# Patient Record
Sex: Male | Born: 1950 | Race: White | Hispanic: No | Smoking: Former smoker
Health system: Southern US, Community
[De-identification: ages and names within clinical notes are randomized; demographics above are authoritative.]

## PROBLEM LIST (undated history)

## (undated) DIAGNOSIS — R945 Abnormal results of liver function studies: Secondary | ICD-10-CM

## (undated) DIAGNOSIS — M109 Gout, unspecified: Secondary | ICD-10-CM

## (undated) DIAGNOSIS — E291 Testicular hypofunction: Secondary | ICD-10-CM

## (undated) DIAGNOSIS — G4730 Sleep apnea, unspecified: Secondary | ICD-10-CM

## (undated) DIAGNOSIS — I10 Essential (primary) hypertension: Secondary | ICD-10-CM

## (undated) DIAGNOSIS — M1990 Unspecified osteoarthritis, unspecified site: Secondary | ICD-10-CM

## (undated) DIAGNOSIS — E119 Type 2 diabetes mellitus without complications: Secondary | ICD-10-CM

## (undated) DIAGNOSIS — R7989 Other specified abnormal findings of blood chemistry: Secondary | ICD-10-CM

## (undated) DIAGNOSIS — E785 Hyperlipidemia, unspecified: Secondary | ICD-10-CM

## (undated) DIAGNOSIS — N529 Male erectile dysfunction, unspecified: Secondary | ICD-10-CM

## (undated) DIAGNOSIS — H9122 Sudden idiopathic hearing loss, left ear: Secondary | ICD-10-CM

## (undated) HISTORY — DX: Hyperlipidemia, unspecified: E78.5

## (undated) HISTORY — DX: Abnormal results of liver function studies: R94.5

## (undated) HISTORY — DX: Essential (primary) hypertension: I10

## (undated) HISTORY — DX: Sudden idiopathic hearing loss, left ear: H91.22

## (undated) HISTORY — DX: Other specified abnormal findings of blood chemistry: R79.89

## (undated) HISTORY — DX: Unspecified osteoarthritis, unspecified site: M19.90

## (undated) HISTORY — DX: Male erectile dysfunction, unspecified: N52.9

## (undated) HISTORY — DX: Testicular hypofunction: E29.1

## (undated) HISTORY — DX: Gout, unspecified: M10.9

---

## 2003-02-03 HISTORY — PX: TOTAL KNEE ARTHROPLASTY: SHX125

## 2005-04-21 ENCOUNTER — Inpatient Hospital Stay (HOSPITAL_COMMUNITY): Admission: RE | Admit: 2005-04-21 | Discharge: 2005-04-24 | Payer: Self-pay | Admitting: Specialist

## 2008-05-02 ENCOUNTER — Encounter: Admission: RE | Admit: 2008-05-02 | Discharge: 2008-05-02 | Payer: Self-pay | Admitting: Endocrinology

## 2010-06-20 NOTE — Op Note (Signed)
NAMEACEN, CRAUN NO.:  [id number]   MEDICAL RECORD NO.:  [id number]          PATIENT TYPE:  INP   LOCATION:  0006                         FACILITY:  [facility]   PHYSICIAN:  Erasmo Leventhal, M.D.DATE OF BIRTH:  [birth year]   DATE OF PROCEDURE:  04/21/2005  DATE OF DISCHARGE:                                 OPERATIVE REPORT   PREOPERATIVE DIAGNOSIS:  Right knee end-stage osteoarthritis.   POSTOPERATIVE DIAGNOSIS:  Right knee end-stage osteoarthritis.   PROCEDURE:  Right total knee arthroplasty.   SURGEON:  Erasmo Leventhal, M.D.   ASSISTANT:  Jaquelyn Bitter. Chabon, PA-C.   ANESTHESIA:  Spinal.   ESTIMATED BLOOD LOSS:  Less than 100 mL.   DRAINS:  Two medium hemovac.   COMPLICATIONS:  None.   TOURNIQUET TIME:  1 hour and 20 minutes at 300 mmHg.   COMPLICATIONS:  None.   DISPOSITION:  PACU stable.   OPERATIVE IMPLANTS:  [employer], all cemented. Size 5 femur,  size 5 tibia, 10 mm posterior stabilized rotating platform tibial insert, a  35 mm patella.   DESCRIPTION OF PROCEDURE:  The patient was counseled in the holding area,  correct side was identified, IV was started, antibiotics were given. Taken  to the OR where the spinal anesthetic was administered. A Foley catheter was  placed utilizing sterile technique by the OR circulating nurse. All  extremities well padded and bumped. The right extremity was elevated. He had  a 7 degree flexion contracture, he could only flex to 110 degrees. He was  elevated, prepped with DuraPrep, draped in a sterile fashion. Exsanguinated  with Esmarch, tourniquet was inflated to 300 mmHg. A straight midline  incision was made in the skin and subcutaneous tissue, medial and lateral  soft tissue flaps were developed at the appropriate level. Medial  parapatellar arthrotomy was performed, proximal medial tibial soft tissue  release was done. The patella was retracted out the way, knee flexed, end-  stage arthritic changes and a very tight knee. The cruciate ligaments were  resected, a starting hole made in the distal femur, canal was irrigated,  effluent was clear. The intramedullary rod was gently placed. We chose a 5  degree valgus cut and an 11 mm cut off the distal femur. The distal femur  was found to be a size 5, rotation marks were made, distal femur was cut to  fit a size 5. Medial and lateral menisci were removed, geniculate vessels  were coagulated, posterior neurovascular structures were thought of and  protected throughout the entire case. Tibial eminence was resected, proximal  tibia was found to be a size 5, __________ was made, step reamer utilized,  canal was irrigated, effluent was clear. The intramedullary rod was gently  placed and chose a 0 degree slope with a 10 mm cut based upon the lateral  side. Posteromedial, posterolateral femoral osteophytes removed under direct  visualized this time with a flexion extension block of 10 mm and we had  excellent gaps and stability. The knee was then flexed up, tibial base plate  was applied, got good coverage.  The reamer and punch was utilized. A femoral  box cut was prepared in standard fashion. At this time with a size 5 femur,  size 5 tibia, 10 mm insert with excellent range of motion and soft tissue  balance, varus and valgus stress, flexion extension gaps. The patella was  found to be a size 35, appropriate amount of bone is removed, locking holes  were made. At this time with a 35 patella, we had anatomic patellofemoral  tracking and he looked excellent. All trials were then removed. The knee was  copiously irrigated with pulsatile lavage while the cement was mixed.  Utilizing modern cement technique, all components were cemented into place.  A size 5 tibia, size 5 femur, 35 patella with a 10 insert. After the cement  had cured, the 10 insert was removed, excess cement was removed, knee was  again irrigated with  pulsatile lavage and the final 10 mm posterior  stabilized rotating platform tibial insert was implanted. We then reduced  the knee, we had excellent varus and valgus stability, patellofemoral  tracking was anatomic and excellent flexion extension. Two medium hemovac  drains were placed, sequential closure of the layers was done, arthrotomy  Vicryl, subcu Vicryl, skin closed with a subcuticular Monocryl sutures. The  drains were hooked to suction, Steri-Strips were applied, sterile dressing  applied, tourniquet deflated, normal circulation in the foot and ankle at  the end of the case. An ice pack was applied, knee immobilizer in full  extension. Sponge and needle count correct. No complications or problems.  The patient tolerated the procedure well. He was then gently awakened and he  was taken from the operating room to the PACU in stable condition.   To help with surgical technique, retraction and decision making, Mr. Leilani Able, PA-C assistance was needed throughout the entire case.           ______________________________  Erasmo Leventhal, M.D.     RAC/MEDQ  D:  04/21/2005  T:  04/22/2005  Job:  [id number]

## 2010-06-20 NOTE — H&P (Signed)
Jimmy Valentine, Jimmy Valentine NO.:  [id number]   MEDICAL RECORD NO.:  [id number]          PATIENT TYPE:  INP   LOCATION:  NA                           FACILITY:  [facility]   PHYSICIAN:  Ellwood Dense, M.D.   DATE OF BIRTH:  May 01, 1950   DATE OF ADMISSION:  04/21/2005  DATE OF DISCHARGE:                                HISTORY & PHYSICAL   CHIEF COMPLAINT:  Right knee end-stage osteoarthritis.   HISTORY OF PRESENT ILLNESS:  This is a 60 year old gentleman with a history  of end-stage osteoarthritis of his right knee who has failed conservative  management to keep his symptoms under control. Now due to failure to  conservative treatment the patient is scheduled for total knee arthroplasty  of his right knee. The surgery risks, benefits and after care were discussed  in detail with the patient. Questions invited and answered and surgery  scheduled. He has received medical clearance from his medical physician, Dr.  Corrin Parker.   PAST MEDICAL HISTORY:   ALLERGIES:  No drug allergies.   CURRENT MEDICATIONS:  1.  Crestor 10 mg one p.o. q.p.m.  2.  Norvasc 5 mg one p.o. daily.  3.  Diovan 320 mg one p.o. daily.  4.  Maxzide 25 mg 1/2 tablet daily.   PREVIOUS SURGERY:  Right knee arthroscopy.   SERIOUS MEDICAL ILLNESSES:  Hypertension and hypercholesterolemia.   FAMILY HISTORY:  Positive for hypertension and diabetes.   SOCIAL HISTORY:  The patient is married. He works as a Medical illustrator. He does not  drink. He drinks 2 beers a day.   REVIEW OF SYSTEMS:  Negative for high blood pressure. Pulmonary - no  shortness of breath, PND, orthopnea. Cardiovascular negative for chest pain  or palpitations. GI - negative for ulcers, hepatitis. GU - negative for  urinary tract difficulty. Musculoskeletal - positive as in the history of  present illness.   PHYSICAL EXAMINATION:  VITAL SIGNS:  Blood pressure 124/88, pulse 72 and  regular, respirations 16.  GENERAL APPEARANCE:  This  is a well-developed, well-nourished gentleman in  no acute distress.  HEENT:  Head normocephalic, atraumatic. Nose patent, nares patent. Pupils  are equal, round and reactive to light. Throat without injection.  NECK:  Supple without adenopathy. Carotid 2+ without bruits.  CHEST:  Clear to auscultation, no rales, rhonchi. Respiration 16.  HEART:  Regular rate and rhythm at 72 beats per minute without murmur.  ABDOMEN:  Soft with active bowel sounds. No mass or organomegaly.  NEUROLOGIC:  Patient alert and oriented to time, place and person. Cranial  nerves II-XII grossly intact.  EXTREMITIES:  Within normal limits with exception of the right knee which  shows a 5 degree flexion contracture, further flexion to 95 degrees with  pain. He has a varus deformity. Dorsalis pedis and posterior tibialis pulses  are 2+. Sensation and circulation are intact.   X-rays show end-stage osteoarthritis of the right knee.   IMPRESSION:  End stage osteoarthritis of the right knee.   PLAN:  Total knee arthroplasty, right knee.      Jaquelyn Bitter. Chabon, P.A.  ______________________________  Ellwood Dense, M.D.    SJC/MEDQ  D:  04/17/2005  T:  04/18/2005  Job:  [id number]   cc:   Alfonse Alpers. Dagoberto Ligas, M.D.  Fax: [phone]

## 2010-06-20 NOTE — Discharge Summary (Signed)
Jimmy Valentine, Jimmy Valentine NO.:  [id number]   MEDICAL RECORD NO.:  [id number]          PATIENT TYPE:  INP   LOCATION:  1517                         FACILITY:  [facility]   PHYSICIAN:  Erasmo Leventhal, M.D.DATE OF BIRTH:  07/28/1950   DATE OF ADMISSION:  04/21/2005  DATE OF DISCHARGE:  04/24/2005                                 DISCHARGE SUMMARY   ADMITTING DIAGNOSIS:  End-stage osteoarthritis, right knee.   DISEASE DIAGNOSIS:  End-stage osteoarthritis, right knee.   OPERATION:  Total knee arthroplasty of right knee.   BRIEF HISTORY:  This is a 60 year old gentleman, well known to [place] with a  history of end-stage osteoarthritis of his right knee that has failed  conservative measures and after discussion of treatment options, now  scheduled for total knee arthroplasty.  The surgery, its benefits, and after  care were discussed in detail and the patient's questions invited and  answered.   LABORATORY VALUES:  Admission CBC within normal limits.  Admission PT/PTT  within normal limits.  His hemoglobin reached a low of 12.9 and 37.3 on the  22nd.  On the date of discharge, hemoglobin was 13.1 and hematocrit 38.9.  White count was mildly elevated back down to 11.2 at discharge.  Initial  CMET showed the AST and ALT mildly elevated at 42 and 61.  Repeats on the  23rd showed it to be within normal limits.  PT/INR on date of discharge  showed PT 15.9 with INR 1.3.   HOSPITAL COURSE:  The patient tolerated the operative procedure well.  Postoperatively and throughout his postop course, he remained afebrile.  Vital signs were stable.  First postoperative day, lungs were clear, bowel  sounds sluggish, heart sounds normal.  Drain was removed without difficulty,  and neurovascular status was intact in the foot.  The 2nd postoperative day,  dressing was changed, wound was benign, calves were negative, lungs clear,  heart sounds normal, bowel sounds sluggish.  His PCA and IV  were  discontinued, and therapy and CPM were begun.  Third postoperative day, he  was doing extremely well, had good range of motion, minimal discomfort.  Calves were negative.  Wound was benign.  Lungs were clear.  Bowel sounds  active, and he had had a bowel movement.  His INR, however, was not  responding quickly.  His PT was 15.9 with INR of 1.3.  He was started on  Lovenox 41 q.24h. subcu on the 2nd postoperative day, and he will continue  on that on outpatient basis until his INR is greater than 2.0.  And on the  3rd postoperative day, in stable condition with vital signs stable, wound  benign, calves negative, and feeling good, he was discharged home for follow  up in the office.   CONDITION ON DISCHARGE:  Improved.   DISCHARGE MEDICATIONS:  1.  Percocet 1-2 q.4-6h. p.r.n. pain.  2.  Robaxin 500, 1 p.o. q.8h. p.r.n. spasm.  3.  Coumadin per pharmacy protocol.  4.  Lovenox 40 mg subcu 1 shot q.24h. until Coumadin INR is greater than      2.0.  FOLLOW UP:  With Dr. Thomasena Edis in 2 weeks.   DISCHARGE INSTRUCTIONS:  He is to do his exercises at home and call if any  problems or questions arise.      Jaquelyn Bitter. Chabon, P.A.    ______________________________  Erasmo Leventhal, M.D.    SJC/MEDQ  D:  04/24/2005  T:  04/25/2005  Job:  [id number]

## 2010-12-04 ENCOUNTER — Encounter: Payer: Self-pay | Admitting: Internal Medicine

## 2010-12-04 ENCOUNTER — Ambulatory Visit (INDEPENDENT_AMBULATORY_CARE_PROVIDER_SITE_OTHER): Payer: 59 | Admitting: Internal Medicine

## 2010-12-04 VITALS — BP 140/92 | HR 72 | Temp 98.3°F | Resp 18 | Ht 69.0 in | Wt 253.4 lb

## 2010-12-04 DIAGNOSIS — N529 Male erectile dysfunction, unspecified: Secondary | ICD-10-CM | POA: Insufficient documentation

## 2010-12-04 DIAGNOSIS — E291 Testicular hypofunction: Secondary | ICD-10-CM | POA: Insufficient documentation

## 2010-12-04 DIAGNOSIS — Z23 Encounter for immunization: Secondary | ICD-10-CM

## 2010-12-04 DIAGNOSIS — I10 Essential (primary) hypertension: Secondary | ICD-10-CM | POA: Insufficient documentation

## 2010-12-04 DIAGNOSIS — Z0000 Encounter for general adult medical examination without abnormal findings: Secondary | ICD-10-CM | POA: Insufficient documentation

## 2010-12-04 DIAGNOSIS — E785 Hyperlipidemia, unspecified: Secondary | ICD-10-CM | POA: Insufficient documentation

## 2010-12-04 LAB — BASIC METABOLIC PANEL
Creatinine, Ser: 0.7 mg/dL (ref 0.4–1.5)
Potassium: 4.5 mEq/L (ref 3.5–5.1)

## 2010-12-04 MED ORDER — SIMVASTATIN 40 MG PO TABS: 40.0000 mg | ORAL_TABLET | Freq: Every day | ORAL | Status: DC

## 2010-12-04 MED ORDER — AMLODIPINE BESYLATE 10 MG PO TABS: 10.0000 mg | ORAL_TABLET | Freq: Every day | ORAL | Status: DC

## 2010-12-04 MED ORDER — TRIAMTERENE-HCTZ 37.5-25 MG PO TABS: 0.5000 | ORAL_TABLET | Freq: Every day | ORAL | Status: DC

## 2010-12-04 MED ORDER — SILDENAFIL CITRATE 100 MG PO TABS: 100.0000 mg | ORAL_TABLET | Freq: Every day | ORAL | Status: DC | PRN

## 2010-12-04 MED ORDER — VALSARTAN 320 MG PO TABS: 320.0000 mg | ORAL_TABLET | Freq: Every day | ORAL | Status: DC

## 2010-12-04 NOTE — Assessment & Plan Note (Addendum)
In the past tried a gel , reports it didn't get his levels up and did not corrected his mild fatigue and slt decreased libido He also tried a patch but self d/c d/t rash and back pain Told pt that sometimes fatigue and low libido are not necessarily due to low testosterone thus may not be corrected w/ HRT Plan: get records

## 2010-12-04 NOTE — Assessment & Plan Note (Signed)
On viagra prn, it works "sometimes" RF

## 2010-12-04 NOTE — Patient Instructions (Signed)
Please get records from your previous MD: labs and office visit from the last 2 years. Also XRs, procedures, colonoscopies, EKGs

## 2010-12-04 NOTE — Assessment & Plan Note (Signed)
Will get records [facility]

## 2010-12-04 NOTE — Progress Notes (Signed)
  Subjective:    Patient ID: Jimmy Valentine, male    DOB: [birth year], 60 y.o.   MRN: [id number]  HPI New patient, his previous PCP Dr Jens Som retired Hypertension, good ambulatory control, and good medication compliance. Cholesterol--good medication compliance. History of low testosterone, see assessment and plan.  Past Medical History  Diagnosis Date  . Hypertension     dx in the 82s  . Hyperlipidemia     dx in the 90s  . Hypogonadism male   . Erectile dysfunction    Past Surgical History  Procedure Date  . Replacement total knee     R , 2007   History   Social History  . Marital Status: Single    Spouse Name: N/A    Number of Children: 0  . Years of Education: N/A   Occupational History  . sales rep    Social History Main Topics  . Smoking status: Former Smoker    Quit date: 12/04/1995  . Smokeless tobacco: Never Used  . Alcohol Use: 6.0 oz/week    12 drink(s) per week     social drinker  . Drug Use: No  . Sexually Active: Not Currently   Other Topics Concern  . Not on file   Social History Narrative   Married, 2 step kids    Family History  Problem Relation Age of Onset  . Hypertension      mother   . Diabetes      sister   . Colon cancer Neg Hx   . Prostate cancer Neg Hx   . Coronary artery disease      M CABG at age 50s     Review of Systems No chest pain or shortness of breath No nausea, vomiting, diarrhea.     Objective:   Physical Exam  Constitutional: He is oriented to person, place, and time. He appears well-developed. No distress.       Central obesity noted  Cardiovascular: Normal rate, regular rhythm and normal heart sounds.   No murmur heard. Pulmonary/Chest: Effort normal and breath sounds normal. No respiratory distress. He has no wheezes. He has no rales.  Musculoskeletal: He exhibits no edema.  Neurological: He is alert and oriented to person, place, and time.  Skin: He is not diaphoretic.  Psychiatric: He has a normal mood  and affect. His behavior is normal. Judgment and thought content normal.      Assessment & Plan:

## 2010-12-04 NOTE — Assessment & Plan Note (Signed)
Seems well controlled, labs, RFs Will get records

## 2010-12-04 NOTE — Assessment & Plan Note (Signed)
Flu shot today Had a shingles shot before per pt  Never had a pneumonia shot

## 2010-12-08 ENCOUNTER — Other Ambulatory Visit: Payer: Self-pay

## 2010-12-08 MED ORDER — AMLODIPINE BESYLATE 10 MG PO TABS: 10.0000 mg | ORAL_TABLET | Freq: Every day | ORAL | Status: DC

## 2010-12-09 ENCOUNTER — Telehealth: Payer: Self-pay

## 2010-12-09 NOTE — Telephone Encounter (Signed)
Left message concerning lab results also left a call back number to be contacted.

## 2010-12-09 NOTE — Telephone Encounter (Signed)
Message copied by Francisco Capuchin on Tue Dec 09, 2010  9:55 AM ------      Message from: Willow Ora E      Created: Sun Dec 07, 2010 10:48 AM       Non fasting labs, advise patient:       K + normal , good results

## 2010-12-10 ENCOUNTER — Telehealth: Payer: Self-pay

## 2010-12-10 NOTE — Telephone Encounter (Signed)
Message copied by Beverely Low on Wed Dec 10, 2010  8:56 AM ------      Message from: Jimmy Valentine      Created: Sun Dec 07, 2010 10:48 AM       Non fasting labs, advise patient:       K + normal , good results

## 2010-12-10 NOTE — Telephone Encounter (Signed)
Left message on personally identified voicemail to notify pt labs normal and copy mailed. Advised pt to call with questions or concerns

## 2011-01-14 ENCOUNTER — Telehealth: Payer: Self-pay | Admitting: Internal Medicine

## 2011-01-14 NOTE — Telephone Encounter (Signed)
A number of old records were reviewed today. History of abnormal LFTs on and off, levels varies from the 70s to normal. I don't see an abdominal ultrasound or hepatitis serology. History of   hypogonadism. MRI of the brain 2010 normal. On HRT.

## 2011-03-06 ENCOUNTER — Ambulatory Visit (INDEPENDENT_AMBULATORY_CARE_PROVIDER_SITE_OTHER): Payer: 59 | Admitting: Internal Medicine

## 2011-03-06 ENCOUNTER — Encounter: Payer: Self-pay | Admitting: Internal Medicine

## 2011-03-06 DIAGNOSIS — R5383 Other fatigue: Secondary | ICD-10-CM

## 2011-03-06 DIAGNOSIS — Z0000 Encounter for general adult medical examination without abnormal findings: Secondary | ICD-10-CM

## 2011-03-06 DIAGNOSIS — E291 Testicular hypofunction: Secondary | ICD-10-CM

## 2011-03-06 NOTE — Progress Notes (Signed)
  Subjective:    Patient ID: Jimmy Valentine, male    DOB: 06/04/1950, 61 y.o.   MRN: [id number]  HPI Complete physical exam  Past medical history Hypertension dx in the 41 Hyperlipidemia dx in the 90s increase LFTs on and off, mild Secondary hypogonadism,had a MRI, was prescribed testosterone by endocrinology erectal dysfunction  Past surgical history Right total knee replacement 2007  Social history Married, 2 stepchildren Occupation Tax adviser Quit tobacco 1997 Alcohol socially No drug use  Family history Hypertension-- mother Diabetes -- sister. Heart disease -- had a CABG and age 59 Colon cancer --no Prostate cancer-- no   Review of Systems No fever or chills No chest pain or shortness of breath No nausea, vomiting, diarrhea. No blood in the stools. occ  nocturia without gross hematuria. No anxiety depression. Wonders about sleep apnea, he snores and wakes up frequently  but denies falling asleep inappropriately. He does feel quite fatigued  Continue with the present needle on     Objective:   Physical Exam  Constitutional: He is oriented to person, place, and time. He appears well-developed. No distress.       Central obesity  Neck: No thyromegaly present.  Cardiovascular: Normal rate, regular rhythm, normal heart sounds and intact distal pulses.   No murmur heard. Pulmonary/Chest: Effort normal and breath sounds normal. No respiratory distress. He has no wheezes. He has no rales.  Abdominal: Soft. Bowel sounds are normal. He exhibits no distension. There is no tenderness. There is no rebound and no guarding.  Genitourinary: Rectum normal and prostate normal.  Musculoskeletal: He exhibits no edema.  Neurological: He is alert and oriented to person, place, and time.  Skin: He is not diaphoretic.  Psychiatric: He has a normal mood and affect. His behavior is normal. Judgment and thought content normal.      Assessment & Plan:

## 2011-03-06 NOTE — Assessment & Plan Note (Signed)
Chart reviewed, his endocrinologist diagnosed with with secondary hypogonadism based on [facility], LH and brain MRI. Was prescribed testosterone. In the past tried a gel , reports it didn't get his levels up and did not corrected his mild fatigue and slt decreased libido He also tried a patch but self d/c d/t rash and back pain Plan: labs

## 2011-03-06 NOTE — Assessment & Plan Note (Addendum)
Last TD several years ago Had a Flu shot   2011 shingles shot   2011 pneumonia shot  Cscope at age 61, normal, Dr Candelaria Stagers, next 10 years. iFOB today Wonders about OSA, he has quite a bit of fatigue. Will test Labs Diet-exercise discussed

## 2011-03-06 NOTE — Patient Instructions (Signed)
Please come fasting: FLP, CMP, TSH, CBC, TSH--- dx V70  total and free testosterone--- dx hypogonadism

## 2011-03-10 ENCOUNTER — Other Ambulatory Visit: Payer: Self-pay | Admitting: Internal Medicine

## 2011-03-10 DIAGNOSIS — Z0000 Encounter for general adult medical examination without abnormal findings: Secondary | ICD-10-CM

## 2011-03-10 DIAGNOSIS — E291 Testicular hypofunction: Secondary | ICD-10-CM

## 2011-03-11 ENCOUNTER — Encounter: Payer: Self-pay | Admitting: *Deleted

## 2011-03-11 ENCOUNTER — Other Ambulatory Visit (INDEPENDENT_AMBULATORY_CARE_PROVIDER_SITE_OTHER): Payer: 59

## 2011-03-11 DIAGNOSIS — E291 Testicular hypofunction: Secondary | ICD-10-CM

## 2011-03-11 DIAGNOSIS — Z0000 Encounter for general adult medical examination without abnormal findings: Secondary | ICD-10-CM

## 2011-03-11 LAB — LIPID PANEL
LDL Cholesterol: 90 mg/dL (ref 0–99)
Total CHOL/HDL Ratio: 4
Triglycerides: 132 mg/dL (ref 0.0–149.0)
VLDL: 26.4 mg/dL (ref 0.0–40.0)

## 2011-03-11 LAB — COMPREHENSIVE METABOLIC PANEL
ALT: 39 U/L (ref 0–53)
BUN: 23 mg/dL (ref 6–23)
CO2: 26 mEq/L (ref 19–32)
Calcium: 9.4 mg/dL (ref 8.4–10.5)
Creatinine, Ser: 1.1 mg/dL (ref 0.4–1.5)
GFR: 68.94 mL/min (ref >60.00)
Glucose, Bld: 125 mg/dL — ABNORMAL HIGH (ref 70–99)
Sodium: 140 mEq/L (ref 135–145)
Total Bilirubin: 1 mg/dL (ref 0.3–1.2)
Total Protein: 7.4 g/dL (ref 6.0–8.3)

## 2011-03-11 LAB — CBC WITH DIFFERENTIAL/PLATELET
Basophils Absolute: 0 10*3/uL (ref 0.0–0.1)
Eosinophils Relative: 3.9 % (ref 0.0–5.0)
Lymphs Abs: 2.6 10*3/uL (ref 0.7–4.0)
MCHC: 35.2 g/dL (ref 30.0–36.0)
MCV: 96.3 fl (ref 78.0–100.0)
Monocytes Absolute: 1 10*3/uL (ref 0.1–1.0)
Neutro Abs: 6.9 10*3/uL (ref 1.4–7.7)
WBC: 10.9 10*3/uL — ABNORMAL HIGH (ref 4.5–10.5)

## 2011-03-11 LAB — TSH: TSH: 2.58 u[IU]/mL (ref 0.35–5.50)

## 2011-03-12 ENCOUNTER — Other Ambulatory Visit: Payer: Self-pay | Admitting: Internal Medicine

## 2011-03-12 DIAGNOSIS — G4733 Obstructive sleep apnea (adult) (pediatric): Secondary | ICD-10-CM

## 2011-03-12 LAB — TESTOSTERONE, FREE, TOTAL, SHBG: Testosterone: 151.47 ng/dL — ABNORMAL LOW (ref 250–890)

## 2011-03-12 NOTE — Progress Notes (Signed)
Addended by: Edwena Felty T on: 03/12/2011 10:37 AM   Modules accepted: Orders

## 2011-03-18 ENCOUNTER — Telehealth: Payer: Self-pay | Admitting: *Deleted

## 2011-03-18 NOTE — Telephone Encounter (Signed)
Pt had labs on 03/11/11 and has not heard from our office regarding results.  No notes attached to labs with recommendations.  Please advise recommendations and if OK to mail. Thanks

## 2011-03-19 MED ORDER — TESTOSTERONE 40.5 MG/2.5GM (1.62%) TD GEL: 2.0000 | Freq: Every day | TRANSDERMAL | Status: DC

## 2011-03-19 NOTE — Telephone Encounter (Signed)
Spoke with pt, gave lab results & made appt for 4.15.13 @ 11am. Sent in prescription.

## 2011-03-19 NOTE — Telephone Encounter (Signed)
Pt just called back & states that he still has some of the testosterone patches left over. Can he use those or would you prefer the Androgel? Please advise.

## 2011-03-19 NOTE — Telephone Encounter (Signed)
LMOVM to return call.

## 2011-03-19 NOTE — Telephone Encounter (Signed)
Advise patient, Cholesterol is very good, blood sugar and white cells are slightly elevated.Testosterone is low. Plan:  Restart AndroGel use it early every AM, see prescription Come back for blood work in 2 months,blood tests needs to be done at around 11 AM: Total and free testosterone---- DX hypogonadism A1c ----DX hyperglycemia

## 2011-03-20 NOTE — Telephone Encounter (Signed)
rec to try androgel, give pt a discount card if available

## 2011-03-20 NOTE — Telephone Encounter (Signed)
Spoke with pt & sent lab results upon request.

## 2011-03-24 ENCOUNTER — Other Ambulatory Visit: Payer: Self-pay | Admitting: *Deleted

## 2011-03-24 MED ORDER — TESTOSTERONE 40.5 MG/2.5GM (1.62%) TD GEL: 2.0000 | Freq: Every day | TRANSDERMAL | Status: DC

## 2011-03-24 NOTE — Telephone Encounter (Signed)
Rx sent 

## 2011-03-26 ENCOUNTER — Other Ambulatory Visit: Payer: Self-pay | Admitting: Internal Medicine

## 2011-03-26 DIAGNOSIS — G4730 Sleep apnea, unspecified: Secondary | ICD-10-CM

## 2011-03-26 DIAGNOSIS — R5383 Other fatigue: Secondary | ICD-10-CM

## 2011-03-27 ENCOUNTER — Other Ambulatory Visit: Payer: Self-pay | Admitting: Internal Medicine

## 2011-03-31 ENCOUNTER — Telehealth: Payer: Self-pay | Admitting: *Deleted

## 2011-03-31 NOTE — Telephone Encounter (Signed)
Prior auth denied for Androgel preferred med Testim.Please advise

## 2011-04-02 NOTE — Telephone Encounter (Signed)
Left message to call office

## 2011-04-02 NOTE — Telephone Encounter (Signed)
I put a prescription for Testim, please complete the prescription and send it to whatever pharmacy he desires. Come back for blood work in 2 months,blood tests needs to be done at around 11 AM:  Total and free testosterone---- DX hypogonadism  A1c ----DX hyperglycemia

## 2011-04-03 ENCOUNTER — Telehealth: Payer: Self-pay | Admitting: Internal Medicine

## 2011-04-03 ENCOUNTER — Ambulatory Visit (INDEPENDENT_AMBULATORY_CARE_PROVIDER_SITE_OTHER): Payer: 59 | Admitting: Pulmonary Disease

## 2011-04-03 DIAGNOSIS — G4733 Obstructive sleep apnea (adult) (pediatric): Secondary | ICD-10-CM

## 2011-04-03 MED ORDER — TESTOSTERONE 50 MG/5GM (1%) TD GEL: 5.0000 g | Freq: Every day | TRANSDERMAL | Status: DC

## 2011-04-03 NOTE — Telephone Encounter (Signed)
Addended by: Candie Echevaria L on: 04/03/2011 08:51 AM   Modules accepted: Orders

## 2011-04-03 NOTE — Telephone Encounter (Signed)
Discuss with patient, Rx sent. 

## 2011-04-03 NOTE — Telephone Encounter (Signed)
Pt returned your call. Ph # [phone]

## 2011-04-03 NOTE — Telephone Encounter (Signed)
Verbally clarified Rx on phone with pharmacy.

## 2011-04-03 NOTE — Telephone Encounter (Signed)
[facility] faxed a clarification request for the prescription below:  testosterone (ANDROGEL) 50 MG/5GM GEL 5 Package 2 04/02/2011 04/03/2011 Sig - Route: Place 5 g onto the skin daily. - Transdermal Class: Print Authorizing Provider: Wanda Plump, MD Ordering User: Candie Echevaria, CMA  Needs to clarfiy package size is 30 tubes of 5-grams  Please send new RX to [phone]

## 2011-04-06 NOTE — Progress Notes (Signed)
Addended by: Edwena Felty T on: 04/06/2011 04:36 PM   Modules accepted: Orders

## 2011-04-22 ENCOUNTER — Encounter: Payer: Self-pay | Admitting: Pulmonary Disease

## 2011-04-22 ENCOUNTER — Ambulatory Visit (INDEPENDENT_AMBULATORY_CARE_PROVIDER_SITE_OTHER): Payer: 59 | Admitting: Pulmonary Disease

## 2011-04-22 VITALS — BP 152/70 | HR 81 | Temp 98.5°F | Ht 69.0 in | Wt 254.0 lb

## 2011-04-22 DIAGNOSIS — G4733 Obstructive sleep apnea (adult) (pediatric): Secondary | ICD-10-CM

## 2011-04-22 NOTE — Progress Notes (Signed)
  Subjective:    Patient ID: Jimmy Valentine, male    DOB: 1950-09-20, 61 y.o.   MRN: [id number]  HPI The patient is a 61 year old male who been asked to see for management of obstructive sleep apnea.  He recently underwent sleep testing where he was found to have an AHI of 11 events per hour based on a total monitoring time of 9 hours.  However, the patient tells me that he slept approximately 7 hours a night, giving him an actual AHI of 14 events per hour.  He has been noted to have loud snoring as well as an abnormal breathing pattern during sleep by his wife.  He does have frequent awakenings at night, but feels fairly rested in the mornings upon arising.  He denies any alert and is concentration issues during the day, as well as inappropriate daytime sleepiness.  He has no issues watching television or movies in the evening because of sleepiness.  He denies any sleepiness with driving, and drives long distances weekly.  The patient states that his weight is neutral over the last 2 years.  Sleep Questionnaire: What time do you typically go to bed?( Between what hours) 8:30 to 10:00 pm How long does it take you to fall asleep? 15 mins to 1 hour How many times during the night do you wake up? What time do you get out of bed to start your day? 0530 Do you drive or operate heavy machinery in your occupation? No How much has your weight changed (up or down) over the past two years? (In pounds) 0 oz (0 kg) Have you ever had a sleep study before? Yes If yes, location of study? Home sleep study If yes, date of study? 03/2011 Do you currently use CPAP? No Do you wear oxygen at any time? No    Review of Systems  Constitutional: Negative for fever and unexpected weight change.  HENT: Negative for ear pain, nosebleeds, congestion, sore throat, rhinorrhea, sneezing, trouble swallowing, dental problem, postnasal drip and sinus pressure.   Eyes: Negative for redness and itching.  Respiratory: Negative for cough,  chest tightness, shortness of breath and wheezing.   Cardiovascular: Negative for palpitations and leg swelling.  Gastrointestinal: Negative for nausea and vomiting.  Genitourinary: Negative for dysuria.  Musculoskeletal: Negative for joint swelling.  Skin: Negative for rash.  Neurological: Negative for headaches.  Hematological: Does not bruise/bleed easily.  Psychiatric/Behavioral: Negative for dysphoric mood. The patient is not nervous/anxious.        Objective:   Physical Exam Constitutional:  Obese male, no acute distress  HENT:  Nares patent without discharge, some turbinate hypertrophy  Oropharynx without exudate, palate and uvula are thick and elongated.   Eyes:  Perrla, eomi, no scleral icterus  Neck:  No JVD, no TMG  Cardiovascular:  Normal rate, regular rhythm, no rubs or gallops.  1/6 sem        Intact distal pulses  Pulmonary :  Normal breath sounds, no stridor or respiratory distress   No rales, rhonchi, or wheezing  Abdominal:  Soft, nondistended, bowel sounds present.  No tenderness noted.   Musculoskeletal:  1+ lower extremity edema noted.  Lymph Nodes:  No cervical lymphadenopathy noted  Skin:  No cyanosis noted  Neurologic:  Alert, appropriate, moves all 4 extremities without obvious deficit.         Assessment & Plan:

## 2011-04-22 NOTE — Patient Instructions (Signed)
Consider treatments for your sleep apnea:  Trial of weight loss alone vs. Trying dental appliance or cpap while trying to work on weight loss. Discuss with wife, and let me know what you decide.

## 2011-04-22 NOTE — Assessment & Plan Note (Signed)
The patient has mild obstructive sleep apnea based on his recent sleep testing.  Although he snores and has had witnessed apneas by his wife, he feels rested in the mornings upon arising and denies any sleepiness issues during the day.  He does have frequent awakenings at night but I suspect is related to his sleep disordered breathing.  I have reviewed the various reasons to treat sleep apnea, including impact to both his wife's and his quality of life, as well as cardiovascular health.  His mild degree of sleep apnea is really not a significant health risk for him, and therefore his decision to treat should be based on its impact to his and his wife's quality of life.  I have outlined a trial of weight loss alone, or to consider more aggressive treatment with dental appliance or CPAP.  He will discuss with his wife and let me know his decision.

## 2011-04-30 ENCOUNTER — Other Ambulatory Visit: Payer: Self-pay | Admitting: *Deleted

## 2011-04-30 DIAGNOSIS — Z0000 Encounter for general adult medical examination without abnormal findings: Secondary | ICD-10-CM

## 2011-05-18 ENCOUNTER — Other Ambulatory Visit: Payer: 59

## 2011-06-03 ENCOUNTER — Other Ambulatory Visit: Payer: Self-pay | Admitting: Internal Medicine

## 2011-06-04 NOTE — Telephone Encounter (Signed)
Refill done.  

## 2011-06-17 ENCOUNTER — Other Ambulatory Visit (INDEPENDENT_AMBULATORY_CARE_PROVIDER_SITE_OTHER): Payer: 59

## 2011-06-17 DIAGNOSIS — R739 Hyperglycemia, unspecified: Secondary | ICD-10-CM

## 2011-06-17 DIAGNOSIS — E291 Testicular hypofunction: Secondary | ICD-10-CM

## 2011-06-17 DIAGNOSIS — R7309 Other abnormal glucose: Secondary | ICD-10-CM

## 2011-06-17 LAB — HEMOGLOBIN A1C: Hgb A1c MFr Bld: 6.3 % (ref 4.6–6.5)

## 2011-06-17 NOTE — Progress Notes (Signed)
Labs only

## 2011-06-18 LAB — TESTOSTERONE, FREE, TOTAL, SHBG
Sex Hormone Binding: 28 nmol/L (ref 13–71)
Testosterone: 275.08 ng/dL — ABNORMAL LOW (ref 300–890)

## 2011-06-23 ENCOUNTER — Other Ambulatory Visit: Payer: Self-pay | Admitting: *Deleted

## 2011-06-23 MED ORDER — TESTOSTERONE 50 MG/5GM (1%) TD GEL: 10.0000 g | Freq: Every day | TRANSDERMAL | Status: DC

## 2011-06-30 ENCOUNTER — Other Ambulatory Visit: Payer: Self-pay | Admitting: *Deleted

## 2011-06-30 MED ORDER — TESTOSTERONE 50 MG/5GM (1%) TD GEL: 10.0000 g | Freq: Every day | TRANSDERMAL | Status: DC

## 2011-09-03 ENCOUNTER — Ambulatory Visit (INDEPENDENT_AMBULATORY_CARE_PROVIDER_SITE_OTHER): Payer: 59 | Admitting: Internal Medicine

## 2011-09-03 ENCOUNTER — Encounter: Payer: Self-pay | Admitting: Internal Medicine

## 2011-09-03 VITALS — BP 126/82 | HR 62 | Temp 97.9°F | Wt 243.0 lb

## 2011-09-03 DIAGNOSIS — E119 Type 2 diabetes mellitus without complications: Secondary | ICD-10-CM | POA: Insufficient documentation

## 2011-09-03 DIAGNOSIS — E291 Testicular hypofunction: Secondary | ICD-10-CM

## 2011-09-03 DIAGNOSIS — G4733 Obstructive sleep apnea (adult) (pediatric): Secondary | ICD-10-CM

## 2011-09-03 DIAGNOSIS — I10 Essential (primary) hypertension: Secondary | ICD-10-CM

## 2011-09-03 DIAGNOSIS — R7309 Other abnormal glucose: Secondary | ICD-10-CM

## 2011-09-03 DIAGNOSIS — M109 Gout, unspecified: Secondary | ICD-10-CM

## 2011-09-03 DIAGNOSIS — R739 Hyperglycemia, unspecified: Secondary | ICD-10-CM

## 2011-09-03 HISTORY — DX: Gout, unspecified: M10.9

## 2011-09-03 LAB — BASIC METABOLIC PANEL
BUN: 24 mg/dL — ABNORMAL HIGH (ref 6–23)
Chloride: 102 mEq/L (ref 96–112)
GFR: 73.27 mL/min (ref >60.00)
Potassium: 4.2 mEq/L (ref 3.5–5.1)
Sodium: 138 mEq/L (ref 135–145)

## 2011-09-03 MED ORDER — COLCHICINE 0.6 MG PO TABS: 0.6000 mg | ORAL_TABLET | Freq: Two times a day (BID) | ORAL | Status: DC | PRN

## 2011-09-03 NOTE — Patient Instructions (Addendum)
Check the  blood pressure 2 or 3 times a week, be sure it is between 110/60 and 140/85. If it is consistently higher or lower, let me know  

## 2011-09-03 NOTE — Assessment & Plan Note (Addendum)
Taking AndroGel as prescribed;  subjectively, has not noticed any difference. Wonders if he needs to keep taking this medication . My opinion is that since he's not feeling "better" he does not have to take his medications . Patient states he will finish what he has of androgel  and then quit. Plan:  Check a testosterone level just to see how he does with HRT for future reference. Check a bone density test at some point in the future.

## 2011-09-03 NOTE — Progress Notes (Signed)
  Subjective:    Patient ID: Jimmy Valentine, male    DOB: 1950-02-23, 61 y.o.   MRN: [id number]  HPI Routine followup Hypertension, good medication compliance, no apparent side effects. Hypogonadism, started AndroGel, good compliance, does not feel any different or better. Continue with mild fatigue and decreased libido from time to time. Nothing that disturbs him. Sleep apnea, he saw pulmonary, he elected not to get a CPAP, symptoms are stable. Has  history of gout and colchicine from time to time. Hyperglycemia, last A1c discussed. He already made some changes in his lifestyle   Past medical history Hypertension dx in the 80 Hyperlipidemia dx in the 90s increase LFTs on and off, mild Secondary hypogonadism,had a MRI, was prescribed testosterone by endocrinology erectal dysfunction Gout   Past surgical history Right total knee replacement 2007  Social history Married, 2 stepchildren Occupation Tax adviser Quit tobacco 1997 Alcohol socially No drug use  Family history Hypertension-- mother Diabetes -- sister. Heart disease -- M  had a CABG and age 5 Colon cancer --no Prostate cancer-- no   Review of Systems Denies chest pain or shortness of breath No nausea, vomiting, diarrhea or blood in the stools.     Objective:   Physical Exam  General -- alert, well-developed, and overweight appearing. No apparent distress.  Lungs -- normal respiratory effort, no intercostal retractions, no accessory muscle use, and normal breath sounds.   Heart-- normal rate, regular rhythm, no murmur, and no gallop.   Extremities-- no pretibial edema bilaterally  Neurologic-- alert & oriented X3 and strength normal in all extremities. Psych-- Cognition and judgment appear intact. Alert and cooperative with normal attention span and concentration.  not anxious appearing and not depressed appearing.         Assessment & Plan:

## 2011-09-03 NOTE — Assessment & Plan Note (Signed)
Last hemoglobin A1c discussed, concept of hyperglycemia and A1c discussed. He has already made some lifestyle changes. Plan: Check A1c

## 2011-09-03 NOTE — Assessment & Plan Note (Signed)
Mild sleep apnea, decided not to use CPAP. Snoring is  no better or worse at the present time

## 2011-09-03 NOTE — Assessment & Plan Note (Addendum)
Well-controlled. Last creatinine was 1.1 previous was 0.7.  Plan check a BMP , cont same medicines

## 2011-09-04 LAB — TESTOSTERONE, FREE, TOTAL, SHBG: Testosterone, Free: 27.8 pg/mL — ABNORMAL LOW (ref 47.0–244.0)

## 2011-09-07 ENCOUNTER — Encounter: Payer: Self-pay | Admitting: *Deleted

## 2011-11-19 ENCOUNTER — Encounter: Payer: Self-pay | Admitting: Internal Medicine

## 2011-11-19 ENCOUNTER — Ambulatory Visit (INDEPENDENT_AMBULATORY_CARE_PROVIDER_SITE_OTHER): Payer: 59 | Admitting: Internal Medicine

## 2011-11-19 VITALS — BP 144/82 | HR 68 | Temp 98.1°F | Wt 243.0 lb

## 2011-11-19 DIAGNOSIS — Z23 Encounter for immunization: Secondary | ICD-10-CM

## 2011-11-19 DIAGNOSIS — E291 Testicular hypofunction: Secondary | ICD-10-CM

## 2011-11-19 DIAGNOSIS — I10 Essential (primary) hypertension: Secondary | ICD-10-CM

## 2011-11-19 DIAGNOSIS — Z01818 Encounter for other preprocedural examination: Secondary | ICD-10-CM | POA: Insufficient documentation

## 2011-11-19 DIAGNOSIS — G4733 Obstructive sleep apnea (adult) (pediatric): Secondary | ICD-10-CM

## 2011-11-19 NOTE — Assessment & Plan Note (Signed)
Currently trying to lose weight, actually knee replacement will help him achieve that goal.

## 2011-11-19 NOTE — Patient Instructions (Addendum)
Cleared for surgery, we'll fax the form to your surgeon

## 2011-11-19 NOTE — Assessment & Plan Note (Signed)
Self discontinue testosterone

## 2011-11-19 NOTE — Assessment & Plan Note (Addendum)
61 year old gentleman with history of mild sleep apnea, well-controlled hypertension in need of total knee replacement. He's not had any symptoms that suggest CAD or CHF. EKG today normal sinus rhythm Plan: Clear for surgery, he will need usual preoperative labs

## 2011-11-19 NOTE — Progress Notes (Signed)
  Subjective:    Patient ID: Jimmy Valentine, male    DOB: Sep 13, 1950, 61 y.o.   MRN: [id number]  HPI Surgical clearance, to have a total knee replacement in about 2 weeks. Medications reviewed, he self discontinue testosterone, it was messy to use and he did not feel any difference. Hypertension, good medication compliance, BP today 144/82, typically at home is 155/78.  Past medical history Hypertension dx in the 73 Hyperlipidemia dx in the 90s increase LFTs on and off, mild Secondary hypogonadism,had a MRI, was prescribed testosterone by endocrinology erectal dysfunction Gout   Past surgical history Right total knee replacement 2007  Social history Married, 2 stepchildren Occupation Tax adviser Quit tobacco 1997 Alcohol socially No drug use  Family history Hypertension-- mother Diabetes -- sister. Heart disease -- M  had a CABG and age 38 Colon cancer --no Prostate cancer-- no   Review of Systems Denies chest pain or shortness of breath. No orthopnea Exertion is limited by his knee pain only, he can go up and down the stairs without chest pain or shortness of breath, he was able to walk 2 miles without problems before his knee started to give him trouble.    Objective:   Physical Exam  General -- alert, well-developed, and overweight appearing. No apparent distress.  Neck --no JVD Lungs -- normal respiratory effort, no intercostal retractions, no accessory muscle use, and normal breath sounds.   Heart-- normal rate, regular rhythm, no murmur, and no gallop.   Abdomen--soft, non-tender, no distention, no masses, no HSM, no guarding, and no rigidity.   Extremities-- no pretibial edema bilaterally  Neurologic-- alert & oriented X3 and strength normal in all extremities. Psych-- Cognition and judgment appear intact. Alert and cooperative with normal attention span and concentration.  not anxious appearing and not depressed appearing.      Assessment & Plan:

## 2011-11-19 NOTE — Assessment & Plan Note (Signed)
Slightly elevated today but well-controlled home. No change.   Last BMP normal

## 2011-11-23 ENCOUNTER — Other Ambulatory Visit: Payer: Self-pay | Admitting: Pain Medicine

## 2011-11-26 ENCOUNTER — Encounter (HOSPITAL_COMMUNITY): Payer: Self-pay | Admitting: Pharmacy Technician

## 2011-12-01 ENCOUNTER — Other Ambulatory Visit: Payer: Self-pay | Admitting: Internal Medicine

## 2011-12-01 ENCOUNTER — Encounter (HOSPITAL_COMMUNITY): Payer: Self-pay

## 2011-12-01 ENCOUNTER — Encounter (HOSPITAL_COMMUNITY)
Admission: RE | Admit: 2011-12-01 | Discharge: 2011-12-01 | Disposition: A | Discharge status: Home / Self Care | Payer: 59 | Source: Ambulatory Visit | Attending: Specialist | Admitting: Specialist

## 2011-12-01 ENCOUNTER — Ambulatory Visit (HOSPITAL_COMMUNITY)
Admission: RE | Admit: 2011-12-01 | Discharge: 2011-12-01 | Disposition: A | Discharge status: Home / Self Care | Payer: 59 | Source: Ambulatory Visit | Attending: Anesthesiology | Admitting: Anesthesiology

## 2011-12-01 DIAGNOSIS — Z01818 Encounter for other preprocedural examination: Secondary | ICD-10-CM | POA: Insufficient documentation

## 2011-12-01 DIAGNOSIS — Z01812 Encounter for preprocedural laboratory examination: Secondary | ICD-10-CM | POA: Insufficient documentation

## 2011-12-01 HISTORY — DX: Sleep apnea, unspecified: G47.30

## 2011-12-01 LAB — URINALYSIS, ROUTINE W REFLEX MICROSCOPIC
Bilirubin Urine: NEGATIVE
Leukocytes, UA: NEGATIVE
Nitrite: NEGATIVE
Specific Gravity, Urine: 1.018 (ref 1.005–1.030)
pH: 7 (ref 5.0–8.0)

## 2011-12-01 LAB — COMPREHENSIVE METABOLIC PANEL
AST: 25 U/L (ref 0–37)
Albumin: 4.1 g/dL (ref 3.5–5.2)
Chloride: 101 mEq/L (ref 96–112)
Creatinine, Ser: 0.96 mg/dL (ref 0.50–1.35)
Potassium: 4.4 mEq/L (ref 3.5–5.1)
Total Bilirubin: 0.4 mg/dL (ref 0.3–1.2)
Total Protein: 7.4 g/dL (ref 6.0–8.3)

## 2011-12-01 LAB — SURGICAL PCR SCREEN: MRSA, PCR: NEGATIVE

## 2011-12-01 LAB — CBC WITH DIFFERENTIAL/PLATELET
Basophils Absolute: 0 10*3/uL (ref 0.0–0.1)
Basophils Relative: 0 % (ref 0–1)
MCHC: 35.4 g/dL (ref 30.0–36.0)
Monocytes Absolute: 0.7 10*3/uL (ref 0.1–1.0)
Neutro Abs: 4.6 10*3/uL (ref 1.7–7.7)
Neutrophils Relative %: 54 % (ref 43–77)
RDW: 12.4 % (ref 11.5–15.5)

## 2011-12-01 LAB — PROTIME-INR: INR: 0.94 (ref 0.00–1.49)

## 2011-12-01 LAB — APTT: aPTT: 33 seconds (ref 24–37)

## 2011-12-01 NOTE — Pre-Procedure Instructions (Signed)
20 Boston Cookson  12/01/2011   Your procedure is scheduled on:  Friday  12-04-2011  Report to [facility] at  AM. 1245  Call this number if you have problems the morning of surgery: [phone]   Remember:   Do not eat food:After Midnight Thursday.   May have clear liquids until 0915 Friday morning then nothing by mouth until after surgery      Take these medicines the morning of surgery with A SIP OF WATER:  Norvasc  Do not wear jewelry, make-up or nail polish.  Do not wear lotions, powders, or perfumes. You may wear deodorant.  Do not shave 48 hours prior to surgery. Men may shave face and neck.  Do not bring valuables to the hospital.  Contacts, dentures or bridgework may not be worn into surgery.  Leave suitcase in the car. After surgery it may be brought to your room.  For patients admitted to the hospital, checkout time is 11:00 AM the day of discharge.   Patients discharged the day of surgery will not be allowed to drive home.  Name and phone number of your driver:   See [facility] Preparing for surgery sheet.   Please read over the following fact sheets that you were given: MRSA Information

## 2011-12-01 NOTE — Patient Instructions (Addendum)
Drink  Fluids/water as tolerated over the next 72 hours Tylenol or ibuprofen OTC as directed Continue Calcium and Vit D as directed by your MD 20 Zayquan Bogard  12/01/2011   Your procedure is scheduled on:  Friday  Report to [facility] at  AM.  Call this number if you have problems the morning of surgery: [phone]   Remember:   Do not drink liquids or  eat food:After Midnight.      Take these medicines the morning of surgery with A SIP OF WATER:    Do not wear jewelry, make-up or nail polish.  Do not wear lotions, powders, or perfumes. You may wear deodorant.  Do not shave 48 hours prior to surgery. Men may shave face and neck.  Do not bring valuables to the hospital.  Contacts, dentures or bridgework may not be worn into surgery.  Leave suitcase in the car. After surgery it may be brought to your room.  For patients admitted to the hospital, checkout time is 11:00 AM the day of discharge.   Patients discharged the day of surgery will not be allowed to drive home.  Name and phone number of your driver:   See [facility] Preparing for surgery sheet.   Please read over the following fact sheets that you were given: MRSA Information

## 2011-12-01 NOTE — Pre-Procedure Instructions (Signed)
20 Jimmy Valentine  12/01/2011   Your procedure is scheduled on:  Friday  12-04-2011  Report to [facility] at  AM.  1245  Call this number if you have problems the morning of surgery: [phone]   Remember:   No solid food after midnight Thursday,may have clear liquids until 0915 Friday morning then nothing by mouth until after surgery      Take these medicines the morning of surgery with A SIP OF WATER:  Norvasc   Do not wear jewelry, make-up or nail polish.  Do not wear lotions, powders, or perfumes. You may wear deodorant.  Do not shave 48 hours prior to surgery. Men may shave face and neck.  Do not bring valuables to the hospital.  Contacts, dentures or bridgework may not be worn into surgery.  Leave suitcase in the car. After surgery it may be brought to your room.  For patients admitted to the hospital, checkout time is 11:00 AM the day of discharge.   Patients discharged the day of surgery will not be allowed to drive home.  Name and phone number of your driver:  [name] wife [phone]  See [facility] Preparing for surgery sheet.   Please read over the following fact sheets that you were given: MRSA Information

## 2011-12-01 NOTE — H&P (Signed)
Jimmy Valentine, Jimmy Valentine NO.:  [id number]  MEDICAL RECORD NO.:  [id number]  LOCATION:                                FACILITY:  [facility]  PHYSICIAN:  Erasmo Leventhal, M.D.DATE OF BIRTH:  26-Apr-1950  DATE OF ADMISSION:  12/04/2011 DATE OF DISCHARGE:                             HISTORY & PHYSICAL   CHIEF COMPLAINT:  Uncontrolled pain, left knee.  HISTORY OF PRESENT ILLNESS:  The patient is a 61 year old gentleman well- known to Dr. Thomasena Edis for evaluation and treatment of bilateral knee pains.  The patient has had a left total knee arthroplasty done in 2005 with excellent results.  He has done well with that knee.  He has had progressive worsening of pain in his left knee.  He denies any traumatic event.  It is now affecting his activities of daily living.  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  He does also have significant night pain.  He has failed conservative treatment, which includes injections and physical therapy.  The injections include Visco supplement and cortisone injections.  The patient has discussed this with Dr. Thomasena Edis.  The patient and Dr. Thomasena Edis agreed to proceed with total knee arthroplasty.  He was given the pros and cons, possible complications, recovery course.  The patient elected to proceed.  He has also got medical clearance from his primary care physician.  ALLERGIES:  No known drug allergies.  CURRENT MEDICATIONS: 1. Amlodipine 10 mg a day. 2. Diovan 320 mg once a day. 3. Triamterene/hydrochlorothiazide 37.5/25 mg tablet, one-half tablet     a day. 4. Simvastatin 40 mg once a day. 5. Colchicine p.r.n.  CURRENT MEDICAL DIAGNOSES:  Hypertension and hypercholesterolemia and sleep apnea without CPAP use.  PRIMARY CARE PHYSICIAN:  Willow Ora, MD  REVIEW OF SYSTEMS:  NEUROLOGIC and  ENT:  Negative for strokes, seizures, convulsions, or drug problems.  Dentures.  PULMONARY:  He denies any shortness of breath, wheezing, productive cough.  He denies any history of asthma, frequent pneumonia, COPD, or tuberculosis.  He does have some borderline sleep apnea.  He does not use a CPAP. CARDIOVASCULAR:  He has been on the same blood pressure and cholesterol medicine for a while.  He has never had to have a stress test.  He denies any chest pain, irregular heart rhythms, skipping beats, shortness of breath with activities, or at night.  GI:  He denies any problems with hernia, indigestion, constipation, diarrhea, bloating, jaundice, colon issues, diverticulosis, and irritable bowel, gallbladder.  GU:  He denies any problems with his prostate.  No history of kidney stones.  No problems urinating, holding his urine or initiating urine.  ENDOCRINE:  He denies any thyroid or diabetic issues. HEMATOLOGIC:  He denies any blood transfusions in the past, blood clots, or any unusual infections.  He does have a history of gout in the past, last was in his wrist.  He does have colchicine at home.  PAST SURGICAL HISTORY:  Right total knee arthroplasty in 2005 without any complications with  anesthesia.  FAMILY MEDICAL HISTORY:  Father is deceased from liver disease.  Mother is alive at 82 years of age with open heart surgery.  SOCIAL HISTORY:  He is married.  He works in Airline pilot.  He stopped smoking 15 years previous.  He does have about 2-3 drinks a day.  He does have a family support who will take care from afterwards at home.  PHYSICAL EXAMINATION:  VITAL SIGNS:  Height is 5 feet and 9 inches, weight is 240 pounds, blood pressure is 138/80, pulse of 80 and regular, respirations 12 nonlabored. GENERAL:  This is a healthy-appearing well-developed gentleman, is slightly centrally obese, ambulates with a slight left-sided limp. HEAD:  Normocephalic. NECK:  He has got good motion of his  neck.  He has no palpable lymphadenopathy. LUNGS:  Clear throughout without any wheezing or rhonchi. HEART:  Regular rate and rhythm.  No murmurs, rubs, or gallops. ABDOMEN:  Round, full.  Bowel sounds present.  Soft, nontender. EXTREMITIES:  Upper extremities have good range of motion with good upper motor strength.  Lower extremities, right knee with full extension.  Flexion back to 120 with anterior well-healed midline surgical incision.  His calf is soft, nontender.  He has got good motion of the ankle.  Good motion of the hip.  His left knee, he lacks about 10 degrees extension.  He is able to flex it back to about 110 degrees with discomfort and crepitus.  His calf is soft, nontender.  Good motion of the hip and ankle today.  Good pulses and good sensation lower extremities.  IMPRESSION: 1. 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     Failed conservative treatment. 2. Hypertension. 3. Hypercholesterolemia. 4. Sleep apnea without CPAP use. 5. History of gout.  PLAN:  The patient will undergo all routine labs and tests prior to having a left total knee arthroplasty by Dr. Thomasena Edis at [facility] on December 04, 2011.     Jamelle Rushing, P.A.   ______________________________ Erasmo Leventhal, M.D.    RWK/MEDQ  D:  11/30/2011  T:  12/01/2011  Job:  [id number]

## 2011-12-02 ENCOUNTER — Other Ambulatory Visit: Payer: Self-pay | Admitting: Internal Medicine

## 2011-12-02 NOTE — Telephone Encounter (Signed)
Refill done.  

## 2011-12-02 NOTE — Progress Notes (Signed)
CXR and EKG 11-19-2011 Dr. Drue Novel to sign off on the EKG to make it confirmed and office will fax it over

## 2011-12-03 MED ORDER — CEFAZOLIN SODIUM-DEXTROSE 2-3 GM-% IV SOLR
2.0000 g | INTRAVENOUS | Status: AC
  Administered 2011-12-04: 2 g via INTRAVENOUS

## 2011-12-03 MED ORDER — SODIUM CHLORIDE 0.9 % IV SOLN: INTRAVENOUS | Status: DC

## 2011-12-04 ENCOUNTER — Encounter (HOSPITAL_COMMUNITY)
Admission: RE | Disposition: A | Discharge status: Home Health | Payer: Self-pay | Source: Ambulatory Visit | Attending: Specialist

## 2011-12-04 ENCOUNTER — Encounter (HOSPITAL_COMMUNITY): Payer: Self-pay | Admitting: Anesthesiology

## 2011-12-04 ENCOUNTER — Encounter (HOSPITAL_COMMUNITY): Payer: Self-pay | Admitting: *Deleted

## 2011-12-04 ENCOUNTER — Inpatient Hospital Stay (HOSPITAL_COMMUNITY): Payer: 59 | Admitting: Anesthesiology

## 2011-12-04 ENCOUNTER — Inpatient Hospital Stay (HOSPITAL_COMMUNITY)
Admission: RE | Admit: 2011-12-04 | Discharge: 2011-12-07 | DRG: 470 | Disposition: A | Discharge status: Home Health | Payer: 59 | Source: Ambulatory Visit | Attending: Specialist | Admitting: Specialist

## 2011-12-04 DIAGNOSIS — E785 Hyperlipidemia, unspecified: Secondary | ICD-10-CM

## 2011-12-04 DIAGNOSIS — Z87891 Personal history of nicotine dependence: Secondary | ICD-10-CM

## 2011-12-04 DIAGNOSIS — Z79899 Other long term (current) drug therapy: Secondary | ICD-10-CM

## 2011-12-04 DIAGNOSIS — M109 Gout, unspecified: Secondary | ICD-10-CM | POA: Diagnosis present

## 2011-12-04 DIAGNOSIS — E291 Testicular hypofunction: Secondary | ICD-10-CM

## 2011-12-04 DIAGNOSIS — G4733 Obstructive sleep apnea (adult) (pediatric): Secondary | ICD-10-CM | POA: Diagnosis present

## 2011-12-04 DIAGNOSIS — Z96659 Presence of unspecified artificial knee joint: Secondary | ICD-10-CM

## 2011-12-04 DIAGNOSIS — M1710 Unilateral primary osteoarthritis, unspecified knee: Principal | ICD-10-CM | POA: Diagnosis present

## 2011-12-04 DIAGNOSIS — I10 Essential (primary) hypertension: Secondary | ICD-10-CM

## 2011-12-04 DIAGNOSIS — R739 Hyperglycemia, unspecified: Secondary | ICD-10-CM

## 2011-12-04 DIAGNOSIS — N529 Male erectile dysfunction, unspecified: Secondary | ICD-10-CM

## 2011-12-04 DIAGNOSIS — Z01818 Encounter for other preprocedural examination: Secondary | ICD-10-CM

## 2011-12-04 HISTORY — PX: TOTAL KNEE ARTHROPLASTY: SHX125

## 2011-12-04 LAB — TYPE AND SCREEN
ABO/RH(D): A POS
Antibody Screen: NEGATIVE

## 2011-12-04 SURGERY — ARTHROPLASTY, KNEE, TOTAL
Anesthesia: Spinal | Site: Knee | Laterality: Left | Wound class: Clean

## 2011-12-04 MED ORDER — OXYCODONE HCL 5 MG PO TABS
5.0000 mg | ORAL_TABLET | ORAL | Status: DC | PRN
  Administered 2011-12-04: 5 mg via ORAL
  Administered 2011-12-05 – 2011-12-07 (×11): 10 mg via ORAL
  Filled 2011-12-04 (×2): qty 2
  Filled 2011-12-04: qty 1
  Filled 2011-12-04 (×9): qty 2

## 2011-12-04 MED ORDER — ACETAMINOPHEN 325 MG PO TABS: 650.0000 mg | ORAL_TABLET | Freq: Four times a day (QID) | ORAL | Status: DC | PRN

## 2011-12-04 MED ORDER — FENTANYL CITRATE 0.05 MG/ML IJ SOLN
INTRAMUSCULAR | Status: DC | PRN
  Administered 2011-12-04 (×3): 50 ug via INTRAVENOUS

## 2011-12-04 MED ORDER — LACTATED RINGERS IV SOLN: INTRAVENOUS | Status: DC

## 2011-12-04 MED ORDER — PROPOFOL 10 MG/ML IV EMUL
INTRAVENOUS | Status: DC | PRN
  Administered 2011-12-04: 100 ug/kg/min via INTRAVENOUS
  Administered 2011-12-04 (×2): 75 ug/kg/min via INTRAVENOUS

## 2011-12-04 MED ORDER — POVIDONE-IODINE 7.5 % EX SOLN: Freq: Once | CUTANEOUS | Status: DC

## 2011-12-04 MED ORDER — FERROUS SULFATE 325 (65 FE) MG PO TABS
325.0000 mg | ORAL_TABLET | Freq: Three times a day (TID) | ORAL | Status: DC
  Administered 2011-12-05 – 2011-12-07 (×7): 325 mg via ORAL
  Filled 2011-12-04 (×11): qty 1

## 2011-12-04 MED ORDER — HYDROMORPHONE HCL PF 1 MG/ML IJ SOLN: 0.2500 mg | INTRAMUSCULAR | Status: DC | PRN

## 2011-12-04 MED ORDER — DOCUSATE SODIUM 100 MG PO CAPS
100.0000 mg | ORAL_CAPSULE | Freq: Two times a day (BID) | ORAL | Status: DC
  Administered 2011-12-04 – 2011-12-07 (×6): 100 mg via ORAL

## 2011-12-04 MED ORDER — METHOCARBAMOL 500 MG PO TABS
500.0000 mg | ORAL_TABLET | Freq: Four times a day (QID) | ORAL | Status: DC | PRN
  Administered 2011-12-04 – 2011-12-07 (×7): 500 mg via ORAL
  Filled 2011-12-04 (×7): qty 1

## 2011-12-04 MED ORDER — MEPERIDINE HCL 50 MG/ML IJ SOLN: 6.2500 mg | INTRAMUSCULAR | Status: DC | PRN

## 2011-12-04 MED ORDER — MENTHOL 3 MG MT LOZG
1.0000 | LOZENGE | OROMUCOSAL | Status: DC | PRN
  Filled 2011-12-04: qty 9

## 2011-12-04 MED ORDER — ACETAMINOPHEN 650 MG RE SUPP: 650.0000 mg | Freq: Four times a day (QID) | RECTAL | Status: DC | PRN

## 2011-12-04 MED ORDER — IRBESARTAN 300 MG PO TABS
300.0000 mg | ORAL_TABLET | Freq: Every day | ORAL | Status: DC
  Administered 2011-12-04 – 2011-12-07 (×4): 300 mg via ORAL
  Filled 2011-12-04 (×4): qty 1

## 2011-12-04 MED ORDER — STERILE WATER FOR IRRIGATION IR SOLN
Status: DC | PRN
  Administered 2011-12-04: 1500 mL

## 2011-12-04 MED ORDER — PROMETHAZINE HCL 25 MG/ML IJ SOLN: 6.2500 mg | INTRAMUSCULAR | Status: DC | PRN

## 2011-12-04 MED ORDER — ONDANSETRON HCL 4 MG/2ML IJ SOLN: 4.0000 mg | Freq: Four times a day (QID) | INTRAMUSCULAR | Status: DC | PRN

## 2011-12-04 MED ORDER — MIDAZOLAM HCL 5 MG/5ML IJ SOLN
INTRAMUSCULAR | Status: DC | PRN
  Administered 2011-12-04 (×4): 1 mg via INTRAVENOUS

## 2011-12-04 MED ORDER — ZOLPIDEM TARTRATE 5 MG PO TABS: 5.0000 mg | ORAL_TABLET | Freq: Every evening | ORAL | Status: DC | PRN

## 2011-12-04 MED ORDER — PHENOL 1.4 % MT LIQD
1.0000 | OROMUCOSAL | Status: DC | PRN
  Filled 2011-12-04: qty 177

## 2011-12-04 MED ORDER — HYDROMORPHONE HCL PF 1 MG/ML IJ SOLN
0.5000 mg | INTRAMUSCULAR | Status: DC | PRN
  Administered 2011-12-04 – 2011-12-05 (×3): 1 mg via INTRAVENOUS
  Filled 2011-12-04 (×3): qty 1

## 2011-12-04 MED ORDER — LACTATED RINGERS IV SOLN
INTRAVENOUS | Status: DC
  Administered 2011-12-04 (×3): via INTRAVENOUS

## 2011-12-04 MED ORDER — FLEET ENEMA 7-19 GM/118ML RE ENEM: 1.0000 | ENEMA | Freq: Once | RECTAL | Status: AC | PRN

## 2011-12-04 MED ORDER — POTASSIUM CHLORIDE IN NACL 20-0.9 MEQ/L-% IV SOLN
INTRAVENOUS | Status: DC
  Administered 2011-12-04: 21:00:00 via INTRAVENOUS
  Administered 2011-12-05: 1000 mL via INTRAVENOUS
  Filled 2011-12-04 (×4): qty 1000

## 2011-12-04 MED ORDER — TRIAMTERENE-HCTZ 37.5-25 MG PO TABS
1.0000 | ORAL_TABLET | Freq: Every day | ORAL | Status: DC
  Administered 2011-12-04 – 2011-12-07 (×4): 1 via ORAL
  Filled 2011-12-04 (×4): qty 1

## 2011-12-04 MED ORDER — BUPIVACAINE-EPINEPHRINE PF 0.25-1:200000 % IJ SOLN
INTRAMUSCULAR | Status: DC | PRN
  Administered 2011-12-04: 60 mL

## 2011-12-04 MED ORDER — ONDANSETRON HCL 4 MG/2ML IJ SOLN
INTRAMUSCULAR | Status: DC | PRN
  Administered 2011-12-04: 4 mg via INTRAVENOUS

## 2011-12-04 MED ORDER — BUPIVACAINE IN DEXTROSE 0.75-8.25 % IT SOLN
INTRATHECAL | Status: DC | PRN
  Administered 2011-12-04: 2 mL via INTRATHECAL

## 2011-12-04 MED ORDER — BUPIVACAINE-EPINEPHRINE 0.25% -1:200000 IJ SOLN
INTRAMUSCULAR | Status: AC
  Filled 2011-12-04: qty 1

## 2011-12-04 MED ORDER — SODIUM CHLORIDE 0.9 % IR SOLN
Status: DC | PRN
  Administered 2011-12-04: 3000 mL

## 2011-12-04 MED ORDER — EPHEDRINE SULFATE 50 MG/ML IJ SOLN
INTRAMUSCULAR | Status: DC | PRN
  Administered 2011-12-04 (×2): 5 mg via INTRAVENOUS

## 2011-12-04 MED ORDER — ACETAMINOPHEN 10 MG/ML IV SOLN
INTRAVENOUS | Status: DC | PRN
  Administered 2011-12-04: 1000 mg via INTRAVENOUS

## 2011-12-04 MED ORDER — DIPHENHYDRAMINE HCL 12.5 MG/5ML PO ELIX: 12.5000 mg | ORAL_SOLUTION | ORAL | Status: DC | PRN

## 2011-12-04 MED ORDER — ACETAMINOPHEN 10 MG/ML IV SOLN
1000.0000 mg | Freq: Four times a day (QID) | INTRAVENOUS | Status: AC
  Administered 2011-12-05 (×4): 1000 mg via INTRAVENOUS
  Filled 2011-12-04 (×5): qty 100

## 2011-12-04 MED ORDER — SIMVASTATIN 40 MG PO TABS
40.0000 mg | ORAL_TABLET | Freq: Every day | ORAL | Status: DC
  Administered 2011-12-04: 40 mg via ORAL
  Filled 2011-12-04 (×2): qty 1

## 2011-12-04 MED ORDER — METHOCARBAMOL 100 MG/ML IJ SOLN
500.0000 mg | Freq: Four times a day (QID) | INTRAVENOUS | Status: DC | PRN
  Filled 2011-12-04: qty 5

## 2011-12-04 MED ORDER — BISACODYL 10 MG RE SUPP: 10.0000 mg | Freq: Every day | RECTAL | Status: DC | PRN

## 2011-12-04 MED ORDER — KETOROLAC TROMETHAMINE 30 MG/ML IJ SOLN
INTRAMUSCULAR | Status: AC
  Filled 2011-12-04: qty 1

## 2011-12-04 MED ORDER — ENOXAPARIN SODIUM 30 MG/0.3ML ~~LOC~~ SOLN
30.0000 mg | Freq: Two times a day (BID) | SUBCUTANEOUS | Status: DC
  Administered 2011-12-05 – 2011-12-07 (×5): 30 mg via SUBCUTANEOUS
  Filled 2011-12-04 (×7): qty 0.3

## 2011-12-04 MED ORDER — KETOROLAC TROMETHAMINE 30 MG/ML IM SOLN
INTRAMUSCULAR | Status: DC | PRN
  Administered 2011-12-04: 30 mg

## 2011-12-04 MED ORDER — BUPIVACAINE-EPINEPHRINE PF 0.25-1:200000 % IJ SOLN
INTRAMUSCULAR | Status: AC
  Filled 2011-12-04: qty 60

## 2011-12-04 MED ORDER — ALUM & MAG HYDROXIDE-SIMETH 200-200-20 MG/5ML PO SUSP: 30.0000 mL | ORAL | Status: DC | PRN

## 2011-12-04 MED ORDER — SODIUM CHLORIDE 0.9 % IR SOLN
Status: DC | PRN
  Administered 2011-12-04: 1000 mL

## 2011-12-04 MED ORDER — AMLODIPINE BESYLATE 10 MG PO TABS
10.0000 mg | ORAL_TABLET | Freq: Every day | ORAL | Status: DC
  Administered 2011-12-05 – 2011-12-07 (×3): 10 mg via ORAL
  Filled 2011-12-04 (×3): qty 1

## 2011-12-04 MED ORDER — CEFAZOLIN SODIUM-DEXTROSE 2-3 GM-% IV SOLR
2.0000 g | Freq: Four times a day (QID) | INTRAVENOUS | Status: AC
  Administered 2011-12-04 – 2011-12-05 (×2): 2 g via INTRAVENOUS
  Filled 2011-12-04 (×2): qty 50

## 2011-12-04 MED ORDER — POLYETHYLENE GLYCOL 3350 17 G PO PACK: 17.0000 g | PACK | Freq: Every day | ORAL | Status: DC | PRN

## 2011-12-04 MED ORDER — METOCLOPRAMIDE HCL 10 MG PO TABS: 5.0000 mg | ORAL_TABLET | Freq: Three times a day (TID) | ORAL | Status: DC | PRN

## 2011-12-04 MED ORDER — METOCLOPRAMIDE HCL 5 MG/ML IJ SOLN: 5.0000 mg | Freq: Three times a day (TID) | INTRAMUSCULAR | Status: DC | PRN

## 2011-12-04 MED ORDER — ONDANSETRON HCL 4 MG PO TABS: 4.0000 mg | ORAL_TABLET | Freq: Four times a day (QID) | ORAL | Status: DC | PRN

## 2011-12-04 SURGICAL SUPPLY — 63 items
BAG SPEC THK2 15X12 ZIP CLS (MISCELLANEOUS) ×2
BAG ZIPLOCK 12X15 (MISCELLANEOUS) ×4 IMPLANT
BANDAGE ELASTIC 4 VELCRO ST LF (GAUZE/BANDAGES/DRESSINGS) ×2 IMPLANT
BANDAGE ELASTIC 6 VELCRO ST LF (GAUZE/BANDAGES/DRESSINGS) ×2 IMPLANT
BANDAGE ESMARK 6X9 LF (GAUZE/BANDAGES/DRESSINGS) ×1 IMPLANT
BANDAGE GAUZE ELAST BULKY 4 IN (GAUZE/BANDAGES/DRESSINGS) ×4 IMPLANT
BLADE SAG 18X100X1.27 (BLADE) ×2 IMPLANT
BLADE SAW SGTL 13.0X1.19X90.0M (BLADE) ×2 IMPLANT
BNDG CMPR 9X6 STRL LF SNTH (GAUZE/BANDAGES/DRESSINGS) ×1
BNDG ESMARK 6X9 LF (GAUZE/BANDAGES/DRESSINGS) ×2
CEMENT HV SMART SET (Cement) ×4 IMPLANT
CLOTH BEACON ORANGE TIMEOUT ST (SAFETY) ×2 IMPLANT
CUFF TOURN SGL QUICK 34 (TOURNIQUET CUFF) ×2
CUFF TRNQT CYL 34X4X40X1 (TOURNIQUET CUFF) ×1 IMPLANT
DRAPE EXTREMITY T 121X128X90 (DRAPE) ×2 IMPLANT
DRAPE LG THREE QUARTER DISP (DRAPES) ×2 IMPLANT
DRAPE POUCH INSTRU U-SHP 10X18 (DRAPES) ×2 IMPLANT
DRAPE U-SHAPE 47X51 STRL (DRAPES) ×2 IMPLANT
DRSG PAD ABDOMINAL 8X10 ST (GAUZE/BANDAGES/DRESSINGS) ×4 IMPLANT
DURAPREP 26ML APPLICATOR (WOUND CARE) ×2 IMPLANT
ELECT REM PT RETURN 9FT ADLT (ELECTROSURGICAL) ×2
ELECTRODE REM PT RTRN 9FT ADLT (ELECTROSURGICAL) ×1 IMPLANT
EVACUATOR 1/8 PVC DRAIN (DRAIN) ×2 IMPLANT
FACESHIELD LNG OPTICON STERILE (SAFETY) ×10 IMPLANT
GAUZE XEROFORM 2X2 STRL (GAUZE/BANDAGES/DRESSINGS) ×2 IMPLANT
GLOVE ECLIPSE 8.0 STRL XLNG CF (GLOVE) ×2 IMPLANT
GLOVE SURG ORTHO 8.0 STRL STRW (GLOVE) ×4 IMPLANT
GLOVE SURG ORTHO 9.0 STRL STRW (GLOVE) ×2 IMPLANT
GOWN PREVENTION PLUS XLARGE (GOWN DISPOSABLE) ×6 IMPLANT
GOWN STRL NON-REIN LRG LVL3 (GOWN DISPOSABLE) ×2 IMPLANT
GOWN STRL REIN XL XLG (GOWN DISPOSABLE) ×5 IMPLANT
HANDPIECE INTERPULSE COAX TIP (DISPOSABLE) ×2
IMMOBILIZER KNEE 20 (SOFTGOODS) ×2
IMMOBILIZER KNEE 20 THIGH 36 (SOFTGOODS) IMPLANT
KIT BASIN OR (CUSTOM PROCEDURE TRAY) ×2 IMPLANT
NDL SAFETY ECLIPSE 18X1.5 (NEEDLE) ×1 IMPLANT
NEEDLE HYPO 18GX1.5 SHARP (NEEDLE) ×2
NS IRRIG 1000ML POUR BTL (IV SOLUTION) ×2 IMPLANT
PACK TOTAL JOINT (CUSTOM PROCEDURE TRAY) ×2 IMPLANT
POSITIONER SURGICAL ARM (MISCELLANEOUS) ×2 IMPLANT
SET HNDPC FAN SPRY TIP SCT (DISPOSABLE) ×1 IMPLANT
SET PAD KNEE POSITIONER (MISCELLANEOUS) ×2 IMPLANT
SPONGE GAUZE 4X4 12PLY (GAUZE/BANDAGES/DRESSINGS) ×2 IMPLANT
SPONGE LAP 18X18 X RAY DECT (DISPOSABLE) IMPLANT
SPONGE SURGIFOAM ABS GEL 100 (HEMOSTASIS) ×2 IMPLANT
STOCKINETTE 6  STRL (DRAPES) ×1
STOCKINETTE 6 STRL (DRAPES) ×1 IMPLANT
STRIP CLOSURE SKIN 1/2X4 (GAUZE/BANDAGES/DRESSINGS) ×4 IMPLANT
SUCTION FRAZIER 12FR DISP (SUCTIONS) ×2 IMPLANT
SUT BONE WAX W31G (SUTURE) ×2 IMPLANT
SUT MNCRL AB 3-0 PS2 18 (SUTURE) ×2 IMPLANT
SUT VIC AB 1 CT1 27 (SUTURE) ×14
SUT VIC AB 1 CT1 27XBRD ANTBC (SUTURE) ×7 IMPLANT
SUT VIC AB 2-0 CT1 27 (SUTURE) ×4
SUT VIC AB 2-0 CT1 TAPERPNT 27 (SUTURE) ×2 IMPLANT
SUT VLOC 180 0 24IN GS25 (SUTURE) ×2 IMPLANT
SYR 50ML LL SCALE MARK (SYRINGE) ×2 IMPLANT
TAPE STRIPS DRAPE STRL (GAUZE/BANDAGES/DRESSINGS) ×2 IMPLANT
TOWEL OR 17X26 10 PK STRL BLUE (TOWEL DISPOSABLE) ×6 IMPLANT
TOWER CARTRIDGE SMART MIX (DISPOSABLE) ×2 IMPLANT
TRAY FOLEY CATH 14FRSI W/METER (CATHETERS) ×2 IMPLANT
WATER STERILE IRR 1500ML POUR (IV SOLUTION) ×3 IMPLANT
WRAP KNEE MAXI GEL POST OP (GAUZE/BANDAGES/DRESSINGS) ×2 IMPLANT

## 2011-12-04 NOTE — Anesthesia Preprocedure Evaluation (Signed)
Anesthesia Evaluation  Patient identified by MRN, date of birth, ID band Patient awake    Reviewed: Allergy & Precautions, H&P , NPO status , Patient's Chart, lab work & pertinent test results  Airway Mallampati: II TM Distance: >3 FB Neck ROM: Full    Dental No notable dental hx.    Pulmonary neg pulmonary ROS, sleep apnea ,  breath sounds clear to auscultation  Pulmonary exam normal       Cardiovascular hypertension, Pt. on medications negative cardio ROS  Rhythm:Regular Rate:Normal     Neuro/Psych negative neurological ROS  negative psych ROS   GI/Hepatic negative GI ROS, Neg liver ROS,   Endo/Other  negative endocrine ROS  Renal/GU negative Renal ROS  negative genitourinary   Musculoskeletal negative musculoskeletal ROS (+)   Abdominal   Peds negative pediatric ROS (+)  Hematology negative hematology ROS (+)   Anesthesia Other Findings   Reproductive/Obstetrics negative OB ROS                           Anesthesia Physical Anesthesia Plan  ASA: II  Anesthesia Plan: Spinal   Post-op Pain Management:    Induction:   Airway Management Planned: Simple Face Mask  Additional Equipment:   Intra-op Plan:   Post-operative Plan:   Informed Consent: I have reviewed the patients History and Physical, chart, labs and discussed the procedure including the risks, benefits and alternatives for the proposed anesthesia with the patient or authorized representative who has indicated his/her understanding and acceptance.   Dental advisory given  Plan Discussed with: CRNA  Anesthesia Plan Comments:         Anesthesia Quick Evaluation

## 2011-12-04 NOTE — H&P (Signed)
Jimmy Valentine, Jimmy Valentine NO.:  [id number]  MEDICAL RECORD NO.:  [id number]  LOCATION:                                FACILITY:  [facility]  PHYSICIAN:  Erasmo Leventhal, M.D.DATE OF BIRTH:  26-Apr-1950  DATE OF ADMISSION:  12/04/2011 DATE OF DISCHARGE:                             HISTORY & PHYSICAL   CHIEF COMPLAINT:  Uncontrolled pain, left knee.  HISTORY OF PRESENT ILLNESS:  The patient is a 61 year old gentleman well- known to Dr. Thomasena Edis for evaluation and treatment of bilateral knee pains.  The patient has had a left total knee arthroplasty done in 2005 with excellent results.  He has done well with that knee.  He has had progressive worsening of pain in his left knee.  He denies any traumatic event.  It is now affecting his activities of daily living.  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  He does also have significant night pain.  He has failed conservative treatment, which includes injections and physical therapy.  The injections include Visco supplement and cortisone injections.  The patient has discussed this with Dr. Thomasena Edis.  The patient and Dr. Thomasena Edis agreed to proceed with total knee arthroplasty.  He was given the pros and cons, possible complications, recovery course.  The patient elected to proceed.  He has also got medical clearance from his primary care physician.  ALLERGIES:  No known drug allergies.  CURRENT MEDICATIONS: 1. Amlodipine 10 mg a day. 2. Diovan 320 mg once a day. 3. Triamterene/hydrochlorothiazide 37.5/25 mg tablet, one-half tablet     a day. 4. Simvastatin 40 mg once a day. 5. Colchicine p.r.n.  CURRENT MEDICAL DIAGNOSES:  Hypertension and hypercholesterolemia and sleep apnea without CPAP use.  PRIMARY CARE PHYSICIAN:  Willow Ora, MD  REVIEW OF SYSTEMS:  NEUROLOGIC and  ENT:  Negative for strokes, seizures, convulsions, or drug problems.  Dentures.  PULMONARY:  He denies any shortness of breath, wheezing, productive cough.  He denies any history of asthma, frequent pneumonia, COPD, or tuberculosis.  He does have some borderline sleep apnea.  He does not use a CPAP. CARDIOVASCULAR:  He has been on the same blood pressure and cholesterol medicine for a while.  He has never had to have a stress test.  He denies any chest pain, irregular heart rhythms, skipping beats, shortness of breath with activities, or at night.  GI:  He denies any problems with hernia, indigestion, constipation, diarrhea, bloating, jaundice, colon issues, diverticulosis, and irritable bowel, gallbladder.  GU:  He denies any problems with his prostate.  No history of kidney stones.  No problems urinating, holding his urine or initiating urine.  ENDOCRINE:  He denies any thyroid or diabetic issues. HEMATOLOGIC:  He denies any blood transfusions in the past, blood clots, or any unusual infections.  He does have a history of gout in the past, last was in his wrist.  He does have colchicine at home.  PAST SURGICAL HISTORY:  Right total knee arthroplasty in 2005 without any complications with  anesthesia.  FAMILY MEDICAL HISTORY:  Father is deceased from liver disease.  Mother is alive at 41 years of age with open heart surgery.  SOCIAL HISTORY:  He is married.  He works in [employer].  He stopped smoking 15 years previous.  He does have about 2-3 drinks a day.  He does have a family support who will take care from afterwards at home.  PHYSICAL EXAMINATION:  VITAL SIGNS:  Height is 5 feet and 9 inches, weight is 240 pounds, blood pressure is 138/80, pulse of 80 and regular, respirations 12 nonlabored. GENERAL:  This is a healthy-appearing well-developed gentleman, is slightly centrally obese, ambulates with a slight left-sided limp. HEAD:  Normocephalic. NECK:  He has got good motion of his  neck.  He has no palpable lymphadenopathy. LUNGS:  Clear throughout without any wheezing or rhonchi. HEART:  Regular rate and rhythm.  No murmurs, rubs, or gallops. ABDOMEN:  Round, full.  Bowel sounds present.  Soft, nontender. EXTREMITIES:  Upper extremities have good range of motion with good upper motor strength.  Lower extremities, right knee with full extension.  Flexion back to 120 with anterior well-healed midline surgical incision.  His calf is soft, nontender.  He has got good motion of the ankle.  Good motion of the hip.  His left knee, he lacks about 10 degrees extension.  He is able to flex it back to about 110 degrees with discomfort and crepitus.  His calf is soft, nontender.  Good motion of the hip and ankle today.  Good pulses and good sensation lower extremities.  IMPRESSION: 1. 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     Failed conservative treatment. 2. Hypertension. 3. Hypercholesterolemia. 4. Sleep apnea without CPAP use. 5. History of gout.  PLAN:  The patient will undergo all routine labs and tests prior to having a left total knee arthroplasty by Dr. Thomasena Edis at [facility] on December 04, 2011.     Jamelle Rushing, P.A.   ______________________________ Erasmo Leventhal, M.D.    RWK/MEDQ  D:  11/30/2011  T:  12/01/2011  Job:  [id number] I have seen and examined this patient.  Agree with the note above.  Sammye Staff ANDREW 12/04/2011 1:04 PM

## 2011-12-04 NOTE — Anesthesia Postprocedure Evaluation (Signed)
  Anesthesia Post-op Note  Patient: Jimmy Valentine  Procedure(s) Performed: Procedure(s) (LRB): TOTAL KNEE ARTHROPLASTY (Left)  Patient Location: PACU  Anesthesia Type: Spinal  Level of Consciousness: awake and alert   Airway and Oxygen Therapy: Patient Spontanous Breathing  Post-op Pain: mild  Post-op Assessment: Post-op Vital signs reviewed, Patient's Cardiovascular Status Stable, Respiratory Function Stable, Patent Airway and No signs of Nausea or vomiting  Post-op Vital Signs: stable  Complications: No apparent anesthesia complications

## 2011-12-04 NOTE — Anesthesia Procedure Notes (Addendum)
Spinal  Start time: 12/04/2011 5:38 PM  Spinal  Start time: 12/04/2011 5:06 PM Staffing CRNA/Resident: Phillips Grout E Performed by: resident/CRNA  Preanesthetic Checklist Completed: patient identified, site marked, surgical consent, pre-op evaluation, timeout performed, IV checked, risks and benefits discussed and monitors and equipment checked Spinal Block Patient position: sitting Prep: Betadine Patient monitoring: cardiac monitor, continuous pulse ox and blood pressure Approach: right paramedian Location: L3-4 Injection technique: single-shot Needle Needle type: Spinocan  Needle gauge: 22 G Needle length: 9 cm Additional Notes tolerated well,  neg. Paresthesia good clear csf flow

## 2011-12-04 NOTE — H&P (Signed)
NAMEELIESER, GARVIS NO.:  [id number]  MEDICAL RECORD NO.:  [id number]  LOCATION:                                FACILITY:  [facility]  PHYSICIAN:  Erasmo Leventhal, M.D.DATE OF BIRTH:  26-Apr-1950  DATE OF ADMISSION:  12/04/2011 DATE OF DISCHARGE:                             HISTORY & PHYSICAL   CHIEF COMPLAINT:  Uncontrolled pain, left knee.  HISTORY OF PRESENT ILLNESS:  The patient is a 61 year old gentleman well- known to Dr. Thomasena Edis for evaluation and treatment of bilateral knee pains.  The patient has had a left total knee arthroplasty done in 2005 with excellent results.  He has done well with that knee.  He has had progressive worsening of pain in his left knee.  He denies any traumatic event.  It is now affecting his activities of daily living.  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  He does also have significant night pain.  He has failed conservative treatment, which includes injections and physical therapy.  The injections include Visco supplement and cortisone injections.  The patient has discussed this with Dr. Thomasena Edis.  The patient and Dr. Thomasena Edis agreed to proceed with total knee arthroplasty.  He was given the pros and cons, possible complications, recovery course.  The patient elected to proceed.  He has also got medical clearance from his primary care physician.  ALLERGIES:  No known drug allergies.  CURRENT MEDICATIONS: 1. Amlodipine 10 mg a day. 2. Diovan 320 mg once a day. 3. Triamterene/hydrochlorothiazide 37.5/25 mg tablet, one-half tablet     a day. 4. Simvastatin 40 mg once a day. 5. Colchicine p.r.n.  CURRENT MEDICAL DIAGNOSES:  Hypertension and hypercholesterolemia and sleep apnea without CPAP use.  PRIMARY CARE PHYSICIAN:  Willow Ora, MD  REVIEW OF SYSTEMS:  NEUROLOGIC and  ENT:  Negative for strokes, seizures, convulsions, or drug problems.  Dentures.  PULMONARY:  He denies any shortness of breath, wheezing, productive cough.  He denies any history of asthma, frequent pneumonia, COPD, or tuberculosis.  He does have some borderline sleep apnea.  He does not use a CPAP. CARDIOVASCULAR:  He has been on the same blood pressure and cholesterol medicine for a while.  He has never had to have a stress test.  He denies any chest pain, irregular heart rhythms, skipping beats, shortness of breath with activities, or at night.  GI:  He denies any problems with hernia, indigestion, constipation, diarrhea, bloating, jaundice, colon issues, diverticulosis, and irritable bowel, gallbladder.  GU:  He denies any problems with his prostate.  No history of kidney stones.  No problems urinating, holding his urine or initiating urine.  ENDOCRINE:  He denies any thyroid or diabetic issues. HEMATOLOGIC:  He denies any blood transfusions in the past, blood clots, or any unusual infections.  He does have a history of gout in the past, last was in his wrist.  He does have colchicine at home.  PAST SURGICAL HISTORY:  Right total knee arthroplasty in 2005 without any complications with  anesthesia.  FAMILY MEDICAL HISTORY:  Father is deceased from liver disease.  Mother is alive at 24 years of age with open heart surgery.  SOCIAL HISTORY:  He is married.  He works in Air[employer].  He stopped smoking 15 years previous.  He does have about 2-3 drinks a day.  He does have a family support who will take care from afterwards at home.  PHYSICAL EXAMINATION:  VITAL SIGNS:  Height is 5 feet and 9 inches, weight is 240 pounds, blood pressure is 138/80, pulse of 80 and regular, respirations 12 nonlabored. GENERAL:  This is a healthy-appearing well-developed gentleman, is slightly centrally obese, ambulates with a slight left-sided limp. HEAD:  Normocephalic. NECK:  He has got good motion of his  neck.  He has no palpable lymphadenopathy. LUNGS:  Clear throughout without any wheezing or rhonchi. HEART:  Regular rate and rhythm.  No murmurs, rubs, or gallops. ABDOMEN:  Round, full.  Bowel sounds present.  Soft, nontender. EXTREMITIES:  Upper extremities have good range of motion with good upper motor strength.  Lower extremities, right knee with full extension.  Flexion back to 120 with anterior well-healed midline surgical incision.  His calf is soft, nontender.  He has got good motion of the ankle.  Good motion of the hip.  His left knee, he lacks about 10 degrees extension.  He is able to flex it back to about 110 degrees with discomfort and crepitus.  His calf is soft, nontender.  Good motion of the hip and ankle today.  Good pulses and good sensation lower extremities.  IMPRESSION: 1. 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     Failed conservative treatment. 2. Hypertension. 3. Hypercholesterolemia. 4. Sleep apnea without CPAP use. 5. History of gout.  PLAN:  The patient will undergo all routine labs and tests prior to having a left total knee arthroplasty by Dr. Thomasena Edis at [facility] on December 04, 2011.     Jamelle Rushing, P.A.   ______________________________ Erasmo Leventhal, M.D.    RWK/MEDQ  D:  11/30/2011  T:  12/01/2011  Job:  [id number] I have seen and examined this patient.  Agree with the note above.  Brisa Auth ANDREW 12/04/2011 3:57 PM  I have seen and examined this patient.  Agree with the note above.  Zaelyn Barbary ANDREW 12/04/2011 3:57 PM

## 2011-12-04 NOTE — Transfer of Care (Signed)
Immediate Anesthesia Transfer of Care Note  Patient: Jimmy Valentine  Procedure(s) Performed: Procedure(s) (LRB) with comments: TOTAL KNEE ARTHROPLASTY (Left)  Patient Location: PACU  Anesthesia Type:Spinal  Level of Consciousness: awake, alert , oriented and patient cooperative  Airway & Oxygen Therapy: Patient Spontanous Breathing and Patient connected to face mask oxygen  Post-op Assessment: Report given to PACU RN, Post -op Vital signs reviewed and stable and SAB level T12.  Post vital signs: Reviewed and stable  Complications: No apparent anesthesia complications

## 2011-12-04 NOTE — Op Note (Signed)
DATE OF SURGERY:  12/04/2011  TIME: 7:06 PM  PATIENT NAME:  Jimmy Valentine    AGE: 61 y.o.   PRE-OPERATIVE DIAGNOSIS:  LEFT KNEE Osetoarthritis  POST-OPERATIVE DIAGNOSIS:  Left Knee Osteoarthritis  PROCEDURE:  Procedure(s): TOTAL KNEE ARTHROPLASTY  SURGEON:  Kem Parcher ANDREW  ASSISTANT:  Oneida Alar, PA-C, present and scrubbed throughout the case, critical for assistance with exposure, retraction, instrumentation, and closure.  OPERATIVE IMPLANTS: Depuy PFC Sigma Rotating Platform.  Femur size 4, Tibia size 4, Patella size 41 3-peg oval button, with a 10 mm polyethylene insert.   PREOPERATIVE INDICATIONS:   [name] is a 61 y.o. year old male with end stage bone on bone arthritis of the knee who failed conservative treatment and elected for Total Knee Arthroplasty.   The risks, benefits, and alternatives were discussed at length including but not limited to the risks of infection, bleeding, nerve injury, stiffness, blood clots, the need for revision surgery, cardiopulmonary complications, among others, and they were willing to proceed.  OPERATIVE DESCRIPTION:  The patient was brought to the operative room and placed in a supine position.  Spinal anesthesia was administered.  IV antibiotics were given.  The lower extremity was prepped and draped in the usual sterile fashion.  Time out was performed.  The leg was elevated and exsanguinated and the tourniquet was inflated.  Anterior quadriceps tendon splitting approach was performed.  The patella was retracted and osteophytes were removed.  The anterior horn of the medial and lateral meniscus was removed and cruciate ligaments resected.   The distal femur was opened with the drill and the intramedullary distal femoral cutting jig was utilized, set at 5 degrees resecting 10 mm off the distal femur.  Care was taken to protect the collateral ligaments.  The distal femoral sizing jig was applied, taking care to avoid notching.   Then the 4-in-1 cutting jig was applied and the anterior and posterior femur was cut, along with the chamfer cuts.    Then the extramedullary tibial cutting jig was utilized making the appropriate cut using the anterior tibial crest as a reference building in appropriate posterior slope.  Care was taken during the cut to protect the medial and collateral ligaments.  The proximal tibia was removed along with the posterior horns of the menisci.   The posterior medial femoral osteophytes and posterior lateral femoral osteophytes were removed.    The flexion gap was then measured and was symmetric with the extension gap, measured at 10.  I completed the distal femoral preparation using the appropriate jig to prepare the box.  The patella was then measured, and cut with the saw.    The proximal tibia sized and prepared accordingly with the reamer and the punch, and then all components were trialed with the trial insert.  The knee was found to have excellent balance and full motion.    The above named components were then cemented into place and all excess cement was removed.  The trial polyethylene component was in place during cementation, and then was exchanged for the real polyethylene component.    The knee was easily taken through a range of motion and the patella tracked well and the knee irrigated copiously and the parapatellar and subcutaneous tissue closed with vicryl, and monocryl with steri strips for the skin.  The arthrotomy was closed at 90 of flexion. The wounds were dressed with sterile gauze and the tourniquet released and the patient was awakened and returned to the PACU in stable and  satisfactory condition.  There were no complications.  Total tourniquet time was 90 minutes.injected periosteum with 60cc of 0.25 marcaine and epi and 30mg  toradol . V LOck used to close arthrotomy.

## 2011-12-05 LAB — CBC
Hemoglobin: 14.1 g/dL (ref 13.0–17.0)
MCH: 32.3 pg (ref 26.0–34.0)
Platelets: 215 10*3/uL (ref 150–400)
RBC: 4.37 MIL/uL (ref 4.22–5.81)

## 2011-12-05 LAB — BASIC METABOLIC PANEL
CO2: 25 mEq/L (ref 19–32)
Calcium: 9.2 mg/dL (ref 8.4–10.5)
GFR calc non Af Amer: 74 mL/min — ABNORMAL LOW (ref >90)
Glucose, Bld: 145 mg/dL — ABNORMAL HIGH (ref 70–99)
Potassium: 3.9 mEq/L (ref 3.5–5.1)
Sodium: 137 mEq/L (ref 135–145)

## 2011-12-05 MED ORDER — ATORVASTATIN CALCIUM 40 MG PO TABS
40.0000 mg | ORAL_TABLET | Freq: Every day | ORAL | Status: DC
  Filled 2011-12-05: qty 1

## 2011-12-05 MED ORDER — ATORVASTATIN CALCIUM 20 MG PO TABS
20.0000 mg | ORAL_TABLET | Freq: Every day | ORAL | Status: DC
  Administered 2011-12-05 – 2011-12-06 (×2): 20 mg via ORAL
  Filled 2011-12-05 (×3): qty 1

## 2011-12-05 NOTE — Evaluation (Signed)
Physical Therapy Evaluation Patient Details Name: Jimmy Valentine MRN: [id number] DOB: 02-24-1950 Today's Date: 12/05/2011 Time: 2130-8657 PT Time Calculation (min): 26 min  PT Assessment / Plan / Recommendation Clinical Impression  61 y.o. male admitted for L TKA, pt is POD #1. He ambulated 46' with RW with supervision, excellent progress expected. Plan is to DC home with HHPT. Pt would benefit from acute PT to maximize safety and independence with mobility.     PT Assessment  Patient needs continued PT services    Follow Up Recommendations  Home health PT    Does the patient have the potential to tolerate intense rehabilitation      Barriers to Discharge None      Equipment Recommendations  Rolling walker with 5" wheels;Tub/shower seat (pt stated he may be able to borrow equipment from a friend)    Recommendations for Other Services OT consult   Frequency 7X/week    Precautions / Restrictions Precautions Precautions: Knee Required Braces or Orthoses: Knee Immobilizer - Left Knee Immobilizer - Left: On except when in CPM Restrictions Weight Bearing Restrictions: No Other Position/Activity Restrictions: WBAT LLE   Pertinent Vitals/Pain *5/10 L knee Premedicated, ice applied after PT**      Mobility  Bed Mobility Bed Mobility: Supine to Sit Supine to Sit: 4: Min assist;HOB elevated Details for Bed Mobility Assistance: min A to support/advance LLE to EOB Transfers Transfers: Sit to Stand;Stand to Sit Sit to Stand: 5: Supervision;From bed Stand to Sit: 5: Supervision;With armrests;To chair/3-in-1 Details for Transfer Assistance: VCs hand placement Ambulation/Gait Ambulation/Gait Assistance: 4: Min guard Ambulation Distance (Feet): 22 Feet Assistive device: Rolling walker Gait Pattern: Step-to pattern General Gait Details: good sequencing with RW, no LOB, min/guard for safety 2* mild dizziness    Shoulder Instructions     Exercises Total Joint Exercises Ankle  Circles/Pumps: AROM;Both;10 reps;Supine Quad Sets: AROM;Both;10 reps;Supine Heel Slides: AAROM;Left;10 reps;Supine   PT Diagnosis: Difficulty walking;Acute pain  PT Problem List: Decreased strength;Decreased range of motion;Decreased activity tolerance;Decreased mobility;Pain PT Treatment Interventions: Gait training;DME instruction;Stair training;Therapeutic activities;Functional mobility training;Therapeutic exercise;Patient/family education   PT Goals Acute Rehab PT Goals PT Goal Formulation: With patient Time For Goal Achievement: 12/08/11 Potential to Achieve Goals: Good Pt will go Sit to Stand: with modified independence PT Goal: Sit to Stand - Progress: Goal set today Pt will Ambulate: >150 feet;with modified independence;with rolling walker PT Goal: Ambulate - Progress: Goal set today Pt will Go Up / Down Stairs: 3-5 stairs;with supervision;with rail(s);with crutches PT Goal: Up/Down Stairs - Progress: Goal set today Pt will Perform Home Exercise Program: Independently PT Goal: Perform Home Exercise Program - Progress: Goal set today  Visit Information  Last PT Received On: 12/05/11 Assistance Needed: +1    Subjective Data  Subjective: I couldn't have made it 3 more days on that knee, it was awful.  Patient Stated Goal: back to normal, fishing   Prior Functioning  Home Living Lives With: Spouse Available Help at Discharge: Family Type of Home: House Home Access: Stairs to enter Secretary/administrator of Steps: 3 Entrance Stairs-Rails: Right;Left Home Layout: One level Home Adaptive Equipment: Bedside commode/3-in-1 Additional Comments: pt stated he can borrow RW from friend Prior Function Level of Independence: Independent Able to Take Stairs?: Yes Driving: Yes Vocation: Full time employment Comments: traveling Adult nurse Communication: No difficulties    Cognition  Overall Cognitive Status: Appears within functional limits for tasks  assessed/performed Arousal/Alertness: Awake/alert Orientation Level: Appears intact for tasks assessed Behavior During Session: [facility] for  tasks performed    Extremity/Trunk Assessment Right Upper Extremity Assessment RUE ROM/Strength/Tone: [facility] for tasks assessed Left Upper Extremity Assessment LUE ROM/Strength/Tone: WFL for tasks assessed Right Lower Extremity Assessment RLE ROM/Strength/Tone: Within functional levels RLE Sensation: WFL - Light Touch RLE Coordination: WFL - gross/fine motor Left Lower Extremity Assessment LLE ROM/Strength/Tone: Deficits;Due to pain LLE ROM/Strength/Tone Deficits: L knee flexion approx 40*, limited by pain; ankle strength/ROM WNL; hip SLR -3/5 LLE Sensation: WFL - Light Touch LLE Coordination: WFL - gross/fine motor Trunk Assessment Trunk Assessment: Normal   Balance    End of Session PT - End of Session Equipment Utilized During Treatment: Gait belt;Left knee immobilizer Activity Tolerance: Patient tolerated treatment well Patient left: in chair;with call bell/phone within reach Nurse Communication: Mobility status  GP     Ralene Bathe Kistler 12/05/2011, 9:30 AM  [phone]

## 2011-12-05 NOTE — Progress Notes (Signed)
Physical Therapy Treatment Patient Details Name: Jimmy Valentine MRN: [id number] DOB: 1950/03/06 Today's Date: 12/05/2011 Time: 4098-1191 PT Time Calculation (min): 24 min  PT Assessment / Plan / Recommendation Comments on Treatment Session  Pt making excellent progress with mobility. Ambulated 58' with RW and supervision. Likely will be ready to DC home tomorrow from PT standpoint. Will plan to do stair training tomorrow.     Follow Up Recommendations  Home health PT     Does the patient have the potential to tolerate intense rehabilitation     Barriers to Discharge None      Equipment Recommendations  Rolling walker with 5" wheels;Tub/shower seat (pt stated he may be able to borrow equipment from a friend)    Recommendations for Other Services OT consult  Frequency 7X/week   Plan Discharge plan remains appropriate;Frequency remains appropriate    Precautions / Restrictions Precautions Precautions: Knee Required Braces or Orthoses: Knee Immobilizer - Left Knee Immobilizer - Left: On except when in CPM Restrictions Weight Bearing Restrictions: No Other Position/Activity Restrictions: WBAT LLE   Pertinent Vitals/Pain *5/10 L knee with walking Premedicated, ice applied after PT**    Mobility  Bed Mobility Bed Mobility: Supine to Sit Supine to Sit: 4: Min assist;HOB elevated Details for Bed Mobility Assistance: min A to support/advance LLE to EOB Transfers Transfers: Sit to Stand;Stand to Sit Sit to Stand: 5: Supervision;From bed Stand to Sit: 5: Supervision;With armrests;To chair/3-in-1 Details for Transfer Assistance: VCs hand placement Ambulation/Gait Ambulation/Gait Assistance: 5: Supervision Ambulation Distance (Feet): 70 Feet Assistive device: Rolling walker Gait Pattern: Step-to pattern General Gait Details: good sequencing with RW, no LOB    Exercises Total Joint Exercises Ankle Circles/Pumps: AROM;Both;10 reps;Supine Quad Sets: AROM;Both;10  reps;Supine Towel Squeeze: AROM;Both;10 reps Heel Slides: AAROM;Left;Supine;15 reps Hip ABduction/ADduction: AAROM;Left;15 reps Straight Leg Raises: AAROM;Left;15 reps   PT Diagnosis: Difficulty walking;Acute pain  PT Problem List: Decreased strength;Decreased range of motion;Decreased activity tolerance;Decreased mobility;Pain PT Treatment Interventions: Gait training;DME instruction;Stair training;Therapeutic activities;Functional mobility training;Therapeutic exercise;Patient/family education   PT Goals Acute Rehab PT Goals PT Goal Formulation: With patient Time For Goal Achievement: 12/08/11 Potential to Achieve Goals: Good Pt will go Sit to Stand: with modified independence PT Goal: Sit to Stand - Progress: Progressing toward goal Pt will Ambulate: >150 feet;with modified independence;with rolling walker PT Goal: Ambulate - Progress: Progressing toward goal Pt will Go Up / Down Stairs: 3-5 stairs;with supervision;with rail(s);with crutches PT Goal: Up/Down Stairs - Progress: Goal set today Pt will Perform Home Exercise Program: Independently PT Goal: Perform Home Exercise Program - Progress: Progressing toward goal  Visit Information  Last PT Received On: 12/05/11 Assistance Needed: +1    Subjective Data  Subjective: My knee feels better when I'm out of bed.  Patient Stated Goal: return to fishing and working   Cognition  Overall Cognitive Status: Appears within functional limits for tasks assessed/performed Arousal/Alertness: Awake/alert Orientation Level: Appears intact for tasks assessed Behavior During Session: [facility] for tasks performed    Balance     End of Session PT - End of Session Equipment Utilized During Treatment: Gait belt;Left knee immobilizer Activity Tolerance: Patient tolerated treatment well Patient left: in chair;with call bell/phone within reach;with family/visitor present Nurse Communication: Mobility status   GP     Ralene Bathe  Kistler 12/05/2011, 12:23 PM [phone]

## 2011-12-05 NOTE — Progress Notes (Signed)
   Subjective: 1 Day Post-Op Procedure(s) (LRB): TOTAL KNEE ARTHROPLASTY (Left) Patient reports pain as mild.   Patient seen in rounds with Dr. Darrelyn Hillock. Patient is well, and has had no acute complaints or problems. He reports that pain is well-controlled. He denies chest pain and shortness of breath. No issues overnight. We will start therapy today.    Objective: Vital signs in last 24 hours: Temp:  [97.3 F (36.3 C)-98.4 F (36.9 C)] 97.8 F (36.6 C) (11/02 0500) Pulse Rate:  [64-78] 68  (11/02 0500) Resp:  [14-18] 14  (11/02 0500) BP: (117-159)/(67-91) 135/78 mmHg (11/02 0500) SpO2:  [94 %-99 %] 99 % (11/02 0500) Weight:  [110.7 kg (244 lb 0.8 oz)] 110.7 kg (244 lb 0.8 oz) (11/01 2036)  Intake/Output from previous day:  Intake/Output Summary (Last 24 hours) at 12/05/11 0859 Last data filed at 12/05/11 0832  Gross per 24 hour  Intake 3637.5 ml  Output    835 ml  Net 2802.5 ml    Intake/Output this shift: Total I/O In: 240 [P.O.:240] Out: -   Labs:  Basename 12/05/11 0532  HGB 14.1    Basename 12/05/11 0532  WBC 12.1*  RBC 4.37  HCT 40.3  PLT 215    Basename 12/05/11 0532  NA 137  K 3.9  CL 103  CO2 25  BUN 25*  CREATININE 1.05  GLUCOSE 145*  CALCIUM 9.2    EXAM General - Patient is Alert and Oriented Extremity - Neurologically intact Intact pulses distally Dorsiflexion/Plantar flexion intact No cellulitis present Dressing - dressing C/D/I Motor Function - intact, moving foot and toes well on exam.  Hemovac pulled without difficulty.  Past Medical History  Diagnosis Date  . Hypertension     dx in the 42s  . Hyperlipidemia     dx in the 90s  . Hypogonadism male   . Erectile dysfunction   . Sleep apnea     does't use cpap    Assessment/Plan: 1 Day Post-Op Procedure(s) (LRB): TOTAL KNEE ARTHROPLASTY (Left) Active Problems:  * No active hospital problems. *   Estimated Body mass index is 36.15 kg/(m^2) as calculated from the  following:   Height as of this encounter: 5' 8.898"[Documented in chart[(1.75 m).   Weight as of this encounter: 244 lb 0.8 oz(110.7 kg). Advance diet Up with therapy  DVT Prophylaxis - Lovenox Weight-Bearing as tolerated to left leg D/C O2 and Pulse OX and try on Room Air  He is progressing well. Will start therapy today.   Nilan Iddings LAUREN 12/05/2011, 8:59 AM

## 2011-12-05 NOTE — Evaluation (Signed)
Occupational Therapy Evaluation Patient Details Name: Jimmy Valentine MRN: [id number] DOB: Aug 09, 1950 Today's Date: 12/05/2011 Time: 4098-1191 OT Time Calculation (min): 17 min  OT Assessment / Plan / Recommendation Clinical Impression  This 61 y.o. male admitted for Lt. TKA. Pt is doing well first day post op.  Anticipate he will be able to return home at supervision level.  Recommend acute OT for the below listed deficits    OT Assessment  Patient needs continued OT Services    Follow Up Recommendations  No OT follow up;Supervision - Intermittent    Barriers to Discharge None    Equipment Recommendations  Rolling walker with 5" wheels (Pt. and wife instructed in use of 3-in-1 for shower)    Recommendations for Other Services    Frequency  Min 2X/week    Precautions / Restrictions Precautions Precautions: Knee Required Braces or Orthoses: Knee Immobilizer - Left Knee Immobilizer - Left: On except when in CPM Restrictions Weight Bearing Restrictions: No Other Position/Activity Restrictions: WBAT LLE       ADL  Eating/Feeding: Independent Where Assessed - Eating/Feeding: Chair Grooming: Wash/dry hands;Wash/dry face;Teeth care;Brushing hair;Min guard Where Assessed - Grooming: Supported standing Upper Body Bathing: Set up Where Assessed - Upper Body Bathing: Supported sitting Lower Body Bathing: Minimal assistance Where Assessed - Lower Body Bathing: Supported sit to stand Upper Body Dressing: Set up Where Assessed - Upper Body Dressing: Unsupported sitting Lower Body Dressing: Minimal assistance Where Assessed - Lower Body Dressing: Supported sit to Pharmacist, hospital: Minimal assistance Statistician Method: Sit to Barista: Raised toilet seat with arms (or 3-in-1 over toilet) Toileting - Clothing Manipulation and Hygiene: Minimal assistance Where Assessed - Engineer, mining and Hygiene: Standing Equipment Used: Rolling  walker Transfers/Ambulation Related to ADLs: min A ADL Comments: Pt. and wife instructed in safe technique for shower transfer and use of 3-in-1 commode as a shower seat.  pt. able to reach Lt. foot, but unable to remove sock due to pain/discomfort; however, pt. likely can perform if he were seated in a lower chair/surface.  Wife somewhat apprehensive of pt's ability to perform ADLs at discharge    OT Diagnosis: Generalized weakness;Acute pain  OT Problem List: Decreased strength;Decreased activity tolerance;Decreased knowledge of use of DME or AE;Obesity;Pain OT Treatment Interventions: Self-care/ADL training;Therapeutic activities;Patient/family education;DME and/or AE instruction   OT Goals Acute Rehab OT Goals OT Goal Formulation: With patient Time For Goal Achievement: 12/12/11 Potential to Achieve Goals: Good ADL Goals Pt Will Perform Grooming: with modified independence;Standing at sink ADL Goal: Grooming - Progress: Goal set today Pt Will Perform Lower Body Bathing: with modified independence;Sit to stand from chair;Sit to stand from bed ADL Goal: Lower Body Bathing - Progress: Goal set today Pt Will Perform Lower Body Dressing: with modified independence;Sit to stand from chair;Sit to stand from bed ADL Goal: Lower Body Dressing - Progress: Goal set today Pt Will Transfer to Toilet: with modified independence;Ambulation;3-in-1 ADL Goal: Toilet Transfer - Progress: Goal set today Pt Will Perform Toileting - Clothing Manipulation: with modified independence;Standing ADL Goal: Toileting - Clothing Manipulation - Progress: Goal set today Pt Will Perform Tub/Shower Transfer: with supervision;Ambulation;with DME (3-in-1) ADL Goal: Tub/Shower Transfer - Progress: Goal set today  Visit Information  Last OT Received On: 12/05/11 Assistance Needed: +1    Subjective Data  Subjective: "If I were sitting in that chair (lower surface), I think I can do it" Patient Stated Goal: To go  home   Prior Functioning  Home Living Lives With: Spouse Available Help at Discharge: Family Type of Home: House Home Access: Stairs to enter Secretary/administrator of Steps: 3 Entrance Stairs-Rails: Right;Left Home Layout: One level Bathroom Shower/Tub: Health visitor: Standard Bathroom Accessibility: Yes How Accessible: Accessible via walker Home Adaptive Equipment: Bedside commode/3-in-1 Additional Comments: pt stated he can borrow RW from friend Prior Function Level of Independence: Independent Able to Take Stairs?: Yes Driving: Yes Vocation: Full time employment Comments: traveling Adult nurse Communication: No difficulties         Vision/Perception     Cognition  Overall Cognitive Status: Appears within functional limits for tasks assessed/performed Arousal/Alertness: Awake/alert Orientation Level: Appears intact for tasks assessed Behavior During Session: [facility] for tasks performed    Extremity/Trunk Assessment Right Upper Extremity Assessment RUE ROM/Strength/Tone: WFL for tasks assessed RUE Coordination: WFL - gross/fine motor Left Upper Extremity Assessment LUE ROM/Strength/Tone: WFL for tasks assessed LUE Coordination: WFL - gross/fine motor Right Lower Extremity Assessment RLE ROM/Strength/Tone: Within functional levels RLE Sensation: WFL - Light Touch RLE Coordination: WFL - gross/fine motor Left Lower Extremity Assessment LLE ROM/Strength/Tone: Deficits;Due to pain LLE ROM/Strength/Tone Deficits: L knee flexion approx 40*, limited by pain; ankle strength/ROM WNL; hip SLR -3/5 LLE Sensation: WFL - Light Touch LLE Coordination: WFL - gross/fine motor Trunk Assessment Trunk Assessment: Normal     Mobility Bed Mobility Bed Mobility: Supine to Sit Supine to Sit: 4: Min assist;HOB elevated Details for Bed Mobility Assistance: min A to support/advance LLE to EOB Transfers Transfers: Sit to Stand;Stand to Sit Sit to  Stand: 5: Supervision;With upper extremity assist;From chair/3-in-1 Stand to Sit: 5: Supervision;With upper extremity assist;To chair/3-in-1 Details for Transfer Assistance: VCs hand placement     Shoulder Instructions     Exercise Total Joint Exercises Ankle Circles/Pumps: AROM;Both;10 reps;Supine Quad Sets: AROM;Both;10 reps;Supine Towel Squeeze: AROM;Both;10 reps Heel Slides: AAROM;Left;Supine;15 reps Hip ABduction/ADduction: AAROM;Left;15 reps Straight Leg Raises: AAROM;Left;15 reps   Balance     End of Session OT - End of Session Activity Tolerance: Patient tolerated treatment well Patient left: in chair;with call bell/phone within reach;with nursing in room;with family/visitor present Nurse Communication: Mobility status  GO     Keslie Gritz, Ursula Alert M 12/05/2011, 1:08 PM

## 2011-12-05 NOTE — Progress Notes (Signed)
Clinical Social Work Department BRIEF PSYCHOSOCIAL ASSESSMENT 12/05/2011  Patient:  Jimmy Valentine, Jimmy Valentine     Account Number:  [id number]     Admit date:  12/04/2011  Clinical Social Worker:  Leron Croak, CLINICAL SOCIAL WORKER  Date/Time:  12/05/2011 03:34 PM  Referred by:  Physician  Date Referred:  12/04/2011 Referred for  SNF Placement   Other Referral:   Interview type:  Patient Other interview type:   Pt's wife was in the room for d/c planning.    PSYCHOSOCIAL DATA Living Status:  WIFE Admitted from facility:   Level of care:   Primary support name:  Sanjuan Sawa Primary support relationship to patient:  SPOUSE Degree of support available:   Pt has good support from his wife and family.    CURRENT CONCERNS Current Concerns  Post-Acute Placement   Other Concerns:    SOCIAL WORK ASSESSMENT / PLAN CSW met with Pt and wife at the bedside to discuss PT's recommendation for SNF placement at time of D/C. Pt and wife were not aware of the recommendation. Pt and wife do not want SNF placement at this time and would like to have HHPT do the rehab. CSW noted the Pt's preference and informed Pt's nursing staff of decision.   Assessment/plan status:  Information/Referral to [facility] Other assessment/ plan:   Information/referral to community resources:   CSW provided a listing of SMF facilities in the event that Pt's status changes.    PATIENT'S/FAMILY'S RESPONSE TO PLAN OF CARE: Pt and wife were appreciative for assistance.       Leron Croak, LCSWA [employer] [phone]

## 2011-12-06 LAB — CBC
MCH: 32.4 pg (ref 26.0–34.0)
MCHC: 35.5 g/dL (ref 30.0–36.0)
RBC: 4.08 MIL/uL — ABNORMAL LOW (ref 4.22–5.81)
RDW: 12.3 % (ref 11.5–15.5)
WBC: 12.2 10*3/uL — ABNORMAL HIGH (ref 4.0–10.5)

## 2011-12-06 NOTE — Progress Notes (Signed)
Physical Therapy Treatment Patient Details Name: Jimmy Valentine MRN: [id number] DOB: [birth year] Today's Date: 12/06/2011 Time: 9528-4132 PT Time Calculation (min): 25 min  PT Assessment / Plan / Recommendation Comments on Treatment Session  Pt with some pain, edema and decreased active motion on LLE.  He was able to participate well with PT to increase active motion in leg.  He is doing well with mobility depsite pain and should be able to d/c to home tomrrow with HHPT as planned    Follow Up Recommendations  Home health PT     Does the patient have the potential to tolerate intense rehabilitation     Barriers to Discharge        Equipment Recommendations  None recommended by PT    Recommendations for Other Services    Frequency 7X/week   Plan Discharge plan remains appropriate;Frequency remains appropriate    Precautions / Restrictions Precautions Precautions: Knee Required Braces or Orthoses: Knee Immobilizer - Left Knee Immobilizer - Left: On except when in CPM Restrictions Weight Bearing Restrictions: Yes LLE Weight Bearing: Weight bearing as tolerated   Pertinent Vitals/Pain 1/10 at start of treatment, but pain increased with activity and weight bearing. Pt had premedication and ice packs applies post treament    Mobility  Bed Mobility Bed Mobility: Supine to Sit;Sit to Supine Supine to Sit: 4: Min assist Sit to Supine: 4: Min assist Details for Bed Mobility Assistance: min assist form LLE Transfers Transfers: Sit to Stand;Stand to Sit Sit to Stand: With upper extremity assist;From chair/3-in-1;5: Supervision Stand to Sit: With upper extremity assist;To chair/3-in-1;5: Supervision Details for Transfer Assistance: verbal cues for hand  placement.  pt ususally chooses to pull up on RW Ambulation/Gait Ambulation/Gait Assistance: 5: Supervision Ambulation Distance (Feet): 100 Feet Assistive device: Rolling walker Ambulation/Gait Assistance Details: pt encouaged to  keep chest up hight and push on arms to take weight off of sore leg Gait Pattern: Step-to pattern;Decreased step length - right;Decreased step length - left;Antalgic Gait velocity: decreased General Gait Details: pt appears to have pain with weight bearing on LLE.  He says that it does not ease up much as he continues to walk Stairs: Yes Stairs Assistance: 4: Min assist Stairs Assistance Details (indicate cue type and reason): pt appeas to be limited by pain weight bearing on LLE Stair Management Technique: Two rails Number of Stairs: 3     Exercises Total Joint Exercises Ankle Circles/Pumps: AROM;Both;10 reps;Supine Quad Sets: AROM;Both;10 reps;Supine (pt with weak quad set on LLE) Gluteal Sets: AROM;Standing;5 reps;Both Heel Slides: AROM;Right;10 reps;Supine Hip ABduction/ADduction: AAROM;Left;10 reps;Supine Straight Leg Raises: AAROM;Left;10 reps;Supine Long Arc Quad: AAROM;Left;10 reps;Seated Knee Flexion: AAROM;Left;10 reps;Seated Goniometric ROM: about 60 degress Bridges: AROM;Right;10 reps;Supine Other Exercises Other Exercises: standing balance with weight bearing on both legs, stretching to LLE and attempt to activate quads for balance reactions   PT Diagnosis:    PT Problem List:   PT Treatment Interventions:     PT Goals Acute Rehab PT Goals PT Goal: Sit to Stand - Progress: Progressing toward goal PT Goal: Ambulate - Progress: Progressing toward goal PT Goal: Up/Down Stairs - Progress: Progressing toward goal PT Goal: Perform Home Exercise Program - Progress: Progressing toward goal  Visit Information  Last PT Received On: 12/06/11 Assistance Needed: +1    Subjective Data  Subjective: I've had my other knee  done too. Patient Stated Goal: to go home tomorrow   Cognition  Overall Cognitive Status: Appears within functional limits for tasks assessed/performed Arousal/Alertness: Awake/alert Orientation  Level: Appears intact for tasks assessed Behavior During  Session: [facility] for tasks performed    Balance     End of Session PT - End of Session Equipment Utilized During Treatment: Left knee immobilizer Activity Tolerance: Patient tolerated treatment well Patient left: in chair;with call bell/phone within reach;with nursing in room Nurse Communication: Mobility status;Patient requests pain meds CPM Left Knee CPM Left Knee: Off   GP     Donnetta Hail 12/06/2011, 11:24 AM

## 2011-12-06 NOTE — Progress Notes (Signed)
Occupational Therapy Treatment Patient Details Name: Jimmy Valentine MRN: [id number] DOB: 09-11-1950 Today's Date: 12/06/2011 Time: 4696-2952 OT Time Calculation (min): 21 min  OT Assessment / Plan / Recommendation Comments on Treatment Session Pt states pain was 5/10 during session which made stepping over shower ledge a little difficult and initially putting weight down on L LE.     Follow Up Recommendations  No OT follow up;Supervision/Assistance - 24 hour    Barriers to Discharge       Equipment Recommendations  Rolling walker with 5" wheels;None recommended by OT    Recommendations for Other Services    Frequency Min 2X/week   Plan Discharge plan remains appropriate    Precautions / Restrictions Precautions Precautions: Knee Required Braces or Orthoses: Knee Immobilizer - Left Knee Immobilizer - Left: On except when in CPM Restrictions Weight Bearing Restrictions: No        ADL  Toilet Transfer: Performed;Minimal assistance Toilet Transfer Equipment: Raised toilet seat with arms (or 3-in-1 over toilet) Tub/Shower Transfer: Performed;Minimal assistance Tub/Shower Transfer Method: Other (comment) (step back over ledge backwards) Equipment Used: Rolling walker ADL Comments: Educated pt on importance of KI until able to SLR and that he would need to wear KI to step in and out of shower if not able to by the time he showers. Pt states he may sponge bathe a few more days at home like last surgery. Pt needed cues througout functional transfer practice to reach back for armrests of chair/3in1 and push up from armrests. He tends to pull up on RW. Pt states wife can help with socks and pants till he can reach to feet. Initially had difficulty putting weight down through LLE (pt states pain increase after PT session earlier) but as he moved more, he was able to put weight down on L LE.     OT Diagnosis:    OT Problem List:   OT Treatment Interventions:     OT Goals ADL Goals ADL  Goal: Toilet Transfer - Progress: Not progressing (pt needed some steady assist. ? due to pain) ADL Goal: Tub/Shower Transfer - Progress: Progressing toward goals  Visit Information  Last OT Received On: 12/06/11 Assistance Needed: +1    Subjective Data  Subjective: I am going to stay an extra day Patient Stated Goal: home when ready   Prior Functioning       Cognition  Overall Cognitive Status: Appears within functional limits for tasks assessed/performed Arousal/Alertness: Awake/alert Orientation Level: Appears intact for tasks assessed Behavior During Session: [facility] for tasks performed    Mobility  Shoulder Instructions Transfers Transfers: Sit to Stand;Stand to Sit Sit to Stand: 4: Min assist;With upper extremity assist;From chair/3-in-1 Stand to Sit: 4: Min assist;With upper extremity assist;To chair/3-in-1 Details for Transfer Assistance: verbal cues for hand placement       Exercises      Balance     End of Session OT - End of Session Activity Tolerance: Patient limited by pain Patient left: in chair;with call bell/phone within reach CPM Left Knee CPM Left Knee: Off  GO     Lennox Laity [phone] 12/06/2011, 11:12 AM

## 2011-12-06 NOTE — Progress Notes (Signed)
Subjective: Patient denies any significant problems with only a tolerated the CPM yesterday for 2 hours. Feels he needs to be a little bit more independent before being discharged home with his wife for him difficulties helping him in and out of a chair into bed. Patient denies any shortness breath chest discomfort   Objective: Vital signs in last 24 hours: Temp:  [98 F (36.7 C)-99.6 F (37.6 C)] 98.6 F (37 C) (11/03 0609) Pulse Rate:  [70-88] 84  (11/03 0609) Resp:  [14-70] 16  (11/03 0609) BP: (120-146)/(73-82) 128/73 mmHg (11/03 0609) SpO2:  [91 %-93 %] 92 % (11/03 0609)  Intake/Output from previous day: 11/02 0701 - 11/03 0700 In: 1921.3 [P.O.:1200; I.V.:721.3] Out: 1375 [Urine:1375] Intake/Output this shift: Total I/O In: -  Out: 300 [Urine:300]   Basename 12/06/11 0504 12/05/11 0532  HGB 13.2 14.1    Basename 12/06/11 0504 12/05/11 0532  WBC 12.2* 12.1*  RBC 4.08* 4.37  HCT 37.2* 40.3  PLT 198 215    Basename 12/05/11 0532  NA 137  K 3.9  CL 103  CO2 25  BUN 25*  CREATININE 1.05  GLUCOSE 145*  CALCIUM 9.2   No results found for this basename: LABPT:2,INR:2 in the last 72 hours  Patient's conscious alert and appropriate appears to be very comfortable in no distress his left knee wound is well approximated with Steri-Strips has a little bit of a serosanguineous drainage no pressure blisters he does have little bit of soft tissue swelling no signs of infection his thigh are soft and nontender his leg is neuromotor vascularly intact  Assessment/Plan: Postop day #2 status post left total knee arthroplasty doing well Hypertension stable Obstructive sleep apnea asymptomatic Gout asymptomatic   Plan patient will continue with physical therapy today we discussed the need for his leg extension he'll put a pillow underneath his heel. He is also going to try to adjust the CPM where he doesn't 2 hours in the morning 2 hours in the afternoon and 2 hours in the  evening. He should continue with physical therapy here in the hospital today will discharge him home in the morning as he feels he needs to be more dependent since his wife is in a difficult time caring for him.   Jamelle Rushing 12/06/2011, 7:33 AM

## 2011-12-06 NOTE — Progress Notes (Signed)
  Physical Therapy Treatment 12/06/11 1301  PT Visit Information  Last PT Received On 12/06/11  Assistance Needed +1  PT Time Calculation  PT Start Time 1243  PT Stop Time 1301  PT Time Calculation (min) 18 min  Subjective Data  Subjective I can tell its getting better  Patient Stated Goal to go home tomorrow  Precautions  Precautions Knee  Required Braces or Orthoses Knee Immobilizer - Left  Knee Immobilizer - Left On except when in CPM  Restrictions  Weight Bearing Restrictions Yes  LLE Weight Bearing WBAT  Cognition  Overall Cognitive Status Appears within functional limits for tasks assessed/performed  Arousal/Alertness Awake/alert  Orientation Level Appears intact for tasks assessed  Behavior During Session [facility] for tasks performed  Bed Mobility  Bed Mobility Supine to Sit;Sit to Supine  Supine to Sit 4: Min assist  Sit to Supine 4: Min assist  Details for Bed Mobility Assistance min assist form LLE  Transfers  Transfers Sit to Stand;Stand to Sit  Sit to Stand With upper extremity assist;From chair/3-in-1;5: Supervision  Stand to Sit With upper extremity assist;To chair/3-in-1;5: Supervision  Details for Transfer Assistance verbal cues for hand  placement.  pt ususally chooses to pull up on RW  Ambulation/Gait  Ambulation/Gait Assistance 5: Supervision  Ambulation Distance (Feet) 100 Feet  Assistive device Rolling walker  Ambulation/Gait Assistance Details improved step length initailly, Pt with decrease in technique with fatigue  Gait Pattern Step-to pattern;Decreased step length - right;Decreased step length - left;Antalgic  Gait velocity decreased  General Gait Details Improved this session  Stairs Yes  Stairs Assistance 4: Min assist  Stair Management Technique One rail Right;With crutches (on crutch)  Number of Stairs 5   Wheelchair Mobility  Wheelchair Mobility No  Total Joint Exercises  Ankle Circles/Pumps AROM;Both;10 reps;Supine  Quad Sets AROM;Both;10  reps;Supine (better quad set this session)  Heel Slides 10 reps;Supine;AAROM;Left  Hip ABduction/ADduction AAROM;Left;10 reps;Supine  Straight Leg Raises AAROM;Left;10 reps;Supine  Gluteal Sets AROM;Standing;5 reps;Both  Short Arc [employer];Left;15 reps;Supine  PT - End of Session  Equipment Utilized During Treatment Left knee immobilizer  Activity Tolerance Patient tolerated treatment well  Patient left in chair;with call bell/phone within reach;with nursing in room  Nurse Communication Mobility status;Patient requests pain meds  PT - Assessment/Plan  Comments on Treatment Session Improved this session with better exercise performance and ease of movement. Recommend pt ambulate with family/nursing this evening with KI in place.  Will practice steps and perform exercise session one more time in am prior to d/c to home  PT Plan Discharge plan remains appropriate;Frequency remains appropriate  PT Frequency 7X/week  Follow Up Recommendations Home health PT  Equipment Recommended None recommended by PT  Acute Rehab PT Goals  PT Goal: Sit to Stand - Progress Progressing toward goal  PT Goal: Ambulate - Progress Progressing toward goal  PT Goal: Up/Down Stairs - Progress Progressing toward goal  PT Goal: Perform Home Exercise Program - Progress Progressing toward goal  PT General Charges  $$ ACUTE PT VISIT 1 Procedure  PT Treatments  $Gait Training 8-22 mins

## 2011-12-07 LAB — CBC
MCV: 91 fL (ref 78.0–100.0)
Platelets: 202 10*3/uL (ref 150–400)
RDW: 12.3 % (ref 11.5–15.5)
WBC: 13.3 10*3/uL — ABNORMAL HIGH (ref 4.0–10.5)

## 2011-12-07 MED ORDER — METHOCARBAMOL 500 MG PO TABS: 500.0000 mg | ORAL_TABLET | Freq: Four times a day (QID) | ORAL | Status: DC | PRN

## 2011-12-07 MED ORDER — OXYCODONE HCL 5 MG PO TABS: 5.0000 mg | ORAL_TABLET | ORAL | Status: DC | PRN

## 2011-12-07 NOTE — Discharge Summary (Signed)
Physician Discharge Summary  Patient ID: Jimmy Valentine MRN: [id number] DOB/AGE: 05/30/1950 61 y.o.  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 Patient then spent 3 days postop course on the orthopedic floor following a total knee protocol without any complications. Patient had his Hemovac drain DC'd on postop day #1 his IV fluids were DC'd on postop day #2. Patient's wound dressing was taken down on postop day #2 his wound was well approximate Steri-Strips there is no signs of infection no pressure blisters no drainage his calf and thigh were soft nontender. Patient worked well daily with physical therapy and CPM. On postop day #3 his vital signs were stable his labs were within normal acceptable limits is not having any complaints or problems he was medically and orthopedically stable ready for discharge home to proceed with home health physical therapy per total knee protocol and a 2 week followup evaluation in the office. Patient will have his Lovenox DC'd and converted over to aspirin twice a day for his DVT prophylaxis  Consults: None  Significant Diagnostic Studies: Routine total knee labs  Treatments: Routine total knee arthroplasty treatments  Discharge Exam: Blood pressure 130/78, pulse 79, temperature 99.9 F (37.7 C),  temperature source Oral, resp. rate 16, height 5' 8.9" (1.75 m), weight 110.7 kg (244 lb 0.8 oz), SpO2 94.00%. Patient's conscious alert and appropriate appears to be in no distress. His left lower extremity is well proximal with Steri-Strips no signs of infection no pressure blisters no drainage his calf soft nontender his leg is neuromotor vascularly intact  Disposition:   Discharge Orders    Future Appointments: Provider: Department: Dept Phone: Center:   04/04/2012 8:00 AM Wanda Plump, MD [facility] at  [place] [phone] [facility]     Future Orders Please Complete By Expires   Diet general      Call MD / Call 911      Comments:   If you experience chest pain or shortness of breath, CALL 911 and be transported to the hospital emergency room.  If you develope a fever above 101 F, pus (white drainage) or increased drainage or redness at the wound, or calf pain, call your surgeon's office.   Increase activity slowly as tolerated      Discharge instructions      Comments:   Patient is to call [phone] for followup appointment in 2 weeks with Dr. Thomasena Edis. Patient is to take a 325 mg aspirin tablet twice a day for the next month. Patient is to keep his wound clean and dry until seen in the office change his dressing on a daily basis.   CPM      Comments:   Continuous passive motion machine (CPM):      Use the CPM from 0 to 60 for 6 hours per day.      You may increase by 10 per day.  You may break it up into 2 or 3 sessions per day.  Use CPM for 2 weeks or until you are told to stop.   TED hose      Comments:   Use stockings (TED hose) for 3 weeks on both leg(s).  You may remove them at night for sleeping.   Change dressing      Comments:   Change dressing on daily with sterile 4 x 4 inch gauze dressing and apply TED hose.  You may clean the incision with alcohol prior to redressing.   Do not put a pillow under the knee. Place it under the heel.            Medication List     As of 12/07/2011  6:54 AM    STOP taking these medications         glucosamine-chondroitin 500-400 MG tablet      ibuprofen 200 MG tablet   Commonly known as: ADVIL,MOTRIN      TAKE these medications         amLODipine 10 MG tablet   Commonly known as: NORVASC   Take 10 mg by mouth daily before breakfast.      methocarbamol 500 MG tablet   Commonly known as: ROBAXIN   Take 1 tablet (500 mg total) by mouth every 6 (six) hours as needed.      oxyCODONE 5 MG immediate release tablet   Commonly known as: Oxy IR/ROXICODONE   Take 1-2 tablets (5-10 mg total) by mouth every 3 (three) hours as needed for pain.      sildenafil 100 MG tablet   Commonly known as: VIAGRA   Take 1 tablet (100 mg total) by mouth daily as needed.      simvastatin 40 MG tablet   Commonly known as: ZOCOR   Take 40 mg by mouth at bedtime.      triamterene-hydrochlorothiazide 37.5-25 MG per tablet   Commonly known as: MAXZIDE-25   TAKE 1 AND 1/2 TABLET BY MOUTH DAILY      valsartan 320 MG tablet   Commonly known as: DIOVAN   Take 320 mg by mouth daily before breakfast.         Signed: Jamelle Rushing 12/07/2011, 6:54 AM

## 2011-12-07 NOTE — Progress Notes (Signed)
Subjective: Patient feels like he is doing buried well he has no complaints his pain is well controlled   Objective: Vital signs in last 24 hours: Temp:  [98.6 F (37 C)-100 F (37.8 C)] 99.9 F (37.7 C) (11/04 0634) Pulse Rate:  [79-101] 79  (11/04 0634) Resp:  [14-16] 16  (11/04 0634) BP: (130-142)/(74-79) 130/78 mmHg (11/04 0634) SpO2:  [90 %-96 %] 94 % (11/04 0634)  Intake/Output from previous day: 11/03 0701 - 11/04 0700 In: 1200 [P.O.:1200] Out: 940 [Urine:940] Intake/Output this shift: Total I/O In: 480 [P.O.:480] Out: 640 [Urine:640]   Basename 12/07/11 0420 12/06/11 0504 12/05/11 0532  HGB 12.9* 13.2 14.1    Basename 12/07/11 0420 12/06/11 0504  WBC 13.3* 12.2*  RBC 3.99* 4.08*  HCT 36.3* 37.2*  PLT 202 198    Basename 12/05/11 0532  NA 137  K 3.9  CL 103  CO2 25  BUN 25*  CREATININE 1.05  GLUCOSE 145*  CALCIUM 9.2   No results found for this basename: LABPT:2,INR:2 in the last 72 hours  Patient's conscious alert and appropriate sit in a hospital bed appears to be in no distress. His left knee wound is well approximated with Steri-Strips has a very slight ecchymosis about the knee no erythema no drainage no pressure blisters his calf and thighs are soft nontender his leg is neuromotor vascularly intact  Assessment/Plan: Postop day #3 status post left total knee arthroplasty doing very well Hypertension stable Hyperlipidemia stable Obstructive sleep apnea asymptomatic Gout asymptomatic  Plan out of bed with physical therapy this morning and discharged home with home health physical therapy CPM and 2 week followup evaluation with by Dr. Thomasena Edis in the office. Patient will be cardioverted over to aspirin twice a day for DVT prophylaxis   Ambreen Tufte W 12/07/2011, 6:43 AM

## 2011-12-07 NOTE — Progress Notes (Signed)
Pt for d/c home today with [facility] PT. IV d/c'd. Dressing CDI to L knee. Dressing supplies given to pt for home use. CPM will be delivered at home per pt report. Wife will assist pt with D/C. D/C instructions & Rx given with verbalized understanding.

## 2011-12-07 NOTE — Progress Notes (Signed)
Physical Therapy Treatment Patient Details Name: Jimmy Valentine MRN: [id number] DOB: 1950/02/29 Today's Date: 12/07/2011 Time: 4098-1191 PT Time Calculation (min): 23 min  PT Assessment / Plan / Recommendation Comments on Treatment Session  Pt ambulated, practiced stairs, and performed a few exercises in preparation for d/c home today.  Pt reports no further questions/concerns about d/c.    Follow Up Recommendations  Home health PT     Does the patient have the potential to tolerate intense rehabilitation     Barriers to Discharge        Equipment Recommendations  None recommended by PT    Recommendations for Other Services    Frequency     Plan Discharge plan remains appropriate;Frequency remains appropriate    Precautions / Restrictions Precautions Precautions: Knee Required Braces or Orthoses: Knee Immobilizer - Left Knee Immobilizer - Left: On except when in CPM Restrictions Weight Bearing Restrictions: No LLE Weight Bearing: Weight bearing as tolerated   Pertinent Vitals/Pain 3/10 L knee, ice applied    Mobility  Transfers Transfers: Sit to Stand;Stand to Sit Sit to Stand: With upper extremity assist;From chair/3-in-1;5: Supervision Stand to Sit: With upper extremity assist;To chair/3-in-1;5: Supervision Details for Transfer Assistance: no verbal cues, good technique Ambulation/Gait Ambulation/Gait Assistance: 5: Supervision Ambulation Distance (Feet): 200 Feet Assistive device: Rolling walker Ambulation/Gait Assistance Details: pt ambulated to/from stairs, a few short rest breaks due to fatigue Gait Pattern: Step-to pattern;Decreased step length - right;Decreased step length - left;Antalgic Gait velocity: decreased Stairs: Yes Stairs Assistance: 4: Min guard Stairs Assistance Details (indicate cue type and reason): verbal cues for sequence and safe technique Stair Management Technique: Forwards;Step to pattern;One rail Right;With crutches Number of Stairs: 4       Exercises Total Joint Exercises Quad Sets: AROM;Both;10 reps;Supine Short Arc Quad: AROM;Left;10 reps Heel Slides: AAROM;Left;10 reps Hip ABduction/ADduction: AROM;Left;10 reps   PT Diagnosis:    PT Problem List:   PT Treatment Interventions:     PT Goals Acute Rehab PT Goals PT Goal: Sit to Stand - Progress: Progressing toward goal PT Goal: Ambulate - Progress: Progressing toward goal PT Goal: Up/Down Stairs - Progress: Progressing toward goal PT Goal: Perform Home Exercise Program - Progress: Progressing toward goal  Visit Information  Last PT Received On: 12/07/11 Assistance Needed: +1    Subjective Data  Subjective: I think I got it all down.  (after session, no questions about d/c home today)   Cognition  Overall Cognitive Status: Appears within functional limits for tasks assessed/performed    Balance     End of Session PT - End of Session Equipment Utilized During Treatment: Left knee immobilizer;Gait belt Activity Tolerance: Patient tolerated treatment well Patient left: in chair;with call bell/phone within reach CPM Left Knee CPM Left Knee: Off   GP     Jimmy Valentine,KATHrine E 12/07/2011, 9:50 AM Pager: [phone]

## 2011-12-08 ENCOUNTER — Encounter (HOSPITAL_COMMUNITY): Payer: Self-pay | Admitting: Specialist

## 2011-12-09 ENCOUNTER — Telehealth: Payer: Self-pay

## 2011-12-09 NOTE — Telephone Encounter (Signed)
He has been taking that combination for a while and without apparent side effects. Plan is to discuss switch to Lipitor or some other agent when he comes back.

## 2011-12-09 NOTE — Telephone Encounter (Signed)
Allen from [facility](physical therapy )indicated when medications was populated through their pharmacy a major interaction popped up for:  Amlodipine and Simvastatin   This is just a FYI for Dr.Paz

## 2012-03-03 ENCOUNTER — Other Ambulatory Visit: Payer: Self-pay | Admitting: *Deleted

## 2012-03-03 MED ORDER — VALSARTAN 320 MG PO TABS: 320.0000 mg | ORAL_TABLET | Freq: Every day | ORAL | Status: DC

## 2012-03-03 NOTE — Telephone Encounter (Signed)
Refill done.  

## 2012-03-08 ENCOUNTER — Telehealth: Payer: Self-pay | Admitting: Internal Medicine

## 2012-03-08 MED ORDER — VALSARTAN-HYDROCHLOROTHIAZIDE 320-25 MG PO TABS: 1.0000 | ORAL_TABLET | Freq: Every day | ORAL | Status: DC

## 2012-03-08 NOTE — Telephone Encounter (Signed)
Discussed with pt & pharmacy.  Sent new rx to pharmacy.

## 2012-03-08 NOTE — Telephone Encounter (Signed)
Insurance will only cover Diovan generic and apparently only the  Diovan HCT  Tell  the patient  I am okay switching to generic. If is okay with him, the plan is: DiovanHCT320-25  one tablet daily (generic) Discontinue Maxzide Office visit in 3 weeks from today.

## 2012-04-04 ENCOUNTER — Ambulatory Visit (INDEPENDENT_AMBULATORY_CARE_PROVIDER_SITE_OTHER): Payer: BC Managed Care – PPO | Admitting: Internal Medicine

## 2012-04-04 VITALS — BP 132/80 | HR 70 | Wt 232.0 lb

## 2012-04-04 DIAGNOSIS — R7309 Other abnormal glucose: Secondary | ICD-10-CM

## 2012-04-04 DIAGNOSIS — N529 Male erectile dysfunction, unspecified: Secondary | ICD-10-CM

## 2012-04-04 DIAGNOSIS — I10 Essential (primary) hypertension: Secondary | ICD-10-CM

## 2012-04-04 DIAGNOSIS — E785 Hyperlipidemia, unspecified: Secondary | ICD-10-CM

## 2012-04-04 DIAGNOSIS — R739 Hyperglycemia, unspecified: Secondary | ICD-10-CM

## 2012-04-04 LAB — BASIC METABOLIC PANEL
Calcium: 9.6 mg/dL (ref 8.4–10.5)
Chloride: 103 mEq/L (ref 96–112)
Creatinine, Ser: 1.1 mg/dL (ref 0.4–1.5)
Sodium: 139 mEq/L (ref 135–145)

## 2012-04-04 LAB — LIPID PANEL
Cholesterol: 170 mg/dL (ref 0–200)
HDL: 43 mg/dL (ref >39.00)
LDL Cholesterol: 96 mg/dL (ref 0–99)
Triglycerides: 156 mg/dL — ABNORMAL HIGH (ref 0.0–149.0)

## 2012-04-04 LAB — CBC WITH DIFFERENTIAL/PLATELET
Basophils Relative: 0.3 % (ref 0.0–3.0)
Eosinophils Relative: 7.3 % — ABNORMAL HIGH (ref 0.0–5.0)
Lymphocytes Relative: 35.4 % (ref 12.0–46.0)
MCV: 89.2 fl (ref 78.0–100.0)
Neutrophils Relative %: 48 % (ref 43.0–77.0)
RBC: 5.28 Mil/uL (ref 4.22–5.81)
WBC: 7.2 10*3/uL (ref 4.5–10.5)

## 2012-04-04 MED ORDER — SILDENAFIL CITRATE 100 MG PO TABS: 100.0000 mg | ORAL_TABLET | Freq: Every day | ORAL | Status: DC | PRN

## 2012-04-04 MED ORDER — SIMVASTATIN 40 MG PO TABS: 40.0000 mg | ORAL_TABLET | Freq: Every day | ORAL | Status: DC

## 2012-04-04 NOTE — Assessment & Plan Note (Addendum)
Had an eye exam last week Doing better w/ exercise since the last TKR Feet care discussed

## 2012-04-04 NOTE — Assessment & Plan Note (Signed)
Needs a RF

## 2012-04-04 NOTE — Progress Notes (Signed)
  Subjective:    Patient ID: Jimmy Valentine, male    DOB: [birth year], 62 y.o.   MRN: [id number]  HPI Routine office visit DJD, status post knee replacement, doing well, pain is essentially gone. Hypertension, recently we changed his medication due to to the insurance issue, good compliance, no side effects, ambulatory BPs reportedly normal. High cholesterol, needs a prescription refill. Erectile dysfunction, needs a prescription.  Past Medical History  Diagnosis Date  . Hypertension 1980s  . Hyperlipidemia 1990s  . 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 Erectile dysfunction   . Sleep apnea     does't use cpap  . Elevated LFTs    Past Surgical History  Procedure Laterality Date  . Total knee arthroplasty Right 2005  . Total knee arthroplasty  12/04/2011    Procedure: TOTAL KNEE ARTHROPLASTY;  Surgeon: Eugenia Mcalpine, MD;  Location: [facility];  Service: Orthopedics;  Laterality: Left;     Review of Systems Denies chest pain or shortness or breath No nausea, vomiting, diarrhea. Has sleep apnea, not on a CPAP, denies any problems with lack of energy at this point.     Objective:   Physical Exam BP 132/80  Pulse 70  Wt 232 lb (105.235 kg)  BMI 34.36 kg/m2  SpO2 98%  General -- alert, well-developed  Lungs -- normal respiratory effort, no intercostal retractions, no accessory muscle use, and normal breath sounds.   Heart-- normal rate, regular rhythm, no murmur, and no gallop.   Extremities-- no pretibial edema bilaterally,walks w/o difficulty  Neurologic-- alert & oriented X3 and strength normal in all extremities. Psych-- Cognition and judgment appear intact. Alert and cooperative with normal attention span and concentration.  not anxious appearing and not depressed appearing.       Assessment & Plan:

## 2012-04-04 NOTE — Patient Instructions (Addendum)

## 2012-04-04 NOTE — Assessment & Plan Note (Signed)
Change meds from diovan-maxzide to diovanHCT Labs amb BP wnl

## 2012-04-04 NOTE — Assessment & Plan Note (Signed)
Due for labs , RF

## 2012-04-05 ENCOUNTER — Encounter: Payer: Self-pay | Admitting: Internal Medicine

## 2012-04-05 ENCOUNTER — Encounter: Payer: Self-pay | Admitting: *Deleted

## 2012-06-19 ENCOUNTER — Other Ambulatory Visit: Payer: Self-pay | Admitting: Internal Medicine

## 2012-06-20 NOTE — Telephone Encounter (Signed)
Refill done.  

## 2012-10-12 ENCOUNTER — Encounter: Payer: Self-pay | Admitting: Internal Medicine

## 2012-10-12 ENCOUNTER — Ambulatory Visit (INDEPENDENT_AMBULATORY_CARE_PROVIDER_SITE_OTHER): Payer: BC Managed Care – PPO | Admitting: Internal Medicine

## 2012-10-12 VITALS — BP 131/84 | HR 61 | Temp 97.8°F | Ht 69.3 in | Wt 237.0 lb

## 2012-10-12 DIAGNOSIS — M109 Gout, unspecified: Secondary | ICD-10-CM

## 2012-10-12 DIAGNOSIS — Z0000 Encounter for general adult medical examination without abnormal findings: Secondary | ICD-10-CM

## 2012-10-12 DIAGNOSIS — E291 Testicular hypofunction: Secondary | ICD-10-CM

## 2012-10-12 DIAGNOSIS — G4733 Obstructive sleep apnea (adult) (pediatric): Secondary | ICD-10-CM

## 2012-10-12 DIAGNOSIS — Z23 Encounter for immunization: Secondary | ICD-10-CM

## 2012-10-12 LAB — BASIC METABOLIC PANEL
BUN: 24 mg/dL — ABNORMAL HIGH (ref 6–23)
CO2: 27 mEq/L (ref 19–32)
Calcium: 9.6 mg/dL (ref 8.4–10.5)
Glucose, Bld: 139 mg/dL — ABNORMAL HIGH (ref 70–99)
Sodium: 137 mEq/L (ref 135–145)

## 2012-10-12 LAB — PSA: PSA: 1 ng/mL (ref 0.10–4.00)

## 2012-10-12 MED ORDER — COLCHICINE 0.6 MG PO TABS: 0.6000 mg | ORAL_TABLET | Freq: Two times a day (BID) | ORAL | Status: DC | PRN

## 2012-10-12 NOTE — Assessment & Plan Note (Signed)
Wonders if he should take HRT, previously  when he did he did not feel any better subjectively. We agreed he will think about it. Check a bone density test

## 2012-10-12 NOTE — Progress Notes (Signed)
  Subjective:    Patient ID: Jimmy Valentine, male    DOB: 02/28/1950, 62 y.o.   MRN: [id number]  HPI CPX Past Medical History  Diagnosis Date  . Hypertension 1980s  . Hyperlipidemia 1990s  . 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 Erectile dysfunction   . Sleep apnea     does't use cpap  . Elevated LFTs    Past Surgical History  Procedure Laterality Date  . Total knee arthroplasty Right 2005  . Total knee arthroplasty  12/04/2011    Procedure: TOTAL KNEE ARTHROPLASTY;  Surgeon: Eugenia Mcalpine, MD;  Location: [facility];  Service: Orthopedics;  Laterality: Left;   History   Social History  . Marital Status: Married    Spouse Name: married    Number of Children: 0  . Years of Education: N/A   Occupational History  . sales rep    Social History Main Topics  . Smoking status: Former Smoker -- 1.00 packs/day for 15 years    Types: Cigarettes    Quit date: 12/04/1995  . Smokeless tobacco: Never Used  . Alcohol Use: 6.0 oz/week    12 drink(s) per week     Comment: social drinker  . Drug Use: No  . Sexual Activity: Not Currently   Other Topics Concern  . Not on file   Social History Narrative   Married, 2 step kids          Family History  Problem Relation Age of Onset  . Hypertension Mother   . Diabetes Sister   . Colon cancer Neg Hx   . Prostate cancer Neg Hx   . Coronary artery disease Mother     M CABG at age 58s    Review of Systems Regular --- trying to eat a little healthier. Exercise---active, no routine exercise Denies chest pain, shortness or breath. Sometimes as is the bruising but denies blood in the stools or blood in the urine. No nausea, vomiting, diarrhea. No difficulty urinating. Libido  slightly decreased.     Objective:   Physical Exam BP 131/84  Pulse 61  Temp(Src) 97.8 F (36.6 C)  Ht 5' 9.3" (1.76 m)  Wt 237 lb (107.502 kg)  BMI 34.7 kg/m2  SpO2 97% General --  alert, well-developed, NAD.  Neck --no thyromegaly , normal carotid pulse Lungs -- normal respiratory effort, no intercostal retractions, no accessory muscle use, and normal breath sounds.  Heart-- normal rate, regular rhythm, no murmur.  Abdomen-- Not distended, good bowel sounds,soft, non-tender. No rebound or rigidity. Rectal-- ext hemorrhoid noted. Normal sphincter tone. No rectal masses or tenderness. Brown stool, Hemoccult negative  Prostate: Prostate gland firm and smooth, no enlargement, nodularity, tenderness, mass, asymmetry or induration. Extremities-- no pretibial edema bilaterally  Neurologic-- alert & oriented X3. Speech, gait normal. Strength normal in all extremities.  Psych-- Cognition and judgment appear intact. Alert and cooperative with normal attention span and concentration. not anxious appearing and not depressed appearing.       Assessment & Plan:

## 2012-10-12 NOTE — Assessment & Plan Note (Addendum)
TD today Flu shot  -- today 2011 shingles shot   2011 pneumonia shot  Cscope at age 62, normal, Dr Candelaria Stagers, next 10 years. hemocult neg today  Labs Diet-exercise discussed

## 2012-10-12 NOTE — Assessment & Plan Note (Signed)
Energy level is okay in the morning, tired in the afternoon but not sleepy. Encouraged to work on diet and exercise, lose some weight

## 2012-10-12 NOTE — Patient Instructions (Signed)
Get your blood work before you leave  Next visit in 6 months for a routine office visit Please make an appointment before you leave the office today (or call few weeks in advance)     

## 2012-10-18 ENCOUNTER — Encounter: Payer: Self-pay | Admitting: *Deleted

## 2012-10-18 ENCOUNTER — Telehealth: Payer: Self-pay | Admitting: *Deleted

## 2012-10-18 NOTE — Telephone Encounter (Signed)
Letter sent. DJR  

## 2012-10-18 NOTE — Telephone Encounter (Signed)
Message copied by Eustace Quail on Tue Oct 18, 2012  8:14 AM ------      Message from: Willow Ora E      Created: Mon Oct 17, 2012  5:13 PM       Send a letter:      Peyton Najjar,      Your diabetes is under excellent control.      The liver, kidney and prostate tests are normal.      Good results, continue taking the same meds ! ------

## 2012-11-10 ENCOUNTER — Ambulatory Visit (INDEPENDENT_AMBULATORY_CARE_PROVIDER_SITE_OTHER)
Admission: RE | Admit: 2012-11-10 | Discharge: 2012-11-10 | Disposition: A | Discharge status: Home / Self Care | Payer: BC Managed Care – PPO | Source: Ambulatory Visit | Attending: Internal Medicine | Admitting: Internal Medicine

## 2012-11-10 DIAGNOSIS — E291 Testicular hypofunction: Secondary | ICD-10-CM

## 2012-11-14 ENCOUNTER — Encounter: Payer: Self-pay | Admitting: *Deleted

## 2012-11-14 NOTE — Progress Notes (Signed)
Letter mailed to patient.

## 2012-11-28 ENCOUNTER — Other Ambulatory Visit: Payer: Self-pay | Admitting: Internal Medicine

## 2012-11-30 ENCOUNTER — Other Ambulatory Visit: Payer: Self-pay | Admitting: *Deleted

## 2012-11-30 MED ORDER — VALSARTAN-HYDROCHLOROTHIAZIDE 320-25 MG PO TABS: 1.0000 | ORAL_TABLET | Freq: Every day | ORAL | Status: DC

## 2012-11-30 MED ORDER — AMLODIPINE BESYLATE 10 MG PO TABS: ORAL_TABLET | ORAL | Status: DC

## 2012-11-30 NOTE — Telephone Encounter (Signed)
Valsartan and amlodipine refills sent to pharmacy

## 2012-12-26 ENCOUNTER — Other Ambulatory Visit: Payer: Self-pay | Admitting: Internal Medicine

## 2012-12-31 IMAGING — CR DG CHEST 2V
2 series · 2 of 2 positions shown · non-contrast
Comparison: None.

CLINICAL DATA: Left total knee arthroplasty.  Nonsmoker

CHEST - 2 VIEW

[w chest pa]
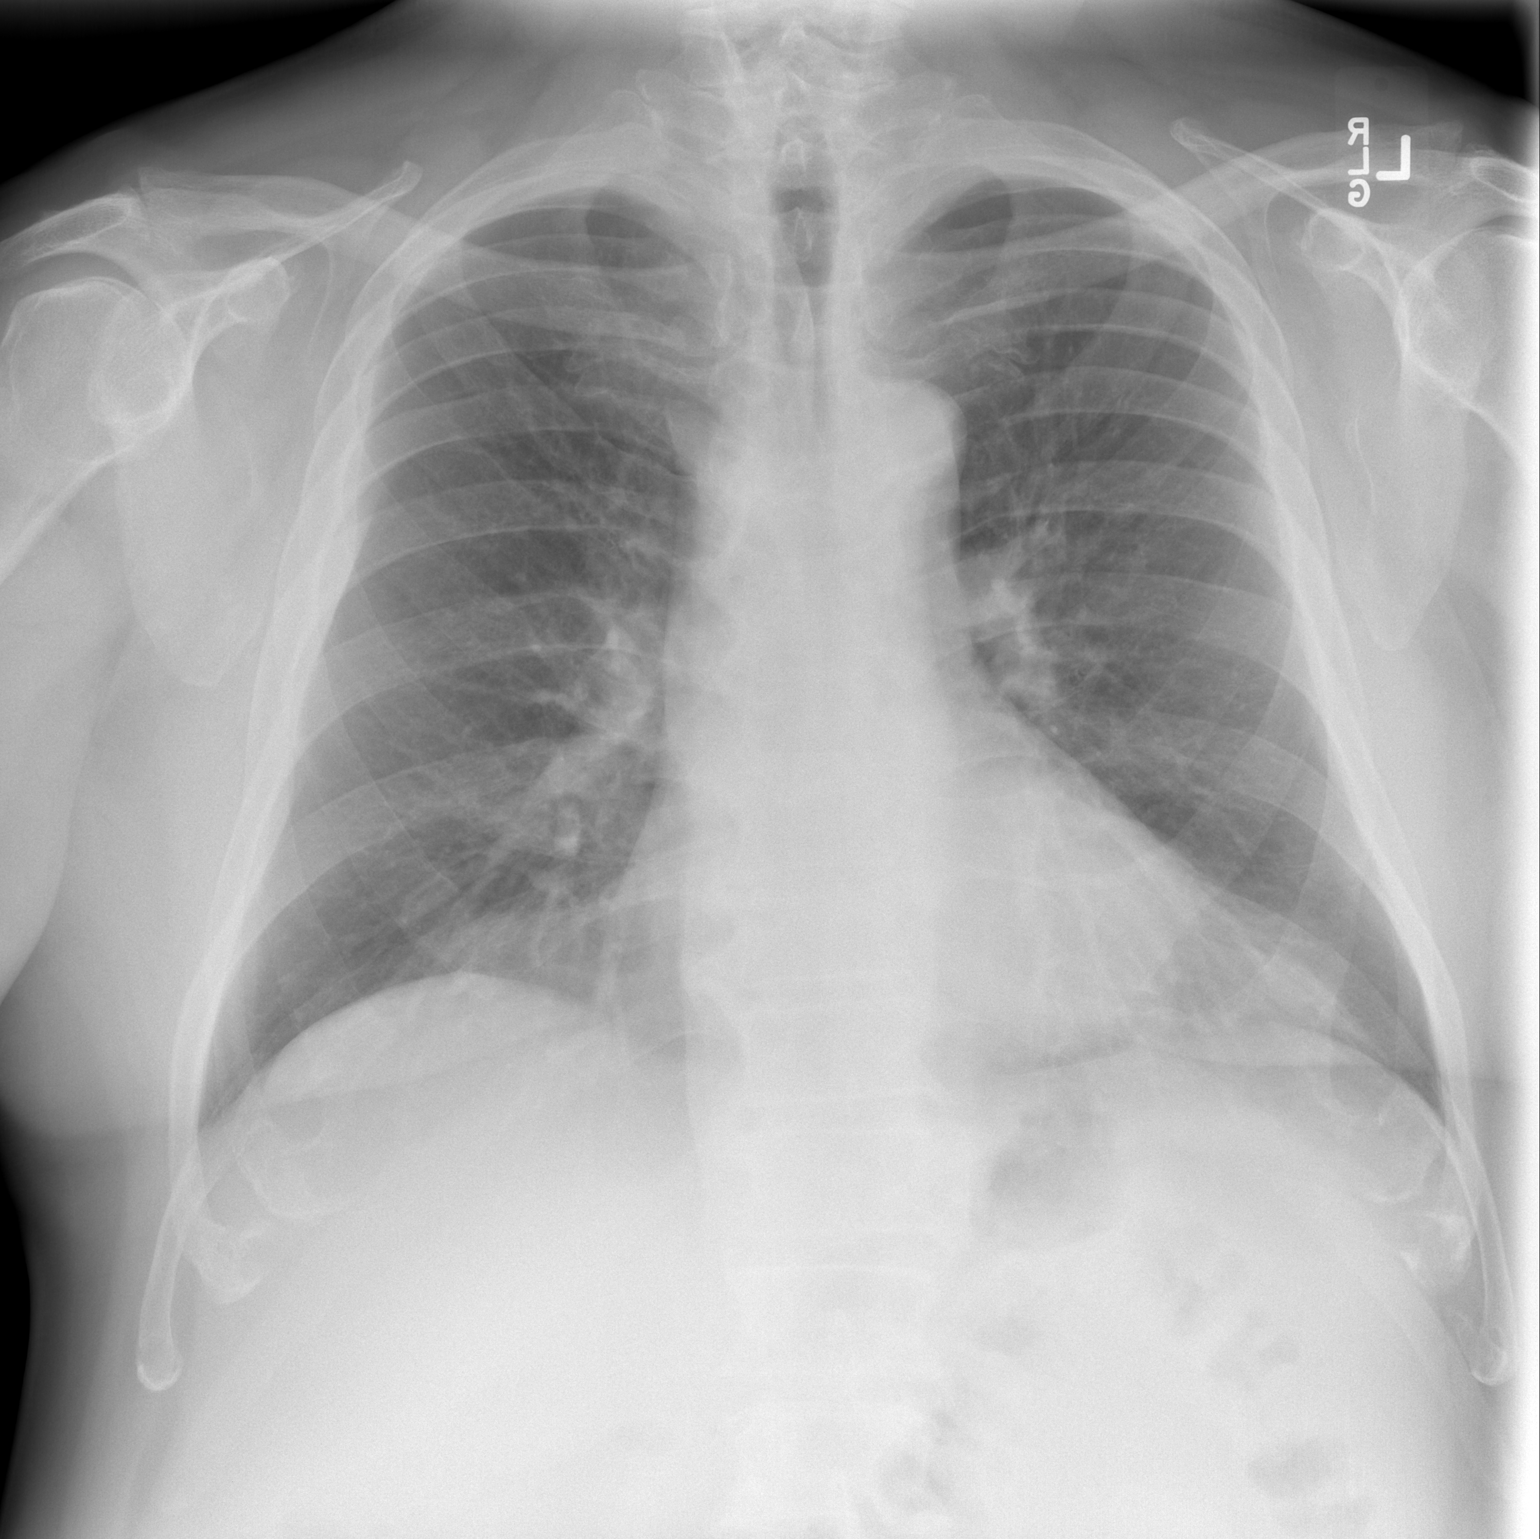

[w chest lat]
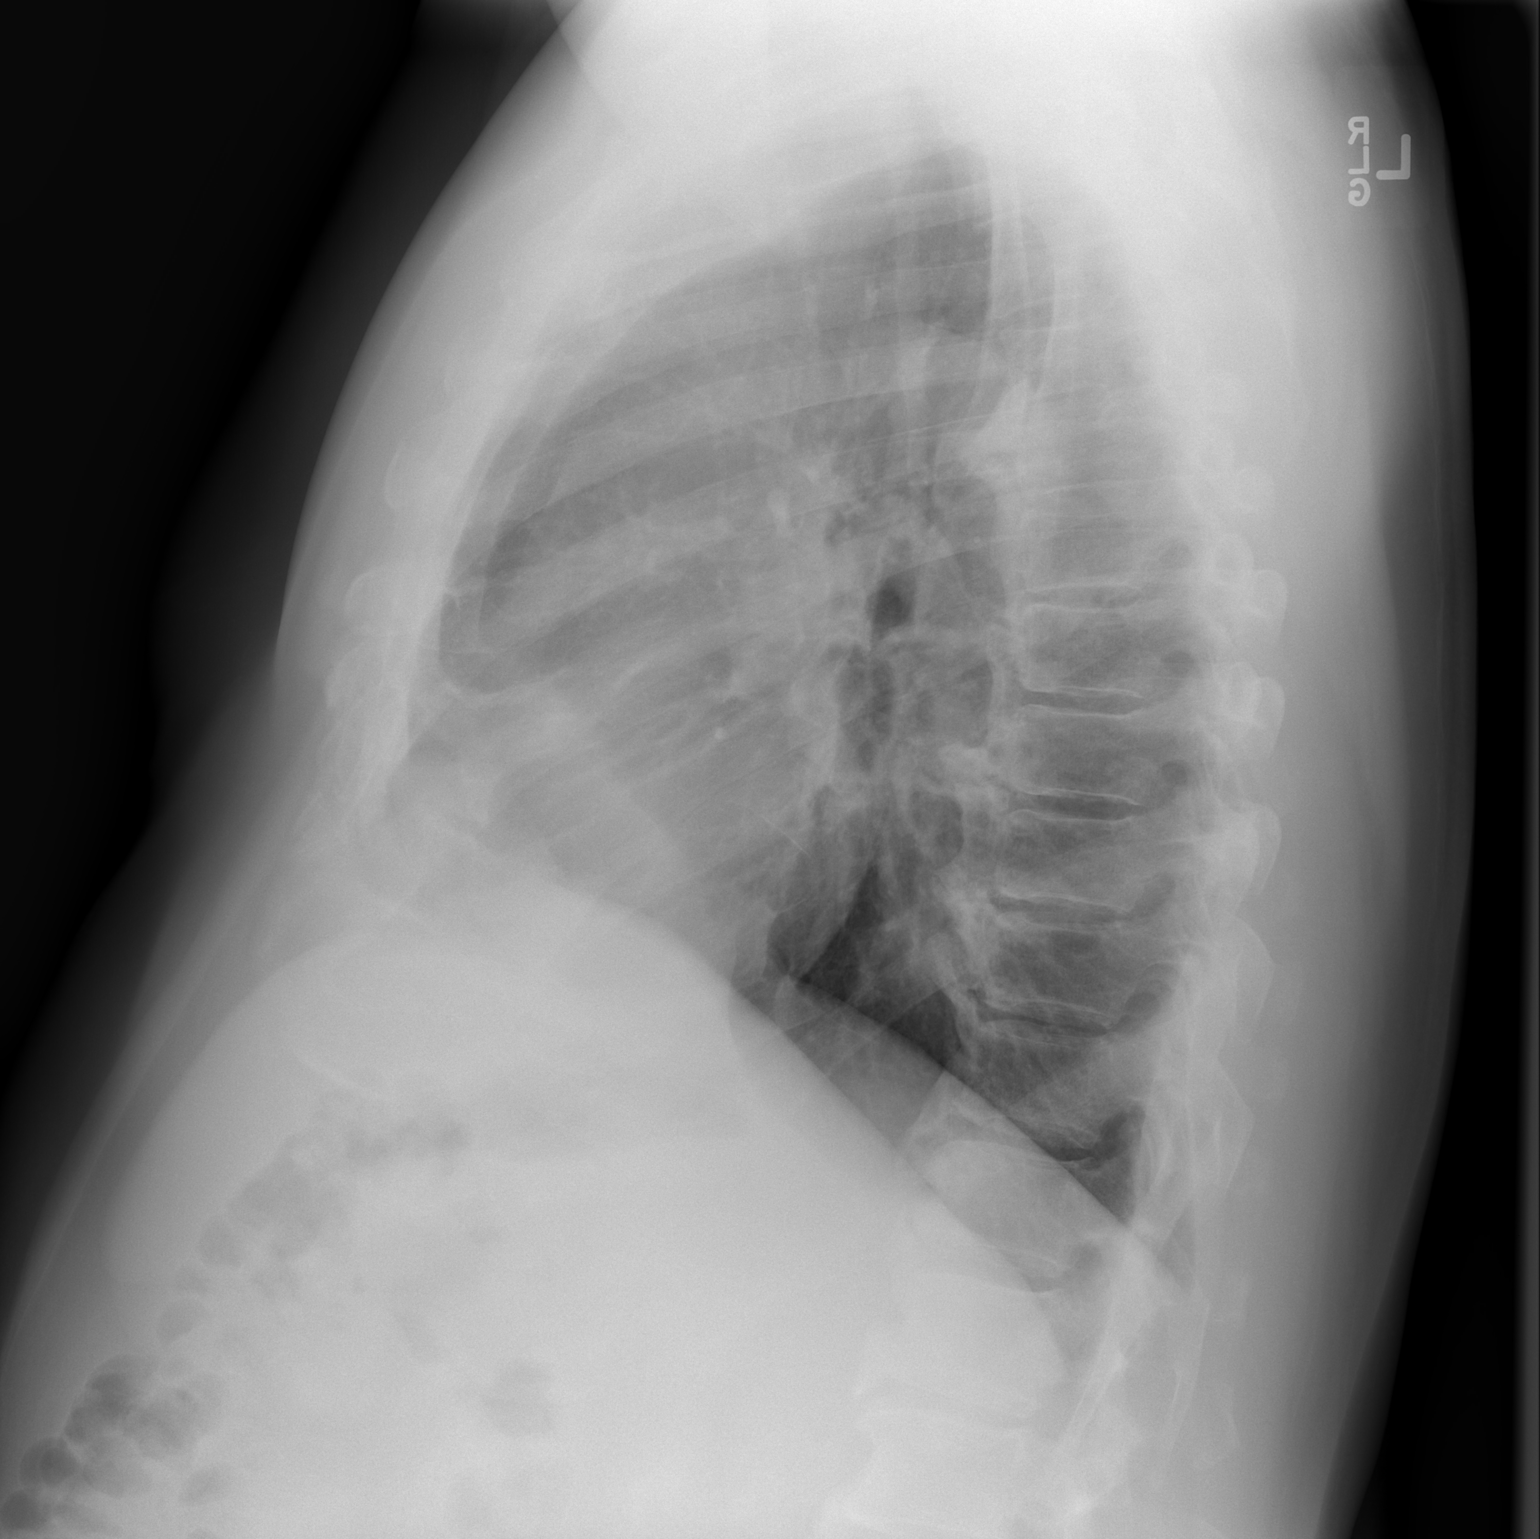

[2 of 2 positions shown; findings below may reference images not displayed]

FINDINGS: Heart size is upper normal.  Density in the anterior
cardiophrenic region bilaterally is suggestive of prominent
pericardiac fat pads.  The lung fields are clear with no signs of
focal infiltrate or congestive failure.  No pleural fluid or
significant peribronchial cuffing is seen.

Prominent degenerative osteophytosis along the thoracic spine and
upper lumbar spine is noted.
IMPRESSION: Findings compatible with prominent pericardiac fat pads.  No
worrisome focal or acute abnormality identified.

## 2013-04-11 ENCOUNTER — Ambulatory Visit (INDEPENDENT_AMBULATORY_CARE_PROVIDER_SITE_OTHER): Payer: 59 | Admitting: Internal Medicine

## 2013-04-11 ENCOUNTER — Encounter: Payer: Self-pay | Admitting: Internal Medicine

## 2013-04-11 VITALS — BP 120/76 | HR 60 | Temp 98.3°F | Wt 239.0 lb

## 2013-04-11 DIAGNOSIS — R739 Hyperglycemia, unspecified: Secondary | ICD-10-CM

## 2013-04-11 DIAGNOSIS — E291 Testicular hypofunction: Secondary | ICD-10-CM

## 2013-04-11 DIAGNOSIS — E785 Hyperlipidemia, unspecified: Secondary | ICD-10-CM

## 2013-04-11 DIAGNOSIS — Z23 Encounter for immunization: Secondary | ICD-10-CM

## 2013-04-11 DIAGNOSIS — R7309 Other abnormal glucose: Secondary | ICD-10-CM

## 2013-04-11 DIAGNOSIS — I10 Essential (primary) hypertension: Secondary | ICD-10-CM

## 2013-04-11 LAB — LIPID PANEL
Cholesterol: 165 mg/dL (ref 0–200)
HDL: 37.4 mg/dL — AB (ref >39.00)
LDL Cholesterol: 92 mg/dL (ref 0–99)
TRIGLYCERIDES: 177 mg/dL — AB (ref 0.0–149.0)
Total CHOL/HDL Ratio: 4
VLDL: 35.4 mg/dL (ref 0.0–40.0)

## 2013-04-11 LAB — BASIC METABOLIC PANEL
BUN: 22 mg/dL (ref 6–23)
CALCIUM: 9.4 mg/dL (ref 8.4–10.5)
CO2: 26 mEq/L (ref 19–32)
Chloride: 101 mEq/L (ref 96–112)
Creatinine, Ser: 1.2 mg/dL (ref 0.4–1.5)
GFR: 64.55 mL/min (ref >60.00)
Glucose, Bld: 134 mg/dL — ABNORMAL HIGH (ref 70–99)
Potassium: 4 mEq/L (ref 3.5–5.1)
SODIUM: 137 meq/L (ref 135–145)

## 2013-04-11 LAB — HEMOGLOBIN A1C: HEMOGLOBIN A1C: 6.5 % (ref 4.6–6.5)

## 2013-04-11 NOTE — Patient Instructions (Signed)
Get your blood work before you leave   Next visit is for a physical exam in 6 months , no fasting Please make an appointment     If you need more information about a healthy diet, elevated blood sugar  visit  the American [employer] Association, it  is a great resource online at:  http://www.richard-flynn.net/  All about diabetes, great resource!  InsuranceTransaction.co.za.html

## 2013-04-11 NOTE — Assessment & Plan Note (Addendum)
Good compliance with medications without apparent side effects, check FLP

## 2013-04-11 NOTE — Progress Notes (Signed)
Subjective:    Patient ID: Jimmy Valentine, male    DOB: [birth year], 63 y.o.   MRN: [id number]  DOS:  04/11/2013 Type of  visit: ROV Hypertension, good medication compliance, ambulatory BPs around 125/80. Preiabetes---no ambulatory CBGs, life style is improving, see review of systems High cholesterol--good compliance with medications without apparent side effects   ROS Diet-- improving  diet  Exercise-- increase walking  No  CP, SOB Denies  nausea, vomiting diarrhea Denies  blood in the stools No major issues w/ aches -pains, occ back pain     Past Medical History  Diagnosis Date  . Hypertension 1980s  . Hyperlipidemia 1990s  . 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 Erectile dysfunction   . Sleep apnea     does't use cpap  . Elevated LFTs     Past Surgical History  Procedure Laterality Date  . Total knee arthroplasty Right 2005  . Total knee arthroplasty  12/04/2011    Procedure: TOTAL KNEE ARTHROPLASTY;  Surgeon: Sydnee Cabal, MD;  Location: [facility];  Service: Orthopedics;  Laterality: Left;    History   Social History  . Marital Status: Married    Spouse Name: married    Number of Children: 0  . Years of Education: N/A   Occupational History  . sales rep    Social History Main Topics  . Smoking status: Former Smoker -- 1.00 packs/day for 15 years    Types: Cigarettes    Quit date: 12/04/1995  . Smokeless tobacco: Never Used  . Alcohol Use: 6.0 oz/week    12 drink(s) per week     Comment: social drinker  . Drug Use: No  . Sexual Activity: Not Currently   Other Topics Concern  . Not on file   Social History Narrative   Married, 2 step kids               Medication List       This list is accurate as of: 04/11/13  7:03 PM.  Always use your most recent med list.               amLODipine 10 MG tablet  Commonly known as:  NORVASC  TAKE 1 TABLET BY MOUTH DAILY     colchicine  0.6 MG tablet  Take 1 tablet (0.6 mg total) by mouth 2 (two) times daily as needed.     sildenafil 100 MG tablet  Commonly known as:  VIAGRA  Take 1 tablet (100 mg total) by mouth daily as needed.     simvastatin 40 MG tablet  Commonly known as:  ZOCOR  TAKE 1 TABLET BY MOUTH AT BEDTIME     valsartan-hydrochlorothiazide 320-25 MG per tablet  Commonly known as:  DIOVAN-HCT  TAKE 1 TABLET BY MOUTH ONCE DAILY           Objective:   Physical Exam BP 120/76  Pulse 60  Temp(Src) 98.3 F (36.8 C)  Wt 239 lb (108.41 kg)  SpO2 96%  General -- alert, well-developed, NAD.  Lungs -- normal respiratory effort, no intercostal retractions, no accessory muscle use, and normal breath sounds.  Heart-- normal rate, regular rhythm, no murmur.  Abdomen-- Not distended, good bowel sounds,soft, non-tender. Extremities-- no pretibial edema bilaterally  Neurologic--  alert & oriented X3. Speech normal, gait normal, strength normal in all extremities.  Psych-- Cognition and judgment appear intact. Cooperative with normal attention span and concentration. No  anxious or depressed appearing.     Assessment & Plan:

## 2013-04-11 NOTE — Assessment & Plan Note (Signed)
Well-controlled, on high doses of ARBs, check a BMP

## 2013-04-11 NOTE — Assessment & Plan Note (Addendum)
Pre diabetes, check a A1c, concept of hemoglobin A1c discussed

## 2013-04-11 NOTE — Assessment & Plan Note (Signed)
Bone density test normal 11-2012

## 2013-04-11 NOTE — Progress Notes (Signed)
Pre visit review using our clinic review tool, if applicable. No additional management support is needed unless otherwise documented below in the visit note. 

## 2013-04-12 ENCOUNTER — Telehealth: Payer: Self-pay | Admitting: Internal Medicine

## 2013-04-12 NOTE — Telephone Encounter (Signed)
Relevant patient education mailed to patient.  

## 2013-04-13 ENCOUNTER — Encounter: Payer: Self-pay | Admitting: *Deleted

## 2013-07-17 ENCOUNTER — Other Ambulatory Visit: Payer: Self-pay | Admitting: Internal Medicine

## 2013-07-17 DIAGNOSIS — E785 Hyperlipidemia, unspecified: Secondary | ICD-10-CM

## 2013-07-17 NOTE — Telephone Encounter (Signed)
Refill for zocor sent to [facility] on [address]

## 2013-07-17 NOTE — Telephone Encounter (Signed)
Refill for zocor sent to [facility]

## 2013-08-03 ENCOUNTER — Other Ambulatory Visit: Payer: Self-pay | Admitting: *Deleted

## 2013-08-03 DIAGNOSIS — E785 Hyperlipidemia, unspecified: Secondary | ICD-10-CM

## 2013-08-03 MED ORDER — AMLODIPINE BESYLATE 10 MG PO TABS: ORAL_TABLET | ORAL | Status: DC

## 2013-08-03 MED ORDER — VALSARTAN-HYDROCHLOROTHIAZIDE 320-25 MG PO TABS: ORAL_TABLET | ORAL | Status: DC

## 2013-08-03 MED ORDER — SIMVASTATIN 40 MG PO TABS: ORAL_TABLET | ORAL | Status: DC

## 2013-08-18 ENCOUNTER — Ambulatory Visit (INDEPENDENT_AMBULATORY_CARE_PROVIDER_SITE_OTHER): Payer: 59 | Admitting: Family Medicine

## 2013-08-18 ENCOUNTER — Encounter: Payer: Self-pay | Admitting: Family Medicine

## 2013-08-18 VITALS — BP 119/80 | HR 67 | Temp 98.5°F | Resp 14 | Ht 69.0 in | Wt 238.0 lb

## 2013-08-18 DIAGNOSIS — M109 Gout, unspecified: Secondary | ICD-10-CM

## 2013-08-18 DIAGNOSIS — J0120 Acute ethmoidal sinusitis, unspecified: Secondary | ICD-10-CM

## 2013-08-18 DIAGNOSIS — J029 Acute pharyngitis, unspecified: Secondary | ICD-10-CM

## 2013-08-18 MED ORDER — AMOXICILLIN-POT CLAVULANATE 875-125 MG PO TABS: 1.0000 | ORAL_TABLET | Freq: Two times a day (BID) | ORAL | Status: DC

## 2013-08-18 NOTE — Progress Notes (Signed)
Chief Complaint:  Chief Complaint  Patient presents with  . Sore Throat  . Wrist Pain     ? Gout pain  . Cough    HPI: Jimmy Valentine is a 63 y.o. male who is here for sore throat with cough mostly  at night and in the AM, productive yellow sputum for the last 3 weeks. He has tried chlorasetin and also mucinex without releif. No fevers or chills.  No facial pain, no ear pain. Thsi started after he came back from [place] for his anniversary, no one else was sick . No asthma, allergies. HAs been on valsartan without cough SEs in the past.   He also has ahd wrist pain feels similar to gout on his ankle which he took colcrys for and that seemed to relieve it.   Past Medical History  Diagnosis Date  . Hypertension 1980s  . Hyperlipidemia 1990s  . 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 Erectile dysfunction   . Sleep apnea     does't use cpap  . Elevated LFTs    Past Surgical History  Procedure Laterality Date  . Total knee arthroplasty Right 2005  . Total knee arthroplasty  12/04/2011    Procedure: TOTAL KNEE ARTHROPLASTY;  Surgeon: Sydnee Cabal, MD;  Location: [facility];  Service: Orthopedics;  Laterality: Left;  . Joint replacement      Both Knees   History   Social History  . Marital Status: Married    Spouse Name: married    Number of Children: 0  . Years of Education: N/A   Occupational History  . sales rep    Social History Main Topics  . Smoking status: Former Smoker -- 1.00 packs/day for 15 years    Types: Cigarettes    Quit date: 12/04/1995  . Smokeless tobacco: Never Used  . Alcohol Use: 6.0 oz/week    12 drink(s) per week     Comment: social drinker  . Drug Use: No  . Sexual Activity: Not Currently   Other Topics Concern  . None   Social History Narrative   Married, 2 step kids          Family History  Problem Relation Age of Onset  . Hypertension Mother   . Coronary artery  disease Mother     M CABG at age 37s  . Diabetes Sister   . Colon cancer Neg Hx   . Prostate cancer Neg Hx    No Known Allergies Prior to Admission medications   Medication Sig Start Date End Date Taking? Authorizing Provider  amLODipine (NORVASC) 10 MG tablet TAKE 1 TABLET BY MOUTH DAILY 08/03/13  Yes Colon Branch, MD  colchicine 0.6 MG tablet Take 1 tablet (0.6 mg total) by mouth 2 (two) times daily as needed. 10/12/12  Yes Colon Branch, MD  sildenafil (VIAGRA) 100 MG tablet Take 1 tablet (100 mg total) by mouth daily as needed. 04/04/12  Yes Colon Branch, MD  simvastatin (ZOCOR) 40 MG tablet TAKE 1 TABLET BY MOUTH EVERY NIGHT AT BEDTIME 08/03/13  Yes Colon Branch, MD  valsartan-hydrochlorothiazide (DIOVAN-HCT) 320-25 MG per tablet TAKE 1 TABLET BY MOUTH ONCE DAILY 08/03/13  Yes Colon Branch, MD     ROS: The patient denies fevers, chills, night sweats, unintentional weight loss, chest pain, palpitations, wheezing, dyspnea on exertion, nausea, vomiting, abdominal pain, dysuria, hematuria, melena, numbness, weakness, or tingling.  All other systems have been reviewed and were otherwise negative with the exception of those mentioned in the HPI and as above.    PHYSICAL EXAM: Filed Vitals:   08/18/13 1210  BP: 119/80  Pulse: 67  Temp: 98.5 F (36.9 C)  Resp: 14   Filed Vitals:   08/18/13 1210  Height: 5\' 9"  (1.753 m)  Weight: 238 lb (107.956 kg)   Body mass index is 35.13 kg/(m^2).  General: Alert, no acute distress HEENT:  Normocephalic, atraumatic, oropharynx patent. EOMI, PERRLA, erythemaotus throat, no exudates, TM normal, minimally to nontender sinuses Cardiovascular:  Regular rate and rhythm, no rubs murmurs or gallops.  No Carotid bruits, radial pulse intact. No pedal edema.  Respiratory: Clear to auscultation bilaterally.  No wheezes, rales, or rhonchi.  No cyanosis, no use of accessory musculature GI: No organomegaly, abdomen is soft and non-tender, positive bowel sounds.  No  masses. Skin: No rashes. Neurologic: Facial musculature symmetric. Psychiatric: Patient is appropriate throughout our interaction. Lymphatic: No cervical lymphadenopathy Musculoskeletal: Gait intact.   LABS: Results for orders placed in visit on 14/78/29  BASIC METABOLIC PANEL      Result Value Ref Range   Sodium 137  135 - 145 mEq/L   Potassium 4.0  3.5 - 5.1 mEq/L   Chloride 101  96 - 112 mEq/L   CO2 26  19 - 32 mEq/L   Glucose, Bld 134 (*) 70 - 99 mg/dL   BUN 22  6 - 23 mg/dL   Creatinine, Ser 1.2  0.4 - 1.5 mg/dL   Calcium 9.4  8.4 - 10.5 mg/dL   GFR 64.55  >60.00 mL/min  LIPID PANEL      Result Value Ref Range   Cholesterol 165  0 - 200 mg/dL   Triglycerides 177.0 (*) 0.0 - 149.0 mg/dL   HDL 37.40 (*) >39.00 mg/dL   VLDL 35.4  0.0 - 40.0 mg/dL   LDL Cholesterol 92  0 - 99 mg/dL   Total CHOL/HDL Ratio 4    HEMOGLOBIN A1C      Result Value Ref Range   Hemoglobin A1C 6.5  4.6 - 6.5 %     EKG/XRAY:   Primary read interpreted by Dr. Marin Comment at [facility].   ASSESSMENT/PLAN: Encounter Diagnoses  Name Primary?  . Acute ethmoidal sinusitis, recurrence not specified Yes  . Acute gout of right wrist, unspecified cause   . Acute pharyngitis, unspecified pharyngitis type    Rx Augmentin OTc cough meds SAlt water gargles C/w colcrys prn F.u prn  Gross sideeffects, risk and benefits, and alternatives of medications d/w patient. Patient is aware that all medications have potential sideeffects and we are unable to predict every sideeffect or drug-drug interaction that may occur.  Jimmy Valentine, Stanton, DO 08/21/2013 7:25 AM

## 2013-10-13 ENCOUNTER — Telehealth: Payer: Self-pay | Admitting: Internal Medicine

## 2013-10-13 ENCOUNTER — Ambulatory Visit (INDEPENDENT_AMBULATORY_CARE_PROVIDER_SITE_OTHER): Payer: 59 | Admitting: Internal Medicine

## 2013-10-13 ENCOUNTER — Encounter: Payer: Self-pay | Admitting: Internal Medicine

## 2013-10-13 VITALS — BP 124/68 | HR 63 | Temp 97.9°F | Ht 69.0 in | Wt 243.0 lb

## 2013-10-13 DIAGNOSIS — R739 Hyperglycemia, unspecified: Secondary | ICD-10-CM

## 2013-10-13 DIAGNOSIS — Z0000 Encounter for general adult medical examination without abnormal findings: Secondary | ICD-10-CM

## 2013-10-13 DIAGNOSIS — Z23 Encounter for immunization: Secondary | ICD-10-CM

## 2013-10-13 DIAGNOSIS — R7309 Other abnormal glucose: Secondary | ICD-10-CM

## 2013-10-13 LAB — PSA: PSA: 1.52 ng/mL (ref 0.10–4.00)

## 2013-10-13 LAB — LIPID PANEL
Cholesterol: 168 mg/dL (ref 0–200)
HDL: 32.2 mg/dL — ABNORMAL LOW (ref >39.00)
LDL Cholesterol: 105 mg/dL — ABNORMAL HIGH (ref 0–99)
NonHDL: 135.8
Total CHOL/HDL Ratio: 5
Triglycerides: 153 mg/dL — ABNORMAL HIGH (ref 0.0–149.0)
VLDL: 30.6 mg/dL (ref 0.0–40.0)

## 2013-10-13 LAB — COMPREHENSIVE METABOLIC PANEL
ALT: 56 U/L — ABNORMAL HIGH (ref 0–53)
AST: 40 U/L — ABNORMAL HIGH (ref 0–37)
Albumin: 4.1 g/dL (ref 3.5–5.2)
Alkaline Phosphatase: 91 U/L (ref 39–117)
BUN: 21 mg/dL (ref 6–23)
CALCIUM: 9.4 mg/dL (ref 8.4–10.5)
CHLORIDE: 104 meq/L (ref 96–112)
CO2: 27 meq/L (ref 19–32)
CREATININE: 1.1 mg/dL (ref 0.4–1.5)
GFR: 70.52 mL/min (ref >60.00)
Glucose, Bld: 145 mg/dL — ABNORMAL HIGH (ref 70–99)
Potassium: 4.4 mEq/L (ref 3.5–5.1)
Sodium: 139 mEq/L (ref 135–145)
Total Bilirubin: 0.8 mg/dL (ref 0.2–1.2)
Total Protein: 7.5 g/dL (ref 6.0–8.3)

## 2013-10-13 LAB — HEMOGLOBIN A1C: HEMOGLOBIN A1C: 6.4 % (ref 4.6–6.5)

## 2013-10-13 MED ORDER — SILDENAFIL CITRATE 100 MG PO TABS: 100.0000 mg | ORAL_TABLET | Freq: Every day | ORAL | Status: DC | PRN

## 2013-10-13 NOTE — Telephone Encounter (Signed)
Will mail results to Pt as soon as they come in.

## 2013-10-13 NOTE — Assessment & Plan Note (Signed)
asx

## 2013-10-13 NOTE — Progress Notes (Signed)
Subjective:    Patient ID: Jimmy Valentine, male    DOB: 1950-09-16, 63 y.o.   MRN: [id number]  DOS:  10/13/2013 Type of visit - description : CPX Interval history: In general feeling very well, easy bruising in the arms is his only concern  ROS No  CP, SOB  Denies  nausea, vomiting ; had diarrhea after traveling, now asx , no blood in the stools (-) cough, sputum production (-) wheezing, chest congestion No dysuria, gross hematuria, difficulty urinating  No anxiety, depression   Past Medical History  Diagnosis Date  . Hypertension 1980s  . Hyperlipidemia 1990s  . 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 Erectile dysfunction   . Sleep apnea     does't use cpap  . Elevated LFTs     Past Surgical History  Procedure Laterality Date  . Total knee arthroplasty Right 2005  . Total knee arthroplasty  12/04/2011    Procedure: TOTAL KNEE ARTHROPLASTY;  Surgeon: Sydnee Cabal, MD;  Location: [facility];  Service: Orthopedics;  Laterality: Left;    History   Social History  . Marital Status: Married    Spouse Name: married    Number of Children: 0  . Years of Education: N/A   Occupational History  . sales rep    Social History Main Topics  . Smoking status: Former Smoker -- 1.00 packs/day for 15 years    Types: Cigarettes    Quit date: 12/04/1995  . Smokeless tobacco: Never Used  . Alcohol Use: 6.0 oz/week    12 drink(s) per week     Comment: social drinker  . Drug Use: No  . Sexual Activity: Not Currently   Other Topics Concern  . Not on file   Social History Narrative   Married, 2 step kids            Family History  Problem Relation Age of Onset  . Hypertension Mother   . Coronary artery disease Mother     M CABG at age 69s  . Diabetes Sister   . Colon cancer Neg Hx   . Prostate cancer Neg Hx        Medication List       This list is accurate as of: 10/13/13 11:59 PM.  Always use your  most recent med list.               amLODipine 10 MG tablet  Commonly known as:  NORVASC  TAKE 1 TABLET BY MOUTH DAILY     colchicine 0.6 MG tablet  Take 1 tablet (0.6 mg total) by mouth 2 (two) times daily as needed.     sildenafil 100 MG tablet  Commonly known as:  VIAGRA  Take 1 tablet (100 mg total) by mouth daily as needed.     simvastatin 40 MG tablet  Commonly known as:  ZOCOR  TAKE 1 TABLET BY MOUTH EVERY NIGHT AT BEDTIME     valsartan-hydrochlorothiazide 320-25 MG per tablet  Commonly known as:  DIOVAN-HCT  TAKE 1 TABLET BY MOUTH ONCE DAILY           Objective:   Physical Exam BP 124/68  Pulse 63  Temp(Src) 97.9 F (36.6 C) (Oral)  Ht 5\' 9"  (1.753 m)  Wt 243 lb (110.224 kg)  BMI 35.87 kg/m2  SpO2 95% General -- alert, well-developed, NAD.  Neck --no thyromegaly , normal carotid pulse  HEENT-- Not pale. Lungs --  normal respiratory effort, no intercostal retractions, no accessory muscle use, and normal breath sounds.  Heart-- normal rate, regular rhythm, no murmur.  Abdomen-- Not distended, good bowel sounds,soft, non-tender. No bruit . Skin-- cap fragility noted in the arms  Rectal--  External skin tag noted. Normal sphincter tone. No rectal masses or tenderness. Stool brown  Prostate--Prostate gland firm and smooth, slt  Enlargement; no nodularity, tenderness, mass, asymmetry or induration. Extremities-- no pretibial edema bilaterally  Neurologic--  alert & oriented X3. Speech normal, gait appropriate for age, strength symmetric and appropriate for age.  Psych-- Cognition and judgment appear intact. Cooperative with normal attention span and concentration. No anxious or depressed appearing.      Assessment & Plan:

## 2013-10-13 NOTE — Progress Notes (Signed)
Pre visit review using our clinic review tool, if applicable. No additional management support is needed unless otherwise documented below in the visit note. 

## 2013-10-13 NOTE — Patient Instructions (Signed)
Get your blood work before you leave   Please come back to the office in 6 months  for a routine office visit , no fasting    Stop by the front desk and schedule the visit      See the eye doctor yearly

## 2013-10-13 NOTE — Telephone Encounter (Signed)
Patient is requesting that todays lab results be mailed to him.

## 2013-10-13 NOTE — Assessment & Plan Note (Addendum)
TD 2014 Flu shot  -- today 2011 shingles shot   2011 pneumonia shot  prevnar today Cscope at age 63, normal, Dr Cher Nakai, next 10 years 3 colon cancer screening modalities discussed, we agree he will take an iFOB kit but  call if interested on a colonoscopy/cologuard  in the future Labs Diet-exercise discussed

## 2013-10-16 NOTE — Telephone Encounter (Signed)
Pt informed and results mailed to Pt.

## 2013-10-16 NOTE — Telephone Encounter (Signed)
Please see phone note  

## 2013-10-16 NOTE — Telephone Encounter (Signed)
Advise patient, blood sugar is a stable A1c of 6.4 LFTs slightly elevated, we'll recheck on return to the office. LDL or bad cholesterol is very close to his goal of 100 or less. Plan: Continue with same medications, watch diet, stay active. Send the patient a hard copy of his labs

## 2013-11-21 ENCOUNTER — Other Ambulatory Visit (INDEPENDENT_AMBULATORY_CARE_PROVIDER_SITE_OTHER): Payer: 59

## 2013-11-21 DIAGNOSIS — Z0000 Encounter for general adult medical examination without abnormal findings: Secondary | ICD-10-CM

## 2013-11-21 LAB — FECAL OCCULT BLOOD, IMMUNOCHEMICAL: Fecal Occult Bld: NEGATIVE

## 2014-02-20 ENCOUNTER — Telehealth: Payer: Self-pay | Admitting: Internal Medicine

## 2014-02-20 ENCOUNTER — Other Ambulatory Visit: Payer: Self-pay

## 2014-02-20 DIAGNOSIS — I10 Essential (primary) hypertension: Secondary | ICD-10-CM

## 2014-02-20 MED ORDER — SIMVASTATIN 40 MG PO TABS: ORAL_TABLET | ORAL | Status: DC

## 2014-02-20 MED ORDER — VALSARTAN-HYDROCHLOROTHIAZIDE 320-25 MG PO TABS: ORAL_TABLET | ORAL | Status: DC

## 2014-02-20 MED ORDER — AMLODIPINE BESYLATE 10 MG PO TABS: ORAL_TABLET | ORAL | Status: DC

## 2014-02-20 NOTE — Telephone Encounter (Signed)
Refills sent to [facility] as requested.  

## 2014-02-20 NOTE — Telephone Encounter (Signed)
Has changed insurance and needs his amlodipine 10 mg simvastatin 40 mg diovan hct 320 25 to [facility] on [address]

## 2014-03-05 ENCOUNTER — Other Ambulatory Visit: Payer: Self-pay | Admitting: Internal Medicine

## 2014-04-13 ENCOUNTER — Encounter: Payer: Self-pay | Admitting: Internal Medicine

## 2014-04-13 ENCOUNTER — Ambulatory Visit (INDEPENDENT_AMBULATORY_CARE_PROVIDER_SITE_OTHER): Payer: BLUE CROSS/BLUE SHIELD | Admitting: Internal Medicine

## 2014-04-13 VITALS — BP 122/64 | HR 60 | Temp 98.1°F | Ht 69.0 in | Wt 243.0 lb

## 2014-04-13 DIAGNOSIS — R739 Hyperglycemia, unspecified: Secondary | ICD-10-CM

## 2014-04-13 DIAGNOSIS — Z0000 Encounter for general adult medical examination without abnormal findings: Secondary | ICD-10-CM

## 2014-04-13 DIAGNOSIS — R945 Abnormal results of liver function studies: Secondary | ICD-10-CM

## 2014-04-13 DIAGNOSIS — R7303 Prediabetes: Secondary | ICD-10-CM

## 2014-04-13 DIAGNOSIS — I10 Essential (primary) hypertension: Secondary | ICD-10-CM

## 2014-04-13 DIAGNOSIS — R7309 Other abnormal glucose: Secondary | ICD-10-CM

## 2014-04-13 DIAGNOSIS — R7989 Other specified abnormal findings of blood chemistry: Secondary | ICD-10-CM | POA: Insufficient documentation

## 2014-04-13 LAB — BASIC METABOLIC PANEL
BUN: 20 mg/dL (ref 6–23)
CO2: 29 mEq/L (ref 19–32)
Calcium: 9.9 mg/dL (ref 8.4–10.5)
Chloride: 101 mEq/L (ref 96–112)
Creatinine, Ser: 1.15 mg/dL (ref 0.40–1.50)
GFR: 67.59 mL/min (ref >60.00)
Glucose, Bld: 154 mg/dL — ABNORMAL HIGH (ref 70–99)
POTASSIUM: 4.1 meq/L (ref 3.5–5.1)
Sodium: 135 mEq/L (ref 135–145)

## 2014-04-13 LAB — ALT: ALT: 63 U/L — AB (ref 0–53)

## 2014-04-13 LAB — HEMOGLOBIN A1C: Hgb A1c MFr Bld: 6.7 % — ABNORMAL HIGH (ref 4.6–6.5)

## 2014-04-13 LAB — AST: AST: 36 U/L (ref 0–37)

## 2014-04-13 NOTE — Assessment & Plan Note (Signed)
Ready for a colonoscopy, will refer to our GI per patient request

## 2014-04-13 NOTE — Patient Instructions (Signed)
Get your blood work before you leave  Please watch the video The Skinny on obesity (you tube) Will refer you for a colonoscopy  Come back to the office in 6 months  for a physical exam  Please schedule an appointment at the front desk    Come back fasting

## 2014-04-13 NOTE — Progress Notes (Signed)
Pre visit review using our clinic review tool, if applicable. No additional management support is needed unless otherwise documented below in the visit note. 

## 2014-04-13 NOTE — Assessment & Plan Note (Signed)
Last LFTs a slightly elevated, reports that his previous PCP noted that years ago, some blood work was done, does not recall the results. Does not take Tylenol, drinks only socially, on statins Plan: Check LFTs

## 2014-04-13 NOTE — Assessment & Plan Note (Signed)
Check A1c, diet and exercise discussed. See instructions

## 2014-04-13 NOTE — Assessment & Plan Note (Signed)
Good compliance of medication, check a BMP

## 2014-04-13 NOTE — Progress Notes (Signed)
Subjective:    Patient ID: Jimmy Valentine, male    DOB: 08-12-1950, 64 y.o.   MRN: [id number]  DOS:  04/13/2014 Type of visit - description : rov Interval history: In general feeling well. Good compliance w/ medication Ambulatory BPs 135/85 Had an episode of gout 2 weeks ago, responded to colchicine, not related to any dietary indiscretion. Ready for colonoscopy Last LFTs are slightly elevated, issue d/w patient.   Review of Systems Denies chest pain or difficulty breathing No nausea, vomiting, diarrhea  Past Medical History  Diagnosis Date  . Hypertension 1980s  . Hyperlipidemia 1990s  . 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 Erectile dysfunction   . Sleep apnea     does't use cpap  . Elevated LFTs     Past Surgical History  Procedure Laterality Date  . Total knee arthroplasty Right 2005  . Total knee arthroplasty  12/04/2011    Procedure: TOTAL KNEE ARTHROPLASTY;  Surgeon: Sydnee Cabal, MD;  Location: [facility];  Service: Orthopedics;  Laterality: Left;    History   Social History  . Marital Status: Married    Spouse Name: married  . Number of Children: 0  . Years of Education: N/A   Occupational History  . sales rep    Social History Main Topics  . Smoking status: Former Smoker -- 1.00 packs/day for 15 years    Types: Cigarettes    Quit date: 12/04/1995  . Smokeless tobacco: Never Used  . Alcohol Use: 6.0 oz/week    12 drink(s) per week     Comment: social drinker  . Drug Use: No  . Sexual Activity: Not Currently   Other Topics Concern  . Not on file   Social History Narrative   Married, 2 step kids               Medication List       This list is accurate as of: 04/13/14 11:59 PM.  Always use your most recent med list.               amLODipine 10 MG tablet  Commonly known as:  NORVASC  TAKE 1 TABLET BY MOUTH DAILY     colchicine 0.6 MG tablet  TAKE 1 TABLET BY MOUTH  TWICE DAILY AS NEEDED     sildenafil 100 MG tablet  Commonly known as:  VIAGRA  Take 1 tablet (100 mg total) by mouth daily as needed.     simvastatin 40 MG tablet  Commonly known as:  ZOCOR  TAKE 1 TABLET BY MOUTH EVERY NIGHT AT BEDTIME     valsartan-hydrochlorothiazide 320-25 MG per tablet  Commonly known as:  DIOVAN-HCT  TAKE 1 TABLET BY MOUTH ONCE DAILY           Objective:   Physical Exam BP 122/64 mmHg  Pulse 60  Temp(Src) 98.1 F (36.7 C) (Oral)  Ht 5\' 9"  (1.753 m)  Wt 243 lb (110.224 kg)  BMI 35.87 kg/m2  SpO2 97% General:   Well developed, well nourished . NAD.  HEENT:  Normocephalic . Face symmetric, atraumatic Lungs:  CTA B Normal respiratory effort, no intercostal retractions, no accessory muscle use. Heart: RRR,  no murmur.  Muscle skeletal: no pretibial edema bilaterally  Skin: Not pale. Not jaundice Neurologic:  alert & oriented X3.  Speech normal, gait appropriate for age and unassisted Psych--  Cognition and judgment appear intact.  Cooperative with normal attention span  and concentration.  Behavior appropriate. No anxious or depressed appearing.        Assessment & Plan:

## 2014-04-17 ENCOUNTER — Encounter: Payer: Self-pay | Admitting: Internal Medicine

## 2014-06-08 ENCOUNTER — Encounter: Payer: Self-pay | Admitting: Internal Medicine

## 2014-06-08 ENCOUNTER — Ambulatory Visit (AMBULATORY_SURGERY_CENTER): Payer: Self-pay | Admitting: *Deleted

## 2014-06-08 VITALS — Ht 69.0 in | Wt 244.6 lb

## 2014-06-08 DIAGNOSIS — Z1211 Encounter for screening for malignant neoplasm of colon: Secondary | ICD-10-CM

## 2014-06-08 NOTE — Progress Notes (Signed)
No egg or soy allergy No diet pills No home 02 No issues with past sedation. With last knee replacement was told had sleep apnea because snoring

## 2014-06-22 ENCOUNTER — Ambulatory Visit (AMBULATORY_SURGERY_CENTER): Payer: BLUE CROSS/BLUE SHIELD | Admitting: Internal Medicine

## 2014-06-22 ENCOUNTER — Encounter: Payer: Self-pay | Admitting: Internal Medicine

## 2014-06-22 VITALS — BP 126/80 | HR 52 | Temp 97.0°F | Resp 19 | Ht 69.0 in | Wt 244.0 lb

## 2014-06-22 DIAGNOSIS — Z1211 Encounter for screening for malignant neoplasm of colon: Secondary | ICD-10-CM | POA: Diagnosis not present

## 2014-06-22 MED ORDER — SODIUM CHLORIDE 0.9 % IV SOLN: 500.0000 mL | INTRAVENOUS | Status: DC

## 2014-06-22 NOTE — Op Note (Addendum)
[place]  [address]. [place], [id number]   COLONOSCOPY PROCEDURE REPORT  PATIENT: Jimmy Valentine, Jimmy Valentine  MR#: [id number] BIRTHDATE: 1950-11-27 , 44  yrs. old GENDER: male ENDOSCOPIST: Jerene Bears, MD REFERRED RC:VELF Larose Kells, M.D. PROCEDURE DATE:  06/22/2014 PROCEDURE:   Colonoscopy, screening First Screening Colonoscopy - Avg.  risk and is 50 yrs.  old or older - No.  Prior Negative Screening - Now for repeat screening. 10 or more years since last screening  History of Adenoma - Now for follow-up colonoscopy & has been > or = to 3 yrs.  N/A  Polyps removed today? No Recommend repeat exam, <10 yrs? No ASA CLASS:   Class II INDICATIONS:Screening for colonic neoplasia and Colorectal Neoplasm Risk Assessment for this procedure is average risk. MEDICATIONS: Monitored anesthesia care and Propofol 200 mg IV  DESCRIPTION OF PROCEDURE:   After the risks benefits and alternatives of the procedure were thoroughly explained, informed consent was obtained.  The digital rectal exam revealed no abnormalities of the rectum.   The [id number]  endoscope was introduced through the anus and advanced to the cecum, which was identified by both the appendix and ileocecal valve. No adverse events experienced.   The quality of the prep was good, using MoviPrep  The instrument was then slowly withdrawn as the colon was fully examined. Estimated blood loss is zero unless otherwise noted in this procedure report.      COLON FINDINGS: A normal appearing cecum, ileocecal valve, and appendiceal orifice were identified.  the ascending, transverse, descending, sigmoid colon, and rectum appeared unremarkable. Retroflexed views revealed internal hemorrhoids. The time to cecum = 3.6 Withdrawal time = 11.7   The scope was withdrawn and the procedure completed.  COMPLICATIONS: There were no complications.  ENDOSCOPIC IMPRESSION: Normal colonoscopy  RECOMMENDATIONS: You should  continue to follow colorectal cancer screening guidelines for "routine risk" patients with a repeat colonoscopy in 10 years. There is no need for FOBT (stool) testing for at least 5 years.  eSigned:  Jerene Bears, MD 06/22/2014 8:50 AM   cc: Kathlene November, MD and The Patient

## 2014-06-22 NOTE — Patient Instructions (Signed)
YOU HAD AN ENDOSCOPIC PROCEDURE TODAY AT THE [facility]:   Refer to the procedure report that was given to you for any specific questions about what was found during the examination.  If the procedure report does not answer your questions, please call your gastroenterologist to clarify.  If you requested that your care partner not be given the details of your procedure findings, then the procedure report has been included in a sealed envelope for you to review at your convenience later.  YOU SHOULD EXPECT: Some feelings of bloating in the abdomen. Passage of more gas than usual.  Walking can help get rid of the air that was put into your GI tract during the procedure and reduce the bloating. If you had a lower endoscopy (such as a colonoscopy or flexible sigmoidoscopy) you may notice spotting of blood in your stool or on the toilet paper. If you underwent a bowel prep for your procedure, you may not have a normal bowel movement for a few days.  Please Note:  You might notice some irritation and congestion in your nose or some drainage.  This is from the oxygen used during your procedure.  There is no need for concern and it should clear up in a day or so.  SYMPTOMS TO REPORT IMMEDIATELY:   Following lower endoscopy (colonoscopy or flexible sigmoidoscopy):  Excessive amounts of blood in the stool  Significant tenderness or worsening of abdominal pains  Swelling of the abdomen that is new, acute  Fever of 100F or higher   For urgent or emergent issues, a gastroenterologist can be reached at any hour by calling [phone].   DIET: Your first meal following the procedure should be a small meal and then it is ok to progress to your normal diet. Heavy or fried foods are harder to digest and may make you feel nauseous or bloated.  Likewise, meals heavy in dairy and vegetables can increase bloating.  Drink plenty of fluids but you should avoid alcoholic beverages for 24  hours.  ACTIVITY:  You should plan to take it easy for the rest of today and you should NOT DRIVE or use heavy machinery until tomorrow (because of the sedation medicines used during the test).    FOLLOW UP: Our staff will call the number listed on your records the next business day following your procedure to check on you and address any questions or concerns that you may have regarding the information given to you following your procedure. If we do not reach you, we will leave a message.  However, if you are feeling well and you are not experiencing any problems, there is no need to return our call.  We will assume that you have returned to your regular daily activities without incident.  If any biopsies were taken you will be contacted by phone or by letter within the next 1-3 weeks.  Please call us at [phone] if you have not heard about the biopsies in 3 weeks.    SIGNATURES/CONFIDENTIALITY: You and/or your care partner have signed paperwork which will be entered into your electronic medical record.  These signatures attest to the fact that that the information above on your After Visit Summary has been reviewed and is understood.  Full responsibility of the confidentiality of this discharge information lies with you and/or your care-partner. 

## 2014-06-22 NOTE — Progress Notes (Signed)
Report to PACU, RN, vss, BBS= Clear.  

## 2014-06-25 ENCOUNTER — Telehealth: Payer: Self-pay

## 2014-06-25 NOTE — Telephone Encounter (Signed)
  Follow up Call-  Call back number 06/22/2014  Post procedure Call Back phone  # [phone]  Permission to leave phone message Yes     Patient questions:  Do you have a fever, pain , or abdominal swelling? No. Pain Score  0 *  Have you tolerated food without any problems? Yes.    Have you been able to return to your normal activities? Yes.    Do you have any questions about your discharge instructions: Diet   No. Medications  No. Follow up visit  No.  Do you have questions or concerns about your Care? No.  Actions: * If pain score is 4 or above: No action needed, pain <4.

## 2014-08-17 ENCOUNTER — Other Ambulatory Visit: Payer: Self-pay | Admitting: Internal Medicine

## 2014-08-20 ENCOUNTER — Other Ambulatory Visit: Payer: Self-pay | Admitting: Internal Medicine

## 2014-10-17 ENCOUNTER — Encounter: Payer: Self-pay | Admitting: Behavioral Health

## 2014-10-17 ENCOUNTER — Telehealth: Payer: Self-pay | Admitting: Behavioral Health

## 2014-10-17 NOTE — Addendum Note (Signed)
Addended by: Eduard Roux E on: 10/17/2014 10:06 AM   Modules accepted: Medications

## 2014-10-17 NOTE — Telephone Encounter (Signed)
Pre-Visit Call completed with patient and chart updated.   Pre-Visit Info documented in Specialty Comments under [employer].    

## 2014-10-17 NOTE — Telephone Encounter (Signed)
Unable to reach patient at time of Pre-Visit Call.  Left message for patient to return call when available.    

## 2014-10-18 ENCOUNTER — Ambulatory Visit (INDEPENDENT_AMBULATORY_CARE_PROVIDER_SITE_OTHER): Payer: BLUE CROSS/BLUE SHIELD | Admitting: Internal Medicine

## 2014-10-18 ENCOUNTER — Encounter: Payer: Self-pay | Admitting: Internal Medicine

## 2014-10-18 VITALS — BP 126/76 | HR 56 | Temp 97.7°F | Ht 69.0 in | Wt 241.2 lb

## 2014-10-18 DIAGNOSIS — R945 Abnormal results of liver function studies: Secondary | ICD-10-CM

## 2014-10-18 DIAGNOSIS — Z23 Encounter for immunization: Secondary | ICD-10-CM | POA: Diagnosis not present

## 2014-10-18 DIAGNOSIS — Z1159 Encounter for screening for other viral diseases: Secondary | ICD-10-CM

## 2014-10-18 DIAGNOSIS — R7989 Other specified abnormal findings of blood chemistry: Secondary | ICD-10-CM

## 2014-10-18 DIAGNOSIS — Z0000 Encounter for general adult medical examination without abnormal findings: Secondary | ICD-10-CM | POA: Diagnosis not present

## 2014-10-18 DIAGNOSIS — E119 Type 2 diabetes mellitus without complications: Secondary | ICD-10-CM | POA: Diagnosis not present

## 2014-10-18 DIAGNOSIS — Z09 Encounter for follow-up examination after completed treatment for conditions other than malignant neoplasm: Secondary | ICD-10-CM | POA: Insufficient documentation

## 2014-10-18 LAB — BASIC METABOLIC PANEL
BUN: 22 mg/dL (ref 6–23)
CALCIUM: 9.7 mg/dL (ref 8.4–10.5)
CHLORIDE: 101 meq/L (ref 96–112)
CO2: 25 mEq/L (ref 19–32)
CREATININE: 1.06 mg/dL (ref 0.40–1.50)
GFR: 74.14 mL/min (ref >60.00)
Glucose, Bld: 158 mg/dL — ABNORMAL HIGH (ref 70–99)
Potassium: 4 mEq/L (ref 3.5–5.1)
Sodium: 136 mEq/L (ref 135–145)

## 2014-10-18 LAB — CBC WITH DIFFERENTIAL/PLATELET
BASOS ABS: 0 10*3/uL (ref 0.0–0.1)
Basophils Relative: 0.3 % (ref 0.0–3.0)
EOS PCT: 4 % (ref 0.0–5.0)
Eosinophils Absolute: 0.4 10*3/uL (ref 0.0–0.7)
HCT: 48.2 % (ref 39.0–52.0)
Hemoglobin: 16.8 g/dL (ref 13.0–17.0)
LYMPHS ABS: 2.5 10*3/uL (ref 0.7–4.0)
Lymphocytes Relative: 27.2 % (ref 12.0–46.0)
MCHC: 34.8 g/dL (ref 30.0–36.0)
MCV: 93.8 fl (ref 78.0–100.0)
MONO ABS: 0.7 10*3/uL (ref 0.1–1.0)
Monocytes Relative: 7.5 % (ref 3.0–12.0)
NEUTROS PCT: 61 % (ref 43.0–77.0)
Neutro Abs: 5.6 10*3/uL (ref 1.4–7.7)
PLATELETS: 239 10*3/uL (ref 150.0–400.0)
RBC: 5.14 Mil/uL (ref 4.22–5.81)
RDW: 12.9 % (ref 11.5–15.5)
WBC: 9.2 10*3/uL (ref 4.0–10.5)

## 2014-10-18 LAB — LIPID PANEL
CHOL/HDL RATIO: 4
Cholesterol: 155 mg/dL (ref 0–200)
HDL: 35.3 mg/dL — AB (ref >39.00)
LDL Cholesterol: 92 mg/dL (ref 0–99)
NONHDL: 119.64
Triglycerides: 140 mg/dL (ref 0.0–149.0)
VLDL: 28 mg/dL (ref 0.0–40.0)

## 2014-10-18 LAB — HEMOGLOBIN A1C: HEMOGLOBIN A1C: 6.7 % — AB (ref 4.6–6.5)

## 2014-10-18 LAB — TSH: TSH: 2.77 u[IU]/mL (ref 0.35–4.50)

## 2014-10-18 LAB — ALT: ALT: 52 U/L (ref 0–53)

## 2014-10-18 LAB — AST: AST: 39 U/L — AB (ref 0–37)

## 2014-10-18 MED ORDER — SILDENAFIL CITRATE 20 MG PO TABS: 40.0000 mg | ORAL_TABLET | Freq: Every day | ORAL | Status: DC | PRN

## 2014-10-18 NOTE — Patient Instructions (Signed)
Get your blood work before you leave     Next visit  for a    routine checkup in 6 months  (15 minutes) Please schedule an appointment at the front desk No need to come back fasting

## 2014-10-18 NOTE — Progress Notes (Signed)
Subjective:    Patient ID: Jimmy Valentine, male    DOB: 10/05/1950, 64 y.o.   MRN: [id number]  DOS:  10/18/2014 Type of visit - description : Complete physical exam  Interval history: No concerns   Review of Systems  Constitutional: No fever. No chills. No unexplained wt changes. No unusual sweats  HEENT: No dental problems, no ear discharge, no facial swelling, no voice changes. No eye discharge, no eye  redness , no  intolerance to light   Respiratory: No wheezing , no  difficulty breathing. No cough , no mucus production  Cardiovascular: No CP, no leg swelling , no  Palpitations  GI: no nausea, no vomiting, no diarrhea , no  abdominal pain.  No blood in the stools. No dysphagia, no odynophagia    Endocrine: No polyphagia, no polyuria , no polydipsia  GU: No dysuria, gross hematuria, difficulty urinating. No urinary urgency, no frequency.  Musculoskeletal: No joint swellings or unusual aches or pains  Skin: No change in the color of the skin, palor , no  Rash  Allergic, immunologic: No environmental allergies , no  food allergies  Neurological: No dizziness no  syncope. No headaches. No diplopia, no slurred, no slurred speech, no motor deficits, no facial  Numbness  Hematological: No enlarged lymph nodes, no easy bruising , no unusual bleedings  Psychiatry: No suicidal ideas, no hallucinations, no beavior problems, no confusion.  No unusual/severe anxiety, no depression   Past Medical History  Diagnosis Date  . Hypertension 1980s  . Hyperlipidemia 1990s  . 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 Erectile dysfunction   . Sleep apnea     does't use cpap  . Elevated LFTs   . Arthritis   . Gout 09/03/2011    RF colchicine      Past Surgical History  Procedure Laterality Date  . Total knee arthroplasty Right 2005  . Total knee arthroplasty  12/04/2011    Procedure: TOTAL KNEE ARTHROPLASTY;  Surgeon:  Sydnee Cabal, MD;  Location: [facility];  Service: Orthopedics;  Laterality: Left;  . Colonoscopy      >51yr ago at [facility] -normal per pt.    Social History   Social History  . Marital Status: Married    Spouse Name: married  . Number of Children: 0  . Years of Education: N/A   Occupational History  . sales rep    Social History Main Topics  . Smoking status: Former Smoker -- 1.00 packs/day for 15 years    Types: Cigarettes    Quit date: 12/04/1995  . Smokeless tobacco: Never Used  . Alcohol Use: 6.0 oz/week    12 Standard drinks or equivalent per week     Comment: social drinker  . Drug Use: No  . Sexual Activity: Not Currently   Other Topics Concern  . Not on file   Social History Narrative   Married, 2 step kids            Family History  Problem Relation Age of Onset  . Hypertension Mother   . Coronary artery disease Mother     M CABG at age 68s  . Alzheimer's disease Mother   . Diabetes Sister   . Colon cancer Neg Hx   . Prostate cancer Neg Hx   . Stomach cancer Neg Hx   . Rectal cancer Neg Hx        Medication List  This list is accurate as of: 10/18/14  2:43 PM.  Always use your most recent med list.               amLODipine 10 MG tablet  Commonly known as:  NORVASC  Take 1 tablet (10 mg total) by mouth daily.     bisacodyl 5 MG EC tablet  Commonly known as:  DULCOLAX  Take 5 mg by mouth once. For colon 5-20     colchicine 0.6 MG tablet  TAKE 1 TABLET BY MOUTH TWICE DAILY AS NEEDED     sildenafil 20 MG tablet  Commonly known as:  REVATIO  Take 2-3 tablets (40-60 mg total) by mouth daily as needed.     simvastatin 40 MG tablet  Commonly known as:  ZOCOR  Take 1 tablet (40 mg total) by mouth at bedtime.     valsartan-hydrochlorothiazide 320-25 MG per tablet  Commonly known as:  DIOVAN-HCT  Take 1 tablet by mouth daily.           Objective:   Physical Exam BP 126/76 mmHg  Pulse 56  Temp(Src) 97.7 F (36.5 C) (Oral)  Ht  5\' 9"  (1.753 m)  Wt 241 lb 4 oz (109.43 kg)  BMI 35.61 kg/m2  SpO2 97% General:   Well developed, well nourished . NAD.  HEENT:  Normocephalic . Face symmetric, atraumatic Neck: No thyromegaly, normal carotid pulses  Lungs:  CTA B Normal respiratory effort, no intercostal retractions, no accessory muscle use. Heart: RRR,  no murmur.  no pretibial edema bilaterally  Abdomen:  Not distended, soft, non-tender. No rebound or rigidity. No mass,organomegaly Skin: Not pale. Not jaundice Neurologic:  alert & oriented X3.  Speech normal, gait appropriate for age and unassisted Psych--  Cognition and judgment appear intact.  Cooperative with normal attention span and concentration.  Behavior appropriate. No anxious or depressed appearing.    Assessment & Plan:   A/P Hypertension: Seems to be well-controlled, continue with present care  Hypogonadism: Dx few years ago by endocrinology, secondary hypogonadism. Was prescribed testosterone, did not notice any subjective difference while on meds. Currently on no HRT. DEXA 2014 normal  Erectile dysfunction: Prescribed generic Viagra Elevated LFTs: Reports a previous PCP noted increase few years ago, additional blood work was done but no results are available. Will check hepatitis serologies today Diabetes: Check labs Hyperlipidemia: On simvastatin, last LDL 105, labs

## 2014-10-18 NOTE — Assessment & Plan Note (Addendum)
TD 2014; 2011 shingles shot  ; pneumonia shot 23-- 04/21/13; prevnar 9/11/ 2015; flu shot today Cscope at age 64, normal, Dr Rexene Alberts 2016 normal, Dr Hilarie Fredrickson, 10 years DRE PSA WNL 2015 Labs Diet-exercise discussed

## 2014-10-18 NOTE — Assessment & Plan Note (Signed)
Hypertension: Seems to be well-controlled, continue with present care  Hypogonadism: Dx few years ago by endocrinology, secondary hypogonadism. Was prescribed testosterone, did not notice any subjective difference while on meds. Currently on no HRT. DEXA 2014 normal  Erectile dysfunction: Prescribed generic Viagra Elevated LFTs: Reports a previous PCP noted increase few years ago, additional blood work was done but no results are available. Will check hepatitis serologies today Diabetes: Check labs Hyperlipidemia: On simvastatin, last LDL 105, labs

## 2014-10-18 NOTE — Progress Notes (Signed)
Pre visit review using our clinic review tool, if applicable. No additional management support is needed unless otherwise documented below in the visit note. 

## 2014-10-19 LAB — HEPATITIS B SURFACE ANTIBODY,QUALITATIVE: HEP B S AB: NEGATIVE

## 2014-10-19 LAB — HEPATITIS B SURFACE ANTIGEN: Hepatitis B Surface Ag: NEGATIVE

## 2014-10-19 LAB — HEPATITIS C ANTIBODY: HCV Ab: NEGATIVE

## 2014-10-22 ENCOUNTER — Telehealth: Payer: Self-pay | Admitting: Internal Medicine

## 2014-10-22 NOTE — Telephone Encounter (Signed)
error:[id number] ° °

## 2015-02-03 DIAGNOSIS — H9122 Sudden idiopathic hearing loss, left ear: Secondary | ICD-10-CM

## 2015-02-03 HISTORY — DX: Sudden idiopathic hearing loss, left ear: H91.22

## 2015-02-14 ENCOUNTER — Other Ambulatory Visit: Payer: Self-pay | Admitting: Internal Medicine

## 2015-04-19 ENCOUNTER — Ambulatory Visit: Payer: BLUE CROSS/BLUE SHIELD | Admitting: Internal Medicine

## 2015-04-26 ENCOUNTER — Encounter: Payer: Self-pay | Admitting: Internal Medicine

## 2015-04-26 ENCOUNTER — Ambulatory Visit (INDEPENDENT_AMBULATORY_CARE_PROVIDER_SITE_OTHER): Payer: BLUE CROSS/BLUE SHIELD | Admitting: Internal Medicine

## 2015-04-26 VITALS — BP 122/62 | HR 58 | Temp 98.5°F | Ht 69.0 in | Wt 242.1 lb

## 2015-04-26 DIAGNOSIS — E119 Type 2 diabetes mellitus without complications: Secondary | ICD-10-CM

## 2015-04-26 DIAGNOSIS — N529 Male erectile dysfunction, unspecified: Secondary | ICD-10-CM | POA: Diagnosis not present

## 2015-04-26 DIAGNOSIS — I10 Essential (primary) hypertension: Secondary | ICD-10-CM | POA: Diagnosis not present

## 2015-04-26 LAB — BASIC METABOLIC PANEL
BUN: 26 mg/dL — AB (ref 6–23)
CALCIUM: 9.7 mg/dL (ref 8.4–10.5)
CO2: 25 mEq/L (ref 19–32)
Chloride: 100 mEq/L (ref 96–112)
Creatinine, Ser: 1.12 mg/dL (ref 0.40–1.50)
GFR: 69.46 mL/min (ref >60.00)
GLUCOSE: 153 mg/dL — AB (ref 70–99)
POTASSIUM: 4 meq/L (ref 3.5–5.1)
SODIUM: 135 meq/L (ref 135–145)

## 2015-04-26 LAB — HEMOGLOBIN A1C: Hgb A1c MFr Bld: 7.7 % — ABNORMAL HIGH (ref 4.6–6.5)

## 2015-04-26 MED ORDER — SILDENAFIL CITRATE 20 MG PO TABS: 60.0000 mg | ORAL_TABLET | Freq: Every day | ORAL | Status: DC | PRN

## 2015-04-26 NOTE — Assessment & Plan Note (Signed)
DM: Previous A1c is reviewed with the patient, Concept of A1c discussed. Recommend a active, eat healthy, regular eye exams; feet exam negative today ; check A1c HTN: Continue amlodipine, Diovan HCT. Check a BMP ED: Needs a refill on Viagra. Provided. Hypogonadism: Previous results reviewed, interested in rechecking, will do on RTC RTC 6 months, CPX

## 2015-04-26 NOTE — Progress Notes (Signed)
Subjective:    Patient ID: Jimmy Valentine, male    DOB: 1950/06/14, 65 y.o.   MRN: [id number]  DOS:  04/26/2015 Type of visit - description : Routine office visit Interval history:  Lost his mother to dementia, had a hard time but overall is doing well emotionally. Medications reviewed: Good compliance. Had a zostavax ~2011, will enter that in our system. Due for a eye exam, patient aware   Review of Systems  denies any lower extremity paresthesias or tingling.   Past Medical History  Diagnosis Date  . Hypertension 1980s  . Hyperlipidemia 1990s  . 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 Erectile dysfunction   . Sleep apnea     does't use cpap  . Elevated LFTs   . Arthritis   . Gout 09/03/2011    RF colchicine      Past Surgical History  Procedure Laterality Date  . Total knee arthroplasty Right 2005  . Total knee arthroplasty  12/04/2011    Procedure: TOTAL KNEE ARTHROPLASTY;  Surgeon: Sydnee Cabal, MD;  Location: [facility];  Service: Orthopedics;  Laterality: Left;  . Colonoscopy      >37yr ago at [facility] -normal per pt.    Social History   Social History  . Marital Status: Married    Spouse Name: married  . Number of Children: 0  . Years of Education: N/A   Occupational History  . sales rep    Social History Main Topics  . Smoking status: Former Smoker -- 1.00 packs/day for 15 years    Types: Cigarettes    Quit date: 12/04/1995  . Smokeless tobacco: Never Used  . Alcohol Use: 6.0 oz/week    12 Standard drinks or equivalent per week     Comment: social drinker  . Drug Use: No  . Sexual Activity: Not Currently   Other Topics Concern  . Not on file   Social History Narrative   Married, 2 step kids               Medication List       This list is accurate as of: 04/26/15  6:31 PM.  Always use your most recent med list.               amLODipine 10 MG tablet  Commonly known as:   NORVASC  Take 1 tablet (10 mg total) by mouth daily.     bisacodyl 5 MG EC tablet  Commonly known as:  DULCOLAX  Take 5 mg by mouth once. For colon 5-20     colchicine 0.6 MG tablet  TAKE 1 TABLET BY MOUTH TWICE DAILY AS NEEDED     sildenafil 20 MG tablet  Commonly known as:  REVATIO  Take 3-4 tablets (60-80 mg total) by mouth daily as needed.     simvastatin 40 MG tablet  Commonly known as:  ZOCOR  Take 1 tablet (40 mg total) by mouth at bedtime.     valsartan-hydrochlorothiazide 320-25 MG tablet  Commonly known as:  DIOVAN-HCT  Take 1 tablet by mouth daily.           Objective:   Physical Exam BP 122/62 mmHg  Pulse 58  Temp(Src) 98.5 F (36.9 C) (Oral)  Ht 5\' 9"  (1.753 m)  Wt 242 lb 2 oz (109.827 kg)  BMI 35.74 kg/m2  SpO2 98% General:   Well developed, well nourished . NAD.  HEENT:  Normocephalic .  Face symmetric, atraumatic Lungs:  CTA B Normal respiratory effort, no intercostal retractions, no accessory muscle use. Heart: RRR,  no murmur.  no pretibial edema bilaterally  Abdomen:  Not distended, soft, non-tender. No rebound or rigidity.  Skin: Not pale. Not jaundice DIABETIC FEET EXAM: No lower extremity edema Normal pedal pulses bilaterally Skin normal, nails normal, no calluses Pinprick examination of the feet normal. Neurologic:  alert & oriented X3.  Speech normal, gait appropriate for age and unassisted Psych--  Cognition and judgment appear intact.  Cooperative with normal attention span and concentration.  Behavior appropriate. No anxious or depressed appearing.    Assessment & Plan:  Assessment DM HTN Hyperlipidemia Hypogonadism Dx few years ago by endocrinology, secondary hypogonadism. Was prescribed testosterone, did not notice any subjective difference while on meds. Currently on no HRT. DEXA 2014 normal  Erectile dysfunction Sleep apnea, no CPAP DJD Gout Elevated LFTs x years, previous w/u (-) per pt ; hep B and C (-)  10-2014  Plan:  DM: Previous A1c is reviewed with the patient, Concept of A1c discussed. Recommend a active, eat healthy, regular eye exams; feet exam negative today ; check A1c HTN: Continue amlodipine, Diovan HCT. Check a BMP ED: Needs a refill on Viagra. Provided. Hypogonadism: Previous results reviewed, interested in rechecking, will do on RTC RTC 6 months, CPX

## 2015-04-26 NOTE — Progress Notes (Signed)
Pre visit review using our clinic review tool, if applicable. No additional management support is needed unless otherwise documented below in the visit note. 

## 2015-04-26 NOTE — Patient Instructions (Signed)
GO TO THE LAB :      Get the blood work     GO TO THE FRONT DESK Schedule your next appointment for a  physical When?   6 months Fasting?  Yes, 8 AM appointment

## 2015-04-29 MED ORDER — METFORMIN HCL 850 MG PO TABS: 850.0000 mg | ORAL_TABLET | Freq: Two times a day (BID) | ORAL | Status: DC

## 2015-04-29 NOTE — Addendum Note (Signed)
Addended byDamita Dunnings D on: 04/29/2015 09:16 AM   Modules accepted: Orders

## 2015-07-25 ENCOUNTER — Ambulatory Visit (INDEPENDENT_AMBULATORY_CARE_PROVIDER_SITE_OTHER): Payer: BLUE CROSS/BLUE SHIELD | Admitting: Podiatry

## 2015-07-25 ENCOUNTER — Encounter: Payer: Self-pay | Admitting: Podiatry

## 2015-07-25 ENCOUNTER — Ambulatory Visit (INDEPENDENT_AMBULATORY_CARE_PROVIDER_SITE_OTHER): Payer: BLUE CROSS/BLUE SHIELD

## 2015-07-25 VITALS — BP 119/68 | HR 72 | Resp 16

## 2015-07-25 DIAGNOSIS — M722 Plantar fascial fibromatosis: Secondary | ICD-10-CM | POA: Diagnosis not present

## 2015-07-25 MED ORDER — TRIAMCINOLONE ACETONIDE 10 MG/ML IJ SUSP
10.0000 mg | Freq: Once | INTRAMUSCULAR | Status: AC
Start: 2015-07-25 — End: 2015-07-25
  Administered 2015-07-25: 10 mg

## 2015-07-25 NOTE — Patient Instructions (Signed)

## 2015-07-25 NOTE — Progress Notes (Signed)
   Subjective:    Patient ID: Jimmy Valentine, male    DOB: [birth year], 65 y.o.   MRN: [id number]  HPI    Review of Systems  All other systems reviewed and are negative.      Objective:   Physical Exam        Assessment & Plan:

## 2015-07-26 NOTE — Progress Notes (Signed)
Subjective:     Patient ID: Jimmy Valentine, male   DOB: Apr 04, 1950, 65 y.o.   MRN: [id number]  HPI patient presents with pain in the left heel that has been present for several months   Review of Systems  All other systems reviewed and are negative.      Objective:   Physical Exam  Constitutional: He is oriented to person, place, and time.  Cardiovascular: Intact distal pulses.   Musculoskeletal: Normal range of motion.  Neurological: He is oriented to person, place, and time.  Skin: Skin is warm.  Nursing note and vitals reviewed.  neurovascular status intact muscle strength adequate range of motion within normal limits with patient noted to have discomfort and inflammation of the plantar aspect left heel at the insertional point of the tendon into the calcaneus. It is localized in nature and is quite sore when palpated with good digital perfusion and well oriented 3     Assessment:     Acute plantar fasciitis left with inflammation and fluid around the medial band    Plan:     H&P and x-rays reviewed and injected the plantar fascial left 3 mg Kenalog 5 mill grams Xylocaine and applied fascial strapping and instructed on physical therapy  X-ray report indicates spur with no indications of stress fracture arthritis

## 2015-08-05 ENCOUNTER — Ambulatory Visit: Payer: BLUE CROSS/BLUE SHIELD | Admitting: Internal Medicine

## 2015-08-08 ENCOUNTER — Ambulatory Visit: Payer: BLUE CROSS/BLUE SHIELD | Admitting: Podiatry

## 2015-08-13 ENCOUNTER — Encounter: Payer: Self-pay | Admitting: Internal Medicine

## 2015-08-13 ENCOUNTER — Ambulatory Visit (INDEPENDENT_AMBULATORY_CARE_PROVIDER_SITE_OTHER): Payer: BLUE CROSS/BLUE SHIELD | Admitting: Internal Medicine

## 2015-08-13 VITALS — BP 108/72 | HR 60 | Temp 97.8°F | Ht 69.0 in | Wt 235.2 lb

## 2015-08-13 DIAGNOSIS — I10 Essential (primary) hypertension: Secondary | ICD-10-CM

## 2015-08-13 DIAGNOSIS — E119 Type 2 diabetes mellitus without complications: Secondary | ICD-10-CM | POA: Diagnosis not present

## 2015-08-13 DIAGNOSIS — E291 Testicular hypofunction: Secondary | ICD-10-CM

## 2015-08-13 LAB — BASIC METABOLIC PANEL
BUN: 24 mg/dL — ABNORMAL HIGH (ref 6–23)
CALCIUM: 9.8 mg/dL (ref 8.4–10.5)
CO2: 27 mEq/L (ref 19–32)
Chloride: 101 mEq/L (ref 96–112)
Creatinine, Ser: 1.12 mg/dL (ref 0.40–1.50)
GFR: 69.4 mL/min (ref >60.00)
GLUCOSE: 144 mg/dL — AB (ref 70–99)
Potassium: 4.2 mEq/L (ref 3.5–5.1)
SODIUM: 135 meq/L (ref 135–145)

## 2015-08-13 LAB — HEMOGLOBIN A1C: HEMOGLOBIN A1C: 6.2 % (ref 4.6–6.5)

## 2015-08-13 LAB — AST: AST: 24 U/L (ref 0–37)

## 2015-08-13 LAB — ALT: ALT: 43 U/L (ref 0–53)

## 2015-08-13 NOTE — Assessment & Plan Note (Signed)
DM: Doing well with metformin, has improved his lifestyle, check a BMP, AST, ALT, A1c. HTN: Controlled Hypogonadism: Check hormones. Primary care: Add aspirin RTC CPX 11-2015

## 2015-08-13 NOTE — Progress Notes (Signed)
Pre visit review using our clinic review tool, if applicable. No additional management support is needed unless otherwise documented below in the visit note. 

## 2015-08-13 NOTE — Patient Instructions (Signed)
GO TO THE LAB : Get the blood work     GO TO THE FRONT DESK Schedule your next appointment for a  Physical exam by 11-2015  Add a Asirin 81 mg a day to your medications

## 2015-08-13 NOTE — Progress Notes (Signed)
Subjective:    Patient ID: Jimmy Valentine, male    DOB: October 12, 1950, 65 y.o.   MRN: [id number]  DOS:  08/13/2015 Type of visit - description : Routine checkup Interval history: Since the last office visit, metformin was added for increase A1c. Good compliance and tolerance. No ambulatory CBGs He improved his diet, exercising more, + weight loss  Wt Readings from Last 3 Encounters:  08/13/15 235 lb 4 oz (106.709 kg)  04/26/15 242 lb 2 oz (109.827 kg)  10/18/14 241 lb 4 oz (109.43 kg)    Review of Systems  had diarrhea when he started metformin but it self resolved few days later No chest pain, difficulty breathing or lower extremity edema   Past Medical History  Diagnosis Date  . Hypertension 1980s  . Hyperlipidemia 1990s  . 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 Erectile dysfunction   . Sleep apnea     does't use cpap  . Elevated LFTs   . Arthritis   . Gout 09/03/2011    RF colchicine      Past Surgical History  Procedure Laterality Date  . Total knee arthroplasty Right 2005  . Total knee arthroplasty  12/04/2011    Procedure: TOTAL KNEE ARTHROPLASTY;  Surgeon: Sydnee Cabal, MD;  Location: [facility];  Service: Orthopedics;  Laterality: Left;  . Colonoscopy      >29yr ago at [facility] -normal per pt.    Social History   Social History  . Marital Status: Married    Spouse Name: married  . Number of Children: 0  . Years of Education: N/A   Occupational History  . sales rep    Social History Main Topics  . Smoking status: Former Smoker -- 1.00 packs/day for 15 years    Types: Cigarettes    Quit date: 12/04/1995  . Smokeless tobacco: Never Used  . Alcohol Use: 6.0 oz/week    12 Standard drinks or equivalent per week     Comment: social drinker  . Drug Use: No  . Sexual Activity: Not Currently   Other Topics Concern  . Not on file   Social History Narrative   Married, 2 step kids                Medication List       This list is accurate as of: 08/13/15 12:47 PM.  Always use your most recent med list.               amLODipine 10 MG tablet  Commonly known as:  NORVASC  Take 1 tablet (10 mg total) by mouth daily.     aspirin 81 MG tablet  Take 81 mg by mouth daily.     bisacodyl 5 MG EC tablet  Commonly known as:  DULCOLAX  Take 5 mg by mouth once. For colon 5-20     colchicine 0.6 MG tablet  TAKE 1 TABLET BY MOUTH TWICE DAILY AS NEEDED     metFORMIN 850 MG tablet  Commonly known as:  GLUCOPHAGE  Take 1 tablet (850 mg total) by mouth 2 (two) times daily with a meal. For the first 10 days, take only 1 tablet daily.     sildenafil 20 MG tablet  Commonly known as:  REVATIO  Take 3-4 tablets (60-80 mg total) by mouth daily as needed.     simvastatin 40 MG tablet  Commonly known as:  ZOCOR  Take 1 tablet (  40 mg total) by mouth at bedtime.     valsartan-hydrochlorothiazide 320-25 MG tablet  Commonly known as:  DIOVAN-HCT  Take 1 tablet by mouth daily.           Objective:   Physical Exam BP 108/72 mmHg  Pulse 60  Temp(Src) 97.8 F (36.6 C) (Oral)  Ht 5\' 9"  (1.753 m)  Wt 235 lb 4 oz (106.709 kg)  BMI 34.72 kg/m2  SpO2 96% General:   Well developed, well nourished . NAD.  HEENT:  Normocephalic . Face symmetric, atraumatic Lungs:  CTA B Normal respiratory effort, no intercostal retractions, no accessory muscle use. Heart: RRR,  no murmur.  No pretibial edema bilaterally  Skin: Not pale. Not jaundice Neurologic:  alert & oriented X3.  Speech normal, gait appropriate for age and unassisted Psych--  Cognition and judgment appear intact.  Cooperative with normal attention span and concentration.  Behavior appropriate. No anxious or depressed appearing.      Assessment & Plan:   Assessment DM, Started metformin 04-2015 HTN Hyperlipidemia Hypogonadism Dx few years ago by endocrinology, secondary hypogonadism. Was rx testosterone, did not  notice any subjective difference while on meds. Currently on no HRT. DEXA 2014 normal  Erectile dysfunction Sleep apnea, no CPAP DJD Gout Elevated LFTs x years, previous w/u (-) per pt ; hep B and C (-) 10-2014 Sees dermatology  PLAN: DM: Doing well with metformin, has improved his lifestyle, check a BMP, AST, ALT, A1c. HTN: Controlled Hypogonadism: Check hormones. Primary care: Add aspirin RTC CPX 11-2015

## 2015-08-14 ENCOUNTER — Other Ambulatory Visit: Payer: Self-pay | Admitting: Internal Medicine

## 2015-08-18 LAB — TESTOS,TOTAL,FREE AND SHBG (FEMALE)
Sex Hormone Binding Glob.: 29 nmol/L (ref 22–77)
TESTOSTERONE,FREE: 27.5 pg/mL — AB (ref 35.0–155.0)
TESTOSTERONE,TOTAL,LC/MS/MS: 241 ng/dL — AB (ref 250–1100)

## 2015-08-28 ENCOUNTER — Other Ambulatory Visit: Payer: Self-pay | Admitting: Internal Medicine

## 2015-08-29 ENCOUNTER — Other Ambulatory Visit: Payer: Self-pay

## 2015-08-29 MED ORDER — METFORMIN HCL 850 MG PO TABS: 850.0000 mg | ORAL_TABLET | Freq: Two times a day (BID) | ORAL | 5 refills | Status: DC

## 2015-10-31 ENCOUNTER — Ambulatory Visit (INDEPENDENT_AMBULATORY_CARE_PROVIDER_SITE_OTHER): Payer: BLUE CROSS/BLUE SHIELD | Admitting: Internal Medicine

## 2015-10-31 ENCOUNTER — Encounter: Payer: Self-pay | Admitting: Internal Medicine

## 2015-10-31 VITALS — BP 122/74 | HR 70 | Temp 98.1°F | Resp 14 | Ht 69.0 in | Wt 234.1 lb

## 2015-10-31 DIAGNOSIS — Z0000 Encounter for general adult medical examination without abnormal findings: Secondary | ICD-10-CM

## 2015-10-31 DIAGNOSIS — Z125 Encounter for screening for malignant neoplasm of prostate: Secondary | ICD-10-CM | POA: Diagnosis not present

## 2015-10-31 DIAGNOSIS — Z23 Encounter for immunization: Secondary | ICD-10-CM

## 2015-10-31 LAB — CBC WITH DIFFERENTIAL/PLATELET
BASOS PCT: 0.4 % (ref 0.0–3.0)
Basophils Absolute: 0 10*3/uL (ref 0.0–0.1)
EOS ABS: 0.5 10*3/uL (ref 0.0–0.7)
EOS PCT: 5.9 % — AB (ref 0.0–5.0)
HCT: 45.1 % (ref 39.0–52.0)
HEMOGLOBIN: 15.9 g/dL (ref 13.0–17.0)
LYMPHS ABS: 2.8 10*3/uL (ref 0.7–4.0)
LYMPHS PCT: 32.9 % (ref 12.0–46.0)
MCHC: 35.3 g/dL (ref 30.0–36.0)
MCV: 91.8 fl (ref 78.0–100.0)
MONO ABS: 0.6 10*3/uL (ref 0.1–1.0)
Monocytes Relative: 6.7 % (ref 3.0–12.0)
NEUTROS ABS: 4.5 10*3/uL (ref 1.4–7.7)
NEUTROS PCT: 54.1 % (ref 43.0–77.0)
Platelets: 235 10*3/uL (ref 150.0–400.0)
RBC: 4.92 Mil/uL (ref 4.22–5.81)
RDW: 13 % (ref 11.5–15.5)
WBC: 8.4 10*3/uL (ref 4.0–10.5)

## 2015-10-31 LAB — LIPID PANEL
CHOL/HDL RATIO: 4
CHOLESTEROL: 142 mg/dL (ref 0–200)
HDL: 32.8 mg/dL — ABNORMAL LOW (ref >39.00)
LDL CALC: 69 mg/dL (ref 0–99)
NonHDL: 109.29
TRIGLYCERIDES: 199 mg/dL — AB (ref 0.0–149.0)
VLDL: 39.8 mg/dL (ref 0.0–40.0)

## 2015-10-31 LAB — PSA: PSA: 1.19 ng/mL (ref 0.10–4.00)

## 2015-10-31 NOTE — Assessment & Plan Note (Signed)
DM: Continue metformin, last FLP excellent HTN: Continue amlodipine, valsartan- HCT, last BMP satisfactory Gout: No episodes in the last 5 or 6 months Hyperlipidemia: On simvastatin, check labs. Hypogonadism: See previous visit, hormones were not check, patient asx. RTC 4-5 months

## 2015-10-31 NOTE — Patient Instructions (Signed)
Get your blood work before you leave   Next visit 4-5 months    Fall Prevention and [place] cause injuries and can affect all age groups. It is possible to use preventive measures to significantly decrease the likelihood of falls. There are many simple measures which can make your home safer and prevent falls. OUTDOORS  Repair cracks and edges of walkways and driveways.  Remove high doorway thresholds.  Trim shrubbery on the main path into your home.  Have good outside lighting.  Clear walkways of tools, rocks, debris, and clutter.  Check that handrails are not broken and are securely fastened. Both sides of steps should have handrails.  Have leaves, snow, and ice cleared regularly.  Use sand or salt on walkways during winter months.  In the garage, clean up grease or oil spills. BATHROOM  Install night lights.  Install grab bars by the toilet and in the tub and shower.  Use non-skid mats or decals in the tub or shower.  Place a plastic non-slip stool in the shower to sit on, if needed.  Keep floors dry and clean up all water on the floor immediately.  Remove soap buildup in the tub or shower on a regular basis.  Secure bath mats with non-slip, double-sided rug tape.  Remove throw rugs and tripping hazards from the floors. BEDROOMS  Install night lights.  Make sure a bedside light is easy to reach.  Do not use oversized bedding.  Keep a telephone by your bedside.  Have a firm chair with side arms to use for getting dressed.  Remove throw rugs and tripping hazards from the floor. KITCHEN  Keep handles on pots and pans turned toward the center of the stove. Use back burners when possible.  Clean up spills quickly and allow time for drying.  Avoid walking on wet floors.  Avoid hot utensils and knives.  Position shelves so they are not too high or low.  Place commonly used objects within easy reach.  If necessary, use a sturdy step stool  with a grab bar when reaching.  Keep electrical cables out of the way.  Do not use floor polish or wax that makes floors slippery. If you must use wax, use non-skid floor wax.  Remove throw rugs and tripping hazards from the floor. STAIRWAYS  Never leave objects on stairs.  Place handrails on both sides of stairways and use them. Fix any loose handrails. Make sure handrails on both sides of the stairways are as long as the stairs.  Check carpeting to make sure it is firmly attached along stairs. Make repairs to worn or loose carpet promptly.  Avoid placing throw rugs at the top or bottom of stairways, or properly secure the rug with carpet tape to prevent slippage. Get rid of throw rugs, if possible.  Have an electrician put in a light switch at the top and bottom of the stairs. OTHER FALL PREVENTION TIPS  Wear low-heel or rubber-soled shoes that are supportive and fit well. Wear closed toe shoes.  When using a stepladder, make sure it is fully opened and both spreaders are firmly locked. Do not climb a closed stepladder.  Add color or contrast paint or tape to grab bars and handrails in your home. Place contrasting color strips on first and last steps.  Learn and use mobility aids as needed. Install an electrical emergency response system.  Turn on lights to avoid dark areas. Replace light bulbs that burn out immediately. Get light  out immediately. Get light switches that glow.  Arrange furniture to create clear pathways. Keep furniture in the same place.  Firmly attach carpet with non-skid or double-sided tape.  Eliminate uneven floor surfaces.  Select a carpet pattern that does not visually hide the edge of steps.  Be aware of all pets. OTHER HOME SAFETY TIPS  Set the water temperature for 120 F (48.8 C).  Keep emergency numbers on or near the telephone.  Keep smoke detectors on every level of the home and near sleeping areas. Document Released: 01/09/2002 Document Revised: 07/21/2011  Document Reviewed: 04/10/2011 ExitCare Patient Information 2015 [employer]. This information is not intended to replace advice given to you by your health care provider. Make sure you discuss any questions you have with your health care provider.   Preventive Care for Adults Ages 65 and over  Blood pressure check.** / Every 1 to 2 years.  Lipid and cholesterol check.**/ Every 5 years beginning at age 20.  Lung cancer screening. / Every year if you are aged 55-80 years and have a 30-pack-year history of smoking and currently smoke or have quit within the past 15 years. Yearly screening is stopped once you have quit smoking for at least 15 years or develop a health problem that would prevent you from having lung cancer treatment.  Fecal occult blood test (FOBT) of stool. / Every year beginning at age 50 and continuing until age 75. You may not have to do this test if you get a colonoscopy every 10 years.  Flexible sigmoidoscopy** or colonoscopy.** / Every 5 years for a flexible sigmoidoscopy or every 10 years for a colonoscopy beginning at age 50 and continuing until age 75.  Hepatitis C blood test.** / For all people born from 1945 through 1965 and any individual with known risks for hepatitis C.  Abdominal aortic aneurysm (AAA) screening.** / A one-time screening for ages 65 to 75 years who are current or former smokers.  Skin self-exam. / Monthly.  Influenza vaccine. / Every year.  Tetanus, diphtheria, and acellular pertussis (Tdap/Td) vaccine.** / 1 dose of Td every 10 years.  Varicella vaccine.** / Consult your health care provider.  Zoster vaccine.** / 1 dose for adults aged 60 years or older.  Pneumococcal 13-valent conjugate (PCV13) vaccine.** / Consult your health care provider.  Pneumococcal polysaccharide (PPSV23) vaccine.** / 1 dose for all adults aged 65 years and older.  Meningococcal vaccine.** / Consult your health care provider.  Hepatitis A vaccine.** /  Consult your health care provider.  Hepatitis B vaccine.** / Consult your health care provider.  Haemophilus influenzae type b (Hib) vaccine.** / Consult your health care provider. **Family history and personal history of risk and conditions may change your health care provider's recommendations. Document Released: 03/17/2001 Document Revised: 01/24/2013 Document Reviewed: 06/16/2010 ExitCare Patient Information 2015 [employer]. This information is not intended to replace advice given to you by your health care provider. Make sure you discuss any questions you have with your health care provider.   

## 2015-10-31 NOTE — Progress Notes (Signed)
Subjective:    Patient ID: Jimmy Valentine, male    DOB: [birth year], 65 y.o.   MRN: [id number]  DOS:  10/31/2015 Type of visit - description : cpx Interval history: No concerns, good medication compliance without apparent side effects   Review of Systems  Constitutional: No fever. No chills. No unexplained wt changes. No unusual sweats  HEENT: No dental problems, no ear discharge, no facial swelling, no voice changes. No eye discharge, no eye  redness , no  intolerance to light   Respiratory: No wheezing , no  difficulty breathing. No cough , no mucus production  Cardiovascular: No CP, no leg swelling , no  Palpitations  GI: no nausea, no vomiting, no diarrhea , no  abdominal pain.  No blood in the stools. No dysphagia, no odynophagia    Endocrine: No polyphagia, no polyuria , no polydipsia  GU: No dysuria, gross hematuria, difficulty urinating. No urinary urgency, no frequency.  Musculoskeletal: No joint swellings or unusual aches or pains  Skin: No change in the color of the skin, palor , no  Rash  Allergic, immunologic: No environmental allergies , no  food allergies  Neurological: No dizziness no  syncope. No headaches. No diplopia, no slurred, no slurred speech, no motor deficits, no facial  Numbness  Hematological: No enlarged lymph nodes, no easy bruising , no unusual bleedings  Psychiatry: No suicidal ideas, no hallucinations, no beavior problems, no confusion.  No unusual/severe anxiety, no depression  Past Medical History:  Diagnosis Date  . Arthritis   . Elevated LFTs   . Erectile dysfunction   . Gout 09/03/2011   RF colchicine    . Hyperlipidemia 1990s  . Hypertension 1980s  . 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 Sleep apnea    does't use cpap    Past Surgical History:  Procedure Laterality Date  . TOTAL KNEE ARTHROPLASTY Right 2005  . TOTAL KNEE ARTHROPLASTY  12/04/2011   Procedure:  TOTAL KNEE ARTHROPLASTY;  Surgeon: Sydnee Cabal, MD;  Location: [facility];  Service: Orthopedics;  Laterality: Left;    Social History   Social History  . Marital status: Married    Spouse name: married  . Number of children: 0  . Years of education: N/A   Occupational History  . sales rep    Social History Main Topics  . Smoking status: Former Smoker    Packs/day: 1.00    Years: 15.00    Types: Cigarettes    Quit date: 12/04/1995  . Smokeless tobacco: Never Used  . Alcohol use 6.0 oz/week    12 Standard drinks or equivalent per week     Comment: social drinker  . Drug use: No  . Sexual activity: Not Currently   Other Topics Concern  . Not on file   Social History Narrative   Married, 2 step kids            Family History  Problem Relation Age of Onset  . Hypertension Mother   . Coronary artery disease Mother     M CABG at age 82s  . Alzheimer's disease Mother   . Diabetes Sister   . Colon cancer Neg Hx   . Prostate cancer Neg Hx   . Stomach cancer Neg Hx   . Rectal cancer Neg Hx        Medication List       Accurate as of 10/31/15  4:52 PM. Always use  your most recent med list.          amLODipine 10 MG tablet Commonly known as:  NORVASC Take 1 tablet (10 mg total) by mouth daily.   aspirin 81 MG tablet Take 81 mg by mouth daily.   bisacodyl 5 MG EC tablet Commonly known as:  DULCOLAX Take 5 mg by mouth once. For colon 5-20   colchicine 0.6 MG tablet TAKE 1 TABLET BY MOUTH TWICE DAILY AS NEEDED   metFORMIN 850 MG tablet Commonly known as:  GLUCOPHAGE Take 1 tablet (850 mg total) by mouth 2 (two) times daily with a meal.   sildenafil 20 MG tablet Commonly known as:  REVATIO Take 3-4 tablets (60-80 mg total) by mouth daily as needed.   simvastatin 40 MG tablet Commonly known as:  ZOCOR Take 1 tablet (40 mg total) by mouth at bedtime.   valsartan-hydrochlorothiazide 320-25 MG tablet Commonly known as:  DIOVAN-HCT Take 1 tablet by mouth  daily.          Objective:   Physical Exam BP 122/74 (BP Location: Left Arm, Patient Position: Sitting, Cuff Size: Normal)   Pulse 70   Temp 98.1 F (36.7 C) (Oral)   Resp 14   Ht 5\' 9"  (1.753 m)   Wt 234 lb 2 oz (106.2 kg)   SpO2 97%   BMI 34.57 kg/m   General:   Well developed, well nourished . NAD.  Neck: No  thyromegaly  HEENT:  Normocephalic . Face symmetric, atraumatic Lungs:  CTA B Normal respiratory effort, no intercostal retractions, no accessory muscle use. Heart: RRR,  no murmur.  No pretibial edema bilaterally  Abdomen:  Not distended, soft, non-tender. No rebound or rigidity.   Rectal:  External abnormalities: A few skin tags. Normal sphincter tone. No rectal masses or tenderness.  Stool brown  Prostate: Prostate gland firm and smooth, slightly enlargement but no nodularity, tenderness, mass, asymmetry or induration.  Skin: Exposed areas without rash. Not pale. Not jaundice Neurologic:  alert & oriented X3.  Speech normal, gait appropriate for age and unassisted Strength symmetric and appropriate for age.  Psych: Cognition and judgment appear intact.  Cooperative with normal attention span and concentration.  Behavior appropriate. No anxious or depressed appearing.    Assessment & Plan:   Assessment DM, Started metformin 04-2015 HTN Hyperlipidemia Hypogonadism Dx few years ago by endocrinology, secondary hypogonadism. Was rx testosterone, did not notice any subjective difference while on meds. Currently on no HRT. DEXA 2014 normal  Erectile dysfunction Sleep apnea, no CPAP DJD Gout Elevated LFTs x years, previous w/u (-) per pt ; hep B and C (-) 10-2014 Sees dermatology  PLAN: DM: Continue metformin, last FLP excellent HTN: Continue amlodipine, valsartan- HCT, last BMP satisfactory Gout: No episodes in the last 5 or 6 months Hyperlipidemia: On simvastatin, check labs. Hypogonadism: See previous visit, hormones were not check, patient  asx. RTC 4-5 months

## 2015-10-31 NOTE — Progress Notes (Signed)
Pre visit review using our clinic review tool, if applicable. No additional management support is needed unless otherwise documented below in the visit note. 

## 2015-10-31 NOTE — Assessment & Plan Note (Signed)
TD 2014; 2011 shingles shot  ; pneumonia shot 23-- 04/21/13; prevnar 9/11/ 2015;  flu shot today Cscope at age 65, normal, Dr Cher Nakai, Cscope 2016 normal, Dr Hilarie Fredrickson, 10 years DRE slt increase prostate size, check a PSA  Labs: FLP, CBC, PSA Diet-exercise-HC POA- fall prevention  discussed

## 2015-11-11 ENCOUNTER — Other Ambulatory Visit: Payer: Self-pay | Admitting: Internal Medicine

## 2015-12-16 ENCOUNTER — Other Ambulatory Visit: Payer: Self-pay | Admitting: Internal Medicine

## 2016-01-01 ENCOUNTER — Telehealth: Payer: Self-pay | Admitting: Internal Medicine

## 2016-01-01 NOTE — Telephone Encounter (Signed)
Spoke w/ Lattie Haw, Louise's nurse, Dr. Larose Kells recommendations given.

## 2016-01-01 NOTE — Telephone Encounter (Signed)
is okay however he needs to check his blood sugars twice a day and call if they are more than 200 consistently.

## 2016-01-01 NOTE — Telephone Encounter (Signed)
Caller name:Louise Sallee [place], [place] Relationship to patient: Can be reached:[phone] Pharmacy:  Reason for call:PA from [facility] calling, need ok for patient to take prednisone 60mg  15 day taper for sudden hearing loss. Please advise

## 2016-01-01 NOTE — Telephone Encounter (Signed)
FYI

## 2016-02-25 ENCOUNTER — Other Ambulatory Visit: Payer: Self-pay | Admitting: Internal Medicine

## 2016-04-06 ENCOUNTER — Encounter: Payer: Self-pay | Admitting: Internal Medicine

## 2016-04-06 ENCOUNTER — Ambulatory Visit (INDEPENDENT_AMBULATORY_CARE_PROVIDER_SITE_OTHER): Payer: BLUE CROSS/BLUE SHIELD | Admitting: Internal Medicine

## 2016-04-06 VITALS — BP 124/72 | HR 75 | Temp 97.6°F | Resp 14 | Ht 69.0 in | Wt 237.1 lb

## 2016-04-06 DIAGNOSIS — I10 Essential (primary) hypertension: Secondary | ICD-10-CM | POA: Diagnosis not present

## 2016-04-06 DIAGNOSIS — E118 Type 2 diabetes mellitus with unspecified complications: Secondary | ICD-10-CM | POA: Diagnosis not present

## 2016-04-06 LAB — HEMOGLOBIN A1C: Hgb A1c MFr Bld: 6.4 % (ref 4.6–6.5)

## 2016-04-06 LAB — BASIC METABOLIC PANEL
BUN: 26 mg/dL — AB (ref 6–23)
CALCIUM: 9.7 mg/dL (ref 8.4–10.5)
CO2: 23 meq/L (ref 19–32)
CREATININE: 1.18 mg/dL (ref 0.40–1.50)
Chloride: 102 mEq/L (ref 96–112)
GFR: 65.22 mL/min (ref >60.00)
GLUCOSE: 152 mg/dL — AB (ref 70–99)
Potassium: 4.2 mEq/L (ref 3.5–5.1)
SODIUM: 136 meq/L (ref 135–145)

## 2016-04-06 NOTE — Patient Instructions (Signed)
GO TO THE LAB : Get the blood work     GO TO THE FRONT DESK Schedule your next appointment for a  Physical in 6 months  

## 2016-04-06 NOTE — Progress Notes (Signed)
Subjective:    Patient ID: Jimmy Valentine, male    DOB: April 05, 1950, 66 y.o.   MRN: [id number]  DOS:  04/06/2016 Type of visit - description :  Check up Interval history: No major concerns Had sudden tinnitus and decreased hearing, now better Good medication compliance, ambulatory BPs checked every 2 weeks, normal. No ambulatory CBGs   Review of Systems No chest pain or difficulty breathing No lower extremity paresthesias  Past Medical History:  Diagnosis Date  . Arthritis   . Elevated LFTs   . Erectile dysfunction   . Gout 09/03/2011   RF colchicine    . Hyperlipidemia 1990s  . Hypertension 1980s  . 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 Sleep apnea    does't use cpap  . Sudden hearing loss, left 2017   idiopathic sensorineural hearing loss    Past Surgical History:  Procedure Laterality Date  . TOTAL KNEE ARTHROPLASTY Right 2005  . TOTAL KNEE ARTHROPLASTY  12/04/2011   Procedure: TOTAL KNEE ARTHROPLASTY;  Surgeon: Sydnee Cabal, MD;  Location: [facility];  Service: Orthopedics;  Laterality: Left;    Social History   Social History  . Marital status: Married    Spouse name: married  . Number of children: 0  . Years of education: N/A   Occupational History  . sales rep    Social History Main Topics  . Smoking status: Former Smoker    Packs/day: 1.00    Years: 15.00    Types: Cigarettes    Quit date: 12/04/1995  . Smokeless tobacco: Never Used  . Alcohol use 6.0 oz/week    12 Standard drinks or equivalent per week     Comment: social drinker  . Drug use: No  . Sexual activity: Not Currently   Other Topics Concern  . Not on file   Social History Narrative   Married, 2 step kids             Allergies as of 04/06/2016   No Known Allergies     Medication List       Accurate as of 04/06/16 11:59 PM. Always use your most recent med list.          amLODipine 10 MG tablet Commonly known  as:  NORVASC Take 1 tablet (10 mg total) by mouth daily.   aspirin 81 MG tablet Take 81 mg by mouth daily.   bisacodyl 5 MG EC tablet Commonly known as:  DULCOLAX Take 5 mg by mouth once. For colon 5-20   colchicine 0.6 MG tablet Commonly known as:  COLCRYS Take 1 tablet (0.6 mg total) by mouth 2 (two) times daily as needed.   metFORMIN 850 MG tablet Commonly known as:  GLUCOPHAGE Take 1 tablet (850 mg total) by mouth 2 (two) times daily with a meal.   sildenafil 20 MG tablet Commonly known as:  REVATIO Take 3-4 tablets (60-80 mg total) by mouth daily as needed.   simvastatin 40 MG tablet Commonly known as:  ZOCOR TAKE 1 TABLET(40 MG) BY MOUTH AT BEDTIME   valsartan-hydrochlorothiazide 320-25 MG tablet Commonly known as:  DIOVAN-HCT Take 1 tablet by mouth daily.          Objective:   Physical Exam BP 124/72 (BP Location: Left Arm, Patient Position: Sitting, Cuff Size: Normal)   Pulse 75   Temp 97.6 F (36.4 C) (Oral)   Resp 14   Ht 5\' 9"  (1.753 m)  Wt 237 lb 2 oz (107.6 kg)   SpO2 98%   BMI 35.02 kg/m  General:   Well developed, well nourished . NAD.  HEENT:  Normocephalic . Face symmetric, atraumatic Lungs:  CTA B Normal respiratory effort, no intercostal retractions, no accessory muscle use. Heart: RRR,  no murmur.  No pretibial edema bilaterally  DIABETIC FEET EXAM: No lower extremity edema Normal pedal pulses bilaterally Skin normal, nails normal, no calluses Pinprick examination of the feet normal. Skin: Not pale. Not jaundice Neurologic:  alert & oriented X3.  Speech normal, gait appropriate for age and unassisted Psych--  Cognition and judgment appear intact.  Cooperative with normal attention span and concentration.  Behavior appropriate. No anxious or depressed appearing.      Assessment & Plan:    Assessment DM, Started metformin 04-2015 HTN Hyperlipidemia Hypogonadism Dx few years ago by endocrinology, secondary hypogonadism.  Was rx testosterone, did not notice any subjective difference while on meds. Currently on no HRT. DEXA 2014 normal  Erectile dysfunction Sleep apnea, no CPAP DJD Gout Elevated LFTs x years, previous w/u (-) per pt ; hep B and C (-) 10-2014 Sees dermatology  PLAN: DM: Continue metformin, Foot exam normal, check A1c. Feet care discuss HTN: Continue amlodipine, valsartan- HCT, ambulatory BPs normal, check a BMP  Sudden sensory hearing loss: Resolved after steroids. RTC-6 months, CPX

## 2016-04-06 NOTE — Progress Notes (Signed)
Pre visit review using our clinic review tool, if applicable. No additional management support is needed unless otherwise documented below in the visit note. 

## 2016-04-07 NOTE — Assessment & Plan Note (Signed)
DM: Continue metformin, Foot exam normal, check A1c. Feet care discuss HTN: Continue amlodipine, valsartan- HCT, ambulatory BPs normal, check a BMP  Sudden sensory hearing loss: Resolved after steroids. RTC-6 months, CPX

## 2016-05-11 ENCOUNTER — Other Ambulatory Visit: Payer: Self-pay | Admitting: Internal Medicine

## 2016-08-27 ENCOUNTER — Other Ambulatory Visit: Payer: Self-pay | Admitting: Internal Medicine

## 2016-09-24 DIAGNOSIS — H903 Sensorineural hearing loss, bilateral: Secondary | ICD-10-CM | POA: Insufficient documentation

## 2016-11-04 ENCOUNTER — Encounter: Payer: BLUE CROSS/BLUE SHIELD | Admitting: Internal Medicine

## 2016-11-24 ENCOUNTER — Encounter: Payer: Self-pay | Admitting: Internal Medicine

## 2016-11-24 ENCOUNTER — Ambulatory Visit (INDEPENDENT_AMBULATORY_CARE_PROVIDER_SITE_OTHER): Payer: BLUE CROSS/BLUE SHIELD | Admitting: Internal Medicine

## 2016-11-24 VITALS — BP 126/72 | HR 56 | Temp 98.1°F | Resp 14 | Ht 69.0 in | Wt 232.0 lb

## 2016-11-24 DIAGNOSIS — E118 Type 2 diabetes mellitus with unspecified complications: Secondary | ICD-10-CM | POA: Diagnosis not present

## 2016-11-24 DIAGNOSIS — Z0000 Encounter for general adult medical examination without abnormal findings: Secondary | ICD-10-CM | POA: Diagnosis not present

## 2016-11-24 DIAGNOSIS — Z23 Encounter for immunization: Secondary | ICD-10-CM

## 2016-11-24 LAB — COMPREHENSIVE METABOLIC PANEL
ALT: 62 U/L — AB (ref 0–53)
AST: 34 U/L (ref 0–37)
Albumin: 4.4 g/dL (ref 3.5–5.2)
Alkaline Phosphatase: 66 U/L (ref 39–117)
BILIRUBIN TOTAL: 0.7 mg/dL (ref 0.2–1.2)
BUN: 21 mg/dL (ref 6–23)
CALCIUM: 9.7 mg/dL (ref 8.4–10.5)
CO2: 30 meq/L (ref 19–32)
CREATININE: 1.02 mg/dL (ref 0.40–1.50)
Chloride: 100 mEq/L (ref 96–112)
GFR: 77.02 mL/min (ref >60.00)
GLUCOSE: 124 mg/dL — AB (ref 70–99)
Potassium: 3.9 mEq/L (ref 3.5–5.1)
SODIUM: 138 meq/L (ref 135–145)
Total Protein: 7.3 g/dL (ref 6.0–8.3)

## 2016-11-24 LAB — LIPID PANEL
CHOL/HDL RATIO: 4
Cholesterol: 154 mg/dL (ref 0–200)
HDL: 38.6 mg/dL — ABNORMAL LOW (ref >39.00)
LDL CALC: 80 mg/dL (ref 0–99)
NONHDL: 115.33
TRIGLYCERIDES: 177 mg/dL — AB (ref 0.0–149.0)
VLDL: 35.4 mg/dL (ref 0.0–40.0)

## 2016-11-24 LAB — HEMOGLOBIN A1C: HEMOGLOBIN A1C: 6.4 % (ref 4.6–6.5)

## 2016-11-24 MED ORDER — SILDENAFIL CITRATE 20 MG PO TABS: 60.0000 mg | ORAL_TABLET | Freq: Every day | ORAL | 6 refills | Status: DC | PRN

## 2016-11-24 NOTE — Patient Instructions (Signed)
GO TO THE LAB : Get the blood work     GO TO THE FRONT DESK Schedule your next appointment for a  checkup in 6 months   Check the  blood pressure 2 or 3 times a month  Be sure your blood pressure is between 110/65 and  145/85. If it is consistently higher or lower, let me know  Call if valsartan HCT is not available

## 2016-11-24 NOTE — Assessment & Plan Note (Signed)
-  TD 2014; 2011 shingles shot  ; pneumonia shot 23-- 04/21/13; prevnar 9/11/ 2015; shingrix discussed. flu shot today -[facility]: Cscope at age 66, normal, Dr Cher Nakai, Cscope 2016 normal, Dr Hilarie Fredrickson, 10 years -prostate ca screening: DRE done 2017, slt increase prostate size, last PSA 2017 wnl -Labs: CMP, FLP, A1c -Diet-exercise:  Discussed

## 2016-11-24 NOTE — Progress Notes (Signed)
Subjective:    Patient ID: Jimmy Valentine, male    DOB: Jul 24, 1950, 66 y.o.   MRN: [id number]  DOS:  11/24/2016 Type of visit - description : cpx Interval history: Few months ago, lost his wife , had a hard time, dealing w/ his loss "ok"; decided not to retire and continue working, has a very good relationship with his step children.   Review of Systems No suicidal ideas  Other than above, a 14 point review of systems is negative     Past Medical History:  Diagnosis Date  . Arthritis   . Elevated LFTs   . Erectile dysfunction   . Gout 09/03/2011   RF colchicine    . Hyperlipidemia 1990s  . Hypertension 1980s  . 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 Sleep apnea    does't use cpap  . Sudden hearing loss, left 2017   idiopathic sensorineural hearing loss    Past Surgical History:  Procedure Laterality Date  . TOTAL KNEE ARTHROPLASTY Right 2005  . TOTAL KNEE ARTHROPLASTY  12/04/2011   LEFT-- TOTAL KNEE ARTHROPLASTY;  Surgeon: Sydnee Cabal, MD;  Location: [facility];  Service: Orthopedics;  Laterality: Left;    Social History   Social History  . Marital status: Widowed    Spouse name: married  . Number of children: 0  . Years of education: N/A   Occupational History  . sales rep    Social History Main Topics  . Smoking status: Former Smoker    Packs/day: 1.00    Years: 15.00    Types: Cigarettes    Quit date: 12/04/1995  . Smokeless tobacco: Never Used  . Alcohol use 6.0 oz/week    12 Standard drinks or equivalent per week     Comment: social drinker  . Drug use: No  . Sexual activity: Not Currently   Other Topics Concern  . Not on file   Social History Narrative   Lost first wife remotely.   Widow, lost 2nd wife 08-03-16   Lives alone, no biological children    2 step children from second wife , still very close to them           Family History  Problem Relation Age of Onset  .  Hypertension Mother   . Coronary artery disease Mother        M CABG at age 58s  . Alzheimer's disease Mother   . Diabetes Sister   . Colon cancer Neg Hx   . Prostate cancer Neg Hx   . Stomach cancer Neg Hx   . Rectal cancer Neg Hx      Allergies as of 11/24/2016   No Known Allergies     Medication List       Accurate as of 11/24/16  7:24 PM. Always use your most recent med list.          amLODipine 10 MG tablet Commonly known as:  NORVASC Take 1 tablet (10 mg total) by mouth daily.   aspirin 81 MG tablet Take 81 mg by mouth daily.   bisacodyl 5 MG EC tablet Commonly known as:  DULCOLAX Take 5 mg by mouth once. For colon 5-20   colchicine 0.6 MG tablet Commonly known as:  COLCRYS Take 1 tablet (0.6 mg total) by mouth 2 (two) times daily as needed.   metFORMIN 850 MG tablet Commonly known as:  GLUCOPHAGE Take 1 tablet (850 mg total) by  mouth 2 (two) times daily with a meal.   sildenafil 20 MG tablet Commonly known as:  REVATIO Take 3-4 tablets (60-80 mg total) by mouth daily as needed.   simvastatin 40 MG tablet Commonly known as:  ZOCOR Take 1 tablet (40 mg total) by mouth at bedtime.   valsartan-hydrochlorothiazide 320-25 MG tablet Commonly known as:  DIOVAN-HCT Take 1 tablet by mouth daily.          Objective:   Physical Exam BP 126/72 (BP Location: Left Arm, Patient Position: Sitting, Cuff Size: Normal)   Pulse (!) 56   Temp 98.1 F (36.7 C) (Oral)   Resp 14   Ht 5\' 9"  (1.753 m)   Wt 232 lb (105.2 kg)   SpO2 98%   BMI 34.26 kg/m   General:   Well developed, well nourished . NAD.  Neck: No  thyromegaly  HEENT:  Normocephalic . Face symmetric, atraumatic Lungs:  CTA B Normal respiratory effort, no intercostal retractions, no accessory muscle use. Heart: RRR,  no murmur.  No pretibial edema bilaterally  Abdomen:  Not distended, soft, non-tender. No rebound or rigidity.   Skin: Exposed areas without rash. Not pale. Not  jaundice Neurologic:  alert & oriented X3.  Speech normal, gait appropriate for age and unassisted Strength symmetric and appropriate for age.  Psych: Cognition and judgment appear intact.  Cooperative with normal attention span and concentration.  Behavior appropriate.  he got emotional about his wife who died recently    Assessment & Plan:   Assessment DM, Started metformin 04-2015 HTN Hyperlipidemia Hypogonadism Dx few years ago by endocrinology, secondary hypogonadism. Was rx testosterone, did not notice any subjective difference while on meds. Currently on no HRT. DEXA 2014 normal  Erectile dysfunction Sleep apnea, mild, saw pulm 2014, CPAP was not mandatory, mostly to increase his QOL (see pulm note) DJD Gout Elevated LFTs x years, previous w/u (-) per pt ; hep B and C (-) 10-2014 Sees dermatology  PLAN: DM: On metformin, no ambulatory CBGs, check A1c, would like to DC metformin if possible. We'll do if A1c is close to 6.0. HTN: Well-controlled, checks frequently, continue amlodipine, Diovan HCT. Check labs High cholesterol: On simvastatin, labs Erectile dysfunction: Has not been sexually active for months but would like to have a refill on Viagra.  Gout: No recent symptoms, has colchicine just in case Grieving: d/t wife's death, he is obviously affected by his loss, he however seems to be grieving appropriately, knows to call if help is needed RTC 6 months

## 2016-11-24 NOTE — Assessment & Plan Note (Signed)
DM: On metformin, no ambulatory CBGs, check A1c, would like to DC metformin if possible. We'll do if A1c is close to 6.0. HTN: Well-controlled, checks frequently, continue amlodipine, Diovan HCT. Check labs High cholesterol: On simvastatin, labs Erectile dysfunction: Has not been sexually active for months but would like to have a refill on Viagra.  Gout: No recent symptoms, has colchicine just in case Grieving: d/t wife's death, he is obviously affected by his loss, he however seems to be grieving appropriately, knows to call if help is needed RTC 6 months

## 2016-11-24 NOTE — Progress Notes (Signed)
Pre visit review using our clinic review tool, if applicable. No additional management support is needed unless otherwise documented below in the visit note. 

## 2016-11-30 ENCOUNTER — Telehealth: Payer: Self-pay | Admitting: Internal Medicine

## 2016-11-30 NOTE — Telephone Encounter (Signed)
Pt called request mail 11/24/16 labs to pt home address [address] [place] [phone].

## 2016-11-30 NOTE — Telephone Encounter (Signed)
Notes recorded by Damita Dunnings, [place] on 11/27/2016 at 4:07 PM EDT Letter printed and mailed to Pt. ------  Notes recorded by Colon Branch, MD on 11/27/2016 at 1:37 PM EDT Send a letter Fritz Pickerel, your cholesterol and blood sugar are under excellent control, the liver tests is slightly elevated, will continue checking as usual. Other tests are very good. Continue the same medications.

## 2017-01-13 ENCOUNTER — Other Ambulatory Visit: Payer: Self-pay

## 2017-01-13 MED ORDER — VALSARTAN-HYDROCHLOROTHIAZIDE 320-25 MG PO TABS
1.0000 | ORAL_TABLET | Freq: Every day | ORAL | 2 refills | Status: DC
Start: 2017-01-13 — End: 2017-01-28

## 2017-01-13 MED ORDER — SIMVASTATIN 40 MG PO TABS: 40.0000 mg | ORAL_TABLET | Freq: Every day | ORAL | 2 refills | Status: DC

## 2017-01-13 MED ORDER — AMLODIPINE BESYLATE 10 MG PO TABS: 10.0000 mg | ORAL_TABLET | Freq: Every day | ORAL | 2 refills | Status: DC

## 2017-01-27 ENCOUNTER — Other Ambulatory Visit: Payer: Self-pay | Admitting: Internal Medicine

## 2017-01-27 NOTE — Telephone Encounter (Signed)
Self.   Refill for Amlodipine   Pharmacy: [facility]. [facility]

## 2017-01-27 NOTE — Telephone Encounter (Signed)
LOV 11/24/16 with Dr. Larose Kells / Per Epic medication  Filled on 01/13/17 for 90 tablets with 2 refills. Pharmacy   [id number] - [place].  Patient states he just wanted to change pharmacy because it is cheaper.  Per patient, he has [facility] to have [facility] prescription [facility].

## 2017-01-28 ENCOUNTER — Other Ambulatory Visit: Payer: Self-pay

## 2017-01-28 MED ORDER — METFORMIN HCL 850 MG PO TABS: 850.0000 mg | ORAL_TABLET | Freq: Two times a day (BID) | ORAL | 1 refills | Status: DC

## 2017-01-28 MED ORDER — SIMVASTATIN 40 MG PO TABS: 40.0000 mg | ORAL_TABLET | Freq: Every day | ORAL | 2 refills | Status: DC

## 2017-01-28 MED ORDER — AMLODIPINE BESYLATE 10 MG PO TABS: 10.0000 mg | ORAL_TABLET | Freq: Every day | ORAL | 2 refills | Status: DC

## 2017-01-28 MED ORDER — VALSARTAN-HYDROCHLOROTHIAZIDE 320-25 MG PO TABS: 1.0000 | ORAL_TABLET | Freq: Every day | ORAL | 2 refills | Status: DC

## 2017-01-28 NOTE — Addendum Note (Signed)
Addended byDamita Dunnings D on: 01/28/2017 03:29 PM   Modules accepted: Orders

## 2017-01-28 NOTE — Telephone Encounter (Signed)
Patient called and said [facility] hasn't heard anything from the office about the refill prescriptions.  After reading the previous notes it looks like there was a mix-up in communication.  The CMA was under the impression the patient was having the prescriptions pulled from [facility], but the patient was under the impression we were going to send the prescriptions to [facility].  I explained this to the patient and he wants the following prescriptions sent to his new pharmacy, which is [facility]. Refills on 1) Amlodipine  2) Simvastatin  3) Valsartan - hydrochlorothiazide - all at a 90day supply.  Please send to this new pharmacy: [facility] [place], [place] [address] - [phone]

## 2017-01-28 NOTE — Telephone Encounter (Signed)
Rx's [facility].

## 2017-03-01 DIAGNOSIS — E119 Type 2 diabetes mellitus without complications: Secondary | ICD-10-CM | POA: Diagnosis not present

## 2017-03-01 LAB — HM DIABETES EYE EXAM

## 2017-03-25 DIAGNOSIS — H60331 Swimmer's ear, right ear: Secondary | ICD-10-CM | POA: Diagnosis not present

## 2017-03-25 DIAGNOSIS — H93291 Other abnormal auditory perceptions, right ear: Secondary | ICD-10-CM | POA: Diagnosis not present

## 2017-05-25 ENCOUNTER — Ambulatory Visit (INDEPENDENT_AMBULATORY_CARE_PROVIDER_SITE_OTHER): Payer: Medicare HMO | Admitting: Internal Medicine

## 2017-05-25 ENCOUNTER — Encounter: Payer: Self-pay | Admitting: Internal Medicine

## 2017-05-25 VITALS — BP 126/80 | HR 56 | Temp 97.8°F | Resp 16 | Ht 69.0 in | Wt 236.4 lb

## 2017-05-25 DIAGNOSIS — E119 Type 2 diabetes mellitus without complications: Secondary | ICD-10-CM

## 2017-05-25 DIAGNOSIS — M109 Gout, unspecified: Secondary | ICD-10-CM | POA: Diagnosis not present

## 2017-05-25 DIAGNOSIS — I10 Essential (primary) hypertension: Secondary | ICD-10-CM | POA: Diagnosis not present

## 2017-05-25 DIAGNOSIS — Z0100 Encounter for examination of eyes and vision without abnormal findings: Secondary | ICD-10-CM

## 2017-05-25 LAB — CBC WITH DIFFERENTIAL/PLATELET
BASOS ABS: 0 10*3/uL (ref 0.0–0.1)
Basophils Relative: 0.5 % (ref 0.0–3.0)
Eosinophils Absolute: 0.4 10*3/uL (ref 0.0–0.7)
Eosinophils Relative: 4.9 % (ref 0.0–5.0)
HEMATOCRIT: 45.6 % (ref 39.0–52.0)
HEMOGLOBIN: 16.1 g/dL (ref 13.0–17.0)
LYMPHS PCT: 29.5 % (ref 12.0–46.0)
Lymphs Abs: 2.3 10*3/uL (ref 0.7–4.0)
MCHC: 35.4 g/dL (ref 30.0–36.0)
MCV: 94 fl (ref 78.0–100.0)
MONOS PCT: 8.7 % (ref 3.0–12.0)
Monocytes Absolute: 0.7 10*3/uL (ref 0.1–1.0)
NEUTROS ABS: 4.3 10*3/uL (ref 1.4–7.7)
Neutrophils Relative %: 56.4 % (ref 43.0–77.0)
Platelets: 232 10*3/uL (ref 150.0–400.0)
RBC: 4.85 Mil/uL (ref 4.22–5.81)
RDW: 13.5 % (ref 11.5–15.5)
WBC: 7.7 10*3/uL (ref 4.0–10.5)

## 2017-05-25 LAB — BASIC METABOLIC PANEL
BUN: 22 mg/dL (ref 6–23)
CALCIUM: 9.9 mg/dL (ref 8.4–10.5)
CO2: 30 meq/L (ref 19–32)
Chloride: 99 mEq/L (ref 96–112)
Creatinine, Ser: 1.09 mg/dL (ref 0.40–1.50)
GFR: 71.23 mL/min (ref >60.00)
Glucose, Bld: 141 mg/dL — ABNORMAL HIGH (ref 70–99)
Potassium: 4.4 mEq/L (ref 3.5–5.1)
SODIUM: 136 meq/L (ref 135–145)

## 2017-05-25 LAB — HEMOGLOBIN A1C: Hgb A1c MFr Bld: 6.6 % — ABNORMAL HIGH (ref 4.6–6.5)

## 2017-05-25 MED ORDER — COLCHICINE 0.6 MG PO TABS: 0.6000 mg | ORAL_TABLET | Freq: Two times a day (BID) | ORAL | 6 refills | Status: DC | PRN

## 2017-05-25 NOTE — Patient Instructions (Signed)
GO TO THE LAB : Get the blood work     GO TO THE FRONT DESK Schedule your next appointment for a  Physical exam in 6 months   

## 2017-05-25 NOTE — Progress Notes (Signed)
Pre visit review using our clinic review tool, if applicable. No additional management support is needed unless otherwise documented below in the visit note. 

## 2017-05-25 NOTE — Assessment & Plan Note (Signed)
DM: Seems well controlled on metformin.  Check A1c and BMP.  Feet exam negative.  Reports eye exam -3 months ago HTN: Well-controlled on Diovan HCT and amlodipine.  Check a CBC. Gout: Colchicine is expensive, alternative?Marland Kitchen  Recommend to stay on colchicine, paper RX provided, recommend to check prices in different pharmacies. RTC 10-19 CPX

## 2017-05-25 NOTE — Progress Notes (Signed)
Subjective:    Patient ID: Jimmy Valentine, male    DOB: Dec 08, 1950, 67 y.o.   MRN: [id number]  DOS:  05/25/2017 Type of visit - description : Routine visit Interval history: Medications reviewed: Good compliance without apparent side effects History of gout, colchicine very expensive, alternatives? Labs reviewed.   Review of Systems No lower extremity paresthesias Vision is good, had an eye check 3 months ago. Denies chest pain, difficulty breathing or lower extremity edema. Doing well emotionally, lost his wife several months ago, has 2 sisters in [place] and more family around. Past Medical History:  Diagnosis Date  . Arthritis   . Elevated LFTs   . Erectile dysfunction   . Gout 09/03/2011   RF colchicine    . Hyperlipidemia 1990s  . Hypertension 1980s  . 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 Sleep apnea    does't use cpap  . Sudden hearing loss, left 2017   idiopathic sensorineural hearing loss    Past Surgical History:  Procedure Laterality Date  . TOTAL KNEE ARTHROPLASTY Right 2005  . TOTAL KNEE ARTHROPLASTY  12/04/2011   LEFT-- TOTAL KNEE ARTHROPLASTY;  Surgeon: Sydnee Cabal, MD;  Location: [facility];  Service: Orthopedics;  Laterality: Left;    Social History   Socioeconomic History  . Marital status: Widowed    Spouse name: married  . Number of children: 0  . Years of education: Not on file  . Highest education level: Not on file  Occupational History  . Occupation: Biochemist, clinical  Social Needs  . Financial resource strain: Not on file  . Food insecurity:    Worry: Not on file    Inability: Not on file  . Transportation needs:    Medical: Not on file    Non-medical: Not on file  Tobacco Use  . Smoking status: Former Smoker    Packs/day: 1.00    Years: 15.00    Pack years: 15.00    Types: Cigarettes    Last attempt to quit: 12/04/1995    Years since quitting: 21.4  . Smokeless  tobacco: Never Used  Substance and Sexual Activity  . Alcohol use: Yes    Alcohol/week: 6.0 oz    Types: 12 Standard drinks or equivalent per week    Comment: social drinker  . Drug use: No  . Sexual activity: Not Currently  Lifestyle  . Physical activity:    Days per week: Not on file    Minutes per session: Not on file  . Stress: Not on file  Relationships  . Social connections:    Talks on phone: Not on file    Gets together: Not on file    Attends religious service: Not on file    Active member of club or organization: Not on file    Attends meetings of clubs or organizations: Not on file    Relationship status: Not on file  . Intimate partner violence:    Fear of current or ex partner: Not on file    Emotionally abused: Not on file    Physically abused: Not on file    Forced sexual activity: Not on file  Other Topics Concern  . Not on file  Social History Narrative   Lost first wife remotely.   Widow, lost 2nd wife 08-03-16   Lives alone, no biological children    2 step children from second wife , still very close to them  Allergies as of 05/25/2017   No Known Allergies     Medication List        Accurate as of 05/25/17  3:34 PM. Always use your most recent med list.          amLODipine 10 MG tablet Commonly known as:  NORVASC Take 1 tablet (10 mg total) by mouth daily.   aspirin 81 MG tablet Take 81 mg by mouth daily.   bisacodyl 5 MG EC tablet Commonly known as:  DULCOLAX Take 5 mg by mouth once. For colon 5-20   colchicine 0.6 MG tablet Take 1 tablet (0.6 mg total) by mouth 2 (two) times daily as needed.   metFORMIN 850 MG tablet Commonly known as:  GLUCOPHAGE Take 1 tablet (850 mg total) by mouth 2 (two) times daily with a meal.   sildenafil 20 MG tablet Commonly known as:  REVATIO Take 3-4 tablets (60-80 mg total) by mouth daily as needed.   simvastatin 40 MG tablet Commonly known as:  ZOCOR Take 1 tablet (40 mg total) by mouth at  bedtime.   valsartan-hydrochlorothiazide 320-25 MG tablet Commonly known as:  DIOVAN-HCT Take 1 tablet by mouth daily.          Objective:   Physical Exam BP 126/80 (BP Location: Left Arm, Patient Position: Sitting, Cuff Size: Normal)   Pulse (!) 56   Temp 97.8 F (36.6 C) (Oral)   Resp 16   Ht 5\' 9"  (1.753 m)   Wt 236 lb 6 oz (107.2 kg)   SpO2 97%   BMI 34.91 kg/m  General:   Well developed, well nourished . NAD.  HEENT:  Normocephalic . Face symmetric, atraumatic Lungs:  CTA B Normal respiratory effort, no intercostal retractions, no accessory muscle use. Heart: RRR,  no murmur.  No pretibial edema bilaterally  Skin: Not pale. Not jaundice DIABETIC FEET EXAM: No lower extremity edema Normal pedal pulses bilaterally Skin normal, nails normal, no calluses Pinprick examination of the feet normal.  Neurologic:  alert & oriented X3.  Speech normal, gait appropriate for age and unassisted Psych--  Cognition and judgment appear intact.  Cooperative with normal attention span and concentration.  Behavior appropriate. No anxious or depressed appearing.      Assessment & Plan:     Assessment DM, Started metformin 04-2015 HTN Hyperlipidemia Hypogonadism Dx few years ago by endocrinology, secondary hypogonadism. Was rx testosterone, did not notice any subjective difference while on meds. Currently on no HRT. DEXA 2014 normal  Erectile dysfunction Sleep apnea, mild, saw pulm 2014, CPAP was not mandatory, mostly to increase his QOL (see pulm note) DJD Gout Elevated LFTs x years, previous w/u (-) per pt ; hep B and C (-) 10-2014 Sees dermatology  PLAN: DM: Seems well controlled on metformin.  Check A1c and BMP.  Feet exam negative.  Reports eye exam -3 months ago HTN: Well-controlled on Diovan HCT and amlodipine.  Check a CBC. Gout: Colchicine is expensive, alternative?Marland Kitchen  Recommend to stay on colchicine, paper RX provided, recommend to check prices in different  pharmacies. RTC 10-19 CPX

## 2017-06-07 DIAGNOSIS — H60332 Swimmer's ear, left ear: Secondary | ICD-10-CM | POA: Diagnosis not present

## 2017-06-08 ENCOUNTER — Telehealth: Payer: Self-pay

## 2017-06-08 NOTE — Telephone Encounter (Signed)
PA denied. Medicare Part D rules do not allow coverage of drugs being used for erectile and sexual dysfunction or decreased libido (sex drive).

## 2017-06-08 NOTE — Telephone Encounter (Signed)
PA initiated via Covermymeds; KEY: [id number]. Awaiting determination.

## 2017-06-10 DIAGNOSIS — H60332 Swimmer's ear, left ear: Secondary | ICD-10-CM | POA: Diagnosis not present

## 2017-07-24 ENCOUNTER — Other Ambulatory Visit: Payer: Self-pay | Admitting: Internal Medicine

## 2017-08-27 ENCOUNTER — Encounter: Payer: Self-pay | Admitting: Internal Medicine

## 2017-10-19 ENCOUNTER — Other Ambulatory Visit: Payer: Self-pay | Admitting: Internal Medicine

## 2017-10-20 ENCOUNTER — Other Ambulatory Visit: Payer: Self-pay | Admitting: Internal Medicine

## 2017-10-25 DIAGNOSIS — M9903 Segmental and somatic dysfunction of lumbar region: Secondary | ICD-10-CM | POA: Diagnosis not present

## 2017-10-25 DIAGNOSIS — M5432 Sciatica, left side: Secondary | ICD-10-CM | POA: Diagnosis not present

## 2017-10-25 DIAGNOSIS — M546 Pain in thoracic spine: Secondary | ICD-10-CM | POA: Diagnosis not present

## 2017-10-26 DIAGNOSIS — M5432 Sciatica, left side: Secondary | ICD-10-CM | POA: Diagnosis not present

## 2017-10-26 DIAGNOSIS — M546 Pain in thoracic spine: Secondary | ICD-10-CM | POA: Diagnosis not present

## 2017-10-26 DIAGNOSIS — M9903 Segmental and somatic dysfunction of lumbar region: Secondary | ICD-10-CM | POA: Diagnosis not present

## 2017-10-28 DIAGNOSIS — M9903 Segmental and somatic dysfunction of lumbar region: Secondary | ICD-10-CM | POA: Diagnosis not present

## 2017-10-28 DIAGNOSIS — M5432 Sciatica, left side: Secondary | ICD-10-CM | POA: Diagnosis not present

## 2017-10-28 DIAGNOSIS — M546 Pain in thoracic spine: Secondary | ICD-10-CM | POA: Diagnosis not present

## 2017-11-02 DIAGNOSIS — M546 Pain in thoracic spine: Secondary | ICD-10-CM | POA: Diagnosis not present

## 2017-11-02 DIAGNOSIS — M5432 Sciatica, left side: Secondary | ICD-10-CM | POA: Diagnosis not present

## 2017-11-02 DIAGNOSIS — M9903 Segmental and somatic dysfunction of lumbar region: Secondary | ICD-10-CM | POA: Diagnosis not present

## 2017-11-04 DIAGNOSIS — M5432 Sciatica, left side: Secondary | ICD-10-CM | POA: Diagnosis not present

## 2017-11-04 DIAGNOSIS — M546 Pain in thoracic spine: Secondary | ICD-10-CM | POA: Diagnosis not present

## 2017-11-04 DIAGNOSIS — M9903 Segmental and somatic dysfunction of lumbar region: Secondary | ICD-10-CM | POA: Diagnosis not present

## 2017-11-04 DIAGNOSIS — R69 Illness, unspecified: Secondary | ICD-10-CM | POA: Diagnosis not present

## 2017-11-08 DIAGNOSIS — M546 Pain in thoracic spine: Secondary | ICD-10-CM | POA: Diagnosis not present

## 2017-11-08 DIAGNOSIS — M5432 Sciatica, left side: Secondary | ICD-10-CM | POA: Diagnosis not present

## 2017-11-08 DIAGNOSIS — M9903 Segmental and somatic dysfunction of lumbar region: Secondary | ICD-10-CM | POA: Diagnosis not present

## 2017-11-10 DIAGNOSIS — M5432 Sciatica, left side: Secondary | ICD-10-CM | POA: Diagnosis not present

## 2017-11-10 DIAGNOSIS — M546 Pain in thoracic spine: Secondary | ICD-10-CM | POA: Diagnosis not present

## 2017-11-10 DIAGNOSIS — M9903 Segmental and somatic dysfunction of lumbar region: Secondary | ICD-10-CM | POA: Diagnosis not present

## 2017-11-24 DIAGNOSIS — M546 Pain in thoracic spine: Secondary | ICD-10-CM | POA: Diagnosis not present

## 2017-11-24 DIAGNOSIS — M5432 Sciatica, left side: Secondary | ICD-10-CM | POA: Diagnosis not present

## 2017-11-24 DIAGNOSIS — M9903 Segmental and somatic dysfunction of lumbar region: Secondary | ICD-10-CM | POA: Diagnosis not present

## 2017-11-26 ENCOUNTER — Encounter: Payer: Medicare HMO | Admitting: Internal Medicine

## 2017-11-29 ENCOUNTER — Telehealth: Payer: Self-pay

## 2017-11-29 DIAGNOSIS — M5432 Sciatica, left side: Secondary | ICD-10-CM | POA: Diagnosis not present

## 2017-11-29 DIAGNOSIS — M546 Pain in thoracic spine: Secondary | ICD-10-CM | POA: Diagnosis not present

## 2017-11-29 DIAGNOSIS — M9903 Segmental and somatic dysfunction of lumbar region: Secondary | ICD-10-CM | POA: Diagnosis not present

## 2017-11-29 NOTE — Telephone Encounter (Signed)
PA denied because Medicare Part D does not cover medications for use of ED, sexual dysfunction, or decrease libido.

## 2017-11-29 NOTE — Telephone Encounter (Signed)
PA denied.   PA Case: [id number], Status: Denied. Questions? Contact [phone].

## 2017-11-29 NOTE — Telephone Encounter (Signed)
PA initiated via Covermymeds; KEY: [id number]. Awaiting determination.

## 2017-12-01 ENCOUNTER — Other Ambulatory Visit: Payer: Self-pay | Admitting: Internal Medicine

## 2017-12-01 DIAGNOSIS — M5432 Sciatica, left side: Secondary | ICD-10-CM | POA: Diagnosis not present

## 2017-12-01 DIAGNOSIS — M9903 Segmental and somatic dysfunction of lumbar region: Secondary | ICD-10-CM | POA: Diagnosis not present

## 2017-12-01 DIAGNOSIS — M546 Pain in thoracic spine: Secondary | ICD-10-CM | POA: Diagnosis not present

## 2017-12-06 ENCOUNTER — Encounter: Payer: Self-pay | Admitting: Internal Medicine

## 2017-12-06 ENCOUNTER — Ambulatory Visit (INDEPENDENT_AMBULATORY_CARE_PROVIDER_SITE_OTHER): Payer: Medicare HMO | Admitting: Internal Medicine

## 2017-12-06 VITALS — BP 124/80 | HR 70 | Temp 98.2°F | Resp 16 | Ht 69.0 in | Wt 229.4 lb

## 2017-12-06 DIAGNOSIS — E119 Type 2 diabetes mellitus without complications: Secondary | ICD-10-CM

## 2017-12-06 DIAGNOSIS — I4510 Unspecified right bundle-branch block: Secondary | ICD-10-CM

## 2017-12-06 DIAGNOSIS — M546 Pain in thoracic spine: Secondary | ICD-10-CM | POA: Diagnosis not present

## 2017-12-06 DIAGNOSIS — R9431 Abnormal electrocardiogram [ECG] [EKG]: Secondary | ICD-10-CM

## 2017-12-06 DIAGNOSIS — Z0000 Encounter for general adult medical examination without abnormal findings: Secondary | ICD-10-CM

## 2017-12-06 DIAGNOSIS — M9903 Segmental and somatic dysfunction of lumbar region: Secondary | ICD-10-CM | POA: Diagnosis not present

## 2017-12-06 DIAGNOSIS — M5432 Sciatica, left side: Secondary | ICD-10-CM | POA: Diagnosis not present

## 2017-12-06 LAB — COMPREHENSIVE METABOLIC PANEL
ALBUMIN: 4.7 g/dL (ref 3.5–5.2)
ALK PHOS: 73 U/L (ref 39–117)
ALT: 66 U/L — ABNORMAL HIGH (ref 0–53)
AST: 43 U/L — ABNORMAL HIGH (ref 0–37)
BILIRUBIN TOTAL: 0.9 mg/dL (ref 0.2–1.2)
BUN: 16 mg/dL (ref 6–23)
CALCIUM: 9.8 mg/dL (ref 8.4–10.5)
CO2: 24 mEq/L (ref 19–32)
Chloride: 94 mEq/L — ABNORMAL LOW (ref 96–112)
Creatinine, Ser: 1.11 mg/dL (ref 0.40–1.50)
GFR: 69.64 mL/min (ref >60.00)
GLUCOSE: 116 mg/dL — AB (ref 70–99)
POTASSIUM: 4.1 meq/L (ref 3.5–5.1)
Sodium: 132 mEq/L — ABNORMAL LOW (ref 135–145)
TOTAL PROTEIN: 7.1 g/dL (ref 6.0–8.3)

## 2017-12-06 LAB — LIPID PANEL
CHOLESTEROL: 143 mg/dL (ref 0–200)
HDL: 44.1 mg/dL (ref >39.00)
LDL Cholesterol: 70 mg/dL (ref 0–99)
NONHDL: 98.72
Total CHOL/HDL Ratio: 3
Triglycerides: 144 mg/dL (ref 0.0–149.0)
VLDL: 28.8 mg/dL (ref 0.0–40.0)

## 2017-12-06 LAB — HEMOGLOBIN A1C: Hgb A1c MFr Bld: 6.4 % (ref 4.6–6.5)

## 2017-12-06 LAB — PSA: PSA: 2.3 ng/mL (ref 0.10–4.00)

## 2017-12-06 LAB — TSH: TSH: 2.18 u[IU]/mL (ref 0.35–4.50)

## 2017-12-06 NOTE — Progress Notes (Signed)
Subjective:    Patient ID: Jimmy Valentine, male    DOB: 04-Nov-1950, 67 y.o.   MRN: [id number]  DOS:  12/06/2017 Type of visit - description : cpx Interval history: Since the last office he said  is doing well.   Review of Systems Continue with occasional difficulty with erections Libido is normal Minimal snoring, denies any unusual lack of energy or feeling sleepy. Emotionally doing well.  Other than above, a 14 point review of systems is negative    Past Medical History:  Diagnosis Date  . Arthritis   . Elevated LFTs   . Erectile dysfunction   . Gout 09/03/2011   RF colchicine    . Hyperlipidemia 1990s  . Hypertension 1980s  . 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 Sleep apnea    does't use cpap  . Sudden hearing loss, left 2017   idiopathic sensorineural hearing loss    Past Surgical History:  Procedure Laterality Date  . TOTAL KNEE ARTHROPLASTY Right 2005  . TOTAL KNEE ARTHROPLASTY  12/04/2011   LEFT-- TOTAL KNEE ARTHROPLASTY;  Surgeon: Sydnee Cabal, MD;  Location: [facility];  Service: Orthopedics;  Laterality: Left;    Social History   Socioeconomic History  . Marital status: Widowed    Spouse name: married  . Number of children: 0  . Years of education: Not on file  . Highest education level: Not on file  Occupational History  . Occupation: Biochemist, clinical  Social Needs  . Financial resource strain: Not on file  . Food insecurity:    Worry: Not on file    Inability: Not on file  . Transportation needs:    Medical: Not on file    Non-medical: Not on file  Tobacco Use  . Smoking status: Former Smoker    Packs/day: 1.00    Years: 15.00    Pack years: 15.00    Types: Cigarettes    Last attempt to quit: 12/04/1995    Years since quitting: 22.0  . Smokeless tobacco: Never Used  Substance and Sexual Activity  . Alcohol use: Yes    Alcohol/week: 12.0 standard drinks    Types: 12 Standard  drinks or equivalent per week    Comment: social drinker  . Drug use: No  . Sexual activity: Not Currently  Lifestyle  . Physical activity:    Days per week: Not on file    Minutes per session: Not on file  . Stress: Not on file  Relationships  . Social connections:    Talks on phone: Not on file    Gets together: Not on file    Attends religious service: Not on file    Active member of club or organization: Not on file    Attends meetings of clubs or organizations: Not on file    Relationship status: Not on file  . Intimate partner violence:    Fear of current or ex partner: Not on file    Emotionally abused: Not on file    Physically abused: Not on file    Forced sexual activity: Not on file  Other Topics Concern  . Not on file  Social History Narrative   Lost first wife remotely.   Widow, lost 2nd wife 08-03-16   Lives alone, no biological children    2 step children from second wife , still very close to them        Family History  Problem Relation  Age of Onset  . Hypertension Mother   . Coronary artery disease Mother        M CABG at age 64s  . Alzheimer's disease Mother   . Diabetes Sister   . Colon cancer Neg Hx   . Prostate cancer Neg Hx   . Stomach cancer Neg Hx   . Rectal cancer Neg Hx      Allergies as of 12/06/2017   No Known Allergies     Medication List        Accurate as of 12/06/17  4:44 PM. Always use your most recent med list.          amLODipine 10 MG tablet Commonly known as:  NORVASC Take 1 tablet (10 mg total) by mouth daily.   aspirin 81 MG tablet Take 81 mg by mouth daily.   bisacodyl 5 MG EC tablet Commonly known as:  DULCOLAX Take 5 mg by mouth once. For colon 5-20   colchicine 0.6 MG tablet Take 1 tablet (0.6 mg total) by mouth 2 (two) times daily as needed.   metFORMIN 850 MG tablet Commonly known as:  GLUCOPHAGE Take 1 tablet (850 mg total) by mouth 2 (two) times daily with a meal.   sildenafil 20 MG tablet Commonly  known as:  REVATIO TAKE 3 TO 4 TABLETS BY MOUTH EVERY DAY AS NEEDED   simvastatin 40 MG tablet Commonly known as:  ZOCOR Take 1 tablet (40 mg total) by mouth at bedtime.   valsartan-hydrochlorothiazide 320-25 MG tablet Commonly known as:  DIOVAN-HCT Take 1 tablet by mouth daily.          Objective:   Physical Exam BP 124/80 (BP Location: Right Arm, Patient Position: Sitting, Cuff Size: Normal)   Pulse 70   Temp 98.2 F (36.8 C) (Oral)   Resp 16   Ht 5\' 9"  (1.753 m)   Wt 229 lb 6 oz (104 kg)   SpO2 97%   BMI 33.87 kg/m   General: Well developed, NAD, BMI noted Neck: No  thyromegaly  HEENT:  Normocephalic . Face symmetric, atraumatic Lungs:  CTA B Normal respiratory effort, no intercostal retractions, no accessory muscle use. Heart: RRR,  no murmur.  No pretibial edema bilaterally  Abdomen:  Not distended, soft, non-tender. No rebound or rigidity.   Skin: Exposed areas without rash. Not pale. Not jaundice Rectal: External abnormalities: skin tags. Normal sphincter tone. No rectal masses or tenderness.  Brown stools Prostate: Prostate gland firm and smooth, mild  enlargement, no nodularity, tenderness, mass, asymmetry or induration Neurologic:  alert & oriented X3.  Speech normal, gait appropriate for age and unassisted Strength symmetric and appropriate for age.  Psych: Cognition and judgment appear intact.  Cooperative with normal attention span and concentration.  Behavior appropriate. No anxious or depressed appearing.     Assessment & Plan:   Assessment DM, Started metformin 04-2015 HTN Hyperlipidemia Hypogonadism Dx few years ago by endocrinology, secondary hypogonadism. Was rx testosterone, did not notice any subjective difference while on meds. Currently on no HRT. DEXA 2014 normal  Erectile dysfunction Sleep apnea, mild, saw pulm 2014, CPAP was not mandatory, mostly to increase his QOL (see pulm note) DJD Gout Elevated LFTs x years, previous  w/u (-) per pt ; hep B and C (-) 10-2014 Sees dermatology  PLAN: DM: On metformin, check a A1c. HTN: Continue amlodipine, Diovan HCT, checking labs High cholesterol: On simvastatin, checking labs Hypogonadism: Remote history of, currently with no apparent problems other than occasional  ED. Mild sleep apnea: DX 2014, denies symptoms, see ROS Gout: Very seldom has any symptoms.  On colchicine as needed ED : RF meds prn Social: Lost his second wife more than a year ago, fortunately doing well emotionally, keeps himself busy, still works. RTC 6 months

## 2017-12-06 NOTE — Progress Notes (Signed)
Pre visit review using our clinic review tool, if applicable. No additional management support is needed unless otherwise documented below in the visit note. 

## 2017-12-06 NOTE — Assessment & Plan Note (Signed)
DM: On metformin, check a A1c. HTN: Continue amlodipine, Diovan HCT, checking labs High cholesterol: On simvastatin, checking labs Hypogonadism: Remote history of, currently with no apparent problems other than occasional ED. Mild sleep apnea: DX 2014, denies symptoms, see ROS Gout: Very seldom has any symptoms.  On colchicine as needed ED : RF meds prn Social: Lost his second wife more than a year ago, fortunately doing well emotionally, keeps himself busy, still works. RTC 6 months

## 2017-12-06 NOTE — Assessment & Plan Note (Addendum)
-  TD 2014;   shingles shot 2012  ; pneumonia shot 21-- 04/21/13; prevnar 9/11/ 2015; shingrix x 2 2019; had a  flu shot   -[facility]: Cscope at age 67, normal, Dr Cher Nakai, Cscope 2016 normal, Dr Hilarie Fredrickson, 10 years -prostate ca screening: DRE slt enlarged glans, check a  PSA  -Labs: CMP, FLP, A1c, TSH, PSA EKG today: RBBB, slt different than previous EKG; asymptomatic, check a echo   -Diet-exercise:  Discussed, doing well.

## 2017-12-06 NOTE — Patient Instructions (Addendum)
GO TO THE LAB : Get the blood work     GO TO THE FRONT DESK Schedule your next appointment for a  Check up in 6 months   

## 2017-12-08 DIAGNOSIS — M5432 Sciatica, left side: Secondary | ICD-10-CM | POA: Diagnosis not present

## 2017-12-08 DIAGNOSIS — M9903 Segmental and somatic dysfunction of lumbar region: Secondary | ICD-10-CM | POA: Diagnosis not present

## 2017-12-08 DIAGNOSIS — M546 Pain in thoracic spine: Secondary | ICD-10-CM | POA: Diagnosis not present

## 2017-12-09 ENCOUNTER — Ambulatory Visit (HOSPITAL_COMMUNITY): Payer: Medicare HMO | Attending: Cardiovascular Disease

## 2017-12-09 DIAGNOSIS — R9431 Abnormal electrocardiogram [ECG] [EKG]: Secondary | ICD-10-CM | POA: Diagnosis not present

## 2017-12-09 DIAGNOSIS — I4510 Unspecified right bundle-branch block: Secondary | ICD-10-CM | POA: Diagnosis not present

## 2017-12-13 DIAGNOSIS — H938X2 Other specified disorders of left ear: Secondary | ICD-10-CM | POA: Diagnosis not present

## 2017-12-13 DIAGNOSIS — H60391 Other infective otitis externa, right ear: Secondary | ICD-10-CM | POA: Diagnosis not present

## 2018-01-14 ENCOUNTER — Other Ambulatory Visit: Payer: Self-pay | Admitting: Internal Medicine

## 2018-02-08 ENCOUNTER — Telehealth: Payer: Self-pay

## 2018-02-08 MED ORDER — VALSARTAN 320 MG PO TABS: 320.0000 mg | ORAL_TABLET | Freq: Every day | ORAL | 1 refills | Status: DC

## 2018-02-08 MED ORDER — HYDROCHLOROTHIAZIDE 25 MG PO TABS: 25.0000 mg | ORAL_TABLET | Freq: Every day | ORAL | 1 refills | Status: DC

## 2018-02-08 NOTE — Telephone Encounter (Signed)
Copied from [place] [id number]. Topic: General - Inquiry >> Feb 08, 2018 10:31 AM Windy Kalata wrote: Reason for CRM: Patient called and stated that his valsartan-hydrochlorothiazide (DIOVAN-HCT) 320-25 MG tablet is out of stock at pharmacy and he has checked with other pharmacies as well and they are not sure when they will be getting it in, he has 5/6 tablets left then he will be out. Please advise as to what else can be prescribed in it's place   Best call back is [phone] cell

## 2018-02-08 NOTE — Telephone Encounter (Signed)
Most "combo" meds are currently back ordered. Will send separate rx's for valsartan and HCTZ to his pharmacy.

## 2018-03-07 DIAGNOSIS — H25013 Cortical age-related cataract, bilateral: Secondary | ICD-10-CM | POA: Diagnosis not present

## 2018-03-07 DIAGNOSIS — H2513 Age-related nuclear cataract, bilateral: Secondary | ICD-10-CM | POA: Diagnosis not present

## 2018-03-07 DIAGNOSIS — E119 Type 2 diabetes mellitus without complications: Secondary | ICD-10-CM | POA: Diagnosis not present

## 2018-03-07 LAB — HM DIABETES EYE EXAM

## 2018-04-08 ENCOUNTER — Telehealth: Payer: Self-pay | Admitting: *Deleted

## 2018-04-08 NOTE — Telephone Encounter (Signed)
Received Diabetic Eye Exam Report from [facility] forwarded to provider/SLS 03/06

## 2018-04-11 ENCOUNTER — Ambulatory Visit (INDEPENDENT_AMBULATORY_CARE_PROVIDER_SITE_OTHER): Payer: Medicare HMO | Admitting: Internal Medicine

## 2018-04-11 ENCOUNTER — Encounter: Payer: Self-pay | Admitting: Internal Medicine

## 2018-04-11 VITALS — BP 122/72 | HR 70 | Temp 97.9°F | Resp 16 | Ht 69.0 in | Wt 234.2 lb

## 2018-04-11 DIAGNOSIS — R21 Rash and other nonspecific skin eruption: Secondary | ICD-10-CM | POA: Diagnosis not present

## 2018-04-11 MED ORDER — DOXYCYCLINE HYCLATE 100 MG PO TABS: 100.0000 mg | ORAL_TABLET | Freq: Two times a day (BID) | ORAL | 0 refills | Status: DC

## 2018-04-11 MED ORDER — KETOCONAZOLE 2 % EX CREA: 1.0000 "application " | TOPICAL_CREAM | Freq: Every day | CUTANEOUS | 0 refills | Status: DC

## 2018-04-11 NOTE — Patient Instructions (Signed)
Please apply the cream twice a day  Once you are better, keep the area dry with powder or talk  Take antibiotic called doxycycline, I suspect you also have a staph infection  Call if not gradually better  Call if worse

## 2018-04-11 NOTE — Progress Notes (Signed)
Subjective:    Patient ID: Jimmy Valentine, male    DOB: [birth year], 68 y.o.   MRN: [id number]  DOS:  04/11/2018 Type of visit - description: Acute Developed a rash about 8 days ago, in previous occasions he uses [employer] talc and the problem resolved within a couple of days but this time it is persistent. Area is mildly itchy, no blisters  Review of Systems Denies fever and chills  Past Medical History:  Diagnosis Date  . Arthritis   . Elevated LFTs   . Erectile dysfunction   . Gout 09/03/2011   RF colchicine    . Hyperlipidemia 1990s  . Hypertension 1980s  . 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 Sleep apnea    does't use cpap  . Sudden hearing loss, left 2017   idiopathic sensorineural hearing loss    Past Surgical History:  Procedure Laterality Date  . TOTAL KNEE ARTHROPLASTY Right 2005  . TOTAL KNEE ARTHROPLASTY  12/04/2011   LEFT-- TOTAL KNEE ARTHROPLASTY;  Surgeon: Sydnee Cabal, MD;  Location: [facility];  Service: Orthopedics;  Laterality: Left;    Social History   Socioeconomic History  . Marital status: Widowed    Spouse name: married  . Number of children: 0  . Years of education: Not on file  . Highest education level: Not on file  Occupational History  . Occupation: Biochemist, clinical  Social Needs  . Financial resource strain: Not on file  . Food insecurity:    Worry: Not on file    Inability: Not on file  . Transportation needs:    Medical: Not on file    Non-medical: Not on file  Tobacco Use  . Smoking status: Former Smoker    Packs/day: 1.00    Years: 15.00    Pack years: 15.00    Types: Cigarettes    Last attempt to quit: 12/04/1995    Years since quitting: 22.3  . Smokeless tobacco: Never Used  Substance and Sexual Activity  . Alcohol use: Yes    Alcohol/week: 12.0 standard drinks    Types: 12 Standard drinks or equivalent per week    Comment: social drinker  . Drug use: No  .  Sexual activity: Not Currently  Lifestyle  . Physical activity:    Days per week: Not on file    Minutes per session: Not on file  . Stress: Not on file  Relationships  . Social connections:    Talks on phone: Not on file    Gets together: Not on file    Attends religious service: Not on file    Active member of club or organization: Not on file    Attends meetings of clubs or organizations: Not on file    Relationship status: Not on file  . Intimate partner violence:    Fear of current or ex partner: Not on file    Emotionally abused: Not on file    Physically abused: Not on file    Forced sexual activity: Not on file  Other Topics Concern  . Not on file  Social History Narrative   Lost first wife remotely.   Widow, lost 2nd wife 08-03-16   Lives alone, no biological children    2 step children from second wife , still very close to them         Allergies as of 04/11/2018   No Known Allergies     Medication List  Accurate as of April 11, 2018  1:58 PM. Always use your most recent med list.        amLODipine 10 MG tablet Commonly known as:  NORVASC Take 1 tablet (10 mg total) by mouth daily.   aspirin 81 MG tablet Take 81 mg by mouth daily.   bisacodyl 5 MG EC tablet Commonly known as:  DULCOLAX Take 5 mg by mouth once. For colon 5-20   colchicine 0.6 MG tablet Take 1 tablet (0.6 mg total) by mouth 2 (two) times daily as needed.   hydrochlorothiazide 25 MG tablet Commonly known as:  HYDRODIURIL Take 1 tablet (25 mg total) by mouth daily.   metFORMIN 850 MG tablet Commonly known as:  GLUCOPHAGE Take 1 tablet (850 mg total) by mouth 2 (two) times daily with a meal.   sildenafil 20 MG tablet Commonly known as:  REVATIO TAKE 3 TO 4 TABLETS BY MOUTH EVERY DAY AS NEEDED   simvastatin 40 MG tablet Commonly known as:  ZOCOR Take 1 tablet (40 mg total) by mouth at bedtime.   valsartan 320 MG tablet Commonly known as:  DIOVAN Take 1 tablet (320 mg total)  by mouth daily.           Objective:   Physical Exam Genitourinary:     BP 122/72 (BP Location: Left Arm, Patient Position: Sitting, Cuff Size: Normal)   Pulse 70   Temp 97.9 F (36.6 C) (Oral)   Resp 16   Ht 5\' 9"  (1.753 m)   Wt 234 lb 4 oz (106.3 kg)   SpO2 96%   BMI 34.59 kg/m  General:   Well developed, NAD, BMI noted. HEENT:  Normocephalic . Face symmetric, atraumatic Skin: The right groin is normal The left groin has 2 different rashes:  The  small one (see graphic, orange color) seems to be well demarcated with a slightly more red borders.  Rash is wet but not oozing. The second rash is larger (see graphic, blue color) is soft, borders are not sharp, is slightly warm. Neurologic:  alert & oriented X3.  Speech normal, gait appropriate for age and unassisted Psych--  Cognition and judgment appear intact.  Cooperative with normal attention span and concentration.  Behavior appropriate. No anxious or depressed appearing.      Assessment     Assessment DM, Started metformin 04-2015 HTN Hyperlipidemia Hypogonadism Dx few years ago by endocrinology, secondary hypogonadism. Was rx testosterone, did not notice any subjective difference while on meds. Currently on no HRT. DEXA 2014 normal  Erectile dysfunction Sleep apnea, mild, saw pulm 2014, CPAP was not mandatory, mostly to increase his QOL (see pulm note) DJD Gout Elevated LFTs x years, previous w/u (-) per pt ; hep B and C (-) 10-2014 Sees dermatology  PLAN: Rash: Suspect a fungal infection with associated cellulitis. The scrotum and penis are not involved at this point. Recommend topical ketoconazole and doxycycline. Explained patient the risk of a much severe infection particularly because he has history of diabetes, he seems to understand and will communicate with me immediately if he is not improving.

## 2018-04-11 NOTE — Progress Notes (Signed)
Pre visit review using our clinic review tool, if applicable. No additional management support is needed unless otherwise documented below in the visit note. 

## 2018-04-12 ENCOUNTER — Encounter: Payer: Self-pay | Admitting: Internal Medicine

## 2018-04-12 NOTE — Assessment & Plan Note (Signed)
Rash: Suspect a fungal infection with associated cellulitis. The scrotum and penis are not involved at this point. Recommend topical ketoconazole and doxycycline. Explained patient the risk of a much severe infection particularly because he has history of diabetes, he seems to understand and will communicate with me immediately if he is not improving.

## 2018-04-15 ENCOUNTER — Other Ambulatory Visit: Payer: Self-pay | Admitting: Internal Medicine

## 2018-05-07 ENCOUNTER — Other Ambulatory Visit: Payer: Self-pay | Admitting: Internal Medicine

## 2018-06-06 ENCOUNTER — Ambulatory Visit (INDEPENDENT_AMBULATORY_CARE_PROVIDER_SITE_OTHER): Payer: Medicare HMO | Admitting: Internal Medicine

## 2018-06-06 ENCOUNTER — Other Ambulatory Visit: Payer: Self-pay

## 2018-06-06 DIAGNOSIS — I10 Essential (primary) hypertension: Secondary | ICD-10-CM

## 2018-06-06 DIAGNOSIS — E1169 Type 2 diabetes mellitus with other specified complication: Secondary | ICD-10-CM | POA: Diagnosis not present

## 2018-06-06 DIAGNOSIS — E785 Hyperlipidemia, unspecified: Secondary | ICD-10-CM | POA: Diagnosis not present

## 2018-06-06 NOTE — Progress Notes (Signed)
Subjective:    Patient ID: Jimmy Valentine, male    DOB: November 07, 1950, 68 y.o.   MRN: [id number]  DOS:  06/06/2018 Type of visit - description: Virtual Visit via Video Note  I connected with@ on 06/07/18 at  8:20 AM EDT by a video enabled telemedicine application and verified that I am speaking with the correct person using two identifiers.   THIS ENCOUNTER IS A VIRTUAL VISIT DUE TO COVID-19 - PATIENT WAS NOT SEEN IN THE OFFICE. PATIENT HAS CONSENTED TO VIRTUAL VISIT / TELEMEDICINE VISIT   Location of patient: home  Location of provider: office  I discussed the limitations of evaluation and management by telemedicine and the availability of in person appointments. The patient expressed understanding and agreed to proceed.  History of Present Illness: Routine office visit Was seen recently with a rash: Resolved High cholesterol, on simvastatin, concern about the dose Gout: No recent events DM: Good med compliance, no recent ambulatory CBGs HTN: Ambulatory BPs 130/78     Review of Systems Denies fever chills or cough No chest pain no difficulty breathing Following good precautions regards COVID-19  Past Medical History:  Diagnosis Date  . Arthritis   . Elevated LFTs   . Erectile dysfunction   . Gout 09/03/2011   RF colchicine    . Hyperlipidemia 1990s  . Hypertension 1980s  . 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 Sleep apnea    does't use cpap  . Sudden hearing loss, left 2017   idiopathic sensorineural hearing loss    Past Surgical History:  Procedure Laterality Date  . TOTAL KNEE ARTHROPLASTY Right 2005  . TOTAL KNEE ARTHROPLASTY  12/04/2011   LEFT-- TOTAL KNEE ARTHROPLASTY;  Surgeon: Sydnee Cabal, MD;  Location: [facility];  Service: Orthopedics;  Laterality: Left;    Social History   Socioeconomic History  . Marital status: Widowed    Spouse name: married  . Number of children: 0  . Years of  education: Not on file  . Highest education level: Not on file  Occupational History  . Occupation: Biochemist, clinical  Social Needs  . Financial resource strain: Not on file  . Food insecurity:    Worry: Not on file    Inability: Not on file  . Transportation needs:    Medical: Not on file    Non-medical: Not on file  Tobacco Use  . Smoking status: Former Smoker    Packs/day: 1.00    Years: 15.00    Pack years: 15.00    Types: Cigarettes    Last attempt to quit: 12/04/1995    Years since quitting: 22.5  . Smokeless tobacco: Never Used  Substance and Sexual Activity  . Alcohol use: Yes    Alcohol/week: 12.0 standard drinks    Types: 12 Standard drinks or equivalent per week    Comment: social drinker  . Drug use: No  . Sexual activity: Not Currently  Lifestyle  . Physical activity:    Days per week: Not on file    Minutes per session: Not on file  . Stress: Not on file  Relationships  . Social connections:    Talks on phone: Not on file    Gets together: Not on file    Attends religious service: Not on file    Active member of club or organization: Not on file    Attends meetings of clubs or organizations: Not on file    Relationship  status: Not on file  . Intimate partner violence:    Fear of current or ex partner: Not on file    Emotionally abused: Not on file    Physically abused: Not on file    Forced sexual activity: Not on file  Other Topics Concern  . Not on file  Social History Narrative   Lost first wife remotely.   Widow, lost 2nd wife 08-03-16   Lives alone, no biological children    2 step children from second wife , still very close to them         Allergies as of 06/06/2018   No Known Allergies     Medication List       Accurate as of Jun 06, 2018 11:59 PM. Always use your most recent med list.        amLODipine 10 MG tablet Commonly known as:  NORVASC Take 1 tablet (10 mg total) by mouth daily.   aspirin 81 MG tablet Take 81 mg by mouth daily.    bisacodyl 5 MG EC tablet Commonly known as:  DULCOLAX Take 5 mg by mouth once. For colon 5-20   colchicine 0.6 MG tablet Take 1 tablet (0.6 mg total) by mouth 2 (two) times daily as needed.   hydrochlorothiazide 25 MG tablet Commonly known as:  HYDRODIURIL Take 1 tablet (25 mg total) by mouth daily.   metFORMIN 850 MG tablet Commonly known as:  GLUCOPHAGE Take 1 tablet (850 mg total) by mouth 2 (two) times daily with a meal.   sildenafil 20 MG tablet Commonly known as:  REVATIO TAKE 3 TO 4 TABLETS BY MOUTH EVERY DAY AS NEEDED   simvastatin 40 MG tablet Commonly known as:  ZOCOR Take 0.5 tablets (20 mg total) by mouth at bedtime.   valsartan 320 MG tablet Commonly known as:  DIOVAN Take 1 tablet (320 mg total) by mouth daily.           Objective:   Physical Exam There were no vitals taken for this visit. 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 Was rx testosterone, did not notice any subjective difference while on meds. Currently on no HRT. DEXA 2014 normal  Erectile dysfunction Sleep apnea, mild, saw pulm 2014, CPAP was not mandatory, mostly to increase his QOL (see pulm note) DJD Gout Elevated LFTs x years, previous w/u (-) per pt ; hep B and C (-) 10-2014 Sees dermatology  PLAN: DM: Currently on metformin.  Due for A1c HTN: Good ambulatory BPs, currently on amlodipine, HCTZ, Diovan.  Due for BMP Hyperlipidemia, on simvastatin 40 mg, got a letter from his insurance urging him to change to a lower dose.  He has been taking simvastatin for many years without apparent problems, he recall at some point having problems with Lipitor.  For now, we agreed to decrease simvastatin to half tablet daily and check labs in 6 weeks.  If he is not at  goal, will consider medications (Crestor?). Rash: See last visit, symptoms subsided quickly. Plan: Labs in 6 weeks: CMP, FLP, CBC, A1c CPX 12-2018

## 2018-06-07 NOTE — Assessment & Plan Note (Signed)
DM: Currently on metformin.  Due for A1c HTN: Good ambulatory BPs, currently on amlodipine, HCTZ, Diovan.  Due for BMP Hyperlipidemia, on simvastatin 40 mg, got a letter from his insurance urging him to change to a lower dose.  He has been taking simvastatin for many years without apparent problems, he recall at some point having problems with Lipitor.  For now, we agreed to decrease simvastatin to half tablet daily and check labs in 6 weeks.  If he is not at goal, will consider medications (Crestor?). Rash: See last visit, symptoms subsided quickly. Plan: Labs in 6 weeks: CMP, FLP, CBC, A1c CPX 12-2018

## 2018-07-13 ENCOUNTER — Other Ambulatory Visit: Payer: Self-pay | Admitting: Internal Medicine

## 2018-07-18 ENCOUNTER — Other Ambulatory Visit: Payer: Self-pay

## 2018-07-18 ENCOUNTER — Other Ambulatory Visit (INDEPENDENT_AMBULATORY_CARE_PROVIDER_SITE_OTHER): Payer: Medicare HMO

## 2018-07-18 DIAGNOSIS — I10 Essential (primary) hypertension: Secondary | ICD-10-CM | POA: Diagnosis not present

## 2018-07-18 DIAGNOSIS — E785 Hyperlipidemia, unspecified: Secondary | ICD-10-CM

## 2018-07-18 DIAGNOSIS — E1169 Type 2 diabetes mellitus with other specified complication: Secondary | ICD-10-CM

## 2018-07-18 LAB — CBC WITH DIFFERENTIAL/PLATELET
Basophils Absolute: 0 10*3/uL (ref 0.0–0.1)
Basophils Relative: 0.4 % (ref 0.0–3.0)
Eosinophils Absolute: 0.6 10*3/uL (ref 0.0–0.7)
Eosinophils Relative: 8 % — ABNORMAL HIGH (ref 0.0–5.0)
HCT: 46 % (ref 39.0–52.0)
Hemoglobin: 16.3 g/dL (ref 13.0–17.0)
Lymphocytes Relative: 34.1 % (ref 12.0–46.0)
Lymphs Abs: 2.7 10*3/uL (ref 0.7–4.0)
MCHC: 35.5 g/dL (ref 30.0–36.0)
MCV: 94.3 fl (ref 78.0–100.0)
Monocytes Absolute: 0.7 10*3/uL (ref 0.1–1.0)
Monocytes Relative: 8.4 % (ref 3.0–12.0)
Neutro Abs: 3.9 10*3/uL (ref 1.4–7.7)
Neutrophils Relative %: 49.1 % (ref 43.0–77.0)
Platelets: 271 10*3/uL (ref 150.0–400.0)
RBC: 4.87 Mil/uL (ref 4.22–5.81)
RDW: 13.2 % (ref 11.5–15.5)
WBC: 8 10*3/uL (ref 4.0–10.5)

## 2018-07-18 LAB — COMPREHENSIVE METABOLIC PANEL
ALT: 47 U/L (ref 0–53)
AST: 26 U/L (ref 0–37)
Albumin: 4.5 g/dL (ref 3.5–5.2)
Alkaline Phosphatase: 75 U/L (ref 39–117)
BUN: 17 mg/dL (ref 6–23)
CO2: 28 mEq/L (ref 19–32)
Calcium: 9.9 mg/dL (ref 8.4–10.5)
Chloride: 94 mEq/L — ABNORMAL LOW (ref 96–112)
Creatinine, Ser: 0.97 mg/dL (ref 0.40–1.50)
GFR: 76.42 mL/min (ref >60.00)
Glucose, Bld: 127 mg/dL — ABNORMAL HIGH (ref 70–99)
Potassium: 5.1 mEq/L (ref 3.5–5.1)
Sodium: 132 mEq/L — ABNORMAL LOW (ref 135–145)
Total Bilirubin: 0.8 mg/dL (ref 0.2–1.2)
Total Protein: 6.8 g/dL (ref 6.0–8.3)

## 2018-07-18 LAB — HEMOGLOBIN A1C: Hgb A1c MFr Bld: 6.7 % — ABNORMAL HIGH (ref 4.6–6.5)

## 2018-07-18 LAB — LIPID PANEL
Cholesterol: 158 mg/dL (ref 0–200)
HDL: 45.8 mg/dL (ref >39.00)
LDL Cholesterol: 82 mg/dL (ref 0–99)
NonHDL: 112.16
Total CHOL/HDL Ratio: 3
Triglycerides: 149 mg/dL (ref 0.0–149.0)
VLDL: 29.8 mg/dL (ref 0.0–40.0)

## 2018-07-30 ENCOUNTER — Other Ambulatory Visit: Payer: Self-pay | Admitting: Internal Medicine

## 2018-10-10 ENCOUNTER — Other Ambulatory Visit: Payer: Self-pay | Admitting: Internal Medicine

## 2018-11-03 ENCOUNTER — Encounter: Payer: Self-pay | Admitting: Internal Medicine

## 2018-11-03 ENCOUNTER — Other Ambulatory Visit: Payer: Self-pay

## 2018-11-03 ENCOUNTER — Ambulatory Visit (INDEPENDENT_AMBULATORY_CARE_PROVIDER_SITE_OTHER): Payer: Medicare HMO | Admitting: Internal Medicine

## 2018-11-03 VITALS — BP 149/75 | HR 81 | Temp 96.3°F | Resp 16 | Ht 69.0 in | Wt 232.1 lb

## 2018-11-03 DIAGNOSIS — S90812A Abrasion, left foot, initial encounter: Secondary | ICD-10-CM | POA: Diagnosis not present

## 2018-11-03 DIAGNOSIS — Z23 Encounter for immunization: Secondary | ICD-10-CM

## 2018-11-03 MED ORDER — SANTYL 250 UNIT/GM EX OINT: 1.0000 "application " | TOPICAL_OINTMENT | Freq: Every day | CUTANEOUS | 0 refills | Status: DC

## 2018-11-03 MED ORDER — DOXYCYCLINE HYCLATE 100 MG PO TABS: 100.0000 mg | ORAL_TABLET | Freq: Two times a day (BID) | ORAL | 0 refills | Status: DC

## 2018-11-03 NOTE — Patient Instructions (Signed)
Take antibiotic as prescribed  Elevate the leg  Wash with soap and water, put Santyl twice a day, keep clean and dry.  Leave it uncovered at night  Call immediately if you notice increased swelling, redness, fever, chills or if you see any discharge  Call if not gradually improving

## 2018-11-03 NOTE — Progress Notes (Signed)
Subjective:    Patient ID: Jimmy Valentine, male    DOB: 08-Oct-1950, 68 y.o.   MRN: [id number]  DOS:  11/03/2018 Type of visit - description: Acute 8 days ago, he [place]ed [place] the [place], took a step down and peeled the skin of the bottom of the left foot with a board. + Pain No history of puncture wound Some swelling.  Review of Systems No fever chills No discharge No redness Past Medical History:  Diagnosis Date  . Arthritis   . Elevated LFTs   . Erectile dysfunction   . Gout 09/03/2011   RF colchicine    . Hyperlipidemia 1990s  . Hypertension 1980s  . 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 Sleep apnea    does't use cpap  . Sudden hearing loss, left 2017   idiopathic sensorineural hearing loss    Past Surgical History:  Procedure Laterality Date  . TOTAL KNEE ARTHROPLASTY Right 2005  . TOTAL KNEE ARTHROPLASTY  12/04/2011   LEFT-- TOTAL KNEE ARTHROPLASTY;  Surgeon: Sydnee Cabal, MD;  Location: [facility];  Service: Orthopedics;  Laterality: Left;    Social History   Socioeconomic History  . Marital status: Widowed    Spouse name: married  . Number of children: 0  . Years of education: Not on file  . Highest education level: Not on file  Occupational History  . Occupation: Biochemist, clinical  Social Needs  . Financial resource strain: Not on file  . Food insecurity    Worry: Not on file    Inability: Not on file  . Transportation needs    Medical: Not on file    Non-medical: Not on file  Tobacco Use  . Smoking status: Former Smoker    Packs/day: 1.00    Years: 15.00    Pack years: 15.00    Types: Cigarettes    Quit date: 12/04/1995    Years since quitting: 22.9  . Smokeless tobacco: Never Used  Substance and Sexual Activity  . Alcohol use: Yes    Alcohol/week: 12.0 standard drinks    Types: 12 Standard drinks or equivalent per week    Comment: social drinker  . Drug use: No  . Sexual  activity: Not Currently  Lifestyle  . Physical activity    Days per week: Not on file    Minutes per session: Not on file  . Stress: Not on file  Relationships  . Social Herbalist on phone: Not on file    Gets together: Not on file    Attends religious service: Not on file    Active member of club or organization: Not on file    Attends meetings of clubs or organizations: Not on file    Relationship status: Not on file  . Intimate partner violence    Fear of current or ex partner: Not on file    Emotionally abused: Not on file    Physically abused: Not on file    Forced sexual activity: Not on file  Other Topics Concern  . Not on file  Social History Narrative   Lost first wife remotely.   Widow, lost 2nd wife 08-03-16   Lives alone, no biological children    2 step children from second wife , still very close to them         Allergies as of 11/03/2018   No Known Allergies     Medication  List       Accurate as of November 03, 2018 11:59 PM. If you have any questions, ask your nurse or doctor.        amLODipine 10 MG tablet Commonly known as: NORVASC Take 1 tablet (10 mg total) by mouth daily.   aspirin 81 MG tablet Take 81 mg by mouth daily.   bisacodyl 5 MG EC tablet Commonly known as: DULCOLAX Take 5 mg by mouth once. For colon 5-20   colchicine 0.6 MG tablet Take 1 tablet (0.6 mg total) by mouth 2 (two) times daily as needed.   doxycycline 100 MG tablet Commonly known as: VIBRA-TABS Take 1 tablet (100 mg total) by mouth 2 (two) times daily. Started by: Kathlene November, MD   hydrochlorothiazide 25 MG tablet Commonly known as: HYDRODIURIL Take 1 tablet (25 mg total) by mouth daily.   metFORMIN 850 MG tablet Commonly known as: GLUCOPHAGE Take 1 tablet (850 mg total) by mouth 2 (two) times daily with a meal.   Santyl ointment Generic drug: collagenase Apply 1 application topically daily. Started by: Kathlene November, MD   sildenafil 20 MG tablet  Commonly known as: REVATIO TAKE 3 TO 4 TABLETS BY MOUTH EVERY DAY AS NEEDED   simvastatin 40 MG tablet Commonly known as: ZOCOR Take 0.5 tablets (20 mg total) by mouth at bedtime.   valsartan 320 MG tablet Commonly known as: DIOVAN Take 1 tablet (320 mg total) by mouth daily.           Objective:   Physical Exam BP (!) 149/75 (BP Location: Left Arm, Patient Position: Sitting, Cuff Size: Normal)   Pulse 81   Temp (!) 96.3 F (35.7 C) (Temporal)   Resp 16   Ht 5\' 9"  (1.753 m)   Wt 232 lb 2 oz (105.3 kg)   BMI 34.28 kg/m  General:   Well developed, NAD, BMI noted. HEENT:  Normocephalic . Face symmetric, atraumatic Skin: Has a wound at the plantar area left foot, see picture.  I could not feel any foreign body, there is mild tenderness around the area but no warmness.  There is some clear discharge. The rest of the foot is essentially normal Neurologic:  alert & oriented X3.  Speech normal, gait appropriate for age and unassisted Psych--  Cognition and judgment appear intact.  Cooperative with normal attention span and concentration.  Behavior appropriate. No anxious or depressed appearing.        Assessment      Assessment DM, Started metformin 04-2015 HTN Hyperlipidemia Hypogonadism Dx few years ago by endocrinology, secondary hypogonadism. Was rx testosterone, did not notice any subjective difference while on meds. Currently on no HRT. DEXA 2014 normal  Erectile dysfunction Sleep apnea, mild, saw pulm 2014, CPAP was not mandatory, mostly to increase his QOL (see pulm note) DJD Gout Elevated LFTs x years, previous w/u (-) per pt ; hep B and C (-) 10-2014 Sees dermatology  PLAN: Open wound, left foot, patient has diabetes: No obvious abscess, foreign body or cellulitis. I will recommend leg elevation, Santyl, local care (see AVS), start doxycycline due to the high risk for infex Call promptly if he is not improving, see AVS.  Wound was redressed  Preventive care: Td today, flu shot today.

## 2018-11-03 NOTE — Progress Notes (Signed)
Pre visit review using our clinic review tool, if applicable. No additional management support is needed unless otherwise documented below in the visit note. 

## 2018-11-04 NOTE — Assessment & Plan Note (Addendum)
Open wound, left foot, patient has diabetes: No obvious abscess, foreign body or cellulitis. I will recommend leg elevation, Santyl, local care (see AVS), start doxycycline due to the high risk for infex Call promptly if he is not improving, see AVS. Wound was redressed Preventive care: Td today, flu shot today.

## 2018-12-07 ENCOUNTER — Other Ambulatory Visit: Payer: Self-pay

## 2018-12-08 ENCOUNTER — Ambulatory Visit (INDEPENDENT_AMBULATORY_CARE_PROVIDER_SITE_OTHER): Payer: Medicare HMO | Admitting: Internal Medicine

## 2018-12-08 ENCOUNTER — Encounter: Payer: Self-pay | Admitting: Internal Medicine

## 2018-12-08 ENCOUNTER — Other Ambulatory Visit: Payer: Self-pay

## 2018-12-08 VITALS — BP 147/69 | HR 67 | Temp 97.1°F | Resp 16 | Ht 69.0 in | Wt 228.5 lb

## 2018-12-08 DIAGNOSIS — E1169 Type 2 diabetes mellitus with other specified complication: Secondary | ICD-10-CM | POA: Diagnosis not present

## 2018-12-08 DIAGNOSIS — Z0000 Encounter for general adult medical examination without abnormal findings: Secondary | ICD-10-CM | POA: Diagnosis not present

## 2018-12-08 LAB — COMPREHENSIVE METABOLIC PANEL
ALT: 45 U/L (ref 0–53)
AST: 25 U/L (ref 0–37)
Albumin: 4.4 g/dL (ref 3.5–5.2)
Alkaline Phosphatase: 78 U/L (ref 39–117)
BUN: 17 mg/dL (ref 6–23)
CO2: 29 mEq/L (ref 19–32)
Calcium: 9.8 mg/dL (ref 8.4–10.5)
Chloride: 96 mEq/L (ref 96–112)
Creatinine, Ser: 1.08 mg/dL (ref 0.40–1.50)
GFR: 67.44 mL/min (ref >60.00)
Glucose, Bld: 148 mg/dL — ABNORMAL HIGH (ref 70–99)
Potassium: 5 mEq/L (ref 3.5–5.1)
Sodium: 133 mEq/L — ABNORMAL LOW (ref 135–145)
Total Bilirubin: 0.8 mg/dL (ref 0.2–1.2)
Total Protein: 6.9 g/dL (ref 6.0–8.3)

## 2018-12-08 LAB — LIPID PANEL
Cholesterol: 156 mg/dL (ref 0–200)
HDL: 43.8 mg/dL (ref >39.00)
LDL Cholesterol: 92 mg/dL (ref 0–99)
NonHDL: 112
Total CHOL/HDL Ratio: 4
Triglycerides: 100 mg/dL (ref 0.0–149.0)
VLDL: 20 mg/dL (ref 0.0–40.0)

## 2018-12-08 LAB — HEMOGLOBIN A1C: Hgb A1c MFr Bld: 6.3 % (ref 4.6–6.5)

## 2018-12-08 LAB — PSA: PSA: 0.45 ng/mL (ref 0.10–4.00)

## 2018-12-08 NOTE — Patient Instructions (Addendum)
Please schedule Medicare Wellness with Glenard Haring.   GO TO THE LAB : Get the blood work     GO TO THE FRONT DESK Schedule your next appointment   for a checkup in 6 months  Continue checking your blood pressures BP GOAL is between 110/65 and  135/85. If it is consistently higher or lower, let me know

## 2018-12-08 NOTE — Progress Notes (Signed)
Pre visit review using our clinic review tool, if applicable. No additional management support is needed unless otherwise documented below in the visit note. 

## 2018-12-08 NOTE — Progress Notes (Signed)
Subjective:    Patient ID: Jimmy Valentine, male    DOB: [birth year], 68 y.o.   MRN: [id number]  DOS:  12/08/2018 Type of visit - description: CPX No major concerns He remains active. Ambulatory blood pressures are very good.  No ambulatory CBGs. No recent gout episodes.   Review of Systems  See last visit, had wound, it healed very well.  Other than above, a 14 point review of systems is negative     Past Medical History:  Diagnosis Date  . Arthritis   . Elevated LFTs   . Erectile dysfunction   . Gout 09/03/2011   RF colchicine    . Hyperlipidemia 1990s  . Hypertension 1980s  . 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 Sleep apnea    does't use cpap  . Sudden hearing loss, left 2017   idiopathic sensorineural hearing loss    Past Surgical History:  Procedure Laterality Date  . TOTAL KNEE ARTHROPLASTY Right 2005  . TOTAL KNEE ARTHROPLASTY  12/04/2011   LEFT-- TOTAL KNEE ARTHROPLASTY;  Surgeon: Sydnee Cabal, MD;  Location: [facility];  Service: Orthopedics;  Laterality: Left;    Social History   Socioeconomic History  . Marital status: Widowed    Spouse name: married  . Number of children: 0  . Years of education: Not on file  . Highest education level: Not on file  Occupational History  . Occupation: Biochemist, clinical, still works   Scientific laboratory technician  . Financial resource strain: Not on file  . Food insecurity    Worry: Not on file    Inability: Not on file  . Transportation needs    Medical: Not on file    Non-medical: Not on file  Tobacco Use  . Smoking status: Former Smoker    Packs/day: 1.00    Years: 15.00    Pack years: 15.00    Types: Cigarettes    Quit date: 12/04/1995    Years since quitting: 23.0  . Smokeless tobacco: Never Used  . Tobacco comment: occ cigars   Substance and Sexual Activity  . Alcohol use: Yes    Alcohol/week: 12.0 standard drinks    Types: 12 Standard drinks or equivalent  per week    Comment: social drinker  . Drug use: No  . Sexual activity: Not Currently  Lifestyle  . Physical activity    Days per week: Not on file    Minutes per session: Not on file  . Stress: Not on file  Relationships  . Social Herbalist on phone: Not on file    Gets together: Not on file    Attends religious service: Not on file    Active member of club or organization: Not on file    Attends meetings of clubs or organizations: Not on file    Relationship status: Not on file  . Intimate partner violence    Fear of current or ex partner: Not on file    Emotionally abused: Not on file    Physically abused: Not on file    Forced sexual activity: Not on file  Other Topics Concern  . Not on file  Social History Narrative   Lost first wife remotely.   Widow, lost 2nd wife 08-03-16   Lives alone, no biological children   2 step children ([place], [place])  from second wife , still very close to them  Family History  Problem Relation Age of Onset  . Hypertension Mother   . Coronary artery disease Mother        M CABG at age 54s  . Alzheimer's disease Mother   . Diabetes Sister   . Colon cancer Neg Hx   . Prostate cancer Neg Hx   . Stomach cancer Neg Hx   . Rectal cancer Neg Hx      Allergies as of 12/08/2018   No Known Allergies     Medication List       Accurate as of December 08, 2018 11:59 PM. If you have any questions, ask your nurse or doctor.        STOP taking these medications   doxycycline 100 MG tablet Commonly known as: VIBRA-TABS Stopped by: Kathlene November, MD     TAKE these medications   amLODipine 10 MG tablet Commonly known as: NORVASC Take 1 tablet (10 mg total) by mouth daily.   aspirin 81 MG tablet Take 81 mg by mouth daily.   bisacodyl 5 MG EC tablet Commonly known as: DULCOLAX Take 5 mg by mouth once. For colon 5-20   colchicine 0.6 MG tablet Take 1 tablet (0.6 mg total) by mouth 2 (two) times daily as needed.    hydrochlorothiazide 25 MG tablet Commonly known as: HYDRODIURIL Take 1 tablet (25 mg total) by mouth daily.   metFORMIN 850 MG tablet Commonly known as: GLUCOPHAGE Take 1 tablet (850 mg total) by mouth 2 (two) times daily with a meal.   Santyl ointment Generic drug: collagenase Apply 1 application topically daily.   sildenafil 20 MG tablet Commonly known as: REVATIO TAKE 3 TO 4 TABLETS BY MOUTH EVERY DAY AS NEEDED   simvastatin 40 MG tablet Commonly known as: ZOCOR Take 0.5 tablets (20 mg total) by mouth at bedtime.   valsartan 320 MG tablet Commonly known as: DIOVAN Take 1 tablet (320 mg total) by mouth daily.           Objective:   Physical Exam BP (!) 147/69 (BP Location: Left Arm, Patient Position: Sitting, Cuff Size: Normal)   Pulse 67   Temp (!) 97.1 F (36.2 C) (Temporal)   Resp 16   Ht 5\' 9"  (1.753 m)   Wt 228 lb 8 oz (103.6 kg)   SpO2 100%   BMI 33.74 kg/m  General: Well developed, NAD, BMI noted Neck: No  thyromegaly  HEENT:  Normocephalic . Face symmetric, atraumatic Lungs:  CTA B Normal respiratory effort, no intercostal retractions, no accessory muscle use. Heart: RRR,  no murmur.  No pretibial edema bilaterally  Abdomen:  Not distended, soft, non-tender. No rebound or rigidity.   Skin: Previously seen wound at the foot is completely healed. DM foot exam: No edema, good pulses, pinprick normal. Neurologic:  alert & oriented X3.  Speech normal, gait appropriate for age and unassisted Strength symmetric and appropriate for age.  Psych: Cognition and judgment appear intact.  Cooperative with normal attention span and concentration.  Behavior appropriate. No anxious or depressed appearing.     Assessment     Assessment DM, Started metformin 04-2015 HTN Hyperlipidemia Hypogonadism Dx few years ago by endocrinology, secondary hypogonadism. Was rx testosterone, did not notice any subjective difference while on meds. Currently on no HRT.  DEXA 2014 normal  Erectile dysfunction Sleep apnea, mild, saw pulm 2014, CPAP was not mandatory, mostly to increase his QOL (see pulm note) DJD Gout Elevated LFTs x years, previous w/u (-) per pt ;  hep B and C (-) [id number] Sees dermatology EKG 09/2017: RBBB, echo LVH.  PLAN: Here for CPX HTN, hyperlipidemia, gout: All seem well controlled, ambulatory BPs 125, 130.  Continue same medications, checking labs. Foot wound : See last visit, area looks normal now. RTC 6 months

## 2018-12-09 NOTE — Assessment & Plan Note (Signed)
Here for CPX HTN, hyperlipidemia, gout: All seem well controlled, ambulatory BPs 125, 130.  Continue same medications, checking labs. Foot wound : See last visit, area looks normal now. RTC 6 months

## 2018-12-09 NOTE — Assessment & Plan Note (Signed)
-  TD 2014 - shingles shot 2012 - PNM 23-- 04/21/13 - prevnar 9/11/ 2015 - shingrix x 2 2019 - had a  flu shot   -CCS: Cscope at age 68, normal, Dr Cher Nakai. Cscope 2016 normal, Dr Hilarie Fredrickson, 10 years -prostate ca screening: DRE slt enlarged 2019, repeat PSA today   -Labs:  CMP, A1c, FLP, PSA  -Remains active, trying to eat healthy.

## 2018-12-19 ENCOUNTER — Other Ambulatory Visit: Payer: Self-pay | Admitting: Internal Medicine

## 2019-01-05 DIAGNOSIS — H9012 Conductive hearing loss, unilateral, left ear, with unrestricted hearing on the contralateral side: Secondary | ICD-10-CM | POA: Diagnosis not present

## 2019-01-05 DIAGNOSIS — H60332 Swimmer's ear, left ear: Secondary | ICD-10-CM | POA: Diagnosis not present

## 2019-01-05 DIAGNOSIS — H938X1 Other specified disorders of right ear: Secondary | ICD-10-CM | POA: Diagnosis not present

## 2019-01-05 DIAGNOSIS — Z7289 Other problems related to lifestyle: Secondary | ICD-10-CM | POA: Diagnosis not present

## 2019-01-05 DIAGNOSIS — Z87891 Personal history of nicotine dependence: Secondary | ICD-10-CM | POA: Diagnosis not present

## 2019-01-17 DIAGNOSIS — D229 Melanocytic nevi, unspecified: Secondary | ICD-10-CM | POA: Diagnosis not present

## 2019-01-17 DIAGNOSIS — L821 Other seborrheic keratosis: Secondary | ICD-10-CM | POA: Diagnosis not present

## 2019-01-17 DIAGNOSIS — C44329 Squamous cell carcinoma of skin of other parts of face: Secondary | ICD-10-CM | POA: Diagnosis not present

## 2019-01-17 DIAGNOSIS — D1801 Hemangioma of skin and subcutaneous tissue: Secondary | ICD-10-CM | POA: Diagnosis not present

## 2019-01-17 DIAGNOSIS — Z85828 Personal history of other malignant neoplasm of skin: Secondary | ICD-10-CM | POA: Diagnosis not present

## 2019-01-17 DIAGNOSIS — L570 Actinic keratosis: Secondary | ICD-10-CM | POA: Diagnosis not present

## 2019-01-17 DIAGNOSIS — D485 Neoplasm of uncertain behavior of skin: Secondary | ICD-10-CM | POA: Diagnosis not present

## 2019-01-19 ENCOUNTER — Other Ambulatory Visit: Payer: Self-pay | Admitting: Internal Medicine

## 2019-01-19 NOTE — Telephone Encounter (Signed)
Last OV 12/08/18 Last refill 08/01/18 #90/1 Next OV 06/07/19

## 2019-01-20 ENCOUNTER — Other Ambulatory Visit: Payer: Self-pay | Admitting: Internal Medicine

## 2019-01-20 NOTE — Telephone Encounter (Signed)
Last OV 12/08/18 Last refill Amlodipine 07/13/18                 Metformin 07/13/18 #180/1 Next OV 06/07/19

## 2019-01-29 ENCOUNTER — Other Ambulatory Visit: Payer: Self-pay | Admitting: Internal Medicine

## 2019-02-02 ENCOUNTER — Telehealth: Payer: Self-pay | Admitting: Internal Medicine

## 2019-02-02 NOTE — Telephone Encounter (Signed)
Called patient to schedule AWV, but no answer, could not leave voicemail. Will try to call patient back at later time. SF

## 2019-02-06 DIAGNOSIS — R69 Illness, unspecified: Secondary | ICD-10-CM | POA: Diagnosis not present

## 2019-02-07 DIAGNOSIS — C44329 Squamous cell carcinoma of skin of other parts of face: Secondary | ICD-10-CM | POA: Diagnosis not present

## 2019-02-21 DIAGNOSIS — R69 Illness, unspecified: Secondary | ICD-10-CM | POA: Diagnosis not present

## 2019-02-22 ENCOUNTER — Other Ambulatory Visit: Payer: Self-pay | Admitting: Internal Medicine

## 2019-04-11 DIAGNOSIS — Z85828 Personal history of other malignant neoplasm of skin: Secondary | ICD-10-CM | POA: Diagnosis not present

## 2019-04-11 DIAGNOSIS — L570 Actinic keratosis: Secondary | ICD-10-CM | POA: Diagnosis not present

## 2019-04-11 DIAGNOSIS — L905 Scar conditions and fibrosis of skin: Secondary | ICD-10-CM | POA: Diagnosis not present

## 2019-05-31 DIAGNOSIS — E119 Type 2 diabetes mellitus without complications: Secondary | ICD-10-CM | POA: Diagnosis not present

## 2019-05-31 LAB — HM DIABETES EYE EXAM

## 2019-06-07 ENCOUNTER — Encounter: Payer: Self-pay | Admitting: Internal Medicine

## 2019-06-07 ENCOUNTER — Other Ambulatory Visit: Payer: Self-pay

## 2019-06-07 ENCOUNTER — Ambulatory Visit (INDEPENDENT_AMBULATORY_CARE_PROVIDER_SITE_OTHER): Payer: Medicare HMO | Admitting: Internal Medicine

## 2019-06-07 VITALS — BP 150/77 | HR 73 | Temp 96.0°F | Resp 18 | Ht 69.0 in | Wt 228.4 lb

## 2019-06-07 DIAGNOSIS — I10 Essential (primary) hypertension: Secondary | ICD-10-CM | POA: Diagnosis not present

## 2019-06-07 DIAGNOSIS — E1169 Type 2 diabetes mellitus with other specified complication: Secondary | ICD-10-CM

## 2019-06-07 LAB — CBC WITH DIFFERENTIAL/PLATELET
Basophils Absolute: 0.1 K/uL (ref 0.0–0.1)
Basophils Relative: 0.6 % (ref 0.0–3.0)
Eosinophils Absolute: 0.5 K/uL (ref 0.0–0.7)
Eosinophils Relative: 5.6 % — ABNORMAL HIGH (ref 0.0–5.0)
HCT: 45.3 % (ref 39.0–52.0)
Hemoglobin: 16.1 g/dL (ref 13.0–17.0)
Lymphocytes Relative: 27.7 % (ref 12.0–46.0)
Lymphs Abs: 2.4 K/uL (ref 0.7–4.0)
MCHC: 35.6 g/dL (ref 30.0–36.0)
MCV: 94.5 fl (ref 78.0–100.0)
Monocytes Absolute: 0.8 K/uL (ref 0.1–1.0)
Monocytes Relative: 8.9 % (ref 3.0–12.0)
Neutro Abs: 5 K/uL (ref 1.4–7.7)
Neutrophils Relative %: 57.2 % (ref 43.0–77.0)
Platelets: 271 K/uL (ref 150.0–400.0)
RBC: 4.79 Mil/uL (ref 4.22–5.81)
RDW: 13.1 % (ref 11.5–15.5)
WBC: 8.7 K/uL (ref 4.0–10.5)

## 2019-06-07 LAB — BASIC METABOLIC PANEL WITH GFR
BUN: 19 mg/dL (ref 6–23)
CO2: 29 meq/L (ref 19–32)
Calcium: 9.8 mg/dL (ref 8.4–10.5)
Chloride: 93 meq/L — ABNORMAL LOW (ref 96–112)
Creatinine, Ser: 1.09 mg/dL (ref 0.40–1.50)
GFR: 66.63 mL/min
Glucose, Bld: 157 mg/dL — ABNORMAL HIGH (ref 70–99)
Potassium: 4.7 meq/L (ref 3.5–5.1)
Sodium: 130 meq/L — ABNORMAL LOW (ref 135–145)

## 2019-06-07 LAB — HEMOGLOBIN A1C: Hgb A1c MFr Bld: 6.4 % (ref 4.6–6.5)

## 2019-06-07 NOTE — Patient Instructions (Addendum)
Please schedule Medicare Wellness with Glenard Haring.   Per our records you are due for an eye exam. Please contact your eye doctor to schedule an appointment. Please have them send copies of your office visit notes to [place]. Our fax number is [phone].   Continue checking your blood pressures BP GOAL is between 110/65 and  135/85. If it is consistently higher or lower, let me know    GO TO THE LAB : Get the blood work     [place], [place] back for a physical exam in 6 months

## 2019-06-07 NOTE — Progress Notes (Signed)
Subjective:    Patient ID: Jimmy Valentine, male    DOB: [birth year], 69 y.o.   MRN: [id number]  DOS:  06/07/2019 Type of visit - description: Routine visit Since the last time he was here he is doing well. BP today slightly elevated typically okay at home   Review of Systems Remains active, does yard work, still working some from  [employer] but thinking about retire soon  Past Medical History:  Diagnosis Date  . Arthritis   . Elevated LFTs   . Erectile dysfunction   . Gout 09/03/2011   RF colchicine    . Hyperlipidemia 1990s  . Hypertension 1980s  . 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 Sleep apnea    does't use cpap  . Sudden hearing loss, left 2017   idiopathic sensorineural hearing loss    Past Surgical History:  Procedure Laterality Date  . TOTAL KNEE ARTHROPLASTY Right 2005  . TOTAL KNEE ARTHROPLASTY  12/04/2011   LEFT-- TOTAL KNEE ARTHROPLASTY;  Surgeon: Sydnee Cabal, MD;  Location: [facility];  Service: Orthopedics;  Laterality: Left;    Allergies as of 06/07/2019   No Known Allergies     Medication List       Accurate as of Jun 07, 2019 11:59 PM. If you have any questions, ask your nurse or doctor.        STOP taking these medications   Santyl ointment Generic drug: collagenase Stopped by: Kathlene November, MD     TAKE these medications   amLODipine 10 MG tablet Commonly known as: NORVASC TAKE 1 TABLET BY MOUTH EVERY DAY   aspirin 81 MG tablet Take 81 mg by mouth daily.   bisacodyl 5 MG EC tablet Commonly known as: DULCOLAX Take 5 mg by mouth once. For colon 5-20   colchicine 0.6 MG tablet Take 1 tablet (0.6 mg total) by mouth 2 (two) times daily as needed.   hydrochlorothiazide 25 MG tablet Commonly known as: HYDRODIURIL TAKE 1 TABLET BY MOUTH EVERY DAY   metFORMIN 850 MG tablet Commonly known as: GLUCOPHAGE TAKE 1 TABLET (850 MG TOTAL) BY MOUTH 2 (TWO) TIMES DAILY WITH A MEAL.    sildenafil 20 MG tablet Commonly known as: REVATIO TAKE 3 TO 4 TABLETS BY MOUTH EVERY DAY AS NEEDED   simvastatin 40 MG tablet Commonly known as: ZOCOR Take 0.5 tablets (20 mg total) by mouth at bedtime.   valsartan 320 MG tablet Commonly known as: DIOVAN TAKE 1 TABLET BY MOUTH EVERY DAY          Objective:   Physical Exam BP (!) 150/77 (BP Location: Left Arm, Patient Position: Sitting, Cuff Size: Normal)   Pulse 73   Temp (!) 96 F (35.6 C) (Temporal)   Resp 18   Ht 5\' 9"  (1.753 m)   Wt 228 lb 6 oz (103.6 kg)   SpO2 100%   BMI 33.73 kg/m  General:   Well developed, NAD, BMI noted. HEENT:  Normocephalic . Face symmetric, atraumatic Lungs:  CTA B Normal respiratory effort, no intercostal retractions, no accessory muscle use. Heart: RRR,  no murmur.  Lower extremities: no pretibial edema bilaterally  Skin: Not pale. Not jaundice Neurologic:  alert & oriented X3.  Speech normal, gait appropriate for age and unassisted Psych--  Cognition and judgment appear intact.  Cooperative with normal attention span and concentration.  Behavior appropriate. No anxious or depressed appearing.      Assessment  Assessment DM, Started metformin 04-2015 HTN Hyperlipidemia Hypogonadism Dx few years ago by endocrinology, secondary hypogonadism. Was rx testosterone, did not notice any subjective difference while on meds. Currently on no HRT. DEXA 2014 normal  Erectile dysfunction Sleep apnea, mild, saw pulm 2014, CPAP was not mandatory, mostly to increase his QOL (see pulm note) DJD Gout Elevated LFTs x years, previous w/u (-) per pt ; hep B and C (-) 10-2014 Sees dermatology EKG 09/2017: RBBB, echo LVH.  PLAN: DM: Currently on Metformin, no change, check A1c. HTN: On amlodipine, HCTZ, Diovan.  Ambulatory BPs typically in the 130s, slightly elevated today, no change, continue monitoring, goals discussed.  Check a BMP, CBC. Gout: Hardly ever has an episode that quickly  respond to colchicine. Preventive care: S/p moderna x2 RTC 6 months CPX  This visit occurred during the SARS-CoV-2 public health emergency.  Safety protocols were in place, including screening questions prior to the visit, additional usage of staff PPE, and extensive cleaning of exam room while observing appropriate contact time as indicated for disinfecting solutions.

## 2019-06-07 NOTE — Progress Notes (Signed)
Pre visit review using our clinic review tool, if applicable. No additional management support is needed unless otherwise documented below in the visit note. 

## 2019-06-08 NOTE — Assessment & Plan Note (Signed)
DM: Currently on Metformin, no change, check A1c. HTN: On amlodipine, HCTZ, Diovan.  Ambulatory BPs typically in the 130s, slightly elevated today, no change, continue monitoring, goals discussed.  Check a BMP, CBC. Gout: Hardly ever has an episode that quickly respond to colchicine. Preventive care: S/p moderna x2 RTC 6 months CPX

## 2019-07-17 ENCOUNTER — Other Ambulatory Visit: Payer: Self-pay | Admitting: Internal Medicine

## 2019-07-28 ENCOUNTER — Other Ambulatory Visit: Payer: Self-pay | Admitting: Internal Medicine

## 2019-08-09 DIAGNOSIS — R69 Illness, unspecified: Secondary | ICD-10-CM | POA: Diagnosis not present

## 2019-08-25 ENCOUNTER — Encounter: Payer: Self-pay | Admitting: Internal Medicine

## 2019-08-28 ENCOUNTER — Other Ambulatory Visit: Payer: Self-pay

## 2019-08-28 MED ORDER — SIMVASTATIN 40 MG PO TABS: 20.0000 mg | ORAL_TABLET | Freq: Every day | ORAL | 1 refills | Status: DC

## 2019-08-31 ENCOUNTER — Telehealth: Payer: Self-pay | Admitting: Internal Medicine

## 2019-08-31 NOTE — Telephone Encounter (Signed)
Caller: Aubert Call back # [phone]  FYI  Patient would like to let you know his diabetic eye exam was done on 05/31/19 by [facility].

## 2019-08-31 NOTE — Telephone Encounter (Signed)
Will reach out to [facility] for records. Thank you.

## 2019-10-11 DIAGNOSIS — Z8669 Personal history of other diseases of the nervous system and sense organs: Secondary | ICD-10-CM | POA: Diagnosis not present

## 2019-10-11 DIAGNOSIS — B369 Superficial mycosis, unspecified: Secondary | ICD-10-CM | POA: Insufficient documentation

## 2019-11-07 DIAGNOSIS — R69 Illness, unspecified: Secondary | ICD-10-CM | POA: Diagnosis not present

## 2019-12-05 DIAGNOSIS — R69 Illness, unspecified: Secondary | ICD-10-CM | POA: Diagnosis not present

## 2019-12-15 ENCOUNTER — Encounter: Payer: Medicare HMO | Admitting: Internal Medicine

## 2019-12-15 ENCOUNTER — Encounter: Payer: Self-pay | Admitting: Internal Medicine

## 2020-01-05 ENCOUNTER — Other Ambulatory Visit: Payer: Self-pay | Admitting: Internal Medicine

## 2020-01-06 ENCOUNTER — Other Ambulatory Visit: Payer: Self-pay | Admitting: Internal Medicine

## 2020-01-08 ENCOUNTER — Telehealth: Payer: Self-pay

## 2020-01-08 NOTE — Telephone Encounter (Signed)
PA initiated via Covermymeds; KEY: [id number]. Awaiting determination.

## 2020-01-08 NOTE — Telephone Encounter (Signed)
PA denied. Medication not covered for erectile dysfunction under Medicare Part D rules.

## 2020-01-16 ENCOUNTER — Other Ambulatory Visit: Payer: Self-pay

## 2020-01-16 ENCOUNTER — Ambulatory Visit (INDEPENDENT_AMBULATORY_CARE_PROVIDER_SITE_OTHER): Payer: Medicare HMO | Admitting: Internal Medicine

## 2020-01-16 ENCOUNTER — Encounter: Payer: Self-pay | Admitting: Internal Medicine

## 2020-01-16 VITALS — BP 144/80 | HR 80 | Temp 98.2°F | Ht 69.0 in | Wt 231.0 lb

## 2020-01-16 DIAGNOSIS — E1169 Type 2 diabetes mellitus with other specified complication: Secondary | ICD-10-CM

## 2020-01-16 DIAGNOSIS — Z0000 Encounter for general adult medical examination without abnormal findings: Secondary | ICD-10-CM | POA: Diagnosis not present

## 2020-01-16 NOTE — Progress Notes (Signed)
Subjective:    Patient ID: Jimmy Valentine, male    DOB: [birth year], 69 y.o.   MRN: [id number]  DOS:  01/16/2020 Type of visit - description: CPX In general feeling well, good med compliance, no concerns.   Review of Systems   A 14 point review of systems is negative    Past Medical History:  Diagnosis Date  . Arthritis   . Elevated LFTs   . Erectile dysfunction   . Gout 09/03/2011   RF colchicine    . Hyperlipidemia 1990s  . Hypertension 1980s  . 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 Sleep apnea    does't use cpap  . Sudden hearing loss, left 2017   idiopathic sensorineural hearing loss    Past Surgical History:  Procedure Laterality Date  . TOTAL KNEE ARTHROPLASTY Right 2005  . TOTAL KNEE ARTHROPLASTY  12/04/2011   LEFT-- TOTAL KNEE ARTHROPLASTY;  Surgeon: Sydnee Cabal, MD;  Location: [facility];  Service: Orthopedics;  Laterality: Left;    Allergies as of 01/16/2020   No Known Allergies     Medication List       Accurate as of January 16, 2020 11:59 PM. If you have any questions, ask your nurse or doctor.        amLODipine 10 MG tablet Commonly known as: NORVASC TAKE 1 TABLET BY MOUTH EVERY DAY   aspirin 81 MG tablet Take 81 mg by mouth daily.   bisacodyl 5 MG EC tablet Commonly known as: DULCOLAX Take 5 mg by mouth once. For colon 5-20   colchicine 0.6 MG tablet Take 1 tablet (0.6 mg total) by mouth 2 (two) times daily as needed.   hydrochlorothiazide 25 MG tablet Commonly known as: HYDRODIURIL Take 1 tablet (25 mg total) by mouth daily.   metFORMIN 850 MG tablet Commonly known as: GLUCOPHAGE TAKE 1 TABLET (850 MG TOTAL) BY MOUTH 2 (TWO) TIMES DAILY WITH A MEAL.   sildenafil 20 MG tablet Commonly known as: REVATIO TAKE 3 TO 4 TABLETS BY MOUTH EVERY DAY AS NEEDED   simvastatin 40 MG tablet Commonly known as: ZOCOR Take 0.5 tablets (20 mg total) by mouth at bedtime.    valsartan 320 MG tablet Commonly known as: DIOVAN TAKE 1 TABLET BY MOUTH EVERY DAY          Objective:   Physical Exam BP (!) 144/80 (BP Location: Left Arm, Patient Position: Sitting, Cuff Size: Large)   Pulse 80   Temp 98.2 F (36.8 C) (Oral)   Ht 5\' 9"  (1.753 m)   Wt 231 lb (104.8 kg)   SpO2 96%   BMI 34.11 kg/m  General: Well developed, NAD, BMI noted Neck: No  thyromegaly  HEENT:  Normocephalic . Face symmetric, atraumatic Lungs:  CTA B Normal respiratory effort, no intercostal retractions, no accessory muscle use. Heart: RRR,  no murmur.  Abdomen:  Not distended, soft, non-tender. No rebound or rigidity. DRE: Normal sphincter tone, prostate is slightly enlarged but otherwise not tender, soft. No stools found DM foot exam: No edema, good pedal pulses, pinprick examination normal Skin: Exposed areas without rash. Not pale. Not jaundice Neurologic:  alert & oriented X3.  Speech normal, gait appropriate for age and unassisted Strength symmetric and appropriate for age.  Psych: Cognition and judgment appear intact.  Cooperative with normal attention span and concentration.  Behavior appropriate. No anxious or depressed appearing.     Assessment  Assessment DM, Started metformin 04-2015 HTN Hyperlipidemia Hypogonadism Dx few years ago by endocrinology, secondary hypogonadism. Was rx testosterone, did not notice any subjective difference while on meds. Currently on no HRT. DEXA 2014 normal  Erectile dysfunction Sleep apnea, mild, saw pulm 2014, CPAP was not mandatory, mostly to increase his QOL (see pulm note) DJD Gout Elevated LFTs x years, previous w/u (-) per pt ; hep B and C (-) 10-2014 Sees dermatology EKG 09/2017: RBBB, echo LVH.  PLAN: Here for CPX DM, HTN, hyperlipidemia, gout: Doing great on current meds, feet exam negative, BPs at home typically in the 120s/80. Hardly ever has gout episodes,uses colchicine as needed. Checking labs. Next visit  in about 6 months. Social : to retire in 3 weeks RTC 6 months  -TD 2014 - shingles shot 2012 - PNM 23-- 04/21/13; - prevnar 9/11/ 2015 - shingrix x 2  - covid vax x 3 - had a  flu shot   -CCS: Cscope at age 59, normal, Dr Cher Nakai. Cscope 2016 normal, Dr Hilarie Fredrickson, 10 years -prostate ca screening: DRE normal, check a PSA. -Labs: CMP, FLP, A1c, PSA.  -Remains active, trying to eat healthy.      This visit occurred during the SARS-CoV-2 public health emergency.  Safety protocols were in place, including screening questions prior to the visit, additional usage of staff PPE, and extensive cleaning of exam room while observing appropriate contact time as indicated for disinfecting solutions.

## 2020-01-16 NOTE — Patient Instructions (Addendum)
Check the  blood pressure once or twice a month. BP GOAL is between 110/65 and  135/85. If it is consistently higher or lower, let me know   GO TO THE LAB : Get the blood work     [place], [place] back for a checkup in 6 months   Advance Directive  Advance directives are legal documents that let you make choices ahead of time about your health care and medical treatment in case you become unable to communicate for yourself. Advance directives are a way for you to make known your wishes to family, friends, and health care providers. This can let others know about your end-of-life care if you become unable to communicate. Discussing and writing advance directives should happen over time rather than all at once. Advance directives can be changed depending on your situation and what you want, even after you have signed the advance directives. There are different types of advance directives, such as:  Medical power of attorney.  Living will.  Do not resuscitate (DNR) or do not attempt resuscitation (DNAR) order. Health care proxy and medical power of attorney A health care proxy is also called a health care agent. This is a person who is appointed to make medical decisions for you in cases where you are unable to make the decisions yourself. Generally, people choose someone they know well and trust to represent their preferences. Make sure to ask this person for an agreement to act as your proxy. A proxy may have to exercise judgment in the event of a medical decision for which your wishes are not known. A medical power of attorney is a legal document that names your health care proxy. Depending on the laws in your state, after the document is written, it may also need to be:  Signed.  Notarized.  Dated.  Copied.  Witnessed.  Incorporated into your medical record. You may also want to appoint someone to manage your money in a situation in  which you are unable to do so. This is called a durable power of attorney for finances. It is a separate legal document from the durable power of attorney for health care. You may choose the same person or someone different from your health care proxy to act as your agent in money matters. If you do not appoint a proxy, or if there is a concern that the proxy is not acting in your best interests, a court may appoint a guardian to act on your behalf. Living will A living will is a set of instructions that state your wishes about medical care when you cannot express them yourself. Health care providers should keep a copy of your living will in your medical record. You may want to give a copy to family members or friends. To alert caregivers in case of an emergency, you can place a card in your wallet to let them know that you have a living will and where they can find it. A living will is used if you become:  Terminally ill.  Disabled.  Unable to communicate or make decisions. Items to consider in your living will include:  To use or not to use life-support equipment, such as dialysis machines and breathing machines (ventilators).  A DNR or DNAR order. This tells health care providers not to use cardiopulmonary resuscitation (CPR) if breathing or heartbeat stops.  To use or not to use tube feeding.  To be given or not  to be given food and fluids.  Comfort (palliative) care when the goal becomes comfort rather than a cure.  Donation of organs and tissues. A living will does not give instructions for distributing your money and property if you should pass away. DNR or DNAR A DNR or DNAR order is a request not to have CPR in the event that your heart stops beating or you stop breathing. If a DNR or DNAR order has not been made and shared, a health care provider will try to help any patient whose heart has stopped or who has stopped breathing. If you plan to have surgery, talk with your health  care provider about how your DNR or DNAR order will be followed if problems occur. What if I do not have an advance directive? If you do not have an advance directive, some states assign family decision makers to act on your behalf based on how closely you are related to them. Each state has its own laws about advance directives. You may want to check with your health care provider, attorney, or state representative about the laws in your state. Summary  Advance directives are the legal documents that allow you to make choices ahead of time about your health care and medical treatment in case you become unable to tell others about your care.  The process of discussing and writing advance directives should happen over time. You can change the advance directives, even after you have signed them.  Advance directives include DNR or DNAR orders, living wills, and designating an agent as your medical power of attorney. This information is not intended to replace advice given to you by your health care provider. Make sure you discuss any questions you have with your health care provider. Document Revised: 08/18/2018 Document Reviewed: 08/18/2018 Elsevier Patient Education  [place].

## 2020-01-17 ENCOUNTER — Encounter: Payer: Self-pay | Admitting: Internal Medicine

## 2020-01-17 LAB — COMPREHENSIVE METABOLIC PANEL
ALT: 51 U/L (ref 0–53)
AST: 25 U/L (ref 0–37)
Albumin: 4.6 g/dL (ref 3.5–5.2)
Alkaline Phosphatase: 78 U/L (ref 39–117)
BUN: 17 mg/dL (ref 6–23)
CO2: 28 mEq/L (ref 19–32)
Calcium: 10 mg/dL (ref 8.4–10.5)
Chloride: 91 mEq/L — ABNORMAL LOW (ref 96–112)
Creatinine, Ser: 1.07 mg/dL (ref 0.40–1.50)
GFR: 69.61 mL/min (ref >60.00)
Glucose, Bld: 143 mg/dL — ABNORMAL HIGH (ref 70–99)
Potassium: 4.4 mEq/L (ref 3.5–5.1)
Sodium: 130 mEq/L — ABNORMAL LOW (ref 135–145)
Total Bilirubin: 0.8 mg/dL (ref 0.2–1.2)
Total Protein: 7.3 g/dL (ref 6.0–8.3)

## 2020-01-17 LAB — LIPID PANEL
Cholesterol: 171 mg/dL (ref 0–200)
HDL: 47.8 mg/dL (ref >39.00)
LDL Cholesterol: 97 mg/dL (ref 0–99)
NonHDL: 123.18
Total CHOL/HDL Ratio: 4
Triglycerides: 129 mg/dL (ref 0.0–149.0)
VLDL: 25.8 mg/dL (ref 0.0–40.0)

## 2020-01-17 LAB — HEMOGLOBIN A1C: Hgb A1c MFr Bld: 6.6 % — ABNORMAL HIGH (ref 4.6–6.5)

## 2020-01-17 LAB — PSA: PSA: 1.41 ng/mL (ref 0.10–4.00)

## 2020-01-17 NOTE — Assessment & Plan Note (Signed)
-  TD 2014 - shingles shot 2012 - PNM 23-- 04/21/13; - prevnar 9/11/ 2015 - shingrix x 2  - covid vax x 3 - had a  flu shot   -CCS: Cscope at age 69, normal, Dr Cher Nakai. Cscope 2016 normal, Dr Hilarie Fredrickson, 10 years -prostate ca screening: DRE normal, check a PSA. -Labs: CMP, FLP, A1c, PSA.  -Remains active, trying to eat healthy.

## 2020-01-17 NOTE — Assessment & Plan Note (Signed)
Here for CPX DM, HTN, hyperlipidemia, gout: Doing great on current meds, feet exam negative, BPs at home typically in the 120s/80. Hardly ever has gout episodes,uses colchicine as needed. Checking labs. Next visit in about 6 months. Social : to retire in 3 weeks RTC 6 months

## 2020-01-18 ENCOUNTER — Other Ambulatory Visit: Payer: Self-pay | Admitting: Internal Medicine

## 2020-01-22 ENCOUNTER — Encounter: Payer: Self-pay | Admitting: Internal Medicine

## 2020-02-20 ENCOUNTER — Other Ambulatory Visit: Payer: Self-pay | Admitting: Internal Medicine

## 2020-04-23 ENCOUNTER — Other Ambulatory Visit: Payer: Self-pay | Admitting: Internal Medicine

## 2020-05-22 ENCOUNTER — Other Ambulatory Visit: Payer: Self-pay | Admitting: Internal Medicine

## 2020-06-26 ENCOUNTER — Other Ambulatory Visit: Payer: Self-pay | Admitting: Internal Medicine

## 2020-06-28 ENCOUNTER — Other Ambulatory Visit: Payer: Self-pay | Admitting: Internal Medicine

## 2020-07-11 DIAGNOSIS — E119 Type 2 diabetes mellitus without complications: Secondary | ICD-10-CM | POA: Diagnosis not present

## 2020-07-11 LAB — HM DIABETES EYE EXAM

## 2020-07-13 ENCOUNTER — Other Ambulatory Visit: Payer: Self-pay | Admitting: Internal Medicine

## 2020-07-17 ENCOUNTER — Encounter: Payer: Self-pay | Admitting: Internal Medicine

## 2020-07-18 ENCOUNTER — Encounter: Payer: Self-pay | Admitting: Internal Medicine

## 2020-07-18 ENCOUNTER — Other Ambulatory Visit: Payer: Self-pay

## 2020-07-18 ENCOUNTER — Ambulatory Visit (INDEPENDENT_AMBULATORY_CARE_PROVIDER_SITE_OTHER): Payer: Medicare HMO | Admitting: Internal Medicine

## 2020-07-18 VITALS — BP 144/76 | HR 77 | Temp 97.9°F | Resp 18 | Ht 69.0 in | Wt 236.0 lb

## 2020-07-18 DIAGNOSIS — I10 Essential (primary) hypertension: Secondary | ICD-10-CM

## 2020-07-18 DIAGNOSIS — E1169 Type 2 diabetes mellitus with other specified complication: Secondary | ICD-10-CM

## 2020-07-18 LAB — HEMOGLOBIN A1C: Hgb A1c MFr Bld: 7.3 % — ABNORMAL HIGH (ref 4.6–6.5)

## 2020-07-18 LAB — BASIC METABOLIC PANEL
BUN: 21 mg/dL (ref 6–23)
CO2: 27 mEq/L (ref 19–32)
Calcium: 9.9 mg/dL (ref 8.4–10.5)
Chloride: 96 mEq/L (ref 96–112)
Creatinine, Ser: 1.12 mg/dL (ref 0.40–1.50)
GFR: 65.67 mL/min (ref >60.00)
Glucose, Bld: 164 mg/dL — ABNORMAL HIGH (ref 70–99)
Potassium: 4.9 mEq/L (ref 3.5–5.1)
Sodium: 132 mEq/L — ABNORMAL LOW (ref 135–145)

## 2020-07-18 NOTE — Progress Notes (Signed)
Subjective:    Patient ID: Jimmy Valentine, male    DOB: 1950-06-01, 70 y.o.   MRN: [id number]  DOS:  07/18/2020 Type of visit - description: ROV  Routine checkup Today with talk about diabetes, hypertension, PSA. Ambulatory BPs are very good, no ambulatory CBGs  Review of Systems Denies dysuria, gross hematuria difficulty urinating.  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 If you have any questions, ask your nurse or doctor.          amLODipine 10 MG tablet Commonly known as: NORVASC Take 1 tablet (10 mg total) by mouth daily.   aspirin 81 MG tablet Take 81 mg by mouth daily.   bisacodyl 5 MG EC tablet Commonly known as: DULCOLAX Take 5 mg by mouth once. For colon 5-20   colchicine 0.6 MG tablet Take 1 tablet (0.6 mg total) by mouth 2 (two) times daily as needed.   hydrochlorothiazide 25 MG tablet Commonly known as: HYDRODIURIL Take 1 tablet (25 mg total) by mouth daily.   metFORMIN 850 MG tablet Commonly known as: GLUCOPHAGE TAKE 1 TABLET (850 MG TOTAL) BY MOUTH 2 (TWO) TIMES DAILY WITH A MEAL.   sildenafil 20 MG tablet Commonly known as: REVATIO TAKE 3 TO 4 TABLETS BY MOUTH EVERY DAY AS NEEDED   simvastatin 40 MG tablet Commonly known as:  ZOCOR Take 0.5 tablets (20 mg total) by mouth at bedtime.   valsartan 320 MG tablet Commonly known as: DIOVAN Take 1 tablet (320 mg total) by mouth daily.           Objective:   Physical Exam BP (!) 144/76 (BP Location: Left Arm, Patient Position: Sitting, Cuff Size: Normal)   Pulse 77   Temp 97.9 F (36.6 C) (Oral)   Resp 18   Ht 5\' 9"  (1.753 m)   Wt 236 lb (107 kg)   SpO2 98%   BMI 34.85 kg/m  General:   Well developed, NAD, BMI noted. HEENT:  Normocephalic . Face symmetric, atraumatic Lungs:  CTA B Normal respiratory effort, no intercostal retractions, no accessory muscle use. Heart: RRR,  no murmur.  Lower extremities: no pretibial edema bilaterally  Skin: Not pale. Not jaundice Neurologic:  alert & oriented X3.  Speech normal, gait appropriate for age and unassisted Psych--  Cognition and judgment appear intact.  Cooperative with normal attention span and concentration.  Behavior appropriate. No anxious or depressed appearing.      Assessment    Assessment DM, Started metformin 04-2015 HTN Hyperlipidemia Hypogonadism Dx few years ago by endocrinology, secondary hypogonadism. Was rx testosterone, did not notice any subjective difference while on meds.  Currently on no HRT. DEXA 2014 normal  Erectile dysfunction Sleep apnea, mild, saw pulm 2014, CPAP was not mandatory, mostly to increase his QOL (see pulm note) DJD Gout Elevated LFTs x years, previous w/u (-) per pt ; hep B and C (-) 10-2014 Sees dermatology EKG 09/2017: RBBB, echo LVH.  PLAN: DM: Continue metformin, no ambulatory CBGs, check A1c HTN: On amlodipine, HCTZ, Diovan.  Ambulatory BPs less than 140/80.  Continue monitoring, check BMP PSA fluctuates, has no symptoms. Preventive care: POA discussed. Social: Retired, enjoying. RTC 6 months CPX    This visit occurred during the SARS-CoV-2 public health emergency.  Safety protocols were in place, including screening questions prior to the  visit, additional usage of staff PPE, and extensive cleaning of exam room while observing appropriate contact time as indicated for disinfecting solutions.

## 2020-07-18 NOTE — Patient Instructions (Signed)
Check the  blood pressure  BP GOAL is between 110/65 and  140/85. If it is consistently higher or lower, let me know   .    GO TO THE LAB : Get the blood work     [place], [place] back for   a physical in 6 months   "Living will", "[place] of attorney": Advanced care planning  (If you already have a living will or healthcare power of attorney, please bring the copy to be scanned in your chart.)  Advance care planning is a process that supports adults in  understanding and sharing their preferences regarding future medical care.   The patient's preferences are recorded in documents called Advance Directives.    Advanced directives are completed (and can be modified at any time) while the patient is in full mental capacity.   The documentation should be available at all times to the patient, the family and the healthcare providers.  Bring in a copy to be scanned in your chart is an excellent idea and is recommended   This legal documents direct treatment decision making and/or appoint a surrogate to make the decision if the patient is not capable to do so.    Advance directives can be documented in many types of formats,  documents have names such as:  Lliving will  Durable power of attorney for healthcare (healthcare proxy or healthcare power of attorney)  Combined directives  Physician orders for life-sustaining treatment    More information at:  [email or web address]

## 2020-07-20 NOTE — Assessment & Plan Note (Signed)
DM: Continue metformin, no ambulatory CBGs, check A1c HTN: On amlodipine, HCTZ, Diovan.  Ambulatory BPs less than 140/80.  Continue monitoring, check BMP PSA fluctuates, has no symptoms. Preventive care: POA discussed. Social: Retired, enjoying. RTC 6 months CPX

## 2020-07-22 MED ORDER — METFORMIN HCL 1000 MG PO TABS: 1000.0000 mg | ORAL_TABLET | Freq: Two times a day (BID) | ORAL | 1 refills | Status: DC

## 2020-07-22 NOTE — Addendum Note (Signed)
Addended byDamita Dunnings D on: 07/22/2020 10:08 AM   Modules accepted: Orders

## 2020-07-25 DIAGNOSIS — H6123 Impacted cerumen, bilateral: Secondary | ICD-10-CM | POA: Diagnosis not present

## 2020-09-09 ENCOUNTER — Other Ambulatory Visit: Payer: Self-pay | Admitting: Internal Medicine

## 2020-09-10 ENCOUNTER — Other Ambulatory Visit: Payer: Self-pay

## 2020-09-10 MED ORDER — SIMVASTATIN 40 MG PO TABS: 20.0000 mg | ORAL_TABLET | Freq: Every day | ORAL | 1 refills | Status: DC

## 2020-10-01 ENCOUNTER — Ambulatory Visit (INDEPENDENT_AMBULATORY_CARE_PROVIDER_SITE_OTHER): Payer: Medicare HMO

## 2020-10-01 VITALS — Ht 69.0 in | Wt 229.0 lb

## 2020-10-01 DIAGNOSIS — Z0000 Encounter for general adult medical examination without abnormal findings: Secondary | ICD-10-CM | POA: Diagnosis not present

## 2020-10-01 NOTE — Progress Notes (Addendum)
Subjective:   Jimmy Valentine is a 70 y.o. male who presents for an Initial Medicare Annual Wellness Visit.  I connected with Jimmy Valentine today by telephone and verified that I am speaking with the correct person using two identifiers. Location patient: home Location provider: work Persons participating in the virtual visit: patient, Marine scientist.    I discussed the limitations, risks, security and privacy concerns of performing an evaluation and management service by telephone and the availability of in person appointments. I also discussed with the patient that there may be a patient responsible charge related to this service. The patient expressed understanding and verbally consented to this telephonic visit.    Interactive audio and video telecommunications were attempted between this provider and patient, however failed, due to patient having technical difficulties OR patient did not have access to video capability.  We continued and completed visit with audio only.  Some vital signs may be absent or patient reported.   Time Spent with patient on telephone encounter: 20 minutes   Review of Systems     Cardiac Risk Factors include: advanced age (>23mn, >>60women);male gender;diabetes mellitus;dyslipidemia;hypertension     Objective:    Today's Vitals   10/01/20 0939  Weight: 229 lb (103.9 kg)  Height: '5\' 9"'$  (1.753 m)   Body mass index is 33.82 kg/m.  Advanced Directives 10/01/2020 06/22/2014 06/08/2014 12/07/2011 12/04/2011 12/04/2011 12/01/2011  Does Patient Have a Medical Advance Directive? Yes Yes Yes - Patient has advance directive, copy not in chart - Patient has advance directive, copy in chart;Patient has advance directive, copy not in chart  Type of Advance Directive [place] - [place] - - - -  Copy of [place]? No - copy requested - - - - - -  Pre-existing out of facility DNR order (yellow  form or pink MOST form) - - - No No No -    Current Medications (verified) Outpatient Encounter Medications as of 10/01/2020  Medication Sig   amLODipine (NORVASC) 10 MG tablet Take 1 tablet (10 mg total) by mouth daily.   aspirin 81 MG tablet Take 81 mg by mouth daily.   bisacodyl (DULCOLAX) 5 MG EC tablet Take 5 mg by mouth once. For colon 5-20   colchicine 0.6 MG tablet Take 1 tablet (0.6 mg total) by mouth 2 (two) times daily as needed.   hydrochlorothiazide (HYDRODIURIL) 25 MG tablet Take 1 tablet (25 mg total) by mouth daily.   metFORMIN (GLUCOPHAGE) 1000 MG tablet Take 1 tablet (1,000 mg total) by mouth 2 (two) times daily with a meal.   sildenafil (REVATIO) 20 MG tablet TAKE 3 TO 4 TABLETS BY MOUTH EVERY DAY AS NEEDED   simvastatin (ZOCOR) 40 MG tablet Take 0.5 tablets (20 mg total) by mouth at bedtime.   valsartan (DIOVAN) 320 MG tablet Take 1 tablet (320 mg total) by mouth daily.   No facility-administered encounter medications on file as of 10/01/2020.    Allergies (verified) Patient has no known allergies.   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,had a MRI, was prescribed testosterone by endocrinology   Sleep apnea    does't use cpap   Sudden hearing loss, left 2017  idiopathic sensorineural hearing loss   Past Surgical History:  Procedure Laterality Date   TOTAL KNEE ARTHROPLASTY Right 2005   TOTAL KNEE ARTHROPLASTY  12/04/2011   LEFT-- TOTAL KNEE ARTHROPLASTY;  Surgeon: Sydnee Cabal, MD;  Location: [facility];  Service: Orthopedics;  Laterality: Left;   Family History  Problem Relation Age of Onset   Hypertension Mother    Coronary artery disease Mother        M CABG at age 43s   Alzheimer's disease Mother    Diabetes Sister    Colon cancer Neg Hx    Prostate cancer Neg  Hx    Stomach cancer Neg Hx    Rectal cancer Neg Hx    Social History   Socioeconomic History   Marital status: Widowed    Spouse name: married   Number of children: 0   Years of education: Not on file   Highest education level: Not on file  Occupational History   Occupation: Biochemist, clinical, still works   Tobacco Use   Smoking status: Former    Packs/day: 1.00    Years: 15.00    Pack years: 15.00    Types: Cigarettes    Quit date: 12/04/1995    Years since quitting: 24.8   Smokeless tobacco: Never   Tobacco comments:    occ cigars   Substance and Sexual Activity   Alcohol use: Yes    Alcohol/week: 12.0 standard drinks    Types: 12 Standard drinks or equivalent per week    Comment: social drinker   Drug use: No   Sexual activity: Not Currently  Other Topics Concern   Not on file  Social History Narrative   Lost first wife remotely.   Widow, lost 2nd wife 08-03-16   Lives alone, no biological children   2 step children ([place], [place])  from second wife , still very close to them      Social Determinants of Radio broadcast assistant Strain: Low Risk    Difficulty of Paying Living Expenses: Not hard at all  Food Insecurity: No Food Insecurity   Worried About Charity fundraiser in the Last Year: Never true   Arboriculturist in the Last Year: Never true  Transportation Needs: No Transportation Needs   Lack of Transportation (Medical): No   Lack of Transportation (Non-Medical): No  Physical Activity: Insufficiently Active   Days of Exercise per Week: 3 days   Minutes of Exercise per Session: 30 min  Stress: No Stress Concern Present   Feeling of Stress : Not at all  Social Connections: Socially Isolated   Frequency of Communication with Friends and Family: More than three times a week   Frequency of Social Gatherings with Friends and Family: More than three times a week   Attends Religious Services: Never   Marine scientist or Organizations: No   Attends Theatre manager Meetings: Never   Marital Status: Widowed    Tobacco Counseling Counseling given: Not Answered Tobacco comments: occ cigars    Clinical Intake:  Pre-visit preparation completed: Yes  Pain : No/denies pain     BMI - recorded: 33.82 Nutritional Status: BMI > 30  Obese Nutritional Risks: None Diabetes: Yes CBG done?: No Did pt. bring in CBG monitor from home?: No (phone visit)  How often do you need to have someone help you when you read instructions, pamphlets, or other written materials from your doctor or pharmacy?: 1 - Never  Diabetic?No  Interpreter Needed?: No  Information entered by :: Caroleen Hamman LPN   Activities of Daily Living In your present state of health, do you have any difficulty performing the following activities: 10/01/2020 01/16/2020  Hearing? N N  Vision? N N  Difficulty concentrating or making decisions? N N  Walking or climbing stairs? N N  Dressing or bathing? N N  Doing errands, shopping? N N  Preparing Food and eating ? N -  Using the Toilet? N -  In the past six months, have you accidently leaked urine? N -  Do you have problems with loss of bowel control? N -  Managing your Medications? N -  Managing your Finances? N -  Housekeeping or managing your Housekeeping? N -  Some recent data might be hidden    Patient Care Team: Colon Branch, MD as PCP - General (Internal Medicine) Pyrtle, Lajuan Lines, MD as Consulting Physician (Gastroenterology) Paulla Dolly Tamala Fothergill, DPM as Consulting Physician (Podiatry) Jolene Provost, PA-C as Physician Assistant (Otolaryngology) [place], Heather, [place] as Consulting Physician (Optometry)  Indicate any recent Medical Services you may have received from other than [facility] providers in the past year (date may be approximate).     Assessment:   This is a routine wellness examination for Jimmy Valentine.  Hearing/Vision screen Hearing Screening - Comments:: No issues Vision Screening - Comments:: Last eye  exam-06/2020-[facility]  Dietary issues and exercise activities discussed: Current Exercise Habits: Home exercise routine, Type of exercise: walking, Time (Minutes): 30, Frequency (Times/Week): 3, Weekly Exercise (Minutes/Week): 90, Intensity: Mild   Goals Addressed             This Visit's Progress    Patient Stated       Continue to eat healthy, drink water & walk,        Depression Screen PHQ 2/9 Scores 10/01/2020 07/18/2020 01/16/2020 12/08/2018 12/06/2017 11/24/2016 10/31/2015  PHQ - 2 Score 0 0 0 0 0 0 0    Fall Risk Fall Risk  10/01/2020 07/18/2020 06/07/2019 12/06/2017 11/24/2016  Falls in the past year? 0 0 0 0 No  Number falls in past yr: 0 0 0 - -  Injury with Fall? 0 0 0 - -  Follow up Falls prevention discussed - Falls evaluation completed Falls evaluation completed -    FALL RISK PREVENTION PERTAINING TO THE HOME:  Any stairs in or around the home? Yes  If so, are there any without handrails? No  Home free of loose throw rugs in walkways, pet beds, electrical cords, etc? Yes  Adequate lighting in your home to reduce risk of falls? Yes   ASSISTIVE DEVICES UTILIZED TO PREVENT FALLS:  Life alert? No  Use of a cane, walker or w/c? No  Grab bars in the bathroom? No  Shower chair or bench in shower? No  Elevated toilet seat or a handicapped toilet? No   TIMED UP AND GO:  Was the test performed? No . Phone visit   Cognitive Function:Normal cognitive status assessed by this Nurse Health Advisor. No abnormalities found.          Immunizations Immunization History  Administered Date(s) Administered   Fluad Quad(high Dose 65+) 11/03/2018   Influenza Split 12/04/2010, 11/19/2011   Influenza, High Dose Seasonal PF 10/31/2015, 11/24/2016, 11/04/2017, 11/16/2019   Influenza,inj,Quad PF,6+ Mos 10/12/2012, 10/13/2013, 10/18/2014   Moderna Sars-Covid-2 Vaccination 03/03/2019, 03/29/2019, 12/19/2019   Pneumococcal Conjugate-13 10/13/2013   Pneumococcal  Polysaccharide-23 04/11/2013   Td 11/03/2018   Tdap 10/12/2012  Zoster Recombinat (Shingrix) 07/21/2017, 09/21/2017   Zoster, Live 02/02/2010    TDAP status: Up to date  Flu Vaccine status: Due, Education has been provided regarding the importance of this vaccine. Advised may receive this vaccine at local pharmacy or Health Dept. Aware to provide a copy of the vaccination record if obtained from local pharmacy or Health Dept. Verbalized acceptance and understanding.  Pneumococcal vaccine status: Up to date  Covid-19 vaccine status: Information provided on how to obtain vaccines. 2nd booster due  Qualifies for Shingles Vaccine? No   Zostavax completed Yes   Shingrix Completed?: Yes  Screening Tests Health Maintenance  Topic Date Due   COVID-19 Vaccine (4 - Booster for Moderna series) 04/17/2020   INFLUENZA VACCINE  09/02/2020   FOOT EXAM  01/15/2021   HEMOGLOBIN A1C  01/17/2021   OPHTHALMOLOGY EXAM  07/11/2021   COLONOSCOPY (Pts 45-37yr Insurance coverage will need to be confirmed)  06/21/2024   TETANUS/TDAP  11/02/2028   Hepatitis C Screening  Completed   PNA vac Low Risk Adult  Completed   Zoster Vaccines- Shingrix  Completed   HPV VACCINES  Aged Out    Health Maintenance  Health Maintenance Due  Topic Date Due   COVID-19 Vaccine (4 - Booster for Moderna series) 04/17/2020   INFLUENZA VACCINE  09/02/2020    Colorectal cancer screening: Type of screening: Colonoscopy. Completed 06/22/2014. Repeat every 10 years  Lung Cancer Screening: (Low Dose CT Chest recommended if Age 72-80years, 30 pack-year currently smoking OR have quit w/in 15years.) does not qualify.     Additional Screening:  Hepatitis C Screening: Completed 10/18/2014  Vision Screening: Recommended annual ophthalmology exams for early detection of glaucoma and other disorders of the eye. Is the patient up to date with their annual eye exam?  Yes  Who is the provider or what is the name of the office  in which the patient [facility]   Dental Screening: Recommended annual dental exams for proper oral hygiene  Community Resource Referral / Chronic Care Management: CRR required this visit?  No   CCM required this visit?  No      Plan:     I have personally reviewed and noted the following in the patient's chart:   Medical and social history Use of alcohol, tobacco or illicit drugs  Current medications and supplements including opioid prescriptions. Patient is not currently taking opioid prescriptions. Functional ability and status Nutritional status Physical activity Advanced directives List of other physicians Hospitalizations, surgeries, and ER visits in previous 12 months Vitals Screenings to include cognitive, depression, and falls Referrals and appointments  In addition, I have reviewed and discussed with patient certain preventive protocols, quality metrics, and best practice recommendations. A written personalized care plan for preventive services as well as general preventive health recommendations were provided to patient.   Due to this being a telephonic visit, the after visit summary with patients personalized plan was offered to patient via mail or my-chart. Per request, patient was mailed a copy of [employer] LPN   [id number] Nurse Health Advisor  Nurse Notes: None   I have reviewed and agree with Health Coaches documentation.  JKathlene November MD

## 2020-10-01 NOTE — Patient Instructions (Signed)
[name] , Thank you for taking time to complete your Medicare Wellness Visit. I appreciate your ongoing commitment to your health goals. Please review the following plan we discussed and let me know if I can assist you in the future.   Screening recommendations/referrals: Colonoscopy: Completed 06/22/2014-Due 06/21/2024 Recommended yearly ophthalmology/optometry visit for glaucoma screening and checkup Recommended yearly dental visit for hygiene and checkup  Vaccinations: Influenza vaccine: Due 10/2020 Pneumococcal vaccine: Up to date Tdap vaccine: Up to date-Due-11/02/2028 Shingles vaccine: Completed vaccines   Covid-19: Booster due  Advanced directives: Please bring a copy for your chart  Conditions/risks identified: See problem list  Next appointment: Follow up in one year for your annual wellness visit.   Preventive Care 72 Years and Older, Male Preventive care refers to lifestyle choices and visits with your health care provider that can promote health and wellness. What does preventive care include? A yearly physical exam. This is also called an annual well check. Dental exams once or twice a year. Routine eye exams. Ask your health care provider how often you should have your eyes checked. Personal lifestyle choices, including: Daily care of your teeth and gums. Regular physical activity. Eating a healthy diet. Avoiding tobacco and drug use. Limiting alcohol use. Practicing safe sex. Taking low doses of aspirin every day. Taking vitamin and mineral supplements as recommended by your health care provider. What happens during an annual well check? The services and screenings done by your health care provider during your annual well check will depend on your age, overall health, lifestyle risk factors, and family history of disease. Counseling  Your health care provider may ask you questions about your: Alcohol use. Tobacco use. Drug use. Emotional well-being. Home and  relationship well-being. Sexual activity. Eating habits. History of falls. Memory and ability to understand (cognition). Work and work Statistician. Screening  You may have the following tests or measurements: Height, weight, and BMI. Blood pressure. Lipid and cholesterol levels. These may be checked every 5 years, or more frequently if you are over 65 years old. Skin check. Lung cancer screening. You may have this screening every year starting at age 45 if you have a 30-pack-year history of smoking and currently smoke or have quit within the past 15 years. Fecal occult blood test (FOBT) of the stool. You may have this test every year starting at age 67. Flexible sigmoidoscopy or colonoscopy. You may have a sigmoidoscopy every 5 years or a colonoscopy every 10 years starting at age 69. Prostate cancer screening. Recommendations will vary depending on your family history and other risks. Hepatitis C blood test. Hepatitis B blood test. Sexually transmitted disease (STD) testing. Diabetes screening. This is done by checking your blood sugar (glucose) after you have not eaten for a while (fasting). You may have this done every 1-3 years. Abdominal aortic aneurysm (AAA) screening. You may need this if you are a current or former smoker. Osteoporosis. You may be screened starting at age 23 if you are at high risk. Talk with your health care provider about your test results, treatment options, and if necessary, the need for more tests. Vaccines  Your health care provider may recommend certain vaccines, such as: Influenza vaccine. This is recommended every year. Tetanus, diphtheria, and acellular pertussis (Tdap, Td) vaccine. You may need a Td booster every 10 years. Zoster vaccine. You may need this after age 22. Pneumococcal 13-valent conjugate (PCV13) vaccine. One dose is recommended after age 12. Pneumococcal polysaccharide (PPSV23) vaccine. One dose is  recommended after age 34. Talk to your  health care provider about which screenings and vaccines you need and how often you need them. This information is not intended to replace advice given to you by your health care provider. Make sure you discuss any questions you have with your health care provider. Document Released: 02/15/2015 Document Revised: 10/09/2015 Document Reviewed: 11/20/2014 Elsevier Interactive Patient Education  2017 [place] Prevention in the Home Falls can cause injuries. They can happen to people of all ages. There are many things you can do to make your home safe and to help prevent falls. What can I do on the outside of my home? Regularly fix the edges of walkways and driveways and fix any cracks. Remove anything that might make you trip as you walk through a door, such as a raised step or threshold. Trim any bushes or trees on the path to your home. Use bright outdoor lighting. Clear any walking paths of anything that might make someone trip, such as rocks or tools. Regularly check to see if handrails are loose or broken. Make sure that both sides of any steps have handrails. Any raised decks and porches should have guardrails on the edges. Have any leaves, snow, or ice cleared regularly. Use sand or salt on walking paths during winter. Clean up any spills in your garage right away. This includes oil or grease spills. What can I do in the bathroom? Use night lights. Install grab bars by the toilet and in the tub and shower. Do not use towel bars as grab bars. Use non-skid mats or decals in the tub or shower. If you need to sit down in the shower, use a plastic, non-slip stool. Keep the floor dry. Clean up any water that spills on the floor as soon as it happens. Remove soap buildup in the tub or shower regularly. Attach bath mats securely with double-sided non-slip rug tape. Do not have throw rugs and other things on the floor that can make you trip. What can I do in the bedroom? Use night  lights. Make sure that you have a light by your bed that is easy to reach. Do not use any sheets or blankets that are too big for your bed. They should not hang down onto the floor. Have a firm chair that has side arms. You can use this for support while you get dressed. Do not have throw rugs and other things on the floor that can make you trip. What can I do in the kitchen? Clean up any spills right away. Avoid walking on wet floors. Keep items that you use a lot in easy-to-reach places. If you need to reach something above you, use a strong step stool that has a grab bar. Keep electrical cords out of the way. Do not use floor polish or wax that makes floors slippery. If you must use wax, use non-skid floor wax. Do not have throw rugs and other things on the floor that can make you trip. What can I do with my stairs? Do not leave any items on the stairs. Make sure that there are handrails on both sides of the stairs and use them. Fix handrails that are broken or loose. Make sure that handrails are as long as the stairways. Check any carpeting to make sure that it is firmly attached to the stairs. Fix any carpet that is loose or worn. Avoid having throw rugs at the top or bottom of the stairs. If  you do have throw rugs, attach them to the floor with carpet tape. Make sure that you have a light switch at the top of the stairs and the bottom of the stairs. If you do not have them, ask someone to add them for you. What else can I do to help prevent falls? Wear shoes that: Do not have high heels. Have rubber bottoms. Are comfortable and fit you well. Are closed at the toe. Do not wear sandals. If you use a stepladder: Make sure that it is fully opened. Do not climb a closed stepladder. Make sure that both sides of the stepladder are locked into place. Ask someone to hold it for you, if possible. Clearly mark and make sure that you can see: Any grab bars or handrails. First and last  steps. Where the edge of each step is. Use tools that help you move around (mobility aids) if they are needed. These include: Canes. Walkers. Scooters. Crutches. Turn on the lights when you go into a dark area. Replace any light bulbs as soon as they burn out. Set up your furniture so you have a clear path. Avoid moving your furniture around. If any of your floors are uneven, fix them. If there are any pets around you, be aware of where they are. Review your medicines with your doctor. Some medicines can make you feel dizzy. This can increase your chance of falling. Ask your doctor what other things that you can do to help prevent falls. This information is not intended to replace advice given to you by your health care provider. Make sure you discuss any questions you have with your health care provider. Document Released: 11/15/2008 Document Revised: 06/27/2015 Document Reviewed: 02/23/2014 Elsevier Interactive Patient Education  2017 [employer].

## 2020-10-03 ENCOUNTER — Other Ambulatory Visit: Payer: Self-pay | Admitting: Internal Medicine

## 2020-11-21 ENCOUNTER — Encounter: Payer: Self-pay | Admitting: Internal Medicine

## 2020-11-21 ENCOUNTER — Ambulatory Visit (INDEPENDENT_AMBULATORY_CARE_PROVIDER_SITE_OTHER): Payer: Medicare HMO | Admitting: Internal Medicine

## 2020-11-21 ENCOUNTER — Other Ambulatory Visit: Payer: Self-pay

## 2020-11-21 VITALS — BP 132/68 | HR 67 | Temp 98.4°F | Resp 18 | Ht 69.0 in | Wt 229.5 lb

## 2020-11-21 DIAGNOSIS — E785 Hyperlipidemia, unspecified: Secondary | ICD-10-CM

## 2020-11-21 DIAGNOSIS — I10 Essential (primary) hypertension: Secondary | ICD-10-CM

## 2020-11-21 DIAGNOSIS — E1169 Type 2 diabetes mellitus with other specified complication: Secondary | ICD-10-CM

## 2020-11-21 LAB — ALT: ALT: 51 U/L (ref 0–53)

## 2020-11-21 LAB — BASIC METABOLIC PANEL
BUN: 23 mg/dL (ref 6–23)
CO2: 28 mEq/L (ref 19–32)
Calcium: 10.1 mg/dL (ref 8.4–10.5)
Chloride: 92 mEq/L — ABNORMAL LOW (ref 96–112)
Creatinine, Ser: 1.14 mg/dL (ref 0.40–1.50)
GFR: 64.13 mL/min (ref >60.00)
Glucose, Bld: 171 mg/dL — ABNORMAL HIGH (ref 70–99)
Potassium: 4.6 mEq/L (ref 3.5–5.1)
Sodium: 130 mEq/L — ABNORMAL LOW (ref 135–145)

## 2020-11-21 LAB — AST: AST: 27 U/L (ref 0–37)

## 2020-11-21 LAB — HEMOGLOBIN A1C: Hgb A1c MFr Bld: 6.5 % (ref 4.6–6.5)

## 2020-11-21 NOTE — Progress Notes (Signed)
Subjective:    Patient ID: Jimmy Valentine, male    DOB: [birth year], 70 y.o.   MRN: [id number]  DOS:  11/21/2020 Type of visit - description: Follow-up  Today with talk about diabetes, hypertension, vaccinations. In general he feels well. Good lifestyle. Metformin was increased few months ago, good compliance and tolerance with no nausea or vomiting. Normal energy levels.  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 If you have any questions, ask your nurse or doctor.          STOP taking these medications    aspirin 81 MG tablet Stopped by: Kathlene November, MD       TAKE these medications    amLODipine 10 MG tablet Commonly known as: NORVASC Take 1 tablet (10 mg total) by mouth daily.   bisacodyl 5 MG EC tablet Commonly known as: DULCOLAX Take 5 mg by mouth once. For colon 5-20   colchicine 0.6 MG tablet Take 1 tablet (0.6 mg total) by mouth 2 (two) times daily as needed.   hydrochlorothiazide 25 MG tablet Commonly known as: HYDRODIURIL Take 1 tablet (25 mg total) by mouth daily.   metFORMIN 1000 MG tablet Commonly known as: GLUCOPHAGE Take 1 tablet (1,000 mg total) by mouth 2 (two) times daily with a meal.    sildenafil 20 MG tablet Commonly known as: REVATIO TAKE 3 TO 4 TABLETS BY MOUTH EVERY DAY AS NEEDED   simvastatin 40 MG tablet Commonly known as: ZOCOR Take 0.5 tablets (20 mg total) by mouth at bedtime.   valsartan 320 MG tablet Commonly known as: DIOVAN Take 1 tablet (320 mg total) by mouth daily.           Objective:   Physical Exam BP 132/68 (BP Location: Left Arm, Patient Position: Sitting, Cuff Size: Normal)   Pulse 67   Temp 98.4 F (36.9 C) (Oral)   Resp 18   Ht 5\' 9"  (1.753 m)   Wt 229 lb 8 oz (104.1 kg)   SpO2 95%   BMI 33.89 kg/m  General:   Well developed, NAD, BMI noted. HEENT:  Normocephalic . Face symmetric, atraumatic Lungs:  CTA B Normal respiratory effort, no intercostal retractions, no accessory muscle use. Heart: RRR,  no murmur.  Lower extremities: no pretibial edema bilaterally  Skin: Not pale. Not jaundice Neurologic:  alert & oriented X3.  Speech normal, gait appropriate for age and unassisted Psych--  Cognition and judgment appear intact.  Cooperative with normal attention span and concentration.  Behavior appropriate. No anxious or depressed appearing.      Assessment  Assessment DM, Started metformin 04-2015 HTN Hyperlipidemia Hypogonadism Dx few years ago by endocrinology, secondary hypogonadism. Was rx testosterone, did not notice any subjective difference while on meds. Currently on no HRT. DEXA 2014 normal  Erectile dysfunction Sleep apnea, mild, saw pulm 2014, CPAP was not mandatory, mostly to increase his QOL (see pulm note) DJD Gout Elevated LFTs x years, previous w/u (-) per pt ; hep B and C (-) 10-2014 Sees dermatology EKG 09/2017: RBBB, echo [facility].  PLAN: DM: Last A1c 7.3, metformin increased to 1000 mg twice daily.  He is also eating healthy, has increased his walking and is getting well-hydrated.  No side effects.  Check A1c, AST ALT.. HTN: BP is great, on amlodipine, HCTZ, Diovan.  Check BMP Aspirin: Okay to  stop per current literature. H/o snoring: No OSA sxs, good energy, feels well Preventive care: Had a flu shot, benefits of COVID booster discussed. Social: Retired, feeling well. RTC CPX in about 3 months.    This visit occurred during the SARS-CoV-2 public health emergency.  Safety protocols were in place, including screening questions prior to the visit, additional usage of staff PPE, and extensive cleaning of exam room while observing appropriate contact time as indicated for disinfecting solutions.

## 2020-11-21 NOTE — Assessment & Plan Note (Signed)
DM: Last A1c 7.3, metformin increased to 1000 mg twice daily.  He is also eating healthy, has increased his walking and is getting well-hydrated.  No side effects.  Check A1c, AST ALT.. HTN: BP is great, on amlodipine, HCTZ, Diovan.  Check BMP Aspirin: Okay to stop per current literature. H/o snoring: No OSA sxs, good energy, feels well Preventive care: Had a flu shot, benefits of COVID booster discussed. Social: Retired, feeling well. RTC CPX in about 3 months.

## 2020-11-21 NOTE — Patient Instructions (Addendum)
Okay to stop aspirin  Check the  blood pressure at home or at your pharmacy once or twice a month BP GOAL is between 110/65 and  135/85. If it is consistently higher or lower, let me know     GO TO THE LAB : Get the blood work     [place], [place] Come back for for physical by January or early February next year

## 2020-12-21 ENCOUNTER — Other Ambulatory Visit: Payer: Self-pay | Admitting: Internal Medicine

## 2021-01-10 ENCOUNTER — Other Ambulatory Visit: Payer: Self-pay | Admitting: Internal Medicine

## 2021-01-15 ENCOUNTER — Other Ambulatory Visit: Payer: Self-pay | Admitting: Internal Medicine

## 2021-01-15 DIAGNOSIS — H6243 Otitis externa in other diseases classified elsewhere, bilateral: Secondary | ICD-10-CM | POA: Diagnosis not present

## 2021-01-15 DIAGNOSIS — B369 Superficial mycosis, unspecified: Secondary | ICD-10-CM | POA: Diagnosis not present

## 2021-01-21 ENCOUNTER — Encounter: Payer: Medicare HMO | Admitting: Internal Medicine

## 2021-02-26 ENCOUNTER — Ambulatory Visit (INDEPENDENT_AMBULATORY_CARE_PROVIDER_SITE_OTHER): Payer: Medicare HMO | Admitting: Internal Medicine

## 2021-02-26 ENCOUNTER — Encounter: Payer: Self-pay | Admitting: Internal Medicine

## 2021-02-26 VITALS — BP 136/78 | HR 78 | Temp 98.3°F | Resp 18 | Ht 69.0 in | Wt 225.4 lb

## 2021-02-26 DIAGNOSIS — Z0000 Encounter for general adult medical examination without abnormal findings: Secondary | ICD-10-CM | POA: Diagnosis not present

## 2021-02-26 DIAGNOSIS — I10 Essential (primary) hypertension: Secondary | ICD-10-CM

## 2021-02-26 DIAGNOSIS — E1169 Type 2 diabetes mellitus with other specified complication: Secondary | ICD-10-CM

## 2021-02-26 DIAGNOSIS — Z23 Encounter for immunization: Secondary | ICD-10-CM

## 2021-02-26 DIAGNOSIS — E785 Hyperlipidemia, unspecified: Secondary | ICD-10-CM | POA: Diagnosis not present

## 2021-02-26 LAB — LIPID PANEL
Cholesterol: 156 mg/dL (ref 0–200)
HDL: 44.3 mg/dL (ref >39.00)
LDL Cholesterol: 88 mg/dL (ref 0–99)
NonHDL: 111.93
Total CHOL/HDL Ratio: 4
Triglycerides: 119 mg/dL (ref 0.0–149.0)
VLDL: 23.8 mg/dL (ref 0.0–40.0)

## 2021-02-26 LAB — COMPREHENSIVE METABOLIC PANEL
ALT: 47 U/L (ref 0–53)
AST: 30 U/L (ref 0–37)
Albumin: 4.6 g/dL (ref 3.5–5.2)
Alkaline Phosphatase: 83 U/L (ref 39–117)
BUN: 16 mg/dL (ref 6–23)
CO2: 28 mEq/L (ref 19–32)
Calcium: 9.7 mg/dL (ref 8.4–10.5)
Chloride: 94 mEq/L — ABNORMAL LOW (ref 96–112)
Creatinine, Ser: 1.05 mg/dL (ref 0.40–1.50)
GFR: 70.65 mL/min (ref >60.00)
Glucose, Bld: 147 mg/dL — ABNORMAL HIGH (ref 70–99)
Potassium: 4.2 mEq/L (ref 3.5–5.1)
Sodium: 132 mEq/L — ABNORMAL LOW (ref 135–145)
Total Bilirubin: 0.8 mg/dL (ref 0.2–1.2)
Total Protein: 7.4 g/dL (ref 6.0–8.3)

## 2021-02-26 LAB — CBC WITH DIFFERENTIAL/PLATELET
Basophils Absolute: 0.1 10*3/uL (ref 0.0–0.1)
Basophils Relative: 0.8 % (ref 0.0–3.0)
Eosinophils Absolute: 0.3 10*3/uL (ref 0.0–0.7)
Eosinophils Relative: 3.6 % (ref 0.0–5.0)
HCT: 46.8 % (ref 39.0–52.0)
Hemoglobin: 16.1 g/dL (ref 13.0–17.0)
Lymphocytes Relative: 21 % (ref 12.0–46.0)
Lymphs Abs: 1.9 10*3/uL (ref 0.7–4.0)
MCHC: 34.3 g/dL (ref 30.0–36.0)
MCV: 95.1 fl (ref 78.0–100.0)
Monocytes Absolute: 0.8 10*3/uL (ref 0.1–1.0)
Monocytes Relative: 9.2 % (ref 3.0–12.0)
Neutro Abs: 5.8 10*3/uL (ref 1.4–7.7)
Neutrophils Relative %: 65.4 % (ref 43.0–77.0)
Platelets: 276 10*3/uL (ref 150.0–400.0)
RBC: 4.93 Mil/uL (ref 4.22–5.81)
RDW: 13.2 % (ref 11.5–15.5)
WBC: 8.9 10*3/uL (ref 4.0–10.5)

## 2021-02-26 LAB — HEMOGLOBIN A1C: Hgb A1c MFr Bld: 6.8 % — ABNORMAL HIGH (ref 4.6–6.5)

## 2021-02-26 LAB — TSH: TSH: 2.59 u[IU]/mL (ref 0.35–5.50)

## 2021-02-26 NOTE — Assessment & Plan Note (Signed)
-  TD 2014 - s/p zostavax and shingrex - PNM 23: 04/21/13; prevnar 9/11/ 2015; PNM 20: Today - shingrix x 2  - covid vax: Bivalent booster recommended - had a  flu shot   -CCS: Cscope at age 71, normal, Dr Cher Nakai. Cscope 2016 normal, Dr Hilarie Fredrickson, 10 years -prostate ca screening: pro-cons discussed, elected no further screening. -Labs: CMP, FLP, CBC, A1c, TSH -Lifestyle: Patient retired, he is enjoying it, encouraged to remain physically active and engage in mental activities. -ACP discussed

## 2021-02-26 NOTE — Patient Instructions (Addendum)
Check the  blood pressure regularly BP GOAL is between 110/65 and  135/85. If it is consistently higher or lower, let me know   GO TO THE LAB : Get the blood work     [place], [place] back for   a checkup in 6 months    Please bring back or mail your advance care documents

## 2021-02-26 NOTE — Progress Notes (Signed)
Subjective:    Patient ID: Jimmy Valentine, male    DOB: [birth year], 71 y.o.   MRN: [id number]  DOS:  02/26/2021 Type of visit - description: cpx  Since the last visit is doing well.  Has no major concerns. Did have a cold starting Christmas, it lasted several days but is gradually getting better.  Currently with no fever, no  chest pain or difficulty breathing  Review of Systems  Other than above, a 14 point review of systems is negative      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,had a MRI, was prescribed testosterone by endocrinology   Sleep apnea    does't use cpap   Sudden hearing loss, left 2017   idiopathic sensorineural hearing loss    Past Surgical History:  Procedure Laterality Date   TOTAL KNEE ARTHROPLASTY Right 2005   TOTAL KNEE ARTHROPLASTY  12/04/2011   LEFT-- TOTAL KNEE ARTHROPLASTY;  Surgeon: Sydnee Cabal, MD;  Location: [facility];  Service: Orthopedics;  Laterality: Left;   Social History   Socioeconomic History   Marital status: Widowed    Spouse name: married   Number of children: 0   Years of education: Not on file   Highest education level: Not on file  Occupational History   Occupation: fully retired-sales rep  Tobacco Use   Smoking status: Former    Packs/day: 1.00    Years: 15.00    Pack years: 15.00    Types: Cigarettes    Quit date: 12/04/1995    Years since quitting: 25.2   Smokeless tobacco: Never   Tobacco comments:    occ cigars   Substance and Sexual Activity   Alcohol use: Yes    Alcohol/week: 12.0 standard drinks    Types: 12 Standard drinks or equivalent per week    Comment: social drinker   Drug use: No   Sexual activity: Not Currently  Other Topics Concern   Not on file  Social History Narrative   Lost first wife remotely.   Widow, lost  2nd wife 08-03-16   Lives alone, no biological children   2 step children ([place], [place])  from second wife , still very close to them      Social Determinants of Radio broadcast assistant Strain: Low Risk    Difficulty of Paying Living Expenses: Not hard at all  Food Insecurity: No Food Insecurity   Worried About Charity fundraiser in the Last Year: Never true   Arboriculturist in the Last Year: Never true  Transportation Needs: No Transportation Needs   Lack of Transportation (Medical): No   Lack of Transportation (Non-Medical): No  Physical Activity: Insufficiently Active   Days of Exercise per Week: 3 days   Minutes of Exercise per Session: 30 min  Stress: No Stress Concern Present   Feeling of Stress : Not at all  Social Connections: Socially Isolated   Frequency of Communication with Friends and Family: More than three times a week   Frequency of Social Gatherings with Friends and Family: More than three times a week   Attends Religious Services: Never   Marine scientist or Organizations: No   Attends Archivist Meetings: Never   Marital Status: Widowed  Intimate Partner Violence: Not At Risk   Fear of Current or Ex-Partner: No   Emotionally Abused: No   Physically Abused: No   Sexually Abused: No     Current Outpatient Medications  Medication Instructions   amLODipine (NORVASC) 10 MG tablet TAKE 1 TABLET BY MOUTH EVERY DAY   bisacodyl (DULCOLAX) 5 mg, Oral,  Once, For colon 5-20    colchicine 0.6 mg, Oral, 2 times daily PRN   hydrochlorothiazide (HYDRODIURIL) 25 mg, Oral, Daily   metFORMIN (GLUCOPHAGE) 1,000 mg, Oral, 2 times daily with meals   sildenafil (REVATIO) 20 MG tablet TAKE 3 TO 4 TABLETS BY MOUTH EVERY DAY AS NEEDED   simvastatin (ZOCOR) 20 mg, Oral, Daily at bedtime   valsartan (DIOVAN) 320 MG tablet TAKE 1 TABLET BY MOUTH EVERY DAY       Objective:   Physical Exam BP 136/78 (BP Location: Left Arm, Patient Position: Sitting, Cuff  Size: Normal)    Pulse 78    Temp 98.3 F (36.8 C) (Oral)    Resp 18    Ht 5\' 9"  (1.753 m)    Wt 225 lb 6 oz (102.2 kg)    SpO2 96%    BMI 33.28 kg/m  General: Well developed, NAD, BMI noted Neck: No  thyromegaly  HEENT:  Normocephalic . Face symmetric, atraumatic Lungs:  CTA B Normal respiratory effort, no intercostal retractions, no accessory muscle use. Heart: RRR,  no murmur.  Abdomen:  Not distended, soft, non-tender. No rebound or rigidity.   DM foot exam: No edema, good pedal pulses, pinprick examination normal Skin: Exposed areas without rash. Not pale. Not jaundice Neurologic:  alert & oriented X3.  Speech normal, gait appropriate for age and unassisted Strength symmetric and appropriate for age.  Psych: Cognition and judgment appear intact.  Cooperative with normal attention span and concentration.  Behavior appropriate. No anxious or depressed appearing. DM foot exam:    Assessment      Assessment DM, Started metformin 04-2015 HTN Hyperlipidemia Hypogonadism Dx few years ago by endocrinology, secondary hypogonadism. Was rx testosterone, did not notice any subjective difference while on meds. Currently on no HRT. DEXA 2014 normal  Erectile dysfunction Sleep apnea, mild, saw pulm 2014, CPAP was not mandatory, mostly to increase his QOL (see pulm note) DJD Gout Elevated LFTs x years, previous w/u (-) per pt ; hep B and C (-) 10-2014 Sees dermatology prn EKG 09/2017: RBBB, echo [facility].  PLAN: Here for CPX DM: On metformin, check A1c. HTN: BP is very good, encouraged ambulatory BPs, continue Norvasc/HCTZ-Diovan.  Checking labs. Hyperlipidemia: On simvastatin, labs. Gout: Hardly ever has issues.  On colchicine as needed RTC 6 months     This visit occurred during the SARS-CoV-2 public health emergency.  Safety protocols were in place, including screening questions prior to the visit, additional usage of staff PPE, and extensive cleaning of exam room while  observing appropriate contact time as indicated for disinfecting solutions.

## 2021-02-26 NOTE — Assessment & Plan Note (Signed)
Here for CPX DM: On metformin, check A1c. HTN: BP is very good, encouraged ambulatory BPs, continue Norvasc/HCTZ-Diovan.  Checking labs. Hyperlipidemia: On simvastatin, labs. Gout: Hardly ever has issues.  On colchicine as needed RTC 6 months

## 2021-03-25 DIAGNOSIS — L570 Actinic keratosis: Secondary | ICD-10-CM | POA: Diagnosis not present

## 2021-03-25 DIAGNOSIS — D485 Neoplasm of uncertain behavior of skin: Secondary | ICD-10-CM | POA: Diagnosis not present

## 2021-03-25 DIAGNOSIS — L723 Sebaceous cyst: Secondary | ICD-10-CM | POA: Diagnosis not present

## 2021-07-03 ENCOUNTER — Other Ambulatory Visit: Payer: Self-pay | Admitting: Internal Medicine

## 2021-07-11 ENCOUNTER — Other Ambulatory Visit: Payer: Self-pay | Admitting: Internal Medicine

## 2021-07-23 ENCOUNTER — Other Ambulatory Visit: Payer: Self-pay | Admitting: Internal Medicine

## 2021-07-23 ENCOUNTER — Telehealth: Payer: Self-pay

## 2021-07-23 MED ORDER — AMLODIPINE BESYLATE 10 MG PO TABS: 10.0000 mg | ORAL_TABLET | Freq: Every day | ORAL | 1 refills | Status: DC

## 2021-07-23 MED ORDER — VALSARTAN 320 MG PO TABS: 320.0000 mg | ORAL_TABLET | Freq: Every day | ORAL | 1 refills | Status: DC

## 2021-07-23 NOTE — Telephone Encounter (Signed)
Pt requesting refills of blood pressure medications. Amlodipine 10 MG and Valsartan 320 MG. Refills sent to [facility] on [address]

## 2021-08-19 ENCOUNTER — Ambulatory Visit (INDEPENDENT_AMBULATORY_CARE_PROVIDER_SITE_OTHER): Payer: Medicare HMO | Admitting: Internal Medicine

## 2021-08-19 ENCOUNTER — Encounter: Payer: Self-pay | Admitting: Internal Medicine

## 2021-08-19 VITALS — BP 124/76 | HR 77 | Temp 98.4°F | Resp 16 | Ht 69.0 in | Wt 224.5 lb

## 2021-08-19 DIAGNOSIS — I10 Essential (primary) hypertension: Secondary | ICD-10-CM

## 2021-08-19 DIAGNOSIS — E291 Testicular hypofunction: Secondary | ICD-10-CM

## 2021-08-19 DIAGNOSIS — E1169 Type 2 diabetes mellitus with other specified complication: Secondary | ICD-10-CM | POA: Diagnosis not present

## 2021-08-19 LAB — BASIC METABOLIC PANEL
BUN: 17 mg/dL (ref 6–23)
CO2: 27 mEq/L (ref 19–32)
Calcium: 10 mg/dL (ref 8.4–10.5)
Chloride: 94 mEq/L — ABNORMAL LOW (ref 96–112)
Creatinine, Ser: 1.01 mg/dL (ref 0.40–1.50)
GFR: 73.78 mL/min (ref >60.00)
Glucose, Bld: 164 mg/dL — ABNORMAL HIGH (ref 70–99)
Potassium: 4.7 mEq/L (ref 3.5–5.1)
Sodium: 131 mEq/L — ABNORMAL LOW (ref 135–145)

## 2021-08-19 LAB — HEMOGLOBIN A1C: Hgb A1c MFr Bld: 6.9 % — ABNORMAL HIGH (ref 4.6–6.5)

## 2021-08-19 NOTE — Assessment & Plan Note (Signed)
DM: On metformin, he remains active.  Check A1c HTN: On amlodipine, HCTZ, Diovan.  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  Check a BMP. Hypogonadism: Remote history of, not on HRT for years, no apparent symptoms, check a DEXA OSA: Diagnosed remotely, CPAP was not felt to be mandatory.  he seems to be doing well. Preventive care: Benefits of [place] discussed. RTC CPX 02-2022

## 2021-08-19 NOTE — Patient Instructions (Addendum)
Recommend to proceed with covid booster (bivalent) at your pharmacy. Flu shot this fall.  Per our records you are due for your diabetic eye exam. Please contact your eye doctor to schedule an appointment. Please have them send copies of your office visit notes to [place]. Our fax number is [phone]. If you need a referral to an eye doctor please let us know.   We will schedule a bone density test  Check the  blood pressure regularly BP GOAL is between 110/65 and  135/85. If it is consistently higher or lower, let me know    GO TO THE LAB : Get the blood work     [place], [place] back for   a physical exam by 02-2022

## 2021-08-19 NOTE — Progress Notes (Signed)
Subjective:    Patient ID: Jimmy Valentine, male    DOB: [birth year], 71 y.o.   MRN: [id number]  DOS:  08/19/2021 Type of visit - description: f/u  Since the last office visit is doing well. History of OSA, unknown if he is snoring at this point, he sleeps alone. Denies any morning headaches, energy levels are pretty good.  He is still physically active  Review of Systems Denies any lower extremity paresthesias  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,had a MRI, was prescribed testosterone by endocrinology   Sleep apnea    does't use cpap   Sudden hearing loss, left 2017   idiopathic sensorineural hearing loss    Past Surgical History:  Procedure Laterality Date   TOTAL KNEE ARTHROPLASTY Right 2005   TOTAL KNEE ARTHROPLASTY  12/04/2011   LEFT-- TOTAL KNEE ARTHROPLASTY;  Surgeon: Sydnee Cabal, MD;  Location: [facility];  Service: Orthopedics;  Laterality: Left;    Current Outpatient Medications  Medication Instructions   amLODipine (NORVASC) 10 mg, Oral, Daily   bisacodyl (DULCOLAX) 5 mg,  Once   colchicine 0.6 mg, Oral, 2 times daily PRN   hydrochlorothiazide (HYDRODIURIL) 25 mg, Oral, Daily   metFORMIN (GLUCOPHAGE) 1000 MG tablet TAKE 1 TABLET (1,000 MG TOTAL) BY MOUTH TWICE A DAY WITH FOOD   sildenafil (REVATIO) 20 MG tablet TAKE 3 TO 4 TABLETS BY MOUTH EVERY DAY AS NEEDED   simvastatin (ZOCOR) 20 mg, Oral, Daily at bedtime   valsartan (DIOVAN) 320 mg, Oral, Daily       Objective:   Physical Exam BP 124/76   Pulse 77   Temp 98.4 F (36.9 C) (Oral)   Resp 16   Ht '5\' 9"'$  (1.753 m)   Wt 224 lb 8 oz (101.8 kg)   SpO2 96%   BMI 33.15 kg/m  General:   Well developed, NAD, BMI noted. HEENT:  Normocephalic . Face symmetric, atraumatic Lungs:  CTA B Normal respiratory effort, no  intercostal retractions, no accessory muscle use. Heart: RRR,  no murmur.  DM foot exam: No edema, good pedal pulses, pinprick examination normal Skin: Not pale. Not jaundice Neurologic:  alert & oriented X3.  Speech normal, gait appropriate for age and unassisted Psych--  Cognition and judgment appear intact.  Cooperative with normal attention span and concentration.  Behavior appropriate. No anxious or depressed appearing.      Assessment    Assessment DM, Started metformin 04-2015 HTN Hyperlipidemia Hypogonadism Dx few years ago by endocrinology, secondary hypogonadism. Was rx testosterone, did not notice any subjective difference while on meds. Currently on no HRT. DEXA 2014 normal  Erectile dysfunction Sleep apnea, mild, saw pulm 2014, CPAP was not mandatory, mostly to increase his QOL (see pulm note) DJD Gout Elevated LFTs x years, previous w/u (-) per pt ; hep B and C (-) 10-2014 Sees dermatology prn EKG 09/2017: RBBB, echo [facility].  PLAN: DM: On metformin, he remains active.  Check A1c HTN: On amlodipine, HCTZ, Diovan.  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  Check a BMP. Hypogonadism: Remote history of, not on HRT for years, no apparent symptoms, check a DEXA OSA: Diagnosed remotely, CPAP was not felt to be mandatory.  he seems to be doing well. Preventive care: Benefits  of [place] discussed. RTC CPX 02-2022

## 2021-08-26 DIAGNOSIS — E119 Type 2 diabetes mellitus without complications: Secondary | ICD-10-CM | POA: Diagnosis not present

## 2021-08-26 LAB — HM DIABETES EYE EXAM

## 2021-08-27 ENCOUNTER — Other Ambulatory Visit: Payer: Self-pay | Admitting: Internal Medicine

## 2021-08-28 ENCOUNTER — Ambulatory Visit (HOSPITAL_BASED_OUTPATIENT_CLINIC_OR_DEPARTMENT_OTHER)
Admission: RE | Admit: 2021-08-28 | Discharge: 2021-08-28 | Disposition: A | Discharge status: Home / Self Care | Payer: Medicare HMO | Source: Ambulatory Visit | Attending: Internal Medicine | Admitting: Internal Medicine

## 2021-08-28 ENCOUNTER — Other Ambulatory Visit (HOSPITAL_BASED_OUTPATIENT_CLINIC_OR_DEPARTMENT_OTHER): Payer: Medicare HMO

## 2021-08-28 DIAGNOSIS — E119 Type 2 diabetes mellitus without complications: Secondary | ICD-10-CM | POA: Diagnosis not present

## 2021-08-28 DIAGNOSIS — Z13820 Encounter for screening for osteoporosis: Secondary | ICD-10-CM | POA: Diagnosis not present

## 2021-08-28 DIAGNOSIS — Z87891 Personal history of nicotine dependence: Secondary | ICD-10-CM | POA: Diagnosis not present

## 2021-08-28 DIAGNOSIS — E291 Testicular hypofunction: Secondary | ICD-10-CM | POA: Diagnosis not present

## 2021-08-28 DIAGNOSIS — Z0389 Encounter for observation for other suspected diseases and conditions ruled out: Secondary | ICD-10-CM | POA: Diagnosis not present

## 2021-09-01 ENCOUNTER — Encounter: Payer: Self-pay | Admitting: Internal Medicine

## 2021-10-14 DIAGNOSIS — H6123 Impacted cerumen, bilateral: Secondary | ICD-10-CM | POA: Diagnosis not present

## 2021-12-24 ENCOUNTER — Other Ambulatory Visit: Payer: Self-pay | Admitting: Internal Medicine

## 2021-12-31 ENCOUNTER — Other Ambulatory Visit: Payer: Self-pay | Admitting: Internal Medicine

## 2022-01-15 ENCOUNTER — Telehealth: Payer: Self-pay

## 2022-01-15 ENCOUNTER — Other Ambulatory Visit: Payer: Self-pay | Admitting: Internal Medicine

## 2022-01-15 NOTE — Telephone Encounter (Signed)
PA initiated via Covermymeds; KEY: [id number]. Awaiting determination.

## 2022-01-15 NOTE — Telephone Encounter (Signed)
PA denied.   Plan does not cover medications for erectile dysfunction.

## 2022-01-17 ENCOUNTER — Other Ambulatory Visit: Payer: Self-pay | Admitting: Internal Medicine

## 2022-01-23 DIAGNOSIS — Z681 Body mass index (BMI) 19 or less, adult: Secondary | ICD-10-CM | POA: Diagnosis not present

## 2022-01-23 DIAGNOSIS — E119 Type 2 diabetes mellitus without complications: Secondary | ICD-10-CM | POA: Diagnosis not present

## 2022-01-23 DIAGNOSIS — I10 Essential (primary) hypertension: Secondary | ICD-10-CM | POA: Diagnosis not present

## 2022-01-23 DIAGNOSIS — E785 Hyperlipidemia, unspecified: Secondary | ICD-10-CM | POA: Diagnosis not present

## 2022-01-23 DIAGNOSIS — M109 Gout, unspecified: Secondary | ICD-10-CM | POA: Diagnosis not present

## 2022-01-23 DIAGNOSIS — Z87891 Personal history of nicotine dependence: Secondary | ICD-10-CM | POA: Diagnosis not present

## 2022-01-23 DIAGNOSIS — Z7984 Long term (current) use of oral hypoglycemic drugs: Secondary | ICD-10-CM | POA: Diagnosis not present

## 2022-01-23 DIAGNOSIS — R636 Underweight: Secondary | ICD-10-CM | POA: Diagnosis not present

## 2022-01-31 ENCOUNTER — Other Ambulatory Visit: Payer: Self-pay | Admitting: Internal Medicine

## 2022-02-24 ENCOUNTER — Other Ambulatory Visit: Payer: Self-pay | Admitting: Internal Medicine

## 2022-03-03 ENCOUNTER — Ambulatory Visit (INDEPENDENT_AMBULATORY_CARE_PROVIDER_SITE_OTHER): Payer: Medicare HMO | Admitting: Internal Medicine

## 2022-03-03 ENCOUNTER — Encounter: Payer: Self-pay | Admitting: Internal Medicine

## 2022-03-03 VITALS — BP 136/68 | HR 80 | Temp 98.0°F | Resp 16 | Ht 69.0 in | Wt 217.5 lb

## 2022-03-03 DIAGNOSIS — E785 Hyperlipidemia, unspecified: Secondary | ICD-10-CM | POA: Diagnosis not present

## 2022-03-03 DIAGNOSIS — Z0000 Encounter for general adult medical examination without abnormal findings: Secondary | ICD-10-CM | POA: Diagnosis not present

## 2022-03-03 DIAGNOSIS — E1169 Type 2 diabetes mellitus with other specified complication: Secondary | ICD-10-CM

## 2022-03-03 DIAGNOSIS — I10 Essential (primary) hypertension: Secondary | ICD-10-CM | POA: Diagnosis not present

## 2022-03-03 LAB — CBC WITH DIFFERENTIAL/PLATELET
Basophils Absolute: 0 10*3/uL (ref 0.0–0.1)
Basophils Relative: 0.4 % (ref 0.0–3.0)
Eosinophils Absolute: 0.3 10*3/uL (ref 0.0–0.7)
Eosinophils Relative: 3.3 % (ref 0.0–5.0)
HCT: 47.4 % (ref 39.0–52.0)
Hemoglobin: 16.8 g/dL (ref 13.0–17.0)
Lymphocytes Relative: 26.4 % (ref 12.0–46.0)
Lymphs Abs: 2.2 10*3/uL (ref 0.7–4.0)
MCHC: 35.5 g/dL (ref 30.0–36.0)
MCV: 94.2 fl (ref 78.0–100.0)
Monocytes Absolute: 0.7 10*3/uL (ref 0.1–1.0)
Monocytes Relative: 8.6 % (ref 3.0–12.0)
Neutro Abs: 5.2 10*3/uL (ref 1.4–7.7)
Neutrophils Relative %: 61.3 % (ref 43.0–77.0)
Platelets: 280 10*3/uL (ref 150.0–400.0)
RBC: 5.03 Mil/uL (ref 4.22–5.81)
RDW: 12.8 % (ref 11.5–15.5)
WBC: 8.5 10*3/uL (ref 4.0–10.5)

## 2022-03-03 LAB — LIPID PANEL
Cholesterol: 159 mg/dL (ref 0–200)
HDL: 37.1 mg/dL — ABNORMAL LOW (ref >39.00)
LDL Cholesterol: 91 mg/dL (ref 0–99)
NonHDL: 121.87
Total CHOL/HDL Ratio: 4
Triglycerides: 154 mg/dL — ABNORMAL HIGH (ref 0.0–149.0)
VLDL: 30.8 mg/dL (ref 0.0–40.0)

## 2022-03-03 LAB — MICROALBUMIN / CREATININE URINE RATIO
Creatinine,U: 94.9 mg/dL
Microalb Creat Ratio: 2.8 mg/g (ref 0.0–30.0)
Microalb, Ur: 2.7 mg/dL — ABNORMAL HIGH (ref 0.0–1.9)

## 2022-03-03 LAB — COMPREHENSIVE METABOLIC PANEL
ALT: 33 U/L (ref 0–53)
AST: 18 U/L (ref 0–37)
Albumin: 4.5 g/dL (ref 3.5–5.2)
Alkaline Phosphatase: 87 U/L (ref 39–117)
BUN: 22 mg/dL (ref 6–23)
CO2: 28 mEq/L (ref 19–32)
Calcium: 9.9 mg/dL (ref 8.4–10.5)
Chloride: 97 mEq/L (ref 96–112)
Creatinine, Ser: 0.99 mg/dL (ref 0.40–1.50)
GFR: 75.28 mL/min (ref >60.00)
Glucose, Bld: 144 mg/dL — ABNORMAL HIGH (ref 70–99)
Potassium: 4.8 mEq/L (ref 3.5–5.1)
Sodium: 135 mEq/L (ref 135–145)
Total Bilirubin: 0.9 mg/dL (ref 0.2–1.2)
Total Protein: 7 g/dL (ref 6.0–8.3)

## 2022-03-03 LAB — HEMOGLOBIN A1C: Hgb A1c MFr Bld: 6.7 % — ABNORMAL HIGH (ref 4.6–6.5)

## 2022-03-03 NOTE — Patient Instructions (Addendum)
It was good to see you today.  Please read information about fall prevention on the last page   Vaccines I recommend:  Covid booster RSV vaccine  Check the  blood pressure regularly BP GOAL is between 110/65 and  135/85. If it is consistently higher or lower, let me know    GO TO THE LAB : Get the blood work     [place], [place] back for   a checkup in 6 months    "[place] of attorney" ,  "Living will" (Advance care planning documents)  If you already have a living will or healthcare power of attorney, is recommended you bring the copy to be scanned in your chart.   The document will be available to all the doctors you see in the system.  Advance care planning is a process that supports adults in  understanding and sharing their preferences regarding future medical care.  The patient's preferences are recorded in documents called Advance Directives and the can be modified at any time while the patient is in full mental capacity.   If you don't have one, please consider create one.      More information at: [email or web address]

## 2022-03-03 NOTE — Progress Notes (Unsigned)
Subjective:    Patient ID: Jimmy Valentine, male    DOB: 10-03-1950, 72 y.o.   MRN: [id number]  DOS:  03/03/2022 Type of visit - description: CPX  Here for CPX. In general feeling well. Still takes walks regularly.  Has improved his diet, + weight loss. Had a mechanical fall few months ago, no major consequences.  Wt Readings from Last 3 Encounters:  03/03/22 217 lb 8 oz (98.7 kg)  08/19/21 224 lb 8 oz (101.8 kg)  02/26/21 225 lb 6 oz (102.2 kg)   Review of Systems  Other than above, a 14 point review of systems is negative    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,had a MRI, was prescribed testosterone by endocrinology   Sleep apnea    does't use cpap   Sudden hearing loss, left 2017   idiopathic sensorineural hearing loss    Past Surgical History:  Procedure Laterality Date   TOTAL KNEE ARTHROPLASTY Right 2005   TOTAL KNEE ARTHROPLASTY  12/04/2011   LEFT-- TOTAL KNEE ARTHROPLASTY;  Surgeon: Sydnee Cabal, MD;  Location: [facility];  Service: Orthopedics;  Laterality: Left;   Social History   Socioeconomic History   Marital status: Widowed    Spouse name: married   Number of children: 0   Years of education: Not on file   Highest education level: Not on file  Occupational History   Occupation: fully retired-sales rep  Tobacco Use   Smoking status: Former    Packs/day: 1.00    Years: 15.00    Total pack years: 15.00    Types: Cigarettes    Quit date: 12/04/1995    Years since quitting: 26.2   Smokeless tobacco: Never   Tobacco comments:    occ cigars   Substance and Sexual Activity   Alcohol use: Yes    Alcohol/week: 12.0 standard drinks of alcohol    Types: 12 Standard drinks or equivalent per week    Comment: social drinker   Drug use: No   Sexual activity: Not Currently   Other Topics Concern   Not on file  Social History Narrative   Lost first wife remotely.   Widow, lost 2nd wife 08-03-16   Lives alone, no biological children   2 step children ([place], [place])  from second wife , still very close to them      Social Determinants of Health   Financial Resource Strain: Low Risk  (10/01/2020)   Overall Financial Resource Strain (CARDIA)    Difficulty of Paying Living Expenses: Not hard at all  Food Insecurity: No Food Insecurity (10/01/2020)   Hunger Vital Sign    Worried About Running Out of Food in the Last Year: Never true    [place] in the Last Year: Never true  Transportation Needs: No Transportation Needs (10/01/2020)   PRAPARE - Hydrologist (Medical): No    Lack of Transportation (Non-Medical): No  Physical Activity: Insufficiently Active (10/01/2020)   Exercise Vital Sign    Days of Exercise per Week: 3 days    Minutes of Exercise per Session: 30 min  Stress: No Stress Concern Present (10/01/2020)   [place]    Feeling of  Stress : Not at all  Social Connections: Socially Isolated (10/01/2020)   Social Connection and Isolation Panel [NHANES]    Frequency of Communication with Friends and Family: More than three times a week    Frequency of Social Gatherings with Friends and Family: More than three times a week    Attends Religious Services: Never    Marine scientist or Organizations: No    Attends Archivist Meetings: Never    Marital Status: Widowed  Intimate Partner Violence: Not At Risk (10/01/2020)   Humiliation, Afraid, Rape, and Kick questionnaire    Fear of Current or Ex-Partner: No    Emotionally Abused: No    Physically Abused: No    Sexually Abused: No     Current Outpatient Medications  Medication Instructions   amLODipine (NORVASC) 10 mg, Oral, Daily   bisacodyl (DULCOLAX) 5 mg,  Once   colchicine 0.6 mg,  Oral, 2 times daily PRN   hydrochlorothiazide (HYDRODIURIL) 25 mg, Oral, Daily   metFORMIN (GLUCOPHAGE) 1,000 mg, Oral, 2 times daily with meals   sildenafil (REVATIO) 20 MG tablet TAKE 3 TO 4 TABLETS BY MOUTH EVERY DAY AS NEEDED   simvastatin (ZOCOR) 20 mg, Oral, Daily at bedtime   valsartan (DIOVAN) 320 mg, Oral, Daily       Objective:   Physical Exam BP 136/68   Pulse 80   Temp 98 F (36.7 C) (Oral)   Resp 16   Ht '5\' 9"'$  (1.753 m)   Wt 217 lb 8 oz (98.7 kg)   SpO2 98%   BMI 32.12 kg/m  General: Well developed, NAD, BMI noted Neck: No  thyromegaly  HEENT:  Normocephalic . Face symmetric, atraumatic Lungs:  CTA B Normal respiratory effort, no intercostal retractions, no accessory muscle use. Heart: RRR,  no murmur.  Abdomen:  Not distended, soft, non-tender. No rebound or rigidity.   Lower extremities: no pretibial edema bilaterally  Skin: Exposed areas without rash. Not pale. Not jaundice Neurologic:  alert & oriented X3.  Speech normal, gait appropriate for age and unassisted Strength symmetric and appropriate for age.  Psych: Cognition and judgment appear intact.  Cooperative with normal attention span and concentration.  Behavior appropriate. No anxious or depressed appearing.     Assessment     Assessment DM, Started metformin 04-2015 HTN Hyperlipidemia Hypogonadism Dx few years ago by endocrinology, secondary hypogonadism. Was rx testosterone, did not notice any subjective difference while on meds. Currently on no HRT. DEXA 2014 and 2023: Normal  Erectile dysfunction Sleep apnea, mild, saw pulm 2014, CPAP was not mandatory, mostly to increase his QOL (see pulm note) DJD Gout Elevated LFTs x years, previous w/u (-) per pt ; hep B and C (-) 10-2014 Sees dermatology prn EKG 09/2017: RBBB, echo [facility].  PLAN: Here for CPX DM: No ambulatory CBGs, doing well with diet and exercise, on metformin, check labs. HTN: On amlodipine, HCTZ, Diovan.  Ambulatory BPs  when checked are within normal.  Checking labs. Gout: Having episodes very infrequently, well-managed with colchicine. RTC 6 months

## 2022-03-04 ENCOUNTER — Encounter: Payer: Self-pay | Admitting: Internal Medicine

## 2022-03-04 NOTE — Assessment & Plan Note (Signed)
Here for CPX DM: No ambulatory CBGs, doing well with diet and exercise, on metformin, check labs. HTN: On amlodipine, HCTZ, Diovan.  Ambulatory BPs when checked are within normal.  Checking labs. Gout: Having episodes very infrequently, well-managed with colchicine. RTC 6 months

## 2022-03-04 NOTE — Assessment & Plan Note (Signed)
-  Td 2020 - s/p zostavax and shingrex - RSV rec - PNM 23: 04/21/13; prevnar 9/11/ 2015; PNM 20: 2023 - covid vax: booster rec  - had a  flu shot   -CCS: Cscope at age 72, normal, Dr Cher Nakai. Cscope 2016 normal, Dr Hilarie Fredrickson, 10 years -prostate ca screening:  no further screening. -Labs: CMP FLP CBC A1c -Lifestyle: Remains active, eating healthy, + weight loss.. -Healthcare POA: See AVS

## 2022-06-20 ENCOUNTER — Other Ambulatory Visit: Payer: Self-pay | Admitting: Internal Medicine

## 2022-06-21 ENCOUNTER — Inpatient Hospital Stay (HOSPITAL_COMMUNITY): Payer: Medicare HMO

## 2022-06-21 ENCOUNTER — Inpatient Hospital Stay (HOSPITAL_COMMUNITY)
Admission: EM | Admit: 2022-06-21 | Discharge: 2022-06-24 | DRG: 062 | Disposition: A | Discharge status: Rehabilitation Facility | Payer: Medicare HMO | Attending: Neurology | Admitting: Neurology

## 2022-06-21 ENCOUNTER — Emergency Department (HOSPITAL_COMMUNITY): Payer: Medicare HMO

## 2022-06-21 ENCOUNTER — Encounter (HOSPITAL_COMMUNITY): Payer: Self-pay | Admitting: Neurology

## 2022-06-21 DIAGNOSIS — E785 Hyperlipidemia, unspecified: Secondary | ICD-10-CM | POA: Diagnosis present

## 2022-06-21 DIAGNOSIS — Z79899 Other long term (current) drug therapy: Secondary | ICD-10-CM

## 2022-06-21 DIAGNOSIS — G8111 Spastic hemiplegia affecting right dominant side: Secondary | ICD-10-CM | POA: Diagnosis not present

## 2022-06-21 DIAGNOSIS — I10 Essential (primary) hypertension: Secondary | ICD-10-CM | POA: Diagnosis present

## 2022-06-21 DIAGNOSIS — I63312 Cerebral infarction due to thrombosis of left middle cerebral artery: Secondary | ICD-10-CM | POA: Diagnosis not present

## 2022-06-21 DIAGNOSIS — I6300 Cerebral infarction due to thrombosis of unspecified precerebral artery: Secondary | ICD-10-CM | POA: Diagnosis not present

## 2022-06-21 DIAGNOSIS — R29810 Facial weakness: Secondary | ICD-10-CM | POA: Diagnosis not present

## 2022-06-21 DIAGNOSIS — K5900 Constipation, unspecified: Secondary | ICD-10-CM | POA: Diagnosis not present

## 2022-06-21 DIAGNOSIS — Z820 Family history of epilepsy and other diseases of the nervous system: Secondary | ICD-10-CM

## 2022-06-21 DIAGNOSIS — J3489 Other specified disorders of nose and nasal sinuses: Secondary | ICD-10-CM | POA: Diagnosis not present

## 2022-06-21 DIAGNOSIS — Z7984 Long term (current) use of oral hypoglycemic drugs: Secondary | ICD-10-CM | POA: Diagnosis not present

## 2022-06-21 DIAGNOSIS — E669 Obesity, unspecified: Secondary | ICD-10-CM | POA: Diagnosis not present

## 2022-06-21 DIAGNOSIS — Z833 Family history of diabetes mellitus: Secondary | ICD-10-CM

## 2022-06-21 DIAGNOSIS — G4733 Obstructive sleep apnea (adult) (pediatric): Secondary | ICD-10-CM | POA: Diagnosis not present

## 2022-06-21 DIAGNOSIS — R29713 NIHSS score 13: Secondary | ICD-10-CM | POA: Diagnosis present

## 2022-06-21 DIAGNOSIS — Z96651 Presence of right artificial knee joint: Secondary | ICD-10-CM | POA: Diagnosis present

## 2022-06-21 DIAGNOSIS — I700 Atherosclerosis of aorta: Secondary | ICD-10-CM | POA: Diagnosis present

## 2022-06-21 DIAGNOSIS — M1990 Unspecified osteoarthritis, unspecified site: Secondary | ICD-10-CM | POA: Diagnosis present

## 2022-06-21 DIAGNOSIS — M5432 Sciatica, left side: Secondary | ICD-10-CM | POA: Diagnosis not present

## 2022-06-21 DIAGNOSIS — K5901 Slow transit constipation: Secondary | ICD-10-CM | POA: Diagnosis not present

## 2022-06-21 DIAGNOSIS — G459 Transient cerebral ischemic attack, unspecified: Secondary | ICD-10-CM | POA: Diagnosis not present

## 2022-06-21 DIAGNOSIS — F17200 Nicotine dependence, unspecified, uncomplicated: Secondary | ICD-10-CM | POA: Diagnosis not present

## 2022-06-21 DIAGNOSIS — M109 Gout, unspecified: Secondary | ICD-10-CM | POA: Diagnosis present

## 2022-06-21 DIAGNOSIS — I63032 Cerebral infarction due to thrombosis of left carotid artery: Secondary | ICD-10-CM | POA: Diagnosis not present

## 2022-06-21 DIAGNOSIS — Z8249 Family history of ischemic heart disease and other diseases of the circulatory system: Secondary | ICD-10-CM

## 2022-06-21 DIAGNOSIS — R471 Dysarthria and anarthria: Secondary | ICD-10-CM | POA: Diagnosis not present

## 2022-06-21 DIAGNOSIS — H9122 Sudden idiopathic hearing loss, left ear: Secondary | ICD-10-CM | POA: Diagnosis not present

## 2022-06-21 DIAGNOSIS — F17290 Nicotine dependence, other tobacco product, uncomplicated: Secondary | ICD-10-CM | POA: Diagnosis not present

## 2022-06-21 DIAGNOSIS — N529 Male erectile dysfunction, unspecified: Secondary | ICD-10-CM | POA: Diagnosis present

## 2022-06-21 DIAGNOSIS — R233 Spontaneous ecchymoses: Secondary | ICD-10-CM | POA: Diagnosis present

## 2022-06-21 DIAGNOSIS — Z743 Need for continuous supervision: Secondary | ICD-10-CM | POA: Diagnosis not present

## 2022-06-21 DIAGNOSIS — I6523 Occlusion and stenosis of bilateral carotid arteries: Secondary | ICD-10-CM | POA: Diagnosis not present

## 2022-06-21 DIAGNOSIS — E1169 Type 2 diabetes mellitus with other specified complication: Secondary | ICD-10-CM | POA: Diagnosis not present

## 2022-06-21 DIAGNOSIS — Z6832 Body mass index (BMI) 32.0-32.9, adult: Secondary | ICD-10-CM

## 2022-06-21 DIAGNOSIS — R531 Weakness: Secondary | ICD-10-CM | POA: Diagnosis not present

## 2022-06-21 DIAGNOSIS — I69398 Other sequelae of cerebral infarction: Secondary | ICD-10-CM | POA: Diagnosis not present

## 2022-06-21 DIAGNOSIS — Z794 Long term (current) use of insulin: Secondary | ICD-10-CM | POA: Diagnosis not present

## 2022-06-21 DIAGNOSIS — I639 Cerebral infarction, unspecified: Secondary | ICD-10-CM

## 2022-06-21 DIAGNOSIS — E1165 Type 2 diabetes mellitus with hyperglycemia: Secondary | ICD-10-CM | POA: Diagnosis not present

## 2022-06-21 DIAGNOSIS — M25531 Pain in right wrist: Secondary | ICD-10-CM | POA: Diagnosis not present

## 2022-06-21 DIAGNOSIS — E119 Type 2 diabetes mellitus without complications: Secondary | ICD-10-CM | POA: Diagnosis not present

## 2022-06-21 DIAGNOSIS — R1310 Dysphagia, unspecified: Secondary | ICD-10-CM | POA: Diagnosis present

## 2022-06-21 DIAGNOSIS — E871 Hypo-osmolality and hyponatremia: Secondary | ICD-10-CM | POA: Diagnosis present

## 2022-06-21 DIAGNOSIS — G8191 Hemiplegia, unspecified affecting right dominant side: Secondary | ICD-10-CM | POA: Diagnosis present

## 2022-06-21 DIAGNOSIS — I69351 Hemiplegia and hemiparesis following cerebral infarction affecting right dominant side: Secondary | ICD-10-CM | POA: Diagnosis not present

## 2022-06-21 DIAGNOSIS — R4701 Aphasia: Secondary | ICD-10-CM | POA: Diagnosis not present

## 2022-06-21 DIAGNOSIS — I63512 Cerebral infarction due to unspecified occlusion or stenosis of left middle cerebral artery: Principal | ICD-10-CM | POA: Diagnosis present

## 2022-06-21 DIAGNOSIS — I69391 Dysphagia following cerebral infarction: Secondary | ICD-10-CM | POA: Diagnosis not present

## 2022-06-21 DIAGNOSIS — R4781 Slurred speech: Secondary | ICD-10-CM | POA: Diagnosis not present

## 2022-06-21 DIAGNOSIS — I6381 Other cerebral infarction due to occlusion or stenosis of small artery: Secondary | ICD-10-CM | POA: Diagnosis not present

## 2022-06-21 HISTORY — DX: Type 2 diabetes mellitus without complications: E11.9

## 2022-06-21 LAB — GLUCOSE, CAPILLARY
Glucose-Capillary: 201 mg/dL — ABNORMAL HIGH (ref 70–99)
Glucose-Capillary: 195 mg/dL — ABNORMAL HIGH (ref 70–99)
Glucose-Capillary: 130 mg/dL — ABNORMAL HIGH (ref 70–99)
Glucose-Capillary: 141 mg/dL — ABNORMAL HIGH (ref 70–99)

## 2022-06-21 LAB — APTT: aPTT: 30 seconds (ref 24–36)

## 2022-06-21 LAB — DIFFERENTIAL
Abs Immature Granulocytes: 0.05 10*3/uL (ref 0.00–0.07)
Basophils Absolute: 0 10*3/uL (ref 0.0–0.1)
Basophils Relative: 0 %
Eosinophils Absolute: 0.4 10*3/uL (ref 0.0–0.5)
Eosinophils Relative: 6 %
Immature Granulocytes: 1 %
Lymphocytes Relative: 28 %
Lymphs Abs: 2.1 10*3/uL (ref 0.7–4.0)
Monocytes Absolute: 0.7 10*3/uL (ref 0.1–1.0)
Monocytes Relative: 9 %
Neutro Abs: 4.1 10*3/uL (ref 1.7–7.7)
Neutrophils Relative %: 56 %

## 2022-06-21 LAB — CBC
HCT: 38.8 % — ABNORMAL LOW (ref 39.0–52.0)
Hemoglobin: 14.1 g/dL (ref 13.0–17.0)
MCH: 32.4 pg (ref 26.0–34.0)
MCHC: 36.3 g/dL — ABNORMAL HIGH (ref 30.0–36.0)
MCV: 89.2 fL (ref 80.0–100.0)
Platelets: 196 10*3/uL (ref 150–400)
RBC: 4.35 MIL/uL (ref 4.22–5.81)
RDW: 12.2 % (ref 11.5–15.5)
WBC: 7.4 10*3/uL (ref 4.0–10.5)
nRBC: 0 % (ref 0.0–0.2)

## 2022-06-21 LAB — I-STAT CHEM 8, ED
BUN: 20 mg/dL (ref 8–23)
Calcium, Ion: 1.09 mmol/L — ABNORMAL LOW (ref 1.15–1.40)
Chloride: 96 mmol/L — ABNORMAL LOW (ref 98–111)
Creatinine, Ser: 1 mg/dL (ref 0.61–1.24)
Glucose, Bld: 224 mg/dL — ABNORMAL HIGH (ref 70–99)
HCT: 46 % (ref 39.0–52.0)
Hemoglobin: 15.6 g/dL (ref 13.0–17.0)
Potassium: 3.7 mmol/L (ref 3.5–5.1)
Sodium: 132 mmol/L — ABNORMAL LOW (ref 135–145)
TCO2: 25 mmol/L (ref 22–32)

## 2022-06-21 LAB — PROTIME-INR
INR: 1.1 (ref 0.8–1.2)
Prothrombin Time: 14.5 seconds (ref 11.4–15.2)

## 2022-06-21 LAB — COMPREHENSIVE METABOLIC PANEL
ALT: 34 U/L (ref 0–44)
AST: 24 U/L (ref 15–41)
Albumin: 3.8 g/dL (ref 3.5–5.0)
Alkaline Phosphatase: 66 U/L (ref 38–126)
Anion gap: 14 (ref 5–15)
BUN: 20 mg/dL (ref 8–23)
CO2: 20 mmol/L — ABNORMAL LOW (ref 22–32)
Calcium: 9 mg/dL (ref 8.9–10.3)
Chloride: 96 mmol/L — ABNORMAL LOW (ref 98–111)
Creatinine, Ser: 1.07 mg/dL (ref 0.61–1.24)
GFR, Estimated: >60 mL/min (ref >60)
Glucose, Bld: 223 mg/dL — ABNORMAL HIGH (ref 70–99)
Potassium: 3.8 mmol/L (ref 3.5–5.1)
Sodium: 130 mmol/L — ABNORMAL LOW (ref 135–145)
Total Bilirubin: 0.7 mg/dL (ref 0.3–1.2)
Total Protein: 6.6 g/dL (ref 6.5–8.1)

## 2022-06-21 LAB — ETHANOL: Alcohol, Ethyl (B): <10 mg/dL (ref <10)

## 2022-06-21 MED ORDER — ACETAMINOPHEN 325 MG PO TABS: 650.0000 mg | ORAL_TABLET | ORAL | Status: DC | PRN

## 2022-06-21 MED ORDER — SODIUM CHLORIDE 0.9% FLUSH: 3.0000 mL | Freq: Once | INTRAVENOUS | Status: DC

## 2022-06-21 MED ORDER — CLEVIDIPINE BUTYRATE 0.5 MG/ML IV EMUL: 0.0000 mg/h | INTRAVENOUS | Status: DC

## 2022-06-21 MED ORDER — TENECTEPLASE FOR STROKE
25.0000 mg | PACK | Freq: Once | INTRAVENOUS | Status: AC
  Administered 2022-06-21: 25 mg via INTRAVENOUS
  Filled 2022-06-21: qty 10

## 2022-06-21 MED ORDER — INSULIN ASPART 100 UNIT/ML IJ SOLN
0.0000 [IU] | Freq: Three times a day (TID) | INTRAMUSCULAR | Status: DC
  Administered 2022-06-21: 2 [IU] via SUBCUTANEOUS
  Administered 2022-06-21: 3 [IU] via SUBCUTANEOUS
  Administered 2022-06-21: 5 [IU] via SUBCUTANEOUS
  Administered 2022-06-22: 3 [IU] via SUBCUTANEOUS
  Administered 2022-06-22: 5 [IU] via SUBCUTANEOUS
  Administered 2022-06-22 – 2022-06-23 (×3): 3 [IU] via SUBCUTANEOUS
  Administered 2022-06-23: 5 [IU] via SUBCUTANEOUS
  Administered 2022-06-24 (×2): 3 [IU] via SUBCUTANEOUS

## 2022-06-21 MED ORDER — COLCHICINE 0.6 MG PO TABS: 0.6000 mg | ORAL_TABLET | Freq: Two times a day (BID) | ORAL | Status: DC | PRN

## 2022-06-21 MED ORDER — PANTOPRAZOLE SODIUM 40 MG IV SOLR
40.0000 mg | Freq: Every day | INTRAVENOUS | Status: DC
  Administered 2022-06-21: 40 mg via INTRAVENOUS
  Filled 2022-06-21: qty 10

## 2022-06-21 MED ORDER — ACETAMINOPHEN 650 MG RE SUPP: 650.0000 mg | RECTAL | Status: DC | PRN

## 2022-06-21 MED ORDER — ACETAMINOPHEN 160 MG/5ML PO SOLN: 650.0000 mg | ORAL | Status: DC | PRN

## 2022-06-21 MED ORDER — IRBESARTAN 75 MG PO TABS
37.5000 mg | ORAL_TABLET | Freq: Every day | ORAL | Status: DC
  Administered 2022-06-21 – 2022-06-24 (×4): 37.5 mg via ORAL
  Filled 2022-06-21 (×5): qty 0.5

## 2022-06-21 MED ORDER — AMLODIPINE BESYLATE 10 MG PO TABS
10.0000 mg | ORAL_TABLET | Freq: Every day | ORAL | Status: DC
  Administered 2022-06-21 – 2022-06-24 (×4): 10 mg via ORAL
  Filled 2022-06-21 (×4): qty 1

## 2022-06-21 MED ORDER — SODIUM CHLORIDE 0.9 % IV SOLN: INTRAVENOUS | Status: DC

## 2022-06-21 MED ORDER — IOHEXOL 350 MG/ML SOLN
75.0000 mL | Freq: Once | INTRAVENOUS | Status: AC | PRN
  Administered 2022-06-21: 75 mL via INTRAVENOUS

## 2022-06-21 MED ORDER — SIMVASTATIN 20 MG PO TABS
20.0000 mg | ORAL_TABLET | Freq: Every day | ORAL | Status: DC
  Administered 2022-06-21 – 2022-06-23 (×3): 20 mg via ORAL
  Filled 2022-06-21 (×3): qty 1

## 2022-06-21 MED ORDER — SENNOSIDES-DOCUSATE SODIUM 8.6-50 MG PO TABS: 1.0000 | ORAL_TABLET | Freq: Every evening | ORAL | Status: DC | PRN

## 2022-06-21 MED ORDER — CHLORHEXIDINE GLUCONATE CLOTH 2 % EX PADS
6.0000 | MEDICATED_PAD | Freq: Every day | CUTANEOUS | Status: DC
  Administered 2022-06-21 – 2022-06-22 (×2): 6 via TOPICAL

## 2022-06-21 MED ORDER — STROKE: EARLY STAGES OF RECOVERY BOOK
Freq: Once | Status: AC
  Filled 2022-06-21: qty 1

## 2022-06-21 NOTE — ED Notes (Signed)
Istat chem 8 results given to EDP 

## 2022-06-21 NOTE — ED Provider Notes (Signed)
[place] EMERGENCY DEPARTMENT AT [facility] Provider Note   CSN: [id number] Arrival date & time: 06/21/22  0540     History  Chief complaint-weakness  Level 5 caveat due to acuity of condition   Jimmy Valentine is a 72 y.o. male.  The history is provided by the patient and the EMS personnel.  Patient with history of hypertension, presents as a code stroke.  It is reported the patient woke up in the middle the night and noted right-sided weakness.  EMS was called, but in route he started to improve.  Just prior to arrival he started having right arm, right leg weakness as well as difficulty speaking.  Last known well now around 5:30 AM.  Patient was urgently taken to the CT scanner   Patient denies any headache, no chest pain or abdominal pain.  He denies any recent trauma    Home Medications Prior to Admission medications   Medication Sig Start Date End Date Taking? Authorizing Provider  amLODipine (NORVASC) 10 MG tablet TAKE 1 TABLET BY MOUTH EVERY DAY 01/15/22   Wanda Plump, MD  bisacodyl (DULCOLAX) 5 MG EC tablet Take 5 mg by mouth once. For colon 5-20 Patient not taking: Reported on 08/19/2021    [provider]  colchicine 0.6 MG tablet Take 1 tablet (0.6 mg total) by mouth 2 (two) times daily as needed. 02/22/19   Wanda Plump, MD  hydrochlorothiazide (HYDRODIURIL) 25 MG tablet Take 1 tablet (25 mg total) by mouth daily. 12/24/21   Wanda Plump, MD  metFORMIN (GLUCOPHAGE) 1000 MG tablet Take 1 tablet (1,000 mg total) by mouth 2 (two) times daily with a meal. 12/31/21   Wanda Plump, MD  sildenafil (REVATIO) 20 MG tablet TAKE 3 TO 4 TABLETS BY MOUTH EVERY DAY AS NEEDED 02/03/22   Wanda Plump, MD  simvastatin (ZOCOR) 40 MG tablet TAKE 0.5 TABLETS (20 MG TOTAL) BY MOUTH AT BEDTIME. 02/24/22   Wanda Plump, MD  valsartan (DIOVAN) 320 MG tablet Take 1 tablet (320 mg total) by mouth daily. 01/19/22   Wanda Plump, MD      Allergies    Patient has no known  allergies.    Review of Systems   Review of Systems  Unable to perform ROS: Acuity of condition    Physical Exam Updated Vital Signs BP (!) (P) 150/90   Pulse (P) 100   Resp (P) 18   Wt 105.2 kg   BMI 34.25 kg/m  Physical Exam CONSTITUTIONAL: Well developed/well nourished HEAD: Normocephalic/atraumatic EYES: EOMI/PERRL ENMT: Mucous membranes moist CV: S1/S2 noted, no murmurs/rubs/gallops noted LUNGS: Lungs are clear to auscultation bilaterally, no apparent distress ABDOMEN: soft, nontender NEURO: Pt is awake/alert Right facial droop noted, right leg drift noted See neurology notes for further details neurology exam EXTREMITIES: pulses normal/equal, no deformities SKIN: warm, color normal PSYCH: Unable to assess  ED Results / Procedures / Treatments   Labs (all labs ordered are listed, but only abnormal results are displayed) Labs Reviewed  CBC - Abnormal; Notable for the following components:      Result Value   HCT 38.8 (*)    MCHC 36.3 (*)    All other components within normal limits  COMPREHENSIVE METABOLIC PANEL - Abnormal; Notable for the following components:   Sodium 130 (*)    Chloride 96 (*)    CO2 20 (*)    Glucose, Bld 223 (*)    All other components within normal  limits  I-STAT CHEM 8, ED - Abnormal; Notable for the following components:   Sodium 132 (*)    Chloride 96 (*)    Glucose, Bld 224 (*)    Calcium, Ion 1.09 (*)    All other components within normal limits  PROTIME-INR  APTT  DIFFERENTIAL  ETHANOL  CBG MONITORING, ED    EKG EKG Interpretation  Date/Time:  Sunday Jun 21 2022 06:16:45 EDT Ventricular Rate:  93 PR Interval:  173 QRS Duration: 145 QT Interval:  390 QTC Calculation: 486 R Axis:   110 Text Interpretation: Sinus rhythm Probable left atrial enlargement RBBB and LPFB No previous ECGs available Confirmed by Zadie Rhine ([id number]) on 06/21/2022 6:26:01 AM  Radiology CT ANGIO HEAD NECK W WO CM  Result Date:  06/21/2022 CLINICAL DATA:  72 year old male code stroke. Questionable left MCA hyperdensity and severe right side weakness. EXAM: CT ANGIOGRAPHY HEAD AND NECK WITH AND WITHOUT CONTRAST TECHNIQUE: Multidetector CT imaging of the head and neck was performed using the standard protocol during bolus administration of intravenous contrast. Multiplanar CT image reconstructions and MIPs were obtained to evaluate the vascular anatomy. Carotid stenosis measurements (when applicable) are obtained utilizing NASCET criteria, using the distal internal carotid diameter as the denominator. RADIATION DOSE REDUCTION: This exam was performed according to the departmental dose-optimization program which includes automated exposure control, adjustment of the mA and/or kV according to patient size and/or use of iterative reconstruction technique. CONTRAST:  75mL OMNIPAQUE IOHEXOL 350 MG/ML SOLN COMPARISON:  Plain head CT reported separately. FINDINGS: CTA NECK Skeleton: No acute or suspicious osseous lesion. Upper chest: Negative. Other neck: Negative. Aortic arch: Calcified aortic atherosclerosis.  3 vessel arch. Right carotid system: Mild brachiocephalic artery and right CCA plaque. But bulky calcified plaque at the right ICA origin and bulb with radiographic string sign on series 5, image 107, 106. The vessel remains patent and is mildly tortuous to the skull base. Left carotid system: No left CCA origin plaque. Mild soft plaque in the medial vessel at the level of the larynx on series 5, image 126 but no stenosis there. Complex combined soft and calcified plaque at the left ICA origin and bulb combining for high-grade stenosis numerically estimated at 65-70 % with respect to the distal vessel. See series 5 images 105 and 106. Additional calcified plaque downstream of that level. The vessel remains patent. Mild calcified plaque also just below the skull base. Vertebral arteries: Proximal right subclavian artery calcified  atherosclerosis which does also involve the right vertebral artery origin which is only mildly stenotic on series 8, image 105. However, the vertebral artery shows decreased enhancement compared to the left side throughout the neck. It does remain patent to the skull base, but see intracranial findings below. Proximal left subclavian artery plaque without hemodynamically significant stenosis. Left vertebral artery origin remains normal. Left V1 segment calcified plaque with mild to moderate stenosis on series 7, image 284. Left vertebral V2 segment is patent without stenosis. Mild V3 calcified plaque, only mild stenosis there. CTA HEAD Posterior circulation: Distal right vertebral artery is occluded in the V4 segment with upstream complex combined soft and calcified plaque and/or thrombus within the vessel. See series 7 images 151 through 137. Contralateral left V4 segment is heavily calcified especially at the left PICA origin with string sign stenosis there. But the left vertebral remains patent and supplies the basilar. The basilar artery is patent with a fairly normal caliber. Only mild basilar artery irregularity. Patent SCA and PCA origins.  Posterior communicating arteries are diminutive or absent. Bilateral PCA branches are within normal limits. Anterior circulation: Right ICA siphon is patent with moderate calcified atherosclerosis but only mild right siphon stenosis. Right ICA terminus is patent, mildly bulbous and also partially calcified. Contralateral left ICA siphon is patent with moderate pre cavernous and severe supraclinoid calcified atherosclerosis. Moderate to severe left siphon supraclinoid stenosis series 7, image 98. However, the left carotid terminus remains patent. MCA and ACA origins are patent. Right A1 appears mildly dominant. Anterior communicating artery and bilateral ACA branches are within normal limits. Right MCA M1 segment is patent to the bifurcation. No bifurcation stenosis. Right  MCA branches are within normal limits. Left MCA M1 remains patent but is highly irregular and there is a 4 cm segment of thrombus in the vessel seen on series 11, image 21, series 5, image 47 and also convincingly on series 9 images 134 and 136. There is associated high-grade stenosis. But the left MCA bi/trifurcation remains patent. No discrete left MCA branch occlusion. Venous sinuses: Early contrast timing, not well evaluated. Anatomic variants: None significant. Review of the MIP images confirms the above findings IMPRESSION: 1. 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 The left MCA bi/trifurcation and left M2 branches remain patent. 2. Superimposed Severe intracranial and cervical ICA Atherosclerosis resulting in: - distal Right Vertebral Artery (V4) OCCLUSION. - distal Left Vertebral Artery Severe (V4) stenosis. - Right ICA origin RADIOGRAPHIC STRING SIGN stenosis due to calcified plaque. - Left ICA origin stenosis due to complex plaque estimated 65-70%. - Severe Left ICA supraclinoid stenosis due to calcified plaque. 3. The above was preliminarily discussed by telephone with Dr. Caryl Pina on 06/21/2022 at 0558 hours. 4.  Aortic Atherosclerosis (ICD10-I70.0). Electronically Signed   By: Odessa Fleming M.D.   On: 06/21/2022 06:19   CT HEAD CODE STROKE WO CONTRAST  Result Date: 06/21/2022 CLINICAL DATA:  Code stroke. 72 year old male with severe right side weakness. EXAM: CT HEAD WITHOUT CONTRAST TECHNIQUE: Contiguous axial images were obtained from the base of the skull through the vertex without intravenous contrast. RADIATION DOSE REDUCTION: This exam was performed according to the departmental dose-optimization program which includes automated exposure control, adjustment of the mA and/or kV according to patient size and/or use of iterative reconstruction technique. COMPARISON:  None  Available. FINDINGS: Brain: No acute intracranial hemorrhage identified. No midline shift, mass effect, or evidence of intracranial mass lesion. No ventriculomegaly. Chronic appearing, highly circumscribed lacunar infarct of the left corona radiata/basal ganglia series 2, image 21. No cytotoxic edema, acute cortically based infarct identified. No chronic encephalomalacia *CRASH * that no chronic cortical encephalomalacia identified. Vascular: Severe Calcified atherosclerosis at the skull base. Questionable hyperdensity left MCA M1. Skull: No acute osseous abnormality identified. Sinuses/Orbits: Bilateral paranasal sinus mucosal thickening and opacification, moderate. No sinus fluid levels. Tympanic cavities and mastoids are well aerated. Other: No gaze deviation.  No acute scalp soft tissue finding. ASPECTS [facility] Stroke Program Early CT Score) Total score (0-10 with 10 being normal): 10 IMPRESSION: 1. Questionable hyperdense left MCA M1, but severe underlying intracranial atherosclerosis so might be artifact. 2. ASPECTS 10. 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 3.  No acute intracranial hemorrhage identified. 4. Study discussed by telephone with Dr. Otelia Limes on 06/21/2022 at 0551 hours. Electronically Signed   By: Odessa Fleming M.D.   On: 06/21/2022 06:06    Procedures .Critical  Care  Performed by: Zadie Rhine, MD Authorized by: Zadie Rhine, MD   Critical care provider statement:    Critical care time (minutes):  40   Critical care start time:  06/21/2022 5:50 AM   Critical care end time:  06/21/2022 6:30 AM   Critical care time was exclusive of:  Separately billable procedures and treating other patients   Critical care was necessary to treat or prevent imminent or life-threatening deterioration of the following conditions:  CNS failure or compromise   Critical care was time spent personally by me on the following activities:  Obtaining history from patient or surrogate,  examination of patient, pulse oximetry, ordering and review of laboratory studies, review of old charts, ordering and review of radiographic studies, ordering and performing treatments and interventions, evaluation of patient's response to treatment, development of treatment plan with patient or surrogate and discussions with consultants   I assumed direction of critical care for this patient from another provider in my specialty: no     Care discussed with: admitting provider       Medications Ordered in ED Medications  sodium chloride flush (NS) 0.9 % injection 3 mL (has no administration in time range)   stroke: early stages of recovery book (has no administration in time range)  0.9 %  sodium chloride infusion (has no administration in time range)  acetaminophen (TYLENOL) tablet 650 mg (has no administration in time range)    Or  acetaminophen (TYLENOL) 160 MG/5ML solution 650 mg (has no administration in time range)    Or  acetaminophen (TYLENOL) suppository 650 mg (has no administration in time range)  senna-docusate (Senokot-S) tablet 1 tablet (has no administration in time range)  pantoprazole (PROTONIX) injection 40 mg (has no administration in time range)  clevidipine (CLEVIPREX) infusion 0.5 mg/mL (has no administration in time range)  amLODipine (NORVASC) tablet 10 mg (has no administration in time range)  colchicine tablet 0.6 mg (has no administration in time range)  simvastatin (ZOCOR) tablet 20 mg (has no administration in time range)  irbesartan (AVAPRO) tablet 37.5 mg (has no administration in time range)  insulin aspart (novoLOG) injection 0-15 Units (has no administration in time range)  tenecteplase (TNKASE) injection for Stroke 25 mg (25 mg Intravenous Given 06/21/22 0551)  iohexol (OMNIPAQUE) 350 MG/ML injection 75 mL (75 mLs Intravenous Contrast Given 06/21/22 0557)    ED Course/ Medical Decision Making/ A&P Clinical Course as of 06/21/22 0626  Sun Jun 21, 2022   0604 Glucose(!): 224 Hyperglycemia [DW]  0605 Patient seen on arrival as a code stroke.  Patient had obvious right-sided flaccidity with dysarthria.  CT head was reviewed and no acute hemorrhage.  Patient was given TNKase in the radiology suite for acute CVA.  Will be admitted to the neurology service.  Discussed the case with Dr. Otelia Limes [DW]  [phone] Patient stabilized in the emergency department.  No large vessel occlusion on CT angio.  Discussed with Dr. Otelia Limes with neurology, he will be admitted [DW]    Clinical Course User Index [DW] Zadie Rhine, MD                             Medical Decision Making Amount and/or Complexity of Data Reviewed Labs: ordered. Decision-making details documented in ED Course. Radiology: ordered.  Risk Decision regarding hospitalization.   This patient presents to the ED for concern of weakness, this involves an extensive number of treatment options, and is  a complaint that carries with it a high risk of complications and morbidity.  The differential diagnosis includes but is not limited to CVA, intracranial hemorrhage, acute coronary syndrome, renal failure, urinary tract infection, electrolyte disturbance, pneumonia   Comorbidities that complicate the patient evaluation: Patient's presentation is complicated by their history of hypertension  Social Determinants of Health: Patient's  lives alone   increases the complexity of managing their presentation  Additional history obtained: Additional history obtained from EMS  Records reviewed Primary Care Documents  Lab Tests: I Ordered, and personally interpreted labs.  The pertinent results include: hyperGlycemia  Imaging Studies ordered: I ordered imaging studies including CT scan head   I independently visualized and interpreted imaging which showed no acute intracranial hemorrhage I agree with the radiologist interpretation  Cardiac Monitoring: The patient was maintained on a cardiac  monitor.  I personally viewed and interpreted the cardiac monitor which showed an underlying rhythm of:  sinus rhythm  Medicines ordered and prescription drug management: I ordered medication including TNK for acute CVA   Critical Interventions:   thrombolytics for CVA  Consultations Obtained: I requested consultation with the admitting physician neuro Dr. Otelia Limes , and discussed  findings as well as pertinent plan - they recommend: We will admit  Reevaluation: After the interventions noted above, I reevaluated the patient and found that they have :stayed the same  Complexity of problems addressed: Patient's presentation is most consistent with  acute presentation with potential threat to life or bodily function  Disposition: After consideration of the diagnostic results and the patient's response to treatment,  I feel that the patent would benefit from admission   .           Final Clinical Impression(s) / ED Diagnoses Final diagnoses:  Cerebrovascular accident (CVA), unspecified mechanism [facility])    Rx / DC Orders ED Discharge Orders     None         Zadie Rhine, MD 06/21/22 [phone]

## 2022-06-21 NOTE — ED Notes (Signed)
Pt arrived at CT 

## 2022-06-21 NOTE — ED Notes (Signed)
Pt brought to room 24. Placed on monitor.

## 2022-06-21 NOTE — Evaluation (Signed)
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 male with PMH: arthritis, ED, gout, HLD, HTN, sleep apnea, DM, idiopathic sensorineural hearing loss (left). He presented to the ED on 06/21/22 via EMS from home for code stroke after acute onset of right sided weakness. En route to ED, patient spontaneously recovered completely with all symptoms but while still en route, his symptoms recurred. CT head was negative for acute intracranial hemorrhage. He received TNK. MRI  brain ordered but not completed at time of SLP evaluation. Neurology reporting that patient's overall presentation is consistent with aute ischemic stroke within the left MCA territory.   Assessment / Plan / Recommendation Clinical Impression  Patient is presenting with a mild-moderate flaccid dysarthria, a mild expressive aphasia and mild cognitive impairment. Dysarthria significantly impacts his speech intelligiblity but improves if he is speaking at a slower rate and opening mouth wider when speaking. He was oriented to time, place and situation and did know that he had a stroke, but did not demonstrate awareness to impact of CVA on current function. Confrontational naming was [facility], divergent naming was impaired (named only 4 items for both categories (animals, fruit). He was able to describe object function and name object when function described without errors. He initially struggled with categorical naming but this improved after first trial. Eye contact with SLP and even with family members in room was not consistent and affect flat overall. SLP recommending continued intervention and will likely benefit from skilled SLP intervention at next venue of care as well.    SLP Assessment  SLP Recommendation/Assessment: Patient needs continued Speech Lanaguage Pathology Services SLP Visit Diagnosis: Aphasia (R47.01);Dysarthria and anarthria (R47.1);Cognitive communication deficit (R41.841)    Recommendations for follow up therapy are one component of a multi-disciplinary discharge planning process, led by the attending physician.  Recommendations may be updated based on patient status, additional functional criteria and insurance authorization.    Follow Up Recommendations  Acute inpatient rehab (3hours/day)  Assistance Recommended at Discharge  Frequent or constant Supervision/Assistance  Functional Status Assessment Patient has had a recent decline in their functional status and demonstrates the  ability to make significant improvements in function in a reasonable and predictable amount of time.  Frequency and Duration min 2x/week  2 weeks      SLP Evaluation Cognition  Overall Cognitive Status: Impaired/Different from baseline Arousal/Alertness: Awake/alert Orientation Level: Oriented to place;Oriented to time;Oriented to situation;Oriented to person Year: 2024 Month: May Day of Week: Correct Attention: Sustained Sustained Attention: Impaired Sustained Attention Impairment: Verbal complex Awareness: Impaired Awareness Impairment: Emergent impairment Safety/Judgment: Impaired       Comprehension  Auditory Comprehension Overall Auditory Comprehension: Appears within functional limits for tasks assessed    Expression Expression Primary Mode of Expression: Verbal Verbal Expression Overall Verbal Expression: Impaired Initiation: No impairment Level of Generative/Spontaneous Verbalization: Phrase;Word Repetition: No impairment Naming: Impairment Confrontation: Within functional limits Convergent: 50-74% accurate Divergent: 25-49% accurate Pragmatics: Impairment Impairments: Abnormal affect Interfering Components: Attention;Speech intelligibility Effective Techniques: Open ended questions Non-Verbal Means of Communication: Not applicable Written Expression Dominant Hand: Right Written Expression: Not tested   Oral / Motor  Oral Motor/Sensory Function Overall Oral Motor/Sensory Function: Moderate impairment Facial ROM: Reduced right Facial Symmetry: Abnormal symmetry right Facial Strength: Reduced right Facial Sensation: Reduced right Lingual ROM: Within Functional Limits Lingual Symmetry: Within Functional Limits Lingual Strength: Reduced Velum: Within Functional Limits Mandible: Within Functional Limits Motor Speech Overall Motor Speech: Impaired Respiration: Within functional limits Phonation: Normal Resonance: Within functional limits Articulation:  Impaired Level of Impairment: Phrase Intelligibility: Intelligibility reduced Word: 75-100% accurate Phrase: 25-49% accurate Sentence: 25-49% accurate Conversation: Not tested Motor Planning: Witnin functional limits Motor Speech Errors: Not applicable Effective Techniques: Slow rate;Increased vocal intensity;Over-articulate         Angela Nevin, MA, CCC-SLP Speech Therapy

## 2022-06-21 NOTE — Progress Notes (Signed)
PHARMACIST CODE STROKE RESPONSE  Notified to mix TNK at 0548 by Dr. Otelia Limes TNK preparation completed at 0550  TNK dose = 25 mg IV over 5 seconds   Christoper Fabian, PharmD, BCPS Please see amion for complete clinical pharmacist phone list 06/21/22 5:55 AM

## 2022-06-21 NOTE — Progress Notes (Signed)
VASCULAR LAB    Carotid duplex has been performed.  See CV proc for preliminary results.   [place] Churchill, RVT 06/21/2022, 10:02 AM

## 2022-06-21 NOTE — ED Notes (Signed)
Report given to [facility] RN on 4N.

## 2022-06-21 NOTE — Code Documentation (Signed)
Stroke Response Nurse Documentation Code Documentation  Jimmy Valentine is a 72 y.o. male arriving to [facility]  via [facility] on 5/19 with past medical hx of HTN and DM2. On No antithrombotic. Code stroke was activated by EMS.   Patient from home where he was LKW at 0530 and now complaining of right sided weakness and slurred speech.  Stroke team at the bedside on patient arrival. Labs drawn and patient cleared for CT by Dr. Bebe Shaggy. Patient to CT with team. NIHSS 13, see documentation for details and code stroke times. Patient with right facial droop, right arm weakness, right leg weakness, right decreased sensation, and dysarthria  on exam. The following imaging was completed:  CT Head and CTA. Patient is a candidate for IV Thrombolytic due to fixed neurological deficit. Patient is not a candidate for IR due to No LVO.   Care Plan: TNKase.   Bedside handoff with ED RN Blossom Hoops.    Rose Fillers  Rapid Response RN

## 2022-06-21 NOTE — ED Notes (Signed)
ED TO INPATIENT HANDOFF REPORT  ED Nurse Name and Phone #: Ned Clines Name/Age/Gender Jimmy Valentine 72 y.o. male Room/Bed: 024C/024C  Code Status   Code Status: Full Code  Home/SNF/Other Home Patient oriented to: self, place, and time Is this baseline? 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]  Triage Note No notes on file   Allergies No Known Allergies  Level of Care/Admitting Diagnosis ED Disposition     ED Disposition  Admit   Condition  --   Comment  Hospital Area: [facility] [[id number]]  Level of Care: ICU [6]  May admit patient to [facility] or [facility] if equivalent level of care is available:: No  Covid Evaluation: Asymptomatic - no recent exposure (last 10 days) testing not required  Diagnosis: Stroke (cerebrum) [facility]) [[id number]]  Admitting Physician: Otelia Limes ERIC Valen.Docker  Attending Physician: Otelia Limes, ERIC Valen.Docker  Certification:: I certify this patient will need inpatient services for at least 2 midnights  Estimated Length of Stay: 7          B Medical/Surgery History 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,had a MRI, was prescribed testosterone by endocrinology   Sleep apnea    does't use cpap   Sudden hearing loss, left 2017   idiopathic sensorineural hearing loss   Past Surgical History:  Procedure Laterality Date   TOTAL KNEE ARTHROPLASTY Right 2005   TOTAL KNEE ARTHROPLASTY  12/04/2011   LEFT-- TOTAL KNEE ARTHROPLASTY;  Surgeon: Eugenia Mcalpine, MD;  Location: [facility];  Service: Orthopedics;  Laterality: Left;     A IV Location/Drains/Wounds Patient Lines/Drains/Airways Status     Active Line/Drains/Airways     Name Placement date Placement time  Site Days   Airway 12/04/11  1717  -- 3852   Incision 12/04/11 Leg Left 12/04/11  1808  -- 3852            Intake/Output Last 24 hours No intake or output data in the 24 hours ending 06/21/22 0454  Labs/Imaging Results for orders placed or performed during the hospital encounter of 06/21/22 (from the past 48 hour(s))  Protime-INR     Status: None   Collection Time: 06/21/22  5:43 AM  Result Value Ref Range   Prothrombin Time 14.5 11.4 - 15.2 seconds   INR 1.1 0.8 - 1.2    Comment: (NOTE) INR goal varies based on device and disease states. Performed at [facility], [address], [place], [place] [address]   APTT     Status: None   Collection Time: 06/21/22  5:43 AM  Result Value Ref Range   aPTT 30 24 - 36 seconds    Comment: Performed at [facility], [address], [place], [place] [address]  CBC     Status: Abnormal   Collection Time: 06/21/22  5:43 AM  Result Value Ref Range   WBC 7.4 4.0 - 10.5 K/uL   RBC 4.35 4.22 - 5.81 MIL/uL   Hemoglobin 14.1 13.0 - 17.0 g/dL   HCT 29.5 (L) 62.1 - 30.8 %   MCV 89.2 80.0 - 100.0 fL   MCH 32.4 26.0 - 34.0 pg  MCHC 36.3 (H) 30.0 - 36.0 g/dL   RDW 09.6 04.5 - 40.9 %   Platelets 196 150 - 400 K/uL   nRBC 0.0 0.0 - 0.2 %    Comment: Performed at [facility], [address] [place], [place] [address]  Differential     Status: None   Collection Time: 06/21/22  5:43 AM  Result Value Ref Range   Neutrophils Relative % 56 %   Neutro Abs 4.1 1.7 - 7.7 K/uL   Lymphocytes Relative 28 %   Lymphs Abs 2.1 0.7 - 4.0 K/uL   Monocytes Relative 9 %   Monocytes Absolute 0.7 0.1 - 1.0 K/uL   Eosinophils Relative 6 %   Eosinophils Absolute 0.4 0.0 - 0.5 K/uL   Basophils Relative 0 %   Basophils Absolute 0.0 0.0 - 0.1 K/uL   Immature Granulocytes 1 %   Abs Immature Granulocytes 0.05 0.00 - 0.07 K/uL    Comment: Performed at [facility], [address] [place], [place] [address]  Comprehensive metabolic panel      Status: Abnormal   Collection Time: 06/21/22  5:43 AM  Result Value Ref Range   Sodium 130 (L) 135 - 145 mmol/L   Potassium 3.8 3.5 - 5.1 mmol/L   Chloride 96 (L) 98 - 111 mmol/L   CO2 20 (L) 22 - 32 mmol/L   Glucose, Bld 223 (H) 70 - 99 mg/dL    Comment: Glucose reference range applies only to samples taken after fasting for at least 8 hours.   BUN 20 8 - 23 mg/dL   Creatinine, Ser 5.62 0.61 - 1.24 mg/dL   Calcium 9.0 8.9 - 13.0 mg/dL   Total Protein 6.6 6.5 - 8.1 g/dL   Albumin 3.8 3.5 - 5.0 g/dL   AST 24 15 - 41 U/L   ALT 34 0 - 44 U/L   Alkaline Phosphatase 66 38 - 126 U/L   Total Bilirubin 0.7 0.3 - 1.2 mg/dL   GFR, Estimated >86 >57 mL/min    Comment: (NOTE) Calculated using the CKD-EPI Creatinine Equation (2021)    Anion gap 14 5 - 15    Comment: Performed at [facility], [address] [place], [place] [address]  Ethanol     Status: None   Collection Time: 06/21/22  5:43 AM  Result Value Ref Range   Alcohol, Ethyl (B) <10 <10 mg/dL    Comment: (NOTE) Lowest detectable limit for serum alcohol is 10 mg/dL.  For medical purposes only. Performed at [facility], [address] [place], [place] [address]   I-stat chem 8, ED     Status: Abnormal   Collection Time: 06/21/22  5:45 AM  Result Value Ref Range   Sodium 132 (L) 135 - 145 mmol/L   Potassium 3.7 3.5 - 5.1 mmol/L   Chloride 96 (L) 98 - 111 mmol/L   BUN 20 8 - 23 mg/dL   Creatinine, Ser 4.13 0.61 - 1.24 mg/dL   Glucose, Bld 244 (H) 70 - 99 mg/dL    Comment: Glucose reference range applies only to samples taken after fasting for at least 8 hours.   Calcium, Ion 1.09 (L) 1.15 - 1.40 mmol/L   TCO2 25 22 - 32 mmol/L   Hemoglobin 15.6 13.0 - 17.0 g/dL   HCT 01.0 27.2 - 53.6 %   CT ANGIO HEAD NECK W WO CM  Result Date: 06/21/2022 CLINICAL DATA:  72 year old male code stroke. Questionable  left MCA hyperdensity and severe right side weakness. EXAM: CT ANGIOGRAPHY HEAD AND NECK WITH AND WITHOUT  CONTRAST TECHNIQUE: Multidetector CT imaging of the head and neck was performed using the standard protocol during bolus administration of intravenous contrast. Multiplanar CT image reconstructions and MIPs were obtained to evaluate the vascular anatomy. Carotid stenosis measurements (when applicable) are obtained utilizing NASCET criteria, using the distal internal carotid diameter as the denominator. RADIATION DOSE REDUCTION: This exam was performed according to the departmental dose-optimization program which includes automated exposure control, adjustment of the mA and/or kV according to patient size and/or use of iterative reconstruction technique. CONTRAST:  75mL OMNIPAQUE IOHEXOL 350 MG/ML SOLN COMPARISON:  Plain head CT reported separately. FINDINGS: CTA NECK Skeleton: No acute or suspicious osseous lesion. Upper chest: Negative. Other neck: Negative. Aortic arch: Calcified aortic atherosclerosis.  3 vessel arch. Right carotid system: Mild brachiocephalic artery and right CCA plaque. But bulky calcified plaque at the right ICA origin and bulb with radiographic string sign on series 5, image 107, 106. The vessel remains patent and is mildly tortuous to the skull base. Left carotid system: No left CCA origin plaque. Mild soft plaque in the medial vessel at the level of the larynx on series 5, image 126 but no stenosis there. Complex combined soft and calcified plaque at the left ICA origin and bulb combining for high-grade stenosis numerically estimated at 65-70 % with respect to the distal vessel. See series 5 images 105 and 106. Additional calcified plaque downstream of that level. The vessel remains patent. Mild calcified plaque also just below the skull base. Vertebral arteries: Proximal right subclavian artery calcified atherosclerosis which does also involve the right vertebral artery origin which is only mildly stenotic on series 8, image 105. However, the vertebral artery shows decreased enhancement  compared to the left side throughout the neck. It does remain patent to the skull base, but see intracranial findings below. Proximal left subclavian artery plaque without hemodynamically significant stenosis. Left vertebral artery origin remains normal. Left V1 segment calcified plaque with mild to moderate stenosis on series 7, image 284. Left vertebral V2 segment is patent without stenosis. Mild V3 calcified plaque, only mild stenosis there. CTA HEAD Posterior circulation: Distal right vertebral artery is occluded in the V4 segment with upstream complex combined soft and calcified plaque and/or thrombus within the vessel. See series 7 images 151 through 137. Contralateral left V4 segment is heavily calcified especially at the left PICA origin with string sign stenosis there. But the left vertebral remains patent and supplies the basilar. The basilar artery is patent with a fairly normal caliber. Only mild basilar artery irregularity. Patent SCA and PCA origins. Posterior communicating arteries are diminutive or absent. Bilateral PCA branches are within normal limits. Anterior circulation: Right ICA siphon is patent with moderate calcified atherosclerosis but only mild right siphon stenosis. Right ICA terminus is patent, mildly bulbous and also partially calcified. Contralateral left ICA siphon is patent with moderate pre cavernous and severe supraclinoid calcified atherosclerosis. Moderate to severe left siphon supraclinoid stenosis series 7, image 98. However, the left carotid terminus remains patent. MCA and ACA origins are patent. Right A1 appears mildly dominant. Anterior communicating artery and bilateral ACA branches are within normal limits. Right MCA M1 segment is patent to the bifurcation. No bifurcation stenosis. Right MCA branches are within normal limits. Left MCA M1 remains patent but is highly irregular and there is a 4 cm segment of thrombus in the vessel seen on series 11, image  21, series 5,  image 47 and also convincingly on series 9 images 134 and 136. There is associated high-grade stenosis. But the left MCA bi/trifurcation remains patent. No discrete left MCA branch occlusion. Venous sinuses: Early contrast timing, not well evaluated. Anatomic variants: None significant. Review of the MIP images confirms the above findings IMPRESSION: 1. 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 The left MCA bi/trifurcation and left M2 branches remain patent. 2. Superimposed Severe intracranial and cervical ICA Atherosclerosis resulting in: - distal Right Vertebral Artery (V4) OCCLUSION. - distal Left Vertebral Artery Severe (V4) stenosis. - Right ICA origin RADIOGRAPHIC STRING SIGN stenosis due to calcified plaque. - Left ICA origin stenosis due to complex plaque estimated 65-70%. - Severe Left ICA supraclinoid stenosis due to calcified plaque. 3. The above was preliminarily discussed by telephone with Dr. Caryl Pina on 06/21/2022 at 0558 hours. 4.  Aortic Atherosclerosis (ICD10-I70.0). Electronically Signed   By: Odessa Fleming M.D.   On: 06/21/2022 06:19   CT HEAD CODE STROKE WO CONTRAST  Result Date: 06/21/2022 CLINICAL DATA:  Code stroke. 72 year old male with severe right side weakness. EXAM: CT HEAD WITHOUT CONTRAST TECHNIQUE: Contiguous axial images were obtained from the base of the skull through the vertex without intravenous contrast. RADIATION DOSE REDUCTION: This exam was performed according to the departmental dose-optimization program which includes automated exposure control, adjustment of the mA and/or kV according to patient size and/or use of iterative reconstruction technique. COMPARISON:  None Available. FINDINGS: Brain: No acute intracranial hemorrhage identified. No midline shift, mass effect, or evidence of intracranial mass lesion. No ventriculomegaly. Chronic appearing,  highly circumscribed lacunar infarct of the left corona radiata/basal ganglia series 2, image 21. No cytotoxic edema, acute cortically based infarct identified. No chronic encephalomalacia *CRASH * that no chronic cortical encephalomalacia identified. Vascular: Severe Calcified atherosclerosis at the skull base. Questionable hyperdensity left MCA M1. Skull: No acute osseous abnormality identified. Sinuses/Orbits: Bilateral paranasal sinus mucosal thickening and opacification, moderate. No sinus fluid levels. Tympanic cavities and mastoids are well aerated. Other: No gaze deviation.  No acute scalp soft tissue finding. ASPECTS [facility] Stroke Program Early CT Score) Total score (0-10 with 10 being normal): 10 IMPRESSION: 1. Questionable hyperdense left MCA M1, but severe underlying intracranial atherosclerosis so might be artifact. 2. ASPECTS 10. 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 3.  No acute intracranial hemorrhage identified. 4. Study discussed by telephone with Dr. Otelia Limes on 06/21/2022 at 0551 hours. Electronically Signed   By: Odessa Fleming M.D.   On: 06/21/2022 06:06    Pending Labs Unresulted Labs (From admission, onward)     Start     Ordered   06/22/22 0500  Lipid panel  (Labs)  Tomorrow morning,   R       Comments: Fasting    06/21/22 0623            Vitals/Pain Today's Vitals   06/21/22 0636 06/21/22 0645 06/21/22 0700 06/21/22 0716  BP: (!) 169/103 137/89 (!) 154/90 (!) 149/95  Pulse: 94 87 90 91  Resp: 19 17 15 19   Temp:      TempSrc:      SpO2: 97% 95% 96% 97%  Weight:      Height:        Isolation Precautions No active isolations  Medications Medications  sodium chloride flush (NS) 0.9 % injection 3 mL (has no administration  in time range)   stroke: early stages of recovery book (has no administration in time range)  0.9 %  sodium chloride infusion (has no administration in time range)  acetaminophen (TYLENOL) tablet 650 mg (has no  administration in time range)    Or  acetaminophen (TYLENOL) 160 MG/5ML solution 650 mg (has no administration in time range)    Or  acetaminophen (TYLENOL) suppository 650 mg (has no administration in time range)  senna-docusate (Senokot-S) tablet 1 tablet (has no administration in time range)  pantoprazole (PROTONIX) injection 40 mg (has no administration in time range)  clevidipine (CLEVIPREX) infusion 0.5 mg/mL (has no administration in time range)  amLODipine (NORVASC) tablet 10 mg (has no administration in time range)  colchicine tablet 0.6 mg (has no administration in time range)  simvastatin (ZOCOR) tablet 20 mg (has no administration in time range)  irbesartan (AVAPRO) tablet 37.5 mg (has no administration in time range)  insulin aspart (novoLOG) injection 0-15 Units (has no administration in time range)  tenecteplase (TNKASE) injection for Stroke 25 mg (25 mg Intravenous Given 06/21/22 0551)  iohexol (OMNIPAQUE) 350 MG/ML injection 75 mL (75 mLs Intravenous Contrast Given 06/21/22 0557)    Mobility walks         R Recommendations: See Admitting Provider Note  Report given to:   Additional Notes:

## 2022-06-21 NOTE — Progress Notes (Addendum)
STROKE TEAM PROGRESS NOTE   INTERVAL HISTORY His family is at the bedside on assessment this morning.  Patient recently arrived to [facility] ICU this morning with some improvement of his right-sided weakness since TNK administration.  Shortly after arrival, patient's bedside RN noted worsening right-sided weakness from her initial assessment (initially slight pronator drift, now minimal antigravity strength noted) and neurology was notified while on the unit at 08:30 AM.  Patient assessed at bedside with attending neurologist.  Patient's right-sided weakness noted to be fluctuating but patient was still weaker on the right than bedside RN's initial assessment.  Stat CT head order placed.   STAT CT head with stable appearance of the brain without acute ICH or evolving infarct identified.  Residual intravascular contrast was noted.  Vitals:   06/21/22 0716 06/21/22 0723 06/21/22 0744 06/21/22 0747  BP: (!) 149/95 (!) 149/95 (!) 145/88 (!) 149/76  Pulse: 91 94 93 90  Resp: 19 19 (!) 22 15  Temp:    97.6 F (36.4 C)  TempSrc:    Oral  SpO2: 97% 96% 97% 95%  Weight:      Height:       CBC:  Recent Labs  Lab 06/21/22 0543 06/21/22 0545  WBC 7.4  --   NEUTROABS 4.1  --   HGB 14.1 15.6  HCT 38.8* 46.0  MCV 89.2  --   PLT 196  --    Basic Metabolic Panel:  Recent Labs  Lab 06/21/22 0543 06/21/22 0545  NA 130* 132*  K 3.8 3.7  CL 96* 96*  CO2 20*  --   GLUCOSE 223* 224*  BUN 20 20  CREATININE 1.07 1.00  CALCIUM 9.0  --    Lipid Panel: No results for input(s): "CHOL", "TRIG", "HDL", "CHOLHDL", "VLDL", "LDLCALC" in the last 168 hours. HgbA1c: No results for input(s): "HGBA1C" in the last 168 hours. Urine Drug Screen: No results for input(s): "LABOPIA", "COCAINSCRNUR", "LABBENZ", "AMPHETMU", "THCU", "LABBARB" in the last 168 hours.  Alcohol Level  Recent Labs  Lab 06/21/22 0543  ETH <10   IMAGING past 24 hours CT ANGIO HEAD NECK W WO CM  Result Date: 06/21/2022 CLINICAL DATA:   72 year old male code stroke. Questionable left MCA hyperdensity and severe right side weakness. EXAM: CT ANGIOGRAPHY HEAD AND NECK WITH AND WITHOUT CONTRAST TECHNIQUE: Multidetector CT imaging of the head and neck was performed using the standard protocol during bolus administration of intravenous contrast. Multiplanar CT image reconstructions and MIPs were obtained to evaluate the vascular anatomy. Carotid stenosis measurements (when applicable) are obtained utilizing NASCET criteria, using the distal internal carotid diameter as the denominator. RADIATION DOSE REDUCTION: This exam was performed according to the departmental dose-optimization program which includes automated exposure control, adjustment of the mA and/or kV according to patient size and/or use of iterative reconstruction technique. CONTRAST:  75mL OMNIPAQUE IOHEXOL 350 MG/ML SOLN COMPARISON:  Plain head CT reported separately. FINDINGS: CTA NECK Skeleton: No acute or suspicious osseous lesion. Upper chest: Negative. Other neck: Negative. Aortic arch: Calcified aortic atherosclerosis.  3 vessel arch. Right carotid system: Mild brachiocephalic artery and right CCA plaque. But bulky calcified plaque at the right ICA origin and bulb with radiographic string sign on series 5, image 107, 106. The vessel remains patent and is mildly tortuous to the skull base. Left carotid system: No left CCA origin plaque. Mild soft plaque in the medial vessel at the level of the larynx on series 5, image 126 but no stenosis there. Complex combined  soft and calcified plaque at the left ICA origin and bulb combining for high-grade stenosis numerically estimated at 65-70 % with respect to the distal vessel. See series 5 images 105 and 106. Additional calcified plaque downstream of that level. The vessel remains patent. Mild calcified plaque also just below the skull base. Vertebral arteries: Proximal right subclavian artery calcified atherosclerosis which does also involve  the right vertebral artery origin which is only mildly stenotic on series 8, image 105. However, the vertebral artery shows decreased enhancement compared to the left side throughout the neck. It does remain patent to the skull base, but see intracranial findings below. Proximal left subclavian artery plaque without hemodynamically significant stenosis. Left vertebral artery origin remains normal. Left V1 segment calcified plaque with mild to moderate stenosis on series 7, image 284. Left vertebral V2 segment is patent without stenosis. Mild V3 calcified plaque, only mild stenosis there. CTA HEAD Posterior circulation: Distal right vertebral artery is occluded in the V4 segment with upstream complex combined soft and calcified plaque and/or thrombus within the vessel. See series 7 images 151 through 137. Contralateral left V4 segment is heavily calcified especially at the left PICA origin with string sign stenosis there. But the left vertebral remains patent and supplies the basilar. The basilar artery is patent with a fairly normal caliber. Only mild basilar artery irregularity. Patent SCA and PCA origins. Posterior communicating arteries are diminutive or absent. Bilateral PCA branches are within normal limits. Anterior circulation: Right ICA siphon is patent with moderate calcified atherosclerosis but only mild right siphon stenosis. Right ICA terminus is patent, mildly bulbous and also partially calcified. Contralateral left ICA siphon is patent with moderate pre cavernous and severe supraclinoid calcified atherosclerosis. Moderate to severe left siphon supraclinoid stenosis series 7, image 98. However, the left carotid terminus remains patent. MCA and ACA origins are patent. Right A1 appears mildly dominant. Anterior communicating artery and bilateral ACA branches are within normal limits. Right MCA M1 segment is patent to the bifurcation. No bifurcation stenosis. Right MCA branches are within normal limits.  Left MCA M1 remains patent but is highly irregular and there is a 4 cm segment of thrombus in the vessel seen on series 11, image 21, series 5, image 47 and also convincingly on series 9 images 134 and 136. There is associated high-grade stenosis. But the left MCA bi/trifurcation remains patent. No discrete left MCA branch occlusion. Venous sinuses: Early contrast timing, not well evaluated. Anatomic variants: None significant. Review of the MIP images confirms the above findings IMPRESSION: 1. 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 The left MCA bi/trifurcation and left M2 branches remain patent. 2. Superimposed Severe intracranial and cervical ICA Atherosclerosis resulting in: - distal Right Vertebral Artery (V4) OCCLUSION. - distal Left Vertebral Artery Severe (V4) stenosis. - Right ICA origin RADIOGRAPHIC STRING SIGN stenosis due to calcified plaque. - Left ICA origin stenosis due to complex plaque estimated 65-70%. - Severe Left ICA supraclinoid stenosis due to calcified plaque. 3. The above was preliminarily discussed by telephone with Jimmy Valentine on 06/21/2022 at 0558 hours. 4.  Aortic Atherosclerosis (ICD10-I70.0). Electronically Signed   By: Odessa Fleming M.D.   On: 06/21/2022 06:19   CT HEAD CODE STROKE WO CONTRAST  Result Date: 06/21/2022 CLINICAL DATA:  Code stroke. 72 year old male with severe right side weakness. EXAM: CT HEAD WITHOUT CONTRAST TECHNIQUE: Contiguous axial images were obtained from  the base of the skull through the vertex without intravenous contrast. RADIATION DOSE REDUCTION: This exam was performed according to the departmental dose-optimization program which includes automated exposure control, adjustment of the mA and/or kV according to patient size and/or use of iterative reconstruction technique. COMPARISON:  None Available. FINDINGS: Brain: No acute  intracranial hemorrhage identified. No midline shift, mass effect, or evidence of intracranial mass lesion. No ventriculomegaly. Chronic appearing, highly circumscribed lacunar infarct of the left corona radiata/basal ganglia series 2, image 21. No cytotoxic edema, acute cortically based infarct identified. No chronic encephalomalacia *CRASH * that no chronic cortical encephalomalacia identified. Vascular: Severe Calcified atherosclerosis at the skull base. Questionable hyperdensity left MCA M1. Skull: No acute osseous abnormality identified. Sinuses/Orbits: Bilateral paranasal sinus mucosal thickening and opacification, moderate. No sinus fluid levels. Tympanic cavities and mastoids are well aerated. Other: No gaze deviation.  No acute scalp soft tissue finding. ASPECTS [facility] Stroke Program Early CT Score) Total score (0-10 with 10 being normal): 10 IMPRESSION: 1. Questionable hyperdense left MCA M1, but severe underlying intracranial atherosclerosis so might be artifact. 2. ASPECTS 10. 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 3.  No acute intracranial hemorrhage identified. 4. Study discussed by telephone with Dr. Otelia Limes on 06/21/2022 at 0551 hours. Electronically Signed   By: Odessa Fleming M.D.   On: 06/21/2022 06:06    PHYSICAL EXAM Constitutional: Appears well-developed and well-nourished.  Psych: Affect appropriate to situation, calm and cooperative with exam Eyes: No scleral injection HENT: No OP obstrucion MSK: no joint deformities or swelling Cardiovascular: Normal rate and regular rhythm.  Respiratory: Effort normal, non-labored breathing GI: Soft.  No distension. There is no tenderness.  Skin: WDI  Neuro: Mental Status: Patient is awake, alert, oriented to person, place, month, year, and situation. Patient is able to give a clear and coherent history. No signs of aphasia or neglect. There is mild dysarthria noted.  Cranial Nerves: II: Visual Fields are full. Pupils  are equal, round, and reactive to light.   III,IV, VI: EOMI without gaze preference V: Facial sensation is intact and symmetric to light touch VII: Right facial droop present VIII: Hearing is intact to voice X: Palate elevates symmetrically XI: Shoulder shrug is symmetric. XII: Tongue protrudes midline without atrophy or fasciculations.  Motor: Tone is normal. Bulk is normal.  Fluctuating strength noted on the right upper extremity.  Initially, patient was unable to elevate right upper extremity antigravity.  During assessment, patient did have some fluctuation with intermittent antigravity elevation.  At best patient was able to elevate antigravity with vertical drift to bed.  Right lower extremity had some antigravity movement with 3/5 strength noted. Sensory: Patient reports equal sensation to light touch in bilateral upper and lower extremities. Gait: Unable to assess for patient safety s/p TNK administration   ASSESSMENT/PLAN Jimmy Valentine is a 72 y.o. male with history of arthritis, ED, gout, HLD, HTN, OSA, DM2, and idiopathic sensorineural hearing loss (left) presenting with acute onset right-sided weakness, right-sided facial droop, and dysarthria.  While en route to the ED, patient's symptoms resolved before they again recurred.  Presentation is concerning for left MCA territory ischemic stroke s/p TNK administration.  Stroke:  likely left MCA territory infarct due to adherent thrombus in the left MCA M1 segment resulting in high-grade stenosis. 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  Code Stroke CT head questionable hyperdense left MCA M1, but severe underlying intracranial atherosclerosis so may be artifact.  ASPECTS  10.   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  No acute intracranial hemorrhage identified. CTA head & neck negative for any complete anterior circulation LVO but positive for adherent thrombus  in the left MCA M1 segment resulting in high-grade stenosis.  The left MCA bi/trifurcation and left M2 branches remain patent.  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  Aortic atherosclerosis. MRI pending 2D Echo pending Carotid duplex right and left ICA velocities are consistent with a 1-39% stenosis LDL 91 HgbA1c 6.7 Appreciate vascular surgery recommendations regarding patient's various stenoses identified on CTA imaging.  VTE prophylaxis -SCDs s/p TNK administration    Diet   Diet NPO time specified   No antithrombotic prior to admission, now on No antithrombotic s/p TNK administration 06/21/2022 Therapy recommendations: Pending Disposition: Pending  Hypertension Home meds: Norvasc, HCTZ, valsartan Unstable BP <180/105 for 24 hours post-TNK Long-term BP goal normotensive  Hyperlipidemia Home meds: Simvastatin 40 mg LDL 91, goal < 70 Change home statin to atorvastatin 80 mg PO daily for high intensity statin dosing and LDL elevation above goal Continue statin at discharge  Diabetes type II Controlled Home meds: Metformin HgbA1c 6.7, goal < 7.0 CBGs Recent Labs    06/21/22 0829  GLUCAP 201*    SSI as needed  Other Stroke Risk Factors Advanced Age >/= 69  Former cigarette smoker, quit smoking about 26 years ago Obesity, Body mass index is 32.49 kg/m., BMI >/= 30 associated with increased stroke risk, recommend weight loss, diet and exercise as appropriate  Hx stroke/TIA Coronary artery disease Obstructive sleep apnea, noton CPAP at home  Other Active Problems Gout on home colchicine  Hospital day # 0  Jimmy Valentine, AGACNP-BC [facility] Pager: [phone]  ATTENDING ATTESTATION:  Same day note. No charge. Discussed with Vascular Dr. Karin Lieu. U/S shows 1-40%. CTA likely an error. No need for intervention.  Dr. Viviann Valentine evaluated pt independently, reviewed imaging, chart, labs. Discussed and formulated plan with the Resident/APP. Changes were made to the note where appropriate. Please see APP/resident note above for details.    Jimmy Saltsman,MD   To contact Stroke Continuity provider, please refer to [email or web address]. After hours, contact General Neurology

## 2022-06-21 NOTE — Evaluation (Signed)
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 male with PMH: arthritis, ED, gout, HLD, HTN, sleep apnea, DM, idiopathic sensorineural hearing loss (left). He presented to the ED on 06/21/22 via EMS from home for code stroke after acute onset of right sided weakness. En route to ED, patient spontaneously recovered completely with all symptoms but while still en route, his symptoms recurred. CT head was negative for acute intracranial hemorrhage. He received TNK. MRI brain ordered but not completed at time of SLP evaluation. Neurology reporting that patient's overall presentation is consistent with aute ischemic stroke within the left MCA territory.    Assessment / Plan / Recommendation  Clinical Impression  Patient presents with clinical s/s of dysphagia as per this bedside swallow evaluation. He passed Yale swallow with RN, however when SLP was assessing his speech, language and cognition, he was observed to cough when drinking  water and so swallow evaluation requested from MD. Patient exhibited suspected delayed swallow initiation with sips of liquids (thin, nectar) and both immediate and mildly delayed coughing with thin liquids. With puree and mechanical soft solids, patient with right sided anterior spillage/leakage of PO's and right sided pocketing in oral cavity with patient not fully aware. He was able to clear residuals with cues to perform lingual sweep and/or using finger. No coughing observed with nectar thick liquids. SLP recommending PO diet of Dys 3 (mechanical soft) solids and nectar thick liquids and will follow for diet toleration and ability to advance. Recomending objective swallow study next 1-2 dates. (MBS) SLP Visit Diagnosis: Dysphagia, unspecified (R13.10)    Aspiration Risk  Mild aspiration risk    Diet Recommendation Dysphagia 3 (Mech soft);Nectar-thick liquid   Liquid Administration via: Cup;Straw Medication Administration: Whole meds with liquid Supervision: Patient able to self feed;Full supervision/cueing for compensatory strategies Compensations: Slow rate;Small sips/bites;Minimize environmental distractions;Lingual sweep for clearance of pocketing Postural Changes: Seated upright at 90 degrees    Other  Recommendations Oral Care Recommendations: Oral care BID    Recommendations for follow up therapy are one component of a multi-disciplinary discharge planning process, led by the attending physician.  Recommendations may be updated based on patient status, additional functional criteria and insurance authorization.  Follow up Recommendations Acute inpatient rehab (3hours/day)      Assistance Recommended at Discharge Frequent or constant Supervision/Assistance  Functional Status Assessment Patient has had a recent decline in their functional status and demonstrates the ability to make significant improvements  in function in a reasonable and predictable amount of time.  Frequency and  Duration min 2x/week  2 weeks       Prognosis Prognosis for improved oropharyngeal function: Good      Swallow Study   General Date of Onset: 06/21/22 HPI: Patient is a 72 y.o. male with PMH: arthritis, ED, gout, HLD, HTN, sleep apnea, DM, idiopathic sensorineural hearing loss (left). He presented to the ED on 06/21/22 via EMS from home for code stroke after acute onset of right sided weakness. En route to ED, patient spontaneously recovered completely with all symptoms but while still en route, his symptoms recurred. CT head was negative for acute intracranial hemorrhage. He received TNK. MRI brain ordered but not completed at time of SLP evaluation. Neurology reporting that patient's overall presentation is consistent with aute ischemic stroke within the left MCA territory. Type of Study: Bedside Swallow Evaluation Previous Swallow Assessment: none found Diet Prior to this Study: Regular;Thin liquids (Level 0) Temperature Spikes Noted: No Respiratory Status: Room air History of Recent Intubation: No Behavior/Cognition: Alert;Cooperative Oral Cavity Assessment: Within Functional Limits Oral Care Completed by SLP: Yes Oral Cavity - Dentition: Adequate natural dentition Vision: Functional for self-feeding Self-Feeding Abilities: Needs assist;Needs set up Patient Positioning: Upright in bed Baseline Vocal Quality: Normal Volitional Cough: Strong Volitional Swallow: Able to elicit    Oral/Motor/Sensory Function Overall Oral Motor/Sensory Function: Moderate impairment Facial ROM: Reduced right Facial Symmetry: Abnormal symmetry right Facial Strength: Reduced right Facial Sensation: Reduced right Lingual ROM: Within Functional Limits Lingual Symmetry: Within Functional Limits Lingual Strength: Reduced Velum: Within Functional Limits Mandible: Within Functional Limits   Ice Chips Ice chips: Not tested   Thin Liquid Thin Liquid: Impaired Presentation: Straw Oral Phase Functional  Implications: Right anterior spillage Pharyngeal  Phase Impairments: Suspected delayed Swallow;Cough - Immediate;Cough - Delayed    Nectar Thick Nectar Thick Liquid: Impaired Presentation: Straw Oral phase functional implications: Right anterior spillage Pharyngeal Phase Impairments: Suspected delayed Swallow   Honey Thick Honey Thick Liquid: Not tested   Puree Puree: Impaired Oral Phase Functional Implications: Right anterior spillage   Solid     Solid: Impaired Oral Phase Functional Implications: Oral residue;Right anterior spillage     Angela Nevin, MA, CCC-SLP Speech Therapy

## 2022-06-21 NOTE — H&P (Signed)
Admission H&P    Chief Complaint: Acute onset of right sided weakness  HPI: Jimmy Valentine is an 72 y.o. male with a PMHx of arthritis, ED, gout, HLD, HTN, sleep apnea, DM and idiopathic sensorineural hearing loss (left) who presents to the ED from home via EMS as a Code Stroke for acute onset of right sided weakness. He went to sleep at 10:00 PM and woke up in the middle of the night at 3 AM still feeling normal. At 4:00 AM he noted that he was having trouble turning over in his bed due to weakness. He called his neighbor at about 4:30 AM and the neighbor called EMS. EMS arrived at about 4:50 AM and noted the patient to be hemiplegic on the right with right facial droop and severe dysarthria, but able to answer questions and follow commands. Vitals per EMS: BP 150/90, normal O2 sats, CBG 227. En route to the ED the patient spontaneously recovered completely with normal speech and full strength x 4. Later en route, at 0530, his symptoms recurred. He is not on a blood thinner.   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,had a MRI, was prescribed testosterone by endocrinology   Sleep apnea    does't use cpap   Sudden hearing loss, left 2017   idiopathic sensorineural hearing loss    Past Surgical History:  Procedure Laterality Date   TOTAL KNEE ARTHROPLASTY Right 2005   TOTAL KNEE ARTHROPLASTY  12/04/2011   LEFT-- TOTAL KNEE ARTHROPLASTY;  Surgeon: Eugenia Mcalpine, MD;  Location: [facility];  Service: Orthopedics;  Laterality: Left;    Family History  Problem Relation Age of Onset   Hypertension Mother    Coronary artery disease Mother        M CABG at age 27s   Alzheimer's disease Mother    Diabetes Sister    Colon cancer Neg Hx    Prostate cancer Neg Hx    Stomach cancer Neg Hx    Rectal cancer Neg Hx     Social History:  reports that he quit smoking about 26 years ago. His smoking use included cigarettes. He has a 15.00 pack-year smoking history. He has never used smokeless tobacco. He reports current alcohol use of about 12.0 standard drinks of alcohol per week. He reports that he does not use drugs.  Allergies: No Known Allergies  (Not in a hospital admission)  No current facility-administered medications on file prior to encounter.   Current Outpatient Medications on File Prior to Encounter  Medication Sig Dispense Refill   amLODipine (NORVASC) 10 MG tablet TAKE 1 TABLET BY MOUTH EVERY DAY 90 tablet 1   bisacodyl (DULCOLAX) 5 MG EC tablet Take 5 mg by mouth once. For colon 5-20 (Patient not taking: Reported on 08/19/2021)     colchicine 0.6 MG tablet Take 1 tablet (0.6 mg total) by mouth 2 (two) times daily as needed. 60 tablet 6   hydrochlorothiazide (HYDRODIURIL) 25 MG tablet Take 1 tablet (25 mg total) by mouth daily. 90 tablet 1   metFORMIN (GLUCOPHAGE) 1000 MG tablet Take 1 tablet (1,000 mg total) by mouth 2 (two) times daily with a meal. 180 tablet 1   sildenafil (REVATIO) 20 MG tablet TAKE 3 TO 4 TABLETS BY MOUTH EVERY  DAY AS NEEDED 270 tablet 0   simvastatin (ZOCOR) 40 MG tablet TAKE 0.5 TABLETS (20 MG TOTAL) BY MOUTH AT BEDTIME. 45 tablet 1   valsartan (DIOVAN) 320 MG tablet Take 1 tablet (320 mg total) by mouth daily. 90 tablet 1     ROS: Complains of a mild headache to the front and top of his head. No CP or SOB. Denies any other symptoms.   Physical Examination:  BP (!) (P) 150/90   Pulse (P) 100   Resp (P) 18   Wt 105.2 kg   BMI 34.25 kg/m   Weight 105.2 kg.  Physical Exam  HEENT-  Mark/AT    Lungs- Respirations unlabored Extremities- No edema  Neurological Examination Mental Status: Awake with decreased level of alertness. Speech is severely dysarthric but with intact naming, repetition and comprehension. Oriented x 5. Able to follow all left sided motor  commands without difficulty. Cranial Nerves: II: Visual fields intact bilaterally. PERRL  III,IV, VI: No ptosis. EOM are full but with left gaze preference.   V: Decreased sensation on the right  VII: Prominent right facial droop VIII: Hearing intact to voice IX,X: Severely hypophonic speech XI: Head preferentially rotated to the left XII: Lingual dysarthria.  Motor: RUE with flaccid tone, 0/5 RLE 2/5 LUE and LLE 5/5 Sensory: Decreased sensation on the right.   Deep Tendon Reflexes: 1+ and symmetric throughout Cerebellar: No ataxia with FNF on the left. Unable to perform on the right Gait: Unable to assess  NIHSS: 13   Results for orders placed or performed during the hospital encounter of 06/21/22 (from the past 48 hour(s))  I-stat chem 8, ED     Status: Abnormal   Collection Time: 06/21/22  5:45 AM  Result Value Ref Range   Sodium 132 (L) 135 - 145 mmol/L   Potassium 3.7 3.5 - 5.1 mmol/L   Chloride 96 (L) 98 - 111 mmol/L   BUN 20 8 - 23 mg/dL   Creatinine, Ser 6.04 0.61 - 1.24 mg/dL   Glucose, Bld 540 (H) 70 - 99 mg/dL    Comment: Glucose reference range applies only to samples taken after fasting for at least 8 hours.   Calcium, Ion 1.09 (L) 1.15 - 1.40 mmol/L   TCO2 25 22 - 32 mmol/L   Hemoglobin 15.6 13.0 - 17.0 g/dL   HCT 98.1 19.1 - 47.8 %   No results found.   Assessment: 72 year old male presenting with acute onset of right sided weakness - Exam reveals right hemiplegia, right facial droop, severe dysarthria and right sided sensory deficits. NIHSS 13.  - Modified Rankin Scale: 0 - LKN 0530 - CT head: Questionable hyperdense left MCA M1, but severe underlying intracranial atherosclerosis so might be artifact. ASPECTS 10. 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 No acute intracranial hemorrhage identified. - Overall presentation is most consistent with acute ischemic stroke within the left MCA territory - After comprehensive review of  possible contraindications, he has no absolute contraindications to TNK administration. - Patient is a TNK candidate. Discussed extensively the risks/benefits of TNK treatment vs. no treatment with the patient, including risks of hemorrhage and death with IV thrombolytic administration versus worse overall outcomes on average in patients within the thrombolysis time window who are not administered TNK. Overall benefits of TNK regarding long-term prognosis are felt to outweigh risks. The patient expressed understanding and wish to proceed with TNK.  - CTA of head and neck: Negative for any complete anterior  circulation large vessel occlusion but Positive for adherent Thrombus in the Left MCA M1 segment resulting in high-grade stenosis. The left MCA bi/trifurcation and left M2 branches remain patent. Superimposed Severe intracranial and cervical ICA atherosclerosis resulting in: - distal Right Vertebral Artery (V4) OCCLUSION. - distal Left Vertebral Artery Severe (V4) stenosis. - Right ICA origin RADIOGRAPHIC STRING SIGN stenosis due to calcified plaque. - Left ICA origin stenosis due to complex plaque estimated 65-70%. - Severe Left ICA supraclinoid stenosis due to calcified plaque. - Stroke risk factors: HLD, HTN, sleep apnea and DM   Recommendations: 1. Admitting to Neuro ICU.  2. Post-TNK order set to include frequent neuro checks and BP management.  3. No antiplatelet medications or anticoagulants for at least 24 hours following TNK.  4. DVT prophylaxis with SCDs.  5. Will need to be started on a statin. Obtain baseline CK level 6. Will need to be started on antiplatelet therapy if follow up CT at 24 hours is negative for hemorrhagic conversion. 7. Carotid ultrasound to further evaluate left ICA atherosclerotic narrowing. Most likely a candidate for left CEA (symptomatic side) and also possibly an elective right CEA.  8. TTE.  9. MRI brain 10. PT/OT/Speech.  11. NPO until passes swallow  evaluation.  12. Sliding scale insulin.  13. Telemetry monitoring 14. Fasting lipid panel, HgbA1c 15. Continue colchicine PRN for his gout  16. Vascular Surgery consult in the AM.     Electronically signed: Dr. Caryl Pina 06/21/2022, 5:51 AM

## 2022-06-22 ENCOUNTER — Inpatient Hospital Stay (HOSPITAL_COMMUNITY): Payer: Medicare HMO

## 2022-06-22 ENCOUNTER — Other Ambulatory Visit: Payer: Self-pay

## 2022-06-22 ENCOUNTER — Encounter (HOSPITAL_COMMUNITY): Payer: Self-pay | Admitting: Neurology

## 2022-06-22 DIAGNOSIS — I63512 Cerebral infarction due to unspecified occlusion or stenosis of left middle cerebral artery: Secondary | ICD-10-CM | POA: Diagnosis not present

## 2022-06-22 DIAGNOSIS — I63032 Cerebral infarction due to thrombosis of left carotid artery: Secondary | ICD-10-CM

## 2022-06-22 LAB — ECHOCARDIOGRAM COMPLETE
Area-P 1/2: 2.86 cm2
Calc EF: 69.3 %
Height: 69 in
MV VTI: 1.83 cm2
S' Lateral: 2.7 cm
Single Plane A2C EF: 65.7 %
Single Plane A4C EF: 73.4 %
Weight: 3520 oz

## 2022-06-22 LAB — GLUCOSE, CAPILLARY
Glucose-Capillary: 208 mg/dL — ABNORMAL HIGH (ref 70–99)
Glucose-Capillary: 186 mg/dL — ABNORMAL HIGH (ref 70–99)
Glucose-Capillary: 212 mg/dL — ABNORMAL HIGH (ref 70–99)
Glucose-Capillary: 198 mg/dL — ABNORMAL HIGH (ref 70–99)
Glucose-Capillary: 141 mg/dL — ABNORMAL HIGH (ref 70–99)

## 2022-06-22 LAB — LIPID PANEL
Cholesterol: 173 mg/dL (ref 0–200)
HDL: 50 mg/dL (ref >40)
LDL Cholesterol: 100 mg/dL — ABNORMAL HIGH (ref 0–99)
Total CHOL/HDL Ratio: 3.5 RATIO
Triglycerides: 114 mg/dL (ref <150)
VLDL: 23 mg/dL (ref 0–40)

## 2022-06-22 MED ORDER — PERFLUTREN LIPID MICROSPHERE
1.0000 mL | INTRAVENOUS | Status: AC | PRN
  Administered 2022-06-22: 3 mL via INTRAVENOUS

## 2022-06-22 MED ORDER — ASPIRIN 81 MG PO TBEC
81.0000 mg | DELAYED_RELEASE_TABLET | Freq: Every day | ORAL | Status: DC
  Administered 2022-06-22 – 2022-06-24 (×3): 81 mg via ORAL
  Filled 2022-06-22 (×3): qty 1

## 2022-06-22 MED ORDER — CLOPIDOGREL BISULFATE 75 MG PO TABS
75.0000 mg | ORAL_TABLET | Freq: Every day | ORAL | Status: DC
  Administered 2022-06-22 – 2022-06-24 (×3): 75 mg via ORAL
  Filled 2022-06-22 (×3): qty 1

## 2022-06-22 MED ORDER — PANTOPRAZOLE SODIUM 40 MG PO TBEC
40.0000 mg | DELAYED_RELEASE_TABLET | Freq: Every day | ORAL | Status: DC
  Administered 2022-06-22 – 2022-06-23 (×2): 40 mg via ORAL
  Filled 2022-06-22 (×2): qty 1

## 2022-06-22 MED ORDER — ENOXAPARIN SODIUM 40 MG/0.4ML IJ SOSY
40.0000 mg | PREFILLED_SYRINGE | INTRAMUSCULAR | Status: DC
  Administered 2022-06-22 – 2022-06-24 (×3): 40 mg via SUBCUTANEOUS
  Filled 2022-06-22 (×3): qty 0.4

## 2022-06-22 NOTE — Progress Notes (Signed)
Modified Barium Swallow Study  Patient Details  Name: Jimmy Valentine MRN: [id number] Date of Birth: Dec 03, 1950  Today's Date: 06/22/2022  Modified Barium Swallow completed.  Full report located under Chart Review in the Imaging Section.  History of Present Illness Patient is a 72 y.o. male with PMH: arthritis, ED, gout, HLD, HTN, sleep apnea, DM, idiopathic sensorineural hearing loss (left). He presented to the ED on 06/21/22 via EMS from home for code stroke after acute onset of right sided weakness. En route to ED, patient spontaneously recovered completely with all symptoms but while still en route, his symptoms recurred. CT head was negative for acute intracranial hemorrhage. He received TNK. MRI brain ordered but not completed at time of SLP evaluation. Neurology reporting that patient's overall presentation is consistent with aute ischemic stroke within the left MCA territory.   Clinical Impression Pt presented with a primary oral dysphagia marked by decreased oral control and sensation, with large portions of barium escaping anteriorly from right side of oral cavity, intermittently without awareness.  Otherwise, pt demonstrated functional pharyngeal squeeze, trace residue in valleculae post-swallow, generally reliable laryngeal vestibule closure. There was a single incident of very trace aspiration of thin liquids when pt was following solids immediately with liquids.  A pill transferred readily through the esophagus.  It was difficult to view the proximal esophagus/PES due to obstruction by shoulder, but it is presumed flow from pharynx to esophagus was functional.  Recommend continuing a dysphagia 3 diet with nectar thick liquids for now. SLP will f/u to teach swallowing strategies to manage oral control and bolus release to pharynx. Anticipate progression to thin liquids in the next few days - pt will not need a repeat MBS> Factors that may increase risk of adverse event in presence of  aspiration Jimmy Valentine & Jimmy Valentine):    Swallow Evaluation Recommendations Recommendations: PO diet PO Diet Recommendation: Dysphagia 3 (Mechanical soft);Mildly thick liquids (Level 2, nectar thick) Liquid Administration via: Cup;Straw Medication Administration: Whole meds with liquid (whole pills with nectar) Supervision: Full assist for feeding Postural changes: Position pt fully upright for meals Oral care recommendations: Oral care BID (2x/day)     Jimmy Valentine L. Jimmy Frederic, MA CCC/SLP Clinical Specialist - Acute Care SLP Acute Rehabilitation Services Office number [phone]  Blenda Mounts Jimmy Valentine 06/22/2022,2:52 PM

## 2022-06-22 NOTE — TOC Initial Note (Signed)
Transition of Care [facility]) - Initial/Assessment Note    Patient Details  Name: Jimmy Valentine MRN: [id number] Date of Birth: 1950-10-08  [facility]) CM/SW Contact:    Mearl Latin, LCSW Phone Number: 06/22/2022, 5:26 PM  Clinical Narrative:                 Patient from home. CIR assessing. Transitions of Care following for TOC needs.   Expected Discharge Plan: IP Rehab Facility Barriers to Discharge: Continued Medical Work up, English as a second language teacher   Patient Goals and CMS Choice            Expected Discharge Plan and Services       Living arrangements for the past 2 months: Single Family Home                                      Prior Living Arrangements/Services Living arrangements for the past 2 months: Single Family Home   Patient language and need for interpreter reviewed:: Yes              Criminal Activity/Legal Involvement Pertinent to Current Situation/Hospitalization: No - Comment as needed  Activities of Daily Living Home Assistive Devices/Equipment: Eyeglasses (readers only) ADL Screening (condition at time of admission) Patient's cognitive ability adequate to safely complete daily activities?: Yes Is the patient deaf or have difficulty hearing?: No Does the patient have difficulty seeing, even when wearing glasses/contacts?: No Does the patient have difficulty concentrating, remembering, or making decisions?: No Patient able to express need for assistance with ADLs?: Yes Does the patient have difficulty dressing or bathing?: No Independently performs ADLs?: Yes (appropriate for developmental age) Does the patient have difficulty walking or climbing stairs?: No Weakness of Legs: None Weakness of Arms/Hands: Right  Permission Sought/Granted                  Emotional Assessment       Orientation: : Oriented to Self, Oriented to Place, Oriented to  Time, Oriented to Situation Alcohol / Substance Use: Not  Applicable Psych Involvement: No (comment)  Admission diagnosis:  Stroke (cerebrum) (HCC) [I63.9] Cerebrovascular accident (CVA), unspecified mechanism (HCC) [I63.9] Patient Active Problem List   Diagnosis Date Noted   Stroke (cerebrum) (HCC) 06/21/2022   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 10/18/2014   Elevated LFTs 04/13/2014   Diabetes (HCC) 09/03/2011   Gout 09/03/2011   OSA (obstructive sleep apnea) 04/22/2011   Annual physical exam 12/04/2010   Hypertension    Hyperlipidemia    Hypogonadism male    Erectile dysfunction    PCP:  Wanda Plump, MD Pharmacy:   [id number] [place], [place] - [address] [place] [address] Phone: [phone] Fax: [phone]     Social Determinants of Health (SDOH) Social History: SDOH Screenings   Food Insecurity: No Food Insecurity (10/01/2020)  Housing: Low Risk  (10/01/2020)  Transportation Needs: No Transportation Needs (10/01/2020)  Alcohol Screen: Low Risk  (10/01/2020)  Depression (PHQ2-9): Low Risk  (03/03/2022)  Financial Resource Strain: Low Risk  (10/01/2020)  Physical Activity: Insufficiently Active (10/01/2020)  Social Connections: Socially Isolated (10/01/2020)  Stress: No Stress Concern Present (10/01/2020)  Tobacco Use: Medium Risk (06/21/2022)   SDOH Interventions:     Readmission Risk Interventions  No data to display

## 2022-06-22 NOTE — Evaluation (Signed)
Physical Therapy Evaluation Patient Details Name: Jimmy Valentine MRN: [id number] DOB: March 11, 1950 Today's Date: 06/22/2022  History of Present Illness  Pt is a 72 y/o male presenting on 5/19 with acute onset R sided weakness. 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  Had TNKase on admission, undergoing work up for vascular surgery intervention.  PMH includes: arthritis, gout, HTN, sleep apnea, DM, idiopathic sensorineural hearing loss (L), B TKA.  Clinical Impression  Patient presents with decreased mobility due to R hemiplegia, decreased R side attention/awareness, decreased sitting balance, decreased activity tolerance and poor safety/deficit awareness.  Currently +2 A for bed to chair transfers.  Previously living alone in one level home completing all ADL/IADL for himself and had one fall in past 6 months with some residual R shoulder injury.   Brother in law present and states he is retired and can assist 24/7 if needed at d/c.  PT will continue to follow in acute setting.  Recommend intensive inpatient rehab at d/c.      Recommendations for follow up therapy are one component of a multi-disciplinary discharge planning process, led by the attending physician.  Recommendations may be updated based on patient status, additional functional criteria and insurance authorization.  Follow Up Recommendations       Assistance Recommended at Discharge Frequent or constant Supervision/Assistance  Patient can return home with the following  Two people to help with walking and/or transfers;Two people to help with bathing/dressing/bathroom;Assistance with feeding;Help with stairs or ramp for entrance;Assistance with cooking/housework;Assist for transportation    Equipment Recommendations Other (comment) (TBA)  Recommendations for Other Services  Rehab consult    Functional Status Assessment Patient has had a recent decline in their functional  status and demonstrates the ability to make significant improvements in function in a reasonable and predictable amount of time.     Precautions / Restrictions Precautions Precautions: Fall Precaution Comments: R inattention; swallow precautions Restrictions Weight Bearing Restrictions: No      Mobility  Bed Mobility Overal bed mobility: Needs Assistance Bed Mobility: Supine to Sit     Supine to sit: HOB elevated, Mod assist, Max assist, +2 for physical assistance     General bed mobility comments: using rail on L and able to sit up with mod A assist for R leg off bed and scooting hips, HOB up about 50 degrees    Transfers Overall transfer level: Needs assistance   Transfers: Bed to chair/wheelchair/BSC       Squat pivot transfers: Max assist, +2 physical assistance     General transfer comment: lifting help and assist to pivot hips with bed pad under him pt reaching to chair and impulsively trying to move unaided initially, cues for safety    Ambulation/Gait                  Stairs            Wheelchair Mobility    Modified Rankin (Stroke Patients Only) Modified Rankin (Stroke Patients Only) Pre-Morbid Rankin Score: No symptoms Modified Rankin: Severe disability     Balance Overall balance assessment: Needs assistance Sitting-balance support: Feet supported Sitting balance-Leahy Scale: Poor Sitting balance - Comments: initially pushing to R but over time able to sit with S after cues, time to orient to upright Postural control: Right lateral lean   Standing balance-Leahy Scale: Zero Standing balance comment: unable to stand upright this session  Pertinent Vitals/Pain Pain Assessment Pain Assessment: Faces Faces Pain Scale: Hurts little more Pain Location: L sciatic pain in sitting on EOB Pain Descriptors / Indicators: Discomfort, Grimacing Pain Intervention(s): Monitored during session, Repositioned     Home Living Family/patient expects to be discharged to:: Private residence Living Arrangements: Alone Available Help at Discharge: Family Type of Home: House Home Access: Stairs to enter Entrance Stairs-Rails: Left Entrance Stairs-Number of Steps: 4   Home Layout: One level Home Equipment: None Additional Comments: brother in law present and states can stay with him    Prior Function Prior Level of Function : Independent/Modified Independent;Driving;History of Falls (last six months)               ADLs Comments: not working, but driving     Hand Dominance   Dominant Hand: Right    Extremity/Trunk Assessment   Upper Extremity Assessment Upper Extremity Assessment: Defer to OT evaluation    Lower Extremity Assessment Lower Extremity Assessment: RLE deficits/detail RLE Deficits / Details: not moving actively to command, noted some stiffness throughout, pushes into resistance with leg flexed    Cervical / Trunk Assessment Cervical / Trunk Assessment: Other exceptions Cervical / Trunk Exceptions: R side retraction in sitting  Communication   Communication: Expressive difficulties  Cognition Arousal/Alertness: Awake/alert Behavior During Therapy: Impulsive Overall Cognitive Status: Impaired/Different from baseline Area of Impairment: Attention, Safety/judgement, Problem solving                   Current Attention Level: Sustained     Safety/Judgement: Decreased awareness of deficits, Decreased awareness of safety   Problem Solving: Slow processing, Difficulty sequencing, Requires tactile cues, Requires verbal cues General Comments: inattentive on R and impulsive        General Comments General comments (skin integrity, edema, etc.): drooling on R and food still in his mouth from breakfast; OT assisted pt for oral hygiene with sponge brush and suction    Exercises     Assessment/Plan    PT Assessment Patient needs continued PT services  PT  Problem List Decreased strength;Decreased balance;Decreased cognition;Decreased safety awareness;Decreased activity tolerance;Decreased mobility;Decreased knowledge of use of DME;Impaired tone       PT Treatment Interventions DME instruction;Functional mobility training;Balance training;Patient/family education;Therapeutic activities;Therapeutic exercise;Gait training;Cognitive remediation;Neuromuscular re-education    PT Goals (Current goals can be found in the Care Plan section)  Acute Rehab PT Goals Patient Stated Goal: to return to independent PT Goal Formulation: With patient/family Time For Goal Achievement: 07/06/22 Potential to Achieve Goals: Good    Frequency Min 4X/week     Co-evaluation               AM-PAC PT "6 Clicks" Mobility  Outcome Measure Help needed turning from your back to your side while in a flat bed without using bedrails?: Total Help needed moving from lying on your back to sitting on the side of a flat bed without using bedrails?: Total Help needed moving to and from a bed to a chair (including a wheelchair)?: Total Help needed standing up from a chair using your arms (e.g., wheelchair or bedside chair)?: Total Help needed to walk in hospital room?: Total Help needed climbing 3-5 steps with a railing? : Total 6 Click Score: 6    End of Session Equipment Utilized During Treatment: Gait belt Activity Tolerance: Patient tolerated treatment well Patient left: in chair;with call bell/phone within reach;with chair alarm set Nurse Communication: Mobility status;Need for lift equipment PT Visit Diagnosis: Other abnormalities of  gait and mobility (R26.89);Muscle weakness (generalized) (M62.81);Hemiplegia and hemiparesis;Other symptoms and signs involving the nervous system (R29.898) Hemiplegia - Right/Left: Right Hemiplegia - dominant/non-dominant: Dominant Hemiplegia - caused by: Cerebral infarction    Time: 1610-9604 PT Time Calculation (min) (ACUTE  ONLY): 33 min   Charges:   PT Evaluation $PT Eval Moderate Complexity: 1 Mod          Cyndi Sourish Allender, PT Acute Rehabilitation Services [phone] 06/22/2022   Elray Mcgregor 06/22/2022, 11:35 AM

## 2022-06-22 NOTE — Progress Notes (Signed)
Report given to [facility].... pt going to [facility] [id number]... waiting on room to be cleaned.

## 2022-06-22 NOTE — Evaluation (Signed)
Occupational Therapy Evaluation Patient Details Name: Jimmy Valentine MRN: [id number] DOB: 1950/11/07 Today's Date: 06/22/2022   History of Present Illness Pt is a 72 y/o male presenting on 5/19 with acute onset R sided weakness. 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  Had TNKase on admission, undergoing work up for vascular surgery intervention.  PMH includes: arthritis, gout, HTN, sleep apnea, DM, idiopathic sensorineural hearing loss (L), B TKA.   Clinical Impression   Patient admitted for above and presents with problem list below.  PTA was independent and driving, living alone.  Has good family support at dc.  He requires mod-max assist +2 for mobility, min to total assist +2 for ADLs.  Pt limited by cognition (impulsive, decreased safety, poor problem solving and awareness), R sided hemiparesis, and impaired balance.  Based on performance today, believe pt will best benefit from continued OT services acutely and after dc at an inpatient setting with >3hrs/day to optimize independence, safety and return to PLOF with ADLs and mobility. Will follow acutely.       Recommendations for follow up therapy are one component of a multi-disciplinary discharge planning process, led by the attending physician.  Recommendations may be updated based on patient status, additional functional criteria and insurance authorization.   Assistance Recommended at Discharge Frequent or constant Supervision/Assistance  Patient can return home with the following Two people to help with walking and/or transfers;Two people to help with bathing/dressing/bathroom;Assistance with cooking/housework;Direct supervision/assist for medications management;Direct supervision/assist for financial management;Assist for transportation;Help with stairs or ramp for entrance    Functional Status Assessment  Patient has had a recent decline in their functional status and  demonstrates the ability to make significant improvements in function in a reasonable and predictable amount of time.  Equipment Recommendations  Other (comment) (defer)    Recommendations for Other Services Rehab consult     Precautions / Restrictions Precautions Precautions: Fall Precaution Comments: R inattention; swallow precautions Restrictions Weight Bearing Restrictions: No      Mobility Bed Mobility Overal bed mobility: Needs Assistance Bed Mobility: Supine to Sit     Supine to sit: HOB elevated, Mod assist, Max assist, +2 for physical assistance     General bed mobility comments: using rail on L and able to sit up with mod A assist for R leg off bed and scooting hips, HOB up about 50 degrees    Transfers Overall transfer level: Needs assistance Equipment used: 2 person hand held assist Transfers: Bed to chair/wheelchair/BSC     Squat pivot transfers: Max assist, +2 physical assistance       General transfer comment: lifting help and assist to pivot hips with bed pad under him pt reaching to chair and impulsively trying to move unaided initially, cues for safety      Balance Overall balance assessment: Needs assistance Sitting-balance support: Feet supported Sitting balance-Leahy Scale: Poor Sitting balance - Comments: initially pushing to R but over time able to sit with S after cues, time to orient to upright Postural control: Right lateral lean   Standing balance-Leahy Scale: Zero Standing balance comment: unable to stand upright this session                           ADL either performed or assessed with clinical judgement   ADL Overall ADL's : Needs assistance/impaired     Grooming: Minimal assistance;Sitting  Upper Body Dressing : Maximal assistance;Sitting   Lower Body Dressing: Total assistance;+2 for physical assistance;+2 for safety/equipment;Sit to/from stand;Sitting/lateral leans   Toilet Transfer: Maximal  assistance;+2 for physical assistance;+2 for safety/equipment;Stand-pivot           Functional mobility during ADLs: Maximal assistance;+2 for physical assistance;+2 for safety/equipment       Vision Baseline Vision/History: 1 Wears glasses (reaading) Ability to See in Adequate Light: 0 Adequate Patient Visual Report: No change from baseline Additional Comments: appears WFL not formally tested     Perception     Praxis      Pertinent Vitals/Pain Pain Assessment Pain Assessment: Faces Faces Pain Scale: Hurts little more Pain Location: L sciatic pain in sitting on EOB Pain Descriptors / Indicators: Discomfort, Grimacing Pain Intervention(s): Limited activity within patient's tolerance, Monitored during session, Repositioned     Hand Dominance Right   Extremity/Trunk Assessment Upper Extremity Assessment Upper Extremity Assessment: RUE deficits/detail RUE Deficits / Details: reports recent fall on R shoulder (nov) but achieving 85% movement back; today pts R UE is flaccid, slightly increased tone in hand. noted middle fingers PIP amputation RUE Sensation: decreased proprioception;decreased light touch RUE Coordination: decreased fine motor;decreased gross motor   Lower Extremity Assessment Lower Extremity Assessment: Defer to PT evaluation RLE Deficits / Details: not moving actively to command, noted some stiffness throughout, pushes into resistance with leg flexed   Cervical / Trunk Assessment Cervical / Trunk Assessment: Other exceptions Cervical / Trunk Exceptions: R side retraction in sitting   Communication Communication Communication: Expressive difficulties   Cognition Arousal/Alertness: Awake/alert Behavior During Therapy: Impulsive Overall Cognitive Status: Impaired/Different from baseline Area of Impairment: Attention, Safety/judgement, Problem solving, Awareness                   Current Attention Level: Sustained     Safety/Judgement:  Decreased awareness of deficits, Decreased awareness of safety Awareness: Emergent Problem Solving: Slow processing, Difficulty sequencing, Requires tactile cues, Requires verbal cues General Comments: inattentive on R and impulsive, follows commands but demonstrates poor awareness to safety     General Comments  pt pocketing food on R side from breakfast, cueing for attention to and cueing for finger sweep    Exercises     Shoulder Instructions      Home Living Family/patient expects to be discharged to:: Private residence Living Arrangements: Alone Available Help at Discharge: Family Type of Home: House Home Access: Stairs to enter Secretary/administrator of Steps: 4 Entrance Stairs-Rails: Left Home Layout: One level     Bathroom Shower/Tub: Producer, television/film/video: Handicapped height Bathroom Accessibility: Yes   Home Equipment: None   Additional Comments: brother in law present and states can stay with him  Lives With: Alone    Prior Functioning/Environment Prior Level of Function : Independent/Modified Independent;Driving;History of Falls (last six months)               ADLs Comments: not working, but driving        OT Problem List: Decreased strength;Decreased range of motion;Decreased activity tolerance;Impaired balance (sitting and/or standing);Impaired vision/perception;Decreased coordination;Decreased cognition;Decreased safety awareness;Decreased knowledge of use of DME or AE;Decreased knowledge of precautions;Cardiopulmonary status limiting activity;Impaired sensation;Impaired tone;Obesity;Impaired UE functional use;Pain      OT Treatment/Interventions: Self-care/ADL training;Neuromuscular education;DME and/or AE instruction;Splinting;Cognitive remediation/compensation;Therapeutic activities;Patient/family education;Balance training    OT Goals(Current goals can be found in the care plan section) Acute Rehab OT Goals Patient Stated Goal: get  better OT Goal Formulation: With patient Time  For Goal Achievement: 07/06/22 Potential to Achieve Goals: Good  OT Frequency: Min 2X/week    Co-evaluation PT/OT/SLP Co-Evaluation/Treatment: Yes Reason for Co-Treatment: For patient/therapist safety;To address functional/ADL transfers   OT goals addressed during session: ADL's and self-care      AM-PAC OT "6 Clicks" Daily Activity     Outcome Measure Help from another person eating meals?: A Little Help from another person taking care of personal grooming?: A Lot Help from another person toileting, which includes using toliet, bedpan, or urinal?: Total Help from another person bathing (including washing, rinsing, drying)?: A Lot Help from another person to put on and taking off regular upper body clothing?: A Lot Help from another person to put on and taking off regular lower body clothing?: Total 6 Click Score: 11   End of Session Equipment Utilized During Treatment: Gait belt Nurse Communication: Mobility status;Need for lift equipment  Activity Tolerance: Patient tolerated treatment well Patient left: in chair;with call bell/phone within reach;with chair alarm set  OT Visit Diagnosis: Repeated falls (R29.6);History of falling (Z91.81);Pain;Other symptoms and signs involving cognitive function;Other symptoms and signs involving the nervous system (R29.898)                Time: 7829-5621 OT Time Calculation (min): 33 min Charges:  OT General Charges $OT Visit: 1 Visit OT Evaluation $OT Eval Moderate Complexity: 1 Mod  Jimmy Valentine, OT Acute Rehabilitation Services Office [phone]   Jimmy Valentine 06/22/2022, 1:07 PM

## 2022-06-22 NOTE — Progress Notes (Signed)
Inpatient Rehab Admissions Coordinator:  ° °Per therapy recommendation,  patient was screened for CIR candidacy by Chanetta Moosman, MS, CCC-SLP. At this time, Pt. Appears to be a a potential candidate for CIR. I will place   order for rehab consult per protocol for full assessment. Please contact me any with questions. ° °Naiyah Klostermann, MS, CCC-SLP °Rehab Admissions Coordinator  °[phone] (celll) °[phone] (office) ° °

## 2022-06-22 NOTE — Progress Notes (Addendum)
STROKE TEAM PROGRESS NOTE   INTERVAL HISTORY Echo and RN at bedside.  On exam today, patient has right facial droop, withdrawals to pain on right leg.  No right upper extremity movement.  Barium swallow completed today--Dysphagia 3 diet.  MRI scan of the brain shows a large left basal ganglia infarct.  Mild petechial hemorrhage. Recommend Asa/Plavix x3 months, then asa. No oac at home prior. Pending bed availability to transfer out of ICU.     Vitals:   06/22/22 0500 06/22/22 0600 06/22/22 0618 06/22/22 0800  BP: (!) 151/84  (!) 147/109   Pulse:  73 84   Resp: 16 19 19    Temp:    97.9 F (36.6 C)  TempSrc:    Oral  SpO2:  94% 97%   Weight:      Height:       CBC:  Recent Labs  Lab 06/21/22 0543 06/21/22 0545  WBC 7.4  --   NEUTROABS 4.1  --   HGB 14.1 15.6  HCT 38.8* 46.0  MCV 89.2  --   PLT 196  --     Basic Metabolic Panel:  Recent Labs  Lab 06/21/22 0543 06/21/22 0545  NA 130* 132*  K 3.8 3.7  CL 96* 96*  CO2 20*  --   GLUCOSE 223* 224*  BUN 20 20  CREATININE 1.07 1.00  CALCIUM 9.0  --     Lipid Panel:  Recent Labs  Lab 06/22/22 0328  CHOL 173  TRIG 114  HDL 50  CHOLHDL 3.5  VLDL 23  LDLCALC 213*   HgbA1c: No results for input(s): "HGBA1C" in the last 168 hours. Urine Drug Screen: No results for input(s): "LABOPIA", "COCAINSCRNUR", "LABBENZ", "AMPHETMU", "THCU", "LABBARB" in the last 168 hours.  Alcohol Level  Recent Labs  Lab 06/21/22 0543  ETH <10    IMAGING past 24 hours MR BRAIN WO CONTRAST  Result Date: 06/22/2022 CLINICAL DATA:  72 year old male code stroke presentation yesterday with right side weakness, adherent thrombus in the left MCA M1. status post T NK. EXAM: MRI HEAD WITHOUT CONTRAST TECHNIQUE: Multiplanar, multiecho pulse sequences of the brain and surrounding structures were obtained without intravenous contrast. COMPARISON:  CT head, CTA head and neck yesterday. Previous brain MRI 05/02/2008. FINDINGS: Brain: Confluent,  curvilinear area of restricted diffusion tracking about 3 cm from the left caudate/corona radiata toward the posterior left lentiform and posterior limb left internal capsule. T2 and FLAIR hyperintensity. Evidence of petechial hemorrhage on SWI in the lentiform Heidelberg classification 1b: HI2, confluent petechiae, no mass effect. No mass effect. No other diffusion restriction. No midline shift, mass effect, evidence of mass lesion, ventriculomegaly, extra-axial collection or acute intracranial hemorrhage. Cervicomedullary junction and pituitary are within normal limits. Volume loss since 2010 appears to be generalized. Outside of the acute finding gray and white matter signal remains fairly normal for age. Minimal other nonspecific white matter T2 and FLAIR hyperintensity. No other chronic cerebral blood products. Brainstem and cerebellum remain normal for age. Vascular: Loss of the distal right vertebral artery flow void in keeping with right vertebral artery occlusion demonstrated by CTA yesterday. This is new compared to 2010. Other Major intracranial vascular flow voids are preserved. Skull and upper cervical spine: Negative for age visible cervical spine. Visualized bone marrow signal is within normal limits. 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 Orbits appear negative. Other: Trace chronic left mastoid air cell effusion is stable. Trace retained secretions in the nasopharynx now. Other visible internal  auditory structures appear grossly normal. Negative visible scalp and face. IMPRESSION: 1. Positive for Left MCA territory lacunar type infarct tracking from the left corona radiata to the lentiform. Petechial hemorrhage (Heidelberg classification 1b: HI2). No malignant hemorrhagic transformation. No significant mass effect. 2. No other acute intracranial abnormality. 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 Electronically Signed   By: Odessa Fleming M.D.   On: 06/22/2022 06:50   VAS US CAROTID  Result Date: 06/21/2022 Carotid Arterial Duplex Study Patient Name:  Jimmy Valentine  Date of Exam:   06/21/2022 Medical Rec #: [id number]        Accession #:    [id number] Date of Birth: 1950-08-12        Patient Gender: M Patient Age:   72 years Exam Location:  [facility] Procedure:      VAS US CAROTID Referring Phys: ERIC LINDZEN --------------------------------------------------------------------------------  Indications:       CVA, Speech disturbance and Weakness.                    CTA of neck indicated right string sign at bulb and left                    60-79% high-grade stenosis at bulb. Risk Factors:      Hypertension, hyperlipidemia, Diabetes. Other Factors:     Sleep apnea, not compliant with C-Pap. Comparison Study:  No prior study on file Performing Technologist: Sherren Kerns RVS  Examination Guidelines: A complete evaluation includes B-mode imaging, spectral Doppler, color Doppler, and power Doppler as needed of all accessible portions of each vessel. Bilateral testing is considered an integral part of a complete examination. Limited examinations for reoccurring indications may be performed as noted.  Right Carotid Findings: +----------+--------+--------+--------+------------------+------------------+           PSV cm/sEDV cm/sStenosisPlaque DescriptionComments           +----------+--------+--------+--------+------------------+------------------+ CCA Prox  138     15                                intimal thickening +----------+--------+--------+--------+------------------+------------------+ CCA Distal128     14                                intimal thickening +----------+--------+--------+--------+------------------+------------------+ ICA Prox  111     22      1-39%   calcific                              +----------+--------+--------+--------+------------------+------------------+ ICA Mid   86      20                                                   +----------+--------+--------+--------+------------------+------------------+ ICA Distal61      13                                                   +----------+--------+--------+--------+------------------+------------------+  ECA       117     10                                                   +----------+--------+--------+--------+------------------+------------------+ +----------+--------+-------+--------+-------------------+           PSV cm/sEDV cmsDescribeArm Pressure (mmHG) +----------+--------+-------+--------+-------------------+ [id number]                                        +----------+--------+-------+--------+-------------------+ +---------+--------+--+--------+-+ VertebralPSV cm/s46EDV cm/s1 +---------+--------+--+--------+-+  Left Carotid Findings: +----------+--------+--------+--------+---------------------+------------------+           PSV cm/sEDV cm/sStenosisPlaque Description   Comments           +----------+--------+--------+--------+---------------------+------------------+ CCA Prox  124     16                                   intimal thickening +----------+--------+--------+--------+---------------------+------------------+ CCA Distal141     16                                   intimal thickening +----------+--------+--------+--------+---------------------+------------------+ ICA Prox  86      12      1-39%   calcific and         Shadowing                                            irregular                               +----------+--------+--------+--------+---------------------+------------------+ ICA Mid   107     25                                                      +----------+--------+--------+--------+---------------------+------------------+ ICA  Distal107     27                                                      +----------+--------+--------+--------+---------------------+------------------+ ECA       169     9                                                       +----------+--------+--------+--------+---------------------+------------------+ +----------+--------+--------+--------+-------------------+           PSV cm/sEDV cm/sDescribeArm Pressure (mmHG) +----------+--------+--------+--------+-------------------+ Subclavian129                                         +----------+--------+--------+--------+-------------------+ +---------+--------+--+--------+--+  VertebralPSV cm/s45EDV cm/s14 +---------+--------+--+--------+--+   Summary: Right Carotid: Velocities in the right ICA are consistent with a 1-39% stenosis. Left Carotid: Velocities in the left ICA are consistent with a 1-39% stenosis. Vertebrals:  Bilateral vertebral arteries demonstrate antegrade flow. Subclavians: Normal flow hemodynamics were seen in bilateral subclavian              arteries. *See table(s) above for measurements and observations.     Preliminary     PHYSICAL EXAM Constitutional: Appears well-developed and well-nourished.  Psych: Affect appropriate to situation, calm and cooperative with exam Eyes: No scleral injection HENT: No OP obstrucion MSK: no joint deformities or swelling Cardiovascular: Normal rate and regular rhythm.  Respiratory: Effort normal, non-labored breathing GI: Soft.  No distension. There is no tenderness.  Skin: WDI  Neuro: Mental Status: Patient is awake, alert, oriented to person, place, month, year, and situation. Patient is able to give a clear and coherent history. No signs of aphasia or neglect. There is mild dysarthria noted.  Cranial Nerves: II: Visual Fields are full. Pupils are equal, round, and reactive to light.   III,IV, VI: EOMI without gaze preference V: Facial sensation is intact and  symmetric to light touch VII: Right facial droop present VIII: Hearing is intact to voice X: Palate elevates symmetrically XI: Shoulder shrug is symmetric. XII: Tongue protrudes midline without atrophy or fasciculations.  Motor: Tone is normal. Bulk is normal.  Fluctuating strength noted on the right upper extremity.  Initially, patient was unable to elevate right upper extremity antigravity.  During assessment, patient did have some fluctuation with intermittent antigravity elevation.  At best patient was able to elevate antigravity with vertical drift to bed.  Right lower extremity had some antigravity movement with 3/5 strength noted. Sensory: Patient reports equal sensation to light touch in bilateral upper and lower extremities. Gait: Unable to assess for patient safety s/p TNK administration   ASSESSMENT/PLAN Jimmy Valentine is a 72 y.o. male with history of arthritis, ED, gout, HLD, HTN, OSA, DM2, and idiopathic sensorineural hearing loss (left) presenting with acute onset right-sided weakness, right-sided facial droop, and dysarthria.  While en route to the ED, patient's symptoms resolved before they again recurred.  Presentation is concerning for left MCA territory ischemic stroke s/p TNK administration.  Stroke:  Left MCA territory infarct due to adherent thrombus in the left MCA M1 segment resulting in high-grade stenosis. 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  Code Stroke CT head questionable hyperdense left MCA M1, but severe underlying intracranial atherosclerosis so may be artifact.  ASPECTS 10.   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  No acute intracranial hemorrhage identified. CTA head & neck negative for any complete anterior circulation LVO but positive for adherent thrombus in the left MCA M1 segment resulting in high-grade stenosis.  The left MCA bi/trifurcation and left M2 branches remain patent.   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  Aortic atherosclerosis. Carotid duplex right and left ICA velocities are consistent with a 1-39% stenosis CTA likely error in read after review and discussion with vascular.  MRI: Left MCA territory large basal ganglia infarct Petechial hemorrhage with no malignant hemorrhagic transformation or significant mass effect  2D Echo: EF 65 to 70%, trivial MVR  LDL 91 HgbA1c 6.7  VTE prophylaxis -lovenox    Diet   DIET DYS 3 Room service appropriate? Yes; Fluid consistency: Nectar Thick   No antithrombotic prior to admission, now on aspirin and Plavix for 3 months due to intracranial atherosclerosis, then aspirin for monotherapy Therapy recommendations: Pending Disposition: Pending  Hypertension Home meds: Norvasc, HCTZ, valsartan Unstable BP <180/105 for 24 hours post-TNK Long-term BP goal normotensive  Hyperlipidemia Home meds: Simvastatin 40 mg LDL 91, goal < 70 Change home statin to atorvastatin 80 mg PO daily for high intensity statin dosing and LDL elevation above goal Continue statin at discharge  Diabetes type II Controlled Home meds: Metformin HgbA1c 6.7, goal < 7.0 CBGs Recent Labs    06/21/22 1841 06/21/22 2130 06/22/22 0813  GLUCAP 130* 141* 186*     SSI as needed Recommend close follow-up with PCP   Other Stroke Risk Factors Advanced Age >/= 31  Former cigarette smoker, quit smoking about 26 years ago Obesity, Body mass index is 32.49 kg/m., BMI >/= 30 associated with increased stroke risk, recommend weight loss, diet and exercise as appropriate  Hx stroke/TIA Coronary artery  disease Obstructive sleep apnea, noton CPAP at home  Other Active Problems Gout on home colchicine  Hospital day # 1   Pt seen by Neuro NP/APP and later by MD. Note/plan to be edited by MD as needed.    Lynnae January, DNP, AGACNP-BC [facility] Please use AMION for contact information & EPIC for messaging.  I have personally obtained history,examined this patient, reviewed notes, independently viewed imaging studies, participated in medical decision making and plan of care.ROS completed by me personally and pertinent positives fully documented  I have made any additions or clarifications directly to the above note. Agree with note above.  Continue strict blood pressure control as per post TNK protocol.  Patient continues to have dysarthria, right facial droop and right hemiplegia.  He has passed swallow eval.  Start dysphagia 3 diet.  Recommend aspirin and Plavix for 3 months followed by aspirin alone and aggressive risk factor modification.  Mobilize out of bed.  Therapy consults.  He will likely need rehab.  May consider outpatient enrollment in [employer] trial if interested.  No family at the bedside. This patient is critically ill and at significant risk of neurological worsening, death and care requires constant monitoring of vital signs, hemodynamics,respiratory and cardiac monitoring, extensive review of multiple databases, frequent neurological assessment, discussion with family, other specialists and medical decision making of high complexity.I have made any additions or clarifications directly to the above note.This critical care time does not reflect procedure time, or teaching time or supervisory time of PA/NP/Med Resident etc but could involve care discussion time.  I spent 30 minutes of neurocritical care time  in the care of  this patient.     Delia Heady, MD Medical Director [facility] Pager: [phone] 06/22/2022 5:14 PM

## 2022-06-23 DIAGNOSIS — G8111 Spastic hemiplegia affecting right dominant side: Secondary | ICD-10-CM | POA: Diagnosis not present

## 2022-06-23 DIAGNOSIS — I63512 Cerebral infarction due to unspecified occlusion or stenosis of left middle cerebral artery: Principal | ICD-10-CM

## 2022-06-23 DIAGNOSIS — I63312 Cerebral infarction due to thrombosis of left middle cerebral artery: Secondary | ICD-10-CM

## 2022-06-23 DIAGNOSIS — I69391 Dysphagia following cerebral infarction: Secondary | ICD-10-CM | POA: Diagnosis not present

## 2022-06-23 LAB — GLUCOSE, CAPILLARY
Glucose-Capillary: 165 mg/dL — ABNORMAL HIGH (ref 70–99)
Glucose-Capillary: 215 mg/dL — ABNORMAL HIGH (ref 70–99)
Glucose-Capillary: 195 mg/dL — ABNORMAL HIGH (ref 70–99)
Glucose-Capillary: 155 mg/dL — ABNORMAL HIGH (ref 70–99)

## 2022-06-23 NOTE — Progress Notes (Signed)
Orthopedic Tech Progress Note Patient Details:  SHAQUIELLE GRANGER Oct 14, 1950 [id number]  Called in order to [employer] for a RESTING HAND SPLINT AND PRAFO BOOT    Patient ID: GARR SUDBURY, male   DOB: [birth year], 72 y.o.   MRN: [id number]  Donald Pore 06/23/2022, 12:41 PM

## 2022-06-23 NOTE — Progress Notes (Signed)
Speech Language Pathology Treatment: Dysphagia (dysarthria)  Patient Details Name: Jimmy Valentine MRN: [id number] DOB: 1950-02-05 Today's Date: 06/23/2022 Time: 5284-1324 SLP Time Calculation (min) (ACUTE ONLY): 29 min  Assessment / Plan / Recommendation Clinical Impression  Jimmy Valentine is demonstrating improvements in speech and swallowing.  Reviewed yesterday's MBS and strategies to minimize spillage and retention within right lateral sulcus.  He was able to use tongue and finger sweep to remove material with good carryover and only one initial cue. He needed cues to pace himself and slow down during breakfast.  No s/s of aspiration.   Speech remains dysarthric - when he slows down, puts a space between each word and allows himself time to reach the speech target, intelligibility improves significantly.  He was able to recognize improvement and self-correct by the end of our session. There is still a mild aphasic component affecting speech production.  Pt has good potential for improvement. SLP will follow while admitted. He would benefit for intensive speech therapy.   HPI HPI: Patient is a 72 y.o. male with PMH: arthritis, ED, gout, HLD, HTN, sleep apnea, DM, idiopathic sensorineural hearing loss (left). He presented to the ED on 06/21/22 via EMS from home for code stroke after acute onset of right sided weakness. En route to ED, patient spontaneously recovered completely with all symptoms but while still en route, his symptoms recurred. CT head was negative for acute intracranial hemorrhage. He received TNK. MRI brain ordered but not completed at time of SLP evaluation. Neurology reporting that patient's overall presentation is consistent with aute ischemic stroke within the left MCA territory. MRI 5/20 confirmed left MCA territory lacunar type infarct tracking from the left corona radiata to the lentiform.      SLP Plan  Continue with current plan of care      Recommendations for follow up  therapy are one component of a multi-disciplinary discharge planning process, led by the attending physician.  Recommendations may be updated based on patient status, additional functional criteria and insurance authorization.    Recommendations  Diet recommendations: Dysphagia 3 (mechanical soft);Nectar-thick liquid Liquids provided via: Cup;Straw Medication Administration: Whole meds with liquid Supervision: Patient able to self feed Compensations: Slow rate;Small sips/bites;Minimize environmental distractions;Lingual sweep for clearance of pocketing                  Oral care BID   Frequent or constant Supervision/Assistance Dysphagia, oropharyngeal phase (R13.12);Dysarthria and anarthria (R47.1)     Continue with current plan of care    Chandrika Sandles L. Samson Frederic, MA CCC/SLP Clinical Specialist - Acute Care SLP Acute Rehabilitation Services Office number [phone]  Blenda Mounts Laurice  06/23/2022, 11:28 AM

## 2022-06-23 NOTE — Progress Notes (Addendum)
Inpatient Rehab Coordinator Note:  I met with patient at bedside to discuss CIR recommendations and goals/expectations of CIR stay.  We reviewed 3 hrs/day of therapy, physician follow up, and average length of stay 2 weeks (dependent upon progress) with goals of likely supervision to min assist.  Pt states he can hire someone to stay with him at discharge if needed.  Also reports good family support and agreeable for me to contact daughter to discuss.  We reviewed need for insurance auth and I can start this today. Will follow.   1415: Met with pt and daughter, Misty Stanley, at bedside to go over above.  Reviewed again need for 24/7 assist at discharge and reviewed Dr. Rosalyn Charters goals of supervision to min assist with mobility/ADLs.  They are in agreement. I will start insurance auth now.   Estill Dooms, PT, DPT Admissions Coordinator [phone] 06/23/22  12:05 PM

## 2022-06-23 NOTE — Progress Notes (Signed)
Physical Therapy Treatment Patient Details Name: Jimmy Valentine MRN: [id number] DOB: Jan 25, 1951 Today's Date: 06/23/2022   History of Present Illness Pt is a 72 y/o male presenting on 5/19 with acute onset R sided weakness. 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  Had TNKase on admission, undergoing work up for vascular surgery intervention.  PMH includes: arthritis, gout, HTN, sleep apnea, DM, idiopathic sensorineural hearing loss (L), B TKA.    PT Comments    Pt progressing towards his physical therapy goals and is motivated to participate. Session focused on static sitting and standing balance, neuromuscular re-education, and ROM. Pt with noted increased R sided tone. Utilized Antony Salmon for pre transfer training; use of visual feedback to promote midline due to right lateral lean. Pt able to correct, but not maintain over time. Patient will benefit from intensive inpatient follow up therapy, >3 hours/day in order to address deficits and maximize functional mobility. Suspect good progress based on motivation and PLOF.    Recommendations for follow up therapy are one component of a multi-disciplinary discharge planning process, led by the attending physician.  Recommendations may be updated based on patient status, additional functional criteria and insurance authorization.  Follow Up Recommendations       Assistance Recommended at Discharge Frequent or constant Supervision/Assistance  Patient can return home with the following Two people to help with walking and/or transfers;Two people to help with bathing/dressing/bathroom;Assistance with feeding;Help with stairs or ramp for entrance;Assistance with cooking/housework;Assist for transportation   Equipment Recommendations  Other (comment) (TBA)    Recommendations for Other Services       Precautions / Restrictions Precautions Precautions: Fall Precaution Comments: R hemiparesis,  inattention Restrictions Weight Bearing Restrictions: No     Mobility  Bed Mobility Overal bed mobility: Needs Assistance Bed Mobility: Rolling, Supine to Sit, Sit to Supine Rolling: Max assist   Supine to sit: Max assist, +2 for physical assistance Sit to supine: Max assist, +2 for physical assistance   General bed mobility comments: Rolling towards left to remove bedpan with maxA. Exiting towards right side of bed to sit up, cues for use of rail, assist at RLE and trunk to execute    Transfers Overall transfer level: Needs assistance Equipment used: Ambulation equipment used Transfers: Sit to/from Stand Sit to Stand: Mod assist, +2 physical assistance           General transfer comment: Pt pulling up with LUE on Stedy to power up; assist for foot placement to widen BOS. Visual/verbal/tactile cueing for midline    Ambulation/Gait                   Stairs             Wheelchair Mobility    Modified Rankin (Stroke Patients Only) Modified Rankin (Stroke Patients Only) Pre-Morbid Rankin Score: No symptoms Modified Rankin: Severe disability     Balance Overall balance assessment: Needs assistance Sitting-balance support: Feet supported Sitting balance-Leahy Scale: Poor Sitting balance - Comments: Right lateral lean                                    Cognition Arousal/Alertness: Awake/alert Behavior During Therapy: WFL for tasks assessed/performed Overall Cognitive Status: Impaired/Different from baseline Area of Impairment: Attention, Safety/judgement, Problem solving, Awareness  Current Attention Level: Sustained     Safety/Judgement: Decreased awareness of deficits, Decreased awareness of safety Awareness: Emergent Problem Solving: Slow processing, Difficulty sequencing, Requires tactile cues, Requires verbal cues General Comments: continues with R inattention, however, benefits from visual cueing.  Decreased carryover        Exercises General Exercises - Lower Extremity Heel Slides: PROM, Right, 5 reps, Supine Hip ABduction/ADduction: PROM, Right, Both, Supine Other Exercises Other Exercises: Standing in Stedy: cervical rotation to R/L x 3 Other Exercises: Supine: bridging with RLE supported x 3    General Comments        Pertinent Vitals/Pain Pain Assessment Pain Assessment: Faces Faces Pain Scale: Hurts a little bit Pain Location: neck Pain Descriptors / Indicators: Sore Pain Intervention(s): Monitored during session    Home Living                          Prior Function            PT Goals (current goals can now be found in the care plan section) Acute Rehab PT Goals Patient Stated Goal: to return to independent Potential to Achieve Goals: Good Progress towards PT goals: Progressing toward goals    Frequency    Min 4X/week      PT Plan Current plan remains appropriate    Co-evaluation              AM-PAC PT "6 Clicks" Mobility   Outcome Measure  Help needed turning from your back to your side while in a flat bed without using bedrails?: A Lot Help needed moving from lying on your back to sitting on the side of a flat bed without using bedrails?: Total Help needed moving to and from a bed to a chair (including a wheelchair)?: Total Help needed standing up from a chair using your arms (e.g., wheelchair or bedside chair)?: Total Help needed to walk in hospital room?: Total Help needed climbing 3-5 steps with a railing? : Total 6 Click Score: 7    End of Session Equipment Utilized During Treatment: Gait belt Activity Tolerance: Patient tolerated treatment well Patient left: in bed;with call bell/phone within reach;with bed alarm set Nurse Communication: Mobility status PT Visit Diagnosis: Other abnormalities of gait and mobility (R26.89);Muscle weakness (generalized) (M62.81);Hemiplegia and hemiparesis;Other symptoms and signs  involving the nervous system (R29.898) Hemiplegia - Right/Left: Right Hemiplegia - dominant/non-dominant: Dominant Hemiplegia - caused by: Cerebral infarction     Time: 1207-1230 PT Time Calculation (min) (ACUTE ONLY): 23 min  Charges:  $Therapeutic Activity: 8-22 mins $Neuromuscular Re-education: 8-22 mins                     Lillia Pauls, PT, DPT Acute Rehabilitation Services Office [phone]    Norval Morton 06/23/2022, 1:07 PM

## 2022-06-23 NOTE — Plan of Care (Signed)
  Problem: Ischemic Stroke/TIA Tissue Perfusion: Goal: Complications of ischemic stroke/TIA will be minimized Outcome: Not Progressing   Problem: Self-Care: Goal: Ability to participate in self-care as condition permits will improve Outcome: Not Progressing Goal: Verbalization of feelings and concerns over difficulty with self-care will improve Outcome: Not Progressing Goal: Ability to communicate needs accurately will improve Outcome: Not Progressing   Problem: Nutrition: Goal: Risk of aspiration will decrease Outcome: Not Progressing   Problem: Activity: Goal: Risk for activity intolerance will decrease Outcome: Not Progressing

## 2022-06-23 NOTE — Progress Notes (Signed)
STROKE TEAM PROGRESS NOTE   INTERVAL HISTORY Patient is lying comfortably in bed.  No complaints.  No new neurological changes On exam today, patient has right facial droop, withdrawals to pain on right leg.  No right upper extremity movement.  He is tolerating-Dysphagia 3 diet.  Therapist recommend inpatient rehab    Vitals:   06/22/22 2130 06/23/22 0318 06/23/22 0804 06/23/22 1102  BP: (!) 164/88 (!) 151/95 (!) 153/80 (!) 165/73  Pulse: 72 80 64 75  Resp: 18 18 18 18   Temp: 97.6 F (36.4 C) 98.4 F (36.9 C) 98.6 F (37 C) 98.3 F (36.8 C)  TempSrc: Oral  Oral Oral  SpO2: 98% 97% 93% 98%  Weight:      Height:       CBC:  Recent Labs  Lab 06/21/22 0543 06/21/22 0545  WBC 7.4  --   NEUTROABS 4.1  --   HGB 14.1 15.6  HCT 38.8* 46.0  MCV 89.2  --   PLT 196  --    Basic Metabolic Panel:  Recent Labs  Lab 06/21/22 0543 06/21/22 0545  NA 130* 132*  K 3.8 3.7  CL 96* 96*  CO2 20*  --   GLUCOSE 223* 224*  BUN 20 20  CREATININE 1.07 1.00  CALCIUM 9.0  --    Lipid Panel:  Recent Labs  Lab 06/22/22 0328  CHOL 173  TRIG 114  HDL 50  CHOLHDL 3.5  VLDL 23  LDLCALC 161*   HgbA1c: No results for input(s): "HGBA1C" in the last 168 hours. Urine Drug Screen: No results for input(s): "LABOPIA", "COCAINSCRNUR", "LABBENZ", "AMPHETMU", "THCU", "LABBARB" in the last 168 hours.  Alcohol Level  Recent Labs  Lab 06/21/22 0543  ETH <10   IMAGING past 24 hours No results found.  PHYSICAL EXAM Constitutional: Mildly obese elderly male not in distress psych: Affect appropriate to situation, calm and cooperative with exam Eyes: No scleral injection HENT: No OP obstrucion MSK: no joint deformities or swelling Cardiovascular: Normal rate and regular rhythm.  Respiratory: Effort normal, non-labored breathing GI: Soft.  No distension. There is no tenderness.  Skin: WDI  Neuro: Mental Status: Patient is awake, alert, oriented to person, place, month, year, and  situation. Patient is able to give a clear and coherent history. No signs of aphasia or neglect. There is mild dysarthria noted.  Cranial Nerves: II: Visual Fields are full. Pupils are equal, round, and reactive to light.   III,IV, VI: EOMI without gaze preference V: Facial sensation is intact and symmetric to light touch VII: Right facial droop present VIII: Hearing is intact to voice X: Palate elevates symmetrically XI: Shoulder shrug is symmetric. XII: Tongue protrudes midline without atrophy or fasciculations.  Motor: Tone is normal. Bulk is normal.  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  Good antigravity strength on the left  sensory: Patient reports equal sensation to light touch in bilateral upper and lower extremities. Gait: Unable to assess for patient safety s/p TNK administration   ASSESSMENT/PLAN [name] is a 72 y.o. male with history of arthritis, ED, gout, HLD, HTN, OSA, DM2, and idiopathic sensorineural hearing loss (left) presenting with acute onset right-sided weakness, right-sided facial droop, and dysarthria.  While en route to the ED, patient's symptoms resolved before they again recurred.  Presentation is concerning for left MCA territory ischemic stroke s/p TNK administration.  Stroke:  Left MCA territory infarct due to adherent thrombus in the left MCA M1 segment resulting in high-grade  stenosis. 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  Code Stroke CT head questionable hyperdense left MCA M1, but severe underlying intracranial atherosclerosis so may be artifact.  ASPECTS 10.   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  No acute intracranial hemorrhage identified. CTA head & neck negative for any complete anterior circulation LVO but positive for adherent thrombus in the left MCA M1 segment resulting in high-grade stenosis.  The left MCA bi/trifurcation and left M2 branches  remain patent.  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  Aortic atherosclerosis. Carotid duplex right and left ICA velocities are consistent with a 1-39% stenosis CTA likely error in read after review and discussion with vascular.  MRI: Left MCA territory large basal ganglia infarct Petechial hemorrhage with no malignant hemorrhagic transformation or significant mass effect  2D Echo: EF 65 to 70%, trivial MVR  LDL 91 HgbA1c 6.7  VTE prophylaxis -lovenox    Diet   DIET DYS 3 Room service appropriate? Yes; Fluid consistency: Nectar Thick   No antithrombotic prior to admission, now on aspirin and Plavix for 3 months due to intracranial atherosclerosis, then aspirin for monotherapy Therapy recommendations: Pending Disposition: Pending  Hypertension Home meds: Norvasc, HCTZ, valsartan Unstable BP <180/105 for 24 hours post-TNK Long-term BP goal normotensive  Hyperlipidemia Home meds: Simvastatin 40 mg LDL 91, goal < 70 Change home statin to atorvastatin 80 mg PO daily for high intensity statin dosing and LDL elevation above goal Continue statin at discharge  Diabetes type II Controlled Home meds: Metformin HgbA1c 6.7, goal < 7.0 CBGs Recent Labs    06/22/22 2210 06/23/22 0620 06/23/22 1103  GLUCAP 141* 165* 215*    SSI as needed Recommend close follow-up with PCP   Other Stroke Risk Factors Advanced Age >/= 84  Former cigarette smoker, quit smoking about 26 years ago Obesity, Body mass index is 32.49 kg/m., BMI >/= 30 associated with increased stroke risk, recommend weight loss, diet and exercise as appropriate  Hx stroke/TIA Coronary  artery disease Obstructive sleep apnea, noton CPAP at home  Other Active Problems Gout on home colchicine  Hospital day # 2     Patient continues to have dysarthria, right facial droop and right hemiplegia.  Continue dysphagia 3 diet.  Recommend aspirin and Plavix for 3 months followed by aspirin alone and aggressive risk factor modification.  Mobilize out of bed.  Therapy consults.  He will likely need rehab.  May consider outpatient enrollment in [employer] trial if interested.  No family at the bedside.    Delia Heady, MD Medical Director [facility] Pager: [phone] 06/23/2022 2:25 PM

## 2022-06-23 NOTE — Consult Note (Signed)
Physical Medicine and Rehabilitation Consult Reason for Consult:right sided weakness and functional deficits Referring Physician: Pearlean Brownie   HPI: Jimmy Valentine is a 72 y.o. male with a history of gout, HTN, OSA, DM2, and hearing loss who presented on 06/21/22 with acute right sided weakness, right facial droop, and associated dysarthria. Pt received TNK. CTA notable for adherent thrombus in the left MCA M1 segment causing a high grade stenosis as well as severe intracranial and cervical ICA atherosclerosis. MRI positive for large MCA infarct involving the left basal ganglia. Pt placed on ASA and plavix for 3 mos followed by ASA alone. Pt was up with therapy yesterday and was max assist for squat-pivot transfers. Gait was not tested. Pt lives alone and was independent prior to admission. He has brother-in-law who apparently can provide assistance at discharge.    Review of Systems  Constitutional:  Negative for fever.  HENT:  Positive for hearing loss.   Eyes:  Negative for blurred vision and double vision.  Respiratory:  Positive for cough.   Cardiovascular:  Negative for chest pain.  Gastrointestinal:  Negative for nausea and vomiting.  Genitourinary:  Negative for dysuria.  Musculoskeletal:  Negative for myalgias.  Skin:  Negative for rash.  Neurological:  Positive for dizziness, sensory change and focal weakness.  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,had a MRI, was prescribed testosterone by endocrinology   Sleep apnea    does't use cpap   Sudden hearing loss, left 2017   idiopathic sensorineural hearing loss   Past Surgical History:  Procedure Laterality Date   TOTAL  KNEE ARTHROPLASTY Right 2005   TOTAL KNEE ARTHROPLASTY  12/04/2011   LEFT-- TOTAL KNEE ARTHROPLASTY;  Surgeon: Eugenia Mcalpine, MD;  Location: [facility];  Service: Orthopedics;  Laterality: Left;   Family History  Problem Relation Age of Onset   Hypertension Mother    Coronary artery disease Mother        M CABG at age 7s   Alzheimer's disease Mother    Diabetes Sister    Colon cancer Neg Hx    Prostate cancer Neg Hx    Stomach cancer Neg Hx    Rectal cancer Neg Hx    Social History:  reports that he quit smoking about 26 years ago. His smoking use included cigarettes. He has a 15.00 pack-year smoking history. He has never used smokeless tobacco. He reports current alcohol use of about 12.0 standard drinks of alcohol per week. He reports that he does not use drugs. Allergies: No Known Allergies Medications Prior to Admission  Medication Sig Dispense Refill   amLODipine (NORVASC) 10 MG tablet TAKE 1 TABLET BY MOUTH EVERY DAY 90 tablet 1   metFORMIN (GLUCOPHAGE) 1000 MG tablet Take 1 tablet (1,000 mg total) by mouth 2 (two) times daily with a meal. 180 tablet 1   simvastatin (ZOCOR) 40 MG tablet TAKE 0.5 TABLETS (20 MG TOTAL) BY MOUTH AT BEDTIME. 45 tablet 1   valsartan (DIOVAN) 320 MG tablet Take 1 tablet (320 mg total) by mouth daily. 90 tablet 1   colchicine 0.6 MG  tablet Take 1 tablet (0.6 mg total) by mouth 2 (two) times daily as needed. (Patient not taking: Reported on 06/21/2022) 60 tablet 6   sildenafil (REVATIO) 20 MG tablet TAKE 3 TO 4 TABLETS BY MOUTH EVERY DAY AS NEEDED (Patient taking differently: Take 60-80 mg by mouth daily as needed (as directed).) 270 tablet 0    Home: Home Living Family/patient expects to be discharged to:: Private residence Living Arrangements: Alone Available Help at Discharge: Family Type of Home: House Home Access: Stairs to enter Secretary/administrator of Steps: 4 Entrance Stairs-Rails: Left Home Layout: One level Bathroom Shower/Tub: Architectural technologist: Handicapped height Bathroom Accessibility: Yes Home Equipment: None Additional Comments: brother in law present and states can stay with him  Lives With: Alone  Functional History: Prior Function Prior Level of Function : Independent/Modified Independent, Driving, History of Falls (last six months) ADLs Comments: not working, but driving Functional Status:  Mobility: Bed Mobility Overal bed mobility: Needs Assistance Bed Mobility: Supine to Sit Supine to sit: HOB elevated, Mod assist, Max assist, +2 for physical assistance General bed mobility comments: using rail on L and able to sit up with mod A assist for R leg off bed and scooting hips, HOB up about 50 degrees Transfers Overall transfer level: Needs assistance Equipment used: 2 person hand held assist Transfers: Bed to chair/wheelchair/BSC Bed to/from chair/wheelchair/BSC transfer type:: Squat pivot Squat pivot transfers: Max assist, +2 physical assistance General transfer comment: lifting help and assist to pivot hips with bed pad under him pt reaching to chair and impulsively trying to move unaided initially, cues for safety      ADL: ADL Overall ADL's : Needs assistance/impaired Grooming: Minimal assistance, Sitting Upper Body Dressing : Maximal assistance, Sitting Lower Body Dressing: Total assistance, +2 for physical assistance, +2 for safety/equipment, Sit to/from stand, Sitting/lateral leans Toilet Transfer: Maximal assistance, +2 for physical assistance, +2 for safety/equipment, Stand-pivot Functional mobility during ADLs: Maximal assistance, +2 for physical assistance, +2 for safety/equipment  Cognition: Cognition Overall Cognitive Status: Impaired/Different from baseline Arousal/Alertness: Awake/alert Orientation Level: Oriented X4 Year: 2024 Month: May Day of Week: Correct Attention: Sustained Sustained Attention: Impaired Sustained Attention Impairment: Verbal complex Awareness:  Impaired Awareness Impairment: Emergent impairment Safety/Judgment: Impaired Cognition Arousal/Alertness: Awake/alert Behavior During Therapy: Impulsive Overall Cognitive Status: Impaired/Different from baseline Area of Impairment: Attention, Safety/judgement, Problem solving, Awareness Current Attention Level: Sustained Safety/Judgement: Decreased awareness of deficits, Decreased awareness of safety Awareness: Emergent Problem Solving: Slow processing, Difficulty sequencing, Requires tactile cues, Requires verbal cues General Comments: inattentive on R and impulsive, follows commands but demonstrates poor awareness to safety  Blood pressure (!) 153/80, pulse 64, temperature 98.6 F (37 C), temperature source Oral, resp. rate 18, height 5\' 9"  (1.753 m), weight 99.8 kg, SpO2 93 %. Physical Exam Constitutional:      Appearance: He is obese.  HENT:     Head: Normocephalic.     Right Ear: External ear normal.     Left Ear: External ear normal.     Nose: Nose normal.  Eyes:     Conjunctiva/sclera: Conjunctivae normal.  Cardiovascular:     Rate and Rhythm: Normal rate and regular rhythm.  Pulmonary:     Effort: Pulmonary effort is normal.     Comments: Occasional wet cough Abdominal:     Palpations: Abdomen is soft.  Musculoskeletal:        General: No swelling.     Cervical back: Normal range of motion.     Right lower  leg: No edema.     Left lower leg: No edema.     Comments: Two prior TKA's. Missing tip of right middle finger  Skin:    General: Skin is warm and dry.  Neurological:     Mental Status: He is alert.     Comments: Alert and oriented x 3. Functional insight and awareness. Functional Memory. Normal language. Speech slurred. Wet vocal quality. PERRL, EOMI. Right central VII and XII. MMT: RUE 0/5 prox to distal. RLE trace HE, KF, KE and 0/5 distally. LUE and LLE grossly 5/5. Slight decrease in LT RUE and to lesser extent RLE. Right arm is slightly sensitive to touch.  DTR's difficult to elicit in RUE. RLE DTR's are 3+. Early flexor tone right wrist, fingers and extensor tone RLE.  Psychiatric:     Comments: Teared up somewhat when we discussed recovery from stroke/rehab stay. Otherwise very appropriate and pleasant.      Results for orders placed or performed during the hospital encounter of 06/21/22 (from the past 24 hour(s))  Glucose, capillary     Status: Abnormal   Collection Time: 06/22/22 12:04 PM  Result Value Ref Range   Glucose-Capillary 212 (H) 70 - 99 mg/dL  Glucose, capillary     Status: Abnormal   Collection Time: 06/22/22  5:35 PM  Result Value Ref Range   Glucose-Capillary 198 (H) 70 - 99 mg/dL  Glucose, capillary     Status: Abnormal   Collection Time: 06/22/22 10:10 PM  Result Value Ref Range   Glucose-Capillary 141 (H) 70 - 99 mg/dL  Glucose, capillary     Status: Abnormal   Collection Time: 06/23/22  6:20 AM  Result Value Ref Range   Glucose-Capillary 165 (H) 70 - 99 mg/dL   Comment 1 Notify RN    Comment 2 Document in Chart    DG Swallowing Func-Speech Pathology  Result Date: 06/22/2022 Table formatting from the original result was not included. Modified Barium Swallow Study Patient Details Name: Jimmy Valentine MRN: [id number] Date of Birth: 06/21/1950 Today's Date: 06/22/2022 HPI/PMH: HPI: Patient is a 72 y.o. male with PMH: arthritis, ED, gout, HLD, HTN, sleep apnea, DM, idiopathic sensorineural hearing loss (left). He presented to the ED on 06/21/22 via EMS from home for code stroke after acute onset of right sided weakness. En route to ED, patient spontaneously recovered completely with all symptoms but while still en route, his symptoms recurred. CT head was negative for acute intracranial hemorrhage. He received TNK. MRI brain ordered but not completed at time of SLP evaluation. Neurology reporting that patient's overall presentation is consistent with aute ischemic stroke within the left MCA territory. Clinical Impression:  Clinical Impression: Pt presented with a primary oral dysphagia marked by decreased oral control and sensation, with large portions of barium escaping anteriorly from right side of oral cavity, intermittently without awareness.  Otherwise, pt demonstrated functional pharyngeal squeeze, trace residue in valleculae post-swallow, generally reliable laryngeal vestibule closure. There was a single incident of very trace aspiration of thin liquids when pt was following solids immediately with liquids.  A pill transferred readily through the esophagus.  It was difficult to view the proximal esophagus/PES due to obstruction by shoulder, but it is presumed flow from pharynx to esophagus was functional.  Recommend continuing a dysphagia 3 diet with nectar thick liquids for now. SLP will f/u to teach swallowing strategies to manage oral control and bolus release to pharynx. Anticipate progression to thin liquids in the next few days -  pt will not need a repeat MBS> Factors that may increase risk of adverse event in presence of aspiration Rubye Oaks & Clearance Coots 2021): No data recorded Recommendations/Plan: Swallowing Evaluation Recommendations Swallowing Evaluation Recommendations Recommendations: PO diet PO Diet Recommendation: Dysphagia 3 (Mechanical soft); Mildly thick liquids (Level 2, nectar thick) Liquid Administration via: Cup; Straw Medication Administration: Whole meds with liquid (whole pills with nectar) Supervision: Full assist for feeding Postural changes: Position pt fully upright for meals Oral care recommendations: Oral care BID (2x/day) Treatment Plan Treatment Plan Treatment recommendations: Therapy as outlined in treatment plan below Functional status assessment: Patient has had a recent decline in their functional status and demonstrates the ability to make significant improvements in function in a reasonable and predictable amount of time. Treatment frequency: Min 2x/week Treatment duration: 2 weeks  Recommendations Recommendations for follow up therapy are one component of a multi-disciplinary discharge planning process, led by the attending physician.  Recommendations may be updated based on patient status, additional functional criteria and insurance authorization. Assessment: Orofacial Exam: Orofacial Exam Oral Cavity - Dentition: Adequate natural dentition Orofacial Anatomy: WFL Oral Motor/Sensory Function: Suspected cranial nerve impairment CN V - Trigeminal: Not tested CN VII - Facial: Right motor impairment CN XII - Hypoglossal: WFL Anatomy: Anatomy: WFL Boluses Administered: Boluses Administered Boluses Administered: Thin liquids (Level 0); Mildly thick liquids (Level 2, nectar thick); Moderately thick liquids (Level 3, honey thick); Puree; Solid  Oral Impairment Domain: Oral Impairment Domain Lip Closure: Escape beyond mid-chin Tongue control during bolus hold: Escape to lateral buccal cavity/floor of mouth Bolus preparation/mastication: Slow prolonged chewing/mashing with complete recollection Bolus transport/lingual motion: Brisk tongue motion Oral residue: Trace residue lining oral structures Location of oral residue : Tongue Initiation of pharyngeal swallow : Pyriform sinuses  Pharyngeal Impairment Domain: Pharyngeal Impairment Domain Soft palate elevation: No bolus between soft palate (SP)/pharyngeal wall (PW) Laryngeal elevation: Complete superior movement of thyroid cartilage with complete approximation of arytenoids to epiglottic petiole Anterior hyoid excursion: Complete anterior movement Epiglottic movement: Complete inversion Laryngeal vestibule closure: Incomplete, narrow column air/contrast in laryngeal vestibule Pharyngeal stripping wave : Present - diminished Pharyngeal contraction (A/P view only): N/A Pharyngoesophageal segment opening: -- (difficult to visualize due to obstruction by shoulder) Tongue base retraction: Trace column of contrast or air between tongue base and PPW  Pharyngeal residue: Trace residue within or on pharyngeal structures Location of pharyngeal residue: Valleculae  Esophageal Impairment Domain: Esophageal Impairment Domain Esophageal clearance upright position: Complete clearance, esophageal coating Pill: Esophageal Impairment Domain Esophageal clearance upright position: Complete clearance, esophageal coating Penetration/Aspiration Scale Score: Penetration/Aspiration Scale Score 1.  Material does not enter airway: Mildly thick liquids (Level 2, nectar thick); Moderately thick liquids (Level 3, honey thick); Puree; Solid; Pill 7.  Material enters airway, passes BELOW cords and not ejected out despite cough attempt by patient: Thin liquids (Level 0) Compensatory Strategies: Compensatory Strategies Compensatory strategies: Yes Straw: Effective Effective Straw: Thin liquid (Level 0); Mildly thick liquid (Level 2, nectar thick)   General Information: Caregiver present: No  Diet Prior to this Study: Dysphagia 3 (mechanical soft); Mildly thick liquids (Level 2, nectar thick)   Temperature : Normal   Respiratory Status: WFL   Supplemental O2: None (Room air)   History of Recent Intubation: No  Behavior/Cognition: Alert; Cooperative Self-Feeding Abilities: Able to self-feed Baseline vocal quality/speech: Normal Volitional Cough: Unable to elicit Volitional Swallow: Able to elicit Exam Limitations: Limited visibility Goal Planning: Prognosis for improved oropharyngeal function: Good No data recorded No data recorded Patient/Family Stated Goal:  patient is thirsty Consulted and agree with results and recommendations: Patient Pain: Pain Assessment Pain Assessment: No/denies pain Faces Pain Scale: 4 Pain Location: L sciatic pain in sitting on EOB Pain Descriptors / Indicators: Discomfort; Grimacing Pain Intervention(s): Limited activity within patient's tolerance; Monitored during session; Repositioned End of Session: Start Time:SLP Start Time (ACUTE ONLY): 1306 Stop Time: SLP  Stop Time (ACUTE ONLY): 1329 Time Calculation:SLP Time Calculation (min) (ACUTE ONLY): 23 min Charges: SLP Evaluations $ SLP Speech Visit: 1 Visit SLP Evaluations $BSS Swallow: 1 Procedure $MBS Swallow: 1 Procedure $ SLP EVAL LANGUAGE/SOUND PRODUCTION: 1 Procedure SLP visit diagnosis: SLP Visit Diagnosis: Dysphagia, oropharyngeal phase (R13.12) Past Medical History: Past Medical History: Diagnosis Date  Arthritis   DM2 (diabetes mellitus, type 2) (HCC)   Elevated LFTs   Erectile dysfunction   Gout 09/03/2011  RF colchicine    Hyperlipidemia 1990s  Hypertension 1980s  Hypogonadism male   Secondary hypogonadism,had a MRI, was prescribed testosterone by endocrinology  Sleep apnea   does't use cpap  Sudden hearing loss, left 2017  idiopathic sensorineural hearing loss Past Surgical History: Past Surgical History: Procedure Laterality Date  TOTAL KNEE ARTHROPLASTY Right 2005  TOTAL KNEE ARTHROPLASTY  12/04/2011  LEFT-- TOTAL KNEE ARTHROPLASTY;  Surgeon: Eugenia Mcalpine, MD;  Location: [facility];  Service: Orthopedics;  Laterality: Left; Carolan Shiver 06/22/2022, 2:53 PM Amanda L. Samson Frederic, MA CCC/SLP Clinical Specialist - Acute Care SLP Acute Rehabilitation Services Office number [phone]  ECHOCARDIOGRAM COMPLETE  Result Date: 06/22/2022    ECHOCARDIOGRAM REPORT   Patient Name:   Jimmy Valentine Date of Exam: 06/22/2022 Medical Rec #:  [id number]       Height:       69.0 in Accession #:    [id number]      Weight:       220.0 lb Date of Birth:  [birth year]       BSA:          2.151 m Patient Age:    72 years        BP:           156/107 mmHg Patient Gender: M               HR:           60 bpm. Exam Location:  Inpatient Procedure: 2D Echo, Cardiac Doppler, Color Doppler and Intracardiac            Opacification Agent Indications:    Stroke  History:        Patient has prior history of Echocardiogram examinations, most                 recent 12/09/2017. Risk Factors:Sleep Apnea, Hypertension,                  Diabetes and Dyslipidemia.  Sonographer:    Milda Smart Referring Phys: [phone] ERIC LINDZEN  Sonographer Comments: Technically difficult study due to poor echo windows. Image acquisition challenging due to patient body habitus and Image acquisition challenging due to respiratory motion. Mid-intracavitary gradient obtained . IMPRESSIONS  1. Left ventricular ejection fraction, by estimation, is 65 to 70%. The left ventricle has normal function. The left ventricle has no regional wall motion abnormalities. There is mild concentric left ventricular hypertrophy. Left ventricular diastolic parameters are indeterminate.  2. Right ventricular systolic function is normal. The right ventricular size is normal.  3. The mitral valve is grossly normal. Trivial mitral valve regurgitation. No evidence of mitral stenosis.  4. The aortic valve has an indeterminant number of cusps. There is mild calcification of the aortic valve. There is mild thickening of the aortic valve. Aortic valve regurgitation is not visualized. Aortic valve sclerosis is present, with no evidence of  aortic valve stenosis.  5. The inferior vena cava is normal in size with greater than 50% respiratory variability, suggesting right atrial pressure of 3 mmHg. Comparison(s): Prior images unable to be directly viewed, comparison made by report only. Conclusion(s)/Recommendation(s): Otherwise normal echocardiogram, with minor abnormalities described in the report. FINDINGS  Left Ventricle: Left ventricular ejection fraction, by estimation, is 65 to 70%. The left ventricle has normal function. The left ventricle has no regional wall motion abnormalities. Definity contrast agent was given IV to delineate the left ventricular  endocardial borders. The left ventricular internal cavity size was normal in size. There is mild concentric left ventricular hypertrophy. Left ventricular diastolic parameters are indeterminate. Right Ventricle: The right ventricular size  is normal. Right vetricular wall thickness was not well visualized. Right ventricular systolic function is normal. Left Atrium: Left atrial size was normal in size. Right Atrium: Right atrial size was normal in size. Pericardium: There is no evidence of pericardial effusion. Mitral Valve: The mitral valve is grossly normal. Trivial mitral valve regurgitation. No evidence of mitral valve stenosis. MV peak gradient, 3.2 mmHg. The mean mitral valve gradient is 2.0 mmHg. Tricuspid Valve: The tricuspid valve is grossly normal. Tricuspid valve regurgitation is trivial. No evidence of tricuspid stenosis. Aortic Valve: The aortic valve has an indeterminant number of cusps. There is mild calcification of the aortic valve. There is mild thickening of the aortic valve. Aortic valve regurgitation is not visualized. Aortic valve sclerosis is present, with no evidence of aortic valve stenosis. Pulmonic Valve: The pulmonic valve was not well visualized. Pulmonic valve regurgitation is trivial. No evidence of pulmonic stenosis. Aorta: The aortic root, ascending aorta, aortic arch and descending aorta are all structurally normal, with no evidence of dilitation or obstruction. Venous: The inferior vena cava is normal in size with greater than 50% respiratory variability, suggesting right atrial pressure of 3 mmHg. IAS/Shunts: The atrial septum is grossly normal.  LEFT VENTRICLE PLAX 2D LVIDd:         4.90 cm     Diastology LVIDs:         2.70 cm     LV e' medial:    4.95 cm/s LV PW:         1.20 cm     LV E/e' medial:  15.8 LV IVS:        1.30 cm     LV e' lateral:   6.61 cm/s LVOT diam:     2.00 cm     LV E/e' lateral: 11.8 LV SV:         52 LV SV Index:   24 LVOT Area:     3.14 cm  LV Volumes (MOD) LV vol d, MOD A2C: 49.5 ml LV vol d, MOD A4C: 44.7 ml LV vol s, MOD A2C: 17.0 ml LV vol s, MOD A4C: 11.9 ml LV SV MOD A2C:     32.5 ml LV SV MOD A4C:     44.7 ml LV SV MOD BP:      32.6 ml RIGHT VENTRICLE RV S prime:     9.89 cm/s TAPSE  (M-mode): 2.2 cm LEFT ATRIUM             Index  RIGHT ATRIUM           Index LA diam:        2.70 cm 1.25 cm/m   RA Area:     13.80 cm LA Vol (A2C):   36.5 ml 16.97 ml/m  RA Volume:   33.00 ml  15.34 ml/m LA Vol (A4C):   28.9 ml 13.43 ml/m LA Biplane Vol: 34.3 ml 15.94 ml/m  AORTIC VALVE LVOT Vmax:   104.30 cm/s LVOT Vmean:  74.950 cm/s LVOT VTI:    0.166 m  AORTA Ao Root diam: 3.30 cm Ao Asc diam:  3.20 cm MITRAL VALVE MV Area (PHT): 2.86 cm    SHUNTS MV Area VTI:   1.83 cm    Systemic VTI:  0.17 m MV Peak grad:  3.2 mmHg    Systemic Diam: 2.00 cm MV Mean grad:  2.0 mmHg MV Vmax:       0.90 m/s MV Vmean:      60.9 cm/s MV Decel Time: 266 msec MV E velocity: 78.10 cm/s MV A velocity: 72.50 cm/s MV E/A ratio:  1.08 Jodelle Red MD Electronically signed by Jodelle Red MD Signature Date/Time: 06/22/2022/11:32:04 AM    Final    MR BRAIN WO CONTRAST  Result Date: 06/22/2022 CLINICAL DATA:  72 year old male code stroke presentation yesterday with right side weakness, adherent thrombus in the left MCA M1. status post T NK. EXAM: MRI HEAD WITHOUT CONTRAST TECHNIQUE: Multiplanar, multiecho pulse sequences of the brain and surrounding structures were obtained without intravenous contrast. COMPARISON:  CT head, CTA head and neck yesterday. Previous brain MRI 05/02/2008. FINDINGS: Brain: Confluent, curvilinear area of restricted diffusion tracking about 3 cm from the left caudate/corona radiata toward the posterior left lentiform and posterior limb left internal capsule. T2 and FLAIR hyperintensity. Evidence of petechial hemorrhage on SWI in the lentiform Heidelberg classification 1b: HI2, confluent petechiae, no mass effect. No mass effect. No other diffusion restriction. No midline shift, mass effect, evidence of mass lesion, ventriculomegaly, extra-axial collection or acute intracranial hemorrhage. Cervicomedullary junction and pituitary are within normal limits. Volume loss since 2010  appears to be generalized. Outside of the acute finding gray and white matter signal remains fairly normal for age. Minimal other nonspecific white matter T2 and FLAIR hyperintensity. No other chronic cerebral blood products. Brainstem and cerebellum remain normal for age. Vascular: Loss of the distal right vertebral artery flow void in keeping with right vertebral artery occlusion demonstrated by CTA yesterday. This is new compared to 2010. Other Major intracranial vascular flow voids are preserved. Skull and upper cervical spine: Negative for age visible cervical spine. Visualized bone marrow signal is within normal limits. 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 Orbits appear negative. Other: Trace chronic left mastoid air cell effusion is stable. Trace retained secretions in the nasopharynx now. Other visible internal auditory structures appear grossly normal. Negative visible scalp and face. IMPRESSION: 1. Positive for Left MCA territory lacunar type infarct tracking from the left corona radiata to the lentiform. Petechial hemorrhage (Heidelberg classification 1b: HI2). No malignant hemorrhagic transformation. No significant mass effect. 2. No other acute intracranial abnormality. 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 Electronically Signed   By: Odessa Fleming M.D.   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   Does  the need for close, 24 hr/day medical supervision in concert with the patient's rehab needs make it unreasonable for this patient to be served in a less intensive setting? Yes Co-Morbidities requiring supervision/potential complications:  -dysphagia, nutrition -HTN -Obstructive sleep apnea -Type II diabetes Due to bladder  management, bowel management, safety, skin/wound care, disease management, medication administration, pain management, and patient education, does the patient require 24 hr/day rehab nursing? Yes Does the patient require coordinated care of a physician, rehab nurse, therapy disciplines of PT, OT, SLP to address physical and functional deficits in the context of the above medical diagnosis(es)? Yes Addressing deficits in the following areas: balance, endurance, locomotion, strength, transferring, bowel/bladder control, bathing, dressing, feeding, grooming, toileting, cognition, speech, swallowing, and psychosocial support Can the patient actively participate in an intensive therapy program of at least 3 hrs of therapy per day at least 5 days per week? Yes The potential for patient to make measurable gains while on inpatient rehab is excellent Anticipated functional outcomes upon discharge from inpatient rehab are supervision and min assist  with PT, supervision and min assist with OT, modified independent and supervision with SLP. Estimated rehab length of stay to reach the above functional goals is: 20-25 days Anticipated discharge destination: Home Overall Rehab/Functional Prognosis: excellent  POST ACUTE RECOMMENDATIONS: This patient's condition is appropriate for continued rehabilitative care in the following setting: CIR Patient has agreed to participate in recommended program. Yes Note that insurance prior authorization may be required for reimbursement for recommended care.  Comment: Rehab Admissions Coordinator to follow up    MEDICAL RECOMMENDATIONS: Pt is developing early hypertonicity in the right upper and lower extremities -will order WHO and PRAFO to be worn while in bed.    I have personally performed a face to face diagnostic evaluation of this patient. Additionally, I have examined the patient's medical record including any pertinent labs and radiographic images. If the  physician assistant has documented in this note, I have reviewed and edited or otherwise concur with the physician assistant's documentation.  Thanks,  Ranelle Oyster, MD 06/23/2022

## 2022-06-24 ENCOUNTER — Inpatient Hospital Stay (HOSPITAL_COMMUNITY)
Admission: RE | Admit: 2022-06-24 | Discharge: 2022-07-30 | DRG: 057 | Disposition: A | Discharge status: Home Health | Payer: Medicare HMO | Source: Intra-hospital | Attending: Physical Medicine & Rehabilitation | Admitting: Physical Medicine & Rehabilitation

## 2022-06-24 ENCOUNTER — Encounter (HOSPITAL_COMMUNITY): Payer: Self-pay | Admitting: Physical Medicine & Rehabilitation

## 2022-06-24 ENCOUNTER — Other Ambulatory Visit: Payer: Self-pay

## 2022-06-24 DIAGNOSIS — I639 Cerebral infarction, unspecified: Secondary | ICD-10-CM | POA: Diagnosis not present

## 2022-06-24 DIAGNOSIS — Z6831 Body mass index (BMI) 31.0-31.9, adult: Secondary | ICD-10-CM

## 2022-06-24 DIAGNOSIS — E871 Hypo-osmolality and hyponatremia: Secondary | ICD-10-CM | POA: Diagnosis not present

## 2022-06-24 DIAGNOSIS — E669 Obesity, unspecified: Secondary | ICD-10-CM | POA: Diagnosis present

## 2022-06-24 DIAGNOSIS — I69391 Dysphagia following cerebral infarction: Secondary | ICD-10-CM | POA: Diagnosis not present

## 2022-06-24 DIAGNOSIS — I69322 Dysarthria following cerebral infarction: Secondary | ICD-10-CM | POA: Diagnosis not present

## 2022-06-24 DIAGNOSIS — I6300 Cerebral infarction due to thrombosis of unspecified precerebral artery: Secondary | ICD-10-CM | POA: Diagnosis not present

## 2022-06-24 DIAGNOSIS — M5432 Sciatica, left side: Secondary | ICD-10-CM | POA: Diagnosis present

## 2022-06-24 DIAGNOSIS — I69351 Hemiplegia and hemiparesis following cerebral infarction affecting right dominant side: Secondary | ICD-10-CM | POA: Diagnosis not present

## 2022-06-24 DIAGNOSIS — I63512 Cerebral infarction due to unspecified occlusion or stenosis of left middle cerebral artery: Secondary | ICD-10-CM | POA: Diagnosis not present

## 2022-06-24 DIAGNOSIS — Z794 Long term (current) use of insulin: Secondary | ICD-10-CM | POA: Diagnosis not present

## 2022-06-24 DIAGNOSIS — I69392 Facial weakness following cerebral infarction: Secondary | ICD-10-CM

## 2022-06-24 DIAGNOSIS — F17290 Nicotine dependence, other tobacco product, uncomplicated: Secondary | ICD-10-CM | POA: Diagnosis present

## 2022-06-24 DIAGNOSIS — E119 Type 2 diabetes mellitus without complications: Secondary | ICD-10-CM | POA: Diagnosis not present

## 2022-06-24 DIAGNOSIS — R471 Dysarthria and anarthria: Secondary | ICD-10-CM | POA: Diagnosis present

## 2022-06-24 DIAGNOSIS — H9042 Sensorineural hearing loss, unilateral, left ear, with unrestricted hearing on the contralateral side: Secondary | ICD-10-CM | POA: Diagnosis present

## 2022-06-24 DIAGNOSIS — Z833 Family history of diabetes mellitus: Secondary | ICD-10-CM | POA: Diagnosis not present

## 2022-06-24 DIAGNOSIS — I69398 Other sequelae of cerebral infarction: Secondary | ICD-10-CM

## 2022-06-24 DIAGNOSIS — Z8249 Family history of ischemic heart disease and other diseases of the circulatory system: Secondary | ICD-10-CM | POA: Diagnosis not present

## 2022-06-24 DIAGNOSIS — M109 Gout, unspecified: Secondary | ICD-10-CM | POA: Diagnosis not present

## 2022-06-24 DIAGNOSIS — I69320 Aphasia following cerebral infarction: Secondary | ICD-10-CM

## 2022-06-24 DIAGNOSIS — I672 Cerebral atherosclerosis: Secondary | ICD-10-CM | POA: Diagnosis present

## 2022-06-24 DIAGNOSIS — R4701 Aphasia: Secondary | ICD-10-CM | POA: Diagnosis not present

## 2022-06-24 DIAGNOSIS — I10 Essential (primary) hypertension: Secondary | ICD-10-CM | POA: Diagnosis present

## 2022-06-24 DIAGNOSIS — Z96651 Presence of right artificial knee joint: Secondary | ICD-10-CM | POA: Diagnosis present

## 2022-06-24 DIAGNOSIS — Z820 Family history of epilepsy and other diseases of the nervous system: Secondary | ICD-10-CM

## 2022-06-24 DIAGNOSIS — Z79899 Other long term (current) drug therapy: Secondary | ICD-10-CM

## 2022-06-24 DIAGNOSIS — R29810 Facial weakness: Secondary | ICD-10-CM | POA: Diagnosis present

## 2022-06-24 DIAGNOSIS — I63312 Cerebral infarction due to thrombosis of left middle cerebral artery: Secondary | ICD-10-CM | POA: Diagnosis not present

## 2022-06-24 DIAGNOSIS — M25531 Pain in right wrist: Secondary | ICD-10-CM | POA: Diagnosis not present

## 2022-06-24 DIAGNOSIS — M7989 Other specified soft tissue disorders: Secondary | ICD-10-CM | POA: Diagnosis not present

## 2022-06-24 DIAGNOSIS — F17200 Nicotine dependence, unspecified, uncomplicated: Secondary | ICD-10-CM

## 2022-06-24 DIAGNOSIS — Z602 Problems related to living alone: Secondary | ICD-10-CM | POA: Diagnosis present

## 2022-06-24 DIAGNOSIS — K5900 Constipation, unspecified: Secondary | ICD-10-CM | POA: Diagnosis present

## 2022-06-24 DIAGNOSIS — E1169 Type 2 diabetes mellitus with other specified complication: Secondary | ICD-10-CM

## 2022-06-24 DIAGNOSIS — Z7984 Long term (current) use of oral hypoglycemic drugs: Secondary | ICD-10-CM

## 2022-06-24 DIAGNOSIS — E785 Hyperlipidemia, unspecified: Secondary | ICD-10-CM | POA: Diagnosis not present

## 2022-06-24 DIAGNOSIS — N529 Male erectile dysfunction, unspecified: Secondary | ICD-10-CM | POA: Diagnosis present

## 2022-06-24 DIAGNOSIS — G4730 Sleep apnea, unspecified: Secondary | ICD-10-CM | POA: Diagnosis present

## 2022-06-24 DIAGNOSIS — K5901 Slow transit constipation: Secondary | ICD-10-CM | POA: Diagnosis not present

## 2022-06-24 DIAGNOSIS — M1990 Unspecified osteoarthritis, unspecified site: Secondary | ICD-10-CM | POA: Diagnosis present

## 2022-06-24 DIAGNOSIS — M189 Osteoarthritis of first carpometacarpal joint, unspecified: Secondary | ICD-10-CM | POA: Diagnosis not present

## 2022-06-24 LAB — GLUCOSE, CAPILLARY
Glucose-Capillary: 159 mg/dL — ABNORMAL HIGH (ref 70–99)
Glucose-Capillary: 161 mg/dL — ABNORMAL HIGH (ref 70–99)
Glucose-Capillary: 200 mg/dL — ABNORMAL HIGH (ref 70–99)
Glucose-Capillary: 159 mg/dL — ABNORMAL HIGH (ref 70–99)
Glucose-Capillary: 162 mg/dL — ABNORMAL HIGH (ref 70–99)

## 2022-06-24 MED ORDER — ATORVASTATIN CALCIUM 80 MG PO TABS
80.0000 mg | ORAL_TABLET | Freq: Every day | ORAL | Status: DC
  Administered 2022-06-24 – 2022-07-29 (×36): 80 mg via ORAL
  Filled 2022-06-24 (×37): qty 1

## 2022-06-24 MED ORDER — ENOXAPARIN SODIUM 40 MG/0.4ML IJ SOSY: 40.0000 mg | PREFILLED_SYRINGE | INTRAMUSCULAR | Status: DC

## 2022-06-24 MED ORDER — METHOCARBAMOL 500 MG PO TABS
500.0000 mg | ORAL_TABLET | Freq: Four times a day (QID) | ORAL | Status: DC | PRN
  Administered 2022-06-25: 500 mg via ORAL
  Filled 2022-06-24: qty 1

## 2022-06-24 MED ORDER — ONDANSETRON HCL 4 MG PO TABS: 4.0000 mg | ORAL_TABLET | Freq: Four times a day (QID) | ORAL | Status: DC | PRN

## 2022-06-24 MED ORDER — GUAIFENESIN-DM 100-10 MG/5ML PO SYRP
10.0000 mL | ORAL_SOLUTION | Freq: Four times a day (QID) | ORAL | Status: DC | PRN
  Administered 2022-06-25 – 2022-07-28 (×4): 10 mL via ORAL
  Filled 2022-06-24 (×5): qty 10

## 2022-06-24 MED ORDER — PANTOPRAZOLE SODIUM 40 MG PO TBEC
40.0000 mg | DELAYED_RELEASE_TABLET | Freq: Every day | ORAL | Status: DC
  Administered 2022-06-24 – 2022-07-29 (×36): 40 mg via ORAL
  Filled 2022-06-24 (×36): qty 1

## 2022-06-24 MED ORDER — ASPIRIN 81 MG PO TBEC: 81.0000 mg | DELAYED_RELEASE_TABLET | Freq: Every day | ORAL | 12 refills | Status: DC

## 2022-06-24 MED ORDER — ENOXAPARIN SODIUM 40 MG/0.4ML IJ SOSY
40.0000 mg | PREFILLED_SYRINGE | INTRAMUSCULAR | Status: DC
  Administered 2022-06-25 – 2022-07-29 (×35): 40 mg via SUBCUTANEOUS
  Filled 2022-06-24 (×36): qty 0.4

## 2022-06-24 MED ORDER — COLCHICINE 0.6 MG PO TABS: 0.6000 mg | ORAL_TABLET | Freq: Two times a day (BID) | ORAL | Status: DC | PRN

## 2022-06-24 MED ORDER — ONDANSETRON HCL 4 MG/2ML IJ SOLN: 4.0000 mg | Freq: Four times a day (QID) | INTRAMUSCULAR | Status: DC | PRN

## 2022-06-24 MED ORDER — ACETAMINOPHEN 325 MG PO TABS
325.0000 mg | ORAL_TABLET | ORAL | Status: DC | PRN
  Administered 2022-06-25 – 2022-07-27 (×8): 650 mg via ORAL
  Filled 2022-06-24 (×8): qty 2

## 2022-06-24 MED ORDER — ASPIRIN 81 MG PO TBEC
81.0000 mg | DELAYED_RELEASE_TABLET | Freq: Every day | ORAL | Status: DC
  Administered 2022-06-25 – 2022-07-30 (×36): 81 mg via ORAL
  Filled 2022-06-24 (×36): qty 1

## 2022-06-24 MED ORDER — POLYETHYLENE GLYCOL 3350 17 G PO PACK
17.0000 g | PACK | Freq: Every day | ORAL | Status: DC
  Administered 2022-06-24 – 2022-06-28 (×5): 17 g via ORAL
  Filled 2022-06-24 (×5): qty 1

## 2022-06-24 MED ORDER — SORBITOL 70 % SOLN
30.0000 mL | Freq: Every day | Status: DC | PRN
  Administered 2022-07-08: 30 mL via ORAL
  Filled 2022-06-24 (×3): qty 30

## 2022-06-24 MED ORDER — CLOPIDOGREL BISULFATE 75 MG PO TABS: 75.0000 mg | ORAL_TABLET | Freq: Every day | ORAL | 3 refills | Status: DC

## 2022-06-24 MED ORDER — AMLODIPINE BESYLATE 10 MG PO TABS
10.0000 mg | ORAL_TABLET | Freq: Every day | ORAL | Status: DC
  Administered 2022-06-25 – 2022-07-30 (×36): 10 mg via ORAL
  Filled 2022-06-24 (×36): qty 1

## 2022-06-24 MED ORDER — INSULIN ASPART 100 UNIT/ML IJ SOLN
0.0000 [IU] | Freq: Three times a day (TID) | INTRAMUSCULAR | Status: DC
  Administered 2022-06-24 – 2022-06-25 (×3): 3 [IU] via SUBCUTANEOUS
  Administered 2022-06-25: 2 [IU] via SUBCUTANEOUS
  Administered 2022-06-26: 3 [IU] via SUBCUTANEOUS
  Administered 2022-06-26: 5 [IU] via SUBCUTANEOUS
  Administered 2022-06-26 – 2022-06-29 (×8): 3 [IU] via SUBCUTANEOUS
  Administered 2022-06-29 (×2): 2 [IU] via SUBCUTANEOUS
  Administered 2022-06-30 (×2): 3 [IU] via SUBCUTANEOUS
  Administered 2022-06-30 – 2022-07-01 (×3): 2 [IU] via SUBCUTANEOUS
  Administered 2022-07-01: 3 [IU] via SUBCUTANEOUS
  Administered 2022-07-02 (×3): 2 [IU] via SUBCUTANEOUS
  Administered 2022-07-03: 3 [IU] via SUBCUTANEOUS
  Administered 2022-07-03 – 2022-07-04 (×3): 2 [IU] via SUBCUTANEOUS
  Administered 2022-07-04: 3 [IU] via SUBCUTANEOUS
  Administered 2022-07-04: 2 [IU] via SUBCUTANEOUS
  Administered 2022-07-05: 3 [IU] via SUBCUTANEOUS
  Administered 2022-07-05 (×2): 2 [IU] via SUBCUTANEOUS
  Administered 2022-07-06: 3 [IU] via SUBCUTANEOUS
  Administered 2022-07-06: 2 [IU] via SUBCUTANEOUS
  Administered 2022-07-06: 3 [IU] via SUBCUTANEOUS
  Administered 2022-07-07 (×2): 2 [IU] via SUBCUTANEOUS
  Administered 2022-07-07: 3 [IU] via SUBCUTANEOUS
  Administered 2022-07-08: 2 [IU] via SUBCUTANEOUS
  Administered 2022-07-08: 3 [IU] via SUBCUTANEOUS
  Administered 2022-07-09 (×3): 2 [IU] via SUBCUTANEOUS

## 2022-06-24 MED ORDER — IRBESARTAN 75 MG PO TABS
37.5000 mg | ORAL_TABLET | Freq: Every day | ORAL | Status: DC
  Administered 2022-06-25 – 2022-07-02 (×8): 37.5 mg via ORAL
  Filled 2022-06-24 (×8): qty 0.5

## 2022-06-24 MED ORDER — ALUM & MAG HYDROXIDE-SIMETH 200-200-20 MG/5ML PO SUSP: 30.0000 mL | ORAL | Status: DC | PRN

## 2022-06-24 MED ORDER — CLOPIDOGREL BISULFATE 75 MG PO TABS
75.0000 mg | ORAL_TABLET | Freq: Every day | ORAL | Status: DC
  Administered 2022-06-25 – 2022-07-30 (×36): 75 mg via ORAL
  Filled 2022-06-24 (×36): qty 1

## 2022-06-24 NOTE — Progress Notes (Signed)
Physical Medicine and Rehabilitation Consult Reason for Consult:right sided weakness and functional deficits Referring Physician: Pearlean Brownie     HPI: Jimmy Valentine is a 72 y.o. male with a history of gout, HTN, OSA, DM2, and hearing loss who presented on 06/21/22 with acute right sided weakness, right facial droop, and associated dysarthria. Pt received TNK. CTA notable for adherent thrombus in the left MCA M1 segment causing a high grade stenosis as well as severe intracranial and cervical ICA atherosclerosis. MRI positive for large MCA infarct involving the left basal ganglia. Pt placed on ASA and plavix for 3 mos followed by ASA alone. Pt was up with therapy yesterday and was max assist for squat-pivot transfers. Gait was not tested. Pt lives alone and was independent prior to admission. He has brother-in-law who apparently can provide assistance at discharge.      Review of Systems  Constitutional:  Negative for fever.  HENT:  Positive for hearing loss.   Eyes:  Negative for blurred vision and double vision.  Respiratory:  Positive for cough.   Cardiovascular:  Negative for chest pain.  Gastrointestinal:  Negative for nausea and vomiting.  Genitourinary:  Negative for dysuria.  Musculoskeletal:  Negative for myalgias.  Skin:  Negative for rash.  Neurological:  Positive for dizziness, sensory change and focal weakness.  Psychiatric/Behavioral:         Having some difficulties coping with stroke        Past Medical History:  Diagnosis Date   Arthritis     DM2 (diabetes mellitus, type 2) (HCC)     Elevated LFTs     Erectile dysfunction     Gout 09/03/2011    RF colchicine     Hyperlipidemia 1990s   Hypertension 1980s   Hypogonadism male      Secondary hypogonadism,had a MRI, was prescribed testosterone by endocrinology   Sleep apnea      does't use cpap   Sudden hearing loss, left 2017    idiopathic sensorineural hearing loss         Past Surgical History:   Procedure Laterality Date   TOTAL KNEE ARTHROPLASTY Right 2005   TOTAL KNEE ARTHROPLASTY   12/04/2011    LEFT-- TOTAL KNEE ARTHROPLASTY;  Surgeon: Eugenia Mcalpine, MD;  Location: [facility];  Service: Orthopedics;  Laterality: Left;         Family History  Problem Relation Age of Onset   Hypertension Mother     Coronary artery disease Mother          M CABG at age 78s   Alzheimer's disease Mother     Diabetes Sister     Colon cancer Neg Hx     Prostate cancer Neg Hx     Stomach cancer Neg Hx     Rectal cancer Neg Hx      Social History:  reports that he quit smoking about 26 years ago. His smoking use included cigarettes. He has a 15.00 pack-year smoking history. He has never used smokeless tobacco. He reports current alcohol use of about 12.0 standard drinks of alcohol per week. He reports that he does not use drugs. Allergies: No Known Allergies       Medications Prior to Admission  Medication Sig Dispense Refill   amLODipine (NORVASC) 10 MG tablet TAKE 1 TABLET BY MOUTH EVERY DAY 90 tablet 1   metFORMIN (GLUCOPHAGE) 1000 MG tablet Take 1 tablet (1,000 mg total) by mouth 2 (  two) times daily with a meal. 180 tablet 1   simvastatin (ZOCOR) 40 MG tablet TAKE 0.5 TABLETS (20 MG TOTAL) BY MOUTH AT BEDTIME. 45 tablet 1   valsartan (DIOVAN) 320 MG tablet Take 1 tablet (320 mg total) by mouth daily. 90 tablet 1   colchicine 0.6 MG tablet Take 1 tablet (0.6 mg total) by mouth 2 (two) times daily as needed. (Patient not taking: Reported on 06/21/2022) 60 tablet 6   sildenafil (REVATIO) 20 MG tablet TAKE 3 TO 4 TABLETS BY MOUTH EVERY DAY AS NEEDED (Patient taking differently: Take 60-80 mg by mouth daily as needed (as directed).) 270 tablet 0      Home: Home Living Family/patient expects to be discharged to:: Private residence Living Arrangements: Alone Available Help at Discharge: Family Type of Home: House Home Access: Stairs to enter Secretary/administrator of Steps: 4 Entrance  Stairs-Rails: Left Home Layout: One level Bathroom Shower/Tub: Health visitor: Handicapped height Bathroom Accessibility: Yes Home Equipment: None Additional Comments: brother in law present and states can stay with him  Lives With: Alone  Functional History: Prior Function Prior Level of Function : Independent/Modified Independent, Driving, History of Falls (last six months) ADLs Comments: not working, but driving Functional Status:  Mobility: Bed Mobility Overal bed mobility: Needs Assistance Bed Mobility: Supine to Sit Supine to sit: HOB elevated, Mod assist, Max assist, +2 for physical assistance General bed mobility comments: using rail on L and able to sit up with mod A assist for R leg off bed and scooting hips, HOB up about 50 degrees Transfers Overall transfer level: Needs assistance Equipment used: 2 person hand held assist Transfers: Bed to chair/wheelchair/BSC Bed to/from chair/wheelchair/BSC transfer type:: Squat pivot Squat pivot transfers: Max assist, +2 physical assistance General transfer comment: lifting help and assist to pivot hips with bed pad under him pt reaching to chair and impulsively trying to move unaided initially, cues for safety   ADL: ADL Overall ADL's : Needs assistance/impaired Grooming: Minimal assistance, Sitting Upper Body Dressing : Maximal assistance, Sitting Lower Body Dressing: Total assistance, +2 for physical assistance, +2 for safety/equipment, Sit to/from stand, Sitting/lateral leans Toilet Transfer: Maximal assistance, +2 for physical assistance, +2 for safety/equipment, Stand-pivot Functional mobility during ADLs: Maximal assistance, +2 for physical assistance, +2 for safety/equipment   Cognition: Cognition Overall Cognitive Status: Impaired/Different from baseline Arousal/Alertness: Awake/alert Orientation Level: Oriented X4 Year: 2024 Month: May Day of Week: Correct Attention: Sustained Sustained Attention:  Impaired Sustained Attention Impairment: Verbal complex Awareness: Impaired Awareness Impairment: Emergent impairment Safety/Judgment: Impaired Cognition Arousal/Alertness: Awake/alert Behavior During Therapy: Impulsive Overall Cognitive Status: Impaired/Different from baseline Area of Impairment: Attention, Safety/judgement, Problem solving, Awareness Current Attention Level: Sustained Safety/Judgement: Decreased awareness of deficits, Decreased awareness of safety Awareness: Emergent Problem Solving: Slow processing, Difficulty sequencing, Requires tactile cues, Requires verbal cues General Comments: inattentive on R and impulsive, follows commands but demonstrates poor awareness to safety   Blood pressure (!) 153/80, pulse 64, temperature 98.6 F (37 C), temperature source Oral, resp. rate 18, height 5\' 9"  (1.753 m), weight 99.8 kg, SpO2 93 %. Physical Exam Constitutional:      Appearance: He is obese.  HENT:     Head: Normocephalic.     Right Ear: External ear normal.     Left Ear: External ear normal.     Nose: Nose normal.  Eyes:     Conjunctiva/sclera: Conjunctivae normal.  Cardiovascular:     Rate and Rhythm: Normal rate and regular rhythm.  Pulmonary:  Effort: Pulmonary effort is normal.     Comments: Occasional wet cough Abdominal:     Palpations: Abdomen is soft.  Musculoskeletal:        General: No swelling.     Cervical back: Normal range of motion.     Right lower leg: No edema.     Left lower leg: No edema.     Comments: Two prior TKA's. Missing tip of right middle finger  Skin:    General: Skin is warm and dry.  Neurological:     Mental Status: He is alert.     Comments: Alert and oriented x 3. Functional insight and awareness. Functional Memory. Normal language. Speech slurred. Wet vocal quality. PERRL, EOMI. Right central VII and XII. MMT: RUE 0/5 prox to distal. RLE trace HE, KF, KE and 0/5 distally. LUE and LLE grossly 5/5. Slight decrease in LT RUE  and to lesser extent RLE. Right arm is slightly sensitive to touch. DTR's difficult to elicit in RUE. RLE DTR's are 3+. Early flexor tone right wrist, fingers and extensor tone RLE.  Psychiatric:     Comments: Teared up somewhat when we discussed recovery from stroke/rehab stay. Otherwise very appropriate and pleasant.         Lab Results Last 24 Hours       Results for orders placed or performed during the hospital encounter of 06/21/22 (from the past 24 hour(s))  Glucose, capillary     Status: Abnormal    Collection Time: 06/22/22 12:04 PM  Result Value Ref Range    Glucose-Capillary 212 (H) 70 - 99 mg/dL  Glucose, capillary     Status: Abnormal    Collection Time: 06/22/22  5:35 PM  Result Value Ref Range    Glucose-Capillary 198 (H) 70 - 99 mg/dL  Glucose, capillary     Status: Abnormal    Collection Time: 06/22/22 10:10 PM  Result Value Ref Range    Glucose-Capillary 141 (H) 70 - 99 mg/dL  Glucose, capillary     Status: Abnormal    Collection Time: 06/23/22  6:20 AM  Result Value Ref Range    Glucose-Capillary 165 (H) 70 - 99 mg/dL    Comment 1 Notify RN      Comment 2 Document in Chart         Imaging Results (Last 48 hours)  DG Swallowing Func-Speech Pathology   Result Date: 06/22/2022 Table formatting from the original result was not included. Modified Barium Swallow Study Patient Details Name: Jimmy Valentine MRN: [id number] Date of Birth: [birth year] Today's Date: 06/22/2022 HPI/PMH: HPI: Patient is a 73 y.o. male with PMH: arthritis, ED, gout, HLD, HTN, sleep apnea, DM, idiopathic sensorineural hearing loss (left). He presented to the ED on 06/21/22 via EMS from home for code stroke after acute onset of right sided weakness. En route to ED, patient spontaneously recovered completely with all symptoms but while still en route, his symptoms recurred. CT head was negative for acute intracranial hemorrhage. He received TNK. MRI brain ordered but not completed at time of SLP  evaluation. Neurology reporting that patient's overall presentation is consistent with aute ischemic stroke within the left MCA territory. Clinical Impression: Clinical Impression: Pt presented with a primary oral dysphagia marked by decreased oral control and sensation, with large portions of barium escaping anteriorly from right side of oral cavity, intermittently without awareness.  Otherwise, pt demonstrated functional pharyngeal squeeze, trace residue in valleculae post-swallow, generally reliable laryngeal vestibule closure. There was a single incident  of very trace aspiration of thin liquids when pt was following solids immediately with liquids.  A pill transferred readily through the esophagus.  It was difficult to view the proximal esophagus/PES due to obstruction by shoulder, but it is presumed flow from pharynx to esophagus was functional.  Recommend continuing a dysphagia 3 diet with nectar thick liquids for now. SLP will f/u to teach swallowing strategies to manage oral control and bolus release to pharynx. Anticipate progression to thin liquids in the next few days - pt will not need a repeat MBS> Factors that may increase risk of adverse event in presence of aspiration Rubye Oaks & Clearance Coots 2021): No data recorded Recommendations/Plan: Swallowing Evaluation Recommendations Swallowing Evaluation Recommendations Recommendations: PO diet PO Diet Recommendation: Dysphagia 3 (Mechanical soft); Mildly thick liquids (Level 2, nectar thick) Liquid Administration via: Cup; Straw Medication Administration: Whole meds with liquid (whole pills with nectar) Supervision: Full assist for feeding Postural changes: Position pt fully upright for meals Oral care recommendations: Oral care BID (2x/day) Treatment Plan Treatment Plan Treatment recommendations: Therapy as outlined in treatment plan below Functional status assessment: Patient has had a recent decline in their functional status and demonstrates the ability to  make significant improvements in function in a reasonable and predictable amount of time. Treatment frequency: Min 2x/week Treatment duration: 2 weeks Recommendations Recommendations for follow up therapy are one component of a multi-disciplinary discharge planning process, led by the attending physician.  Recommendations may be updated based on patient status, additional functional criteria and insurance authorization. Assessment: Orofacial Exam: Orofacial Exam Oral Cavity - Dentition: Adequate natural dentition Orofacial Anatomy: WFL Oral Motor/Sensory Function: Suspected cranial nerve impairment CN V - Trigeminal: Not tested CN VII - Facial: Right motor impairment CN XII - Hypoglossal: WFL Anatomy: Anatomy: WFL Boluses Administered: Boluses Administered Boluses Administered: Thin liquids (Level 0); Mildly thick liquids (Level 2, nectar thick); Moderately thick liquids (Level 3, honey thick); Puree; Solid  Oral Impairment Domain: Oral Impairment Domain Lip Closure: Escape beyond mid-chin Tongue control during bolus hold: Escape to lateral buccal cavity/floor of mouth Bolus preparation/mastication: Slow prolonged chewing/mashing with complete recollection Bolus transport/lingual motion: Brisk tongue motion Oral residue: Trace residue lining oral structures Location of oral residue : Tongue Initiation of pharyngeal swallow : Pyriform sinuses  Pharyngeal Impairment Domain: Pharyngeal Impairment Domain Soft palate elevation: No bolus between soft palate (SP)/pharyngeal wall (PW) Laryngeal elevation: Complete superior movement of thyroid cartilage with complete approximation of arytenoids to epiglottic petiole Anterior hyoid excursion: Complete anterior movement Epiglottic movement: Complete inversion Laryngeal vestibule closure: Incomplete, narrow column air/contrast in laryngeal vestibule Pharyngeal stripping wave : Present - diminished Pharyngeal contraction (A/P view only): N/A Pharyngoesophageal segment opening:  -- (difficult to visualize due to obstruction by shoulder) Tongue base retraction: Trace column of contrast or air between tongue base and PPW Pharyngeal residue: Trace residue within or on pharyngeal structures Location of pharyngeal residue: Valleculae  Esophageal Impairment Domain: Esophageal Impairment Domain Esophageal clearance upright position: Complete clearance, esophageal coating Pill: Esophageal Impairment Domain Esophageal clearance upright position: Complete clearance, esophageal coating Penetration/Aspiration Scale Score: Penetration/Aspiration Scale Score 1.  Material does not enter airway: Mildly thick liquids (Level 2, nectar thick); Moderately thick liquids (Level 3, honey thick); Puree; Solid; Pill 7.  Material enters airway, passes BELOW cords and not ejected out despite cough attempt by patient: Thin liquids (Level 0) Compensatory Strategies: Compensatory Strategies Compensatory strategies: Yes Straw: Effective Effective Straw: Thin liquid (Level 0); Mildly thick liquid (Level 2, nectar thick)   General Information:  Caregiver present: No  Diet Prior to this Study: Dysphagia 3 (mechanical soft); Mildly thick liquids (Level 2, nectar thick)   Temperature : Normal   Respiratory Status: WFL   Supplemental O2: None (Room air)   History of Recent Intubation: No  Behavior/Cognition: Alert; Cooperative Self-Feeding Abilities: Able to self-feed Baseline vocal quality/speech: Normal Volitional Cough: Unable to elicit Volitional Swallow: Able to elicit Exam Limitations: Limited visibility Goal Planning: Prognosis for improved oropharyngeal function: Good No data recorded No data recorded Patient/Family Stated Goal: patient is thirsty Consulted and agree with results and recommendations: Patient Pain: Pain Assessment Pain Assessment: No/denies pain Faces Pain Scale: 4 Pain Location: L sciatic pain in sitting on EOB Pain Descriptors / Indicators: Discomfort; Grimacing Pain Intervention(s): Limited activity  within patient's tolerance; Monitored during session; Repositioned End of Session: Start Time:SLP Start Time (ACUTE ONLY): 1306 Stop Time: SLP Stop Time (ACUTE ONLY): 1329 Time Calculation:SLP Time Calculation (min) (ACUTE ONLY): 23 min Charges: SLP Evaluations $ SLP Speech Visit: 1 Visit SLP Evaluations $BSS Swallow: 1 Procedure $MBS Swallow: 1 Procedure $ SLP EVAL LANGUAGE/SOUND PRODUCTION: 1 Procedure SLP visit diagnosis: SLP Visit Diagnosis: Dysphagia, oropharyngeal phase (R13.12) Past Medical History: Past Medical History: Diagnosis Date  Arthritis   DM2 (diabetes mellitus, type 2) (HCC)   Elevated LFTs   Erectile dysfunction   Gout 09/03/2011  RF colchicine    Hyperlipidemia 1990s  Hypertension 1980s  Hypogonadism male   Secondary hypogonadism,had a MRI, was prescribed testosterone by endocrinology  Sleep apnea   does't use cpap  Sudden hearing loss, left 2017  idiopathic sensorineural hearing loss Past Surgical History: Past Surgical History: Procedure Laterality Date  TOTAL KNEE ARTHROPLASTY Right 2005  TOTAL KNEE ARTHROPLASTY  12/04/2011  LEFT-- TOTAL KNEE ARTHROPLASTY;  Surgeon: Eugenia Mcalpine, MD;  Location: [facility];  Service: Orthopedics;  Laterality: Left; Carolan Shiver 06/22/2022, 2:53 PM Amanda L. Samson Frederic, MA CCC/SLP Clinical Specialist - Acute Care SLP Acute Rehabilitation Services Office number [phone]   ECHOCARDIOGRAM COMPLETE   Result Date: 06/22/2022    ECHOCARDIOGRAM REPORT   Patient Name:   Jimmy Valentine Date of Exam: 06/22/2022 Medical Rec #:  [id number]       Height:       69.0 in Accession #:    [id number]      Weight:       220.0 lb Date of Birth:  January 17, 1951       BSA:          2.151 m Patient Age:    72 years        BP:           156/107 mmHg Patient Gender: M               HR:           60 bpm. Exam Location:  Inpatient Procedure: 2D Echo, Cardiac Doppler, Color Doppler and Intracardiac            Opacification Agent Indications:    Stroke  History:        Patient has  prior history of Echocardiogram examinations, most                 recent 12/09/2017. Risk Factors:Sleep Apnea, Hypertension,                 Diabetes and Dyslipidemia.  Sonographer:    Milda Smart Referring Phys: [phone] ERIC LINDZEN  Sonographer Comments: Technically difficult study due to poor echo windows. Image acquisition  challenging due to patient body habitus and Image acquisition challenging due to respiratory motion. Mid-intracavitary gradient obtained . IMPRESSIONS  1. Left ventricular ejection fraction, by estimation, is 65 to 70%. The left ventricle has normal function. The left ventricle has no regional wall motion abnormalities. There is mild concentric left ventricular hypertrophy. Left ventricular diastolic parameters are indeterminate.  2. Right ventricular systolic function is normal. The right ventricular size is normal.  3. The mitral valve is grossly normal. Trivial mitral valve regurgitation. No evidence of mitral stenosis.  4. The aortic valve has an indeterminant number of cusps. There is mild calcification of the aortic valve. There is mild thickening of the aortic valve. Aortic valve regurgitation is not visualized. Aortic valve sclerosis is present, with no evidence of  aortic valve stenosis.  5. The inferior vena cava is normal in size with greater than 50% respiratory variability, suggesting right atrial pressure of 3 mmHg. Comparison(s): Prior images unable to be directly viewed, comparison made by report only. Conclusion(s)/Recommendation(s): Otherwise normal echocardiogram, with minor abnormalities described in the report. FINDINGS  Left Ventricle: Left ventricular ejection fraction, by estimation, is 65 to 70%. The left ventricle has normal function. The left ventricle has no regional wall motion abnormalities. Definity contrast agent was given IV to delineate the left ventricular  endocardial borders. The left ventricular internal cavity size was normal in size. There is mild  concentric left ventricular hypertrophy. Left ventricular diastolic parameters are indeterminate. Right Ventricle: The right ventricular size is normal. Right vetricular wall thickness was not well visualized. Right ventricular systolic function is normal. Left Atrium: Left atrial size was normal in size. Right Atrium: Right atrial size was normal in size. Pericardium: There is no evidence of pericardial effusion. Mitral Valve: The mitral valve is grossly normal. Trivial mitral valve regurgitation. No evidence of mitral valve stenosis. MV peak gradient, 3.2 mmHg. The mean mitral valve gradient is 2.0 mmHg. Tricuspid Valve: The tricuspid valve is grossly normal. Tricuspid valve regurgitation is trivial. No evidence of tricuspid stenosis. Aortic Valve: The aortic valve has an indeterminant number of cusps. There is mild calcification of the aortic valve. There is mild thickening of the aortic valve. Aortic valve regurgitation is not visualized. Aortic valve sclerosis is present, with no evidence of aortic valve stenosis. Pulmonic Valve: The pulmonic valve was not well visualized. Pulmonic valve regurgitation is trivial. No evidence of pulmonic stenosis. Aorta: The aortic root, ascending aorta, aortic arch and descending aorta are all structurally normal, with no evidence of dilitation or obstruction. Venous: The inferior vena cava is normal in size with greater than 50% respiratory variability, suggesting right atrial pressure of 3 mmHg. IAS/Shunts: The atrial septum is grossly normal.  LEFT VENTRICLE PLAX 2D LVIDd:         4.90 cm     Diastology LVIDs:         2.70 cm     LV e' medial:    4.95 cm/s LV PW:         1.20 cm     LV E/e' medial:  15.8 LV IVS:        1.30 cm     LV e' lateral:   6.61 cm/s LVOT diam:     2.00 cm     LV E/e' lateral: 11.8 LV SV:         52 LV SV Index:   24 LVOT Area:     3.14 cm  LV Volumes (MOD) LV vol d, MOD A2C: 49.5  ml LV vol d, MOD A4C: 44.7 ml LV vol s, MOD A2C: 17.0 ml LV vol s, MOD  A4C: 11.9 ml LV SV MOD A2C:     32.5 ml LV SV MOD A4C:     44.7 ml LV SV MOD BP:      32.6 ml RIGHT VENTRICLE RV S prime:     9.89 cm/s TAPSE (M-mode): 2.2 cm LEFT ATRIUM             Index        RIGHT ATRIUM           Index LA diam:        2.70 cm 1.25 cm/m   RA Area:     13.80 cm LA Vol (A2C):   36.5 ml 16.97 ml/m  RA Volume:   33.00 ml  15.34 ml/m LA Vol (A4C):   28.9 ml 13.43 ml/m LA Biplane Vol: 34.3 ml 15.94 ml/m  AORTIC VALVE LVOT Vmax:   104.30 cm/s LVOT Vmean:  74.950 cm/s LVOT VTI:    0.166 m  AORTA Ao Root diam: 3.30 cm Ao Asc diam:  3.20 cm MITRAL VALVE MV Area (PHT): 2.86 cm    SHUNTS MV Area VTI:   1.83 cm    Systemic VTI:  0.17 m MV Peak grad:  3.2 mmHg    Systemic Diam: 2.00 cm MV Mean grad:  2.0 mmHg MV Vmax:       0.90 m/s MV Vmean:      60.9 cm/s MV Decel Time: 266 msec MV E velocity: 78.10 cm/s MV A velocity: 72.50 cm/s MV E/A ratio:  1.08 Jodelle Red MD Electronically signed by Jodelle Red MD Signature Date/Time: 06/22/2022/11:32:04 AM    Final     MR BRAIN WO CONTRAST   Result Date: 06/22/2022 CLINICAL DATA:  72 year old male code stroke presentation yesterday with right side weakness, adherent thrombus in the left MCA M1. status post T NK. EXAM: MRI HEAD WITHOUT CONTRAST TECHNIQUE: Multiplanar, multiecho pulse sequences of the brain and surrounding structures were obtained without intravenous contrast. COMPARISON:  CT head, CTA head and neck yesterday. Previous brain MRI 05/02/2008. FINDINGS: Brain: Confluent, curvilinear area of restricted diffusion tracking about 3 cm from the left caudate/corona radiata toward the posterior left lentiform and posterior limb left internal capsule. T2 and FLAIR hyperintensity. Evidence of petechial hemorrhage on SWI in the lentiform Heidelberg classification 1b: HI2, confluent petechiae, no mass effect. No mass effect. No other diffusion restriction. No midline shift, mass effect, evidence of mass lesion, ventriculomegaly,  extra-axial collection or acute intracranial hemorrhage. Cervicomedullary junction and pituitary are within normal limits. Volume loss since 2010 appears to be generalized. Outside of the acute finding gray and white matter signal remains fairly normal for age. Minimal other nonspecific white matter T2 and FLAIR hyperintensity. No other chronic cerebral blood products. Brainstem and cerebellum remain normal for age. Vascular: Loss of the distal right vertebral artery flow void in keeping with right vertebral artery occlusion demonstrated by CTA yesterday. This is new compared to 2010. Other Major intracranial vascular flow voids are preserved. Skull and upper cervical spine: Negative for age visible cervical spine. Visualized bone marrow signal is within normal limits. 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 Orbits appear negative. Other: Trace chronic left mastoid air cell effusion is stable. Trace retained secretions in the nasopharynx now. Other visible internal auditory structures appear grossly normal. Negative visible scalp and face. IMPRESSION: 1. Positive for Left MCA territory lacunar type  infarct tracking from the left corona radiata to the lentiform. Petechial hemorrhage (Heidelberg classification 1b: HI2). No malignant hemorrhagic transformation. No significant mass effect. 2. No other acute intracranial abnormality. 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 Electronically Signed   By: Odessa Fleming M.D.   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    Does the need for close, 24 hr/day medical supervision in concert with the patient's rehab needs make it unreasonable for this patient to be served in a less intensive setting?  Yes Co-Morbidities requiring supervision/potential complications:  -dysphagia, nutrition -HTN -Obstructive sleep apnea -Type II diabetes Due to bladder management, bowel management, safety, skin/wound care, disease management, medication administration, pain management, and patient education, does the patient require 24 hr/day rehab nursing? Yes Does the patient require coordinated care of a physician, rehab nurse, therapy disciplines of PT, OT, SLP to address physical and functional deficits in the context of the above medical diagnosis(es)? Yes Addressing deficits in the following areas: balance, endurance, locomotion, strength, transferring, bowel/bladder control, bathing, dressing, feeding, grooming, toileting, cognition, speech, swallowing, and psychosocial support Can the patient actively participate in an intensive therapy program of at least 3 hrs of therapy per day at least 5 days per week? Yes The potential for patient to make measurable gains while on inpatient rehab is excellent Anticipated functional outcomes upon discharge from inpatient rehab are supervision and min assist  with PT, supervision and min assist with OT, modified independent and supervision with SLP. Estimated rehab length of stay to reach the above functional goals is: 20-25 days Anticipated discharge destination: Home Overall Rehab/Functional Prognosis: excellent   POST ACUTE RECOMMENDATIONS: This patient's condition is appropriate for continued rehabilitative care in the following setting: CIR Patient has agreed to participate in recommended program. Yes Note that insurance prior authorization may be required for reimbursement for recommended care.   Comment: Rehab Admissions Coordinator to follow up      MEDICAL RECOMMENDATIONS: Pt is developing early hypertonicity in the right upper and lower extremities -will order WHO and PRAFO to be worn while in bed.      I have personally performed a face to face  diagnostic evaluation of this patient. Additionally, I have examined the patient's medical record including any pertinent labs and radiographic images. If the physician assistant has documented in this note, I have reviewed and edited or otherwise concur with the physician assistant's documentation.   Thanks,   Ranelle Oyster, MD 06/23/2022

## 2022-06-24 NOTE — Progress Notes (Signed)
Physical Therapy Treatment Patient Details Name: Jimmy Valentine MRN: [id number] DOB: 1950/06/11 Today's Date: 06/24/2022   History of Present Illness Pt is a 72 y/o male presenting on 5/19 with acute onset R sided weakness. 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  Had TNKase on admission, undergoing work up for vascular surgery intervention.  PMH includes: arthritis, gout, HTN, sleep apnea, DM, idiopathic sensorineural hearing loss (L), B TKA.    PT Comments    Pt making steady progress towards his physical therapy goals and remains motivated to participate. Session focused on sitting/standing balance, pre transfer training, ADL's and postural/neuromuscular re-education. Utilized Stedy to perform sit to stands with bathroom mirror to facilitate visual feedback for midline alignment in addition to tactile/verbal cueing. Patient will benefit from intensive inpatient follow up therapy, >3 hours/day.    Recommendations for follow up therapy are one component of a multi-disciplinary discharge planning process, led by the attending physician.  Recommendations may be updated based on patient status, additional functional criteria and insurance authorization.  Follow Up Recommendations       Assistance Recommended at Discharge Frequent or constant Supervision/Assistance  Patient can return home with the following Two people to help with walking and/or transfers;Two people to help with bathing/dressing/bathroom;Assistance with feeding;Help with stairs or ramp for entrance;Assistance with cooking/housework;Assist for transportation   Equipment Recommendations  Other (comment) (TBA)    Recommendations for Other Services       Precautions / Restrictions Precautions Precautions: Fall Precaution Comments: R hemiparesis, inattention Restrictions Weight Bearing Restrictions: No     Mobility  Bed Mobility Overal bed mobility: Needs  Assistance Bed Mobility: Supine to Sit     Supine to sit: +2 for physical assistance, Mod assist     General bed mobility comments: Pt able to progress LLE off edge of bed with cueing, assist for RLE and trunk to sitting. Cues for use of bed rail with LUE.    Transfers Overall transfer level: Needs assistance Equipment used: Ambulation equipment used Transfers: Sit to/from Stand Sit to Stand: Mod assist, +2 physical assistance, Max assist           General transfer comment: Pt pulling up with LUE on Stedy to power up, RUE placed on bar and supported; maxA from edge of bed, modA from elevated seat flaps.    Ambulation/Gait                   Stairs             Wheelchair Mobility    Modified Rankin (Stroke Patients Only) Modified Rankin (Stroke Patients Only) Pre-Morbid Rankin Score: No symptoms Modified Rankin: Severe disability     Balance Overall balance assessment: Needs assistance Sitting-balance support: Feet supported Sitting balance-Leahy Scale: Poor Sitting balance - Comments: Right lateral lean                                    Cognition Arousal/Alertness: Awake/alert Behavior During Therapy: WFL for tasks assessed/performed Overall Cognitive Status: Impaired/Different from baseline Area of Impairment: Attention, Safety/judgement, Problem solving, Awareness                   Current Attention Level: Sustained     Safety/Judgement: Decreased awareness of deficits, Decreased awareness of safety Awareness: Emergent Problem Solving: Slow processing, Difficulty sequencing, Requires tactile cues, Requires verbal cues General Comments:  continues with R inattention, however, benefits from visual cueing. Decreased carryover        Exercises General Exercises - Lower Extremity Heel Slides: PROM, Right, 5 reps, Supine Hip ABduction/ADduction: PROM, Right, Both, Supine Other Exercises Other Exercises: Standing in  Stedy: cervical rotation to R/L x 3, glute sets x 3, sit to stands x 5, weight shifting to R/L x 10. Use of mirror to provide visual cueing for midline/posture throughout.    General Comments        Pertinent Vitals/Pain Pain Assessment Pain Assessment: Faces Faces Pain Scale: No hurt    Home Living                          Prior Function            PT Goals (current goals can now be found in the care plan section) Acute Rehab PT Goals Patient Stated Goal: to return to independent Potential to Achieve Goals: Good Progress towards PT goals: Progressing toward goals    Frequency    Min 4X/week      PT Plan Current plan remains appropriate    Co-evaluation PT/OT/SLP Co-Evaluation/Treatment: Yes Reason for Co-Treatment: Complexity of the patient's impairments (multi-system involvement);For patient/therapist safety;To address functional/ADL transfers PT goals addressed during session: Mobility/safety with mobility;Balance        AM-PAC PT "6 Clicks" Mobility   Outcome Measure  Help needed turning from your back to your side while in a flat bed without using bedrails?: A Lot Help needed moving from lying on your back to sitting on the side of a flat bed without using bedrails?: Total Help needed moving to and from a bed to a chair (including a wheelchair)?: Total Help needed standing up from a chair using your arms (e.g., wheelchair or bedside chair)?: Total Help needed to walk in hospital room?: Total Help needed climbing 3-5 steps with a railing? : Total 6 Click Score: 7    End of Session Equipment Utilized During Treatment: Gait belt Activity Tolerance: Patient tolerated treatment well Patient left: with call bell/phone within reach;in chair;with chair alarm set Nurse Communication: Mobility status;Need for lift equipment PT Visit Diagnosis: Other abnormalities of gait and mobility (R26.89);Muscle weakness (generalized) (M62.81);Hemiplegia and  hemiparesis;Other symptoms and signs involving the nervous system (R29.898) Hemiplegia - Right/Left: Right Hemiplegia - dominant/non-dominant: Dominant Hemiplegia - caused by: Cerebral infarction     Time: 0925-0959 PT Time Calculation (min) (ACUTE ONLY): 34 min  Charges:  $Therapeutic Activity: 8-22 mins                     Lillia Pauls, PT, DPT Acute Rehabilitation Services Office [phone]    Norval Morton 06/24/2022, 1:42 PM

## 2022-06-24 NOTE — Progress Notes (Signed)
Arrived to the unit at 1700. Oriented to unit and to assigned room. Admission/ Assessments complete.    Tilden Dome, LPN

## 2022-06-24 NOTE — Progress Notes (Signed)
Occupational Therapy Treatment Patient Details Name: Jimmy Valentine MRN: [id number] DOB: January 25, 1951 Today's Date: 06/24/2022   History of present illness Pt is a 72 y/o male presenting on 5/19 with acute onset R sided weakness. 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  Had TNKase on admission, undergoing work up for vascular surgery intervention.  PMH includes: arthritis, gout, HTN, sleep apnea, DM, idiopathic sensorineural hearing loss (L), B TKA.   OT comments  Patient demonstrating good progress and continues to be motivated to participate with therapy. Patient demonstrating gains with bed mobility, and correcting midline position with verbal and tactile cues. Patient instructed on one handed technique for grooming tasks seated on Stedy pads at sink. Patient able to stand in [place] with mod assist +2 and able to assist with correcting midline with use of mirror. Patient will benefit from intensive inpatient follow up therapy, >3 hours/day.    Recommendations for follow up therapy are one component of a multi-disciplinary discharge planning process, led by the attending physician.  Recommendations may be updated based on patient status, additional functional criteria and insurance authorization.    Assistance Recommended at Discharge Frequent or constant Supervision/Assistance  Patient can return home with the following  Two people to help with walking and/or transfers;Two people to help with bathing/dressing/bathroom;Assistance with cooking/housework;Direct supervision/assist for medications management;Direct supervision/assist for financial management;Assist for transportation;Help with stairs or ramp for entrance   Equipment Recommendations  Other (comment) (defer)    Recommendations for Other Services Rehab consult    Precautions / Restrictions Precautions Precautions: Fall Precaution Comments: R hemiparesis,  inattention Restrictions Weight Bearing Restrictions: No       Mobility Bed Mobility Overal bed mobility: Needs Assistance Bed Mobility: Supine to Sit Rolling: Max assist   Supine to sit: +2 for physical assistance, Mod assist     General bed mobility comments: Pt able to progress LLE off edge of bed with cueing, assist for RLE and trunk to sitting. Cues for use of bed rail with LUE.    Transfers Overall transfer level: Needs assistance Equipment used: Ambulation equipment used Transfers: Sit to/from Stand Sit to Stand: Mod assist, +2 physical assistance, Max assist           General transfer comment: Patient able to use LUE to pull up into [place] with RUE placed on bar.     Balance Overall balance assessment: Needs assistance Sitting-balance support: Feet supported Sitting balance-Leahy Scale: Poor Sitting balance - Comments: Right lateral lean       Standing balance comment: stood into stedy for transfer and at sink with cues for proper alignment with use of mirror                           ADL either performed or assessed with clinical judgement   ADL Overall ADL's : Needs assistance/impaired     Grooming: Wash/dry hands;Wash/dry face;Oral care;Minimal assistance;Sitting;Standing Grooming Details (indicate cue type and reason): able to brush teeth seated on Stedy pads, stood to spit in sink                                    Agricultural consultant     Praxis  Cognition Arousal/Alertness: Awake/alert Behavior During Therapy: WFL for tasks assessed/performed Overall Cognitive Status: Impaired/Different from baseline Area of Impairment: Attention, Safety/judgement, Problem solving, Awareness                   Current Attention Level: Sustained     Safety/Judgement: Decreased awareness of deficits, Decreased awareness of safety Awareness: Emergent Problem Solving:  Slow processing, Difficulty sequencing, Requires tactile cues, Requires verbal cues General Comments: continues with R inattention, however, benefits from visual cueing. Decreased carryover        Exercises      Shoulder Instructions       General Comments RUE RHS donned and checked for pressure areas following 1 hour of wear    Pertinent Vitals/ Pain       Pain Assessment Pain Assessment: Faces Faces Pain Scale: No hurt Pain Intervention(s): Monitored during session  Home Living                                          Prior Functioning/Environment              Frequency  Min 2X/week        Progress Toward Goals  OT Goals(current goals can now be found in the care plan section)  Progress towards OT goals: Progressing toward goals  Acute Rehab OT Goals Patient Stated Goal: go to AIR OT Goal Formulation: With patient Time For Goal Achievement: 07/06/22 Potential to Achieve Goals: Good ADL Goals Pt Will Perform Grooming: with set-up;sitting Pt Will Perform Upper Body Dressing: with supervision;sitting Pt Will Transfer to Toilet: bedside commode;stand pivot transfer;with mod assist Pt/caregiver will Perform Home Exercise Program: Right Upper extremity;With written HEP provided;Increased ROM;Increased strength Additional ADL Goal #1: Pt will complete bed mobility with min assist as precursor to ADLs.  Plan Discharge plan remains appropriate    Co-evaluation    PT/OT/SLP Co-Evaluation/Treatment: Yes Reason for Co-Treatment: Complexity of the patient's impairments (multi-system involvement);For patient/therapist safety;To address functional/ADL transfers PT goals addressed during session: Mobility/safety with mobility;Balance OT goals addressed during session: ADL's and self-care      AM-PAC OT "6 Clicks" Daily Activity     Outcome Measure   Help from another person eating meals?: A Little Help from another person taking care of personal  grooming?: A Lot Help from another person toileting, which includes using toliet, bedpan, or urinal?: Total Help from another person bathing (including washing, rinsing, drying)?: A Lot Help from another person to put on and taking off regular upper body clothing?: A Lot Help from another person to put on and taking off regular lower body clothing?: Total 6 Click Score: 11    End of Session Equipment Utilized During Treatment: Gait belt;Other (comment) Antony Salmon)  OT Visit Diagnosis: Repeated falls (R29.6);History of falling (Z91.81);Pain;Other symptoms and signs involving cognitive function;Other symptoms and signs involving the nervous system (R29.898)   Activity Tolerance Patient tolerated treatment well   Patient Left in chair;with call bell/phone within reach;with chair alarm set;Other (comment) (RUE RHS donned)   Nurse Communication Mobility status;Need for lift equipment        Time: 0926-1000 OT Time Calculation (min): 34 min  Charges: OT General Charges $OT Visit: 1 Visit OT Treatments $Self Care/Home Management : 8-22 mins  Alfonse Flavors, OTA Acute Rehabilitation Services  Office [phone]   Dewain Penning 06/24/2022, 2:12 PM

## 2022-06-24 NOTE — Progress Notes (Signed)
PMR Admission Coordinator Pre-Admission Assessment   Patient: Jimmy Valentine is an 72 y.o., male MRN: [id number] DOB: 1950-06-02 Height: 5\' 9"  (175.3 cm) Weight: 99.8 kg                                                                                                                                                  Insurance Information HMO:     PPO: yes     PCP:      IPA:      80/20:      OTHER:  PRIMARY: [employer]      Policy#: [id number]      Subscriber:  CM Name: Wallace Cullens      Phone#: [phone]     Fax#: [phone] Pre-Cert#: [id number] auth for CIR from [facility] with [employer] for admit 5/22 with updates on 5/27 to [facility] at fax listed above   Employer:  Benefits:  Phone #: [phone]     Name:  Eff. Date: 02/02/22     Deduct: $0      Out of Pocket Max: $4500 (met $0)      Life Max: n/a  CIR: $295/day for days 1-6      SNF: 20 full days Outpatient:      Co-Pay: $20/visit Home Health: 100%      Co-Pay:  DME: 80%     Co-Pay: 20% Providers:  SECONDARY:       Policy#:       Phone#:    Artist:       Phone#:    The Engineer, materials Information Summary" for patients in Inpatient Rehabilitation Facilities with attached "Privacy Act Statement-Health Care Records" was provided and verbally reviewed with: Patient and Family   Emergency Contact Information Contact Information       Name Relation Home Work Mobile    Dozier,Stacy Daughter [phone]        Magda Paganini     [phone]         Current Medical History  Patient Admitting Diagnosis: CVA    History of Present Illness: Pt is a 72 y/o male with PMH of gout, HTN, OSA, DM, and hearing loss who presented to [facility] on 5/19 with c/o sudden onset right sided weakness, facial droop, and dysarthria.  NIHSS 13.  CT head showed questionable hyperdense left MCA m1 and a chronic lacunar infarct of the left corona radiata/basal ganglia.  Pt was given TNK.  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  Carotid duplex showed bilateral stenosis 1-39%. MRI showed large MCA territory basal ganglia infarct with petechial hemorrhage with no hemorrhagic transformation or mass effect.  Echo showed trivial MVR.  Hgb A1c 6.7.  Neurology recommendations for DAPT x3 months, then aspirin monotherapy, change statin to atorvastatin and continue at discharge.  Pt tolerating  a D3 diet/nectar liquids.  Therapy ongoing and pt was recommended for CIR.    Complete NIHSS TOTAL: 11 Glasgow Coma Scale Score: 15   Patient's medical record from [facility] has been reviewed by the rehabilitation admission coordinator and physician.   Past Medical History      Past Medical History:  Diagnosis Date   Arthritis     DM2 (diabetes mellitus, type 2) (HCC)     Elevated LFTs     Erectile dysfunction     Gout 09/03/2011    RF colchicine     Hyperlipidemia 1990s   Hypertension 1980s   Hypogonadism male      Secondary hypogonadism,had a MRI, was prescribed testosterone by endocrinology   Sleep apnea      does't use cpap   Sudden hearing loss, left 2017    idiopathic sensorineural hearing loss      Has the patient had major surgery during 100 days prior to admission? No   Family History  family history includes Alzheimer's disease in his mother; Coronary artery disease in his mother; Diabetes in his sister; Hypertension in his mother.     Current Medications    Current Facility-Administered Medications:    acetaminophen (TYLENOL) tablet 650 mg, 650 mg, Oral, Q4H PRN **OR** acetaminophen (TYLENOL) 160 MG/5ML solution 650 mg, 650 mg, Per Tube, Q4H PRN **OR** acetaminophen (TYLENOL) suppository 650 mg, 650 mg, Rectal, Q4H PRN, Caryl Pina, MD   amLODipine (NORVASC) tablet 10 mg, 10 mg, Oral, Daily, Caryl Pina, MD, 10 mg at 06/23/22 6962   aspirin EC tablet 81 mg, 81 mg, Oral, Daily, Richardo Priest, Erin C, NP, 81 mg at 06/23/22 9528   clopidogrel (PLAVIX) tablet 75 mg, 75 mg,  Oral, Daily, Richardo Priest, Erin C, NP, 75 mg at 06/23/22 4132   colchicine tablet 0.6 mg, 0.6 mg, Oral, BID PRN, Caryl Pina, MD   enoxaparin (LOVENOX) injection 40 mg, 40 mg, Subcutaneous, Q24H, Francena Hanly, RPH, 40 mg at 06/23/22 0936   insulin aspart (novoLOG) injection 0-15 Units, 0-15 Units, Subcutaneous, TID WC, Caryl Pina, MD, 3 Units at 06/24/22 0636   irbesartan (AVAPRO) tablet 37.5 mg, 37.5 mg, Oral, Daily, Caryl Pina, MD, 37.5 mg at 06/23/22 0936   pantoprazole (PROTONIX) EC tablet 40 mg, 40 mg, Oral, QHS, Francena Hanly, RPH, 40 mg at 06/23/22 2017   senna-docusate (Senokot-S) tablet 1 tablet, 1 tablet, Oral, QHS PRN, Caryl Pina, MD   simvastatin (ZOCOR) tablet 20 mg, 20 mg, Oral, QHS, Caryl Pina, MD, 20 mg at 06/23/22 2017   sodium chloride flush (NS) 0.9 % injection 3 mL, 3 mL, Intravenous, Once, Zadie Rhine, MD   Patients Current Diet:  Diet Order                  DIET DYS 3 Room service appropriate? 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  Independent   Indoor Mobility: Did the patient need assistance with walking from room to room (with or without device)? Independent   Stairs: Did the patient need assistance with internal or external stairs (with or without device)? Independent   Functional  Cognition: Did the patient need help planning regular tasks such as shopping or remembering to take medications? Independent   Patient Information Are you of Hispanic, Latino/a,or Spanish origin?: A. No, not of Hispanic, Latino/a, or Spanish origin What is your race?: A. White Do you need or want an interpreter to communicate with a doctor or health care staff?: 0. No   Patient's Response To:  Health Literacy and Transportation Is the patient able to respond to health literacy and transportation needs?: Yes Health Literacy - How often do you need to have someone help you when you read instructions, pamphlets, or other written material from your doctor or pharmacy?: Never In the past 12 months, has lack of transportation kept you from medical appointments or from getting medications?: No In the past 12 months, has lack of transportation kept you from meetings, work, or from getting things needed for daily living?: No   Home Assistive Devices / Equipment Home Assistive Devices/Equipment: Eyeglasses (readers only) Home Equipment: None   Prior Device Use: Indicate devices/aids used by the patient prior to current illness, exacerbation or injury? None of the above   Current Functional Level Cognition   Arousal/Alertness: Awake/alert Overall Cognitive Status: Impaired/Different from baseline Current Attention Level: Sustained Orientation Level: Oriented X4 Safety/Judgement: Decreased awareness of deficits, Decreased awareness of safety General Comments: continues with R inattention, however, benefits from visual cueing. Decreased carryover Attention: Sustained Sustained Attention: Impaired Sustained Attention Impairment: Verbal complex Awareness: Impaired Awareness Impairment: Emergent impairment Safety/Judgment: Impaired    Extremity Assessment (includes Sensation/Coordination)   Upper Extremity Assessment: RUE deficits/detail RUE Deficits / Details: reports recent fall on R shoulder  (nov) but achieving 85% movement back; today pts R UE is flaccid, slightly increased tone in hand. noted middle fingers PIP amputation RUE Sensation: decreased proprioception, decreased light touch RUE Coordination: decreased fine motor, decreased gross motor  Lower Extremity Assessment: Defer to PT evaluation RLE Deficits / Details: not moving actively to command, noted some stiffness throughout, pushes into resistance with leg flexed     ADLs   Overall ADL's : Needs assistance/impaired Grooming: Minimal assistance, Sitting Upper Body Dressing : Maximal assistance, Sitting Lower Body Dressing: Total assistance, +2 for physical assistance, +2 for safety/equipment, Sit to/from stand, Sitting/lateral leans Toilet Transfer: Maximal assistance, +2 for physical assistance, +2 for safety/equipment, Stand-pivot Functional mobility during ADLs: Maximal assistance, +2 for physical assistance, +2 for safety/equipment     Mobility   Overal bed mobility: Needs Assistance Bed Mobility: Rolling, Supine to Sit, Sit to Supine Rolling: Max assist Supine to sit: Max assist, +2 for physical assistance Sit to supine: Max assist, +2 for physical assistance General bed mobility comments: Rolling towards left to remove bedpan with maxA. Exiting towards right side of bed to sit up, cues for use of rail, assist at RLE and trunk to execute     Transfers   Overall transfer level: Needs assistance Equipment used: Ambulation equipment used Transfers: Sit to/from Stand Sit to Stand: Mod assist, +2 physical assistance Bed to/from chair/wheelchair/BSC transfer type:: Squat pivot Squat pivot transfers: Max assist, +2 physical assistance General transfer comment: Pt pulling up with LUE on Stedy to power up;  assist for foot placement to widen BOS. Visual/verbal/tactile cueing for midline     Ambulation / Gait / Stairs / Wheelchair Mobility         Posture / Balance Dynamic Sitting Balance Sitting balance - Comments:  Right lateral lean Balance Overall balance assessment: Needs assistance Sitting-balance support: Feet supported Sitting balance-Leahy Scale: Poor Sitting balance - Comments: Right lateral lean Postural control: Right lateral lean Standing balance-Leahy Scale: Zero Standing balance comment: unable to stand upright this session     Special needs/care consideration Diabetic management yes        Previous Home Environment (from acute therapy documentation) Living Arrangements: Alone  Lives With: Alone Available Help at Discharge: Family Type of Home: House Home Layout: One level Home Access: Stairs to enter Entrance Stairs-Rails: Left Entrance Stairs-Number of Steps: 4 Bathroom Shower/Tub: Health visitor: Handicapped height Bathroom Accessibility: Yes Home Care Services: No Additional Comments: brother in law present and states can stay with him   Discharge Living Setting Plans for Discharge Living Setting: Patient's home, Other (Comment) (may discharge to another family members home depending on LOF, set up info is for pt's home) Type of Home at Discharge: House Discharge Home Layout: One level Discharge Home Access: Stairs to enter Entrance Stairs-Rails: Left Entrance Stairs-Number of Steps: 4 Discharge Bathroom Shower/Tub: Walk-in shower Discharge Bathroom Toilet: Standard Discharge Bathroom Accessibility: Yes How Accessible: Accessible via walker Does the patient have any problems obtaining your medications?: No   Social/Family/Support Systems Anticipated Caregiver: daughter, Kennyth Arnold, is primary contact, will also have support from pt's brother in law Audie Box [phone]) and other family members Anticipated Caregiver's Contact Information: Kennyth Arnold [phone] Ability/Limitations of Caregiver: none specified, reviewed min assist level goals and 24/7 Caregiver Availability: 24/7 Discharge Plan Discussed with Primary Caregiver: Yes Is Caregiver In  Agreement with Plan?: Yes Does Caregiver/Family have Issues with Lodging/Transportation while Pt is in Rehab?: No     Goals Patient/Family Goal for Rehab: PT/OT min assist, SLP supervision to mod I Expected length of stay: 21-24 days Additional Information: Discharge plan: plan to discharge to pt's home with family providign 24/7 min assist.  Option to hire caregivers if needed. Pt/Family Agrees to Admission and willing to participate: Yes Program Orientation Provided & Reviewed with Pt/Caregiver Including Roles  & Responsibilities: Yes  Barriers to Discharge: Home environment access/layout, Insurance for SNF coverage     Decrease burden of Care through IP rehab admission: n/a     Possible need for SNF placement upon discharge:Not anticipated.  Plan discharge to pt's home with family providing 24/7 assist, option for hired caregivers if needed.  Reviewed min assist level goals for mobility/adls.       Patient Condition: This patient's condition remains as documented in the consult dated 06/23/22, in which the Rehabilitation Physician determined and documented that the patient's condition is appropriate for intensive rehabilitative care in an inpatient rehabilitation facility. Will admit to inpatient rehab today.   Preadmission Screen Completed By:  Stephania Fragmin, PT, DPT 06/24/2022 10:27 AM ______________________________________________________________________   Discussed status with Dr. Natale Lay on 06/24/22 at 10:41 AM  and received approval for admission today.   Admission Coordinator:  Stephania Fragmin, PT, DPT time 10:41 AM Dorna Bloom 06/24/22

## 2022-06-24 NOTE — Progress Notes (Signed)
Inpatient Rehabilitation Center Individual Statement of Services  Patient Name:  Jimmy Valentine  Date:  06/24/2022  Welcome to the Inpatient Rehabilitation Center.  Our goal is to provide you with an individualized program based on your diagnosis and situation, designed to meet your specific needs.  With this comprehensive rehabilitation program, you will be expected to participate in at least 3 hours of rehabilitation therapies Monday-Friday, with modified therapy programming on the weekends.  Your rehabilitation program will include the following services:  Physical Therapy (PT), Occupational Therapy (OT), Speech Therapy (ST), 24 hour per day rehabilitation nursing, Therapeutic Recreaction (TR), Neuropsychology, Care Coordinator, Rehabilitation Medicine, Nutrition Services, Pharmacy Services, and Other  Weekly team conferences will be held on Wednesdays to discuss your progress.  Your Inpatient Rehabilitation Care Coordinator will talk with you frequently to get your input and to update you on team discussions.  Team conferences with you and your family in attendance may also be held.  Expected length of stay: 21-24 Days  Overall anticipated outcome: min assist, SLP supervision to mod I   Depending on your progress and recovery, your program may change. Your Inpatient Rehabilitation Care Coordinator will coordinate services and will keep you informed of any changes. Your Inpatient Rehabilitation Care Coordinator's name and contact numbers are listed  below.  The following services may also be recommended but are not provided by the Inpatient Rehabilitation Center:   Home Health Rehabiltiation Services Outpatient Rehabilitation Services    Arrangements will be made to provide these services after discharge if needed.  Arrangements include referral to agencies that provide these services.  Your insurance has been verified to be:  [employer] Your primary doctor is:  Willow Ora,  MD  Pertinent information will be shared with your doctor and your insurance company.  Inpatient Rehabilitation Care Coordinator:  Lavera Guise, [place] [phone] or [phone]  Information discussed with and copy given to patient by: Andria Rhein, 06/24/2022, 3:41 PM

## 2022-06-24 NOTE — Care Management Important Message (Signed)
Important Message  Patient Details  Name: Jimmy Valentine MRN: [id number] Date of Birth: 1950/09/26   Medicare Important Message Given:  Yes     Dorena Bodo 06/24/2022, 2:46 PM

## 2022-06-24 NOTE — Discharge Summary (Addendum)
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 Swallow whole. Start taking on: Jun 25, 2022   clopidogrel 75 MG tablet Commonly known as: PLAVIX Take 1 tablet (75 mg total) by mouth daily. Start taking on: Jun 25, 2022   colchicine 0.6 MG tablet Take 1 tablet (0.6 mg total) by mouth 2 (two) times daily as needed.   hydrochlorothiazide 25 MG tablet Commonly known as: HYDRODIURIL Take 1 tablet (25 mg total) by mouth daily.   metFORMIN 1000 MG tablet Commonly known as: GLUCOPHAGE Take 1 tablet (1,000 mg total) by mouth 2 (two) times daily with a meal.   sildenafil 20 MG tablet Commonly known as: REVATIO TAKE 3 TO 4 TABLETS BY MOUTH EVERY DAY AS NEEDED What changed: See the new instructions.   simvastatin 40 MG tablet Commonly known as: ZOCOR TAKE 0.5 TABLETS (20 MG TOTAL) BY MOUTH AT BEDTIME.   valsartan 320 MG tablet Commonly known as: DIOVAN Take 1 tablet (320 mg total) by mouth daily.       LABORATORY STUDIES CBC    Component Value Date/Time   WBC 7.4 06/21/2022 0543   RBC 4.35 06/21/2022 0543   HGB 15.6 06/21/2022 0545   HCT 46.0 06/21/2022 0545   PLT 196  06/21/2022 0543   MCV 89.2 06/21/2022 0543   MCH 32.4 06/21/2022 0543   MCHC 36.3 (H) 06/21/2022 0543   RDW 12.2 06/21/2022 0543   LYMPHSABS 2.1 06/21/2022 0543   MONOABS 0.7 06/21/2022 0543   EOSABS 0.4 06/21/2022 0543   BASOSABS 0.0 06/21/2022 0543   CMP    Component Value Date/Time   NA 132 (L) 06/21/2022 0545   K 3.7 06/21/2022 0545   CL 96 (L) 06/21/2022 0545   CO2 20 (L) 06/21/2022 0543   GLUCOSE 224 (H) 06/21/2022 0545   BUN 20 06/21/2022 0545   CREATININE 1.00 06/21/2022 0545   CALCIUM 9.0 06/21/2022 0543   PROT 6.6 06/21/2022 0543   ALBUMIN 3.8 06/21/2022 0543   AST 24 06/21/2022 0543   ALT 34 06/21/2022 0543   ALKPHOS 66 06/21/2022 0543   BILITOT 0.7 06/21/2022 0543   GFRNONAA >60 06/21/2022 0543   GFRAA 85 (L) 12/05/2011 0532   COAGS Lab Results  Component Value Date   INR 1.1 06/21/2022   INR 0.94 12/01/2011   Lipid Panel    Component Value Date/Time   CHOL 173 06/22/2022 0328   TRIG 114 06/22/2022 0328   HDL 50 06/22/2022 0328   CHOLHDL 3.5 06/22/2022 0328   VLDL 23 06/22/2022 0328   LDLCALC 100 (H) 06/22/2022 0328   HgbA1C  Lab Results  Component Value Date  HGBA1C 6.7 (H) 03/03/2022   Urinalysis    Component Value Date/Time   COLORURINE YELLOW 12/01/2011 0913   APPEARANCEUR CLEAR 12/01/2011 0913   LABSPEC 1.018 12/01/2011 0913   PHURINE 7.0 12/01/2011 0913   GLUCOSEU NEGATIVE 12/01/2011 0913   HGBUR NEGATIVE 12/01/2011 0913   BILIRUBINUR NEGATIVE 12/01/2011 0913   KETONESUR NEGATIVE 12/01/2011 0913   PROTEINUR NEGATIVE 12/01/2011 0913   UROBILINOGEN 1.0 12/01/2011 0913   NITRITE NEGATIVE 12/01/2011 0913   LEUKOCYTESUR NEGATIVE 12/01/2011 0913   Urine Drug Screen No results found for: "LABOPIA", "COCAINSCRNUR", "LABBENZ", "AMPHETMU", "THCU", "LABBARB"  Alcohol Level    Component Value Date/Time   ETH <10 06/21/2022 0543   SIGNIFICANT DIAGNOSTIC STUDIES DG Swallowing Func-Speech Pathology  Result Date: 06/22/2022 Table  formatting from the original result was not included. Modified Barium Swallow Study Patient Details Name: Jimmy Valentine MRN: [id number] Date of Birth: 04/22/1950 Today's Date: 06/22/2022 HPI/PMH: HPI: Patient is a 72 y.o. male with PMH: arthritis, ED, gout, HLD, HTN, sleep apnea, DM, idiopathic sensorineural hearing loss (left). He presented to the ED on 06/21/22 via EMS from home for code stroke after acute onset of right sided weakness. En route to ED, patient spontaneously recovered completely with all symptoms but while still en route, his symptoms recurred. CT head was negative for acute intracranial hemorrhage. He received TNK. MRI brain ordered but not completed at time of SLP evaluation. Neurology reporting that patient's overall presentation is consistent with aute ischemic stroke within the left MCA territory. Clinical Impression: Clinical Impression: Pt presented with a primary oral dysphagia marked by decreased oral control and sensation, with large portions of barium escaping anteriorly from right side of oral cavity, intermittently without awareness.  Otherwise, pt demonstrated functional pharyngeal squeeze, trace residue in valleculae post-swallow, generally reliable laryngeal vestibule closure. There was a single incident of very trace aspiration of thin liquids when pt was following solids immediately with liquids.  A pill transferred readily through the esophagus.  It was difficult to view the proximal esophagus/PES due to obstruction by shoulder, but it is presumed flow from pharynx to esophagus was functional.  Recommend continuing a dysphagia 3 diet with nectar thick liquids for now. SLP will f/u to teach swallowing strategies to manage oral control and bolus release to pharynx. Anticipate progression to thin liquids in the next few days - pt will not need a repeat MBS> Factors that may increase risk of adverse event in presence of aspiration Rubye Oaks & Clearance Coots 2021): No data recorded  Recommendations/Plan: Swallowing Evaluation Recommendations Swallowing Evaluation Recommendations Recommendations: PO diet PO Diet Recommendation: Dysphagia 3 (Mechanical soft); Mildly thick liquids (Level 2, nectar thick) Liquid Administration via: Cup; Straw Medication Administration: Whole meds with liquid (whole pills with nectar) Supervision: Full assist for feeding Postural changes: Position pt fully upright for meals Oral care recommendations: Oral care BID (2x/day) Treatment Plan Treatment Plan Treatment recommendations: Therapy as outlined in treatment plan below Functional status assessment: Patient has had a recent decline in their functional status and demonstrates the ability to make significant improvements in function in a reasonable and predictable amount of time. Treatment frequency: Min 2x/week Treatment duration: 2 weeks Recommendations Recommendations for follow up therapy are one component of a multi-disciplinary discharge planning process, led by the attending physician.  Recommendations may be updated based on patient status, additional functional criteria and insurance authorization. Assessment: Orofacial Exam: Orofacial Exam Oral Cavity - Dentition: Adequate natural dentition Orofacial Anatomy: WFL Oral Motor/Sensory Function: Suspected cranial nerve impairment CN V -  Trigeminal: Not tested CN VII - Facial: Right motor impairment CN XII - Hypoglossal: WFL Anatomy: Anatomy: WFL Boluses Administered: Boluses Administered Boluses Administered: Thin liquids (Level 0); Mildly thick liquids (Level 2, nectar thick); Moderately thick liquids (Level 3, honey thick); Puree; Solid  Oral Impairment Domain: Oral Impairment Domain Lip Closure: Escape beyond mid-chin Tongue control during bolus hold: Escape to lateral buccal cavity/floor of mouth Bolus preparation/mastication: Slow prolonged chewing/mashing with complete recollection Bolus transport/lingual motion: Brisk tongue motion Oral residue: Trace  residue lining oral structures Location of oral residue : Tongue Initiation of pharyngeal swallow : Pyriform sinuses  Pharyngeal Impairment Domain: Pharyngeal Impairment Domain Soft palate elevation: No bolus between soft palate (SP)/pharyngeal wall (PW) Laryngeal elevation: Complete superior movement of thyroid cartilage with complete approximation of arytenoids to epiglottic petiole Anterior hyoid excursion: Complete anterior movement Epiglottic movement: Complete inversion Laryngeal vestibule closure: Incomplete, narrow column air/contrast in laryngeal vestibule Pharyngeal stripping wave : Present - diminished Pharyngeal contraction (A/P view only): N/A Pharyngoesophageal segment opening: -- (difficult to visualize due to obstruction by shoulder) Tongue base retraction: Trace column of contrast or air between tongue base and PPW Pharyngeal residue: Trace residue within or on pharyngeal structures Location of pharyngeal residue: Valleculae  Esophageal Impairment Domain: Esophageal Impairment Domain Esophageal clearance upright position: Complete clearance, esophageal coating Pill: Esophageal Impairment Domain Esophageal clearance upright position: Complete clearance, esophageal coating Penetration/Aspiration Scale Score: Penetration/Aspiration Scale Score 1.  Material does not enter airway: Mildly thick liquids (Level 2, nectar thick); Moderately thick liquids (Level 3, honey thick); Puree; Solid; Pill 7.  Material enters airway, passes BELOW cords and not ejected out despite cough attempt by patient: Thin liquids (Level 0) Compensatory Strategies: Compensatory Strategies Compensatory strategies: Yes Straw: Effective Effective Straw: Thin liquid (Level 0); Mildly thick liquid (Level 2, nectar thick)   General Information: Caregiver present: No  Diet Prior to this Study: Dysphagia 3 (mechanical soft); Mildly thick liquids (Level 2, nectar thick)   Temperature : Normal   Respiratory Status: WFL   Supplemental O2:  None (Room air)   History of Recent Intubation: No  Behavior/Cognition: Alert; Cooperative Self-Feeding Abilities: Able to self-feed Baseline vocal quality/speech: Normal Volitional Cough: Unable to elicit Volitional Swallow: Able to elicit Exam Limitations: Limited visibility Goal Planning: Prognosis for improved oropharyngeal function: Good No data recorded No data recorded Patient/Family Stated Goal: patient is thirsty Consulted and agree with results and recommendations: Patient Pain: Pain Assessment Pain Assessment: No/denies pain Faces Pain Scale: 4 Pain Location: L sciatic pain in sitting on EOB Pain Descriptors / Indicators: Discomfort; Grimacing Pain Intervention(s): Limited activity within patient's tolerance; Monitored during session; Repositioned End of Session: Start Time:SLP Start Time (ACUTE ONLY): 1306 Stop Time: SLP Stop Time (ACUTE ONLY): 1329 Time Calculation:SLP Time Calculation (min) (ACUTE ONLY): 23 min Charges: SLP Evaluations $ SLP Speech Visit: 1 Visit SLP Evaluations $BSS Swallow: 1 Procedure $MBS Swallow: 1 Procedure $ SLP EVAL LANGUAGE/SOUND PRODUCTION: 1 Procedure SLP visit diagnosis: SLP Visit Diagnosis: Dysphagia, oropharyngeal phase (R13.12) Past Medical History: Past Medical History: Diagnosis Date  Arthritis   DM2 (diabetes mellitus, type 2) (HCC)   Elevated LFTs   Erectile dysfunction   Gout 09/03/2011  RF colchicine    Hyperlipidemia 1990s  Hypertension 1980s  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 Samson Frederic,  MA CCC/SLP Clinical Specialist - Acute Care SLP Acute Rehabilitation Services Office number [phone]  ECHOCARDIOGRAM COMPLETE  Result Date: 06/22/2022    ECHOCARDIOGRAM REPORT   Patient Name:   Jimmy Valentine Date of Exam: 06/22/2022 Medical Rec #:  [id number]       Height:       69.0 in Accession #:    [id number]      Weight:       220.0 lb Date of Birth:  1950/12/01       BSA:          2.151 m Patient Age:    72 years        BP:           156/107 mmHg Patient Gender: M               HR:           60 bpm. Exam Location:  Inpatient Procedure: 2D Echo, Cardiac Doppler, Color Doppler and Intracardiac            Opacification Agent Indications:    Stroke  History:        Patient has prior history of Echocardiogram examinations, most                 recent 12/09/2017. Risk Factors:Sleep Apnea, Hypertension,                 Diabetes and Dyslipidemia.  Sonographer:    Milda Smart Referring Phys: [phone] ERIC LINDZEN  Sonographer Comments: Technically difficult study due to poor echo windows. Image acquisition challenging due to patient body habitus and Image acquisition challenging due to respiratory motion. Mid-intracavitary gradient obtained . IMPRESSIONS  1. Left ventricular ejection fraction, by estimation, is 65 to 70%. The left ventricle has normal function. The left ventricle has no regional wall motion abnormalities. There is mild concentric left ventricular hypertrophy. Left ventricular diastolic parameters are indeterminate.  2. Right ventricular systolic function is normal. The right ventricular size is normal.  3. The mitral valve is grossly normal. Trivial mitral valve regurgitation. No evidence of mitral stenosis.  4. The aortic valve has an indeterminant number of cusps. There is mild calcification of the aortic valve. There is mild thickening of the aortic valve. Aortic valve regurgitation is not visualized. Aortic valve sclerosis is present, with no evidence of  aortic valve stenosis.  5. The  inferior vena cava is normal in size with greater than 50% respiratory variability, suggesting right atrial pressure of 3 mmHg. Comparison(s): Prior images unable to be directly viewed, comparison made by report only. Conclusion(s)/Recommendation(s): Otherwise normal echocardiogram, with minor abnormalities described in the report. FINDINGS  Left Ventricle: Left ventricular ejection fraction, by estimation, is 65 to 70%. The left ventricle has normal function. The left ventricle has no regional wall motion abnormalities. Definity contrast agent was given IV to delineate the left ventricular  endocardial borders. The left ventricular internal cavity size was normal in size. There is mild concentric left ventricular hypertrophy. Left ventricular diastolic parameters are indeterminate. Right Ventricle: The right ventricular size is normal. Right vetricular wall thickness was not well visualized. Right ventricular systolic function is normal. Left Atrium: Left atrial size was normal in size. Right Atrium: Right atrial size was normal  in size. Pericardium: There is no evidence of pericardial effusion. Mitral Valve: The mitral valve is grossly normal. Trivial mitral valve regurgitation. No evidence of mitral valve stenosis. MV peak gradient, 3.2 mmHg. The mean mitral valve gradient is 2.0 mmHg. Tricuspid Valve: The tricuspid valve is grossly normal. Tricuspid valve regurgitation is trivial. No evidence of tricuspid stenosis. Aortic Valve: The aortic valve has an indeterminant number of cusps. There is mild calcification of the aortic valve. There is mild thickening of the aortic valve. Aortic valve regurgitation is not visualized. Aortic valve sclerosis is present, with no evidence of aortic valve stenosis. Pulmonic Valve: The pulmonic valve was not well visualized. Pulmonic valve regurgitation is trivial. No evidence of pulmonic stenosis. Aorta: The aortic root, ascending aorta, aortic arch and descending aorta are all  structurally normal, with no evidence of dilitation or obstruction. Venous: The inferior vena cava is normal in size with greater than 50% respiratory variability, suggesting right atrial pressure of 3 mmHg. IAS/Shunts: The atrial septum is grossly normal.  LEFT VENTRICLE PLAX 2D LVIDd:         4.90 cm     Diastology LVIDs:         2.70 cm     LV e' medial:    4.95 cm/s LV PW:         1.20 cm     LV E/e' medial:  15.8 LV IVS:        1.30 cm     LV e' lateral:   6.61 cm/s LVOT diam:     2.00 cm     LV E/e' lateral: 11.8 LV SV:         52 LV SV Index:   24 LVOT Area:     3.14 cm  LV Volumes (MOD) LV vol d, MOD A2C: 49.5 ml LV vol d, MOD A4C: 44.7 ml LV vol s, MOD A2C: 17.0 ml LV vol s, MOD A4C: 11.9 ml LV SV MOD A2C:     32.5 ml LV SV MOD A4C:     44.7 ml LV SV MOD BP:      32.6 ml RIGHT VENTRICLE RV S prime:     9.89 cm/s TAPSE (M-mode): 2.2 cm LEFT ATRIUM             Index        RIGHT ATRIUM           Index LA diam:        2.70 cm 1.25 cm/m   RA Area:     13.80 cm LA Vol (A2C):   36.5 ml 16.97 ml/m  RA Volume:   33.00 ml  15.34 ml/m LA Vol (A4C):   28.9 ml 13.43 ml/m LA Biplane Vol: 34.3 ml 15.94 ml/m  AORTIC VALVE LVOT Vmax:   104.30 cm/s LVOT Vmean:  74.950 cm/s LVOT VTI:    0.166 m  AORTA Ao Root diam: 3.30 cm Ao Asc diam:  3.20 cm MITRAL VALVE MV Area (PHT): 2.86 cm    SHUNTS MV Area VTI:   1.83 cm    Systemic VTI:  0.17 m MV Peak grad:  3.2 mmHg    Systemic Diam: 2.00 cm MV Mean grad:  2.0 mmHg MV Vmax:       0.90 m/s MV Vmean:      60.9 cm/s MV Decel Time: 266 msec MV E velocity: 78.10 cm/s MV A velocity: 72.50 cm/s MV E/A ratio:  1.08 Jodelle Red MD Electronically signed by Jodelle Red  MD Signature Date/Time: 06/22/2022/11:32:04 AM    Final    VAS US CAROTID  Result Date: 06/22/2022 Carotid Arterial Duplex Study Patient Name:  Jimmy Valentine  Date of Exam:   06/21/2022 Medical Rec #: [id number]        Accession #:    [id number] Date of Birth: Jun 26, 1950        Patient Gender: M  Patient Age:   57 years Exam Location:  [facility] Procedure:      VAS US CAROTID Referring Phys: ERIC LINDZEN --------------------------------------------------------------------------------  Indications:       CVA, Speech disturbance and Weakness.                    CTA of neck indicated right string sign at bulb and left                    60-79% high-grade stenosis at bulb. Risk Factors:      Hypertension, hyperlipidemia, Diabetes. Other Factors:     Sleep apnea, not compliant with C-Pap. Comparison Study:  No prior study on file Performing Technologist: Sherren Kerns RVS  Examination Guidelines: A complete evaluation includes B-mode imaging, spectral Doppler, color Doppler, and power Doppler as needed of all accessible portions of each vessel. Bilateral testing is considered an integral part of a complete examination. Limited examinations for reoccurring indications may be performed as noted.  Right Carotid Findings: +----------+--------+--------+--------+------------------+------------------+           PSV cm/sEDV cm/sStenosisPlaque DescriptionComments           +----------+--------+--------+--------+------------------+------------------+ CCA Prox  138     15                                intimal thickening +----------+--------+--------+--------+------------------+------------------+ CCA Distal128     14                                intimal thickening +----------+--------+--------+--------+------------------+------------------+ ICA Prox  111     22      1-39%   calcific                             +----------+--------+--------+--------+------------------+------------------+ ICA Mid   86      20                                                   +----------+--------+--------+--------+------------------+------------------+ ICA Distal61      13                                                    +----------+--------+--------+--------+------------------+------------------+ ECA       117     10                                                   +----------+--------+--------+--------+------------------+------------------+ +----------+--------+-------+--------+-------------------+  PSV cm/sEDV cmsDescribeArm Pressure (mmHG) +----------+--------+-------+--------+-------------------+ [id number]                                        +----------+--------+-------+--------+-------------------+ +---------+--------+--+--------+-+ VertebralPSV cm/s46EDV cm/s1 +---------+--------+--+--------+-+  Left Carotid Findings: +----------+--------+--------+--------+---------------------+------------------+           PSV cm/sEDV cm/sStenosisPlaque Description   Comments           +----------+--------+--------+--------+---------------------+------------------+ CCA Prox  124     16                                   intimal thickening +----------+--------+--------+--------+---------------------+------------------+ CCA Distal141     16                                   intimal thickening +----------+--------+--------+--------+---------------------+------------------+ ICA Prox  86      12      1-39%   calcific and         Shadowing                                            irregular                               +----------+--------+--------+--------+---------------------+------------------+ ICA Mid   107     25                                                      +----------+--------+--------+--------+---------------------+------------------+ ICA Distal107     27                                                      +----------+--------+--------+--------+---------------------+------------------+ ECA       169     9                                                        +----------+--------+--------+--------+---------------------+------------------+ +----------+--------+--------+--------+-------------------+           PSV cm/sEDV cm/sDescribeArm Pressure (mmHG) +----------+--------+--------+--------+-------------------+ Subclavian129                                         +----------+--------+--------+--------+-------------------+ +---------+--------+--+--------+--+ VertebralPSV cm/s45EDV cm/s14 +---------+--------+--+--------+--+   Summary: Right Carotid: Velocities in the right ICA are consistent with a 1-39% stenosis. Left Carotid: Velocities in the left ICA are consistent with a 1-39% stenosis. Vertebrals:  Bilateral vertebral arteries demonstrate antegrade flow. Subclavians: Normal flow hemodynamics were seen in bilateral subclavian              arteries. *See table(s) above  for measurements and observations.  Electronically signed by Sherald Hess MD on 06/22/2022 at 9:15:26 AM.    Final    MR BRAIN WO CONTRAST  Result Date: 06/22/2022 CLINICAL DATA:  72 year old male code stroke presentation yesterday with right side weakness, adherent thrombus in the left MCA M1. status post T NK. EXAM: MRI HEAD WITHOUT CONTRAST TECHNIQUE: Multiplanar, multiecho pulse sequences of the brain and surrounding structures were obtained without intravenous contrast. COMPARISON:  CT head, CTA head and neck yesterday. Previous brain MRI 05/02/2008. FINDINGS: Brain: Confluent, curvilinear area of restricted diffusion tracking about 3 cm from the left caudate/corona radiata toward the posterior left lentiform and posterior limb left internal capsule. T2 and FLAIR hyperintensity. Evidence of petechial hemorrhage on SWI in the lentiform Heidelberg classification 1b: HI2, confluent petechiae, no mass effect. No mass effect. No other diffusion restriction. No midline shift, mass effect, evidence of mass lesion, ventriculomegaly, extra-axial collection or acute intracranial  hemorrhage. Cervicomedullary junction and pituitary are within normal limits. Volume loss since 2010 appears to be generalized. Outside of the acute finding gray and white matter signal remains fairly normal for age. Minimal other nonspecific white matter T2 and FLAIR hyperintensity. No other chronic cerebral blood products. Brainstem and cerebellum remain normal for age. Vascular: Loss of the distal right vertebral artery flow void in keeping with right vertebral artery occlusion demonstrated by CTA yesterday. This is new compared to 2010. Other Major intracranial vascular flow voids are preserved. Skull and upper cervical spine: Negative for age visible cervical spine. Visualized bone marrow signal is within normal limits. 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 Orbits appear negative. Other: Trace chronic left mastoid air cell effusion is stable. Trace retained secretions in the nasopharynx now. Other visible internal auditory structures appear grossly normal. Negative visible scalp and face. IMPRESSION: 1. Positive for Left MCA territory lacunar type infarct tracking from the left corona radiata to the lentiform. Petechial hemorrhage (Heidelberg classification 1b: HI2). No malignant hemorrhagic transformation. No significant mass effect. 2. No other acute intracranial abnormality. 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 Electronically Signed   By: Odessa Fleming M.D.   On: 06/22/2022 06:50   CT HEAD WO CONTRAST ( )  Result Date: 06/21/2022 CLINICAL DATA:  72 year old male code stroke presentation with severe atherosclerosis and left M1 adherent thrombus on CTA this morning. Status post status post TNK. EXAM: CT HEAD WITHOUT CONTRAST TECHNIQUE: Contiguous axial images were obtained from the base of the skull through the vertex without intravenous contrast. RADIATION  DOSE REDUCTION: This exam was performed according to the departmental dose-optimization program which includes automated exposure control, adjustment of the mA and/or kV according to patient size and/or use of iterative reconstruction technique. COMPARISON:  CTA head and neck 0557 hours and earlier today. FINDINGS: Brain: Mild residual intravascular contrast. No acute intracranial hemorrhage identified. No midline shift, mass effect, or evidence of intracranial mass lesion. No ventriculomegaly. Unchanged chronic appearing left corona radiata/basal ganglia lacunar infarct. Elsewhere gray-white differentiation remain symmetric, stable and within normal limits. ASPECTS 10. Vascular: Residual intravascular contrast superimposed on severe calcified atherosclerosis at the skull base. Skull: No acute osseous abnormality identified. Sinuses/Orbits: Stable paranasal sinus mucosal thickening, inflammation. Other: MCA territory gray-white differentiation remains symmetric. No convincing acute cytotoxic edema. IMPRESSION: 1. Stable CT appearance of the brain since 0548 hours today. No acute intracranial hemorrhage or involving infarct identified. 2. Residual intravascular  contrast. Electronically Signed   By: Odessa Fleming M.D.   On: 06/21/2022 09:18   CT ANGIO HEAD NECK W WO CM  Result Date: 06/21/2022 CLINICAL DATA:  72 year old male code stroke. Questionable left MCA hyperdensity and severe right side weakness. EXAM: CT ANGIOGRAPHY HEAD AND NECK WITH AND WITHOUT CONTRAST TECHNIQUE: Multidetector CT imaging of the head and neck was performed using the standard protocol during bolus administration of intravenous contrast. Multiplanar CT image reconstructions and MIPs were obtained to evaluate the vascular anatomy. Carotid stenosis measurements (when applicable) are obtained utilizing NASCET criteria, using the distal internal carotid diameter as the denominator. RADIATION DOSE REDUCTION: This exam was performed according to the  departmental dose-optimization program which includes automated exposure control, adjustment of the mA and/or kV according to patient size and/or use of iterative reconstruction technique. CONTRAST:  75mL OMNIPAQUE IOHEXOL 350 MG/ML SOLN COMPARISON:  Plain head CT reported separately. FINDINGS: CTA NECK Skeleton: No acute or suspicious osseous lesion. Upper chest: Negative. Other neck: Negative. Aortic arch: Calcified aortic atherosclerosis.  3 vessel arch. Right carotid system: Mild brachiocephalic artery and right CCA plaque. But bulky calcified plaque at the right ICA origin and bulb with radiographic string sign on series 5, image 107, 106. The vessel remains patent and is mildly tortuous to the skull base. Left carotid system: No left CCA origin plaque. Mild soft plaque in the medial vessel at the level of the larynx on series 5, image 126 but no stenosis there. Complex combined soft and calcified plaque at the left ICA origin and bulb combining for high-grade stenosis numerically estimated at 65-70 % with respect to the distal vessel. See series 5 images 105 and 106. Additional calcified plaque downstream of that level. The vessel remains patent. Mild calcified plaque also just below the skull base. Vertebral arteries: Proximal right subclavian artery calcified atherosclerosis which does also involve the right vertebral artery origin which is only mildly stenotic on series 8, image 105. However, the vertebral artery shows decreased enhancement compared to the left side throughout the neck. It does remain patent to the skull base, but see intracranial findings below. Proximal left subclavian artery plaque without hemodynamically significant stenosis. Left vertebral artery origin remains normal. Left V1 segment calcified plaque with mild to moderate stenosis on series 7, image 284. Left vertebral V2 segment is patent without stenosis. Mild V3 calcified plaque, only mild stenosis there. CTA HEAD Posterior  circulation: Distal right vertebral artery is occluded in the V4 segment with upstream complex combined soft and calcified plaque and/or thrombus within the vessel. See series 7 images 151 through 137. Contralateral left V4 segment is heavily calcified especially at the left PICA origin with string sign stenosis there. But the left vertebral remains patent and supplies the basilar. The basilar artery is patent with a fairly normal caliber. Only mild basilar artery irregularity. Patent SCA and PCA origins. Posterior communicating arteries are diminutive or absent. Bilateral PCA branches are within normal limits. Anterior circulation: Right ICA siphon is patent with moderate calcified atherosclerosis but only mild right siphon stenosis. Right ICA terminus is patent, mildly bulbous and also partially calcified. Contralateral left ICA siphon is patent with moderate pre cavernous and severe supraclinoid calcified atherosclerosis. Moderate to severe left siphon supraclinoid stenosis series 7, image 98. However, the left carotid terminus remains patent. MCA and ACA origins are patent. Right A1 appears mildly dominant. Anterior communicating artery and bilateral ACA branches are within normal limits. Right MCA M1 segment is patent to the bifurcation.  No bifurcation stenosis. Right MCA branches are within normal limits. Left MCA M1 remains patent but is highly irregular and there is a 4 cm segment of thrombus in the vessel seen on series 11, image 21, series 5, image 47 and also convincingly on series 9 images 134 and 136. There is associated high-grade stenosis. But the left MCA bi/trifurcation remains patent. No discrete left MCA branch occlusion. Venous sinuses: Early contrast timing, not well evaluated. Anatomic variants: None significant. Review of the MIP images confirms the above findings IMPRESSION: 1. Negative for any complete anterior circulation large vessel occlusion but Positive for adherent Thrombus in the Left  MCA M1 segment resulting in high-grade stenosis. The left MCA bi/trifurcation and left M2 branches remain patent. 2. Superimposed Severe intracranial and cervical ICA Atherosclerosis resulting in: - distal Right Vertebral Artery (V4) OCCLUSION. - distal Left Vertebral Artery Severe (V4) stenosis. - Right ICA origin RADIOGRAPHIC STRING SIGN stenosis due to calcified plaque. - Left ICA origin stenosis due to complex plaque estimated 65-70%. - Severe Left ICA supraclinoid stenosis due to calcified plaque. 3. The above was preliminarily discussed by telephone with Dr. Caryl Pina on 06/21/2022 at 0558 hours. 4.  Aortic Atherosclerosis (ICD10-I70.0). Electronically Signed   By: Odessa Fleming M.D.   On: 06/21/2022 06:19   CT HEAD CODE STROKE WO CONTRAST  Result Date: 06/21/2022 CLINICAL DATA:  Code stroke. 72 year old male with severe right side weakness. EXAM: CT HEAD WITHOUT CONTRAST TECHNIQUE: Contiguous axial images were obtained from the base of the skull through the vertex without intravenous contrast. RADIATION DOSE REDUCTION: This exam was performed according to the departmental dose-optimization program which includes automated exposure control, adjustment of the mA and/or kV according to patient size and/or use of iterative reconstruction technique. COMPARISON:  None Available. FINDINGS: Brain: No acute intracranial hemorrhage identified. No midline shift, mass effect, or evidence of intracranial mass lesion. No ventriculomegaly. Chronic appearing, highly circumscribed lacunar infarct of the left corona radiata/basal ganglia series 2, image 21. No cytotoxic edema, acute cortically based infarct identified. No chronic encephalomalacia *CRASH * that no chronic cortical encephalomalacia identified. Vascular: Severe Calcified atherosclerosis at the skull base. Questionable hyperdensity left MCA M1. Skull: No acute osseous abnormality identified. Sinuses/Orbits: Bilateral paranasal sinus mucosal thickening and  opacification, moderate. No sinus fluid levels. Tympanic cavities and mastoids are well aerated. Other: No gaze deviation.  No acute scalp soft tissue finding. ASPECTS [facility] Stroke Program Early CT Score) Total score (0-10 with 10 being normal): 10 IMPRESSION: 1. Questionable hyperdense left MCA M1, but severe underlying intracranial atherosclerosis so might be artifact. 2. ASPECTS 10. 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 3.  No acute intracranial hemorrhage identified. 4. Study discussed by telephone with Dr. Otelia Limes on 06/21/2022 at 0551 hours. Electronically Signed   By: Odessa Fleming M.D.   On: 06/21/2022 06:06      HISTORY OF PRESENT ILLNESS  Jimmy Valentine is an 72 y.o. male with a PMHx of arthritis, ED, gout, HLD, HTN, sleep apnea, DM2, and idiopathic sensorineural hearing loss (left) who presents to the ED from home via EMS as a Code Stroke for acute onset of right sided weakness. He went to sleep at 10:00 PM and woke up in the middle of the night at 3 AM still feeling normal. At 4:00 AM he noted that he was having trouble turning over in his bed due to weakness. He called his neighbor at about 4:30 AM and the neighbor called EMS. EMS arrived  at about 4:50 AM and noted the patient to be hemiplegic on the right with right facial droop and severe dysarthria, but able to answer questions and follow commands. Vitals per EMS: BP 150/90, normal O2 sats, CBG 227. En route to the ED the patient spontaneously recovered completely with normal speech and full strength x 4. Later en route, at 0530, his symptoms recurred. Patient was evaluated and decided to be a candidate for TNKase and IV thrombolytic was administered in the ED.   HOSPITAL COURSE Stroke:  Left MCA territory infarct due to adherent thrombus in the left MCA M1 segment resulting in high-grade stenosis. 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  Code Stroke CT head questionable  hyperdense left MCA M1, but severe underlying intracranial atherosclerosis so may be artifact.  ASPECTS 10.   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  No acute intracranial hemorrhage identified. CTA head & neck negative for any complete anterior circulation LVO but positive for adherent thrombus in the left MCA M1 segment resulting in high-grade stenosis.  The left MCA bi/trifurcation and left M2 branches remain patent.  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  Aortic atherosclerosis. Carotid duplex right and left ICA velocities are consistent with a 1-39% stenosis CTA likely error in read after review and discussion with vascular.  MRI: Left MCA territory large basal ganglia infarct Petechial hemorrhage with no malignant hemorrhagic transformation or significant mass effect 2D Echo: EF 65 to 70%, trivial MVR LDL 91 HgbA1c 6.7 VTE prophylaxis -lovenox sq       Diet    DIET DYS 3 Room service appropriate? Yes; Fluid consistency: Nectar Thick        No antithrombotic prior to admission, now on aspirin and Plavix for 3 months due to intracranial atherosclerosis, then aspirin for monotherapy Therapy recommendations: CIR Disposition: Discharge to inpatient rehabilitation    Hypertension Home meds: Norvasc, HCTZ, valsartan Unstable BP <180/105 for 24 hours post-TNK Long-term BP goal normotensive   Hyperlipidemia Home meds: Simvastatin 40 mg LDL 91, goal < 70 Change home statin to atorvastatin 80 mg PO daily for high intensity statin dosing and LDL elevation above goal Continue statin at discharge   Diabetes type II Controlled Home  meds: Metformin HgbA1c 6.7, goal < 7.0 CBGs Recent Labs (last 2 labs)       Recent Labs    06/22/22 2210 06/23/22 0620 06/23/22 1103  GLUCAP 141* 165* 215*      SSI as needed Recommend close follow-up with PCP    Other Stroke Risk Factors Advanced Age >/= 66  Former cigarette smoker, quit smoking about 26 years ago Obesity, Body mass index is 32.49 kg/m., BMI >/= 30 associated with increased stroke risk, recommend weight loss, diet and exercise as appropriate  Hx stroke/TIA Coronary artery disease Obstructive sleep apnea, noton CPAP at home   Other Active Problems Gout on home colchicine  DISCHARGE EXAM Blood pressure (!) 143/87, pulse 76, temperature 98.5 F (36.9 C), temperature source Axillary, resp. rate 18, height 5\' 9"  (1.753 m), weight 99.8 kg, SpO2 97 %.  PHYSICAL EXAM Constitutional: Mildly obese elderly male not in distress  Psych: Affect appropriate to situation, calm and cooperative with exam Eyes:  No scleral injection HENT: No OP obstrucion MSK: no joint deformities or swelling Cardiovascular: Normal rate and regular rhythm.  Respiratory: Effort normal, non-labored breathing GI: Soft.  No distension. There is no tenderness.  Skin: WDI   Neuro: Mental Status: Patient is awake, alert, oriented to person, place, month, year, and situation. Patient is able to give a clear and coherent history. No signs of aphasia or neglect. There is mild dysarthria noted.  Cranial Nerves: II: Visual Fields are full. Pupils are equal, round, and reactive to light.   III,IV, VI: EOMI without gaze preference V: Facial sensation is intact and symmetric to light touch VII: Right facial droop present VIII: Hearing is intact to voice X: Palate elevates symmetrically XI: Shoulder shrug is symmetric. XII: Tongue protrudes midline without atrophy or fasciculations.  Motor: Tone is normal. Bulk is normal.  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  Good  antigravity strength on the left  Sensory: Patient reports equal sensation to light touch in bilateral upper and lower extremities.  Discharge Diet       Diet   DIET DYS 3 Room service appropriate? Yes; Fluid consistency: Nectar Thick   liquids  DISCHARGE PLAN Disposition:  Discharge to CIR aspirin 81 mg daily and clopidogrel 75 mg daily for secondary stroke prevention for 3 monthsa then aspirin alone. Ongoing stroke risk factor control by Primary Care Physician at time of discharge Follow-up PCP Wanda Plump, MD in 2 weeks. Follow-up in [facility] in 8 weeks with Dr. Pearlean Brownie, office to schedule an appointment.   35 minutes were spent preparing discharge.  Lanae Boast, AGACNP-BC [facility] Pager: [phone]  I have personally obtained history,examined this patient, reviewed notes, independently viewed imaging studies, participated in medical decision making and plan of care.ROS completed by me personally and pertinent positives fully documented  I have made any additions or clarifications directly to the above note. Agree with note above.    Delia Heady, MD Medical Director [facility] Pager: [phone] 06/24/2022 1:49 PM

## 2022-06-24 NOTE — TOC Transition Note (Signed)
Transition of Care [facility]) - CM/SW Discharge Note   Patient Details  Name: Jimmy Valentine MRN: [id number] Date of Birth: 10-31-1950  [facility] CM/SW Contact:  Kermit Balo, RN Phone Number: 06/24/2022, 10:16 AM   Clinical Narrative:    Pt is discharging to CIR today. CM signing off.    Final next level of care: IP Rehab Facility Barriers to Discharge: No Barriers Identified   Patient Goals and CMS Choice CMS Medicare.gov Compare Post Acute Care list provided to:: Patient Choice offered to / list presented to : Patient  Discharge Placement                         Discharge Plan and Services Additional resources added to the After Visit Summary for                                       Social Determinants of Health (SDOH) Interventions SDOH Screenings   Food Insecurity: No Food Insecurity (06/22/2022)  Housing: Low Risk  (06/22/2022)  Transportation Needs: No Transportation Needs (06/22/2022)  Utilities: Not At Risk (06/22/2022)  Alcohol Screen: Low Risk  (10/01/2020)  Depression (PHQ2-9): Low Risk  (03/03/2022)  Financial Resource Strain: Low Risk  (10/01/2020)  Physical Activity: Insufficiently Active (10/01/2020)  Social Connections: Socially Isolated (10/01/2020)  Stress: No Stress Concern Present (10/01/2020)  Tobacco Use: Medium Risk (06/22/2022)     Readmission Risk Interventions     No data to display

## 2022-06-24 NOTE — Plan of Care (Signed)
  Problem: Coping: Goal: Will verbalize positive feelings about self Outcome: Not Progressing   Problem: Nutritional: Goal: Maintenance of adequate nutrition will improve Outcome: Not Progressing   Problem: Activity: Goal: Risk for activity intolerance will decrease Outcome: Not Progressing

## 2022-06-24 NOTE — PMR Pre-admission (Signed)
PMR Admission Coordinator Pre-Admission Assessment   Patient: Jimmy Valentine is an 72 y.o., male MRN: [id number] DOB: 08/06/1950 Height: 5' 9" (175.3 cm) Weight: 99.8 kg                                                                                                                                                  Insurance Information HMO:     PPO: yes     PCP:      IPA:      80/20:      OTHER:  PRIMARY: [employer]      Policy#: [id number]      Subscriber:  CM Name: Bree      Phone#: [phone]     Fax#: [phone] Pre-Cert#: [id number] auth for CIR from Bree with [employer] for admit 5/22 with updates on 5/27 to Bree at fax listed above   Employer:  Benefits:  Phone #: [phone]     Name:  Eff. Date: 02/02/22     Deduct: $0      Out of Pocket Max: $4500 (met $0)      Life Max: n/a  CIR: $295/day for days 1-6      SNF: 20 full days Outpatient:      Co-Pay: $20/visit Home Health: 100%      Co-Pay:  DME: 80%     Co-Pay: 20% Providers:  SECONDARY:       Policy#:       Phone#:    Financial Counselor:       Phone#:    The "Data Collection Information Summary" for patients in Inpatient Rehabilitation Facilities with attached "Privacy Act Statement-Health Care Records" was provided and verbally reviewed with: Patient and Family   Emergency Contact Information Contact Information       Name Relation Home Work Mobile    Dozier,Stacy Daughter [phone]        Anderson,Brian Son     [phone]         Current Medical History  Patient Admitting Diagnosis: CVA    History of Present Illness: Pt is a 72 y/o male with PMH of gout, HTN, OSA, DM, and hearing loss who presented to [facility] on 5/19 with c/o sudden onset right sided weakness, facial droop, and dysarthria.  NIHSS 13.  CT head showed questionable hyperdense left MCA m1 and a chronic lacunar infarct of the left corona radiata/basal ganglia.  Pt was given TNK.  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  Carotid duplex showed bilateral stenosis 1-39%. MRI showed large MCA territory basal ganglia infarct with petechial hemorrhage with no hemorrhagic transformation or mass effect.  Echo showed trivial MVR.  Hgb A1c 6.7.  Neurology recommendations for DAPT x3 months, then aspirin monotherapy, change statin to atorvastatin and continue at discharge.  Pt tolerating   a D3 diet/nectar liquids.  Therapy ongoing and pt was recommended for CIR.    Complete NIHSS TOTAL: 11 Glasgow Coma Scale Score: 15   Patient's medical record from [facility] has been reviewed by the rehabilitation admission coordinator and physician.   Past Medical History      Past Medical History:  Diagnosis Date   Arthritis     DM2 (diabetes mellitus, type 2) (HCC)     Elevated LFTs     Erectile dysfunction     Gout 09/03/2011    RF colchicine     Hyperlipidemia 1990s   Hypertension 1980s   Hypogonadism male      Secondary hypogonadism,had a MRI, was prescribed testosterone by endocrinology   Sleep apnea      does't use cpap   Sudden hearing loss, left 2017    idiopathic sensorineural hearing loss      Has the patient had major surgery during 100 days prior to admission? No   Family History  family history includes Alzheimer's disease in his mother; Coronary artery disease in his mother; Diabetes in his sister; Hypertension in his mother.     Current Medications    Current Facility-Administered Medications:    acetaminophen (TYLENOL) tablet 650 mg, 650 mg, Oral, Q4H PRN **OR** acetaminophen (TYLENOL) 160 MG/5ML solution 650 mg, 650 mg, Per Tube, Q4H PRN **OR** acetaminophen (TYLENOL) suppository 650 mg, 650 mg, Rectal, Q4H PRN, Lindzen, Eric, MD   amLODipine (NORVASC) tablet 10 mg, 10 mg, Oral, Daily, Lindzen, Eric, MD, 10 mg at 06/23/22 0936   aspirin EC tablet 81 mg, 81 mg, Oral, Daily, Lehner, Erin C, NP, 81 mg at 06/23/22 0937   clopidogrel (PLAVIX) tablet 75 mg, 75 mg,  Oral, Daily, Lehner, Erin C, NP, 75 mg at 06/23/22 0936   colchicine tablet 0.6 mg, 0.6 mg, Oral, BID PRN, Lindzen, Eric, MD   enoxaparin (LOVENOX) injection 40 mg, 40 mg, Subcutaneous, Q24H, Wilson, Heather W, RPH, 40 mg at 06/23/22 0936   insulin aspart (novoLOG) injection 0-15 Units, 0-15 Units, Subcutaneous, TID WC, Lindzen, Eric, MD, 3 Units at 06/24/22 0636   irbesartan (AVAPRO) tablet 37.5 mg, 37.5 mg, Oral, Daily, Lindzen, Eric, MD, 37.5 mg at 06/23/22 0936   pantoprazole (PROTONIX) EC tablet 40 mg, 40 mg, Oral, QHS, Wilson, Heather W, RPH, 40 mg at 06/23/22 2017   senna-docusate (Senokot-S) tablet 1 tablet, 1 tablet, Oral, QHS PRN, Lindzen, Eric, MD   simvastatin (ZOCOR) tablet 20 mg, 20 mg, Oral, QHS, Lindzen, Eric, MD, 20 mg at 06/23/22 2017   sodium chloride flush (NS) 0.9 % injection 3 mL, 3 mL, Intravenous, Once, Wickline, Donald, MD   Patients Current Diet:  Diet Order                  DIET DYS 3 Room service appropriate? Yes; Fluid consistency: Nectar Thick  Diet effective now                         Precautions / Restrictions Precautions Precautions: Fall Precaution Comments: R hemiparesis, inattention Restrictions Weight Bearing Restrictions: No    Has the patient had 2 or more falls or a fall with injury in the past year?No   Prior Activity Level Community (5-7x/wk): independent prior to admit, no DME used, driving, retired   Prior Functional Level Prior Function Prior Level of Function : Independent/Modified Independent, Driving, History of Falls (last six months) ADLs Comments: not working, but driving   Self   Care: Did the patient need help bathing, dressing, using the toilet or eating?  Independent   Indoor Mobility: Did the patient need assistance with walking from room to room (with or without device)? Independent   Stairs: Did the patient need assistance with internal or external stairs (with or without device)? Independent   Functional  Cognition: Did the patient need help planning regular tasks such as shopping or remembering to take medications? Independent   Patient Information Are you of Hispanic, Latino/a,or Spanish origin?: A. No, not of Hispanic, Latino/a, or Spanish origin What is your race?: A. White Do you need or want an interpreter to communicate with a doctor or health care staff?: 0. No   Patient's Response To:  Health Literacy and Transportation Is the patient able to respond to health literacy and transportation needs?: Yes Health Literacy - How often do you need to have someone help you when you read instructions, pamphlets, or other written material from your doctor or pharmacy?: Never In the past 12 months, has lack of transportation kept you from medical appointments or from getting medications?: No In the past 12 months, has lack of transportation kept you from meetings, work, or from getting things needed for daily living?: No   Home Assistive Devices / Equipment Home Assistive Devices/Equipment: Eyeglasses (readers only) Home Equipment: None   Prior Device Use: Indicate devices/aids used by the patient prior to current illness, exacerbation or injury? None of the above   Current Functional Level Cognition   Arousal/Alertness: Awake/alert Overall Cognitive Status: Impaired/Different from baseline Current Attention Level: Sustained Orientation Level: Oriented X4 Safety/Judgement: Decreased awareness of deficits, Decreased awareness of safety General Comments: continues with R inattention, however, benefits from visual cueing. Decreased carryover Attention: Sustained Sustained Attention: Impaired Sustained Attention Impairment: Verbal complex Awareness: Impaired Awareness Impairment: Emergent impairment Safety/Judgment: Impaired    Extremity Assessment (includes Sensation/Coordination)   Upper Extremity Assessment: RUE deficits/detail RUE Deficits / Details: reports recent fall on R shoulder  (nov) but achieving 85% movement back; today pts R UE is flaccid, slightly increased tone in hand. noted middle fingers PIP amputation RUE Sensation: decreased proprioception, decreased light touch RUE Coordination: decreased fine motor, decreased gross motor  Lower Extremity Assessment: Defer to PT evaluation RLE Deficits / Details: not moving actively to command, noted some stiffness throughout, pushes into resistance with leg flexed     ADLs   Overall ADL's : Needs assistance/impaired Grooming: Minimal assistance, Sitting Upper Body Dressing : Maximal assistance, Sitting Lower Body Dressing: Total assistance, +2 for physical assistance, +2 for safety/equipment, Sit to/from stand, Sitting/lateral leans Toilet Transfer: Maximal assistance, +2 for physical assistance, +2 for safety/equipment, Stand-pivot Functional mobility during ADLs: Maximal assistance, +2 for physical assistance, +2 for safety/equipment     Mobility   Overal bed mobility: Needs Assistance Bed Mobility: Rolling, Supine to Sit, Sit to Supine Rolling: Max assist Supine to sit: Max assist, +2 for physical assistance Sit to supine: Max assist, +2 for physical assistance General bed mobility comments: Rolling towards left to remove bedpan with maxA. Exiting towards right side of bed to sit up, cues for use of rail, assist at RLE and trunk to execute     Transfers   Overall transfer level: Needs assistance Equipment used: Ambulation equipment used Transfers: Sit to/from Stand Sit to Stand: Mod assist, +2 physical assistance Bed to/from chair/wheelchair/BSC transfer type:: Squat pivot Squat pivot transfers: Max assist, +2 physical assistance General transfer comment: Pt pulling up with LUE on Stedy to power up;   assist for foot placement to widen BOS. Visual/verbal/tactile cueing for midline     Ambulation / Gait / Stairs / Wheelchair Mobility         Posture / Balance Dynamic Sitting Balance Sitting balance - Comments:  Right lateral lean Balance Overall balance assessment: Needs assistance Sitting-balance support: Feet supported Sitting balance-Leahy Scale: Poor Sitting balance - Comments: Right lateral lean Postural control: Right lateral lean Standing balance-Leahy Scale: Zero Standing balance comment: unable to stand upright this session     Special needs/care consideration Diabetic management yes        Previous Home Environment (from acute therapy documentation) Living Arrangements: Alone  Lives With: Alone Available Help at Discharge: Family Type of Home: House Home Layout: One level Home Access: Stairs to enter Entrance Stairs-Rails: Left Entrance Stairs-Number of Steps: 4 Bathroom Shower/Tub: Walk-in shower Bathroom Toilet: Handicapped height Bathroom Accessibility: Yes Home Care Services: No Additional Comments: brother in law present and states can stay with him   Discharge Living Setting Plans for Discharge Living Setting: Patient's home, Other (Comment) (may discharge to another family members home depending on LOF, set up info is for pt's home) Type of Home at Discharge: House Discharge Home Layout: One level Discharge Home Access: Stairs to enter Entrance Stairs-Rails: Left Entrance Stairs-Number of Steps: 4 Discharge Bathroom Shower/Tub: Walk-in shower Discharge Bathroom Toilet: Standard Discharge Bathroom Accessibility: Yes How Accessible: Accessible via walker Does the patient have any problems obtaining your medications?: No   Social/Family/Support Systems Anticipated Caregiver: daughter, Stacy, is primary contact, will also have support from pt's brother in law (John Lynch [phone]) and other family members Anticipated Caregiver's Contact Information: Stacy [phone] Ability/Limitations of Caregiver: none specified, reviewed min assist level goals and 24/7 Caregiver Availability: 24/7 Discharge Plan Discussed with Primary Caregiver: Yes Is Caregiver In  Agreement with Plan?: Yes Does Caregiver/Family have Issues with Lodging/Transportation while Pt is in Rehab?: No     Goals Patient/Family Goal for Rehab: PT/OT min assist, SLP supervision to mod I Expected length of stay: 21-24 days Additional Information: Discharge plan: plan to discharge to pt's home with family providign 24/7 min assist.  Option to hire caregivers if needed. Pt/Family Agrees to Admission and willing to participate: Yes Program Orientation Provided & Reviewed with Pt/Caregiver Including Roles  & Responsibilities: Yes  Barriers to Discharge: Home environment access/layout, Insurance for SNF coverage     Decrease burden of Care through IP rehab admission: n/a     Possible need for SNF placement upon discharge:Not anticipated.  Plan discharge to pt's home with family providing 24/7 assist, option for hired caregivers if needed.  Reviewed min assist level goals for mobility/adls.       Patient Condition: This patient's condition remains as documented in the consult dated 06/23/22, in which the Rehabilitation Physician determined and documented that the patient's condition is appropriate for intensive rehabilitative care in an inpatient rehabilitation facility. Will admit to inpatient rehab today.   Preadmission Screen Completed By:  Caitlin E Warren, PT, DPT 06/24/2022 10:27 AM ______________________________________________________________________   Discussed status with Dr. Riddick Nuon on 06/24/22 at 10:41 AM  and received approval for admission today.   Admission Coordinator:  Caitlin E Warren, PT, DPT time 10:41 AM /Date 06/24/22   

## 2022-06-24 NOTE — H&P (Signed)
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 Code stroke initiated. CT head showed questionable hyperdense left MCA m1 and a chronic lacunar infarct of the left corona radiata/basal ganglia. He was administered tenecteplase. 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 Carotid duplex showed bilateral stenosis 1-39%. MRI showed large MCA territory basal ganglia infarct with petechial hemorrhage with no hemorrhagic transformation or mass effect. Echo showed EF 65-70%, trivial MVR. Hgb A1c is 6.7%. LDL 91. Started for DAPT x 3 months for intracranial atherosclerosis, then aspirin monotherapy, change statin to atorvastatin and continue at discharge.    On 5/19, patient's bedside RN noted worsening right-sided weakness from her initial assessment (initially slight pronator drift, now minimal antigravity strength noted) and neurology was notified while on the unit at 08:30 AM. Assessed by Dr. Viviann Spare.   Patient's right-sided weakness noted to be fluctuating but patient was still weaker on the right than bedside RN's initial assessment.  Stat CT head order placed and did not show new acute changes.  Patient has right facial droop, withdrawals to pain on right leg. No right upper extremity movement. He is tolerating a D3 diet/nectar liquids.  Reports he feels constipated.    His PMH is significant for arthritis, ED, gout, HLD, HTN, sleep apnea, DM and idiopathic sensorineural hearing loss (left)    Niece and son at bedside. Has daughter who is supportive as well. He lives alone. Patient has not had regular BM since admission. Reports left sciatica  with mild symptoms currently. Brother in law can provide assistance after discharge.    Review of Systems  Constitutional:  Positive for fever. Negative for chills.  HENT:  Positive for hearing loss. Negative for sore throat.   Eyes:  Negative for blurred vision and double vision.  Respiratory:  Negative for cough and shortness of breath.   Cardiovascular:  Negative for chest pain and palpitations.  Gastrointestinal:  Positive for constipation. Negative for abdominal pain, nausea and vomiting.  Genitourinary:  Negative for dysuria and urgency.  Musculoskeletal:  Negative for neck pain.       Left sciatica>>mild symptoms currently  Skin:  Negative for rash.  Neurological:  Positive for focal weakness. Negative for dizziness, sensory change and headaches.  Psychiatric/Behavioral:  Negative for depression. The patient does not have insomnia.         Past Medical History:  Diagnosis Date   Arthritis     DM2 (diabetes mellitus, type 2) (HCC)     Elevated LFTs     Erectile dysfunction     Gout 09/03/2011    RF colchicine     Hyperlipidemia 1990s   Hypertension 1980s   Hypogonadism male      Secondary hypogonadism,had a MRI, was prescribed testosterone by endocrinology   Sleep apnea      does't use cpap   Sudden hearing loss, left 2017    idiopathic sensorineural hearing loss         Past Surgical History:  Procedure Laterality Date   TOTAL KNEE ARTHROPLASTY Right 2005   TOTAL KNEE ARTHROPLASTY   12/04/2011    LEFT-- TOTAL KNEE ARTHROPLASTY;  Surgeon: Eugenia Mcalpine, MD;  Location: [facility];  Service: Orthopedics;  Laterality: Left;         Family History  Problem Relation Age of Onset   Hypertension Mother     Coronary artery disease Mother          M CABG at age 55s   Alzheimer's disease Mother     Diabetes Sister     Colon cancer Neg Hx     Prostate cancer Neg Hx     Stomach cancer Neg Hx     Rectal cancer Neg Hx      Social History:  reports that he quit smoking about  26 years ago. His smoking use included cigarettes. He has a 15.00 pack-year smoking history. He has never used smokeless tobacco. He reports current alcohol use of about 12.0 standard drinks of alcohol per week. He reports that he does not use drugs. Allergies: No Known Allergies       Medications Prior to Admission  Medication Sig Dispense Refill   amLODipine (NORVASC) 10 MG tablet TAKE 1 TABLET BY MOUTH EVERY DAY 90 tablet 1   metFORMIN (GLUCOPHAGE) 1000 MG tablet Take 1 tablet (1,000 mg total) by mouth 2 (two) times daily with a meal. 180 tablet 1   simvastatin (ZOCOR) 40 MG tablet TAKE 0.5 TABLETS (20 MG TOTAL) BY MOUTH AT BEDTIME. 45 tablet 1   valsartan (DIOVAN) 320 MG tablet Take 1 tablet (320 mg total) by mouth daily. 90 tablet 1   colchicine 0.6 MG tablet Take 1 tablet (0.6 mg total) by mouth 2 (two) times daily as needed. (Patient not taking: Reported on 06/21/2022) 60 tablet 6   sildenafil (REVATIO) 20 MG tablet TAKE 3 TO 4 TABLETS BY MOUTH EVERY DAY AS NEEDED (Patient taking differently: Take 60-80 mg by mouth daily as needed (as directed).) 270 tablet 0          Home: Home Living Family/patient expects to be discharged to:: Private residence Living Arrangements: Alone Available Help at Discharge: Family Type of Home: House Home Access: Stairs to enter Secretary/administrator of Steps: 4 Entrance Stairs-Rails: Left Home Layout: One level Bathroom Shower/Tub: Health visitor: Handicapped height Bathroom Accessibility: Yes Home Equipment: None Additional Comments: brother in law present and states can stay with him  Lives With: Alone   Functional History: Prior Function Prior Level of Function : Independent/Modified Independent, Driving, History of Falls (last six months) ADLs Comments: not working, but driving   Functional Status:  Mobility: Bed Mobility Overal bed mobility: Needs Assistance Bed Mobility: Rolling, Supine to Sit, Sit to Supine Rolling:  Max assist Supine to sit: Max assist, +2 for physical assistance Sit to supine: Max assist, +2 for physical assistance General bed mobility comments: Rolling towards left to remove bedpan with maxA. Exiting towards right side of bed to sit up, cues for use of rail, assist at RLE and trunk to execute Transfers Overall transfer level: Needs assistance Equipment used: Ambulation equipment used Transfers: Sit to/from Stand Sit to Stand: Mod assist, +2 physical assistance Bed to/from chair/wheelchair/BSC transfer type:: Squat pivot Squat pivot transfers: Max assist, +2 physical assistance General transfer comment: Pt pulling up with LUE on Stedy to power up; assist for foot placement to widen BOS. Visual/verbal/tactile cueing for midline   ADL: ADL Overall ADL's : Needs assistance/impaired Grooming: Minimal assistance, Sitting Upper Body Dressing : Maximal assistance, Sitting Lower Body Dressing: Total assistance, +2 for physical assistance, +2 for safety/equipment, Sit to/from stand, Sitting/lateral leans Toilet Transfer: Maximal assistance, +  2 for physical assistance, +2 for safety/equipment, Stand-pivot Functional mobility during ADLs: Maximal assistance, +2 for physical assistance, +2 for safety/equipment   Cognition: Cognition Overall Cognitive Status: Impaired/Different from baseline Arousal/Alertness: Awake/alert Orientation Level: Oriented X4 Year: 2024 Month: May Day of Week: Correct Attention: Sustained Sustained Attention: Impaired Sustained Attention Impairment: Verbal complex Awareness: Impaired Awareness Impairment: Emergent impairment Safety/Judgment: Impaired Cognition Arousal/Alertness: Awake/alert Behavior During Therapy: WFL for tasks assessed/performed Overall Cognitive Status: Impaired/Different from baseline Area of Impairment: Attention, Safety/judgement, Problem solving, Awareness Current Attention Level: Sustained Safety/Judgement: Decreased awareness of  deficits, Decreased awareness of safety Awareness: Emergent Problem Solving: Slow processing, Difficulty sequencing, Requires tactile cues, Requires verbal cues General Comments: continues with R inattention, however, benefits from visual cueing. Decreased carryover   Physical Exam: Blood pressure (!) 160/79, pulse 74, temperature 98.5 F (36.9 C), temperature source Axillary, resp. rate 18, height 5\' 9"  (1.753 m), weight 99.8 kg, SpO2 98 %.            General: No apparent distress, obese HEENT: Head is normocephalic, atraumatic, PERRLA, EOMI, sclera anicteric, oral mucosa pink and moist, dentition intact, ext ear canals clear,  Neck: Supple without JVD or lymphadenopathy Heart: Reg rate and rhythm. No murmurs rubs or gallops Chest: CTA bilaterally without wheezes, rales, or rhonchi; no distress Abdomen: Soft, non-tender, non-distended, bowel sounds positive. Extremities: No clubbing, cyanosis, or edema. Pulses are 2+ Psych: A little flat. Pt is cooperative Skin: Clean and intact without signs of breakdown Neuro:.  Alert and oriented x 3, follows commands, visual fields are full, PERRLA, EOMI, right facial droop noted, hearing intact, shoulder shrug is decreased on the right, tongue protrudes to the right Able to name 4/5 items and able to repeat, short-term memory -able to recall 0 out of 3 items Patient has dysarthria, also noted mild aphasia expressive more than receptive Finger-nose intact on the left Dense right hemiplegia, 0 out of 5 strength throughout Left strength 5 out of 5 bilateral upper extremities and lower extremities Sensation intact light touch in all 4 extremities No ankle clonus bilaterally Musculoskeletal:  Evidence of bilateral TKA healed incisions Increased tone at right shoulder and elbow flexors and extensors Mild hypertonia right finger flexors and wrist flexors,  mild to moderate at right knee extensors, right ankle plantar flexors 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  Glucose, capillary     Status: Abnormal    Collection Time: 06/22/22  5:35 PM  Result Value Ref Range    Glucose-Capillary 198 (H) 70 - 99 mg/dL      Comment: Glucose reference range applies only to samples taken after fasting for at least 8 hours.  Glucose, capillary     Status: Abnormal    Collection Time: 06/22/22 10:10 PM  Result Value Ref Range    Glucose-Capillary 141 (H) 70 - 99 mg/dL  Comment: Glucose reference range applies only to samples taken after fasting for at least 8 hours.  Glucose, capillary     Status: Abnormal    Collection Time: 06/23/22  6:20 AM  Result Value Ref Range    Glucose-Capillary 165 (H) 70 - 99 mg/dL      Comment: Glucose reference range applies only to samples taken after fasting for at least 8 hours.    Comment 1 Notify RN      Comment 2 Document in Chart    Glucose, capillary     Status: Abnormal    Collection Time: 06/23/22 11:03 AM  Result Value Ref Range    Glucose-Capillary 215 (H) 70 - 99 mg/dL      Comment: Glucose reference range applies only to samples taken after fasting for at least 8 hours.  Glucose, capillary     Status: Abnormal    Collection Time: 06/23/22  4:43 PM  Result Value Ref Range    Glucose-Capillary 195 (H) 70 - 99 mg/dL      Comment: Glucose reference range applies only to samples taken after fasting for at least 8 hours.  Glucose, capillary     Status: Abnormal    Collection Time:  06/23/22  9:04 PM  Result Value Ref Range    Glucose-Capillary 155 (H) 70 - 99 mg/dL      Comment: Glucose reference range applies only to samples taken after fasting for at least 8 hours.    Comment 1 Notify RN      Comment 2 Document in Chart    Glucose, capillary     Status: Abnormal    Collection Time: 06/24/22  3:46 AM  Result Value Ref Range    Glucose-Capillary 159 (H) 70 - 99 mg/dL      Comment: Glucose reference range applies only to samples taken after fasting for at least 8 hours.  Glucose, capillary     Status: Abnormal    Collection Time: 06/24/22  6:35 AM  Result Value Ref Range    Glucose-Capillary 161 (H) 70 - 99 mg/dL      Comment: Glucose reference range applies only to samples taken after fasting for at least 8 hours.       Imaging Results (Last 48 hours)  DG Swallowing Func-Speech Pathology   Result Date: 06/22/2022 Table formatting from the original result was not included. Modified Barium Swallow Study Patient Details Name: MAXI VAIL MRN: [id number] Date of Birth: 1950-12-21 Today's Date: 06/22/2022 HPI/PMH: HPI: Patient is a 72 y.o. male with PMH: arthritis, ED, gout, HLD, HTN, sleep apnea, DM, idiopathic sensorineural hearing loss (left). He presented to the ED on 06/21/22 via EMS from home for code stroke after acute onset of right sided weakness. En route to ED, patient spontaneously recovered completely with all symptoms but while still en route, his symptoms recurred. CT head was negative for acute intracranial hemorrhage. He received TNK. MRI brain ordered but not completed at time of SLP evaluation. Neurology reporting that patient's overall presentation is consistent with aute ischemic stroke within the left MCA territory. Clinical Impression: Clinical Impression: Pt presented with a primary oral dysphagia marked by decreased oral control and sensation, with large portions of barium escaping anteriorly from right side of oral cavity, intermittently without  awareness.  Otherwise, pt demonstrated functional pharyngeal squeeze, trace residue in valleculae post-swallow, generally reliable laryngeal vestibule closure. There was a single incident of very trace aspiration of thin liquids when pt was following solids immediately with  liquids.  A pill transferred readily through the esophagus.  It was difficult to view the proximal esophagus/PES due to obstruction by shoulder, but it is presumed flow from pharynx to esophagus was functional.  Recommend continuing a dysphagia 3 diet with nectar thick liquids for now. SLP will f/u to teach swallowing strategies to manage oral control and bolus release to pharynx. Anticipate progression to thin liquids in the next few days - pt will not need a repeat MBS> Factors that may increase risk of adverse event in presence of aspiration Rubye Oaks & Clearance Coots 2021): No data recorded Recommendations/Plan: Swallowing Evaluation Recommendations Swallowing Evaluation Recommendations Recommendations: PO diet PO Diet Recommendation: Dysphagia 3 (Mechanical soft); Mildly thick liquids (Level 2, nectar thick) Liquid Administration via: Cup; Straw Medication Administration: Whole meds with liquid (whole pills with nectar) Supervision: Full assist for feeding Postural changes: Position pt fully upright for meals Oral care recommendations: Oral care BID (2x/day) Treatment Plan Treatment Plan Treatment recommendations: Therapy as outlined in treatment plan below Functional status assessment: Patient has had a recent decline in their functional status and demonstrates the ability to make significant improvements in function in a reasonable and predictable amount of time. Treatment frequency: Min 2x/week Treatment duration: 2 weeks Recommendations Recommendations for follow up therapy are one component of a multi-disciplinary discharge planning process, led by the attending physician.  Recommendations may be updated based on patient status, additional  functional criteria and insurance authorization. Assessment: Orofacial Exam: Orofacial Exam Oral Cavity - Dentition: Adequate natural dentition Orofacial Anatomy: WFL Oral Motor/Sensory Function: Suspected cranial nerve impairment CN V - Trigeminal: Not tested CN VII - Facial: Right motor impairment CN XII - Hypoglossal: WFL Anatomy: Anatomy: WFL Boluses Administered: Boluses Administered Boluses Administered: Thin liquids (Level 0); Mildly thick liquids (Level 2, nectar thick); Moderately thick liquids (Level 3, honey thick); Puree; Solid  Oral Impairment Domain: Oral Impairment Domain Lip Closure: Escape beyond mid-chin Tongue control during bolus hold: Escape to lateral buccal cavity/floor of mouth Bolus preparation/mastication: Slow prolonged chewing/mashing with complete recollection Bolus transport/lingual motion: Brisk tongue motion Oral residue: Trace residue lining oral structures Location of oral residue : Tongue Initiation of pharyngeal swallow : Pyriform sinuses  Pharyngeal Impairment Domain: Pharyngeal Impairment Domain Soft palate elevation: No bolus between soft palate (SP)/pharyngeal wall (PW) Laryngeal elevation: Complete superior movement of thyroid cartilage with complete approximation of arytenoids to epiglottic petiole Anterior hyoid excursion: Complete anterior movement Epiglottic movement: Complete inversion Laryngeal vestibule closure: Incomplete, narrow column air/contrast in laryngeal vestibule Pharyngeal stripping wave : Present - diminished Pharyngeal contraction (A/P view only): N/A Pharyngoesophageal segment opening: -- (difficult to visualize due to obstruction by shoulder) Tongue base retraction: Trace column of contrast or air between tongue base and PPW Pharyngeal residue: Trace residue within or on pharyngeal structures Location of pharyngeal residue: Valleculae  Esophageal Impairment Domain: Esophageal Impairment Domain Esophageal clearance upright position: Complete clearance,  esophageal coating Pill: Esophageal Impairment Domain Esophageal clearance upright position: Complete clearance, esophageal coating Penetration/Aspiration Scale Score: Penetration/Aspiration Scale Score 1.  Material does not enter airway: Mildly thick liquids (Level 2, nectar thick); Moderately thick liquids (Level 3, honey thick); Puree; Solid; Pill 7.  Material enters airway, passes BELOW cords and not ejected out despite cough attempt by patient: Thin liquids (Level 0) Compensatory Strategies: Compensatory Strategies Compensatory strategies: Yes Straw: Effective Effective Straw: Thin liquid (Level 0); Mildly thick liquid (Level 2, nectar thick)   General Information: Caregiver present: No  Diet Prior to this Study: Dysphagia 3 (mechanical soft); Mildly  thick liquids (Level 2, nectar thick)   Temperature : Normal   Respiratory Status: WFL   Supplemental O2: None (Room air)   History of Recent Intubation: No  Behavior/Cognition: Alert; Cooperative Self-Feeding Abilities: Able to self-feed Baseline vocal quality/speech: Normal Volitional Cough: Unable to elicit Volitional Swallow: Able to elicit Exam Limitations: Limited visibility Goal Planning: Prognosis for improved oropharyngeal function: Good No data recorded No data recorded Patient/Family Stated Goal: patient is thirsty Consulted and agree with results and recommendations: Patient Pain: Pain Assessment Pain Assessment: No/denies pain Faces Pain Scale: 4 Pain Location: L sciatic pain in sitting on EOB Pain Descriptors / Indicators: Discomfort; Grimacing Pain Intervention(s): Limited activity within patient's tolerance; Monitored during session; Repositioned End of Session: Start Time:SLP Start Time (ACUTE ONLY): 1306 Stop Time: SLP Stop Time (ACUTE ONLY): 1329 Time Calculation:SLP Time Calculation (min) (ACUTE ONLY): 23 min Charges: SLP Evaluations $ SLP Speech Visit: 1 Visit SLP Evaluations $BSS Swallow: 1 Procedure $MBS Swallow: 1 Procedure $ SLP EVAL  LANGUAGE/SOUND PRODUCTION: 1 Procedure SLP visit diagnosis: SLP Visit Diagnosis: Dysphagia, oropharyngeal phase (R13.12) Past Medical History: Past Medical History: Diagnosis Date  Arthritis   DM2 (diabetes mellitus, type 2) (HCC)   Elevated LFTs   Erectile dysfunction   Gout 09/03/2011  RF colchicine    Hyperlipidemia 1990s  Hypertension 1980s  Hypogonadism male   Secondary hypogonadism,had a MRI, was prescribed testosterone by endocrinology  Sleep apnea   does't use cpap  Sudden hearing loss, left 2017  idiopathic sensorineural hearing loss Past Surgical History: Past Surgical History: Procedure Laterality Date  TOTAL KNEE ARTHROPLASTY Right 2005  TOTAL KNEE ARTHROPLASTY  12/04/2011  LEFT-- TOTAL KNEE ARTHROPLASTY;  Surgeon: Eugenia Mcalpine, MD;  Location: [facility];  Service: Orthopedics;  Laterality: Left; Carolan Shiver 06/22/2022, 2:53 PM Amanda L. Couture, MA CCC/SLP Clinical Specialist - Acute Care SLP Acute Rehabilitation Services Office number [phone]          Blood pressure (!) 160/79, pulse 74, temperature 98.5 F (36.9 C), temperature source Axillary, resp. rate 18, height 5\' 9"  (1.753 m), weight 99.8 kg, SpO2 98 %.   Medical Problem List and Plan: 1. 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 Large basal ganglia infarct. 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              -patient may  shower             -ELOS/Goals: 21-24 days             -Continue WHO and PRAFO             -Admit to CIR   2.  Antithrombotics: -DVT/anticoagulation:  Pharmaceutical: Lovenox             -antiplatelet therapy: Aspirin and Plavix for three months followed by aspirin alone (started 5/20)   3. Pain Management: Tylenol, Robaxin as needed   4. Mood/Behavior/Sleep: LCSW to evaluate and provide emotional support             -antipsychotic agents:  n/a             -trazodone as needed   5. Neuropsych/cognition: This patient is capable of making decisions on his own behalf.   6. Skin/Wound Care: Routine skin care checks   7. Fluids/Electrolytes/Nutrition: Routine Is and Os and follow-up chemistries             -  continue dys3/nectar thick; SLP eval   8: Hypertension: monitor TID and prn (home meds: amlodipine 10 mg QD, valsartan 320 mg daily)             -continue irbesartan 37.5 mg daily   9: Hyperlipidemia: continue high intensity statin, change simvastatin to atorvastatin 80mg  daily   10: Hyponatremia, mild Na 132: follow-up BMP   11: History of gout: restart home colchicine   12: DM type II: HGB A1C 6.7, CBGs QID and SSI; carb  modified diet when advanced (home meds include metformin 1000 mg BID)   13: RLE/RUE weakness: check RLE venous duplex   14: Tobacco use: occasional cigars, advise cessation   15. Constipation             -Miralax daily, sorbitol PRN started     I have personally performed a face to face diagnostic evaluation of this patient and formulated the key components of the plan.  Additionally, I have personally reviewed laboratory data, imaging studies, as well as relevant notes and concur with the physician assistant's documentation above.   The patient's status has not changed from the original H&P.  Any changes in documentation from the acute care chart have been noted above.   Fanny Dance, MD, FAAPMR     Milinda Antis, PA-C 06/24/2022

## 2022-06-24 NOTE — H&P (Signed)
Physical Medicine and Rehabilitation Admission H&P   CC: Functional deficits secondary to left MCA territory infarct due to adherent thrombus in the left MCA M1 segment resulting in high-grade stenosis   HPI: Jimmy Valentine is a 72 year old male who presented to [facility] ED on 06/21/2022 with sudden onset of right-sided weakness, dysarthria and facial drooping. Code stroke initiated. CT head showed questionable hyperdense left MCA m1 and a chronic lacunar infarct of the left corona radiata/basal ganglia. He was administered tenecteplase. 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 Carotid duplex showed bilateral stenosis 1-39%. MRI showed large MCA territory basal ganglia infarct with petechial hemorrhage with no hemorrhagic transformation or mass effect. Echo showed EF 65-70%, trivial MVR. Hgb A1c is 6.7%. LDL 91. Started for DAPT x 3 months for intracranial atherosclerosis, then aspirin monotherapy, change statin to atorvastatin and continue at discharge.   On 5/19, patient's bedside RN noted worsening right-sided weakness from her initial assessment (initially slight pronator drift, now minimal antigravity strength noted) and neurology was notified while on the unit at 08:30 AM. Assessed by Dr. Viviann Spare.   Patient's right-sided weakness noted to be fluctuating but patient was still weaker on the right than bedside RN's initial assessment.  Stat CT head order placed and did not show new acute changes.  Patient has right facial droop, withdrawals to pain on right leg. No right upper extremity movement. He is tolerating a D3 diet/nectar liquids.  Reports he feels constipated.   His PMH is significant for arthritis, ED, gout, HLD, HTN, sleep apnea, DM and idiopathic sensorineural hearing loss (left)   Niece and son at bedside. Has daughter who is supportive as well. He lives alone. Patient has not had regular BM since admission. Reports left sciatica with  mild symptoms currently. Brother in law can provide assistance after discharge.   Review of Systems  Constitutional:  Positive for fever. Negative for chills.  HENT:  Positive for hearing loss. Negative for sore throat.   Eyes:  Negative for blurred vision and double vision.  Respiratory:  Negative for cough and shortness of breath.   Cardiovascular:  Negative for chest pain and palpitations.  Gastrointestinal:  Positive for constipation. Negative for abdominal pain, nausea and vomiting.  Genitourinary:  Negative for dysuria and urgency.  Musculoskeletal:  Negative for neck pain.       Left sciatica>>mild symptoms currently  Skin:  Negative for rash.  Neurological:  Positive for focal weakness. Negative for dizziness, sensory change and headaches.  Psychiatric/Behavioral:  Negative for depression. The patient does not have insomnia.    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,had a MRI, was prescribed testosterone by endocrinology   Sleep apnea    does't use cpap   Sudden hearing loss, left 2017   idiopathic sensorineural hearing loss   Past Surgical History:  Procedure Laterality Date   TOTAL KNEE ARTHROPLASTY Right 2005   TOTAL KNEE ARTHROPLASTY  12/04/2011   LEFT-- TOTAL KNEE ARTHROPLASTY;  Surgeon: Eugenia Mcalpine, MD;  Location: [facility];  Service: Orthopedics;  Laterality: Left;   Family History  Problem Relation Age of Onset   Hypertension Mother    Coronary artery disease  Mother        M CABG at age 55s   Alzheimer's disease Mother    Diabetes Sister    Colon cancer Neg Hx    Prostate cancer Neg Hx    Stomach cancer Neg Hx    Rectal cancer Neg Hx    Social History:  reports that he quit smoking about 26 years ago. His smoking use included  cigarettes. He has a 15.00 pack-year smoking history. He has never used smokeless tobacco. He reports current alcohol use of about 12.0 standard drinks of alcohol per week. He reports that he does not use drugs. Allergies: No Known Allergies Medications Prior to Admission  Medication Sig Dispense Refill   amLODipine (NORVASC) 10 MG tablet TAKE 1 TABLET BY MOUTH EVERY DAY 90 tablet 1   metFORMIN (GLUCOPHAGE) 1000 MG tablet Take 1 tablet (1,000 mg total) by mouth 2 (two) times daily with a meal. 180 tablet 1   simvastatin (ZOCOR) 40 MG tablet TAKE 0.5 TABLETS (20 MG TOTAL) BY MOUTH AT BEDTIME. 45 tablet 1   valsartan (DIOVAN) 320 MG tablet Take 1 tablet (320 mg total) by mouth daily. 90 tablet 1   colchicine 0.6 MG tablet Take 1 tablet (0.6 mg total) by mouth 2 (two) times daily as needed. (Patient not taking: Reported on 06/21/2022) 60 tablet 6   sildenafil (REVATIO) 20 MG tablet TAKE 3 TO 4 TABLETS BY MOUTH EVERY DAY AS NEEDED (Patient taking differently: Take 60-80 mg by mouth daily as needed (as directed).) 270 tablet 0      Home: Home Living Family/patient expects to be discharged to:: Private residence Living Arrangements: Alone Available Help at Discharge: Family Type of Home: House Home Access: Stairs to enter Secretary/administrator of Steps: 4 Entrance Stairs-Rails: Left Home Layout: One level Bathroom Shower/Tub: Health visitor: Handicapped height Bathroom Accessibility: Yes Home Equipment: None Additional Comments: brother in law present and states can stay with him  Lives With: Alone   Functional History: Prior Function Prior Level of Function : Independent/Modified Independent, Driving, History of Falls (last six months) ADLs Comments: not working, but driving  Functional Status:  Mobility: Bed Mobility Overal bed mobility: Needs Assistance Bed Mobility: Rolling, Supine to Sit, Sit to Supine Rolling: Max assist Supine to sit: Max assist, +2 for  physical assistance Sit to supine: Max assist, +2 for physical assistance General bed mobility comments: Rolling towards left to remove bedpan with maxA. Exiting towards right side of bed to sit up, cues for use of rail, assist at RLE and trunk to execute Transfers Overall transfer level: Needs assistance Equipment used: Ambulation equipment used Transfers: Sit to/from Stand Sit to Stand: Mod assist, +2 physical assistance Bed to/from chair/wheelchair/BSC transfer type:: Squat pivot Squat pivot transfers: Max assist, +2 physical assistance General transfer comment: Pt pulling up with LUE on Stedy to power up; assist for foot placement to widen BOS. Visual/verbal/tactile cueing for midline      ADL: ADL Overall ADL's : Needs assistance/impaired Grooming: Minimal assistance, Sitting Upper Body Dressing : Maximal assistance, Sitting Lower Body Dressing: Total assistance, +2 for physical assistance, +2 for safety/equipment, Sit to/from stand, Sitting/lateral leans Toilet Transfer: Maximal assistance, +2 for physical assistance, +2 for safety/equipment, Stand-pivot Functional mobility during ADLs: Maximal assistance, +2 for physical assistance, +2 for safety/equipment  Cognition: Cognition Overall Cognitive Status: Impaired/Different from baseline Arousal/Alertness: Awake/alert Orientation Level: Oriented X4 Year: 2024 Month: May Day of Week: Correct Attention: Sustained Sustained Attention: Impaired Sustained Attention Impairment: Verbal  complex Awareness: Impaired Awareness Impairment: Emergent impairment Safety/Judgment: Impaired Cognition Arousal/Alertness: Awake/alert Behavior During Therapy: WFL for tasks assessed/performed Overall Cognitive Status: Impaired/Different from baseline Area of Impairment: Attention, Safety/judgement, Problem solving, Awareness Current Attention Level: Sustained Safety/Judgement: Decreased awareness of deficits, Decreased awareness of  safety Awareness: Emergent Problem Solving: Slow processing, Difficulty sequencing, Requires tactile cues, Requires verbal cues General Comments: continues with R inattention, however, benefits from visual cueing. Decreased carryover  Physical Exam: Blood pressure (!) 160/79, pulse 74, temperature 98.5 F (36.9 C), temperature source Axillary, resp. rate 18, height 5\' 9"  (1.753 m), weight 99.8 kg, SpO2 98 %.       General: No apparent distress, obese HEENT: Head is normocephalic, atraumatic, PERRLA, EOMI, sclera anicteric, oral mucosa pink and moist, dentition intact, ext ear canals clear,  Neck: Supple without JVD or lymphadenopathy Heart: Reg rate and rhythm. No murmurs rubs or gallops Chest: CTA bilaterally without wheezes, rales, or rhonchi; no distress Abdomen: Soft, non-tender, non-distended, bowel sounds positive. Extremities: No clubbing, cyanosis, or edema. Pulses are 2+ Psych: A little flat. Pt is cooperative Skin: Clean and intact without signs of breakdown Neuro:.  Alert and oriented x 3, follows commands, visual fields are full, PERRLA, EOMI, right facial droop noted, hearing intact, shoulder shrug is decreased on the right, tongue protrudes to the right Able to name 4/5 items and able to repeat, short-term memory -able to recall 0 out of 3 items Patient has dysarthria, also noted mild aphasia expressive more than receptive Finger-nose intact on the left Dense right hemiplegia, 0 out of 5 strength throughout Left strength 5 out of 5 bilateral upper extremities and lower extremities Sensation intact light touch in all 4 extremities No ankle clonus bilaterally Musculoskeletal:  Evidence of bilateral TKA healed incisions Increased tone at right shoulder and elbow flexors and extensors Mild hypertonia right finger flexors and wrist flexors,  mild to moderate at right knee extensors, right ankle plantar flexors 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  Glucose, capillary     Status: Abnormal   Collection Time: 06/22/22  5:35 PM  Result Value Ref Range   Glucose-Capillary 198 (H) 70 - 99 mg/dL    Comment: Glucose reference range applies only to samples taken after fasting for at least 8 hours.  Glucose, capillary     Status: Abnormal   Collection Time: 06/22/22 10:10 PM  Result Value Ref Range   Glucose-Capillary 141 (H) 70 - 99 mg/dL    Comment: Glucose reference range applies only to samples taken after fasting for at least 8 hours.  Glucose, capillary     Status: Abnormal   Collection Time: 06/23/22  6:20 AM  Result Value Ref Range   Glucose-Capillary 165 (H) 70 - 99 mg/dL    Comment: Glucose reference range applies only to samples taken after fasting for at least 8 hours.   Comment 1 Notify RN    Comment 2 Document in Chart   Glucose, capillary  Status: Abnormal   Collection Time: 06/23/22 11:03 AM  Result Value Ref Range   Glucose-Capillary 215 (H) 70 - 99 mg/dL    Comment: Glucose reference range applies only to samples taken after fasting for at least 8 hours.  Glucose, capillary     Status: Abnormal   Collection Time: 06/23/22  4:43 PM  Result Value Ref Range   Glucose-Capillary 195 (H) 70 - 99 mg/dL    Comment: Glucose reference range applies only to samples taken after fasting for at least 8 hours.  Glucose, capillary     Status: Abnormal   Collection Time: 06/23/22  9:04 PM  Result Value Ref Range   Glucose-Capillary 155 (H) 70 - 99 mg/dL    Comment: Glucose  reference range applies only to samples taken after fasting for at least 8 hours.   Comment 1 Notify RN    Comment 2 Document in Chart   Glucose, capillary     Status: Abnormal   Collection Time: 06/24/22  3:46 AM  Result Value Ref Range   Glucose-Capillary 159 (H) 70 - 99 mg/dL    Comment: Glucose reference range applies only to samples taken after fasting for at least 8 hours.  Glucose, capillary     Status: Abnormal   Collection Time: 06/24/22  6:35 AM  Result Value Ref Range   Glucose-Capillary 161 (H) 70 - 99 mg/dL    Comment: Glucose reference range applies only to samples taken after fasting for at least 8 hours.   DG Swallowing Func-Speech Pathology  Result Date: 06/22/2022 Table formatting from the original result was not included. Modified Barium Swallow Study Patient Details Name: ARNEY DRACE MRN: [id number] Date of Birth: [birth year] Today's Date: 06/22/2022 HPI/PMH: HPI: Patient is a 72 y.o. male with PMH: arthritis, ED, gout, HLD, HTN, sleep apnea, DM, idiopathic sensorineural hearing loss (left). He presented to the ED on 06/21/22 via EMS from home for code stroke after acute onset of right sided weakness. En route to ED, patient spontaneously recovered completely with all symptoms but while still en route, his symptoms recurred. CT head was negative for acute intracranial hemorrhage. He received TNK. MRI brain ordered but not completed at time of SLP evaluation. Neurology reporting that patient's overall presentation is consistent with aute ischemic stroke within the left MCA territory. Clinical Impression: Clinical Impression: Pt presented with a primary oral dysphagia marked by decreased oral control and sensation, with large portions of barium escaping anteriorly from right side of oral cavity, intermittently without awareness.  Otherwise, pt demonstrated functional pharyngeal squeeze, trace residue in valleculae post-swallow, generally reliable laryngeal vestibule closure. There  was a single incident of very trace aspiration of thin liquids when pt was following solids immediately with liquids.  A pill transferred readily through the esophagus.  It was difficult to view the proximal esophagus/PES due to obstruction by shoulder, but it is presumed flow from pharynx to esophagus was functional.  Recommend continuing a dysphagia 3 diet with nectar thick liquids for now. SLP will f/u to teach swallowing strategies to manage oral control and bolus release to pharynx. Anticipate progression to thin liquids in the next few days - pt will not need a repeat MBS> Factors that may increase risk of adverse event in presence of aspiration Rubye Oaks & Clearance Coots 2021): No data recorded Recommendations/Plan: Swallowing Evaluation Recommendations Swallowing Evaluation Recommendations Recommendations: PO diet PO Diet Recommendation: Dysphagia 3 (Mechanical soft); Mildly thick liquids (Level 2, nectar thick) Liquid Administration via: Cup; Straw Medication Administration:  Whole meds with liquid (whole pills with nectar) Supervision: Full assist for feeding Postural changes: Position pt fully upright for meals Oral care recommendations: Oral care BID (2x/day) Treatment Plan Treatment Plan Treatment recommendations: Therapy as outlined in treatment plan below Functional status assessment: Patient has had a recent decline in their functional status and demonstrates the ability to make significant improvements in function in a reasonable and predictable amount of time. Treatment frequency: Min 2x/week Treatment duration: 2 weeks Recommendations Recommendations for follow up therapy are one component of a multi-disciplinary discharge planning process, led by the attending physician.  Recommendations may be updated based on patient status, additional functional criteria and insurance authorization. Assessment: Orofacial Exam: Orofacial Exam Oral Cavity - Dentition: Adequate natural dentition Orofacial Anatomy: WFL Oral  Motor/Sensory Function: Suspected cranial nerve impairment CN V - Trigeminal: Not tested CN VII - Facial: Right motor impairment CN XII - Hypoglossal: WFL Anatomy: Anatomy: WFL Boluses Administered: Boluses Administered Boluses Administered: Thin liquids (Level 0); Mildly thick liquids (Level 2, nectar thick); Moderately thick liquids (Level 3, honey thick); Puree; Solid  Oral Impairment Domain: Oral Impairment Domain Lip Closure: Escape beyond mid-chin Tongue control during bolus hold: Escape to lateral buccal cavity/floor of mouth Bolus preparation/mastication: Slow prolonged chewing/mashing with complete recollection Bolus transport/lingual motion: Brisk tongue motion Oral residue: Trace residue lining oral structures Location of oral residue : Tongue Initiation of pharyngeal swallow : Pyriform sinuses  Pharyngeal Impairment Domain: Pharyngeal Impairment Domain Soft palate elevation: No bolus between soft palate (SP)/pharyngeal wall (PW) Laryngeal elevation: Complete superior movement of thyroid cartilage with complete approximation of arytenoids to epiglottic petiole Anterior hyoid excursion: Complete anterior movement Epiglottic movement: Complete inversion Laryngeal vestibule closure: Incomplete, narrow column air/contrast in laryngeal vestibule Pharyngeal stripping wave : Present - diminished Pharyngeal contraction (A/P view only): N/A Pharyngoesophageal segment opening: -- (difficult to visualize due to obstruction by shoulder) Tongue base retraction: Trace column of contrast or air between tongue base and PPW Pharyngeal residue: Trace residue within or on pharyngeal structures Location of pharyngeal residue: Valleculae  Esophageal Impairment Domain: Esophageal Impairment Domain Esophageal clearance upright position: Complete clearance, esophageal coating Pill: Esophageal Impairment Domain Esophageal clearance upright position: Complete clearance, esophageal coating Penetration/Aspiration Scale Score:  Penetration/Aspiration Scale Score 1.  Material does not enter airway: Mildly thick liquids (Level 2, nectar thick); Moderately thick liquids (Level 3, honey thick); Puree; Solid; Pill 7.  Material enters airway, passes BELOW cords and not ejected out despite cough attempt by patient: Thin liquids (Level 0) Compensatory Strategies: Compensatory Strategies Compensatory strategies: Yes Straw: Effective Effective Straw: Thin liquid (Level 0); Mildly thick liquid (Level 2, nectar thick)   General Information: Caregiver present: No  Diet Prior to this Study: Dysphagia 3 (mechanical soft); Mildly thick liquids (Level 2, nectar thick)   Temperature : Normal   Respiratory Status: WFL   Supplemental O2: None (Room air)   History of Recent Intubation: No  Behavior/Cognition: Alert; Cooperative Self-Feeding Abilities: Able to self-feed Baseline vocal quality/speech: Normal Volitional Cough: Unable to elicit Volitional Swallow: Able to elicit Exam Limitations: Limited visibility Goal Planning: Prognosis for improved oropharyngeal function: Good No data recorded No data recorded Patient/Family Stated Goal: patient is thirsty Consulted and agree with results and recommendations: Patient Pain: Pain Assessment Pain Assessment: No/denies pain Faces Pain Scale: 4 Pain Location: L sciatic pain in sitting on EOB Pain Descriptors / Indicators: Discomfort; Grimacing Pain Intervention(s): Limited activity within patient's tolerance; Monitored during session; Repositioned End of Session: Start Time:SLP Start Time (ACUTE ONLY):  1306 Stop Time: SLP Stop Time (ACUTE ONLY): 1329 Time Calculation:SLP Time Calculation (min) (ACUTE ONLY): 23 min Charges: SLP Evaluations $ SLP Speech Visit: 1 Visit SLP Evaluations $BSS Swallow: 1 Procedure $MBS Swallow: 1 Procedure $ SLP EVAL LANGUAGE/SOUND PRODUCTION: 1 Procedure SLP visit diagnosis: SLP Visit Diagnosis: Dysphagia, oropharyngeal phase (R13.12) Past Medical History: Past Medical History: Diagnosis  Date  Arthritis   DM2 (diabetes mellitus, type 2) (HCC)   Elevated LFTs   Erectile dysfunction   Gout 09/03/2011  RF colchicine    Hyperlipidemia 1990s  Hypertension 1980s  Hypogonadism male   Secondary hypogonadism,had a MRI, was prescribed testosterone by endocrinology  Sleep apnea   does't use cpap  Sudden hearing loss, left 2017  idiopathic sensorineural hearing loss Past Surgical History: Past Surgical History: Procedure Laterality Date  TOTAL KNEE ARTHROPLASTY Right 2005  TOTAL KNEE ARTHROPLASTY  12/04/2011  LEFT-- TOTAL KNEE ARTHROPLASTY;  Surgeon: Eugenia Mcalpine, MD;  Location: [facility];  Service: Orthopedics;  Laterality: Left; Carolan Shiver 06/22/2022, 2:53 PM Amanda L. Couture, MA CCC/SLP Clinical Specialist - Acute Care SLP Acute Rehabilitation Services Office number [phone]     Blood pressure (!) 160/79, pulse 74, temperature 98.5 F (36.9 C), temperature source Axillary, resp. rate 18, height 5\' 9"  (1.753 m), weight 99.8 kg, SpO2 98 %.  Medical Problem List and Plan: 1. 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 Large basal ganglia infarct. 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   -patient may  shower  -ELOS/Goals: 21-24 days  -Continue WHO and PRAFO  -Admit to CIR  2.  Antithrombotics: -DVT/anticoagulation:  Pharmaceutical: Lovenox  -antiplatelet therapy: Aspirin and Plavix for three months followed by aspirin alone (started 5/20)  3. Pain Management: Tylenol, Robaxin as needed  4. Mood/Behavior/Sleep: LCSW to evaluate and provide emotional support  -antipsychotic agents: n/a  -trazodone as needed  5. Neuropsych/cognition: This patient is capable of making decisions on his own behalf.  6. Skin/Wound Care: Routine skin care checks   7. Fluids/Electrolytes/Nutrition: Routine Is and Os and follow-up  chemistries  -continue dys3/nectar thick; SLP eval  8: Hypertension: monitor TID and prn (home meds: amlodipine 10 mg QD, valsartan 320 mg daily)  -continue irbesartan 37.5 mg daily  9: Hyperlipidemia: continue high intensity statin, change simvastatin to atorvastatin 80mg  daily  10: Hyponatremia, mild Na 132: follow-up BMP  11: History of gout: restart home colchicine  12: DM type II: HGB A1C 6.7, CBGs QID and SSI; carb  modified diet when advanced (home meds include metformin 1000 mg BID)  13: RLE/RUE weakness: check RLE venous duplex  14: Tobacco use: occasional cigars, advise cessation  15. Constipation  -Miralax daily, sorbitol PRN started   I have personally performed a face to face diagnostic evaluation of this patient and formulated the key components of the plan.  Additionally, I have personally reviewed laboratory data, imaging studies, as well as relevant notes and concur with the physician assistant's documentation above.  The patient's status has not changed from the original H&P.  Any changes in documentation from the acute care chart have been noted above.  Fanny Dance, MD, FAAPMR   Milinda Antis, PA-C 06/24/2022

## 2022-06-25 ENCOUNTER — Inpatient Hospital Stay (HOSPITAL_COMMUNITY): Payer: Medicare HMO

## 2022-06-25 DIAGNOSIS — I63512 Cerebral infarction due to unspecified occlusion or stenosis of left middle cerebral artery: Secondary | ICD-10-CM | POA: Diagnosis not present

## 2022-06-25 DIAGNOSIS — M7989 Other specified soft tissue disorders: Secondary | ICD-10-CM | POA: Diagnosis not present

## 2022-06-25 LAB — COMPREHENSIVE METABOLIC PANEL
ALT: 27 U/L (ref 0–44)
AST: 18 U/L (ref 15–41)
Albumin: 3.2 g/dL — ABNORMAL LOW (ref 3.5–5.0)
Alkaline Phosphatase: 66 U/L (ref 38–126)
Anion gap: 12 (ref 5–15)
BUN: 17 mg/dL (ref 8–23)
CO2: 21 mmol/L — ABNORMAL LOW (ref 22–32)
Calcium: 8.8 mg/dL — ABNORMAL LOW (ref 8.9–10.3)
Chloride: 95 mmol/L — ABNORMAL LOW (ref 98–111)
Creatinine, Ser: 1.02 mg/dL (ref 0.61–1.24)
GFR, Estimated: >60 mL/min (ref >60)
Glucose, Bld: 151 mg/dL — ABNORMAL HIGH (ref 70–99)
Potassium: 4.2 mmol/L (ref 3.5–5.1)
Sodium: 128 mmol/L — ABNORMAL LOW (ref 135–145)
Total Bilirubin: 1.7 mg/dL — ABNORMAL HIGH (ref 0.3–1.2)
Total Protein: 6.5 g/dL (ref 6.5–8.1)

## 2022-06-25 LAB — CBC WITH DIFFERENTIAL/PLATELET
Abs Immature Granulocytes: 0.05 10*3/uL (ref 0.00–0.07)
Basophils Absolute: 0 10*3/uL (ref 0.0–0.1)
Basophils Relative: 0 %
Eosinophils Absolute: 0.2 10*3/uL (ref 0.0–0.5)
Eosinophils Relative: 2 %
HCT: 44.8 % (ref 39.0–52.0)
Hemoglobin: 16.4 g/dL (ref 13.0–17.0)
Immature Granulocytes: 1 %
Lymphocytes Relative: 25 %
Lymphs Abs: 2.2 10*3/uL (ref 0.7–4.0)
MCH: 32.5 pg (ref 26.0–34.0)
MCHC: 36.6 g/dL — ABNORMAL HIGH (ref 30.0–36.0)
MCV: 88.7 fL (ref 80.0–100.0)
Monocytes Absolute: 1.2 10*3/uL — ABNORMAL HIGH (ref 0.1–1.0)
Monocytes Relative: 13 %
Neutro Abs: 5.1 10*3/uL (ref 1.7–7.7)
Neutrophils Relative %: 59 %
Platelets: 215 10*3/uL (ref 150–400)
RBC: 5.05 MIL/uL (ref 4.22–5.81)
RDW: 12.3 % (ref 11.5–15.5)
WBC: 8.7 10*3/uL (ref 4.0–10.5)
nRBC: 0 % (ref 0.0–0.2)

## 2022-06-25 LAB — GLUCOSE, CAPILLARY
Glucose-Capillary: 170 mg/dL — ABNORMAL HIGH (ref 70–99)
Glucose-Capillary: 180 mg/dL — ABNORMAL HIGH (ref 70–99)
Glucose-Capillary: 144 mg/dL — ABNORMAL HIGH (ref 70–99)
Glucose-Capillary: 197 mg/dL — ABNORMAL HIGH (ref 70–99)

## 2022-06-25 MED ORDER — TRAZODONE HCL 50 MG PO TABS
50.0000 mg | ORAL_TABLET | Freq: Every evening | ORAL | Status: DC | PRN
  Administered 2022-06-25 – 2022-07-29 (×25): 50 mg via ORAL
  Filled 2022-06-25 (×25): qty 1

## 2022-06-25 NOTE — Progress Notes (Signed)
Patient states he has had one small BM since he arrieved. PRN Sorbitol given as ordered. Encouraged fluids

## 2022-06-25 NOTE — Progress Notes (Addendum)
Inpatient Rehabilitation Admission Medication Review by a Pharmacist  A complete drug regimen review was completed for this patient to identify any potential clinically significant medication issues.  High Risk Drug Classes Is patient taking? Indication by Medication  Antipsychotic No   Anticoagulant Yes Enoxaparin - VTE prophylaxis  Antibiotic No   Opioid No   Antiplatelet Yes Aspirin 81, clopidogrel - CVA  Hypoglycemics/insulin Yes SSI - glucose control  Vasoactive Medication Yes Amlodipine, irbesartan - blood pressure  Chemotherapy No   Other Yes Atorvastatin - hyperlipidemia Pantoprazole - GI prophylaxis Miralax - laxative  PRNs: Acetaminophen - mild pain Maalox - indigestion Colchicine - gout flare Guaifenesin/dextromethorphan - cough Methocarbamol - muscle spasms Ondansetron - nausea/vomiting Sorbitol - laxative Trazodone - sleep     Type of Medication Issue Identified Description of Issue Recommendation(s)  Drug Interaction(s) (clinically significant)     Duplicate Therapy     Allergy     No Medication Administration End Date  Aspirin and Clopidogrel planned for 3 months, then Aspirin alone. Begun 5/20. Indicate 3 month stop date for Clopidogrel with discharge.  Incorrect Dose     Additional Drug Therapy Needed     Significant med changes from prior encounter (inform family/care partners about these prior to discharge). Simvastatin > Atorvastatin due to LDL above goal.  On irbesartan as hospital substitution for valsartan; currently much lower dosage equivalent.   Off Metformin  Off HCTZ, also off while inpatient. Communicate changes to patient/family at discharge.  Keeping BPs on higher side for now. - on irbesartan 37.5 mg, which would be equivalent to 40 mg valsartan (was taking 300 mg prior to admit).  Now SSI  Holding for BPs on higher side for now and low sodium.  Other       Clinically significant medication issues were identified that  warrant physician communication and completion of prescribed/recommended actions by midnight of the next day:  No  Pharmacist comments:  - Aspirin and Clopidogrel planned x 3 months, then aspirin alone. Begun 06/22/22  Time spent performing this drug regimen review (minutes):  [address]   Nicolette Bang [place], [place] 06/25/2022 10:32 AM

## 2022-06-25 NOTE — Progress Notes (Signed)
Physical Therapy Session Note  Patient Details  Name: Jimmy Valentine MRN: [id number] Date of Birth: 10/31/1950  Today's Date: 06/25/2022 PT Individual Time: 4098-1191 PT Individual Time Calculation (min): 85 min   Short Term Goals: Week 1:  PT Short Term Goal 1 (Week 1): Pt will complete bed mobility with modA PT Short Term Goal 2 (Week 1): Pt will complete bed<>chair transfers with maxA of 1 person PT Short Term Goal 3 (Week 1): Pt will maintain unsupported sitting balance for 30 seconds PT Short Term Goal 4 (Week 1): Pt will participate in functional outcome measure to assess falls risk  Skilled Therapeutic Interventions/Progress Updates: Patient supine in room with family on entrance to room. Patient alert and agreeable to PT session.   Patient reported no pain at beginning of PT session. Patient demonstrated cue comprehension throughout session, but occasionally required direct cues with tactile cues to understand per presentation of attempting to stand in max stedy when cued to sit up ("sit up with your trunk" while PTA points to chest worked well). Patient performed mat interventions in minimally distractive environment.   Therapeutic Activity: Bed Mobility: Pt performed supine to R sidelying with heavy modA (L UE HHA) and instructional cues provided for patient to flex R knee. Patient maxA to get to sitting on EOB, and totalA to advance R LE.  Transfers: Pt performed sit<>stand to max stedy from EOB (slightly elevated to get stedy legs underneath bed) with heavy modA and instructional cues to use L UE to pull self up via max stedy bar. Patient required total A to adjust R LE onto stedy (R LE tends to go into inversion with external rotation at the hips. PTA provided approximation at R knee during stand). Patient requires maxA to control eccentric. Patient performed sit to stand from hi/low mat at lowest height with modA. Patient presented with R lateral lean with self correction at times,  and correction after instructional cues.  - Patient requested to use the restroom to attempt BM. Attending Nurse alerted 2/2 patient requiring close supervision but there was no one available to assist. PTA assisted patient via max stedy in patient bathroom. Patient required maxA to control eccentric sit, and to stand from commode (BSC over toilet) and cues to use L UE for postural control while PTA dependently donned brief and provided posterior pericare (attending NT notified that no BM occurred, but that patient's brief was soiled with urine). Patient maxA to donn personal pants by helping to pull personal pants past mid thigh. Patient then transported back to bed and was mod-maxA to control descending to EOB. Patient then mod-maxA to get to R siedlying and was provided with multimodal cues to use L UE to catch self on bed while descending. Patient then required to be adjusted to [facility]. Patient instructed to use L LE to push self up by pushing into bed with help of L UE on HOB rail (PTA provided modA via chuck).  Neuromuscular Re-ed: NMR facilitated during session with focus on postural control, static sitting balance, and dynamic sitting balance. - Patient sitting edge of mat with feet unsupported for 30 seconds and demonstrated ability to adjust posture when using mirror for feedback (R posterior lean bias). Patient performed forward reaching in multiple planes and demonstrated righting reaction to keep posture close to center. Patient demonstrated core muscle contraction on R obliques by demonstrating ability to pull self back to center from leaning laterally to the left during reaching activities.  - Patient performed  sitting edge of mat to supine on hi/low mat. Patient required mod-maxA to control descent to R sidelying, and heavy minA to get to supine. Patient then with L HHA to pull to R sidelying with maxA provided (cues to bend R knee). Patient also required totalA to advance R LE off mat, and maxA  to get to sitting edge of mat.   NMR performed for improvements in motor control and coordination, balance, sequencing, judgement, and self confidence/ efficacy in performing all aspects of mobility at highest level of independence.   Patient left supine in bed at end of session with brakes locked, bed alarm set, and all needs within reach.      Therapy Documentation Precautions:  Precautions Precautions: Fall Precaution Comments: R hemi; dysarthria Restrictions Weight Bearing Restrictions: No  Therapy/Group: Individual Therapy  Colisha Redler PTA 06/25/2022, 3:57 PM

## 2022-06-25 NOTE — Evaluation (Signed)
Physical Therapy Assessment and Plan  Patient Details  Name: Jimmy Valentine MRN: [id number] Date of Birth: Sep 02, 1950  PT Diagnosis: Abnormal posture, Abnormality of gait, Difficulty walking, Hemiplegia dominant, Hypotonia, Impaired sensation, and Muscle weakness Rehab Potential: Good ELOS: 4 weeks   Today's Date: 06/25/2022 PT Individual Time: 0845-1000 PT Individual Time Calculation (min): 75 min    Hospital Problem: Principal Problem:   Acute ischemic left MCA stroke [facility])   Past Medical History:  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,had a MRI, was prescribed testosterone by endocrinology   Sleep apnea    does't use cpap   Sudden hearing loss, left 2017   idiopathic sensorineural hearing loss   Past Surgical History:  Past Surgical History:  Procedure Laterality Date   TOTAL KNEE ARTHROPLASTY Right 2005   TOTAL KNEE ARTHROPLASTY  12/04/2011   LEFT-- TOTAL KNEE ARTHROPLASTY;  Surgeon: Eugenia Mcalpine, MD;  Location: [facility]S;  Service: Orthopedics;  Laterality: Left;    Assessment & Plan Clinical Impression: Patient is a  72 year old male who presented to [facility] ED on 06/21/2022 with sudden onset of right-sided weakness, dysarthria and facial drooping. Code stroke initiated. CT head showed questionable hyperdense left MCA m1 and a chronic lacunar infarct of the left corona radiata/basal ganglia. He was administered tenecteplase. 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 Carotid duplex showed bilateral stenosis 1-39%. MRI showed large MCA territory basal ganglia infarct with petechial hemorrhage with no hemorrhagic transformation or mass effect. Echo showed EF 65-70%, trivial MVR. Hgb A1c is 6.7%. LDL  91. Started for DAPT x 3 months for intracranial atherosclerosis, then aspirin monotherapy, change statin to atorvastatin and continue at discharge.    On 5/19, patient's bedside RN noted worsening right-sided weakness from her initial assessment (initially slight pronator drift, now minimal antigravity strength noted) and neurology was notified while on the unit at 08:30 AM. Assessed by Dr. Viviann Spare.   Patient's right-sided weakness noted to be fluctuating but patient was still weaker on the right than bedside RN's initial assessment.  Stat CT head order placed and did not show new acute changes.  Patient has right facial droop, withdrawals to pain on right leg. No right upper extremity movement. He is tolerating a D3 diet/nectar liquids.  Reports he feels constipated.    His PMH is significant for arthritis, ED, gout, HLD, HTN, sleep apnea, DM and idiopathic sensorineural hearing loss (left)    Niece and son at bedside. Has daughter who is supportive as well. He lives alone. Patient has not had regular BM since admission. Reports left sciatica with mild symptoms currently. Brother in law can provide assistance after discharge.  Patient transferred to [facility] on 06/24/2022 .   Patient currently requires total with mobility secondary to muscle weakness and muscle joint tightness, decreased cardiorespiratoy endurance, abnormal tone, unbalanced muscle activation, and motor apraxia, and decreased sitting balance, decreased standing balance, decreased postural control, hemiplegia, and decreased balance strategies.  Prior to hospitalization, patient was independent  with mobility and lived with Alone in a House home.  Home access is 4Stairs to enter.  Patient will benefit from skilled PT intervention to maximize safe functional mobility,  minimize fall risk, and decrease caregiver burden for planned discharge home with 24 hour assist.  Anticipate patient will benefit from follow up [facility] at discharge.  PT - End of  Session Activity Tolerance: Tolerates 10 - 20 min activity with multiple rests Endurance Deficit: Yes PT Assessment Rehab Potential (ACUTE/IP ONLY): Good PT Barriers to Discharge: Decreased caregiver support;Home environment access/layout;Lack of/limited family support;Insurance for SNF coverage;Weight;Nutrition means PT Patient demonstrates impairments in the following area(s): Balance;Endurance;Motor;Nutrition;Safety;Skin Integrity PT Transfers Functional Problem(s): Bed Mobility;Bed to Chair;Car PT Locomotion Functional Problem(s): Ambulation;Stairs PT Plan PT Intensity: Minimum of 1-2 x/day ,45 to 90 minutes PT Frequency: 5 out of 7 days PT Duration Estimated Length of Stay: 4 weeks PT Treatment/Interventions: Ambulation/gait training;Balance/vestibular training;Cognitive remediation/compensation;Community reintegration;Discharge planning;Disease management/prevention;DME/adaptive equipment instruction;Functional electrical stimulation;Functional mobility training;Neuromuscular re-education;Pain management;Patient/family education;Psychosocial support;Skin care/wound management;Splinting/orthotics;Stair training;Therapeutic Activities;Therapeutic Exercise;UE/LE Strength taining/ROM;UE/LE Coordination activities;Visual/perceptual remediation/compensation;Wheelchair propulsion/positioning PT Transfers Anticipated Outcome(s): minA PT Locomotion Anticipated Outcome(s): minA but likely wheelchair level PT Recommendation Recommendations for Other Services: Neuropsych consult Follow Up Recommendations: Home health PT;24 hour supervision/assistance Patient destination: Home Equipment Recommended: To be determined   PT Evaluation Precautions/Restrictions Precautions Precautions: Fall Precaution Comments: R hemi; dysarthria Restrictions Weight Bearing Restrictions: No Pain Interference Pain Interference Pain Effect on Sleep: 1. Rarely or not at all Pain Interference with Therapy  Activities: 1. Rarely or not at all Pain Interference with Day-to-Day Activities: 2. Occasionally Home Living/Prior Functioning Home Living Available Help at Discharge: Family;Other (Comment);Neighbor;Available PRN/intermittently (son lives 20 miles, daughter lives in [place]. Has a neighbor that he plans on hiring for ADLs and assistance at home) Type of Home: House Home Access: Stairs to enter [employer] of Steps: 4 Entrance Stairs-Rails: Left Home Layout: One level Bathroom Shower/Tub: Walk-in shower  Lives With: Alone Prior Function Level of Independence: Independent with basic ADLs;Independent with homemaking with ambulation;Independent with gait  Able to Take Stairs?: Yes Driving: Yes Vocation: Retired Research scientist (physical sciences)) Vision/Perception  Vision - History Ability to See in Adequate Light: 0 Adequate Perception Perception: Impaired Inattention/Neglect: Does not attend to right side of body Praxis Praxis: Impaired Praxis Impairment Details: Motor planning;Initiation  Cognition Overall Cognitive Status: Impaired/Different from baseline Arousal/Alertness: Awake/alert Orientation Level: Oriented to place;Oriented to situation;Oriented to person;Disoriented to time Year: 2024 Month: May Day of Week: Correct Attention: Focused;Sustained;Selective Focused Attention: Appears intact Sustained Attention: Appears intact Selective Attention: Impaired Selective Attention Impairment: Verbal basic;Functional basic Awareness: Impaired Awareness Impairment: Emergent impairment Problem Solving: Impaired Problem Solving Impairment: Verbal basic;Functional basic Safety/Judgment: Appears intact Comments: Will continue to assess in functional context Sensation Sensation Light Touch: Appears Intact Hot/Cold: Appears Intact Proprioception: Impaired Detail Proprioception Impaired Details: Impaired RUE;Impaired RLE Stereognosis: Not tested Coordination Gross Motor Movements are Fluid and  Coordinated: No Coordination and Movement Description: Dense R sided weakness; hypertonia Heel Shin Test: UTA on R due to weakness Motor  Motor Motor: Hemiplegia;Abnormal tone;Motor apraxia;Abnormal postural alignment and control   Trunk/Postural Assessment  Cervical Assessment Cervical Assessment: Within Functional Limits Thoracic Assessment Thoracic Assessment: Exceptions to [facility] (rounded shoulders) Lumbar Assessment Lumbar Assessment: Exceptions to [facility] (posterior tilt) Postural Control Postural Control: Deficits on evaluation Trunk Control: R trunk lean, posterior bias Righting Reactions: delayed and insufficient  Balance Balance Balance Assessed: Yes Static Sitting Balance Static Sitting - Balance Support: Feet supported;No upper extremity supported Static Sitting - Level of Assistance: 4: Min assist Dynamic Sitting Balance Dynamic Sitting - Balance Support: Feet supported;No upper extremity supported;During functional activity Dynamic Sitting - Level of Assistance: 3: Mod assist Static Standing Balance Static  Standing - Balance Support: Bilateral upper extremity supported Static Standing - Level of Assistance: 2: Max assist Dynamic Standing Balance Dynamic Standing - Balance Support: Bilateral upper extremity supported Dynamic Standing - Level of Assistance: 1: +2 Total assist Extremity Assessment      RLE Assessment RLE Assessment: Exceptions to [facility] General Strength Comments: No active voluntary movement on command. Possibly quad activation in weight bearing but difficult to discern LLE Assessment LLE Assessment: Within Functional Limits  Care Tool Care Tool Bed Mobility Roll left and right activity   Roll left and right assist level: Total Assistance - Patient < 25%    Sit to lying activity   Sit to lying assist level: Maximal Assistance - Patient 25 - 49%    Lying to sitting on side of bed activity   Lying to sitting on side of bed assist level: the ability  to move from lying on the back to sitting on the side of the bed with no back support.: Total Assistance - Patient < 25%     Care Tool Transfers Sit to stand transfer   Sit to stand assist level: 2 Helpers    Chair/bed transfer   Chair/bed transfer assist level: 2 Warehouse manager transfer activity did not occur: Safety/medical concerns        Care Tool Locomotion Ambulation Ambulation activity did not occur: Safety/medical concerns        Walk 10 feet activity Walk 10 feet activity did not occur: Safety/medical concerns       Walk 50 feet with 2 turns activity Walk 50 feet with 2 turns activity did not occur: Safety/medical concerns      Walk 150 feet activity Walk 150 feet activity did not occur: Safety/medical concerns      Walk 10 feet on uneven surfaces activity Walk 10 feet on uneven surfaces activity did not occur: Safety/medical concerns      Stairs Stair activity did not occur: Safety/medical concerns        Walk up/down 1 step activity Walk up/down 1 step or curb (drop down) activity did not occur: Safety/medical concerns      Walk up/down 4 steps activity Walk up/down 4 steps activity did not occur: Safety/medical concerns      Walk up/down 12 steps activity Walk up/down 12 steps activity did not occur: Safety/medical concerns      Pick up small objects from floor Pick up small object from the floor (from standing position) activity did not occur: Safety/medical concerns      Wheelchair Is the patient using a wheelchair?: Yes Type of Wheelchair: Manual   Wheelchair assist level: Dependent - Patient 0%    Wheel 50 feet with 2 turns activity   Assist Level: Dependent - Patient 0%  Wheel 150 feet activity   Assist Level: Dependent - Patient 0%    Refer to Care Plan for Long Term Goals  SHORT TERM GOAL WEEK 1 PT Short Term Goal 1 (Week 1): Pt will complete bed mobility with modA PT Short Term Goal 2 (Week 1):  Pt will complete bed<>chair transfers with maxA of 1 person PT Short Term Goal 3 (Week 1): Pt will maintain unsupported sitting balance for 30 seconds PT Short Term Goal 4 (Week 1): Pt will participate in functional outcome measure to assess falls risk  Recommendations for other services: Neuropsych  Skilled Therapeutic Intervention Mobility Bed Mobility Bed Mobility: Rolling Left;Rolling  Right;Supine to Sit Rolling Right: Maximal Assistance - Patient 25-49% Rolling Left: Total Assistance - Patient < 25% Supine to Sit: Maximal Assistance - Patient - Patient 25-49% Transfers Transfers: Sit to Stand;Stand to Sit;Squat Pivot Transfers Sit to Stand: 2 Helpers;Maximal Assistance - Patient 25-49% Stand to Sit: 2 Helpers;Maximal Assistance - Patient 25-49% Squat Pivot Transfers: 2 Helpers;Total Assistance - Patient < 25% Transfer (Assistive device): None Locomotion  Gait Ambulation: No Gait Gait: No Stairs / Additional Locomotion Stairs: No Wheelchair Mobility Wheelchair Mobility: No  Skilled Intervention: Pt in bed on arrival. Agreeable to PT evaluation and denies any pain (although does report chronic sciatica on L side). Pt A&Ox4, able to provide social factors and PLOF - speech dysarthric but intelligible. Provided him with TIS w/c to allow safe sitting and improved activity tolerance OOB. Assisted with UB/LB dressing (totalA for time). Initiated functional mobility as outlined above. Overall, requires maxA of 1 person for bed mobility, +2 totalA for squat pivot transfers, +2 modA for sit<>stands in the [place], and minA for static sitting balance. Presents with dense R sided weakness with no active movement observed. Pt is highly motivated but does fatigue quickly. Concluded session reclined in TIS with pillow supporting RUE, call bell in lap.   Instructed pt in results of PT evaluation as detailed above, PT POC, rehab potential, rehab goals, and discharge recommendations. Additionally  discussed CIR's policies regarding fall safety and use of chair alarm and/or quick release belt. Pt verbalized understanding and in agreement. Will update pt's family members as they become available.    Discharge Criteria: Patient will be discharged from PT if patient refuses treatment 3 consecutive times without medical reason, if treatment goals not met, if there is a change in medical status, if patient makes no progress towards goals or if patient is discharged from hospital.  The above assessment, treatment plan, treatment alternatives and goals were discussed and mutually agreed upon: by patient  Orrin Brigham  PT, DPT, CSRS  06/25/2022, 10:00 AM

## 2022-06-25 NOTE — Evaluation (Signed)
Occupational Therapy Assessment and Plan  Patient Details  Name: Jimmy Valentine MRN: [id number] Date of Birth: [birth year]  OT Diagnosis: acute pain and hemiplegia affecting dominant side Rehab Potential: Rehab Potential (ACUTE ONLY): Good ELOS: 28-30 days   Today's Date: 06/25/2022 OT Individual Time: 1045-1200 OT Individual Time Calculation (min): 75 min     Hospital Problem: Principal Problem:   Acute ischemic left MCA stroke [facility])   Past Medical History:  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,had a MRI, was prescribed testosterone by endocrinology   Sleep apnea    does't use cpap   Sudden hearing loss, left 2017   idiopathic sensorineural hearing loss   Past Surgical History:  Past Surgical History:  Procedure Laterality Date   TOTAL KNEE ARTHROPLASTY Right 2005   TOTAL KNEE ARTHROPLASTY  12/04/2011   LEFT-- TOTAL KNEE ARTHROPLASTY;  Surgeon: Eugenia Mcalpine, MD;  Location: [facility];  Service: Orthopedics;  Laterality: Left;    Assessment & Plan Clinical Impression: Jimmy Valentine is a 72 year old male who presented to [facility] ED on 06/21/2022 with sudden onset of right-sided weakness, dysarthria and facial drooping. Code stroke initiated. CT head showed questionable hyperdense left MCA m1 and a chronic lacunar infarct of the left corona radiata/basal ganglia. He was administered tenecteplase. 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 Carotid duplex showed bilateral stenosis 1-39%. MRI showed large MCA territory basal ganglia infarct with petechial hemorrhage with no hemorrhagic transformation or mass effect. Echo showed EF 65-70%, trivial MVR. Hgb A1c is 6.7%. LDL 91. Started for DAPT x 3 months for  intracranial atherosclerosis, then aspirin monotherapy, change statin to atorvastatin and continue at discharge.    On 5/19, patient's bedside RN noted worsening right-sided weakness from her initial assessment (initially slight pronator drift, now minimal antigravity strength noted) and neurology was notified while on the unit at 08:30 AM. Assessed by Dr. Viviann Spare.   Patient's right-sided weakness noted to be fluctuating but patient was still weaker on the right than bedside RN's initial assessment.  Stat CT head order placed and did not show new acute changes.  Patient has right facial droop, withdrawals to pain on right leg. No right upper extremity movement. He is tolerating a D3 diet/nectar liquids.  Reports he feels constipated.    His PMH is significant for arthritis, ED, gout, HLD, HTN, sleep apnea, DM and idiopathic sensorineural hearing loss (left)    Niece and son at bedside. Has daughter who is supportive as well. He lives alone. Patient has not had regular BM since admission. Reports left sciatica with mild symptoms currently. Brother in law can provide assistance after discharge. .  Patient transferred to CIR on 06/24/2022 .    Patient currently requires total with basic self-care skills secondary to decreased cardiorespiratoy endurance, abnormal tone, decreased attention to right, and decreased sitting balance, decreased standing balance, decreased postural control, and hemiplegia.  Prior to hospitalization, patient was fully independent, lived alone..  Patient will benefit from skilled intervention to increase independence with basic self-care skills prior to discharge home with care partner.  Anticipate patient will require minimal physical assistance and follow up home health.  OT - End  of Session Endurance Deficit: Yes OT Assessment Rehab Potential (ACUTE ONLY): Good OT Barriers to Discharge: Inaccessible home environment OT Barriers to Discharge Comments: 4 stairs OT Patient  demonstrates impairments in the following area(s): Balance;Endurance;Motor;Pain;Perception OT Basic ADL's Functional Problem(s): Bathing;Dressing;Toileting;Grooming OT Transfers Functional Problem(s): Toilet;Tub/Shower OT Additional Impairment(s): Fuctional Use of Upper Extremity OT Plan OT Intensity: Minimum of 1-2 x/day, 45 to 90 minutes OT Frequency: 5 out of 7 days OT Duration/Estimated Length of Stay: 28-30 days OT Treatment/Interventions: Balance/vestibular training;Discharge planning;Functional mobility training;Self Care/advanced ADL retraining;Therapeutic Exercise;Therapeutic Activities;UE/LE Strength taining/ROM;UE/LE Coordination activities;Patient/family education;Psychosocial support;DME/adaptive equipment instruction;Neuromuscular re-education;Visual/perceptual remediation/compensation;Pain management OT Self Feeding Anticipated Outcome(s): independent OT Basic Self-Care Anticipated Outcome(s): Min A OT Toileting Anticipated Outcome(s): Min A OT Bathroom Transfers Anticipated Outcome(s): Min A OT Recommendation Patient destination: Home Follow Up Recommendations: Home health OT Equipment Recommended: To be determined   OT Evaluation Precautions/Restrictions  Precautions Precautions: Fall Precaution Comments: R hemi; dysarthria Restrictions Weight Bearing Restrictions: No    Pain C/o L hip pain, changed out w/c cushion for a roho cushion   Home Living/Prior Functioning Home Living Family/patient expects to be discharged to:: Private residence Living Arrangements: Alone Available Help at Discharge: Family, Other (Comment), Neighbor, Available PRN/intermittently (son lives 20 miles, daughter lives in [place]. Has a neighbor that he plans on hiring for ADLs and assistance at home) Type of Home: House Home Access: Stairs to enter Secretary/administrator of Steps: 4 Entrance Stairs-Rails: Left Home Layout: One level Bathroom Shower/Tub: Walk-in shower  Lives With:  Alone Prior Function Level of Independence: Independent with basic ADLs, Independent with homemaking with ambulation, Independent with gait  Able to Take Stairs?: Yes Driving: Yes Vocation: Retired Research scientist (physical sciences)) Vision Baseline Vision/History: 1 Wears glasses (readers) Ability to See in Adequate Light: 0 Adequate Patient Visual Report: No change from baseline Vision Assessment?: No apparent visual deficits Perception  Perception: Impaired Inattention/Neglect: Does not attend to right side of body Praxis Praxis: Impaired Praxis Impairment Details: Motor planning;Initiation Cognition Cognition Overall Cognitive Status: Impaired/Different from baseline Arousal/Alertness: Awake/alert Orientation Level: Person;Situation;Place Person: Oriented Place: Oriented Situation: Oriented Memory: Impaired Memory Impairment: Retrieval deficit Attention: Focused;Sustained;Selective Focused Attention: Appears intact Sustained Attention: Appears intact Selective Attention: Impaired Selective Attention Impairment: Verbal basic;Functional basic Awareness: Impaired Awareness Impairment: Emergent impairment Problem Solving: Impaired Problem Solving Impairment: Verbal basic;Functional basic Safety/Judgment: Appears intact Comments: Will continue to assess in functional context Brief Interview for Mental Status (BIMS) Repetition of Three Words (First Attempt): 3 Temporal Orientation: Year: Correct Temporal Orientation: Month: Accurate within 5 days Temporal Orientation: Day: Correct Recall: "Sock": Yes, no cue required Recall: "Blue": Yes, after cueing ("a color") Recall: "Bed": Yes, no cue required BIMS Summary Score: 14 Sensation Sensation Light Touch: Appears Intact Hot/Cold: Appears Intact Proprioception: Impaired Detail Proprioception Impaired Details: Impaired RUE;Impaired RLE Stereognosis: Not tested Coordination Gross Motor Movements are Fluid and Coordinated: No Fine Motor Movements  are Fluid and Coordinated: No Coordination and Movement Description: Dense R sided weakness; hypertonia Heel Shin Test: UTA on R due to weakness Motor  Motor Motor: Hemiplegia;Abnormal tone;Motor apraxia;Abnormal postural alignment and control  Trunk/Postural Assessment  Cervical Assessment Cervical Assessment: Within Functional Limits Thoracic Assessment Thoracic Assessment: Exceptions to [facility] (rounded shoulders) Lumbar Assessment Lumbar Assessment: Exceptions to [facility] (posterior tilt) Postural Control Postural Control: Deficits on evaluation Trunk Control: R trunk lean, posterior bias Righting Reactions: delayed and insufficient  Balance Balance Balance Assessed: Yes Static Sitting Balance Static Sitting - Balance Support: Feet supported;No upper extremity supported Static Sitting - Level of Assistance:  4: Min assist Dynamic Sitting Balance Dynamic Sitting - Balance Support: Feet supported;No upper extremity supported;During functional activity Dynamic Sitting - Level of Assistance: 3: Mod assist Static Standing Balance Static Standing - Balance Support: Bilateral upper extremity supported Static Standing - Level of Assistance: 2: Max assist Dynamic Standing Balance Dynamic Standing - Balance Support: Bilateral upper extremity supported Dynamic Standing - Level of Assistance: 1: +2 Total assist Extremity/Trunk Assessment RUE Assessment RUE Assessment: Exceptions to [facility] RUE Body System: Neuro Brunstrum levels for arm and hand: Arm;Hand Brunstrum level for arm: Stage I Presynergy Brunstrum level for hand: Stage I Flaccidity RUE Tone RUE Tone: Mild (mild tone in finger flexors) LUE Assessment LUE Assessment: Within Functional Limits  Care Tool Care Tool Self Care Eating   Eating Assist Level: Set up assist    Oral Care    Oral Care Assist Level: Minimal Assistance - Patient > 75%    Bathing   Body parts bathed by patient: Chest;Face;Abdomen Body parts bathed by  helper: Right arm;Left arm;Front perineal area;Buttocks;Right upper leg;Left upper leg;Left lower leg;Right lower leg   Assist Level: Total Assistance - Patient < 25%    Upper Body Dressing(including orthotics)   What is the patient wearing?: Pull over shirt   Assist Level: Total Assistance - Patient < 25%    Lower Body Dressing (excluding footwear)   What is the patient wearing?: Pants;Incontinence brief Assist for lower body dressing: Total Assistance - Patient < 25%    Putting on/Taking off footwear   What is the patient wearing?: Non-skid slipper socks Assist for footwear: Total Assistance - Patient < 25%       Care Tool Toileting Toileting activity   Assist for toileting: 2 Helpers     Care Tool Bed Mobility Roll left and right activity   Roll left and right assist level: Total Assistance - Patient < 25%    Sit to lying activity   Sit to lying assist level: Maximal Assistance - Patient 25 - 49%    Lying to sitting on side of bed activity   Lying to sitting on side of bed assist level: the ability to move from lying on the back to sitting on the side of the bed with no back support.: Total Assistance - Patient < 25%     Care Tool Transfers Sit to stand transfer   Sit to stand assist level: 2 Helpers    Chair/bed transfer   Chair/bed transfer assist level: 2 Software engineer transfer   Assist Level: 2 Helpers     Care Tool Cognition  Expression of Ideas and Wants Expression of Ideas and Wants: 4. Without difficulty (complex and basic) - expresses complex messages without difficulty and with speech that is clear and easy to understand  Understanding Verbal and Non-Verbal Content Understanding Verbal and Non-Verbal Content: 4. Understands (complex and basic) - clear comprehension without cues or repetitions   Memory/Recall Ability Memory/Recall Ability : Current season;That he or she is in a hospital/hospital unit   Refer to Care Plan for Long Term  Goals  SHORT TERM GOAL WEEK 1 OT Short Term Goal 1 (Week 1): Pt will be able to sit EOB with close S and don shirt with mod A or less. OT Short Term Goal 2 (Week 1): Pt will be able to sit to stand from EOB with max A of 1 to  prep for LB dressing. OT Short Term Goal 3 (Week 1): Pt will complete squat pivot to  toilet with max A of 1. OT Short Term Goal 4 (Week 1): Pt will be able to perform gentle self ROM on RUE with min A.  Recommendations for other services: None    Skilled Therapeutic Intervention ADL ADL ADL Comments: total assist overall except for self feeding with set up Mobility  Bed Mobility Bed Mobility: Rolling Left;Rolling Right;Supine to Sit Rolling Right: Maximal Assistance - Patient 25-49% Rolling Left: Total Assistance - Patient < 25% Supine to Sit: Maximal Assistance - Patient - Patient 25-49% Transfers Sit to Stand: 2 Helpers;Maximal Assistance - Patient 25-49% Stand to Sit: 2 Helpers;Maximal Assistance - Patient 25-49%  Pt seen for initial evaluation and NMR training.  Pt received in tilt in space w.c. C/o L hip pain from what he felt was sciatica.  Changed out cushion for roho with pt using the stedy to stand for therapist to change cusions.  Pt able to rise to stand with support of holding his R arm.  Adjusted leg rests.   Reviewed role of OT, discussed POC and pt's goals, and ELOS.  Pt has dense hemiplegia with good sensation. Educated him on the use of estim for NMR. Pt agreed to try it.  RN removed IV line from R forearm.  Estim applied to forearm to facilitate wrist and finger extension using Empi unit on small muscle atrophy. Pt able to tolerate an intensity of 25 for 30 minutes. Pt cued to look at hand and focus on his movement and "try" to keep extending hand.  Pt tolerated well with no adverse affects.  Pt requested to lay down. Able to rise to stand in stedy with Min A to support arm.  Sat to EOB and then +2 A to help him move to sidelying and then pt rolled  on his back.  Pt resting in bed with all needs met and bed alarm set.      Discharge Criteria: Patient will be discharged from OT if patient refuses treatment 3 consecutive times without medical reason, if treatment goals not met, if there is a change in medical status, if patient makes no progress towards goals or if patient is discharged from hospital.  The above assessment, treatment plan, treatment alternatives and goals were discussed and mutually agreed upon: by patient  [facility] 06/25/2022, 12:35 PM

## 2022-06-25 NOTE — Plan of Care (Signed)
  Problem: RH Balance Goal: LTG Patient will maintain dynamic sitting balance (PT) Description: LTG:  Patient will maintain dynamic sitting balance with assistance during mobility activities (PT) Flowsheets (Taken 06/25/2022 1001) LTG: Pt will maintain dynamic sitting balance during mobility activities with:: Minimal Assistance - Patient > 75% Goal: LTG Patient will maintain dynamic standing balance (PT) Description: LTG:  Patient will maintain dynamic standing balance with assistance during mobility activities (PT) Flowsheets (Taken 06/25/2022 1001) LTG: Pt will maintain dynamic standing balance during mobility activities with:: Minimal Assistance - Patient > 75%   Problem: Sit to Stand Goal: LTG:  Patient will perform sit to stand with assistance level (PT) Description: LTG:  Patient will perform sit to stand with assistance level (PT) Flowsheets (Taken 06/25/2022 1001) LTG: PT will perform sit to stand in preparation for functional mobility with assistance level: Minimal Assistance - Patient > 75%   Problem: RH Bed Mobility Goal: LTG Patient will perform bed mobility with assist (PT) Description: LTG: Patient will perform bed mobility with assistance, with/without cues (PT). Flowsheets (Taken 06/25/2022 1001) LTG: Pt will perform bed mobility with assistance level of: Minimal Assistance - Patient > 75%   Problem: RH Bed to Chair Transfers Goal: LTG Patient will perform bed/chair transfers w/assist (PT) Description: LTG: Patient will perform bed to chair transfers with assistance (PT). Flowsheets (Taken 06/25/2022 1001) LTG: Pt will perform Bed to Chair Transfers with assistance level: Minimal Assistance - Patient > 75%   Problem: RH Car Transfers Goal: LTG Patient will perform car transfers with assist (PT) Description: LTG: Patient will perform car transfers with assistance (PT). Flowsheets (Taken 06/25/2022 1001) LTG: Pt will perform car transfers with assist:: Minimal Assistance -  Patient > 75%   Problem: RH Ambulation Goal: LTG Patient will ambulate in controlled environment (PT) Description: LTG: Patient will ambulate in a controlled environment, # of feet with assistance (PT). Flowsheets (Taken 06/25/2022 1001) LTG: Pt will ambulate in controlled environ  assist needed:: Minimal Assistance - Patient > 75% LTG: Ambulation distance in controlled environment: 2ft Goal: LTG Patient will ambulate in home environment (PT) Description: LTG: Patient will ambulate in home environment, # of feet with assistance (PT). Flowsheets (Taken 06/25/2022 1001) LTG: Pt will ambulate in home environ  assist needed:: Minimal Assistance - Patient > 75% LTG: Ambulation distance in home environment: 43ft   Problem: RH Wheelchair Mobility Goal: LTG Patient will propel w/c in controlled environment (PT) Description: LTG: Patient will propel wheelchair in controlled environment, # of feet with assist (PT) Flowsheets (Taken 06/25/2022 1001) LTG: Pt will propel w/c in controlled environ  assist needed:: Supervision/Verbal cueing LTG: Propel w/c distance in controlled environment: 143ft Goal: LTG Patient will propel w/c in home environment (PT) Description: LTG: Patient will propel wheelchair in home environment, # of feet with assistance (PT). Flowsheets (Taken 06/25/2022 1001) LTG: Pt will propel w/c in home environ  assist needed:: Supervision/Verbal cueing LTG: Propel w/c distance in home environment: 39ft   Problem: RH Stairs Goal: LTG Patient will ambulate up and down stairs w/assist (PT) Description: LTG: Patient will ambulate up and down # of stairs with assistance (PT) Flowsheets (Taken 06/25/2022 1001) LTG: Pt will ambulate up/down stairs assist needed:: Minimal Assistance - Patient > 75% LTG: Pt will  ambulate up and down number of stairs: 4 with 1 railing on L or as per home setup

## 2022-06-25 NOTE — Plan of Care (Signed)
Problem: RH Balance Goal: LTG: Patient will maintain dynamic sitting balance (OT) Description: LTG:  Patient will maintain dynamic sitting balance with assistance during activities of daily living (OT) Flowsheets (Taken 06/25/2022 1250) LTG: Pt will maintain dynamic sitting balance during ADLs with: Supervision/Verbal cueing Goal: LTG Patient will maintain dynamic standing with ADLs (OT) Description: LTG:  Patient will maintain dynamic standing balance with assist during activities of daily living (OT)  Flowsheets (Taken 06/25/2022 1250) LTG: Pt will maintain dynamic standing balance during ADLs with: Minimal Assistance - Patient > 75%   Problem: Sit to Stand Goal: LTG:  Patient will perform sit to stand in prep for activites of daily living with assistance level (OT) Description: LTG:  Patient will perform sit to stand in prep for activites of daily living with assistance level (OT) Flowsheets (Taken 06/25/2022 1250) LTG: PT will perform sit to stand in prep for activites of daily living with assistance level: Contact Guard/Touching assist   Problem: RH Eating Goal: LTG Patient will perform eating w/assist, cues/equip (OT) Description: LTG: Patient will perform eating with assist, with/without cues using equipment (OT) Flowsheets (Taken 06/25/2022 1250) LTG: Pt will perform eating with assistance level of: Independent   Problem: RH Grooming Goal: LTG Patient will perform grooming w/assist,cues/equip (OT) Description: LTG: Patient will perform grooming with assist, with/without cues using equipment (OT) Flowsheets (Taken 06/25/2022 1250) LTG: Pt will perform grooming with assistance level of: Set up assist    Problem: RH Bathing Goal: LTG Patient will bathe all body parts with assist levels (OT) Description: LTG: Patient will bathe all body parts with assist levels (OT) Flowsheets (Taken 06/25/2022 1250) LTG: Pt will perform bathing with assistance level/cueing: Minimal Assistance -  Patient > 75%   Problem: RH Dressing Goal: LTG Patient will perform upper body dressing (OT) Description: LTG Patient will perform upper body dressing with assist, with/without cues (OT). Flowsheets (Taken 06/25/2022 1250) LTG: Pt will perform upper body dressing with assistance level of: Minimal Assistance - Patient > 75% Goal: LTG Patient will perform lower body dressing w/assist (OT) Description: LTG: Patient will perform lower body dressing with assist, with/without cues in positioning using equipment (OT) Flowsheets (Taken 06/25/2022 1250) LTG: Pt will perform lower body dressing with assistance level of: Minimal Assistance - Patient > 75%   Problem: RH Toileting Goal: LTG Patient will perform toileting task (3/3 steps) with assistance level (OT) Description: LTG: Patient will perform toileting task (3/3 steps) with assistance level (OT)  Flowsheets (Taken 06/25/2022 1250) LTG: Pt will perform toileting task (3/3 steps) with assistance level: Minimal Assistance - Patient > 75%   Problem: RH Functional Use of Upper Extremity Goal: LTG Patient will use RT/LT upper extremity as a (OT) Description: LTG: Patient will use right/left upper extremity as a stabilizer/gross assist/diminished/nondominant/dominant level with assist, with/without cues during functional activity (OT) Flowsheets (Taken 06/25/2022 1250) LTG: Use of upper extremity in functional activities: RUE as a stabilizer LTG: Pt will use upper extremity in functional activity with assistance level of: Supervision/Verbal cueing   Problem: RH Toilet Transfers Goal: LTG Patient will perform toilet transfers w/assist (OT) Description: LTG: Patient will perform toilet transfers with assist, with/without cues using equipment (OT) Flowsheets (Taken 06/25/2022 1250) LTG: Pt will perform toilet transfers with assistance level of: Minimal Assistance - Patient > 75%   Problem: RH Tub/Shower Transfers Goal: LTG Patient will perform  tub/shower transfers w/assist (OT) Description: LTG: Patient will perform tub/shower transfers with assist, with/without cues using equipment (OT) Flowsheets (Taken 06/25/2022 1250) LTG:  Pt will perform tub/shower stall transfers with assistance level of: Moderate Assistance - Patient 50 - 74% LTG: Pt will perform tub/shower transfers from: Walk in shower

## 2022-06-25 NOTE — Progress Notes (Signed)
VASCULAR LAB    Bilateral lower extremity venous duplex has been performed.  See CV proc for preliminary results.   Francesa Eugenio, RVT 06/25/2022, 4:06 PM

## 2022-06-25 NOTE — Progress Notes (Signed)
Inpatient Rehabilitation Care Coordinator Assessment and Plan Patient Details  Name: Jimmy Valentine MRN: [id number] Date of Birth: 28-Jun-1950  Today's Date: 06/25/2022  Hospital Problems: Principal Problem:   Acute ischemic left MCA stroke [facility])  Past Medical History:  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,had a MRI, was prescribed testosterone by endocrinology   Sleep apnea    does't use cpap   Sudden hearing loss, left 2017   idiopathic sensorineural hearing loss   Past Surgical History:  Past Surgical History:  Procedure Laterality Date   TOTAL KNEE ARTHROPLASTY Right 2005   TOTAL KNEE ARTHROPLASTY  12/04/2011   LEFT-- TOTAL KNEE ARTHROPLASTY;  Surgeon: Eugenia Mcalpine, MD;  Location: [facility];  Service: Orthopedics;  Laterality: Left;   Social History:  reports that he quit smoking about 26 years ago. His smoking use included cigarettes. He has a 15.00 pack-year smoking history. He has never used smokeless tobacco. He reports current alcohol use of about 12.0 standard drinks of alcohol per week. He reports that he does not use drugs.  Family / Support Systems Marital Status: Widow/Widower Spouse/Significant Other: n/a Children: Geophysicist/field seismologist (Daughter), Evangeline Gula (son) Other Supports: Brother in Social worker Anticipated Caregiver: Daughter, primary contact. Ability/Limitations of Caregiver: Min a Caregiver Availability: 24/7 Family Dynamics: support from children, brother in law and family.  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 Recent Psychosocial Issues: Coping Psychiatric History: N/A Substance Abuse History: N/A  Patient / Family Perceptions, Expectations & Goals Pt/Family understanding of illness & functional limitations: yes Premorbid pt/family roles/activities: Modifed independent, hx of falls, not driving. Anticipated changes in roles/activities/participation: Supervison from daughter, reported brother in law and other family members. DTR will confirm closer to d/c. Pt/family expectations/goals: Supervision/MOD I  [employer]: None Premorbid Home Care/DME Agencies: None Transportation available at discharge: Family able to transport Is the patient able to respond to transportation needs?: Yes In the past 12 months, has lack of transportation kept you from medical appointments or from getting medications?: No In the past 12 months, has lack of transportation kept you from meetings, work, or from getting things needed for daily living?: No Resource referrals recommended: Neuropsychology  Discharge Planning Living Arrangements: Alone Support Systems: Children, Other relatives Type of Residence: Private residence (1 level home, 4 steps) Insurance Resources: Media planner (specify) Radiographer, therapeutic) Financial Resources: Restaurant manager, fast food Screen Referred: No Living Expenses: Psychologist, sport and exercise Management: Patient Does the patient have any problems obtaining your  medications?: No Home Management: Independent Patient/Family Preliminary Plans: Family plans to assist Care Coordinator Barriers to Discharge: Lack of/limited family support, Insurance for SNF coverage, Decreased  caregiver support Care Coordinator Anticipated Follow Up Needs: HH/OP Expected length of stay: 21-24 Days  Clinical Impression SW met with patient and called daughter, Kennyth Arnold at bedside. Pt previously living alone. Pt will discharge home with his brother in law, daughter and other family members to assist.  Sw will continue to fu with daughter with updates. No additional questions or concerns.   Andria Rhein 06/25/2022, 1:06 PM

## 2022-06-25 NOTE — Progress Notes (Signed)
PROGRESS NOTE   Subjective/Complaints:    Objective:   No results found. Recent Labs    06/25/22 0554  WBC 8.7  HGB 16.4  HCT 44.8  PLT 215   Recent Labs    06/25/22 0554  NA 128*  K 4.2  CL 95*  CO2 21*  GLUCOSE 151*  BUN 17  CREATININE 1.02  CALCIUM 8.8*    Intake/Output Summary (Last 24 hours) at 06/25/2022 2956 Last data filed at 06/25/2022 0505 Gross per 24 hour  Intake 478 ml  Output 150 ml  Net 328 ml        Physical Exam: Vital Signs Blood pressure (!) 150/75, pulse 68, temperature 97.6 F (36.4 C), temperature source Oral, resp. rate 17, height 5\' 9"  (1.753 m), weight 99.8 kg, SpO2 96 %.  General: No acute distress Mood and affect are appropriate Heart: Regular rate and rhythm no rubs murmurs or extra sounds Lungs: Clear to auscultation, breathing unlabored, no rales or wheezes Abdomen: Positive bowel sounds, soft nontender to palpation, nondistended Extremities: No clubbing, cyanosis,mild dorsum RIght hand edema  Skin: No evidence of breakdown, no evidence of rash Neurologic: Cranial nerves II through XII intact, motor strength is 5/5 in bilateral deltoid, bicep, tricep, grip, hip flexor, knee extensors, ankle dorsiflexor and plantar flexor Sensory exam normal sensation to light touch and proprioception in bilateral upper and lower extremities Cerebellar exam weakness on right precludes testing  Musculoskeletal: Full range of motion in all 4 extremities. No joint swelling,     Assessment/Plan: 1. Functional deficits which require 3+ hours per day of interdisciplinary therapy in a comprehensive inpatient rehab setting. Physiatrist is providing close team supervision and 24 hour management of active medical problems listed below. Physiatrist and rehab team continue to assess barriers to discharge/monitor patient progress toward functional and medical goals  Care Tool:  Bathing               Bathing assist       Upper Body Dressing/Undressing Upper body dressing        Upper body assist      Lower Body Dressing/Undressing Lower body dressing            Lower body assist       Toileting Toileting    Toileting assist       Transfers Chair/bed transfer  Transfers assist           Locomotion Ambulation   Ambulation assist              Walk 10 feet activity   Assist           Walk 50 feet activity   Assist           Walk 150 feet activity   Assist           Walk 10 feet on uneven surface  activity   Assist           Wheelchair     Assist               Wheelchair 50 feet with 2 turns activity    Assist  Wheelchair 150 feet activity     Assist          Blood pressure (!) 150/75, pulse 68, temperature 97.6 F (36.4 C), temperature source Oral, resp. rate 17, height 5\' 9"  (1.753 m), weight 99.8 kg, SpO2 96 %.  Medical Problem List and Plan: 1. 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 Large basal ganglia infarct. 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              -patient may  shower             -ELOS/Goals: 21-24 days             -Continue WHO and PRAFO             -Admit to CIR   2.  Antithrombotics: -DVT/anticoagulation:  Pharmaceutical: Lovenox             -antiplatelet therapy: Aspirin and Plavix for three months followed by aspirin alone (started 5/20)   3. Pain Management: Tylenol, Robaxin as needed   4. Mood/Behavior/Sleep: LCSW to evaluate and provide emotional support             -antipsychotic agents: n/a             -trazodone as needed   5. Neuropsych/cognition: This patient is capable of making decisions on his own behalf.   6. Skin/Wound Care: Routine skin care checks   7.  Fluids/Electrolytes/Nutrition: Routine Is and Os and follow-up chemistries             -continue dys3/nectar thick; SLP eval   8: Hypertension: monitor TID and prn (home meds: amlodipine 10 mg QD, valsartan 320 mg daily)             -continue irbesartan 37.5 mg daily Vitals:   06/24/22 2030 06/25/22 0503  BP: (!) 145/78 (!) 150/75  Pulse: 75 68  Resp: 18 17  Temp: 97.7 F (36.5 C) 97.6 F (36.4 C)  SpO2: 97% 96%   Will need to keep BPs on higher side due to intracranial stenosis    9: Hyperlipidemia: continue high intensity statin, change simvastatin to atorvastatin 80mg  daily   10: Hyponatremia, mild Na 132: follow-up BMP, will fluid restrict - recheck on 5/27   11: History of gout: restart home colchicine   12: DM type II: HGB A1C 6.7, CBGs QID and SSI; carb  modified diet when advanced (home meds include metformin 1000 mg BID) CBG (last 3)  Recent Labs    06/24/22 1711 06/24/22 2110 06/25/22 0603  GLUCAP 159* 162* 170*   Adequate control monitor 5/23   13: RLE/RUE weakness: check RLE venous duplex   14: Tobacco use: occasional cigars, advise cessation   15. Constipation             -Miralax daily, sorbitol PRN started    LOS: 1 days A FACE TO FACE EVALUATION WAS PERFORMED  Erick Colace 06/25/2022, 7:22 AM

## 2022-06-25 NOTE — Progress Notes (Signed)
Inpatient Rehabilitation  Patient information reviewed and entered into eRehab system by Kolson Chovanec M. Moataz Tavis, M.A., CCC/SLP, PPS Coordinator.  Information including medical coding, functional ability and quality indicators will be reviewed and updated through discharge.    

## 2022-06-26 ENCOUNTER — Other Ambulatory Visit: Payer: Self-pay | Admitting: Internal Medicine

## 2022-06-26 DIAGNOSIS — I63512 Cerebral infarction due to unspecified occlusion or stenosis of left middle cerebral artery: Secondary | ICD-10-CM | POA: Diagnosis not present

## 2022-06-26 LAB — GLUCOSE, CAPILLARY
Glucose-Capillary: 167 mg/dL — ABNORMAL HIGH (ref 70–99)
Glucose-Capillary: 211 mg/dL — ABNORMAL HIGH (ref 70–99)
Glucose-Capillary: 169 mg/dL — ABNORMAL HIGH (ref 70–99)
Glucose-Capillary: 148 mg/dL — ABNORMAL HIGH (ref 70–99)

## 2022-06-26 MED ORDER — SENNOSIDES-DOCUSATE SODIUM 8.6-50 MG PO TABS
2.0000 | ORAL_TABLET | Freq: Two times a day (BID) | ORAL | Status: DC
  Administered 2022-06-26 – 2022-06-28 (×5): 2 via ORAL
  Filled 2022-06-26 (×5): qty 2

## 2022-06-26 NOTE — Progress Notes (Signed)
Physical Therapy Session Note  Patient Details  Name: Jimmy Valentine MRN: [id number] Date of Birth: [birth year]  Today's Date: 06/26/2022 PT Individual Time: 1338-1440 PT Individual Time Calculation (min): 62 min   Short Term Goals: Week 1:  PT Short Term Goal 1 (Week 1): Pt will complete bed mobility with modA PT Short Term Goal 2 (Week 1): Pt will complete bed<>chair transfers with maxA of 1 person PT Short Term Goal 3 (Week 1): Pt will maintain unsupported sitting balance for 30 seconds PT Short Term Goal 4 (Week 1): Pt will participate in functional outcome measure to assess falls risk  Skilled Therapeutic Interventions/Progress Updates: Patient in TIS on entrance to room. Patient alert and agreeable to PT session.   Patient reported agitation that nursing never came to put patient back into bed after lunch (PTA stepped in during lunch to tell patient of early start time to upcoming PT session vs regular start time. PTA asked nursing if they could assist in getting patient back to bed), and that his sciatic pain was hurting (9/10 pain).  Therapeutic Activity: Bed Mobility: Pt performed sit to supine from edge of mat via L sidelying with maxA. Patient provided with multimodal cues to use L UE to help in controlling descent, and totalA to advance R LE onto mat. Patient then instructed to use L elbow to assist in elevating trunk to achieve short sitting (L sidelying). Patient required modA to elevate trunk with PTA also providing total A to advance R LE off mat.  Transfers: Pt performed sit<>stand transfer in STEDY with minA to achieve standing with use of L UE for support, and cues given to pull on STEDY bar. Min-modA required for patient to maintain up right truncal support while being transported from TIS to EOB. Patient then with heavy modA to descend onto bed with cues to control descending with L UE on bar.  Patient performed squat pivot + 2 to the L (x2) with maxA for sequence, balance,  and strength. Patient provided with instructional cues to use L UE to assist in controlling transfer. PTA blocked R knee and facilitated head/hip relationship.  Neuromuscular Re-ed: NMR facilitated during session with focus on proprioceptive feedback on R LE, motor coordination, weight shifting, dynamic sitting balance, and musculature sequence for stand ready mechanics. - Attempted 1st sit to stand at beginning of session, but patient presented with increased fatigue and L LE pain 2/2 sciatica. Patient then provided with trigger point release and stretching as indicated in therapeutic exercise. - 2 x sit to stand in hi/low mat on lowest setting. Initially with mod-heavy modA + 2 (PTA on hemi side - R - and rehab tech on L providing assistance holding gait belt, and HHA when getting to standing).  - dynamic sitting balance with forward reaching (L UE) in multiple planes with help of rehab tech holding cones. Tech cued to increase distance to assess patient's ability to maintain postural control without LOB. Patient demonstrated CGA/minA when reaching to L, and minA-maxA when reaching to R after further distances.  - PTA in front of patient providing tactile cues at the hips to facilitate and promote anterior pelvic tilt, and forward weight shift to prepare patient for sit to stand mechanics. Patient required mod cues to forward flex at the hips while maintaining neutral spine. Performed multiple rounds of anterior weight shift with anterior pelvic tilt prior to final x 2 sit to stand. - Patient then performed 2 more sit to stands at end of  session with heavy minA-light modA + 1 (PTA on R side approximating R knee, and tech on L side for safety and holding patient's pants around waist as they had a tendency to fall off).  - Patient required maxA + 2 to remain in standing throughout 4 sit to stand interventions, and presented with heavy R lean on PTA with poor truncal support, balance and notable L hip drop.  R quad activation noted per patient ability to maintain R knee extension with equal weight distribution, but notable buckle during weight shifting to R side. Multiple rounds of lateral weight shifts performed through each standing trial.   NMR performed for improvements in motor control and coordination, balance, sequencing, judgement, and self confidence/ efficacy in performing all aspects of mobility at highest level of independence.   Manual Therapy: Pt performed the following exercises with therapist providing the described cuing and facilitation for improvement. - Trigger point release to L glute with patient short sitting edge of mat leaning to R side with rehab tech assisting. Patient then in supine for HS/piriformis stretch (4 x 30 seconds). Patient reported decrease in pain afterwards (unrated) and 0/10 pain at end of session.  Patient supine in bed at end of session with brakes locked, family present, belt alarm set, and all needs within reach.  - Postural Assessment Scale for Stroke Patients (PASS)  Give the subject instructions for each item as written below. When scoring the item, record the lowest response category that applies for each item.  Maintaining a Posture  2 1. Sitting Without Support Instructions: Have the subject sit on a bench/mat without back support and with feet flat on the floor. (3) Can sit for 5 minutes without support (2) Can sit for more than 10 seconds without support (1) Can sit with slight support (for example, by 1 hand) (0) Cannot sit  1 2. Standing With Support Instructions: Have the subject stand, providing support as needed. Evaluate only the ability to stand with or without support. Do not consider the quality of the stance. (3) Can stand with support of only 1 hand (2) Can stand with moderate support of 1 person (1) Can stand with strong support of 2 people (0) Cannot stand, even with support  0 3. Standing Without Support Instructions: Have  the subject stand without support. Evaluate only the ability to stand with or without support. Do not consider the quality of the stance. (3) Can stand without support for more than 1 minute and simultaneously perform arm movements at about shoulder level (2) Can stand without support for 1 minute or stands slightly asymmetrically  (1) Can stand without support for 10 seconds or leans heavily on 1 leg (0) Cannot stand without support  0 4. Standing on Nonparetic Leg Instructions: Have the subject stand on the nonparetic leg. Evaluate only the ability to bear weight entirely on the nonparetic leg. Do not consider how the subject accomplishes the task. (3) Can stand on nonparetic leg for more than 10 seconds (2) Can stand on nonparetic leg for more than 5 seconds (1) Can stand on nonparetic leg for a few seconds (0) Cannot stand on nonparetic leg  0 5. Standing on Paretic Leg Instructions: Have the subject stand on the paretic leg. Evaluate only the ability to bear weight entirely on the paretic leg. Do not consider how the subject accomplishes the task. (3) Can stand on paretic leg for more than 10 seconds (2) Can stand on paretic leg for more than 5  seconds (1) Can stand on paretic leg for a few seconds (0) Cannot stand on paretic leg  Maintaining Posture SUBTOTAL 3  Changing a Posture  1 6. Supine to Paretic Side Lateral Instructions: Begin with the subject in supine on a treatment mat. Instruct the subject to roll to the paretic side (lateral movement). Assist as necessary. Evaluate the subject's performance on the amount of help required. Do not consider the quality of performance. (3) Can perform without help (2) Can perform with little help (1) Can perform with much help (0) Cannot perform  0 7. Supine to Nonparetic Side Lateral Instructions: Begin with the subject in supine on a treatment mat. Instruct the subject to roll to the nonparetic side (lateral movement). Assist as  necessary. Evaluate the subject's performance on the amount of help required. Do not consider the quality of performance. (3) Can perform without help (2) Can perform with little help (1) Can perform with much help (0) Cannot perform  1 8. Supine to Sitting Up on the Edge of the Mat Instructions: Begin with the subject in supine on a treatment mat. Instruct the subject to come to sitting on the edge of the mat. Assist as necessary. Evaluate the subject's performance on the amount of help required. Do not consider the quality of performance. (3) Can perform without help (2) Can perform with little help (1) Can perform with much help (0) Cannot perform  1 9. Sitting on the Edge of the Mat to Supine Instructions: Begin with the on the edge of a treatment mat. Instruct the subject to return to supine. Assist as necessary. Evaluate the subject's performance on the amount of help required. Do not consider the quality of performance. (3) Can perform without help (2) Can perform with little help (1) Can perform with much help (0) Cannot perform  1 10. Sitting to Standing Up Instructions: Begin with the subject sitting on the edge of a treatment mat. Instruct the subject to stand up without support. Assist if necessary. Evaluate the subject's performance on the amount of help required. Do not consider the quality of performance. (3) Can perform without help (2) Can perform with little help (1) Can perform with much help (0) Cannot perform  1 11. Standing Up to Sitting Down Instructions: Begin with the subject standing by edge of a treatment mat. Instruct the subject to sit on edge of mat without support. Assist if necessary. Evaluate the subject's performance on the amount of help required. Do not consider the quality of performance. (3) Can perform without help (2) Can perform with little help (1) Can perform with much help (0) Cannot perform  0 12. Standing, Picking Up a Pencil from the  Floor Instructions: Begin with the subject standing. Instruct the subject to pick up a pencil fro the floor without support. Assist if necessary. Evaluate the subject's performance on the amount of help required. Do not consider the quality of performance. (3) Can perform without help (2) Can perform with little help (1) Can perform with much help (0) Cannot perform  Changing Posture SUBTOTAL 5   TOTAL 8      Therapy Documentation Precautions:  Precautions Precautions: Fall Precaution Comments: R hemi; dysarthria Restrictions Weight Bearing Restrictions: No  Therapy/Group: Individual Therapy  Roland Lipke PTA  06/26/2022, 3:55 PM

## 2022-06-26 NOTE — Evaluation (Signed)
Speech Language Pathology Assessment and Plan  Patient Details  Name: Jimmy Valentine MRN: [id number] Date of Birth: 05/19/1950  SLP Diagnosis: Aphasia;Dysphagia;Dysarthria  Rehab Potential: Good ELOS: 4 weeks    Today's Date: 06/26/2022 SLP Individual Time: 0800-0900 SLP Individual Time Calculation (min): 60 min   Hospital Problem: Principal Problem:   Acute ischemic left MCA stroke [facility])  Past Medical History:  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,had a MRI, was prescribed testosterone by endocrinology   Sleep apnea    does't use cpap   Sudden hearing loss, left 2017   idiopathic sensorineural hearing loss   Past Surgical History:  Past Surgical History:  Procedure Laterality Date   TOTAL KNEE ARTHROPLASTY Right 2005   TOTAL KNEE ARTHROPLASTY  12/04/2011   LEFT-- TOTAL KNEE ARTHROPLASTY;  Surgeon: Eugenia Mcalpine, MD;  Location: [facility] ORS;  Service: Orthopedics;  Laterality: Left;    Assessment / Plan / Recommendation Clinical Impression Deston Gane is a 72 year old male who presented to [facility] ED on 06/21/2022 with sudden onset of right-sided weakness, dysarthria and facial drooping. Code stroke initiated. CT head showed questionable hyperdense left MCA m1 and a chronic lacunar infarct of the left corona radiata/basal ganglia. He was administered tenecteplase. 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 Carotid duplex showed bilateral stenosis 1-39%. MRI showed large MCA territory basal ganglia infarct with petechial hemorrhage with no hemorrhagic transformation or mass effect. Echo showed EF 65-70%, trivial MVR. Hgb A1c is 6.7%. LDL 91. Started for DAPT x 3 months for intracranial atherosclerosis, then  aspirin monotherapy, change statin to atorvastatin and continue at discharge. Patient has right facial droop, withdrawals to pain on right leg. No right upper extremity movement. He is tolerating a D3 diet/nectar liquid. Pt was admitted to CIR on 06/24/22.   Bedside Swallow Evaluation completed and revealed mild to moderate oropharyngeal dysphagia. Oral mechanism exam revealed R side facial droop, tongue deviation to right side, decreased lingual and labial strength on right side and natural dentition with 2 missing molars. Pt with strong volitional cough and ability to elicit swallow. BSE was completed with nectar thickened liquids, thin liquids, D1 and D3 trials. Pt was alert and seated upright in bed. Puree and nectar thick liquid trials were unremarkable. Pt presented with right side anterior spillage and decreased bolus formation of D3 textures. Pt with timely swallow initiation with good oral clearance. Oral hygiene via suction toothbrush completed prior to thin liquid trials. Pt with anterior spillage and decreased bolus control resulting in immediate cough in response to thin liquids at cup edge. Pt challenged with straw sips with cues to place straw on left side with no immediate s/sx pen/asp however, onset of wet vocal quality and productive cough observed post trials. SLP presented ice chips via spoon with pt tolerating single ice chips without s/sx pen/ asp. SLP recommending Dys 3 and nectar thick liquids.   Pt also presents with moderate dysarthria with oral motor deficits as mentioned above. Dysarthria c/b hyponasality and monotone speech. Further deficits include imprecise articulation and overall speech intelligibility ~80% with words and ~60% with phrases and sentences. Language was assessed informally with strengths in auditory comprehension,  repetition, and naming (20/20 on WAB). Informally, pt presents with word finding deficits impeding successful communication exchanges.   Pt would benefit  from skilled SLP services to maximize dysphagia, aphasia, and dysarthria in order to maximize his independence prior to discharge. Anticipate pt will require supervision at home and f/u home health SLP services.    Skilled Therapeutic Interventions          BSE, informal assessment measures, and WAB administered. Please see full report for additional details.     SLP Assessment  Patient will need skilled Speech Lanaguage Pathology Services during CIR admission    Recommendations  SLP Diet Recommendations: Dysphagia 3 (Mech soft);Nectar;Ice chips PRN after oral care Liquid Administration via: Cup;Straw Medication Administration: Whole meds with liquid Supervision: Patient able to self feed Compensations: Slow rate;Small sips/bites;Minimize environmental distractions;Lingual sweep for clearance of pocketing Oral Care Recommendations: Oral care BID    SLP Frequency 1 to 3 out of 7 days   SLP Duration  SLP Intensity  SLP Treatment/Interventions 4 weeks  Minumum of 1-2 x/day, 30 to 90 minutes  Dysphagia/aspiration precaution training;Speech/Language facilitation;Internal/external aids;Functional tasks;Patient/family education;Multimodal communication approach;Therapeutic Exercise    Pain Pain Assessment Pain Scale: 0-10 Pain Score: 0-No pain  Prior Functioning Cognitive/Linguistic Baseline: Within functional limits Type of Home: House  Lives With: Alone Available Help at Discharge: Family;Other (Comment);Neighbor;Available PRN/intermittently Vocation: Retired  SLP Evaluation Cognition Overall Cognitive Status: Within Functional Limits for tasks assessed Arousal/Alertness: Awake/alert Orientation Level: Oriented X4 Year: 2024 Month: May Day of Week: Correct Attention: Focused;Sustained;Selective Focused Attention: Appears intact Sustained Attention: Appears intact Selective Attention: Appears intact Memory: Appears intact Awareness: Appears intact Awareness Impairment:  Emergent impairment Problem Solving: Appears intact Safety/Judgment: Appears intact  Comprehension Auditory Comprehension Overall Auditory Comprehension: Appears within functional limits for tasks assessed Expression Expression Primary Mode of Expression: Verbal Verbal Expression Overall Verbal Expression: Impaired Initiation: No impairment Automatic Speech: Name;Social Response;Counting;Day of week;Month of year Level of Generative/Spontaneous Verbalization: Word;Phrase;Sentence Repetition: No impairment Naming: Impairment Responsive: Not tested Confrontation: Within functional limits Convergent: 50-74% accurate Divergent: Not tested Pragmatics: No impairment Impairments: Abnormal affect Interfering Components: Speech intelligibility Non-Verbal Means of Communication: Not applicable Written Expression Dominant Hand: Right Written Expression: Not tested Oral Motor Oral Motor/Sensory Function Overall Oral Motor/Sensory Function: Moderate impairment Facial ROM: Reduced right Facial Symmetry: Abnormal symmetry right Facial Strength: Reduced right Facial Sensation: Reduced right Lingual ROM: Within Functional Limits Lingual Symmetry: Within Functional Limits Lingual Strength: Reduced Velum: Within Functional Limits Mandible: Within Functional Limits Motor Speech Overall Motor Speech: Impaired Respiration: Within functional limits Phonation: Normal Resonance: Hypernasality Articulation: Impaired Level of Impairment: Phrase Intelligibility: Intelligibility reduced Word: 75-100% accurate Phrase: 50-74% accurate Sentence: 50-74% accurate Conversation: Not tested Motor Planning: Not tested Motor Speech Errors: Not applicable Effective Techniques: Slow rate;Increased vocal intensity;Over-articulate  Care Tool Care Tool Cognition Ability to hear (with hearing aid or hearing appliances if normally used Ability to hear (with hearing aid or hearing appliances if normally  used): 0. Adequate - no difficulty in normal conservation, social interaction, listening to TV   Expression of Ideas and Wants Expression of Ideas and Wants: 3. Some difficulty - exhibits some difficulty with expressing needs and ideas (e.g, some words or finishing thoughts) or speech is not clear   Understanding Verbal and Non-Verbal Content Understanding Verbal and Non-Verbal Content: 4. Understands (complex and basic) - clear comprehension without cues or repetitions  Memory/Recall Ability Memory/Recall Ability : Current season;That he or she is in a hospital/hospital unit   PMSV  Assessment  PMSV Trial Intelligibility: Intelligibility reduced Word: 75-100% accurate Phrase: 50-74% accurate Sentence: 50-74% accurate Conversation: Not tested  Bedside Swallowing Assessment General Diet Prior to this Study: Dysphagia 3 (mechanical soft);Mildly thick liquids (Level 2, nectar thick) Respiratory Status: Room air History of Recent Intubation: No Behavior/Cognition: Alert;Cooperative Oral Cavity - Dentition: Adequate natural dentition;Missing dentition Self-Feeding Abilities: Needs set up Vision: Functional for self-feeding Patient Positioning: Upright in bed Volitional Cough: Strong Volitional Swallow: Able to elicit  Oral Care Assessment Oral Assessment  (WDL): Exceptions to WDL Lips: Asymmetrical Teeth: Missing (Comment) Tongue: Pink;Moist Mucous Membrane(s): Moist;Pink Saliva: Moist, saliva free flowing Level of Consciousness: Alert Is patient on any of following O2 devices?: None of the above Nutritional status: On thickened liquids Oral Assessment Risk : High Risk Ice Chips Ice chips: Within functional limits Presentation: Spoon Thin Liquid Thin Liquid: Impaired Presentation: Straw;Cup Oral Phase Impairments: Reduced labial seal;Reduced lingual movement/coordination Oral Phase Functional Implications: Right anterior spillage Pharyngeal  Phase Impairments: Cough -  Immediate;Cough - Delayed;Wet Vocal Quality Nectar Thick Nectar Thick Liquid: Within functional limits Presentation: Cup Honey Thick Honey Thick Liquid: Not tested Puree Puree: Within functional limits Solid Solid: Impaired Oral Phase Functional Implications: Oral residue;Right anterior spillage BSE Assessment Risk for Aspiration Impact on safety and function: Mild aspiration risk  Short Term Goals: Week 1: SLP Short Term Goal 1 (Week 1): Patient will consume current diet without overt s/s of aspiration with supervision level verbal cues for use of swallowing compensatory strategies. SLP Short Term Goal 2 (Week 1): Patient will demonstrate 80% intelligibilty at the sentence level with min A verbal cues for use of speech intelligibility strategies. SLP Short Term Goal 3 (Week 1): Pt will utilize compensatory word-finding strategies to repair communication breakdowns with >80% accuracy given min cues. SLP Short Term Goal 4 (Week 1): Patient will consume thin liquid trials without overt s/s of aspiration with min A level verbal cues for use of swallowing compensatory strategies.  Refer to Care Plan for Long Term Goals  Recommendations for other services: None   Discharge Criteria: Patient will be discharged from SLP if patient refuses treatment 3 consecutive times without medical reason, if treatment goals not met, if there is a change in medical status, if patient makes no progress towards goals or if patient is discharged from hospital.  The above assessment, treatment plan, treatment alternatives and goals were discussed and mutually agreed upon: by patient  Renaee Munda 06/26/2022, 5:17 PM

## 2022-06-26 NOTE — Progress Notes (Signed)
Occupational Therapy Session Note  Patient Details  Name: Jimmy Valentine MRN: [id number] Date of Birth: 19-Dec-1950  Today's Date: 06/26/2022 OT Individual Time: 4098-1191 OT Individual Time Calculation (min): 90 min    Short Term Goals: Week 1:  OT Short Term Goal 1 (Week 1): Pt will be able to sit EOB with close S and don shirt with mod A or less. OT Short Term Goal 2 (Week 1): Pt will be able to sit to stand from EOB with max A of 1 to  prep for LB dressing. OT Short Term Goal 3 (Week 1): Pt will complete squat pivot to toilet with max A of 1. OT Short Term Goal 4 (Week 1): Pt will be able to perform gentle self ROM on RUE with min A.  Skilled Therapeutic Interventions/Progress Updates:    Pt received in bed ready for therapy.  Focus of therapy session on ADL training at shower level to learn hemiplegic techniques and focus on midline postural control.       ADL Retraining: - mod A UB bathing, max A LB bathing in shower with assist to stedy balance, lift R arm and leg -total A with brief and pants, using stedy lift to stand to get pants over hips -max A with shirt as pt started to learn hemiplegic techniques  -total with pants using stedy for support in standing (+2 A from RN to guide pt to midline as he was leaning to his R in stedy as OT adjusted briefs and pants over hips)    Transfers: -supine to sit EOB max A -sit to stand in STEDY with min A support for R arm from EOB, shower bench, and wc needing mod A to eccentrically sit with control   Balance/NMR: -sitting EOB and on tub bench with min to mod A to maintain midline, able to hold with supervision when L hand on bed rail or grab bar.  -placed pt in stedy in front of mirror for visual feedback for midline   Neuromuscular Re-Education:  -estim with Empi unit on small muscle atrophy setting. Stimulation on  R deltoids and R wrist and finger extension. Pt tolerated 15 min of estim with 22 intensity on shoulder and 25 on  forearm.  Pt focused on movement of hand with each stimulation evaluation.  Pt tolerated estim well with no adverse effects.   Pt resting in w/c with all needs met. Alarm set and call light in reach.    Therapy Documentation Precautions:  Precautions Precautions: Fall Precaution Comments: R hemi; dysarthria Restrictions Weight Bearing Restrictions: No    Vital Signs: Therapy Vitals Temp: 98.6 F (37 C) Temp Source: Oral Pulse Rate: 70 Resp: 18 BP: 125/76 Patient Position (if appropriate): Lying Oxygen Therapy SpO2: 97 % O2 Device: Room Air Pain: Pain Assessment Pain Scale: 0-10 Pain Score: 0-No pain ADL: ADL ADL Comments: total assist overall except for self feeding with set up   Therapy/Group: Individual Therapy  Chizara Mena 06/26/2022, 8:26 AM

## 2022-06-26 NOTE — Progress Notes (Signed)
Physical Therapy Session Note  Patient Details  Name: Jimmy Valentine MRN: [id number] Date of Birth: [birth year]  Today's Date: 06/26/2022 PT Individual Time: 1052-1203 PT Individual Time Calculation (min): 71 min   Short Term Goals: Week 1:  PT Short Term Goal 1 (Week 1): Pt will complete bed mobility with modA PT Short Term Goal 2 (Week 1): Pt will complete bed<>chair transfers with maxA of 1 person PT Short Term Goal 3 (Week 1): Pt will maintain unsupported sitting balance for 30 seconds PT Short Term Goal 4 (Week 1): Pt will participate in functional outcome measure to assess falls risk  Skilled Therapeutic Interventions/Progress Updates:  Patient seated upright in TIS w/c on entrance to room. Patient alert and agreeable to PT session. On first meeting with pt, allowed pt to relate personal acute medical hx and CLOF vs PLOF. All to build therapeutic alliance.   Patient with no pain complaint at start of session. Has new arm sling for use during mobility d/t pt's RUE weakness.   Therapeutic Activity: Transfers: Pt performed squat pivot transfer from w/c to mat table requiring verbal instructions and vc/ tc throughout as well as Mod/ MaxA to complete to L side.   Sit<>stand transfers during session with MaxA initially and improving to ModA +1 with +2 for safety/ balance upon standing. Improves in level of assist following instructions, vc/ tc and NMR.  At end of session, pt guided again in squat pivot transfer to L side from mat table to w/c. Allowed to perform with overall ModA +1 and +2 for safety CGA. He requires 2 large efforts/ attempts to reach and then several attempts to complete move to L side. MaxA for posterior pull of R hip toward back of w/c.  Neuromuscular Re-ed: NMR facilitated during session with focus on sitting and standing balance, midline orientation, motor control, muscle activation/ facilitation. Pt guided in seated balance initially with +2 providing safety in  balance. Challenged pt with cross body reach and ability to not only reach but also return to midline. With deviation far outside of BOS, pt requires ModA to return to midline following ant and R side LOB. Targets overhead to knee level from R<>L sides. Outside of BOS. Minimal quad activation noted in RLE.   Guided in sit<>stand using mirror for improved visual feedback to improve overall proprioception. Requires MaxA initially and improving to ModA +1 overall with +2 providing L sided support for UE. Noted R quad activation in standing but not able to perform open chain LAQ with no quad activation noted.  In stance, able to decrease level of physical block to R knee down to simple guard to prevent buckling but none noted. Pt is able to perform x6 minisquats in 2 standing bouts. Unable to fully extend R knee without Mod/ MaxA to TKE. RLE weaker than LLE.   During standing bouts, provided pressure to attachment point of L external rotators of hip in order to assist with sciatic nerve pain.   NMR performed for improvements in motor control and coordination, balance, sequencing, judgement, and self confidence/ efficacy in performing all aspects of mobility at highest level of independence.   Patient seated upright in TIS w/c with slight tilt applied at end of session with brakes locked, safety belt alarm set, and all needs within reach. Set up for lunch arrival and oriented to time and time of next therapy session.    Therapy Documentation Precautions:  Precautions Precautions: Fall Precaution Comments: R hemi; dysarthria Restrictions Weight Bearing  Restrictions: No General:   Vital Signs: Therapy Vitals Temp: 97.6 F (36.4 C) Temp Source: Oral Pulse Rate: 76 Resp: 17 BP: (!) 145/73 Patient Position (if appropriate): Lying Oxygen Therapy SpO2: 96 % O2 Device: Room Air Pain: Pain Assessment Pain Scale: 0-10 Pain Score: 0-No pain Mobility:   Locomotion :    Trunk/Postural  Assessment :    Balance: Balance Balance Assessed: Yes Standardized Balance Assessment Standardized Balance Assessment: PASS Postural Assessment Scale for Stroke Patients=PASS 1. Sitting Without Support: Can sit for more than 10 seconds without support 2. Standing With Support: Can stand with strong support of 2 people 3. Standing Without Support: Cannot stand without support 4.Standing on Nonparetic Leg: Can stand on nonparetic leg for a few seconds 5.Standing on Paretic Leg: Cannot stand on paretic leg MAINTAINING POSTURE SUBTOTAL: 4 6. Supine to Paretic Side Lateral: Can perform with much help 7. Supine to Nonparetic Side Lateral: Cannot perform 8. Supine to Sitting Up on the Edge of the Mat: Can perform with much help 9. Sitting on the Edge of the Mat to Supine: Cannot perform 10. Sitting to Standing Up: Can perform with much help 11. Standing Up to Sitting Down: Can perform with much help 12. Standing,Picking Up a Pencil from the Floor: Cannot perform CHANGING POSTURE SUBTOTAL: 4 PASS TOTAL SCORE: 8 Exercises:   Other Treatments:      Therapy/Group: Individual Therapy  Loel Dubonnet PT, DPT, CSRS 06/26/2022, 5:21 PM

## 2022-06-26 NOTE — Plan of Care (Signed)
  Problem: RH Swallowing Goal: LTG Patient will consume least restrictive diet using compensatory strategies with assistance (SLP) Description: LTG:  Patient will consume least restrictive diet using compensatory strategies with assistance (SLP) Flowsheets (Taken 06/26/2022 1735) LTG: Pt Patient will consume least restrictive diet using compensatory strategies with assistance of (SLP): Supervision Goal: LTG Patient will participate in dysphagia therapy to increase swallow function with assistance (SLP) Description: LTG:  Patient will participate in dysphagia therapy to increase swallow function with assistance (SLP) Flowsheets (Taken 06/26/2022 1735) LTG: Pt will participate in dysphagia therapy to increase swallow function with assistance of (SLP): Supervision Goal: LTG Pt will demonstrate functional change in swallow as evidenced by bedside/clinical objective assessment (SLP) Description: LTG: Patient will demonstrate functional change in swallow as evidenced by bedside/clinical objective assessment (SLP) Flowsheets (Taken 06/26/2022 1735) LTG: Patient will demonstrate functional change in swallow as evidenced by bedside/clinical objective assessment: Oropharyngeal swallow   Problem: RH Expression Communication Goal: LTG Patient will increase speech intelligibility (SLP) Description: LTG: Patient will increase speech intelligibility at word/phrase/conversation level with cues, % of the time (SLP) Flowsheets (Taken 06/26/2022 1735) LTG: Patient will increase speech intelligibility (SLP): Supervision Level: Conversation level Goal: LTG Patient will increase word finding of common (SLP) Description: LTG:  Patient will increase word finding of common objects/daily info/abstract thoughts with cues using compensatory strategies (SLP). Flowsheets (Taken 06/26/2022 1735) LTG: Patient will increase word finding of common (SLP): Modified Independent Patient will use compensatory strategies to increase  word finding of: Abstract thoughts

## 2022-06-26 NOTE — Progress Notes (Signed)
PROGRESS NOTE   Subjective/Complaints:  Dopplers reviewed  Pt aphasic but answers Y/N appropriately   ROS- neg CP, SOB, N/V, + constipation   Objective:   VAS [place] LOWER EXTREMITY VENOUS (DVT)  Result Date: 06/25/2022  Lower Venous DVT Study Patient Name:  Jimmy Valentine  Date of Exam:   06/25/2022 Medical Rec #: [id number]        Accession #:    [id number] Date of Birth: 1950/05/13        Patient Gender: M Patient Age:   72 years Exam Location:  [facility] Procedure:      VAS [place] LOWER EXTREMITY VENOUS (DVT) Referring Phys: Wendi Maya --------------------------------------------------------------------------------  Indications: Stroke, and Rehabilitation.  Risk Factors: Immobility. Limitations: Snoring/apnea. Comparison Study: No prior study on file Performing Technologist: Sherren Kerns RVS  Examination Guidelines: A complete evaluation includes B-mode imaging, spectral Doppler, color Doppler, and power Doppler as needed of all accessible portions of each vessel. Bilateral testing is considered an integral part of a complete examination. Limited examinations for reoccurring indications may be performed as noted. The reflux portion of the exam is performed with the patient in reverse Trendelenburg.  +---------+---------------+---------+-----------+----------+--------------+ RIGHT    CompressibilityPhasicitySpontaneityPropertiesThrombus Aging +---------+---------------+---------+-----------+----------+--------------+ CFV      Full           Yes      Yes                                 +---------+---------------+---------+-----------+----------+--------------+ SFJ      Full                                                        +---------+---------------+---------+-----------+----------+--------------+ FV Prox  Full                                                         +---------+---------------+---------+-----------+----------+--------------+ FV Mid   Full           Yes      Yes                                 +---------+---------------+---------+-----------+----------+--------------+ FV DistalFull                                                        +---------+---------------+---------+-----------+----------+--------------+ PFV      Full                                                        +---------+---------------+---------+-----------+----------+--------------+  POP      Full           Yes      Yes                                 +---------+---------------+---------+-----------+----------+--------------+ PTV      Full                                                        +---------+---------------+---------+-----------+----------+--------------+ PERO     Full                                                        +---------+---------------+---------+-----------+----------+--------------+   +---------+---------------+---------+-----------+----------+--------------+ LEFT     CompressibilityPhasicitySpontaneityPropertiesThrombus Aging +---------+---------------+---------+-----------+----------+--------------+ CFV      Full           Yes      Yes                                 +---------+---------------+---------+-----------+----------+--------------+ SFJ      Full                                                        +---------+---------------+---------+-----------+----------+--------------+ FV Prox  Full                                                        +---------+---------------+---------+-----------+----------+--------------+ FV Mid   Full           Yes      Yes                                 +---------+---------------+---------+-----------+----------+--------------+ FV DistalFull                                                         +---------+---------------+---------+-----------+----------+--------------+ PFV      Full                                                        +---------+---------------+---------+-----------+----------+--------------+ POP      Full           Yes      Yes                                 +---------+---------------+---------+-----------+----------+--------------+  PTV      Full                                                        +---------+---------------+---------+-----------+----------+--------------+ PERO     Full                                                        +---------+---------------+---------+-----------+----------+--------------+     Summary: BILATERAL: - No evidence of deep vein thrombosis seen in the lower extremities, bilaterally. -No evidence of popliteal cyst, bilaterally.   *See table(s) above for measurements and observations. Electronically signed by Heath Lark on 06/25/2022 at 5:43:18 PM.    Final    Recent Labs    06/25/22 0554  WBC 8.7  HGB 16.4  HCT 44.8  PLT 215    Recent Labs    06/25/22 0554  NA 128*  K 4.2  CL 95*  CO2 21*  GLUCOSE 151*  BUN 17  CREATININE 1.02  CALCIUM 8.8*     Intake/Output Summary (Last 24 hours) at 06/26/2022 0833 Last data filed at 06/26/2022 0813 Gross per 24 hour  Intake 597 ml  Output 300 ml  Net 297 ml         Physical Exam: Vital Signs Blood pressure 125/76, pulse 70, temperature 98.6 F (37 C), temperature source Oral, resp. rate 18, height 5\' 9"  (1.753 m), weight 99.8 kg, SpO2 97 %. Aphasic, exp> recept , follows simple commands  General: No acute distress Mood and affect are appropriate Heart: Regular rate and rhythm no rubs murmurs or extra sounds Lungs: Clear to auscultation, breathing unlabored, no rales or wheezes Abdomen: Positive bowel sounds, soft nontender to palpation, nondistended Extremities: No clubbing, cyanosis,mild dorsum RIght hand edema  Skin: No evidence of  breakdown, no evidence of rash Neurologic: Cranial nerves II through XII intact, motor strength is 5/5 in left and 0/5 right  deltoid, bicep, tricep, grip, hip flexor, knee extensors, ankle dorsiflexor and plantar flexor Sensory exam normal sensation to light touch and proprioception in bilateral upper and lower extremities Cerebellar exam weakness on right precludes testing  Musculoskeletal: Full range of motion in all 4 extremities. No joint swelling,     Assessment/Plan: 1. Functional deficits which require 3+ hours per day of interdisciplinary therapy in a comprehensive inpatient rehab setting. Physiatrist is providing close team supervision and 24 hour management of active medical problems listed below. Physiatrist and rehab team continue to assess barriers to discharge/monitor patient progress toward functional and medical goals  Care Tool:  Bathing    Body parts bathed by patient: Chest, Face, Abdomen   Body parts bathed by helper: Right arm, Left arm, Front perineal area, Buttocks, Right upper leg, Left upper leg, Left lower leg, Right lower leg     Bathing assist Assist Level: Total Assistance - Patient < 25%     Upper Body Dressing/Undressing Upper body dressing   What is the patient wearing?: Pull over shirt    Upper body assist Assist Level: Total Assistance - Patient < 25%    Lower Body Dressing/Undressing Lower body dressing      What is the patient wearing?: Pants,  Incontinence brief     Lower body assist Assist for lower body dressing: Total Assistance - Patient < 25%     Toileting Toileting    Toileting assist Assist for toileting: 2 Helpers     Transfers Chair/bed transfer  Transfers assist     Chair/bed transfer assist level: 2 Helpers     Locomotion Ambulation   Ambulation assist   Ambulation activity did not occur: Safety/medical concerns          Walk 10 feet activity   Assist  Walk 10 feet activity did not occur:  Safety/medical concerns        Walk 50 feet activity   Assist Walk 50 feet with 2 turns activity did not occur: Safety/medical concerns         Walk 150 feet activity   Assist Walk 150 feet activity did not occur: Safety/medical concerns         Walk 10 feet on uneven surface  activity   Assist Walk 10 feet on uneven surfaces activity did not occur: Safety/medical concerns         Wheelchair     Assist Is the patient using a wheelchair?: Yes Type of Wheelchair: Manual    Wheelchair assist level: Dependent - Patient 0%      Wheelchair 50 feet with 2 turns activity    Assist        Assist Level: Dependent - Patient 0%   Wheelchair 150 feet activity     Assist      Assist Level: Dependent - Patient 0%   Blood pressure 125/76, pulse 70, temperature 98.6 F (37 C), temperature source Oral, resp. rate 18, height 5\' 9"  (1.753 m), weight 99.8 kg, SpO2 97 %.  Medical Problem List and Plan: 1. 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 Large basal ganglia infarct. 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              -patient may  shower             -ELOS/Goals: 21-24 days             -Continue WHO and PRAFO             -Admit to CIR   2.  Antithrombotics: -DVT/anticoagulation:  Pharmaceutical: Lovenox 4omg daily              -antiplatelet therapy: Aspirin and Plavix for three months followed by aspirin alone (started 5/20)   3. Pain Management: Tylenol, Robaxin as needed   4. Mood/Behavior/Sleep: LCSW to evaluate and provide emotional support             -antipsychotic agents: n/a             -trazodone as needed   5. Neuropsych/cognition: This patient is capable of making decisions on his own behalf.   6. Skin/Wound Care: Routine skin care checks   7. Fluids/Electrolytes/Nutrition:  Routine Is and Os and follow-up chemistries             -continue dys3/nectar thick; SLP eval   8: Hypertension: monitor TID and prn (home meds: amlodipine 10 mg QD, valsartan 320 mg daily)             -continue irbesartan 37.5 mg daily Vitals:   06/25/22 2023 06/26/22 0540  BP: (!) 172/83 125/76  Pulse: 82 70  Resp: 17 18  Temp: 98.3 F (36.8 C) 98.6 F (37 C)  SpO2: 91% 97%   Will need to keep BPs on higher side due to intracranial stenosis    9: Hyperlipidemia: continue high intensity statin, change simvastatin to atorvastatin 80mg  daily   10: Hyponatremia, mild Na 132: follow-up BMP, will fluid restrict - recheck on 5/27   11: History of gout: restart home colchicine   12: DM type II: HGB A1C 6.7, CBGs QID and SSI; carb  modified diet when advanced (home meds include metformin 1000 mg BID) CBG (last 3)  Recent Labs    06/25/22 1605 06/25/22 2138 06/26/22 0544  GLUCAP 144* 197* 167*    Adequate control monitor 5/24   13: RLE/RUE weakness:normal LE  venous duplex   14: Tobacco use: occasional cigars, advise cessation   15. Constipation             -Miralax daily,add Senna S , sorbitol if no results today     LOS: 2 days A FACE TO FACE EVALUATION WAS PERFORMED  Erick Colace 06/26/2022, 8:33 AM

## 2022-06-26 NOTE — IPOC Note (Signed)
Overall Plan of Care [facility]) Patient Details Name: Jimmy Valentine MRN: [id number] DOB: [birth year]  Admitting Diagnosis: Acute ischemic left MCA stroke [facility])  Hospital Problems: Principal Problem:   Acute ischemic left MCA stroke [facility])     Functional Problem List: Nursing Safety, Bladder, Bowel, Sensory, Edema, Endurance, Medication Management, Perception  PT Balance, Endurance, Motor, Nutrition, Safety, Skin Integrity  OT Balance, Endurance, Motor, Pain, Perception  SLP    TR         Basic ADL's: OT Bathing, Dressing, Toileting, Grooming     Advanced  ADL's: OT       Transfers: PT Bed Mobility, Bed to Chair, Car  OT Toilet, Tub/Shower     Locomotion: PT Ambulation, Stairs     Additional Impairments: OT Fuctional Use of Upper Extremity  SLP        TR      Anticipated Outcomes Item Anticipated Outcome  Self Feeding independent  Swallowing      Basic self-care  Min A  Toileting  Min A   Bathroom Transfers Min A  Bowel/Bladder  continent B/B  Transfers  minA  Locomotion  minA but likely wheelchair level  Communication     Cognition     Pain  less than 3  Safety/Judgment  fall free   Therapy Plan: PT Intensity: Minimum of 1-2 x/day ,45 to 90 minutes PT Frequency: 5 out of 7 days PT Duration Estimated Length of Stay: 4 weeks OT Intensity: Minimum of 1-2 x/day, 45 to 90 minutes OT Frequency: 5 out of 7 days OT Duration/Estimated Length of Stay: 28-30 days     Team Interventions: Nursing Interventions Patient/Family Education, Medication Management, Psychosocial Support, Bladder Management, Bowel Management, Cognitive Remediation/Compensation, Disease Management/Prevention, Discharge Planning  PT interventions Ambulation/gait training, Balance/vestibular training, Cognitive remediation/compensation, Community reintegration, Discharge planning, Disease management/prevention, DME/adaptive equipment instruction, Functional electrical stimulation,  Functional mobility training, Neuromuscular re-education, Pain management, Patient/family education, Psychosocial support, Skin care/wound management, Splinting/orthotics, Stair training, Therapeutic Activities, Therapeutic Exercise, UE/LE Strength taining/ROM, UE/LE Coordination activities, Visual/perceptual remediation/compensation, Wheelchair propulsion/positioning  OT Interventions Balance/vestibular training, Discharge planning, Functional mobility training, Self Care/advanced ADL retraining, Therapeutic Exercise, Therapeutic Activities, UE/LE Strength taining/ROM, UE/LE Coordination activities, Patient/family education, Psychosocial support, DME/adaptive equipment instruction, Neuromuscular re-education, Visual/perceptual remediation/compensation, Pain management  SLP Interventions    TR Interventions    SW/CM Interventions Discharge Planning, Psychosocial Support, Patient/Family Education, Disease Management/Prevention   Barriers to Discharge MD   Communication   Nursing Decreased caregiver support, Home environment access/layout, Lack of/limited family support, Insurance for SNF coverage, Weight, Weight bearing restrictions may stay with a family member at discharge  PT Decreased caregiver support, Home environment access/layout, Lack of/limited family support, Community education officer for SNF coverage, Weight, Nutrition means    OT Inaccessible home environment 4 stairs  SLP      SW Lack of/limited family support, Community education officer for SNF coverage, Decreased caregiver support     Team Discharge Planning: Destination: PT-Home ,OT- Home , SLP-  Projected Follow-up: PT-Home health PT, 24 hour supervision/assistance, OT-  Home health OT, SLP-  Projected Equipment Needs: PT-To be determined, OT- To be determined, SLP-  Equipment Details: PT- , OT-  Patient/family involved in discharge planning: PT- Patient,  OT-Patient, SLP-   MD ELOS: 21-24d Medical Rehab Prognosis:  Good Assessment: The patient has been  admitted for CIR therapies with the diagnosis of Left MCA infarct . The team will be addressing functional mobility, strength, stamina, balance, safety, adaptive techniques and equipment, self-care, bowel and bladder mgt,  patient and caregiver education, Clinical cytogeneticist . Goals have been set at Min A. Anticipated discharge destination is Home .        See Team Conference Notes for weekly updates to the plan of care

## 2022-06-27 DIAGNOSIS — I63512 Cerebral infarction due to unspecified occlusion or stenosis of left middle cerebral artery: Secondary | ICD-10-CM | POA: Diagnosis not present

## 2022-06-27 LAB — GLUCOSE, CAPILLARY
Glucose-Capillary: 172 mg/dL — ABNORMAL HIGH (ref 70–99)
Glucose-Capillary: 182 mg/dL — ABNORMAL HIGH (ref 70–99)
Glucose-Capillary: 177 mg/dL — ABNORMAL HIGH (ref 70–99)
Glucose-Capillary: 183 mg/dL — ABNORMAL HIGH (ref 70–99)

## 2022-06-27 NOTE — Progress Notes (Signed)
Physical Therapy Session Note  Patient Details  Name: Jimmy Valentine MRN: [id number] Date of Birth: July 08, 1950  Today's Date: 06/27/2022 PT Individual Time: 6295-2841 PT Individual Time Calculation (min): 40 min   Short Term Goals: Week 1:  PT Short Term Goal 1 (Week 1): Pt will complete bed mobility with modA PT Short Term Goal 2 (Week 1): Pt will complete bed<>chair transfers with maxA of 1 person PT Short Term Goal 3 (Week 1): Pt will maintain unsupported sitting balance for 30 seconds PT Short Term Goal 4 (Week 1): Pt will participate in functional outcome measure to assess falls risk  Skilled Therapeutic Interventions/Progress Updates:    Pt received supine in bed awake and eager to participate in therapy session. Supine>sitting R EOB, HOB partially elevated and bedrails available, with heavy mod/light max assist for bringing trunk upright and R LE management. Pt already wearing sneakers. L lateral scoot/squat pivot transfer EOB>TIS w/c with heavy mod assist for lifting/pivoting hips with cuing for increased anterior trunk lean and therapist providing facilitation to block R knee and promote increased WBing.  Transported to/from gym in w/c for time management and energy conservation.  Sit>stands TIS w/c>L UE support on hallway rail or to 2nd person HHA with R UE support around therapist's shoulders from heavy mod to lighter mod assist for balance support while rising and therapist blocking R knee and promoting increased R hip/knee extension.   Donned R LE DF assist ACE wrap and leg loop for improved swing phase gait mechanics and facilitation.  Gait training 50ft x2  using L UE support on hallway rail with mirror feedback and mod assist for balance and R LE management. Pt demonstrating the following gait deviations with therapist providing the described cuing and facilitation for improvement:  - demos activation of R quads during stance requiring mod assist guarding for buckling -  tactile,verbal, visual cuing for L weight shift onto L stance to improve facilitation for R swing advancement - pt able to initiate R swing, but noticed  RLE stays in excessive knee extension, possible tone? throughout swing - requires max/total assist to advance R LE during swing - intermittent cuing to maintain trunk/hip extension for upright posture, especially with fatigue - able to consistently achieve reciprocal stepping pattern  Transitioned to gait training 44ft with +2 L HHA and R UE support around therapist's shoulders with +2 providing min assist and therapist providing mod assist for balance and R LE management as described above. Continued to provide mirror feedback as pt utilizes it for improved upright posture and weight shifting.  Transported back to room and pt requesting to return to bed. R squat pivot TIS w/c>EOB with heavier mod assist transferring this direction, but improving, continued cuing for head/hips relationship and increased anterior trunk lean.   Sit>supine, HOB partially elevated and using bedrails, with mod assist for R hemibody management onto bed. Pt left supine in bed with needs in reach R UE supported on pillows, R LE PRAFO donned with eduction on its purpose (pt requesting to try it out), and bed alarm on.  Therapy Documentation Precautions:  Precautions Precautions: Fall Precaution Comments: R hemi; dysarthria Restrictions Weight Bearing Restrictions: No   Pain:  No reports of pain throughout session.    Therapy/Group: Individual Therapy   Ginny Forth , PT, DPT, NCS, CSRS 06/27/2022, 8:04 AM

## 2022-06-27 NOTE — Progress Notes (Signed)
Occupational Therapy Session Note  Patient Details  Name: Jimmy Valentine MRN: [id number] Date of Birth: 08/23/1950  Today's Date: 06/27/2022 OT Individual Time: 1345-1430 OT Individual Time Calculation (min): 45 min (unattended estim 1430-1530)   Short Term Goals: Week 1:  OT Short Term Goal 1 (Week 1): Pt will be able to sit EOB with close S and don shirt with mod A or less. OT Short Term Goal 2 (Week 1): Pt will be able to sit to stand from EOB with max A of 1 to  prep for LB dressing. OT Short Term Goal 3 (Week 1): Pt will complete squat pivot to toilet with max A of 1. OT Short Term Goal 4 (Week 1): Pt will be able to perform gentle self ROM on RUE with min A.  Skilled Therapeutic Interventions/Progress Updates:    Pt received in w/c ready stating he had been waiting over 15 minutes for staff to assist him to the toilet. "I pressed the call light a long time ago and now I am wet".  Arm sling donned to support flaccid UE. With +2 A from rehab tech, pt assisted to toilet using STEDY lift.  Initially pt leaning heavily to the R and having great difficulty holding his stand in the stedy as he was not extending his R leg.  Pt completed at least 8 sit to stands in stedy and holding balance progressing from max with balance to light CGA as he learned how to find and hold his midline position.  In the bathroom, his briefs were removed and soaked with urine. Pt requested to sit to have a bowel movement. Pt was able to have one (informed RN) and pt assisted with cleansing and clothing management.   Continued practice with standing at mirror using stedy for support. Pt requested to lay down prior to PT session due to sciatica pain.  Pt positioned in bed with pillows supporting his RUE.   Pt agreeable to estim and having unit on his hand for 60 minutes. Showed pt how to turn unit off himself should his muscles feel too fatigued.  Used Empi unit on small muscle atrophy setting on R deltoid (25 intensity)  and R forearm (27 intensity) with 5 min on and off for 60 min of unattended estim.  Pt understood goal was to concentrate on the sensation of contractions and seeing movement of fingers.  OT returned 1 hour later to doff unit. Pt stated he tolerated it well and 60  min cycle just ending.    Pt resting in bed with all needs met and alarm set.           Therapy Documentation Precautions:  Precautions Precautions: Fall Precaution Comments: R hemi; dysarthria Restrictions Weight Bearing Restrictions: No   Pain: Pain Assessment Pain Scale: 0-10 Pain Score: 5  Pain Location: Leg Pain Orientation: Left Pain Intervention(s): Repositioned    Therapy/Group: Individual Therapy  Toretto Tingler 06/27/2022, 10:36 AM

## 2022-06-27 NOTE — Progress Notes (Signed)
PROGRESS NOTE   Subjective/Complaints:  No pains , still unable to move the right side No BM x 6 d ROS- neg CP, SOB, N/V, + constipation   Objective:   VAS [place] LOWER EXTREMITY VENOUS (DVT)  Result Date: 06/25/2022  Lower Venous DVT Study Patient Name:  Jimmy Valentine  Date of Exam:   06/25/2022 Medical Rec #: [id number]        Accession #:    [id number] Date of Birth: 07/29/1950        Patient Gender: M Patient Age:   72 years Exam Location:  [facility] Procedure:      VAS [place] LOWER EXTREMITY VENOUS (DVT) Referring Phys: Wendi Maya --------------------------------------------------------------------------------  Indications: Stroke, and Rehabilitation.  Risk Factors: Immobility. Limitations: Snoring/apnea. Comparison Study: No prior study on file Performing Technologist: Sherren Kerns RVS  Examination Guidelines: A complete evaluation includes B-mode imaging, spectral Doppler, color Doppler, and power Doppler as needed of all accessible portions of each vessel. Bilateral testing is considered an integral part of a complete examination. Limited examinations for reoccurring indications may be performed as noted. The reflux portion of the exam is performed with the patient in reverse Trendelenburg.  +---------+---------------+---------+-----------+----------+--------------+ RIGHT    CompressibilityPhasicitySpontaneityPropertiesThrombus Aging +---------+---------------+---------+-----------+----------+--------------+ CFV      Full           Yes      Yes                                 +---------+---------------+---------+-----------+----------+--------------+ SFJ      Full                                                        +---------+---------------+---------+-----------+----------+--------------+ FV Prox  Full                                                         +---------+---------------+---------+-----------+----------+--------------+ FV Mid   Full           Yes      Yes                                 +---------+---------------+---------+-----------+----------+--------------+ FV DistalFull                                                        +---------+---------------+---------+-----------+----------+--------------+ PFV      Full                                                        +---------+---------------+---------+-----------+----------+--------------+  POP      Full           Yes      Yes                                 +---------+---------------+---------+-----------+----------+--------------+ PTV      Full                                                        +---------+---------------+---------+-----------+----------+--------------+ PERO     Full                                                        +---------+---------------+---------+-----------+----------+--------------+   +---------+---------------+---------+-----------+----------+--------------+ LEFT     CompressibilityPhasicitySpontaneityPropertiesThrombus Aging +---------+---------------+---------+-----------+----------+--------------+ CFV      Full           Yes      Yes                                 +---------+---------------+---------+-----------+----------+--------------+ SFJ      Full                                                        +---------+---------------+---------+-----------+----------+--------------+ FV Prox  Full                                                        +---------+---------------+---------+-----------+----------+--------------+ FV Mid   Full           Yes      Yes                                 +---------+---------------+---------+-----------+----------+--------------+ FV DistalFull                                                         +---------+---------------+---------+-----------+----------+--------------+ PFV      Full                                                        +---------+---------------+---------+-----------+----------+--------------+ POP      Full           Yes      Yes                                 +---------+---------------+---------+-----------+----------+--------------+  PTV      Full                                                        +---------+---------------+---------+-----------+----------+--------------+ PERO     Full                                                        +---------+---------------+---------+-----------+----------+--------------+     Summary: BILATERAL: - No evidence of deep vein thrombosis seen in the lower extremities, bilaterally. -No evidence of popliteal cyst, bilaterally.   *See table(s) above for measurements and observations. Electronically signed by Heath Lark on 06/25/2022 at 5:43:18 PM.    Final    Recent Labs    06/25/22 0554  WBC 8.7  HGB 16.4  HCT 44.8  PLT 215    Recent Labs    06/25/22 0554  NA 128*  K 4.2  CL 95*  CO2 21*  GLUCOSE 151*  BUN 17  CREATININE 1.02  CALCIUM 8.8*     Intake/Output Summary (Last 24 hours) at 06/27/2022 1033 Last data filed at 06/27/2022 0825 Gross per 24 hour  Intake 592 ml  Output 240 ml  Net 352 ml         Physical Exam: Vital Signs Blood pressure (!) 150/75, pulse (!) 58, temperature 98 F (36.7 C), temperature source Oral, resp. rate 19, height 5\' 9"  (1.753 m), weight 99.8 kg, SpO2 100 %. Aphasic, exp> recept , follows simple commands  General: No acute distress Mood and affect are appropriate Heart: Regular rate and rhythm no rubs murmurs or extra sounds Lungs: Clear to auscultation, breathing unlabored, no rales or wheezes Abdomen: Positive bowel sounds, soft nontender to palpation, nondistended Extremities: No clubbing, cyanosis,mild dorsum RIght hand edema  Skin: No evidence  of breakdown, no evidence of rash Neurologic: Cranial nerves II through XII intact, motor strength is 5/5 in left and 0/5 right  deltoid, bicep, tricep, grip, hip flexor, knee extensors, ankle dorsiflexor and plantar flexor Sensory exam normal sensation to light touch and proprioception in bilateral upper and lower extremities Cerebellar exam weakness on right precludes testing  Musculoskeletal: Full range of motion in all 4 extremities. No joint swelling,     Assessment/Plan: 1. Functional deficits which require 3+ hours per day of interdisciplinary therapy in a comprehensive inpatient rehab setting. Physiatrist is providing close team supervision and 24 hour management of active medical problems listed below. Physiatrist and rehab team continue to assess barriers to discharge/monitor patient progress toward functional and medical goals  Care Tool:  Bathing    Body parts bathed by patient: Chest, Abdomen, Front perineal area, Right upper leg, Face, Left upper leg   Body parts bathed by helper: Right arm, Left arm, Front perineal area, Buttocks, Right upper leg, Left upper leg, Left lower leg, Right lower leg     Bathing assist Assist Level: Maximal Assistance - Patient 24 - 49%     Upper Body Dressing/Undressing Upper body dressing   What is the patient wearing?: Pull over shirt    Upper body assist Assist Level: Maximal Assistance - Patient 25 - 49%    Lower Body Dressing/Undressing Lower  body dressing      What is the patient wearing?: Pants, Incontinence brief     Lower body assist Assist for lower body dressing: Total Assistance - Patient < 25%     Toileting Toileting    Toileting assist Assist for toileting: 2 Helpers     Transfers Chair/bed transfer  Transfers assist  Chair/bed transfer activity did not occur: Safety/medical concerns  Chair/bed transfer assist level: 2 Helpers     Locomotion Ambulation   Ambulation assist   Ambulation activity did  not occur: Safety/medical concerns          Walk 10 feet activity   Assist  Walk 10 feet activity did not occur: Safety/medical concerns        Walk 50 feet activity   Assist Walk 50 feet with 2 turns activity did not occur: Safety/medical concerns         Walk 150 feet activity   Assist Walk 150 feet activity did not occur: Safety/medical concerns         Walk 10 feet on uneven surface  activity   Assist Walk 10 feet on uneven surfaces activity did not occur: Safety/medical concerns         Wheelchair     Assist Is the patient using a wheelchair?: Yes Type of Wheelchair: Manual    Wheelchair assist level: Dependent - Patient 0%      Wheelchair 50 feet with 2 turns activity    Assist        Assist Level: Dependent - Patient 0%   Wheelchair 150 feet activity     Assist      Assist Level: Dependent - Patient 0%   Blood pressure (!) 150/75, pulse (!) 58, temperature 98 F (36.7 C), temperature source Oral, resp. rate 19, height 5\' 9"  (1.753 m), weight 99.8 kg, SpO2 100 %.  Medical Problem List and Plan: 1. 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 Large basal ganglia infarct. 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              -patient may  shower             -ELOS/Goals: 21-24 days             -Continue WHO and PRAFO             -Admit to CIR   2.  Antithrombotics: -DVT/anticoagulation:  Pharmaceutical: Lovenox 4omg daily              -antiplatelet therapy: Aspirin and Plavix for three months followed by aspirin alone (started 5/20)   3. Pain Management: Tylenol, Robaxin as needed   4. Mood/Behavior/Sleep: LCSW to evaluate and provide emotional support             -antipsychotic agents: n/a             -trazodone as needed   5. Neuropsych/cognition: This patient is capable  of making decisions on his own behalf.   6. Skin/Wound Care: Routine skin care checks   7. Fluids/Electrolytes/Nutrition: Routine Is and Os and follow-up chemistries             -continue dys3/nectar thick; SLP eval   8: Hypertension: monitor TID and prn (home meds: amlodipine 10 mg QD, valsartan 320 mg daily)             -continue irbesartan 37.5 mg  daily Vitals:   06/26/22 1951 06/27/22 0600  BP: (!) 140/81 (!) 150/75  Pulse: 76 (!) 58  Resp: 19   Temp: 97.6 F (36.4 C) 98 F (36.7 C)  SpO2: 97% 100%   Will need to keep BPs on higher side due to intracranial stenosis    9: Hyperlipidemia: continue high intensity statin, change simvastatin to atorvastatin 80mg  daily   10: Hyponatremia, mild Na 132: follow-up BMP, will fluid restrict - recheck on 5/27   11: History of gout: restart home colchicine   12: DM type II: HGB A1C 6.7, CBGs QID and SSI; carb  modified diet when advanced (home meds include metformin 1000 mg BID) CBG (last 3)  Recent Labs    06/26/22 1645 06/26/22 2210 06/27/22 0551  GLUCAP 169* 148* 172*    Adequate control monitor 5/25   13: RLE/RUE weakness:normal LE  venous duplex   14: Tobacco use: occasional cigars, advise cessation   15. Constipation             -Miralax daily,add Senna S , sorbitol today     LOS: 3 days A FACE TO FACE EVALUATION WAS PERFORMED  Erick Colace 06/27/2022, 10:33 AM

## 2022-06-27 NOTE — Progress Notes (Signed)
Speech Language Pathology Daily Session Note  Patient Details  Name: Jimmy Valentine MRN: [id number] Date of Birth: [birth year]  Today's Date: 06/27/2022 SLP Individual Time: 0900-0947 SLP Individual Time Calculation (min): 47 min  Short Term Goals: Week 1: SLP Short Term Goal 1 (Week 1): Patient will consume current diet without overt s/s of aspiration with supervision level verbal cues for use of swallowing compensatory strategies. SLP Short Term Goal 2 (Week 1): Patient will demonstrate 80% intelligibilty at the sentence level with min A verbal cues for use of speech intelligibility strategies. SLP Short Term Goal 3 (Week 1): Pt will utilize compensatory word-finding strategies to repair communication breakdowns with >80% accuracy given min cues. SLP Short Term Goal 4 (Week 1): Patient will consume thin liquid trials without overt s/s of aspiration with min A level verbal cues for use of swallowing compensatory strategies.  Skilled Therapeutic Interventions:  Pt was seen in am to address dysphagia management and speech intelligibility. Pt was alert and seen at bedside. He was repositioned upright for trials of thin liquids. Pt reported completing oral hygiene prior to this SLP's arrival. At baseline, pt observed with wet vocal quality. Pt was responsive to cues for cough and throat clear with good return however, ongoing nasality and intermittent wet vocal quality was observed throughout session. Pt reports receiving Robitussin to help with cough. Pt was presented with trials of thin liquid via small disposable measuring cup. Pt consumed x3 trials with no immediate s/sx pen/asp. SLP trialed thin liquids via straw sips due to significant anterior spillage on right side and decreased bolus control with cup edge sips. Pt consumed x3 trials without impairment and 1 trial with delayed cough after swallow. In x1 additional trial, pt placing straw at midline with anterior spillage to right decreased bolus  control and subsequent immediate cough. SLP recommended continue with nectar thick liquids. Pt may consume ice chips one at a time. In additional minutes of session, SLP addressed speech intelligibility. SLP provided and reviewed A BOSS speech strategies with focus on over articulation and separating words. SLP engaged pt in functional communication. Pt answered questions about his family, career, and PLOL. He benefited from min A for use of ove rarticulation and separating words with ~75% speech intelligibility. When prompted pt able to repeat word with improved over articulation and syllable separation. Pt was left at bedside with call button within reach and bead alarm active. SLP to continue POC.  Pain Pain Assessment Pain Scale: 0-10 Pain Score: 5  Pain Location: Leg Pain Orientation: Left Pain Intervention(s): Repositioned  Therapy/Group: Individual Therapy  Renaee Munda 06/27/2022, 9:58 AM

## 2022-06-27 NOTE — Progress Notes (Signed)
Physical Therapy Session Note  Patient Details  Name: Jimmy Valentine MRN: [id number] Date of Birth: [birth year]  Today's Date: 06/27/2022 PT Individual Time: 1105 - 1203 PT Individual Total Time: 58 min     Short Term Goals: Week 1:  PT Short Term Goal 1 (Week 1): Pt will complete bed mobility with modA PT Short Term Goal 2 (Week 1): Pt will complete bed<>chair transfers with maxA of 1 person PT Short Term Goal 3 (Week 1): Pt will maintain unsupported sitting balance for 30 seconds PT Short Term Goal 4 (Week 1): Pt will participate in functional outcome measure to assess falls risk  Skilled Therapeutic Interventions/Progress Updates: Patient supine in bed asleep and was able to awaken with verbal name call. Patient alert and agreeable to PT session.   Patient reported some pain (unrated) on L LE 2/2 sciatica, and that nursing adjusted pain earlier and it felt better.  Therapeutic Activity: Bed Mobility: Pt performed supine<R sidelying (minA)<L sidelying (heavy mod-maxA) for pericare 2/2 soiled brief. Use of powder used to prevent skin breakdown. Patient totalA to donn personal pants (pt cued to pull pants up after mid thigh to hips with L LE - total to finish donning on R hip. Patient required maxA for truncal elevation to get to EOB, and totalA to to get R LE off of bed Transfers: Pt performed squat pivot transfer to the L x 1 with modA + 2 in room for safety, and modA +1 to high/low mat from TIS. Patient required cues for anterior scoot to edge (maxA to advance R hip). Patient cued to lean on PTA shoulder to uphold head/hip relationship, and L hand placement to catch self. Patient min-modA to stand from TIS during gait trials with cues to scoot anteriorly with use of L UE.   Gait Training:  Pt ambulated about 24 feet with L UE on hand rail outside of main gym with PTA on R side facilitating knee block/advancement through swing phase. DF wrap donned on R LE, and patient noted with minA to  stand from TIS (slightly tilted forward) and cues to use L UE to push off arm rest. Patient provided with max cues to laterally weight shift to contralateral side of LE that was in swing phase (totalA to advance R LE). PTA providing anterior hip shifts in corresponding swing phase as well with PT facilitating cues (WC follow) for patient advance L LE, and to contract quads during stance phase. Patient performed well for first gait trial. Patient also noted to maintain truncal support with mod-max cues to keep trunk upright.   Neuromuscular Re-ed: NMR facilitated during session with focus on proprioceptive feedback on R LE, motor coordination, and weight shifting. - Patient performed lateral weight shifts with PT on paretic side (R) for knee block and to assist in facilitating knee extension, and then PTA on same side providing the same assistance. Patient required hip adjustment at first with modA to center per presentation of L hip drop and R hip lengthened. Patient noted to have R quad activation with pulling R pant leg up for visual feedback to patient. PTA released block on R LE for patient to cue self to extend knee when feeling buckling. Patient noted to required modA at first, but then was able to correct buckle to extension with light minA - supervision (PTA hand/knee close by for safety). Patient provided with rest break and performed 3 rounds of lateral weight shifting prior to ambulation with no time limit noted (until  patient fatigued and required rest break).   NMR performed for improvements in motor control and coordination, balance, sequencing, judgement, and self confidence/ efficacy in performing all aspects of mobility at highest level of independence.   Patient left in TIS at end of session with brakes locked, belt alarm set, and all needs within reach.      Therapy Documentation Precautions:  Precautions Precautions: Fall Precaution Comments: R hemi;  dysarthria Restrictions Weight Bearing Restrictions: No  Therapy/Group: Individual Therapy  Adriell Polansky PTA 06/27/2022, 7:57 AM

## 2022-06-28 LAB — GLUCOSE, CAPILLARY
Glucose-Capillary: 168 mg/dL — ABNORMAL HIGH (ref 70–99)
Glucose-Capillary: 156 mg/dL — ABNORMAL HIGH (ref 70–99)
Glucose-Capillary: 163 mg/dL — ABNORMAL HIGH (ref 70–99)
Glucose-Capillary: 154 mg/dL — ABNORMAL HIGH (ref 70–99)

## 2022-06-28 NOTE — Progress Notes (Signed)
Physical Therapy Session Note  Patient Details  Name: Jimmy Valentine MRN: [id number] Date of Birth: 1950/12/26  Today's Date: 06/28/2022 PT Individual Time: 0915-1000 PT Individual Time Calculation (min): 45 min   Short Term Goals: Week 1:  PT Short Term Goal 1 (Week 1): Pt will complete bed mobility with modA PT Short Term Goal 2 (Week 1): Pt will complete bed<>chair transfers with maxA of 1 person PT Short Term Goal 3 (Week 1): Pt will maintain unsupported sitting balance for 30 seconds PT Short Term Goal 4 (Week 1): Pt will participate in functional outcome measure to assess falls risk  Skilled Therapeutic Interventions/Progress Updates: Pt presented in TIS handed off from nursing as pt just returned from bathroom. Pt agreeable to therapy. Pt denies pain during session. Session focused on standing balance and gait training via forced use. Pt transported to 4W/MW hallway and set up along wall rail. Pt able to stand with modA with PTA blocking R knee. Worked on weight shifting encouraging increased weight through RLE then shifting to L to offload and attempting to initiate advancing knee. Pt unable to advance therefore pt returned to sitting and PTA donned leg loop. Pt then stood again and pt ambulated ~33ft with use of wall rail and PTA providing maxA to advance RLE. PTA also facilitated weight shifting as well as tapping R quad in stance phase to promote knee extension (pt unable to activate however minimized locking R knee to avoid excessive stress on knee). Mirror feedback provided during ambulation to allow pt to see weight shifting and encourage midline orientation. Pt was able to repeat this ambulating 30 ft in same manner an additional x 2 times. After third gait trial noted malodor with pt indicating may have had BM. Pt transported back to room and performed Stedy transfer to toilet requiring minA to stand from [place]. Once bowels emptied pt performed stand and maintained standing with modA  to allow PTA to complete peri-care. Pt then transferred to bed and stood with modA due to fatigue. Pt required maxA sit to supine and +2 assist for boosting to [facility]. Pt positioned to comfort and left with bed alarm on, call bell within reach and needs met.      Therapy Documentation Precautions:  Precautions Precautions: Fall Precaution Comments: R hemi; dysarthria Restrictions Weight Bearing Restrictions: No General: PT Amount of Missed Time (min): 15 Minutes PT Missed Treatment Reason: Other (Comment) Vital Signs:  Pain: Pain Assessment Pain Scale: 0-10 Pain Score: 0-No pain Mobility:   Locomotion :    Trunk/Postural Assessment :    Balance:   Exercises:   Other Treatments:      Therapy/Group: Individual Therapy  Julie-Anne Torain 06/28/2022, 4:38 PM

## 2022-06-29 DIAGNOSIS — I63512 Cerebral infarction due to unspecified occlusion or stenosis of left middle cerebral artery: Secondary | ICD-10-CM | POA: Diagnosis not present

## 2022-06-29 DIAGNOSIS — I10 Essential (primary) hypertension: Secondary | ICD-10-CM | POA: Diagnosis not present

## 2022-06-29 DIAGNOSIS — K5901 Slow transit constipation: Secondary | ICD-10-CM | POA: Diagnosis not present

## 2022-06-29 DIAGNOSIS — E119 Type 2 diabetes mellitus without complications: Secondary | ICD-10-CM

## 2022-06-29 LAB — CBC
HCT: 42.4 % (ref 39.0–52.0)
Hemoglobin: 15.4 g/dL (ref 13.0–17.0)
MCH: 32.9 pg (ref 26.0–34.0)
MCHC: 36.3 g/dL — ABNORMAL HIGH (ref 30.0–36.0)
MCV: 90.6 fL (ref 80.0–100.0)
Platelets: 298 10*3/uL (ref 150–400)
RBC: 4.68 MIL/uL (ref 4.22–5.81)
RDW: 12.2 % (ref 11.5–15.5)
WBC: 8.1 10*3/uL (ref 4.0–10.5)
nRBC: 0 % (ref 0.0–0.2)

## 2022-06-29 LAB — BASIC METABOLIC PANEL
Anion gap: 7 (ref 5–15)
BUN: 16 mg/dL (ref 8–23)
CO2: 24 mmol/L (ref 22–32)
Calcium: 8.7 mg/dL — ABNORMAL LOW (ref 8.9–10.3)
Chloride: 101 mmol/L (ref 98–111)
Creatinine, Ser: 0.92 mg/dL (ref 0.61–1.24)
GFR, Estimated: >60 mL/min (ref >60)
Glucose, Bld: 150 mg/dL — ABNORMAL HIGH (ref 70–99)
Potassium: 3.9 mmol/L (ref 3.5–5.1)
Sodium: 132 mmol/L — ABNORMAL LOW (ref 135–145)

## 2022-06-29 LAB — GLUCOSE, CAPILLARY
Glucose-Capillary: 143 mg/dL — ABNORMAL HIGH (ref 70–99)
Glucose-Capillary: 164 mg/dL — ABNORMAL HIGH (ref 70–99)
Glucose-Capillary: 121 mg/dL — ABNORMAL HIGH (ref 70–99)
Glucose-Capillary: 157 mg/dL — ABNORMAL HIGH (ref 70–99)

## 2022-06-29 MED ORDER — SENNOSIDES-DOCUSATE SODIUM 8.6-50 MG PO TABS
2.0000 | ORAL_TABLET | Freq: Every day | ORAL | Status: DC
  Administered 2022-06-30 – 2022-07-01 (×2): 2 via ORAL
  Filled 2022-06-29 (×3): qty 2

## 2022-06-29 NOTE — Progress Notes (Signed)
Speech Language Pathology Daily Session Note  Patient Details  Name: Jimmy Valentine MRN: [id number] Date of Birth: 20-Mar-1950  Today's Date: 06/29/2022 SLP Individual Time: 1333-1430 SLP Individual Time Calculation (min): 57 min  Short Term Goals: Week 1: SLP Short Term Goal 1 (Week 1): Patient will consume current diet without overt s/s of aspiration with supervision level verbal cues for use of swallowing compensatory strategies. SLP Short Term Goal 2 (Week 1): Patient will demonstrate 80% intelligibilty at the sentence level with min A verbal cues for use of speech intelligibility strategies. SLP Short Term Goal 3 (Week 1): Pt will utilize compensatory word-finding strategies to repair communication breakdowns with >80% accuracy given min cues. SLP Short Term Goal 4 (Week 1): Patient will consume thin liquid trials without overt s/s of aspiration with min A level verbal cues for use of swallowing compensatory strategies.  Skilled Therapeutic Interventions:  Pt was seen in PM to address dysphagia management and cognitive re- training. Pt was alert and seated upright in wC upon SLP arrival. SLP facilitated session by engaging pt in oral hygiene via suction. Given set up A, pt provided thorough oral hygiene. He was presented with thin liquid trials via disposable measuring cup measuring 10cc's of water. Pt consumed x3 trials via cup edge of disposable cup without overt s/ sx pen/asp. Pt was presented with straw to trial thin liquids via regular cup with cues for small single sips. Pt with x1 weak cough after swallow. Further trials via straw were discontinued. SLP plan to trial 10cc [place] cup in upcoming session in order to start free water protocol. In additional minutes of session, SLP engaged pt in structured word finding activity. Pt challenged to utilize strategies of circumlocution and substitution in order to convey a message to listener. Explanation to pt that actiivty and strategies are  recommended to incorporate into speech in order to maximize expressive language and decrease communication breakdowns. Pt warranting sup to min A for thoroughness. Pt was left seated upright in WC with call button within reach and chair alarm active. SLP to continue POC.   Pain    Therapy/Group: Individual Therapy  Renaee Munda 06/29/2022, 4:06 PM

## 2022-06-29 NOTE — Progress Notes (Signed)
Physical Therapy Session Note  Patient Details  Name: Jimmy Valentine MRN: [id number] Date of Birth: May 05, 1950  Today's Date: 06/29/2022 PT Co-Treatment Time: 1145-1210 PT Co-Treatment Time Calculation (min): 25 min  Short Term Goals: Week 1:  PT Short Term Goal 1 (Week 1): Pt will complete bed mobility with modA PT Short Term Goal 2 (Week 1): Pt will complete bed<>chair transfers with maxA of 1 person PT Short Term Goal 3 (Week 1): Pt will maintain unsupported sitting balance for 30 seconds PT Short Term Goal 4 (Week 1): Pt will participate in functional outcome measure to assess falls risk  Skilled Therapeutic Interventions/Progress Updates:  Patient supine in bed on entrance to room. Patient alert and agreeable to session with both PT and OT in order to focus on transfers and bathing.   Patient with no pain complaint at start of session.  Therapeutic Activity: Transfers: Pt performed squat pivot transfers toward both sides throughout session with minA to L side with max cues and up to MinA +2 to R side. Provided max cues throughout for sequencing and technique.  Prior to transfer to shower bench, pt practices squat pivot w/c <>bed with focus on technique including forward lean and lateral weight/ hip shift. Demos improved effort and use of RLE to complete with MinA +2. Performed back to w/c to L with MinA +2.   Positioned w/c at shower bench with safety rail in front of pt. Able to use safety rail to perform squat pivot to L  side into shower with MinA +1 and +2 providing guard to R knee. No buckling noted with vc for technique. For transfer out of shower and back into w/c, pt able to perform similarly with MinA overall and guard to R knee. Improved ability to perform overall from previous sessions d/t increased activation of R quad during functional movements.   Sit<>stand transfers during session to assist OT with dressing and pt using bedrail for LUE support. Is able to rise to stand  with MinA and maintain bias to L side for CGA/ MinA for balance.   Patient seated upright in w/c at end of session with brakes locked, belt alarm set, and all needs within reach. Tray table in front of pt to setup for lunch arrival.    Therapy Documentation Precautions:  Precautions Precautions: Fall Precaution Comments: R hemi; dysarthria Restrictions Weight Bearing Restrictions: No General:   Vital Signs:  Pain:  No pain related this session.   Therapy/Group: Co-Treatment  Loel Dubonnet PT, DPT, CSRS 06/29/2022, 6:10 PM

## 2022-06-29 NOTE — Progress Notes (Signed)
Physical Therapy Session Note  Patient Details  Name: Jimmy Valentine MRN: [id number] Date of Birth: 11/27/1950  Today's Date: 06/29/2022 PT Individual Time: 4098-1191 PT Individual Time Calculation (min): 63 min   Short Term Goals: Week 1:  PT Short Term Goal 1 (Week 1): Pt will complete bed mobility with modA PT Short Term Goal 2 (Week 1): Pt will complete bed<>chair transfers with maxA of 1 person PT Short Term Goal 3 (Week 1): Pt will maintain unsupported sitting balance for 30 seconds PT Short Term Goal 4 (Week 1): Pt will participate in functional outcome measure to assess falls risk  Skilled Therapeutic Interventions/Progress Updates:  Patient seated upright in standard w/c on entrance to room. Patient alert and agreeable to PT session.   Patient with no pain complaint at start of session.  Therapeutic Activity/ NMR: Transfers: Pt performed squat pivot transfers to L and to R side with improved focus to effort and ability to activate R quad for assist. Focus on shifting hips to each side in order to improve landing at target seat. Completes during session with overall MinA. Cueing for hand placement, positioning, and effort.  Neuromuscular Re-ed: NMR facilitated during session with focus on seated and standing balance. Pt guided in seated balance activities including: Lateral leans improving to lateral trunk flexion to touch elbows to table and return to midline. Requires only CGAto return to midline. Progressed distance in R lateral lean and elbow touch until pt touches table and is able to return with CGA.  Seated reach for squigz on mirror from near ankle height to overhead height on ipsi- and contralateral sides. Performs with one instance of need for light MinA and tc for returning to upright midline positioning.  Guided in standing activities with LUE assist to back of armchair including: Lateral weight shifting with improved shift over RLE and quad activation to maintain with  CGA.  Minisquats 1x10 and 1x5 with increased hip/ knee flexion overall and focus for slow eccentric control. CGA/ minA for balance throughout with good quad activation. With fatigue, pt demos increased lean to R with need for cueing to weight shift hips and head to L and up to intermittent need for ModA to maintain.   NMR performed for improvements in motor control and coordination, balance, sequencing, judgement, and self confidence/ efficacy in performing all aspects of mobility at highest level of independence.   Patient supine in bed at end of session with brakes locked, bed alarm set, and all needs within reach.   Therapy Documentation Precautions:  Precautions Precautions: Fall Precaution Comments: R hemi; dysarthria Restrictions Weight Bearing Restrictions: No General:   Vital Signs:  Pain:  No pain related during session.   Therapy/Group: Individual Therapy  Loel Dubonnet PT, DPT, CSRS 06/29/2022, 6:12 PM

## 2022-06-29 NOTE — Progress Notes (Signed)
Occupational Therapy Session Note  Patient Details  Name: Jimmy Valentine MRN: [id number] Date of Birth: 08/15/1950  Today's Date: 06/29/2022 OT Individual Time: [phone] and 1115-1145 (cotx for 60 min total session from 1115- 1210) OT Individual Time Calculation (min): 45 min and 30 min   Short Term Goals: Week 1:  OT Short Term Goal 1 (Week 1): Pt will be able to sit EOB with close S and don shirt with mod A or less. OT Short Term Goal 2 (Week 1): Pt will be able to sit to stand from EOB with max A of 1 to  prep for LB dressing. OT Short Term Goal 3 (Week 1): Pt will complete squat pivot to toilet with max A of 1. OT Short Term Goal 4 (Week 1): Pt will be able to perform gentle self ROM on RUE with min A.     Skilled Therapeutic Interventions/Progress Updates:    Visit 1: no c/o pain  Pt received in bed ready for therapy.  Explained to pt the plan for his next session at 1115 was for a shower with a focus on transfers and standing without use of the stedy as PT would be cotreating also to help facilitate his movement.  Pt agreed with the plan. This session, pt was set up with estim on forearm for finger and wrist extensor and deltoids  for 30 minutes at intensity 24 for both channels on small muscle atrophy setting.  For the first few minutes of estim, therapist obtained a standard w/c with lap tray and set up shower to be ready for next session. Then during estim, hand over hand guiding of arm with estim impulse for full arm extension and pt cued to visualize that he was "pushing a door closed".   Bed placed flat,  pt worked on bridging with facilitation of R leg into position. Pt able to elevate B hips.  Pt worked on pushing through legs and adjusted higher in bed for better sitting position.  Arm supported by pillows.   Pt resting in bed with all needs met. Alarm set and call light in reach.         Visit 2:  no c/o pain - co tx with physical therapy During this session with PT,  focus  of session on R arm management (used sling to support arm during transfers),  trunk control with numerous opportunities for unsupported sitting (bed, on roho cushion, on tub bench), squat pivot transfers bed to w/c and to tub bench with focus on forward wt shift and sit to stands.  OT supporting UB and assisting with LB self care while PT worked on RLE stability and movement. Pt completed shower with mod A overall seated.   Pt practiced hemi dressing techniques to doff and don shirt with min A and mod cues.  Pt challenged with trunk control with forward and lateral reaching.  Pt participated extremely well and is demonstrating good progress with trunk control and sit to stands.   Pt resting in w/c with all needs met. Alarm set and call light in reach.    Therapy Documentation Precautions:  Precautions Precautions: Fall Precaution Comments: R hemi; dysarthria Restrictions Weight Bearing Restrictions: No     ADL: ADL Grooming: Setup Upper Body Bathing: Minimal assistance Where Assessed-Upper Body Bathing: Shower Lower Body Bathing: Moderate assistance Where Assessed-Lower Body Bathing: Shower Upper Body Dressing: Minimal assistance, Moderate cueing Where Assessed-Upper Body Dressing: Wheelchair Lower Body Dressing: Maximal assistance Where Assessed-Lower Body Dressing: Wheelchair (  with A from stedy lift) Toileting: Dependent Toilet Transfer: Dependent Toilet Transfer Method: Other (comment) (STEDY +2) Walk-In Shower Transfer: Minimal assistance, Moderate assistance Film/video editor Method: Hotel manager, Grab bars ADL Comments: .   Therapy/Group: Individual Therapy  Burnett Lieber 06/29/2022, 1:04 PM

## 2022-06-29 NOTE — Progress Notes (Signed)
PROGRESS NOTE   Subjective/Complaints:  Pt reports multiple bm's yesterday the last of which was 2100. Happy that he's been able to walk with therapy--25'  ROS: Patient denies fever, rash, sore throat, blurred vision, dizziness, nausea, vomiting, diarrhea, cough, shortness of breath or chest pain, joint or back/neck pain, headache, or mood change.   Objective:   No results found. Recent Labs    06/29/22 0558  WBC 8.1  HGB 15.4  HCT 42.4  PLT 298   Recent Labs    06/29/22 0558  NA 132*  K 3.9  CL 101  CO2 24  GLUCOSE 150*  BUN 16  CREATININE 0.92  CALCIUM 8.7*    Intake/Output Summary (Last 24 hours) at 06/29/2022 0920 Last data filed at 06/29/2022 0700 Gross per 24 hour  Intake 476 ml  Output 300 ml  Net 176 ml        Physical Exam: Vital Signs Blood pressure (!) 151/82, pulse 66, temperature 98.3 F (36.8 C), resp. rate 18, height 5\' 9"  (1.753 m), weight 99.8 kg, SpO2 96 %. Constitutional: No distress . Vital signs reviewed. HEENT: NCAT, EOMI, oral membranes moist Neck: supple Cardiovascular: RRR without murmur. No JVD    Respiratory/Chest: CTA Bilaterally without wheezes or rales. Normal effort    GI/Abdomen: BS +, non-tender, non-distended Ext: no clubbing, cyanosis, or edema Psych: pleasant and cooperative  Skin: No evidence of breakdown, no evidence of rash Neurologic: word finding deficits, speech non-fluent. Generally able to convey thoughts. Cranial nerves II through XII intact, motor strength is 5/5 in left and still 0/5 right  deltoid, bicep, tricep, grip. 1/5 hip flexor, knee extensors, 0/5 ankle dorsiflexor and plantar flexor. Right hamstring feels tight Sensory exam normal sensation to light touch and proprioception in bilateral upper and lower extremities Cerebellar exam weakness on right precludes testing  Musculoskeletal: Full range of motion in all 4 extremities. No joint swelling,      Assessment/Plan: 1. Functional deficits which require 3+ hours per day of interdisciplinary therapy in a comprehensive inpatient rehab setting. Physiatrist is providing close team supervision and 24 hour management of active medical problems listed below. Physiatrist and rehab team continue to assess barriers to discharge/monitor patient progress toward functional and medical goals  Care Tool:  Bathing    Body parts bathed by patient: Chest, Abdomen, Front perineal area, Right upper leg, Face, Left upper leg   Body parts bathed by helper: Right arm, Left arm, Front perineal area, Buttocks, Right upper leg, Left upper leg, Left lower leg, Right lower leg     Bathing assist Assist Level: Maximal Assistance - Patient 24 - 49%     Upper Body Dressing/Undressing Upper body dressing   What is the patient wearing?: Pull over shirt    Upper body assist Assist Level: Maximal Assistance - Patient 25 - 49%    Lower Body Dressing/Undressing Lower body dressing      What is the patient wearing?: Pants, Incontinence brief     Lower body assist Assist for lower body dressing: Total Assistance - Patient < 25%     Toileting Toileting    Toileting assist Assist for toileting: 2 Helpers  Transfers Chair/bed transfer  Transfers assist  Chair/bed transfer activity did not occur: Safety/medical concerns  Chair/bed transfer assist level: 2 Helpers     Locomotion Ambulation   Ambulation assist   Ambulation activity did not occur: Safety/medical concerns          Walk 10 feet activity   Assist  Walk 10 feet activity did not occur: Safety/medical concerns        Walk 50 feet activity   Assist Walk 50 feet with 2 turns activity did not occur: Safety/medical concerns         Walk 150 feet activity   Assist Walk 150 feet activity did not occur: Safety/medical concerns         Walk 10 feet on uneven surface  activity   Assist Walk 10 feet on  uneven surfaces activity did not occur: Safety/medical concerns         Wheelchair     Assist Is the patient using a wheelchair?: Yes Type of Wheelchair: Manual    Wheelchair assist level: Dependent - Patient 0%      Wheelchair 50 feet with 2 turns activity    Assist        Assist Level: Dependent - Patient 0%   Wheelchair 150 feet activity     Assist      Assist Level: Dependent - Patient 0%   Blood pressure (!) 151/82, pulse 66, temperature 98.3 F (36.8 C), resp. rate 18, height 5\' 9"  (1.753 m), weight 99.8 kg, SpO2 96 %.  Medical Problem List and Plan: 1. 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 Large basal ganglia infarct. 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              -patient may  shower             -ELOS/Goals: 21-24 days             -Continue WHO and PRAFO             -Continue CIR therapies including PT, OT, SLP   2.  Antithrombotics: -DVT/anticoagulation:  Pharmaceutical: Lovenox 4omg daily              -antiplatelet therapy: Aspirin and Plavix for three months followed by aspirin alone (started 5/20)   3. Pain Management: Tylenol, Robaxin as needed   4. Mood/Behavior/Sleep: LCSW to evaluate and provide emotional support             -antipsychotic agents: n/a             -trazodone as needed   5. Neuropsych/cognition: This patient is capable of making decisions on his own behalf.   6. Skin/Wound Care: Routine skin care checks   7. Fluids/Electrolytes/Nutrition: Routine Is and Os and follow-up chemistries             -continue dys3/nectar thick; SLP eval   8: Hypertension: monitor TID and prn (home meds: amlodipine 10 mg QD, valsartan 320 mg daily)             -continue irbesartan 37.5 mg daily Vitals:   06/28/22 1956 06/29/22 0425  BP: (!) 151/88 (!) 151/82  Pulse: 70 66  Resp:  18 18  Temp: 97.7 F (36.5 C) 98.3 F (36.8 C)  SpO2: 95% 96%   -5/27 permissible hypertension   9: Hyperlipidemia: continue high intensity statin, change simvastatin to atorvastatin  80mg  daily   10: Hyponatremia, mild Na 132: follow-up BMP, will fluid restrict - recheck on 5/27   11: History of gout: restart home colchicine   12: DM type II: HGB A1C 6.7, CBGs QID and SSI; carb  modified diet when advanced (home meds include metformin 1000 mg BID) CBG (last 3)  Recent Labs    06/28/22 1651 06/28/22 2135 06/29/22 0605  GLUCAP 163* 154* 143*    5/27 reasonable control   13: RLE/RUE weakness:normal LE  venous duplex   14: Tobacco use: occasional cigars, advise cessation   15. Constipation             -Miralax daily,add Senna S , sorbitol today    -5/27 pt with multiple bm's over weekend--refused meds yesterday and this morning.    -can hold miralax for now and resume senna-s this evening  LOS: 5 days A FACE TO FACE EVALUATION WAS PERFORMED  Ranelle Oyster 06/29/2022, 9:20 AM

## 2022-06-30 DIAGNOSIS — I63512 Cerebral infarction due to unspecified occlusion or stenosis of left middle cerebral artery: Secondary | ICD-10-CM | POA: Diagnosis not present

## 2022-06-30 LAB — GLUCOSE, CAPILLARY
Glucose-Capillary: 142 mg/dL — ABNORMAL HIGH (ref 70–99)
Glucose-Capillary: 159 mg/dL — ABNORMAL HIGH (ref 70–99)
Glucose-Capillary: 167 mg/dL — ABNORMAL HIGH (ref 70–99)
Glucose-Capillary: 162 mg/dL — ABNORMAL HIGH (ref 70–99)

## 2022-06-30 NOTE — Progress Notes (Signed)
Occupational Therapy Session Note  Patient Details  Name: Jimmy Valentine MRN: [id number] Date of Birth: Sep 27, 1950  Today's Date: 06/30/2022 OT Individual Time: 1055-1210 OT Individual Time Calculation (min): 75 min    Short Term Goals: Week 1:  OT Short Term Goal 1 (Week 1): Pt will be able to sit EOB with close S and don shirt with mod A or less. OT Short Term Goal 2 (Week 1): Pt will be able to sit to stand from EOB with max A of 1 to  prep for LB dressing. OT Short Term Goal 3 (Week 1): Pt will complete squat pivot to toilet with max A of 1. OT Short Term Goal 4 (Week 1): Pt will be able to perform gentle self ROM on RUE with min A.     Skilled Therapeutic Interventions/Progress Updates:    Pt received in w/c ready for therapy.  Focus of therapy session on RUE NMR and standing balance.       Transfers: -squat pivot w/c to mat to his R with mod A, back to wc to L min A  Balance: -sitting EOB with supervision for static sit, min -mod with forward reaching toward floor -static stand with CGA/min A to guard RLE, dynamic stand with no UE support as he moved from sit to stand min -mod A -sit to stand challenge of 8-10x in a row in a quick fluid movement for 2 sets with only CGA  Neuromuscular Re-Education:  -hand over hand guiding of RUE with having arm moving on a ball, towel on table.  Slight activation noted in internal rotators/pecs.   -estim to forearm for finger extension and deltoids with EMPI unit on small muscle atrophy setting. Tolerated intensity at 24 for 28 minutes.  During estim worked on functional activity of pushing a stool bench away with hand over hand guiding. Pt able to feel stimulation but no active movement achieved without estim.    Pt is making excellent progress with standing and transfers.  Pt resting in w/c with all needs met. Alarm set and call light in reach.    Therapy Documentation Precautions:  Precautions Precautions: Fall Precaution Comments: R  hemi; dysarthria Restrictions Weight Bearing Restrictions: No     Pain: Pain Assessment Pain Score: 0-No pain ADL: ADL Grooming: Setup Upper Body Bathing: Minimal assistance Where Assessed-Upper Body Bathing: Shower Lower Body Bathing: Moderate assistance Where Assessed-Lower Body Bathing: Shower Upper Body Dressing: Minimal assistance, Moderate cueing Where Assessed-Upper Body Dressing: Wheelchair Lower Body Dressing: Maximal assistance Where Assessed-Lower Body Dressing: Wheelchair Toileting: Dependent Toilet Transfer: Dependent Statistician Method: Other (comment) (STEDY +2) Walk-In Shower Transfer: Minimal assistance, Moderate assistance Film/video editor Method: Administrator: Emergency planning/management officer, Grab bars ADL Comments: .   Therapy/Group: Individual Therapy  Marry Kusch 06/30/2022, 12:50 PM

## 2022-06-30 NOTE — Progress Notes (Signed)
Speech Language Pathology Daily Session Note  Patient Details  Name: Jimmy Valentine MRN: [id number] Date of Birth: [birth year]  Today's Date: 06/30/2022 SLP Individual Time: 0800-0900 SLP Individual Time Calculation (min): 60 min  Short Term Goals: Week 1: SLP Short Term Goal 1 (Week 1): Patient will consume current diet without overt s/s of aspiration with supervision level verbal cues for use of swallowing compensatory strategies. SLP Short Term Goal 2 (Week 1): Patient will demonstrate 80% intelligibilty at the sentence level with min A verbal cues for use of speech intelligibility strategies. SLP Short Term Goal 3 (Week 1): Pt will utilize compensatory word-finding strategies to repair communication breakdowns with >80% accuracy given min cues. SLP Short Term Goal 4 (Week 1): Patient will consume thin liquid trials without overt s/s of aspiration with min A level verbal cues for use of swallowing compensatory strategies. Week 9:    Skilled Therapeutic Interventions:  Pt was seen in am to address dysphagia management and speech intelligibility. Pt was alert and seen at bedside. SLP assisted pt in repositioning upright with HOB support for breakfast meal. Pt completed oral hygiene via suction. He was presented with breakfast meal consisting of Dys 3 and NTL. Pt administered all trials after set up A. He demonstrated good utilization of rate and amount with indep use of finger sweeps throughout meal to ensure clearance of R buccal cavity. He consumed NTL intermittently throughout session via straw and cup edge with no s/sx pen/ asp. SLP recommending to reduce supervision from full to intermittent supervision. NT and RN were notified. Post breakfast meal, SLP introduced 10cc Provale cup. Pt consumed thin liquid via cup with no immediate s/sx pen/asp. Pt noted with delayed cough after consecutive sips from Provale. Given instruction for single sip at a time, pausing and accepting another sip pt with  no other s/sx pen/asp. SLP recommending thin liquids via 10cc Provale cup. Again, NT and RN were made aware of recommendations. In additional minutes of session, SLP instructed pt in use of speech intelligibility strategies with unfamiliar listeners. Pt communicated with NSG staff with sup A for separating words and over articulation with ~80% speech intelligibility. Pt was left at bedside with call button within reach and chair alarm active. SLP to continue POC.   Pain Pain Assessment Pain Scale: 0-10 Pain Score: 0-No pain  Therapy/Group: Individual Therapy  Renaee Munda 06/30/2022, 8:49 AM

## 2022-06-30 NOTE — Progress Notes (Signed)
Physical Therapy Session Note  Patient Details  Name: Jimmy Valentine MRN: [id number] Date of Birth: 1950/04/03  Today's Date: 06/30/2022 PT Individual Time: 4098-1191 PT Individual Time Calculation (min): 32 min   Short Term Goals: Week 1:  PT Short Term Goal 1 (Week 1): Pt will complete bed mobility with modA PT Short Term Goal 2 (Week 1): Pt will complete bed<>chair transfers with maxA of 1 person PT Short Term Goal 3 (Week 1): Pt will maintain unsupported sitting balance for 30 seconds PT Short Term Goal 4 (Week 1): Pt will participate in functional outcome measure to assess falls risk  Skilled Therapeutic Interventions/Progress Updates: Patient supine in bed on entrance to room. Patient alert and agreeable to PT session.   Patient reported no pain at beginning of PT session. Co-treatment performed today with PT.  Gait Training:  Pt ambulated 67' using 3 musketeer method in main gym. PTA on R side to assist in advancing R LE during gait cycle, and to facilitate R quad/hamstring activation during their corresponding sequence with tactile stimulation. Patient required minA to perform sit to stand from hi/low mat with cues to adjust L LE to ready position, and then to lean anteriorly. Patient provided with maxA +2 with cues for patient to laterally weight shift in order to advance contralateral LE though swing phase, and to increase foot clearance. Patient noted to have R quad activation during stance phase with maxA provided at first for R knee block, but progressed to min-modA per patient presentation of ability to catch R knee from buckling with VC/tactile cues to extend R knee in midstance (PTA hand and leg in close proximity to assist if patient couldn't catch buckle without help, and then hand on to block R knee for patient to bring L LE through swing phase).   Patient left in recliner with family present at end of session with brakes locked, belt alarm set, and all needs within  reach.      Therapy Documentation Precautions:  Precautions Precautions: Fall Precaution Comments: R hemi; dysarthria Restrictions Weight Bearing Restrictions: No  Therapy/Group: Individual Therapy  Herb Beltre PTA 06/30/2022, 4:14 PM

## 2022-06-30 NOTE — Progress Notes (Signed)
PROGRESS NOTE   Subjective/Complaints:  Speech remains dysarthric, answers questions appropriately , no word finding issues   ROS: Patient denies CP, SOB, N/V/D  Objective:   No results found. Recent Labs    06/29/22 0558  WBC 8.1  HGB 15.4  HCT 42.4  PLT 298    Recent Labs    06/29/22 0558  NA 132*  K 3.9  CL 101  CO2 24  GLUCOSE 150*  BUN 16  CREATININE 0.92  CALCIUM 8.7*     Intake/Output Summary (Last 24 hours) at 06/30/2022 0746 Last data filed at 06/30/2022 0704 Gross per 24 hour  Intake 177 ml  Output 551 ml  Net -374 ml         Physical Exam: Vital Signs Blood pressure 137/89, pulse 64, temperature 97.7 F (36.5 C), temperature source Oral, resp. rate 18, height 5\' 9"  (1.753 m), weight 97.2 kg, SpO2 95 %.  General: No acute distress Mood and affect are appropriate Heart: Regular rate and rhythm no rubs murmurs or extra sounds Lungs: Clear to auscultation, breathing unlabored, no rales or wheezes Abdomen: Positive bowel sounds, soft nontender to palpation, nondistended Extremities: No clubbing, cyanosis, or edema   Skin: No evidence of breakdown, no evidence of rash Neurologic: word finding deficits, speech non-fluent. Generally able to convey thoughts. Cranial nerves II through XII intact, motor strength is 5/5 in left and still 0/5 right  deltoid, bicep, tricep, grip. 1/5 hip flexor, knee extensors, 0/5 ankle dorsiflexor and plantar flexor. Right hamstring feels tight Sensory exam normal sensation to light touch  in bilateral upper and lower extremities Cerebellar exam weakness on right precludes testing  Musculoskeletal: Full range of motion in all 4 extremities. No joint swelling,     Assessment/Plan: 1. Functional deficits which require 3+ hours per day of interdisciplinary therapy in a comprehensive inpatient rehab setting. Physiatrist is providing close team supervision and 24  hour management of active medical problems listed below. Physiatrist and rehab team continue to assess barriers to discharge/monitor patient progress toward functional and medical goals  Care Tool:  Bathing    Body parts bathed by patient: Chest, Abdomen, Front perineal area, Right upper leg, Face, Left upper leg   Body parts bathed by helper: Right arm, Left arm, Front perineal area, Buttocks, Right upper leg, Left upper leg, Left lower leg, Right lower leg     Bathing assist Assist Level: Maximal Assistance - Patient 24 - 49%     Upper Body Dressing/Undressing Upper body dressing   What is the patient wearing?: Pull over shirt    Upper body assist Assist Level: Maximal Assistance - Patient 25 - 49%    Lower Body Dressing/Undressing Lower body dressing      What is the patient wearing?: Pants, Incontinence brief     Lower body assist Assist for lower body dressing: Total Assistance - Patient < 25%     Toileting Toileting    Toileting assist Assist for toileting: 2 Helpers     Transfers Chair/bed transfer  Transfers assist  Chair/bed transfer activity did not occur: Safety/medical concerns  Chair/bed transfer assist level: 2 Administrator, Civil Service  Ambulation assist   Ambulation activity did not occur: Safety/medical concerns          Walk 10 feet activity   Assist  Walk 10 feet activity did not occur: Safety/medical concerns        Walk 50 feet activity   Assist Walk 50 feet with 2 turns activity did not occur: Safety/medical concerns         Walk 150 feet activity   Assist Walk 150 feet activity did not occur: Safety/medical concerns         Walk 10 feet on uneven surface  activity   Assist Walk 10 feet on uneven surfaces activity did not occur: Safety/medical concerns         Wheelchair     Assist Is the patient using a wheelchair?: Yes Type of Wheelchair: Manual    Wheelchair assist level:  Dependent - Patient 0%      Wheelchair 50 feet with 2 turns activity    Assist        Assist Level: Dependent - Patient 0%   Wheelchair 150 feet activity     Assist      Assist Level: Dependent - Patient 0%   Blood pressure 137/89, pulse 64, temperature 97.7 F (36.5 C), temperature source Oral, resp. rate 18, height 5\' 9"  (1.753 m), weight 97.2 kg, SpO2 95 %.  Medical Problem List and Plan: 1. 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 Large basal ganglia infarct. 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              -patient may  shower             -ELOS/Goals: 21-24 days             -Continue WHO and PRAFO             -Continue CIR therapies including PT, OT, SLP   2.  Antithrombotics: -DVT/anticoagulation:  Pharmaceutical: Lovenox 4omg daily              -antiplatelet therapy: Aspirin and Plavix for three months followed by aspirin alone (started 5/20)   3. Pain Management: Tylenol, Robaxin as needed   4. Mood/Behavior/Sleep: LCSW to evaluate and provide emotional support             -antipsychotic agents: n/a             -trazodone as needed   5. Neuropsych/cognition: This patient is capable of making decisions on his own behalf.   6. Skin/Wound Care: Routine skin care checks   7. Fluids/Electrolytes/Nutrition: Routine Is and Os and follow-up chemistries             -continue dys3/nectar thick; SLP eval   8: Hypertension: monitor TID and prn (home meds: amlodipine 10 mg QD, valsartan 320 mg daily)             -continue irbesartan 37.5 mg daily Vitals:   06/29/22 1950 06/30/22 0640  BP: (!) 155/50 137/89  Pulse: 65 64  Resp: 18 18  Temp: 97.9 F (36.6 C) 97.7 F (36.5 C)  SpO2: 99% 95%   -5/28 permissible hypertension   9: Hyperlipidemia: continue high intensity statin, change simvastatin to  atorvastatin 80mg  daily   10: Hyponatremia, mild Na 132: follow-up BMP, will fluid restrict - recheck on 5/27- improving cont fluid restriction , renal status stable  Latest Ref Rng & Units 06/29/2022    5:58 AM 06/25/2022    5:54 AM 06/21/2022    5:45 AM  BMP  Glucose 70 - 99 mg/dL 098  119  147   BUN 8 - 23 mg/dL 16  17  20    Creatinine 0.61 - 1.24 mg/dL 8.29  5.62  1.30   Sodium 135 - 145 mmol/L 132  128  132   Potassium 3.5 - 5.1 mmol/L 3.9  4.2  3.7   Chloride 98 - 111 mmol/L 101  95  96   CO2 22 - 32 mmol/L 24  21    Calcium 8.9 - 10.3 mg/dL 8.7  8.8      11: History of gout: restart home colchicine   12: DM type II: HGB A1C 6.7, CBGs QID and SSI; carb  modified diet when advanced (home meds include metformin 1000 mg BID) CBG (last 3)  Recent Labs    06/29/22 1645 06/29/22 2125 06/30/22 0638  GLUCAP 121* 157* 142*     5/28   13: RLE/RUE weakness:normal LE  venous duplex   14: Tobacco use: occasional cigars, advise cessation   15. Constipation             -Miralax daily,add Senna S , sorbitol today    -5/27 pt with multiple bm's over weekend--refused meds yesterday and this morning.    -can hold miralax for now and resume senna-s this evening  LOS: 6 days A FACE TO FACE EVALUATION WAS PERFORMED  Erick Colace 06/30/2022, 7:46 AM

## 2022-06-30 NOTE — Progress Notes (Signed)
Physical Therapy Session Note  Patient Details  Name: RITCHIE PROIETTI MRN: [id number] Date of Birth: 04/08/1950  Today's Date: 06/30/2022 PT Individual Time: 4098-1191 PT Individual Time Calculation (min): 30 min   Short Term Goals: Week 1:  PT Short Term Goal 1 (Week 1): Pt will complete bed mobility with modA PT Short Term Goal 2 (Week 1): Pt will complete bed<>chair transfers with maxA of 1 person PT Short Term Goal 3 (Week 1): Pt will maintain unsupported sitting balance for 30 seconds PT Short Term Goal 4 (Week 1): Pt will participate in functional outcome measure to assess falls risk  Skilled Therapeutic Interventions/Progress Updates:      Pt supine in bed with HOB elevated. Pt agreeable to therapy. Pt denies any pain.   Pt flattened bed. Pt utilized bridging and rolling technique to donn pants with heavy mod A, verbal cues provided for technique, pt assisted with R LE positioning to facilitate bridge technique. Pt performed supine to sit with HOB elevated and max A.   Pt donned R UE sling with max A for transfer. Pt donned shoes with max A while pt maintained seated balance with B UE support . Pt performed squat pivot transfer to L to standard wheelchair with min A, verbal cues provided for lateral scooting to L versus R for safety. Pt requesting to wash face. Pt washed face while seated in WC with set up assist. Pt seated in WC with R UE arm rest on, B leg rests on, seatbelt alarm on and needs within reach. Nurse notified of pt position.   Therapy Documentation Precautions:  Precautions Precautions: Fall Precaution Comments: R hemi; dysarthria Restrictions Weight Bearing Restrictions: No Therapy/Group: Individual Therapy  [facility] Ambrose [place], [place], DPT  06/30/2022, 7:34 AM

## 2022-06-30 NOTE — Progress Notes (Signed)
Physical Therapy Session Note  Patient Details  Name: Jimmy Valentine MRN: [id number] Date of Birth: [birth year]  Today's Date: 06/30/2022 PT Co-Treatment Time: 1332-1401 PT Co-Treatment Time Calculation (min): 29 min  Short Term Goals: Week 1:  PT Short Term Goal 1 (Week 1): Pt will complete bed mobility with modA PT Short Term Goal 2 (Week 1): Pt will complete bed<>chair transfers with maxA of 1 person PT Short Term Goal 3 (Week 1): Pt will maintain unsupported sitting balance for 30 seconds PT Short Term Goal 4 (Week 1): Pt will participate in functional outcome measure to assess falls risk  Skilled Therapeutic Interventions/Progress Updates:  Patient supine in bed on entrance to room. Patient alert and agreeable to PT session. [place] session with Dom PTA for focus on ambulation and NMR for improved overall functional mobility.   Patient with minimal complaint of back pain at start of session.  Therapeutic Activity: Bed Mobility: Pt instructed in bringing BLE off EOB  performed supine <> sit with ***. VC/ tc required for ***. Transfers: Pt performed sit<>stand and stand pivot transfers throughout session with ***. Provided verbal cues for***.  Neuromuscular Re-ed: NMR facilitated during session with focus on***. Pt guided in ***. NMR performed for improvements in motor control and coordination, balance, sequencing, judgement, and self confidence/ efficacy in performing all aspects of mobility at highest level of independence.   Patient *** at end of session with brakes locked, *** alarm set, and all needs within reach.   Therapy Documentation Precautions:  Precautions Precautions: Fall Precaution Comments: R hemi; dysarthria Restrictions Weight Bearing Restrictions: No General:   Vital Signs: Therapy Vitals Temp: 97.7 F (36.5 C) Temp Source: Oral Pulse Rate: 64 Resp: 18 BP: 137/89 Patient Position (if appropriate): Lying Oxygen Therapy SpO2: 95 % O2 Device: Room  Air Pain: Pain Assessment Pain Scale: 0-10 Pain Score: 0-No pain  Therapy/Group: Co-Treatment  Loel Dubonnet PT, DPT, CSRS 06/30/2022, 8:55 AM

## 2022-07-01 DIAGNOSIS — I63512 Cerebral infarction due to unspecified occlusion or stenosis of left middle cerebral artery: Secondary | ICD-10-CM | POA: Diagnosis not present

## 2022-07-01 LAB — GLUCOSE, CAPILLARY
Glucose-Capillary: 136 mg/dL — ABNORMAL HIGH (ref 70–99)
Glucose-Capillary: 153 mg/dL — ABNORMAL HIGH (ref 70–99)
Glucose-Capillary: 142 mg/dL — ABNORMAL HIGH (ref 70–99)
Glucose-Capillary: 149 mg/dL — ABNORMAL HIGH (ref 70–99)

## 2022-07-01 NOTE — Discharge Summary (Signed)
Physician Discharge Summary  Patient ID: [name] MRN: [id number] DOB/AGE: 1950-04-19 72 y.o.  Admit date: 06/24/2022 Discharge date: 07/30/2022  Discharge Diagnoses:  Principal Problem:   Acute ischemic left MCA stroke [facility]) Active problems: Functional deficits secondary to left MCA stroke Hyperlipidemia Hypertension Hyponatremia History of gout DM-2 Tobacco use Constipation   Discharged Condition: stable  Significant Diagnostic Studies: VAS [place] LOWER EXTREMITY VENOUS (DVT)  Result Date: 06/25/2022  Lower Venous DVT Study Patient Name:  Jimmy Valentine  Date of Exam:   06/25/2022 Medical Rec #: [id number]        Accession #:    [id number] Date of Birth: April 04, 1950        Patient Gender: M Patient Age:   69 years Exam Location:  [facility] Procedure:      VAS [place] LOWER EXTREMITY VENOUS (DVT) Referring Phys: Wendi Maya --------------------------------------------------------------------------------  Indications: Stroke, and Rehabilitation.  Risk Factors: Immobility. Limitations: Snoring/apnea. Comparison Study: No prior study on file Performing Technologist: Sherren Kerns RVS  Examination Guidelines: A complete evaluation includes B-mode imaging, spectral Doppler, color Doppler, and power Doppler as needed of all accessible portions of each vessel. Bilateral testing is considered an integral part of a complete examination. Limited examinations for reoccurring indications may be performed as noted. The reflux portion of the exam is performed with the patient in reverse Trendelenburg.  +---------+---------------+---------+-----------+----------+--------------+ RIGHT    CompressibilityPhasicitySpontaneityPropertiesThrombus Aging +---------+---------------+---------+-----------+----------+--------------+ CFV      Full           Yes      Yes                                 +---------+---------------+---------+-----------+----------+--------------+ SFJ      Full                                                         +---------+---------------+---------+-----------+----------+--------------+ FV Prox  Full                                                        +---------+---------------+---------+-----------+----------+--------------+ FV Mid   Full           Yes      Yes                                 +---------+---------------+---------+-----------+----------+--------------+ FV DistalFull                                                        +---------+---------------+---------+-----------+----------+--------------+ PFV      Full                                                        +---------+---------------+---------+-----------+----------+--------------+  POP      Full           Yes      Yes                                 +---------+---------------+---------+-----------+----------+--------------+ PTV      Full                                                        +---------+---------------+---------+-----------+----------+--------------+ PERO     Full                                                        +---------+---------------+---------+-----------+----------+--------------+   +---------+---------------+---------+-----------+----------+--------------+ LEFT     CompressibilityPhasicitySpontaneityPropertiesThrombus Aging +---------+---------------+---------+-----------+----------+--------------+ CFV      Full           Yes      Yes                                 +---------+---------------+---------+-----------+----------+--------------+ SFJ      Full                                                        +---------+---------------+---------+-----------+----------+--------------+ FV Prox  Full                                                        +---------+---------------+---------+-----------+----------+--------------+ FV Mid   Full           Yes      Yes                                  +---------+---------------+---------+-----------+----------+--------------+ FV DistalFull                                                        +---------+---------------+---------+-----------+----------+--------------+ PFV      Full                                                        +---------+---------------+---------+-----------+----------+--------------+ POP      Full           Yes      Yes                                 +---------+---------------+---------+-----------+----------+--------------+  PTV      Full                                                        +---------+---------------+---------+-----------+----------+--------------+ PERO     Full                                                        +---------+---------------+---------+-----------+----------+--------------+     Summary: BILATERAL: - No evidence of deep vein thrombosis seen in the lower extremities, bilaterally. -No evidence of popliteal cyst, bilaterally.   *See table(s) above for measurements and observations. Electronically signed by Heath Lark on 06/25/2022 at 5:43:18 PM.    Final     Labs:  Basic Metabolic Panel: Recent Labs  Lab 06/25/22 0554 06/29/22 0558  NA 128* 132*  K 4.2 3.9  CL 95* 101  CO2 21* 24  GLUCOSE 151* 150*  BUN 17 16  CREATININE 1.02 0.92  CALCIUM 8.8* 8.7*    CBC: Recent Labs  Lab 06/25/22 0554 06/29/22 0558  WBC 8.7 8.1  NEUTROABS 5.1  --   HGB 16.4 15.4  HCT 44.8 42.4  MCV 88.7 90.6  PLT 215 298    CBG: Recent Labs  Lab 06/30/22 0638 06/30/22 1210 06/30/22 1625 06/30/22 2053 07/01/22 0610  GLUCAP 142* 159* 167* 162* 136*    Brief HPI:   Jimmy Valentine is a 72 y.o. male who presented to [facility] ED on 06/21/2022 with sudden onset of right-sided weakness, dysarthria and facial drooping. Code stroke initiated. CT head showed questionable hyperdense left MCA m1 and a chronic lacunar infarct of the left corona radiata/basal ganglia.  He was administered tenecteplase. 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 Carotid duplex showed bilateral stenosis 1-39%. MRI showed large MCA territory basal ganglia infarct with petechial hemorrhage with no hemorrhagic transformation or mass effect. Echo showed EF 65-70%, trivial MVR. Hgb A1c is 6.7%. LDL 91. Started for DAPT x 3 months for intracranial atherosclerosis, then aspirin monotherapy, change statin to atorvastatin and continue at discharge.    On 5/19, patient's bedside RN noted worsening right-sided weakness from her initial assessment (initially slight pronator drift, now minimal antigravity strength noted) and neurology was notified while on the unit at 08:30 AM. Assessed by Dr. Viviann Spare.   Patient's right-sided weakness noted to be fluctuating but patient was still weaker on the right than bedside RN's initial assessment.  Stat CT head order placed and did not show new acute changes.  Patient has right facial droop, withdrawals to pain on right leg. No right upper extremity movement. He is tolerating a D3 diet/nectar liquids.  Reports he feels constipated.    Hospital Course: GUAGE RIOPEL was admitted to rehab 06/24/2022 for inpatient therapies to consist of PT, ST and OT at least three hours five days a week. Past admission physiatrist, therapy team and rehab RN have worked together to provide customized collaborative inpatient rehab. Follow-up labs 5/23 with serum sodium down to 128 and FR of 1200 mL initiated. LE venous duplex negative for DVT.  Serum sodium improved to 132 on 5/27. FR continued. Fitted with  WHO and PRAFO. Serum sodium stable at 131 on 6/03 and improved to 133 on 6/10. Advanced to regular texture diet. He developed erythema over 2nd mMP joint of right hand on 6/10. Colchicine started for 3 days. Neuropsychology consultation on 6/12. AFO ordered. Magnesium 250 added q HS for constipation on 6/22. Reported right  wrist pain on 6/24. X-rays obtained. Improved with Voltaren gel. Sodium stable at 136 on 6/24. CBC normal.  Blood pressures were monitored on TID basis and irbesartan 37.5 mg continued. Elevated systolic BP on 5/30 and irbesartan increased to 75 mg.  Diabetes has been monitored with ac/hs CBG checks and SSI was use prn for tighter BS control. Resumed metformin 500 mg daily on 6/02. Change SSI to sensitive scale on 6/07. Increased metformin to BID dosing on 6/23.    Rehab course: During patient's stay in rehab weekly team conferences were held to monitor patient's progress, set goals and discuss barriers to discharge. At admission, patient required total with mobility and with basic self-care skills. SLP eval 5/24 continue on D3 diet with nectar thick liquids.  He has had improvement in activity tolerance, balance, postural control as well as ability to compensate for deficits. He has had some improvement in functional use RUE/LUE  and RLE/LLE as well as improvement in awareness. Walked ~25 feet with PT on 5/27. RUE weakness persists.       Disposition:  There are no questions and answers to display.         Diet: carb modified  Special Instructions: No driving, alcohol consumption or tobacco use.  Aspirin and Plavix for three months followed by aspirin alone (started 5/20)  Allergies as of 07/01/2022   No Known Allergies   Med Rec must be completed prior to using this [facility]***        Signed: Milinda Antis 07/01/2022, 8:36 AM

## 2022-07-01 NOTE — Discharge Instructions (Addendum)
Inpatient Rehab Discharge Instructions  Jimmy Valentine Discharge date and time: 07/30/2022  Activities/Precautions/ Functional Status: Activity: no lifting, driving, or strenuous exercise until cleared by MD Diet:  dysphagia 3 Wound Care: none needed Functional status:  ___ No restrictions     ___ Walk up steps independently __x_ 24/7 supervision/assistance   ___ Walk up steps with assistance ___ Intermittent supervision/assistance  ___ Bathe/dress independently ___ Walk with walker     ___ Bathe/dress with assistance ___ Walk Independently    ___ Shower independently ___ Walk with assistance    __x_ Shower with assistance __x_ No alcohol     ___ Return to work/school ________ Special Instructions: No driving, alcohol consumption or tobacco use.               STROKE/TIA DISCHARGE INSTRUCTIONS SMOKING Cigarette smoking nearly doubles your risk of having a stroke & is the single most alterable risk factor  If you smoke or have smoked in the last 12 months, you are advised to quit smoking for your health. Most of the excess cardiovascular risk related to smoking disappears within a year of stopping. Ask you doctor about anti-smoking medications [name] Quit Line: 1-800-QUIT NOW Free Smoking Cessation Classes [phone]  CHOLESTEROL Know your levels; limit fat & cholesterol in your diet  Lipid Panel     Component Value Date/Time   CHOL 173 06/22/2022 0328   TRIG 114 06/22/2022 0328   HDL 50 06/22/2022 0328   CHOLHDL 3.5 06/22/2022 0328   VLDL 23 06/22/2022 0328   LDLCALC 100 (H) 06/22/2022 0328     Many patients benefit from treatment even if their cholesterol is at goal. Goal: Total Cholesterol (CHOL) less than 160 Goal:  Triglycerides (TRIG) less than 150 Goal:  HDL greater than 40 Goal:  LDL (LDLCALC) less than 100   BLOOD PRESSURE American Stroke Association blood pressure target is less that 120/80 mm/Hg  Your discharge blood pressure is:  BP: (!) 142/73  Monitor your blood pressure Limit your salt and alcohol intake Many individuals will require more than one medication for high blood pressure  DIABETES (A1c is a blood sugar average for last 3 months) Goal HGBA1c is under 7% (HBGA1c is blood sugar average for last 3 months)  Diabetes: Diagnosis of diabetes:  Your A1c:6.7 %    Lab Results  Component Value Date   HGBA1C 6.7 (H) 03/03/2022    Your HGBA1c can be lowered with medications, healthy diet, and exercise. Check your blood sugar as directed by your physician Call your physician if you experience unexplained or low blood sugars.  PHYSICAL ACTIVITY/REHABILITATION Goal is 30 minutes at least 4 days per week  Activity: Increase activity slowly, Therapies: Physical Therapy: Home Health Return to work: when cleared by MD Activity decreases your risk of heart attack and stroke and makes your heart stronger.  It helps control your weight and blood pressure; helps you relax and can improve your mood. Participate in a regular exercise program. Talk with your doctor about the best form of exercise for you (dancing, walking, swimming, cycling).  DIET/WEIGHT Goal is to maintain a healthy weight  Your discharge diet is:  Diet Order             Diet regular Room service appropriate? Yes; Fluid consistency: Thin; Fluid restriction: 1500 mL Fluid  Diet effective now                 Thin liquids Your height is:  Height: 5\' 9"  (  175.3 cm) Your current weight is: Weight: 97.8 kg Your Body Mass Index (BMI) is:  BMI (Calculated): 31.83 Following the type of diet specifically designed for you will help prevent another stroke. Your goal weight range is:   Your goal Body Mass Index (BMI) is 19-24. Healthy food habits can help reduce 3 risk factors for stroke:  High cholesterol, hypertension, and excess weight.  RESOURCES Stroke/Support Group:  Call [phone]   STROKE EDUCATION PROVIDED/REVIEWED AND GIVEN TO PATIENT Stroke warning signs and  symptoms How to activate emergency medical system (call 911). Medications prescribed at discharge. Need for follow-up after discharge. Personal risk factors for stroke. Pneumonia vaccine given: No Flu vaccine given: No My questions have been answered, the writing is legible, and I understand these instructions.  I will adhere to these goals & educational materials that have been provided to me after my discharge from the hospital.     My questions have been answered and I understand these instructions. I will adhere to these goals and the provided educational materials after my discharge from the hospital.  Patient/Caregiver Signature _______________________________ Date __________  Clinician Signature _______________________________________ Date __________  Please bring this form and your medication list with you to all your follow-up doctor's appointments.

## 2022-07-01 NOTE — Progress Notes (Signed)
Occupational Therapy Session Note  Patient Details  Name: Jimmy Valentine MRN: [id number] Date of Birth: [birth year]  Today's Date: 07/01/2022 OT Individual Time: 7829-5621 OT Individual Time Calculation (min): 70 min    Short Term Goals: Week 1:  OT Short Term Goal 1 (Week 1): Pt will be able to sit EOB with close S and don shirt with mod A or less. OT Short Term Goal 2 (Week 1): Pt will be able to sit to stand from EOB with max A of 1 to  prep for LB dressing. OT Short Term Goal 3 (Week 1): Pt will complete squat pivot to toilet with max A of 1. OT Short Term Goal 4 (Week 1): Pt will be able to perform gentle self ROM on RUE with min A.  Skilled Therapeutic Interventions/Progress Updates:    Pt resting in w/c upon arrival, requesting to take shower. OT intervention with focus on sit<>stand in [place], bathing at shower level, dressing with sit<>stand from w/c, attention to RUE, standing balance, safety awareness, and activity tolerance to increase independence with BADLs. Sit<>stand in [place] with mod A for transfer to TTB in shower. Pt requires max multimodal cues for attention to RUE. Bathing at overall mod A. HOH assistance using RUE to bathe LUE. Pt with LOB to Rt when sitting on TTB but able to self correct. Movements slightly impulsive. Return to room for dressing with sit<>stand from w/c. Pt remained standing in [place] to facilitate donning incontinence brief. Pt maintained standing balance in [place] with CGA. Pt required assistance threading RUE into shirt sleeve and pulling over trunk. Total A for LB dressing tasks and max A for donning shoes. Pt remained seated in w/c with belt alarm activated with half lap tray in place. All needs within reach.   Therapy Documentation Precautions:  Precautions Precautions: Fall Precaution Comments: R hemi; dysarthria Restrictions Weight Bearing Restrictions: No   Pain:  Pt denies pain this morning   Therapy/Group: Individual Therapy  Rich Brave 07/01/2022, 12:06 PM

## 2022-07-01 NOTE — Progress Notes (Signed)
Occupational Therapy Session Note  Patient Details  Name: Jimmy Valentine MRN: [id number] Date of Birth: [birth year]  Today's Date: 07/01/2022 OT Individual Time: 4098-1191 OT Individual Time Calculation (min): 57 min    Short Term Goals: Week 1:  OT Short Term Goal 1 (Week 1): Pt will be able to sit EOB with close S and don shirt with mod A or less. OT Short Term Goal 2 (Week 1): Pt will be able to sit to stand from EOB with max A of 1 to  prep for LB dressing. OT Short Term Goal 3 (Week 1): Pt will complete squat pivot to toilet with max A of 1. OT Short Term Goal 4 (Week 1): Pt will be able to perform gentle self ROM on RUE with min A.  Skilled Therapeutic Interventions/Progress Updates:  Skilled OT intervention completed with focus on squat pivot transfers, w/c mobility, RUE NMR. Pt received upright in bed, agreeable to session. Intermittent/unrated pain reported in L hip verbalizing baseline "sciatic pain"; pt declined intervention. OT offered rest breaks and repositioning throughout for pain reduction.  Pt expressed fatigue however motivated to participate in session.   Transitioned to EOB > R side of bed with mod A for RLE advancement and trunk elevation. Able to maintain static sitting balance with close supervision. Mod A squat pivot > w/c with cues needed for L hand placement and physical placement needed for R hemi side in prep for transfer.  Education provided on hemi strategy for self-propelling in w/c using LUE/LLE. Initially required max A to sequence propulsion however faded with increased repetition to min A with fading cues. Propelled about 50 ft with intermittent min A for avoiding objects on R side.   Seated at table, pt participated in table slides (2x5 each) for AAROM/NMR benefit including R scapular protraction, elbow extension/bicep flexion in gravity eliminated position. With repetition, visual target, and max cues, pt with noted activation (MMT 2/5) in scapular  protraction and bicep flexion however flaccidity (MMT 0/5) in triceps. Noted sublux in R shoulder with 1-2 finger width and would benefit NMR methods to manage this for prevention of pain.  Back in room, pt completed max A squat pivot > R side with immediate LOB > R requiring min A to obtain midline. Mod A bed mobility to upright in bed with assist for RLE and RUE. Noted ability to fully extend R knee when cued!!  Pt remained upright in bed, with bed alarm on/activated, and with all needs in reach at end of session.   Therapy Documentation Precautions:  Precautions Precautions: Fall Precaution Comments: R hemi; dysarthria Restrictions Weight Bearing Restrictions: No    Therapy/Group: Individual Therapy  Melvyn Novas, MS, OTR/L  07/01/2022, 3:47 PM

## 2022-07-01 NOTE — Progress Notes (Signed)
Patient ID: Jimmy Valentine, male   DOB: 1950/04/30, 72 y.o.   MRN: [id number]  Team Conference Report to Patient/Family  Team Conference discussion was reviewed with the patient and caregiver, including goals, any changes in plan of care and target discharge date.  Patient and caregiver express understanding and are in agreement.  The patient has a target discharge date of 07/23/22.  Sw spoke with pt's daughter, Kennyth Arnold and provided team conference updates. Daughter expressed patient is remaining on CIR longer than expected. SW expressed the date is based on pt's current progress, future progress and goals. Daughter agreeable, sw expressed that updates are sent to [employer] company weekly. Daughter requesting HH therapies at discharge. Sw will arrange for daughter to attend family education closer to d/c. No additional questions or concerns.   Andria Rhein 07/01/2022, 2:46 PM

## 2022-07-01 NOTE — Progress Notes (Signed)
PROGRESS NOTE   Subjective/Complaints:  No issues overnite , walked with assist x 2, 58 steps per pt report  Wants a shower   ROS: Patient denies CP, SOB, N/V/D, no bowel problems   Objective:   No results found. Recent Labs    06/29/22 0558  WBC 8.1  HGB 15.4  HCT 42.4  PLT 298    Recent Labs    06/29/22 0558  NA 132*  K 3.9  CL 101  CO2 24  GLUCOSE 150*  BUN 16  CREATININE 0.92  CALCIUM 8.7*     Intake/Output Summary (Last 24 hours) at 07/01/2022 0754 Last data filed at 07/01/2022 0504 Gross per 24 hour  Intake 468 ml  Output 250 ml  Net 218 ml         Physical Exam: Vital Signs Blood pressure 136/79, pulse 66, temperature 98 F (36.7 C), temperature source Oral, resp. rate 17, height 5\' 9"  (1.753 m), weight 97.2 kg, SpO2 100 %.  General: No acute distress Mood and affect are appropriate Heart: Regular rate and rhythm no rubs murmurs or extra sounds Lungs: Clear to auscultation, breathing unlabored, no rales or wheezes Abdomen: Positive bowel sounds, soft nontender to palpation, nondistended Extremities: No clubbing, cyanosis, or edema   Skin: No evidence of breakdown, no evidence of rash Neurologic: word finding deficits, speech non-fluent. Generally able to convey thoughts. Cranial nerves II through XII intact, motor strength is 5/5 in left and still 0/5 right  deltoid, bicep, tricep, grip. 1/5 hip flexor, knee extensors, 0/5 ankle dorsiflexor and 2- plantar flexor. Right hamstring feels tight Sensory exam normal sensation to light touch  in bilateral upper and lower extremities Cerebellar exam weakness on right precludes testing  Musculoskeletal: Full range of motion in all 4 extremities. No joint swelling,     Assessment/Plan: 1. Functional deficits which require 3+ hours per day of interdisciplinary therapy in a comprehensive inpatient rehab setting. Physiatrist is providing close team  supervision and 24 hour management of active medical problems listed below. Physiatrist and rehab team continue to assess barriers to discharge/monitor patient progress toward functional and medical goals  Care Tool:  Bathing    Body parts bathed by patient: Chest, Abdomen, Front perineal area, Right upper leg, Face, Left upper leg   Body parts bathed by helper: Right arm, Left arm, Front perineal area, Buttocks, Right upper leg, Left upper leg, Left lower leg, Right lower leg     Bathing assist Assist Level: Maximal Assistance - Patient 24 - 49%     Upper Body Dressing/Undressing Upper body dressing   What is the patient wearing?: Pull over shirt    Upper body assist Assist Level: Maximal Assistance - Patient 25 - 49%    Lower Body Dressing/Undressing Lower body dressing      What is the patient wearing?: Pants, Incontinence brief     Lower body assist Assist for lower body dressing: Total Assistance - Patient < 25%     Toileting Toileting    Toileting assist Assist for toileting: 2 Helpers     Transfers Chair/bed transfer  Transfers assist  Chair/bed transfer activity did not occur: Safety/medical concerns  Chair/bed transfer  assist level: 2 Helpers     Locomotion Ambulation   Ambulation assist   Ambulation activity did not occur: Safety/medical concerns          Walk 10 feet activity   Assist  Walk 10 feet activity did not occur: Safety/medical concerns        Walk 50 feet activity   Assist Walk 50 feet with 2 turns activity did not occur: Safety/medical concerns         Walk 150 feet activity   Assist Walk 150 feet activity did not occur: Safety/medical concerns         Walk 10 feet on uneven surface  activity   Assist Walk 10 feet on uneven surfaces activity did not occur: Safety/medical concerns         Wheelchair     Assist Is the patient using a wheelchair?: Yes Type of Wheelchair: Manual    Wheelchair  assist level: Dependent - Patient 0%      Wheelchair 50 feet with 2 turns activity    Assist        Assist Level: Dependent - Patient 0%   Wheelchair 150 feet activity     Assist      Assist Level: Dependent - Patient 0%   Blood pressure 136/79, pulse 66, temperature 98 F (36.7 C), temperature source Oral, resp. rate 17, height 5\' 9"  (1.753 m), weight 97.2 kg, SpO2 100 %.  Medical Problem List and Plan: 1. 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 Large basal ganglia infarct. 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              -patient may  shower             -ELOS/Goals: 21-24 days             -Continue WHO and PRAFO             -Continue CIR therapies including PT, OT, SLP  Team conference today please see physician documentation under team conference tab, met with team  to discuss problems,progress, and goals. Formulized individual treatment plan based on medical history, underlying problem and comorbidities.  2.  Antithrombotics: -DVT/anticoagulation:  Pharmaceutical: Lovenox 4omg daily              -antiplatelet therapy: Aspirin and Plavix for three months followed by aspirin alone (started 5/20)   3. Pain Management: Tylenol, Robaxin as needed   4. Mood/Behavior/Sleep: LCSW to evaluate and provide emotional support             -antipsychotic agents: n/a             -trazodone as needed   5. Neuropsych/cognition: This patient is capable of making decisions on his own behalf.   6. Skin/Wound Care: Routine skin care checks   7. Fluids/Electrolytes/Nutrition: Routine Is and Os and follow-up chemistries             -continue dys3/nectar thick; SLP eval   8: Hypertension: monitor TID and prn (home meds: amlodipine 10 mg QD, valsartan 320 mg daily)             -continue irbesartan 37.5 mg daily Vitals:    06/30/22 1305 07/01/22 0504  BP: 127/79 136/79  Pulse: 70 66  Resp: 15 17  Temp: 98.6 F (37 C) 98 F (36.7 C)  SpO2: 98% 100%   -5/29  controlled    9: Hyperlipidemia: continue high intensity statin, change simvastatin to atorvastatin 80mg  daily   10: Hyponatremia, mild Na 132: follow-up BMP, will fluid restrict - recheck on 5/27- improving cont fluid restriction , renal status stable       Latest Ref Rng & Units 06/29/2022    5:58 AM 06/25/2022    5:54 AM 06/21/2022    5:45 AM  BMP  Glucose 70 - 99 mg/dL 161  096  045   BUN 8 - 23 mg/dL 16  17  20    Creatinine 0.61 - 1.24 mg/dL 4.09  8.11  9.14   Sodium 135 - 145 mmol/L 132  128  132   Potassium 3.5 - 5.1 mmol/L 3.9  4.2  3.7   Chloride 98 - 111 mmol/L 101  95  96   CO2 22 - 32 mmol/L 24  21    Calcium 8.9 - 10.3 mg/dL 8.7  8.8      11: History of gout: restart home colchicine   12: DM type II: HGB A1C 6.7, CBGs QID and SSI; carb  modified diet when advanced (home meds include metformin 1000 mg BID) CBG (last 3)  Recent Labs    06/30/22 1625 06/30/22 2053 07/01/22 0610  GLUCAP 167* 162* 136*     5/29 good control    13: RLE/RUE weakness:normal LE  venous duplex   14: Tobacco use: occasional cigars, advise cessation   15. Constipation             -Miralax daily,add Senna S , sorbitol today    -5/27 pt with multiple bm's over weekend--refused meds yesterday and this morning.    -can hold miralax for now and resume senna-s this evening  LOS: 7 days A FACE TO FACE EVALUATION WAS PERFORMED  Erick Colace 07/01/2022, 7:54 AM

## 2022-07-01 NOTE — Progress Notes (Signed)
Physical Therapy Session Note  Patient Details  Name: Jimmy Valentine MRN: [id number] Date of Birth: [birth year]  Today's Date: 07/01/2022 PT Individual Time: 0800-0915 PT Individual Time Calculation (min): 75 min   Short Term Goals: Week 1:  PT Short Term Goal 1 (Week 1): Pt will complete bed mobility with modA PT Short Term Goal 2 (Week 1): Pt will complete bed<>chair transfers with maxA of 1 person PT Short Term Goal 3 (Week 1): Pt will maintain unsupported sitting balance for 30 seconds PT Short Term Goal 4 (Week 1): Pt will participate in functional outcome measure to assess falls risk  Skilled Therapeutic Interventions/Progress Updates: Pt presents semi-reclined in bed and agreeable to therapy.  PT retrieved scrub pants and top and required total A to thread pants over R foot.  Pt able to pull pants over L hip w/ B bridging, max A maintaining R foot positioning.  Realized brief soiled.  Pt rolled w/ Max A B sides for doffing/donning clean brief and performing pericare total A.  Pt rolled to R and required max A for sidelying to sitting EOB.  Pt scoots to EOB w/ min A and verbal cues.   Pt required max A for R arm in shirt and then min A for pulling over head.  Pt required only CGA for balance donning shoes w/ total A.  Pt performed sit to stand w/ min A blocking R knee and then performed squat pivot to L w/ mod A and verbal cues.  Pt wheeled to main gym for time conservation.  Pt performed multiple sit to stand in // bars w/ min A and cueing for wt bearing to R LE.  Pt performed steps forward and back w/ LLE for increased NMR to RLE w/ as little as CGA to block R knee, noted quad activation.  Pt performed reaching far forward on L bar and cueing for sequencing to decrease assist from UE.  Pt performed 3 x 5-8 partial squats w/ L hand on PT shoulder (PT in front), manual cueing for R quads along w/ proper body mechanics.  Pt returned to room stating need for BM.  W/c taken into BR and pt performed  step-pivot transfer to toilet using railing and Min blocking of R knee, pt requires cues to slow down and advance each LE separately to improve WB to RLE.  Pt w/o BM, returned to w/c in same fashion.  Pt remained sitting in w/c w/ chair alarm on, 1/2 tray and all needs in reach.     Therapy Documentation Precautions:  Precautions Precautions: Fall Precaution Comments: R hemi; dysarthria Restrictions Weight Bearing Restrictions: No General:   Vital Signs:   Pain:0/10 Pain Assessment Pain Scale: 0-10 Pain Score: 0-No pain    Therapy/Group: Individual Therapy  [name] 07/01/2022, 9:22 AM

## 2022-07-02 DIAGNOSIS — I63512 Cerebral infarction due to unspecified occlusion or stenosis of left middle cerebral artery: Secondary | ICD-10-CM | POA: Diagnosis not present

## 2022-07-02 LAB — GLUCOSE, CAPILLARY
Glucose-Capillary: 138 mg/dL — ABNORMAL HIGH (ref 70–99)
Glucose-Capillary: 138 mg/dL — ABNORMAL HIGH (ref 70–99)
Glucose-Capillary: 137 mg/dL — ABNORMAL HIGH (ref 70–99)
Glucose-Capillary: 189 mg/dL — ABNORMAL HIGH (ref 70–99)

## 2022-07-02 MED ORDER — IRBESARTAN 75 MG PO TABS
75.0000 mg | ORAL_TABLET | Freq: Every day | ORAL | Status: DC
  Administered 2022-07-03 – 2022-07-30 (×28): 75 mg via ORAL
  Filled 2022-07-02 (×28): qty 1

## 2022-07-02 NOTE — Progress Notes (Signed)
Physical Therapy Session Note  Patient Details  Name: Jimmy Valentine MRN: [id number] Date of Birth: 1950/12/24  Today's Date: 07/02/2022 PT Individual Time: 0959-1052 PT Individual Time Calculation (min): 53 min  PT Individual Missed Time: 7 min (time management)  Short Term Goals: Week 1:  PT Short Term Goal 1 (Week 1): Pt will complete bed mobility with modA PT Short Term Goal 2 (Week 1): Pt will complete bed<>chair transfers with maxA of 1 person PT Short Term Goal 3 (Week 1): Pt will maintain unsupported sitting balance for 30 seconds PT Short Term Goal 4 (Week 1): Pt will participate in functional outcome measure to assess falls risk  Skilled Therapeutic Interventions/Progress Updates: Patient in [facility] on entrance to room. Patient alert and agreeable to PT session.   Patient reported no pain or complaints at beginning of session.   Therapeutic Activity: Transfers: Pt performed sit<>stand and transfer throughout session with minA for safety and R LE protection (from buckling). Provided VC for anterior scoot to edge, and posterior scoot for postural adjustment.   Gait Training:  Pt ambulated 30' x 1, and 25' x 1 with seated rest break provided in between trials. Patient used L hand rail outside of main gym with PTA on R side facilitating R LE advancement while sitting, and anterior hip shift in corresponding gait cycle. Patient required modA to maintain balance with occasional maxA to prevent forward lean with cues provided to shift weight posteriorly to center self in standing. PTA provided maxA to advance R LE, and cues to contract quads with tactile facilitation provided to extend during stance phase (mod-max cues). Patient cued to step reciprocally with visual target of PTA foot to step to desired length for more appropriate gait sequence.  Neuromuscular Re-ed: NMR facilitated during session with focus on proprioceptive feedback on R LE, motor coordination, and weight shifting. -  Lateral weight shifting in front of mirror for static standing postural adjustment, and to regain neuromuscular control of R quadricepts while WB on R LE. Patient was able to statically stand with PTA close supervision for several seconds before starting to lose balance, and min to occasional modA to center WB (cues to notice when patient leaning on PTA that was on the R side facilitating R knee extension when required).  NMR performed for improvements in motor control and coordination, balance, sequencing, judgement, and self confidence/ efficacy in performing all aspects of mobility at highest level of independence.   Patient left in [facility] at end of session with brakes locked, belt alarm set, and all needs within reach.      Therapy Documentation Precautions:  Precautions Precautions: Fall Precaution Comments: R hemi; dysarthria Restrictions Weight Bearing Restrictions: No   Therapy/Group: Individual Therapy  Lemar Bakos PTA 07/02/2022, 4:31 PM

## 2022-07-02 NOTE — Progress Notes (Signed)
PROGRESS NOTE   Subjective/Complaints:  No issues overnite , discussed d/c date   ROS: Patient denies CP, SOB, N/V/D, no bowel problems   Objective:   No results found. No results for input(s): "WBC", "HGB", "HCT", "PLT" in the last 72 hours.  No results for input(s): "NA", "K", "CL", "CO2", "GLUCOSE", "BUN", "CREATININE", "CALCIUM" in the last 72 hours.   Intake/Output Summary (Last 24 hours) at 07/02/2022 0801 Last data filed at 07/02/2022 0738 Gross per 24 hour  Intake 476 ml  Output 650 ml  Net -174 ml         Physical Exam: Vital Signs Blood pressure (!) 157/78, pulse 65, temperature 98.1 F (36.7 C), resp. rate 18, height 5\' 9"  (1.753 m), weight 95.2 kg, SpO2 95 %.  General: No acute distress Mood and affect are appropriate Heart: Regular rate and rhythm no rubs murmurs or extra sounds Lungs: Clear to auscultation, breathing unlabored, no rales or wheezes Abdomen: Positive bowel sounds, soft nontender to palpation, nondistended Extremities: No clubbing, cyanosis, or edema   Skin: No evidence of breakdown, no evidence of rash Neurologic: word finding deficits, speech non-fluent. Generally able to convey thoughts. Cranial nerves II through XII intact, motor strength is 5/5 in left and still 0/5 right  deltoid, bicep, tricep, grip. 1/5 hip flexor, knee extensors, 0/5 ankle dorsiflexor and 2- plantar flexor. Right hamstring feels tight Sensory exam normal sensation to light touch  in bilateral upper and lower extremities Cerebellar exam weakness on right precludes testing  Musculoskeletal: Full range of motion in all 4 extremities. No joint swelling,     Assessment/Plan: 1. Functional deficits which require 3+ hours per day of interdisciplinary therapy in a comprehensive inpatient rehab setting. Physiatrist is providing close team supervision and 24 hour management of active medical problems listed  below. Physiatrist and rehab team continue to assess barriers to discharge/monitor patient progress toward functional and medical goals  Care Tool:  Bathing    Body parts bathed by patient: Abdomen, Chest, Right arm, Front perineal area, Right upper leg, Left upper leg, Face   Body parts bathed by helper: Left arm, Buttocks, Right lower leg, Left lower leg     Bathing assist Assist Level: Moderate Assistance - Patient 50 - 74%     Upper Body Dressing/Undressing Upper body dressing   What is the patient wearing?: Pull over shirt    Upper body assist Assist Level: Moderate Assistance - Patient 50 - 74%    Lower Body Dressing/Undressing Lower body dressing      What is the patient wearing?: Incontinence brief, Pants     Lower body assist Assist for lower body dressing: Total Assistance - Patient < 25%     Toileting Toileting    Toileting assist Assist for toileting: 2 Helpers     Transfers Chair/bed transfer  Transfers assist  Chair/bed transfer activity did not occur: Safety/medical concerns  Chair/bed transfer assist level: Moderate Assistance - Patient 50 - 74%     Locomotion Ambulation   Ambulation assist   Ambulation activity did not occur: Safety/medical concerns          Walk 10 feet activity   Assist  Walk  10 feet activity did not occur: Safety/medical concerns        Walk 50 feet activity   Assist Walk 50 feet with 2 turns activity did not occur: Safety/medical concerns         Walk 150 feet activity   Assist Walk 150 feet activity did not occur: Safety/medical concerns         Walk 10 feet on uneven surface  activity   Assist Walk 10 feet on uneven surfaces activity did not occur: Safety/medical concerns         Wheelchair     Assist Is the patient using a wheelchair?: Yes Type of Wheelchair: Manual    Wheelchair assist level: Minimal Assistance - Patient > 75% Max wheelchair distance: 50 ft     Wheelchair 50 feet with 2 turns activity    Assist        Assist Level: Dependent - Patient 0%   Wheelchair 150 feet activity     Assist      Assist Level: Dependent - Patient 0%   Blood pressure (!) 157/78, pulse 65, temperature 98.1 F (36.7 C), resp. rate 18, height 5\' 9"  (1.753 m), weight 95.2 kg, SpO2 95 %.  Medical Problem List and Plan: 1. 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 Large basal ganglia infarct. 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              -patient may  shower             -ELOS/Goals: 21-24 days             -Continue WHO and PRAFO             -Continue CIR therapies including PT, OT, SLP   2.  Antithrombotics: -DVT/anticoagulation:  Pharmaceutical: Lovenox 4omg daily              -antiplatelet therapy: Aspirin and Plavix for three months followed by aspirin alone (started 5/20)   3. Pain Management: Tylenol, Robaxin as needed   4. Mood/Behavior/Sleep: LCSW to evaluate and provide emotional support             -antipsychotic agents: n/a             -trazodone as needed   5. Neuropsych/cognition: This patient is capable of making decisions on his own behalf.   6. Skin/Wound Care: Routine skin care checks   7. Fluids/Electrolytes/Nutrition: Routine Is and Os and follow-up chemistries             -continue dys3/nectar thick; SLP eval   8: Hypertension: monitor TID and prn (home meds: amlodipine 10 mg QD, valsartan 320 mg daily)             -continue irbesartan 37.5 mg daily Vitals:   07/01/22 1931 07/02/22 0524  BP: (!) 169/74 (!) 157/78  Pulse: 66 65  Resp: 17 18  Temp: 98 F (36.7 C) 98.1 F (36.7 C)  SpO2: 98% 95%   -5/30 elevated systolic will increase irbesartan to 75mg    9: Hyperlipidemia: continue high intensity statin, change simvastatin to atorvastatin 80mg  daily    10: Hyponatremia, mild Na 132: follow-up BMP, will fluid restrict - recheck on 5/27- improving cont fluid restriction , renal status stable       Latest Ref Rng & Units 06/29/2022    5:58 AM 06/25/2022    5:54 AM  06/21/2022    5:45 AM  BMP  Glucose 70 - 99 mg/dL 161  096  045   BUN 8 - 23 mg/dL 16  17  20    Creatinine 0.61 - 1.24 mg/dL 4.09  8.11  9.14   Sodium 135 - 145 mmol/L 132  128  132   Potassium 3.5 - 5.1 mmol/L 3.9  4.2  3.7   Chloride 98 - 111 mmol/L 101  95  96   CO2 22 - 32 mmol/L 24  21    Calcium 8.9 - 10.3 mg/dL 8.7  8.8      11: History of gout: restart home colchicine   12: DM type II: HGB A1C 6.7, CBGs QID and SSI; carb  modified diet when advanced (home meds include metformin 1000 mg BID) CBG (last 3)  Recent Labs    07/01/22 1611 07/01/22 2122 07/02/22 0614  GLUCAP 142* 149* 138*     5/30 good control    13: RLE/RUE weakness:normal LE  venous duplex   14: Tobacco use: occasional cigars, advise cessation   15. Constipation             -Miralax daily,add Senna S , sorbitol today    -5/27 pt with multiple bm's over weekend--refused meds yesterday and this morning.    -can hold miralax for now and resume senna-s this evening  LOS: 8 days A FACE TO FACE EVALUATION WAS PERFORMED  Erick Colace 07/02/2022, 8:01 AM

## 2022-07-02 NOTE — Progress Notes (Signed)
Speech Language Pathology Daily Session Note  Patient Details  Name: DEARIUS MCENTIRE MRN: [id number] Date of Birth: October 15, 1950  Today's Date: 07/02/2022 SLP Individual Time: 0730-0827 SLP Individual Time Calculation (min): 57 min  Short Term Goals: Week 1: SLP Short Term Goal 1 (Week 1): Patient will consume current diet without overt s/s of aspiration with supervision level verbal cues for use of swallowing compensatory strategies. SLP Short Term Goal 2 (Week 1): Patient will demonstrate 80% intelligibilty at the sentence level with min A verbal cues for use of speech intelligibility strategies. SLP Short Term Goal 3 (Week 1): Pt will utilize compensatory word-finding strategies to repair communication breakdowns with >80% accuracy given min cues. SLP Short Term Goal 4 (Week 1): Patient will consume thin liquid trials without overt s/s of aspiration with min A level verbal cues for use of swallowing compensatory strategies.  Skilled Therapeutic Interventions:   Pt seen for skilled SLP session to address speech and swallowing goals.   Speech: Conversational speech observed to be >80% intelligible to unfamiliar listener today. Pt able to slow down speech and over-articulate to correct communication breakdowns whenever he was asked to clarify or repeat himself. He is aware of his errors and aware of clear speech strategies.   Swallowing: SLP prompted therapeutic PO trials of thin liquids via cup and straw. Oral phase significant for occasional, min anterior spillage due to reduced labial seal. Pharyngeal swallow appeared prompt with no overt or subtle s/s of aspiration observed across trials. Pt consumed ~6-7oz of liquids throughout this session. Recommend continue Dys 3 diet with upgrade to thin liquids by open cup or straw. Pt has a provale cup, though does not need to use this unless he exhibits concerns for aspiration with liquids. Plan to trial regular consistencies in upcoming sessions given  pt is hopeful to advance diet as indicated.   Pt left sitting up in w/c with chair alarm set and call bell in reach. Continue SLP PoC.   Pain Pain Assessment Pain Scale: 0-10 Pain Score: 0-No pain  Therapy/Group: Individual Therapy  Ellery Plunk 07/02/2022, 11:41 AM

## 2022-07-02 NOTE — Progress Notes (Signed)
Physical Therapy Session Note  Patient Details  Name: Jimmy Valentine MRN: [id number] Date of Birth: September 10, 1950  Today's Date: 07/02/2022 PT Individual Time: [phone] PT Individual Time Calculation (min): 60 min   Short Term Goals: Week 1:  PT Short Term Goal 1 (Week 1): Pt will complete bed mobility with modA PT Short Term Goal 2 (Week 1): Pt will complete bed<>chair transfers with maxA of 1 person PT Short Term Goal 3 (Week 1): Pt will maintain unsupported sitting balance for 30 seconds PT Short Term Goal 4 (Week 1): Pt will participate in functional outcome measure to assess falls risk  Skilled Therapeutic Interventions/Progress Updates:    Pt received supine in bed, awake with a visitor present and pt agreeable to therapy session. Reports need to use bathroom. Donned shoes total assist. Supine>sitting R EOB, HOB partially elevated and using L UE support on bedrails to assist with trunk upright, requires light max assist for trunk control and R hemibody management.  Sit>stand EOB>stedy with CGA for safety and therapist assisting with R UE management and R LE placement on stedy platform. Stedy transport in/out bathroom for time management. Stands in stedy with CGA/close supervision during dependent LB clothing management. Pt had started to be incontinent of bowels, unable to void further on toilet, dependent peri-care.    Transported to/from gym in w/c for time management and energy conservation.  Sit>stands from w/c or EOM to +2 L HHA with min assist for balance from therapist, especially once fatigued, with pt having delayed R hip/knee extension.  Donned R LE ankle DF assist ACE wrap. Donned leg loop to assist with swing phase advancement during gait training.  Gait training 71ft+ 42ftx3 using +2 L HHA providing min assist for balance and therapist providing heavy min/light mod assist for balance and R LE management. Pt demonstrating the following gait deviations with therapist providing  the described cuing and facilitation for improvement:  - pt able to stabilize R LE during stance phase with only occasional min guarding assist, but does keep knee slightly flexed  - when slowed down and cued for L weight shift onto L stance phase; pt was able to advance R LE ~50% of the way during swing, until becoming fatigued then only ~25% -  - requires mod/max assist for swing advancement - tends to have R anterior lean/LOB due to delayed R swing advancement  Discussed option of gait training in litegait harness to allow pt to feel his weight shifts and advance R LE more independently while staying safe.   At end of session, pt left seated in w/c with R UE supported on arm trough, seat belt alarm on, and family present.    Therapy Documentation Precautions:  Precautions Precautions: Fall Precaution Comments: R hemi; dysarthria Restrictions Weight Bearing Restrictions: No   Pain:  No reports of pain throughout session.   Therapy/Group: Individual Therapy  Ginny Forth , PT, DPT, NCS, CSRS 07/02/2022, 4:04 PM

## 2022-07-02 NOTE — Progress Notes (Signed)
Occupational Therapy Session Note  Patient Details  Name: Jimmy Valentine MRN: [id number] Date of Birth: March 09, 1950  Today's Date: 07/02/2022 OT Individual Time: 1115-1200 OT Individual Time Calculation (min): 45 min    Short Term Goals: Week 1:  OT Short Term Goal 1 (Week 1): Pt will be able to sit EOB with close S and don shirt with mod A or less. OT Short Term Goal 2 (Week 1): Pt will be able to sit to stand from EOB with max A of 1 to  prep for LB dressing. OT Short Term Goal 3 (Week 1): Pt will complete squat pivot to toilet with max A of 1. OT Short Term Goal 4 (Week 1): Pt will be able to perform gentle self ROM on RUE with min A.     Skilled Therapeutic Interventions/Progress Updates:    Pt received in wc ready for therapy. Pt taken to gym and transferred squat pivot to mat to R with mod A.  Focus of session on R UE NMR - used tapping, hand over hand guiding and estim to deltoids and forearm extensors to facilitate movement.  Today he did have some active arm adduction.  Pt then transferred back to wc to L with min A sq pivot. Used arm sling for support today.  Pt returned to room. Pt resting in w/c with all needs met. Alarm set and call light in reach.    Therapy Documentation Precautions:  Precautions Precautions: Fall Precaution Comments: R hemi; dysarthria Restrictions Weight Bearing Restrictions: No    Vital Signs: Therapy Vitals Temp: 98.4 F (36.9 C) Temp Source: Oral Pulse Rate: 70 Resp: 18 BP: 121/79 Patient Position (if appropriate): Lying Oxygen Therapy SpO2: 99 % O2 Device: Room Air Pain: Pain Assessment Pain Scale: 0-10 Pain Score: 0-No pain ADL: ADL Grooming: Setup Upper Body Bathing: Minimal assistance Where Assessed-Upper Body Bathing: Shower Lower Body Bathing: Moderate assistance Where Assessed-Lower Body Bathing: Shower Upper Body Dressing: Minimal assistance, Moderate cueing Where Assessed-Upper Body Dressing: Wheelchair Lower Body  Dressing: Maximal assistance Where Assessed-Lower Body Dressing: Wheelchair Toileting: Dependent Toilet Transfer: Dependent Statistician Method: Other (comment) (STEDY +2) Walk-In Shower Transfer: Minimal assistance, Moderate assistance Film/video editor Method: Administrator: Emergency planning/management officer, Grab bars ADL Comments: .    Therapy/Group: Individual Therapy  Daniele Dillow 07/02/2022, 1:40 PM

## 2022-07-02 NOTE — Patient Care Conference (Signed)
Inpatient RehabilitationTeam Conference and Plan of Care Update Date: 07/01/2022   Time: 10:13 AM    Patient Name: Jimmy Valentine      Medical Record Number: [id number]  Date of Birth: 03-28-1950 Sex: Male         Room/Bed: 4W20C/4W20C-01 Payor Info: Payor: [employer] MEDICARE / Plan: [employer] MEDICARE HMO/PPO / Product Type: *No Product type* /    Admit Date/Time:  06/24/2022  4:57 PM  Primary Diagnosis:  Acute ischemic left MCA stroke [facility])  Hospital Problems: Principal Problem:   Acute ischemic left MCA stroke [facility])    Expected Discharge Date: Expected Discharge Date: 07/23/22  Team Members Present: Physician leading conference: Dr. Claudette Laws Social Worker Present: Lavera Guise, BSW Nurse Present: Chana Bode, RN PT Present: Amedeo Plenty, PT OT Present: Bonnell Public, OT SLP Present: Other (comment) Fae Pippin, SLP) PPS Coordinator present : Fae Pippin, SLP     Current Status/Progress Goal Weekly Team Focus  Bowel/Bladder   Pt is continent B/B with episodes of incontinence  LBM 06/30/22   Will regain B/B continence  with normal pattern   Assit with toileting qshift/prn    Swallow/Nutrition/ Hydration   Dys 3 and nectar, upgraded to Provale 10cc on free water protocol   Sup A  tolerance of thin liquid    ADL's   min A UB self care, mod - max LB self care, max toielting, mod A squat pivot transfers, min sit to stand and static stand, no movement RUE, subluxation   Min A overall   RUE NMR,  balance, functional mobility, pt education, ADL training    Mobility   Bed mobility = ModA, with max cues ->MinA, squat pivot = MinA to R, sit<>stand = MinA +1, ambulation = MaxA +2 for RLE advancement and maintaining balance   MinA overall  Barriers: R shoulder sublux with pain, R motor activation with functional movements only; This week: R hemibody NMR, sitting and standing balance, transfers, ambulation, family educ, safety awareness    Communication   min  A ~ 80% intelligible   supervision   implementing compensatory strategies    Safety/Cognition/ Behavioral Observations               Pain   Denies pain at thia time   will be free from pain   Assess pt for pain qshift/prn    Skin   Skin is intact   Will maintain skin intergrity without breakdown  Assess skin qshiftprn for breakdown      Discharge Planning:  Discharging home with brother in law, daughter and other family members to assist 24/7.   Team Discussion: Patient with right hemiparesis post left MCA CVA with right shoulder pain.  Patient on target to meet rehab goals: yes, currently needs min assist for upper body care and mod - ax for lower body care with  min assist for toileting. Requires min- mod assist for squat pivots and min - mod assist for sit -stand.  Needs min assist for communication for carry over and use of compensatory strategies.  *See Care Plan and progress notes for long and short-term goals.   Revisions to Treatment Plan:  Provale cup H2O protocol   Teaching Needs: Safety, medications, dietary modifications, transfers, toileting, etc.  Current Barriers to Discharge: Decreased caregiver support and Home enviroment access/layout  Possible Resolutions to Barriers: Family education     Medical Summary Current Status: no return RUE, minimal return RLE, standing with therapy adn taking steps  Barriers  to Discharge: Morbid Obesity   Possible Resolutions to [employer] Focus: cont neuromuscular re education, still requires assist of 2 persons for mobilty   Continued Need for Acute Rehabilitation Level of Care: The patient requires daily medical management by a physician with specialized training in physical medicine and rehabilitation for the following reasons: Direction of a multidisciplinary physical rehabilitation program to maximize functional independence : Yes Medical management of patient stability for increased activity during  participation in an intensive rehabilitation regime.: Yes Analysis of laboratory values and/or radiology reports with any subsequent need for medication adjustment and/or medical intervention. : Yes   I attest that I was present, lead the team conference, and concur with the assessment and plan of the team.   Chana Bode B 07/02/2022, 8:02 AM

## 2022-07-03 LAB — GLUCOSE, CAPILLARY
Glucose-Capillary: 141 mg/dL — ABNORMAL HIGH (ref 70–99)
Glucose-Capillary: 136 mg/dL — ABNORMAL HIGH (ref 70–99)
Glucose-Capillary: 190 mg/dL — ABNORMAL HIGH (ref 70–99)
Glucose-Capillary: 223 mg/dL — ABNORMAL HIGH (ref 70–99)

## 2022-07-03 MED ORDER — SENNOSIDES-DOCUSATE SODIUM 8.6-50 MG PO TABS: 1.0000 | ORAL_TABLET | Freq: Every day | ORAL | Status: DC

## 2022-07-03 MED ORDER — SENNOSIDES-DOCUSATE SODIUM 8.6-50 MG PO TABS: 1.0000 | ORAL_TABLET | Freq: Every evening | ORAL | Status: DC | PRN

## 2022-07-03 NOTE — Progress Notes (Signed)
Occupational Therapy Session Note  Patient Details  Name: Jimmy Valentine MRN: [id number] Date of Birth: Mar 29, 1950  {CHL IP REHAB OT TIME CALCULATIONS:[id number]}   Short Term Goals: Week 2:  OT Short Term Goal 1 (Week 2): Pt will complete toilet transfers with min A or less. OT Short Term Goal 2 (Week 2): Pt will complete squat pivot transfers to tub bench in walk in shower with min A or less. OT Short Term Goal 3 (Week 2): Pt will don shirt with min A or less. OT Short Term Goal 4 (Week 2): Pt will don pants with mod A or less. OT Short Term Goal 5 (Week 2): Pt will be able to hold stand balance with min A while he uses his L hand to adjust pants over hips.  Skilled Therapeutic Interventions/Progress Updates:  Pt received *** for skilled OT session with focus on ***. Pt agreeable to interventions, demonstrating overall *** mood. Pt reported ***/10 pain, stating "***" in reference to ***. OT offering intermediate rest breaks and positioning suggestions throughout session to address pain/fatigue and maximize participation/safety in session.    Pt remained *** with all immediate needs met at end of session. Pt continues to be appropriate for skilled OT intervention to promote further functional independence.   Therapy Documentation Precautions:  Precautions Precautions: Fall Precaution Comments: R hemi; dysarthria Restrictions Weight Bearing Restrictions: No   Therapy/Group: Individual Therapy  Lou Cal, OTR/L, MSOT  07/03/2022, 10:07 PM

## 2022-07-03 NOTE — Progress Notes (Signed)
Occupational Therapy Weekly Progress Note  Patient Details  Name: Jimmy Valentine MRN: [id number] Date of Birth: [birth year]  Beginning of progress report period: Jun 24, 2022 End of progress report period: Jul 03, 2022 [place] Today's Date: 07/03/2022 OT Individual Time: 0830-0930 OT Individual Time Calculation (min): 60 min (unattended estim [phone])   Patient has met 4 of 4 short term goals.  Pt is making excellent progress with his mobility with ADL transfers using squat pivots and is improving daily with his sit to stands and stand balance which decreases the burden of care.  He continues to have no return of RUE but trace movement in scapula has been observed.  He can sit unsupported statically, with dynamic reaching or weight shifting to the R he needs moderate support.   Patient continues to demonstrate the following deficits: decreased cardiorespiratoy endurance, abnormal tone, and decreased sitting balance, decreased standing balance, decreased postural control, hemiplegia, and decreased balance strategies and therefore will continue to benefit from skilled OT intervention to enhance overall performance with BADL.  Patient progressing toward long term goals..  Continue plan of care.  OT Short Term Goals Week 1:  OT Short Term Goal 1 (Week 1): Pt will be able to sit EOB with close S and don shirt with mod A or less. OT Short Term Goal 1 - Progress (Week 1): Met OT Short Term Goal 2 (Week 1): Pt will be able to sit to stand from EOB with max A of 1 to  prep for LB dressing. OT Short Term Goal 2 - Progress (Week 1): Met OT Short Term Goal 3 (Week 1): Pt will complete squat pivot to toilet with max A of 1. OT Short Term Goal 3 - Progress (Week 1): Met OT Short Term Goal 4 (Week 1): Pt will be able to perform gentle self ROM on RUE with min A. OT Short Term Goal 4 - Progress (Week 1): Met Week 2:  OT Short Term Goal 1 (Week 2): Pt will complete toilet transfers with min A or less. OT Short  Term Goal 2 (Week 2): Pt will complete squat pivot transfers to tub bench in walk in shower with min A or less. OT Short Term Goal 3 (Week 2): Pt will don shirt with min A or less. OT Short Term Goal 4 (Week 2): Pt will don pants with mod A or less. OT Short Term Goal 5 (Week 2): Pt will be able to hold stand balance with min A while he uses his L hand to adjust pants over hips.  Skilled Therapeutic Interventions/Progress Updates:    Pt received in bed ready for therapy.  Focus of therapy session on functional mobility during self care.  Pt eager to shower today.  ADL Retraining: -shower on tub bench,  pt needed support in sitting balance as he bathed with mod A.   -transferred back to w/c to dress.  Donned shirt with min A and min cues, pants with max A.     Transfers: - increased A with bed mobility today needing mod -max to sit to EOB.   -squat pivot to w/c mod A of 1 (+2 for safety) and to tub bench with min A. -continues to need cues for forward weight shift and to keep hips lift elevated as he transfers, cued pt to count to 4 and not let hips lower until he was at count four to ensure he made it to the seat   Balance: -static sit supervision, mod  A with R lateral weight shift or reaching to the floor.  Pt was somewhat impulsive with movements in shower today needed max A to recover from LOB to R in sitting when he moved before therapist cued him.  -static standing with L hand on bed rail with cues to weight shift to the Left with mod A to support balance as 2nd person assisted with clothing management.   Neuromuscular Re-Education:     -unattended estim for 35 minutes on forearm for finger extensors and deltoid for subluxation using empi unit on small muscle atrophy setting.  Pt tolerated 25 intensity for 30 minutes. Estim unit removed by PT at start of next session.   Pt resting in w/c with all needs met. Alarm set and call light in reach.    Therapy Documentation Precautions:   Precautions Precautions: Fall Precaution Comments: R hemi; dysarthria Restrictions Weight Bearing Restrictions: No    Vital Signs: Therapy Vitals Temp: 98 F (36.7 C) Pulse Rate: 65 Resp: 18 BP: (Abnormal) 157/73 Patient Position (if appropriate): Lying Oxygen Therapy SpO2: 96 % O2 Device: Room Air Pain: Pain Assessment Pain Scale: 0-10 Pain Score: 0-No pain ADL: ADL Grooming: Setup Upper Body Bathing: Minimal assistance Where Assessed-Upper Body Bathing: Shower Lower Body Bathing: Moderate assistance Where Assessed-Lower Body Bathing: Shower Upper Body Dressing: Minimal assistance, Moderate cueing Where Assessed-Upper Body Dressing: Wheelchair Lower Body Dressing: Maximal assistance Where Assessed-Lower Body Dressing: Wheelchair Toileting: Dependent Toilet Transfer: Dependent Statistician Method: Other (comment) (STEDY +2) Walk-In Shower Transfer: Minimal assistance, Moderate assistance Film/video editor Method: Administrator: Emergency planning/management officer, Grab bars ADL Comments: .    Therapy/Group: Individual Therapy  Flower Franko 07/03/2022, 8:24 AM

## 2022-07-03 NOTE — Progress Notes (Signed)
Physical Therapy Weekly Progress Note  Patient Details  Name: Jimmy Valentine MRN: [id number] Date of Birth: [birth year]  Beginning of progress report period: {Time; dates multiple:304500300} End of progress report period: {Time; dates multiple:304500300}  {CHL IP REHAB PT TIME CALCULATION:304800500}  Patient has met {number 1-5:22450} of {number 1-5:20334} short term goals.  ***  Patient continues to demonstrate the following deficits {impairments:3041632} and therefore will continue to benefit from skilled PT intervention to increase functional independence with mobility.  Patient {LTG progression:3041653}.  {plan of NWGN:5621308}  PT Short Term Goals Week 1:  PT Short Term Goal 1 (Week 1): Pt will complete bed mobility with modA PT Short Term Goal 2 (Week 1): Pt will complete bed<>chair transfers with maxA of 1 person PT Short Term Goal 3 (Week 1): Pt will maintain unsupported sitting balance for 30 seconds PT Short Term Goal 4 (Week 1): Pt will participate in functional outcome measure to assess falls risk  Skilled Therapeutic Interventions/Progress Updates:      Therapy Documentation Precautions:  Precautions Precautions: Fall Precaution Comments: R hemi; dysarthria Restrictions Weight Bearing Restrictions: No General:   Vital Signs: Therapy Vitals Temp: 98 F (36.7 C) Pulse Rate: 65 Resp: 18 BP: (!) 157/73 Patient Position (if appropriate): Lying Oxygen Therapy SpO2: 96 % O2 Device: Room Air Pain:   Vision/Perception     Mobility:   Locomotion :    Trunk/Postural Assessment :    Balance:   Exercises:   Other Treatments:     Jimmy Valentine 07/03/2022, 7:46 AM

## 2022-07-03 NOTE — Progress Notes (Signed)
Speech Language Pathology Weekly Progress and Session Note  Patient Details  Name: Jimmy Valentine MRN: [id number] Date of Birth: [birth year]  Beginning of progress report period: Jun 26, 2022 End of progress report period: Jul 03, 2022   Short Term Goals: Week 1: SLP Short Term Goal 1 (Week 1): Patient will consume current diet without overt s/s of aspiration with supervision level verbal cues for use of swallowing compensatory strategies. SLP Short Term Goal 1 - Progress (Week 1): Met SLP Short Term Goal 2 (Week 1): Patient will demonstrate 80% intelligibilty at the sentence level with min A verbal cues for use of speech intelligibility strategies. SLP Short Term Goal 2 - Progress (Week 1): Met SLP Short Term Goal 3 (Week 1): Pt will utilize compensatory word-finding strategies to repair communication breakdowns with >80% accuracy given min cues. SLP Short Term Goal 3 - Progress (Week 1): Progressing toward goal SLP Short Term Goal 4 (Week 1): Patient will consume thin liquid trials without overt s/s of aspiration with min A level verbal cues for use of swallowing compensatory strategies. SLP Short Term Goal 4 - Progress (Week 1): Met    New Short Term Goals: Week 2: SLP Short Term Goal 1 (Week 2): Pt will utilize compensatory word-finding strategies to repair communication breakdowns with >80% accuracy given min cues. SLP Short Term Goal 2 (Week 2): Patient will consume Dys 3 diet and thin liquids  without overt s/s of aspiration with supervision level verbal cues for use of swallowing compensatory strategies. SLP Short Term Goal 3 (Week 2): Pt will consume regular trials with efficient oral prep and transit and use of compensatory swallow strategies to clear oral residue given supervision assistance. SLP Short Term Goal 4 (Week 2): Patient will demonstrate 90% intelligibilty at the sentence level with min A verbal cues for use of speech intelligibility strategies.  Weekly Progress  Updates:  Patient has made progressive gains and has met 3 of 4 STGs this reporting period. Patient is currently completing speech and language tasks with overall min A and min cues in regards to repairing communication breakdowns and improving speech intelligibility. Patient is currently consuming a Dys 3 diet with thin liquids with supervision  to implement swallowing compensatory strategies. Patient and family education is ongoing. Patient would benefit from continued skilled SLP intervention to maximize communication and swallowing functioning and overall functional independence prior to discharge.      Intensity: Minumum of 1-2 x/day, 30 to 90 minutes Frequency: 1 to 3 out of 7 days Duration/Length of Stay: 6/20 Treatment/Interventions: Dysphagia/aspiration precaution training;Speech/Language facilitation;Internal/external aids;Functional tasks;Patient/family education;Multimodal communication approach;Therapeutic Exercise  General    Pain Pain Assessment Pain Scale: 0-10 Pain Score: 0-No pain   Ellery Plunk 07/03/2022, 11:22 AM

## 2022-07-03 NOTE — Progress Notes (Signed)
Physical Therapy Session Note  Patient Details  Name: Jimmy Valentine MRN: [id number] Date of Birth: [birth year]  Today's Date: 07/03/2022 PT Individual Time: 6213-0865 PT Individual Time Calculation (min): 75 min   Short Term Goals: Week 1:  PT Short Term Goal 1 (Week 1): Pt will complete bed mobility with modA PT Short Term Goal 2 (Week 1): Pt will complete bed<>chair transfers with maxA of 1 person PT Short Term Goal 3 (Week 1): Pt will maintain unsupported sitting balance for 30 seconds PT Short Term Goal 4 (Week 1): Pt will participate in functional outcome measure to assess falls risk  Skilled Therapeutic Interventions/Progress Updates: Patient in [facility] on entrance to room. Patient alert and agreeable to PT session.   Patient reported no pain or complaints at beginning of session.  Therapeutic Activity: Transfers: Pt performed sit<>stand transfer throughout session with light minA-minA to lite gait handle. VC provided to ensure patient LE are in ready position.  Gait Training: Lite Gait harness donned. DF and leg wrap donned as well. Pt ambulated 66' x 2 in the lite gait +2 . PTA on R side to assist in facilitation of R LE advancement and weight shifting. Patient provided with cues to cues for patient to laterally weight shift in order to advance contralateral LE though swing phase, and to increase foot clearance. Patient required max cues to shift to L side as this was a different mechanical feeling vs how gait trials have been in the past with out harness assistance. Patient noted to give minA to advance R LE to mid-swing, but required totalA to advance through rest of phase (not consistent). Patient reported feeling like he could feel self advance R LE as indicated. Patient required 2 rest breaks throughout both gait trials. Patient noted to required close supervision for safety in order to prevent buckling per presentation of maintaining quad activation in stance (still in flexion and  cannot get into full terminal without tactile facilitation and/or VC).  Patient in [facility] at end of session with brakes locked, belt alarm set, and all needs within reach.      Therapy Documentation Precautions:  Precautions Precautions: Fall Precaution Comments: R hemi; dysarthria Restrictions Weight Bearing Restrictions: No  Therapy/Group: Individual Therapy  Berna Gitto PTA 07/03/2022, 3:16 PM

## 2022-07-03 NOTE — Progress Notes (Signed)
PROGRESS NOTE   Subjective/Complaints:  No issues overnite , pt has inc BM x 2 last noc and cont BM this am  Discussed BP management   ROS: Patient denies CP, SOB, N/V/D, no bowel problems   Objective:   No results found. No results for input(s): "WBC", "HGB", "HCT", "PLT" in the last 72 hours.  No results for input(s): "NA", "K", "CL", "CO2", "GLUCOSE", "BUN", "CREATININE", "CALCIUM" in the last 72 hours.   Intake/Output Summary (Last 24 hours) at 07/03/2022 0703 Last data filed at 07/03/2022 0459 Gross per 24 hour  Intake 594 ml  Output 500 ml  Net 94 ml         Physical Exam: Vital Signs Blood pressure (!) 157/73, pulse 65, temperature 98 F (36.7 C), resp. rate 18, height 5\' 9"  (1.753 m), weight 97.5 kg, SpO2 96 %.  General: No acute distress Mood and affect are appropriate Heart: Regular rate and rhythm no rubs murmurs or extra sounds Lungs: Clear to auscultation, breathing unlabored, no rales or wheezes Abdomen: Positive bowel sounds, soft nontender to palpation, nondistended Extremities: No clubbing, cyanosis, or edema   Skin: No evidence of breakdown, no evidence of rash Neurologic: word finding deficits, speech non-fluent. Generally able to convey thoughts. Cranial nerves II through XII intact, motor strength is 5/5 in left and still 0/5 right  deltoid, bicep, tricep, grip. 1/5 hip flexor, knee extensors, 0/5 ankle dorsiflexor and 2- plantar flexor. Right hamstring feels tight Sensory exam normal sensation to light touch  in bilateral upper and lower extremities Cerebellar exam weakness on right precludes testing  Musculoskeletal: Full range of motion in all 4 extremities. No joint swelling,     Assessment/Plan: 1. Functional deficits which require 3+ hours per day of interdisciplinary therapy in a comprehensive inpatient rehab setting. Physiatrist is providing close team supervision and 24 hour  management of active medical problems listed below. Physiatrist and rehab team continue to assess barriers to discharge/monitor patient progress toward functional and medical goals  Care Tool:  Bathing    Body parts bathed by patient: Abdomen, Chest, Right arm, Front perineal area, Right upper leg, Left upper leg, Face   Body parts bathed by helper: Left arm, Buttocks, Right lower leg, Left lower leg     Bathing assist Assist Level: Moderate Assistance - Patient 50 - 74%     Upper Body Dressing/Undressing Upper body dressing   What is the patient wearing?: Pull over shirt    Upper body assist Assist Level: Moderate Assistance - Patient 50 - 74%    Lower Body Dressing/Undressing Lower body dressing      What is the patient wearing?: Incontinence brief, Pants     Lower body assist Assist for lower body dressing: Total Assistance - Patient < 25%     Toileting Toileting    Toileting assist Assist for toileting: 2 Helpers     Transfers Chair/bed transfer  Transfers assist  Chair/bed transfer activity did not occur: Safety/medical concerns  Chair/bed transfer assist level: Moderate Assistance - Patient 50 - 74%     Locomotion Ambulation   Ambulation assist   Ambulation activity did not occur: Safety/medical concerns  Walk 10 feet activity   Assist  Walk 10 feet activity did not occur: Safety/medical concerns        Walk 50 feet activity   Assist Walk 50 feet with 2 turns activity did not occur: Safety/medical concerns         Walk 150 feet activity   Assist Walk 150 feet activity did not occur: Safety/medical concerns         Walk 10 feet on uneven surface  activity   Assist Walk 10 feet on uneven surfaces activity did not occur: Safety/medical concerns         Wheelchair     Assist Is the patient using a wheelchair?: Yes Type of Wheelchair: Manual    Wheelchair assist level: Minimal Assistance - Patient >  75% Max wheelchair distance: 50 ft    Wheelchair 50 feet with 2 turns activity    Assist        Assist Level: Dependent - Patient 0%   Wheelchair 150 feet activity     Assist      Assist Level: Dependent - Patient 0%   Blood pressure (!) 157/73, pulse 65, temperature 98 F (36.7 C), resp. rate 18, height 5\' 9"  (1.753 m), weight 97.5 kg, SpO2 96 %.  Medical Problem List and Plan: 1. 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 Large basal ganglia infarct. 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              -patient may  shower             -ELOS/Goals: 6/20             -Continue WHO and PRAFO             -Continue CIR therapies including PT, OT, SLP   2.  Antithrombotics: -DVT/anticoagulation:  Pharmaceutical: Lovenox 4omg daily              -antiplatelet therapy: Aspirin and Plavix for three months followed by aspirin alone (started 5/20)   3. Pain Management: Tylenol, Robaxin as needed   4. Mood/Behavior/Sleep: LCSW to evaluate and provide emotional support             -antipsychotic agents: n/a             -trazodone as needed   5. Neuropsych/cognition: This patient is capable of making decisions on his own behalf.   6. Skin/Wound Care: Routine skin care checks   7. Fluids/Electrolytes/Nutrition: Routine Is and Os and follow-up chemistries             -continue dys3/nectar thick; SLP eval   8: Hypertension: monitor TID and prn (home meds: amlodipine 10 mg QD, valsartan 320 mg daily)             -continue irbesartan 37.5 mg daily Vitals:   07/02/22 1939 07/03/22 0500  BP: (!) 158/74 (!) 157/73  Pulse: 67 65  Resp: 18 18  Temp: 97.6 F (36.4 C) 98 F (36.7 C)  SpO2: 98% 96%   -5/31 elevated systolic will increase irbesartan to 75mg  today    9: Hyperlipidemia: continue high intensity statin, change  simvastatin to atorvastatin 80mg  daily   10: Hyponatremia, mild Na 132: follow-up BMP, will fluid restrict - recheck on 5/27- improving cont fluid restriction , renal status stable       Latest Ref Rng & Units 06/29/2022  5:58 AM 06/25/2022    5:54 AM 06/21/2022    5:45 AM  BMP  Glucose 70 - 99 mg/dL 161  096  045   BUN 8 - 23 mg/dL 16  17  20    Creatinine 0.61 - 1.24 mg/dL 4.09  8.11  9.14   Sodium 135 - 145 mmol/L 132  128  132   Potassium 3.5 - 5.1 mmol/L 3.9  4.2  3.7   Chloride 98 - 111 mmol/L 101  95  96   CO2 22 - 32 mmol/L 24  21    Calcium 8.9 - 10.3 mg/dL 8.7  8.8      11: History of gout: restart home colchicine   12: DM type II: HGB A1C 6.7, CBGs QID and SSI; carb  modified diet when advanced (home meds include metformin 1000 mg BID) CBG (last 3)  Recent Labs    07/02/22 1707 07/02/22 2105 07/03/22 0623  GLUCAP 137* 189* 141*     5/31 good control    13: RLE/RUE weakness:normal LE  venous duplex   14: Tobacco use: occasional cigars, advise cessation   15. Constipation             -Miralax daily,add Senna S , sorbitol today    -BM this am cont 2 incont BM last noc pt refused senna S last noc  , reduce senna S to 1 po qhs prn  LOS: 9 days A FACE TO FACE EVALUATION WAS PERFORMED  Erick Colace 07/03/2022, 7:03 AM

## 2022-07-03 NOTE — Progress Notes (Signed)
Physical Therapy Session Note  Patient Details  Name: Jimmy Valentine MRN: [id number] Date of Birth: 1950-02-07  Today's Date: 07/03/2022 PT Individual Time: 1301-1415 PT Individual Time Calculation (min): 74 min   Short Term Goals: Week 1:  PT Short Term Goal 1 (Week 1): Pt will complete bed mobility with modA PT Short Term Goal 1 - Progress (Week 1): Met PT Short Term Goal 2 (Week 1): Pt will complete bed<>chair transfers with maxA of 1 person PT Short Term Goal 2 - Progress (Week 1): Met PT Short Term Goal 3 (Week 1): Pt will maintain unsupported sitting balance for 30 seconds PT Short Term Goal 3 - Progress (Week 1): Met PT Short Term Goal 4 (Week 1): Pt will participate in functional outcome measure to assess falls risk PT Short Term Goal 4 - Progress (Week 1): Met Week 2:     Skilled Therapeutic Interventions/Progress Updates:  Patient seated upright in w/c on entrance to room. Patient alert and agreeable to PT session.  Pt taken outside for change in scenery, fresh air, and encourage continued engagement.   Patient with no pain complaint at start of session.  Therapeutic Activity: Bed Mobility: Pt performed sit-->supine with ModA for positioning and technique as well as bringing RLE to bed surface. Is able to complete sit>sidelying>supine with max cues. Transfers/ NMR: Pt performed sit<>stand transfers to RW and stand pivot transfers using bedrail with Min/ ModA.  Provided vc/ tc for technique. Standing balance challenged while using RW with cues and manual tapping to facilitate quad activation for extension. Is able to produce TKE twice with no assist and then with fatigue requires Min/ ModA to reach. Progressed to lateral weight shifting to promote balance as well as understanding of use of RW.   Gait Training/ NMR:  Pt taken outside of [facility] entrance with TotA for time. Pt introduced to RW with application of saddle splint for RUE. Following instruction and visual demonstration  of use, pt ambulated 12 ft using RW with ModA for balance, RLE advancement, and mgmt of RW. Demonstrated additional need for assist with balance d/t using RW for first time over uneven concrete surface. +2 available for w/c follow. Provided vc/ tc throughout for attempt to increase initiation of RLE advancement, then R knee quad activation. Is able to maintain flexed knee with min guard to prevent buckling which is not noted. Does require physical assist into TKE prior to stance phase. Very slow to progress with first time use of RW.   NMR performed for improvements in motor control and coordination, balance, sequencing, judgement, and self confidence/ efficacy in performing all aspects of mobility at highest level of independence.   Wheelchair Mobility:  Pt educated in propel of standard wheelchair over 15 feet with MinA initially and improving to supervision for straight path. Introduced to four cones spaced 5 ft apart for slalom obstacle course. Pt is able to weave with L turns requiring no cues and supervision but then demos R side inattention and runs over cones when demonstrating R turns around cones. With vc for maintaining visual contact with cones during turns around cones to R side, pt is able to improve to supervision.  Patient supine in bed at end of session with brakes locked, bed alarm set, and all needs within reach. Recliner positioned for pt related visitor on their way to visit.    Therapy Documentation Precautions:  Precautions Precautions: Fall Precaution Comments: R hemi; dysarthria Restrictions Weight Bearing Restrictions: No General:   Vital  Signs: Therapy Vitals Temp: 98.2 F (36.8 C) Temp Source: Oral Pulse Rate: 84 Resp: 18 BP: 133/72 Patient Position (if appropriate): Lying Oxygen Therapy SpO2: 95 % O2 Device: Room Air Pain:  No complaint of pain this session.     Therapy/Group: Individual Therapy  Loel Dubonnet 07/03/2022, 5:12 PM

## 2022-07-04 LAB — GLUCOSE, CAPILLARY
Glucose-Capillary: 134 mg/dL — ABNORMAL HIGH (ref 70–99)
Glucose-Capillary: 144 mg/dL — ABNORMAL HIGH (ref 70–99)
Glucose-Capillary: 191 mg/dL — ABNORMAL HIGH (ref 70–99)
Glucose-Capillary: 154 mg/dL — ABNORMAL HIGH (ref 70–99)

## 2022-07-04 NOTE — Progress Notes (Signed)
PROGRESS NOTE   Subjective/Complaints:  Pt in bed resting well. No new issues. Ready for some more therapy this weekend. Wearing WHO at night.   ROS: Patient denies fever, rash, sore throat, blurred vision, dizziness, nausea, vomiting, diarrhea, cough, shortness of breath or chest pain, joint or back/neck pain, headache, or mood change.   Objective:   No results found. No results for input(s): "WBC", "HGB", "HCT", "PLT" in the last 72 hours.  No results for input(s): "NA", "K", "CL", "CO2", "GLUCOSE", "BUN", "CREATININE", "CALCIUM" in the last 72 hours.   Intake/Output Summary (Last 24 hours) at 07/04/2022 1151 Last data filed at 07/04/2022 0700 Gross per 24 hour  Intake 714 ml  Output 400 ml  Net 314 ml        Physical Exam: Vital Signs Blood pressure (!) 149/75, pulse 65, temperature 98.1 F (36.7 C), temperature source Oral, resp. rate 17, height 5\' 9"  (1.753 m), weight 97.5 kg, SpO2 99 %.  Constitutional: No distress . Vital signs reviewed. HEENT: NCAT, EOMI, oral membranes moist Neck: supple Cardiovascular: RRR without murmur. No JVD    Respiratory/Chest: CTA Bilaterally without wheezes or rales. Normal effort    GI/Abdomen: BS +, non-tender, non-distended Ext: no clubbing, cyanosis, or edema Psych: pleasant and cooperative  Skin: No evidence of breakdown, no evidence of rash Neurologic: word finding deficits, speech more fluent. cranial nerves II through XII intact, motor strength is 5/5 in left and still 0/5 right  deltoid, bicep, tricep, grip. 1/5 hip flexor, knee extensors, 0/5 ankle dorsiflexor and 2- plantar flexor. Extensor tone RLE with PROM  Sensory exam normal sensation to light touch  in bilateral upper and lower extremities Cerebellar exam weakness on right precludes testing  Musculoskeletal: Full range of motion in all 4 extremities. No joint swelling,     Assessment/Plan: 1. Functional deficits  which require 3+ hours per day of interdisciplinary therapy in a comprehensive inpatient rehab setting. Physiatrist is providing close team supervision and 24 hour management of active medical problems listed below. Physiatrist and rehab team continue to assess barriers to discharge/monitor patient progress toward functional and medical goals  Care Tool:  Bathing    Body parts bathed by patient: Abdomen, Chest, Right arm, Front perineal area, Right upper leg, Left upper leg, Face   Body parts bathed by helper: Left arm, Buttocks, Right lower leg, Left lower leg     Bathing assist Assist Level: Moderate Assistance - Patient 50 - 74%     Upper Body Dressing/Undressing Upper body dressing   What is the patient wearing?: Pull over shirt    Upper body assist Assist Level: Minimal Assistance - Patient > 75%    Lower Body Dressing/Undressing Lower body dressing      What is the patient wearing?: Incontinence brief, Pants     Lower body assist Assist for lower body dressing: Maximal Assistance - Patient 25 - 49%     Toileting Toileting    Toileting assist Assist for toileting: 2 Helpers     Transfers Chair/bed transfer  Transfers assist  Chair/bed transfer activity did not occur: Safety/medical concerns  Chair/bed transfer assist level: Moderate Assistance - Patient 50 - 74%  Locomotion Ambulation   Ambulation assist   Ambulation activity did not occur: Safety/medical concerns          Walk 10 feet activity   Assist  Walk 10 feet activity did not occur: Safety/medical concerns        Walk 50 feet activity   Assist Walk 50 feet with 2 turns activity did not occur: Safety/medical concerns         Walk 150 feet activity   Assist Walk 150 feet activity did not occur: Safety/medical concerns         Walk 10 feet on uneven surface  activity   Assist Walk 10 feet on uneven surfaces activity did not occur: Safety/medical concerns          Wheelchair     Assist Is the patient using a wheelchair?: Yes Type of Wheelchair: Manual    Wheelchair assist level: Minimal Assistance - Patient > 75% Max wheelchair distance: 50 ft    Wheelchair 50 feet with 2 turns activity    Assist        Assist Level: Dependent - Patient 0%   Wheelchair 150 feet activity     Assist      Assist Level: Dependent - Patient 0%   Blood pressure (!) 149/75, pulse 65, temperature 98.1 F (36.7 C), temperature source Oral, resp. rate 17, height 5\' 9"  (1.753 m), weight 97.5 kg, SpO2 99 %.  Medical Problem List and Plan: 1. 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 Large basal ganglia infarct. 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              -patient may  shower             -ELOS/Goals: 6/20             -Continue WHO and PRAFO             -Continue CIR therapies including PT, OT, and SLP    2.  Antithrombotics: -DVT/anticoagulation:  Pharmaceutical: Lovenox 4omg daily              -antiplatelet therapy: Aspirin and Plavix for three months followed by aspirin alone (started 5/20)   3. Pain Management: Tylenol, Robaxin as needed   4. Mood/Behavior/Sleep: LCSW to evaluate and provide emotional support             -antipsychotic agents: n/a             -trazodone as needed   5. Neuropsych/cognition: This patient is capable of making decisions on his own behalf.   6. Skin/Wound Care: Routine skin care checks   7. Fluids/Electrolytes/Nutrition: Routine Is and Os and follow-up chemistries             -continue dys3/advanced to thin liquids; SLP following   8: Hypertension: monitor TID and prn (home meds: amlodipine 10 mg QD, valsartan 320 mg daily)             -continue irbesartan 37.5 mg daily Vitals:   07/03/22 1957 07/04/22 0615  BP: (!) 142/70 (!) 149/75  Pulse: 74  65  Resp: 19 17  Temp: 97.6 F (36.4 C) 98.1 F (36.7 C)  SpO2: 96% 99%   -6/1   irbesartan increased to 75mg  daily yesterday--some improvement today   9: Hyperlipidemia: continue high intensity statin, change simvastatin to atorvastatin 80mg  daily   10: Hyponatremia, mild  Na 132: follow-up BMP, will fluid restrict - recheck on 5/27- improving cont fluid restriction , renal status stable    6/1 f/u labs Monday     Latest Ref Rng & Units 06/29/2022    5:58 AM 06/25/2022    5:54 AM 06/21/2022    5:45 AM  BMP  Glucose 70 - 99 mg/dL 161  096  045   BUN 8 - 23 mg/dL 16  17  20    Creatinine 0.61 - 1.24 mg/dL 4.09  8.11  9.14   Sodium 135 - 145 mmol/L 132  128  132   Potassium 3.5 - 5.1 mmol/L 3.9  4.2  3.7   Chloride 98 - 111 mmol/L 101  95  96   CO2 22 - 32 mmol/L 24  21    Calcium 8.9 - 10.3 mg/dL 8.7  8.8      11: History of gout: restart home colchicine   12: DM type II: HGB A1C 6.7, CBGs QID and SSI; carb  modified diet when advanced (home meds include metformin 1000 mg BID) CBG (last 3)  Recent Labs    07/03/22 2042 07/04/22 0617 07/04/22 1105  GLUCAP 223* 134* 144*    6/1 variable to borderline control--observe today   13: RLE/RUE weakness:normal LE  venous duplex   14: Tobacco use: occasional cigars, advise cessation   15. Constipation             -resolved. Had bm this morning type 5  -only on prn senokot-s and sorbitol LOS: 10 days A FACE TO FACE EVALUATION WAS PERFORMED  Ranelle Oyster 07/04/2022, 11:51 AM

## 2022-07-04 NOTE — Progress Notes (Signed)
Physical Therapy Session Note  Patient Details  Name: Jimmy Valentine MRN: [id number] Date of Birth: Dec 12, 1950  Today's Date: 07/04/2022 PT Individual Time: 4098-1191 PT Individual Time Calculation (min): 69 min   Short Term Goals: Week 1:  PT Short Term Goals - Week 1 PT Short Term Goal 1 (Week 1): Pt will complete bed mobility with modA PT Short Term Goal 1 - Progress (Week 1): Met PT Short Term Goal 2 (Week 1): Pt will complete bed<>chair transfers with maxA of 1 person PT Short Term Goal 2 - Progress (Week 1): Met PT Short Term Goal 3 (Week 1): Pt will maintain unsupported sitting balance for 30 seconds PT Short Term Goal 3 - Progress (Week 1): Met PT Short Term Goal 4 (Week 1): Pt will participate in functional outcome measure to assess falls risk PT Short Term Goal 4 - Progress (Week 1): Met PT Short Term Goals - Week 2 PT Short Term Goal 1 (Week 2): Pt will complete bed mobility with MinA. PT Short Term Goal 2 (Week 2): Pt will complete bed<>chair transfers with ModA. PT Short Term Goal 3 (Week 2): pt will maintain unsupported sitting balance to intermittent CGA. PT Short Term Goal 4 (Week 2): Pt will ambulate at least 75 ft using RW with MinA. PT Short Term Goal 5 (Week 2): pt will initiate stair training.   Skilled Therapeutic Interventions/Progress Updates:  Patient seated upright in w/c on entrance to room. Patient alert and agreeable to PT session.   Patient with no pain complaint at start of session.  Therapeutic Activity: Bed Mobility: Pt performed sit-->supine with Min/ ModA for RLE to bed surface. VC/ tc required for sidelying to supine technique. Transfers: Pt performed sit<>stand transfers with MinA and requiring up to ModA to maintain balance once standing especially when fatigued. Stand pivot transfers throughout session with Min/ ModA +2 for HHA. Provided verbal cues for technique and sequencing balance with pivot stepping.   Wheelchair Mobility:  Pt propelled  wheelchair 80 feet with supervision. Demos difficulty with sequencing and correct use of LE to steer w/c at first. Progresses to improved smoothness of movement with increased coordination. Provided vc/ tc for technique throughout.  Gait Training:  Fitted for PLS AFO to assist with drop foot and improving foot clearance for potential improvement to ability to advance RLE. Pt ambulated 20' x1/ 35' x1/ 74' x1 using Mod/ MaxA for LE advancement on RLE with heavy HHA to +2 on LUE. Marland Kitchen Demonstrated need for facilitation of latera weight shift, upright posture, MaxA for RLE advancement but is able to demo increasing strength to maintain knee stability in stance phase with no buckle. Provided vc/ tc for throughout for above impairment corrections. Pt relates improved ease in ambulation with increased stability with use of AFO.   Neuromuscular Re-ed: NMR facilitated during session with focus on standing balance. Pt guided in sit<>stand transfers with focus on attaining midline orientation and maintaining equal WB over BLE. LUE support used intermittently for pt to maintain balance. With fatigue, demos increased lean to R. NMR performed for improvements in motor control and coordination, balance, sequencing, judgement, and self confidence/ efficacy in performing all aspects of mobility at highest level of independence.   Patient supine in bed at end of session with brakes locked, bed alarm set, and all needs within reach.   Therapy Documentation Precautions:  Precautions Precautions: Fall Precaution Comments: R hemi; dysarthria Restrictions Weight Bearing Restrictions: No General:   Vital Signs:   Pain:  No pain related this session.   Therapy/Group: Individual Therapy  Loel Dubonnet PT, DPT, CSRS 07/04/2022, 6:31 PM

## 2022-07-05 LAB — GLUCOSE, CAPILLARY
Glucose-Capillary: 142 mg/dL — ABNORMAL HIGH (ref 70–99)
Glucose-Capillary: 163 mg/dL — ABNORMAL HIGH (ref 70–99)
Glucose-Capillary: 121 mg/dL — ABNORMAL HIGH (ref 70–99)
Glucose-Capillary: 179 mg/dL — ABNORMAL HIGH (ref 70–99)

## 2022-07-05 MED ORDER — METFORMIN HCL 500 MG PO TABS
500.0000 mg | ORAL_TABLET | Freq: Every day | ORAL | Status: DC
  Administered 2022-07-05 – 2022-07-26 (×22): 500 mg via ORAL
  Filled 2022-07-05 (×22): qty 1

## 2022-07-05 NOTE — Progress Notes (Addendum)
Occupational Therapy Session Note  Patient Details  Name: Jimmy Valentine MRN: [id number] Date of Birth: [birth year]  Today's Date: 07/05/2022 OT Individual Time: 4098-1191 OT Individual Time Calculation (min): 40 min    Short Term Goals: Week 2:  OT Short Term Goal 1 (Week 2): Pt will complete toilet transfers with min A or less. OT Short Term Goal 2 (Week 2): Pt will complete squat pivot transfers to tub bench in walk in shower with min A or less. OT Short Term Goal 3 (Week 2): Pt will don shirt with min A or less. OT Short Term Goal 4 (Week 2): Pt will don pants with mod A or less. OT Short Term Goal 5 (Week 2): Pt will be able to hold stand balance with min A while he uses his L hand to adjust pants over hips.  Skilled Therapeutic Interventions/Progress Updates:  Pt received resting in bed for skilled OT session with focus on BADL retraining. Pt agreeable to interventions, demonstrating overall pleasant mood. Pt with un-rated sciatic pain. OT offering intermediate rest breaks and positioning suggestions throughout session to address pain/fatigue and maximize participation/safety in session.   Pt requesting to shower this session. Pt comes to EOB with heavy Mod A+ use of bed features. Stedy used for all functional transfers for time management, pt requiring no more than CGA to use equipment.  Pt completes full-shower with HOH assistance provided to integrate RUE into tasks. Pt demonstrates improving sitting balance from previous shower, needing no more than intermediate CGA. Pt requires A to bathe B-lower legs and buttock.   Pt dependent for brief management and Max A for shorts, able to pull them upwards in stedy. Pt re-educated on hemi-dressing techniques for UB garments, requiring Min A to don shirt.   Pt remained sitting in recliner with all immediate needs met at end of session. Pt continues to be appropriate for skilled OT intervention to promote further functional independence.    Therapy Documentation Precautions:  Precautions Precautions: Fall Precaution Comments: R hemi; dysarthria Restrictions Weight Bearing Restrictions: No   Therapy/Group: Individual Therapy  Lou Cal, OTR/L, MSOT  07/05/2022, 5:35 AM

## 2022-07-05 NOTE — Progress Notes (Signed)
Speech Language Pathology Daily Session Note  Patient Details  Name: Jimmy Valentine MRN: [id number] Date of Birth: December 27, 1950  Today's Date: 07/05/2022 SLP Individual Time: 1430-1530 SLP Individual Time Calculation (min): 60 min  Short Term Goals: Week 2: SLP Short Term Goal 1 (Week 2): Pt will utilize compensatory word-finding strategies to repair communication breakdowns with >80% accuracy given min cues. SLP Short Term Goal 2 (Week 2): Patient will consume Dys 3 diet and thin liquids  without overt s/s of aspiration with supervision level verbal cues for use of swallowing compensatory strategies. SLP Short Term Goal 3 (Week 2): Pt will consume regular trials with efficient oral prep and transit and use of compensatory swallow strategies to clear oral residue given supervision assistance. SLP Short Term Goal 4 (Week 2): Patient will demonstrate 90% intelligibilty at the sentence level with min A verbal cues for use of speech intelligibility strategies.  Skilled Therapeutic Interventions:  Pt was seen for skilled ST targeting goals for dysphagia and communication.  Upon arrival, pt was resting in bed, awake, alert, and agreeable to participating in treatment.  SLP facilitated the session with skilled observations completed during cup and straw sips of thin liquids to monitor toleration of recent diet upgrade.  Pt had no overt s/s of aspiration with cup sips of thin liquids.  Sips via straw at times were followed by subswallows; however, vocal quality remained clear.  Pt reports overall good toleration of thin liquid with only x1 instance of choking which he attributed to taking too big of a sip.  SLP also facilitated the session with a novel barrier task targeting use of word finding and intelligibility strategies.  Pt was mod I for word finding throughout task but needed intermittent min assist verbal and/or visual cues to implement use of speech intelligibility strategies (specifically  overarticulating and increased vocal intensity).  Pt reports he is pleased with the progress in his communication but states that partners at times have difficulty understanding him either over the phone or when using the call bell.  SLP instructed pt to avoid unnecessary phone calls during times of day when his speech is less clear (most likely early morning or later in the evening) and to be very mindful of his strategy use when using the call bell).  Pt was left in bed with bed alarm set and call bell within reach.  Continue per current plan of car.    Pain Pain Assessment Pain Scale: 0-10 Pain Score: 0-No pain  Therapy/Group: Individual Therapy  Joandry Slagter, Melanee Spry 07/05/2022, 3:46 PM

## 2022-07-05 NOTE — Progress Notes (Signed)
PROGRESS NOTE   Subjective/Complaints:  Pt up in bed. No complaints. Feels that he's making some progress with therapy.   ROS: Patient denies fever, rash, sore throat, blurred vision, dizziness, nausea, vomiting, diarrhea, cough, shortness of breath or chest pain, joint or back/neck pain, headache, or mood change.   Objective:   No results found. No results for input(s): "WBC", "HGB", "HCT", "PLT" in the last 72 hours.  No results for input(s): "NA", "K", "CL", "CO2", "GLUCOSE", "BUN", "CREATININE", "CALCIUM" in the last 72 hours.   Intake/Output Summary (Last 24 hours) at 07/05/2022 1100 Last data filed at 07/05/2022 1007 Gross per 24 hour  Intake 714 ml  Output 750 ml  Net -36 ml        Physical Exam: Vital Signs Blood pressure (!) 148/80, pulse 74, temperature 98.1 F (36.7 C), temperature source Oral, resp. rate 17, height 5\' 9"  (1.753 m), weight 97.5 kg, SpO2 98 %.  Constitutional: No distress . Vital signs reviewed. HEENT: NCAT, EOMI, oral membranes moist Neck: supple Cardiovascular: RRR without murmur. No JVD    Respiratory/Chest: CTA Bilaterally without wheezes or rales. Normal effort    GI/Abdomen: BS +, non-tender, non-distended Ext: no clubbing, cyanosis, or edema Psych: pleasant and cooperative  Skin: No evidence of breakdown, no evidence of rash Neurologic: word finding deficits, speech more fluent. cranial nerves II through XII intact, motor strength is 5/5 in left and still 0/5 right  deltoid, bicep, tricep, grip. 1/5 hip flexor, knee extensors, 0/5 ankle dorsiflexor and 2- plantar flexor. Extensor tone RLE with PROM still present  Sensory exam normal sensation to light touch  in bilateral upper and lower extremities Cerebellar exam weakness on right precludes testing  Musculoskeletal: Full range of motion in all 4 extremities. No joint swelling,     Assessment/Plan: 1. Functional deficits which  require 3+ hours per day of interdisciplinary therapy in a comprehensive inpatient rehab setting. Physiatrist is providing close team supervision and 24 hour management of active medical problems listed below. Physiatrist and rehab team continue to assess barriers to discharge/monitor patient progress toward functional and medical goals  Care Tool:  Bathing    Body parts bathed by patient: Abdomen, Chest, Right arm, Front perineal area, Right upper leg, Left upper leg, Face   Body parts bathed by helper: Left arm, Buttocks, Right lower leg, Left lower leg     Bathing assist Assist Level: Moderate Assistance - Patient 50 - 74%     Upper Body Dressing/Undressing Upper body dressing   What is the patient wearing?: Pull over shirt    Upper body assist Assist Level: Minimal Assistance - Patient > 75%    Lower Body Dressing/Undressing Lower body dressing      What is the patient wearing?: Incontinence brief, Pants     Lower body assist Assist for lower body dressing: Maximal Assistance - Patient 25 - 49%     Toileting Toileting    Toileting assist Assist for toileting: 2 Helpers     Transfers Chair/bed transfer  Transfers assist  Chair/bed transfer activity did not occur: Safety/medical concerns  Chair/bed transfer assist level: Moderate Assistance - Patient 50 - 74%  Locomotion Ambulation   Ambulation assist   Ambulation activity did not occur: Safety/medical concerns          Walk 10 feet activity   Assist  Walk 10 feet activity did not occur: Safety/medical concerns        Walk 50 feet activity   Assist Walk 50 feet with 2 turns activity did not occur: Safety/medical concerns         Walk 150 feet activity   Assist Walk 150 feet activity did not occur: Safety/medical concerns         Walk 10 feet on uneven surface  activity   Assist Walk 10 feet on uneven surfaces activity did not occur: Safety/medical concerns          Wheelchair     Assist Is the patient using a wheelchair?: Yes Type of Wheelchair: Manual    Wheelchair assist level: Minimal Assistance - Patient > 75% Max wheelchair distance: 50 ft    Wheelchair 50 feet with 2 turns activity    Assist        Assist Level: Dependent - Patient 0%   Wheelchair 150 feet activity     Assist      Assist Level: Dependent - Patient 0%   Blood pressure (!) 148/80, pulse 74, temperature 98.1 F (36.7 C), temperature source Oral, resp. rate 17, height 5\' 9"  (1.753 m), weight 97.5 kg, SpO2 98 %.  Medical Problem List and Plan: 1. 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 Large basal ganglia infarct. 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              -patient may  shower             -ELOS/Goals: 6/20             -Continue WHO and PRAFO             -Continue CIR therapies including PT, OT, and SLP    2.  Antithrombotics: -DVT/anticoagulation:  Pharmaceutical: Lovenox 4omg daily              -antiplatelet therapy: Aspirin and Plavix for three months followed by aspirin alone (started 5/20)   3. Pain Management: Tylenol, Robaxin as needed   4. Mood/Behavior/Sleep: LCSW to evaluate and provide emotional support             -antipsychotic agents: n/a             -trazodone as needed   5. Neuropsych/cognition: This patient is capable of making decisions on his own behalf.   6. Skin/Wound Care: Routine skin care checks   7. Fluids/Electrolytes/Nutrition: Routine Is and Os and follow-up chemistries             -continue dys3/advanced to thin liquids; SLP following   8: Hypertension: monitor TID and prn (home meds: amlodipine 10 mg QD, valsartan 320 mg daily)             -continue irbesartan 37.5 mg daily Vitals:   07/04/22 2054 07/05/22 0415  BP: (!) 135/94 (!) 148/80  Pulse: 64  74  Resp: 18 17  Temp: 98.4 F (36.9 C) 98.1 F (36.7 C)  SpO2: 96% 98%   -6/2   irbesartan increased to 75mg  daily--demonstrating improvement   9: Hyperlipidemia: continue high intensity statin, change simvastatin to atorvastatin 80mg  daily   10: Hyponatremia, mild Na 132:  follow-up BMP, will fluid restrict - recheck on 5/27- improving cont fluid restriction , renal status stable    6/2 f/u labs Monday     Latest Ref Rng & Units 06/29/2022    5:58 AM 06/25/2022    5:54 AM 06/21/2022    5:45 AM  BMP  Glucose 70 - 99 mg/dL 161  096  045   BUN 8 - 23 mg/dL 16  17  20    Creatinine 0.61 - 1.24 mg/dL 4.09  8.11  9.14   Sodium 135 - 145 mmol/L 132  128  132   Potassium 3.5 - 5.1 mmol/L 3.9  4.2  3.7   Chloride 98 - 111 mmol/L 101  95  96   CO2 22 - 32 mmol/L 24  21    Calcium 8.9 - 10.3 mg/dL 8.7  8.8      11: History of gout: restart home colchicine   12: DM type II: HGB A1C 6.7, CBGs QID and SSI; carb  modified diet when advanced (home meds include metformin 1000 mg BID) CBG (last 3)  Recent Labs    07/04/22 1617 07/04/22 2131 07/05/22 0638  GLUCAP 191* 154* 142*    6/2 variable to borderline control-will resume metformin 500mg  daily   13: RLE/RUE weakness:normal LE  venous duplex   14: Tobacco use: occasional cigars, advise cessation   15. Constipation             -resolved. Had bm   type 5 6/1  -only on prn senokot-s and sorbitol LOS: 11 days A FACE TO FACE EVALUATION WAS PERFORMED  Ranelle Oyster 07/05/2022, 11:00 AM

## 2022-07-06 LAB — CBC
HCT: 44.2 % (ref 39.0–52.0)
Hemoglobin: 15.7 g/dL (ref 13.0–17.0)
MCH: 31.5 pg (ref 26.0–34.0)
MCHC: 35.5 g/dL (ref 30.0–36.0)
MCV: 88.6 fL (ref 80.0–100.0)
Platelets: 314 10*3/uL (ref 150–400)
RBC: 4.99 MIL/uL (ref 4.22–5.81)
RDW: 12 % (ref 11.5–15.5)
WBC: 11.4 10*3/uL — ABNORMAL HIGH (ref 4.0–10.5)
nRBC: 0 % (ref 0.0–0.2)

## 2022-07-06 LAB — BASIC METABOLIC PANEL
Anion gap: 9 (ref 5–15)
BUN: 13 mg/dL (ref 8–23)
CO2: 22 mmol/L (ref 22–32)
Calcium: 9 mg/dL (ref 8.9–10.3)
Chloride: 100 mmol/L (ref 98–111)
Creatinine, Ser: 0.8 mg/dL (ref 0.61–1.24)
GFR, Estimated: >60 mL/min (ref >60)
Glucose, Bld: 144 mg/dL — ABNORMAL HIGH (ref 70–99)
Potassium: 3.8 mmol/L (ref 3.5–5.1)
Sodium: 131 mmol/L — ABNORMAL LOW (ref 135–145)

## 2022-07-06 LAB — GLUCOSE, CAPILLARY
Glucose-Capillary: 153 mg/dL — ABNORMAL HIGH (ref 70–99)
Glucose-Capillary: 150 mg/dL — ABNORMAL HIGH (ref 70–99)
Glucose-Capillary: 133 mg/dL — ABNORMAL HIGH (ref 70–99)
Glucose-Capillary: 176 mg/dL — ABNORMAL HIGH (ref 70–99)

## 2022-07-06 NOTE — Progress Notes (Signed)
PROGRESS NOTE   Subjective/Complaints:  No issues overnite   ROS: Patient denies CP, SOB, N/V/D  Objective:   No results found. Recent Labs    07/06/22 0703  WBC 11.4*  HGB 15.7  HCT 44.2  PLT 314    Recent Labs    07/06/22 0703  NA 131*  K 3.8  CL 100  CO2 22  GLUCOSE 144*  BUN 13  CREATININE 0.80  CALCIUM 9.0     Intake/Output Summary (Last 24 hours) at 07/06/2022 0840 Last data filed at 07/06/2022 0865 Gross per 24 hour  Intake 1458 ml  Output 1050 ml  Net 408 ml         Physical Exam: Vital Signs Blood pressure 126/85, pulse 63, temperature (!) 97.5 F (36.4 C), temperature source Oral, resp. rate 17, height 5\' 9"  (1.753 m), weight 97.5 kg, SpO2 98 %.   General: No acute distress Mood and affect are appropriate Heart: Regular rate and rhythm no rubs murmurs or extra sounds Lungs: Clear to auscultation, breathing unlabored, no rales or wheezes Abdomen: Positive bowel sounds, soft nontender to palpation, nondistended Extremities: No clubbing, cyanosis, or edema   Skin: No evidence of breakdown, no evidence of rash Neurologic: word finding deficits, speech more fluent. cranial nerves II through XII intact, motor strength is 5/5 in left and still 0/5 right  deltoid, bicep, tricep, grip. 1/5 hip flexor, 3- knee extensors, 0/5 ankle dorsiflexor and 2- plantar flexor. Extensor tone RLE with PROM still present  Sensory exam normal sensation to light touch  in bilateral upper and lower extremities Cerebellar exam weakness on right precludes testing  Musculoskeletal: Full range of motion in all 4 extremities. No joint swelling,     Assessment/Plan: 1. Functional deficits which require 3+ hours per day of interdisciplinary therapy in a comprehensive inpatient rehab setting. Physiatrist is providing close team supervision and 24 hour management of active medical problems listed below. Physiatrist and  rehab team continue to assess barriers to discharge/monitor patient progress toward functional and medical goals  Care Tool:  Bathing    Body parts bathed by patient: Abdomen, Chest, Right arm, Front perineal area, Right upper leg, Left upper leg, Face   Body parts bathed by helper: Left arm, Buttocks, Right lower leg, Left lower leg     Bathing assist Assist Level: Moderate Assistance - Patient 50 - 74%     Upper Body Dressing/Undressing Upper body dressing   What is the patient wearing?: Pull over shirt    Upper body assist Assist Level: Minimal Assistance - Patient > 75%    Lower Body Dressing/Undressing Lower body dressing      What is the patient wearing?: Incontinence brief, Pants     Lower body assist Assist for lower body dressing: Maximal Assistance - Patient 25 - 49%     Toileting Toileting    Toileting assist Assist for toileting: 2 Helpers     Transfers Chair/bed transfer  Transfers assist  Chair/bed transfer activity did not occur: Safety/medical concerns  Chair/bed transfer assist level: Moderate Assistance - Patient 50 - 74%     Locomotion Ambulation   Ambulation assist   Ambulation activity did not  occur: Safety/medical concerns          Walk 10 feet activity   Assist  Walk 10 feet activity did not occur: Safety/medical concerns        Walk 50 feet activity   Assist Walk 50 feet with 2 turns activity did not occur: Safety/medical concerns         Walk 150 feet activity   Assist Walk 150 feet activity did not occur: Safety/medical concerns         Walk 10 feet on uneven surface  activity   Assist Walk 10 feet on uneven surfaces activity did not occur: Safety/medical concerns         Wheelchair     Assist Is the patient using a wheelchair?: Yes Type of Wheelchair: Manual    Wheelchair assist level: Minimal Assistance - Patient > 75% Max wheelchair distance: 50 ft    Wheelchair 50 feet with 2 turns  activity    Assist        Assist Level: Dependent - Patient 0%   Wheelchair 150 feet activity     Assist      Assist Level: Dependent - Patient 0%   Blood pressure 126/85, pulse 63, temperature (!) 97.5 F (36.4 C), temperature source Oral, resp. rate 17, height 5\' 9"  (1.753 m), weight 97.5 kg, SpO2 98 %.  Medical Problem List and Plan: 1. 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 Large basal ganglia infarct. 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              -patient may  shower             -ELOS/Goals: 6/20             -Continue WHO and PRAFO             -Continue CIR therapies including PT, OT, and SLP  Some improvement with knee ext strength  2.  Antithrombotics: -DVT/anticoagulation:  Pharmaceutical: Lovenox 4omg daily              -antiplatelet therapy: Aspirin and Plavix for three months followed by aspirin alone (started 5/20)   3. Pain Management: Tylenol, Robaxin as needed   4. Mood/Behavior/Sleep: LCSW to evaluate and provide emotional support             -antipsychotic agents: n/a             -trazodone as needed   5. Neuropsych/cognition: This patient is capable of making decisions on his own behalf.   6. Skin/Wound Care: Routine skin care checks   7. Fluids/Electrolytes/Nutrition: Routine Is and Os and follow-up chemistries             -continue dys3/advanced to thin liquids; SLP following   8: Hypertension: monitor TID and prn (home meds: amlodipine 10 mg QD, valsartan 320 mg daily)             -continue irbesartan 37.5 mg daily Vitals:   07/05/22 1920 07/06/22 0529  BP: (!) 147/82 126/85  Pulse: 73 63  Resp: 17 17  Temp: 98 F (36.7 C) (!) 97.5 F (36.4 C)  SpO2: 96% 98%   -6/2   irbesartan increased to 75mg  6/3 improved    9: Hyperlipidemia: continue high intensity statin, change  simvastatin to atorvastatin 80mg  daily   10: Hyponatremia, mild Na 132: follow-up BMP, will fluid restrict -  recheck on 5/27- improving cont fluid restriction , renal status stable    6/3 stable , mild hyponatremia , asymptomatic     Latest Ref Rng & Units 07/06/2022    7:03 AM 06/29/2022    5:58 AM 06/25/2022    5:54 AM  BMP  Glucose 70 - 99 mg/dL 956  213  086   BUN 8 - 23 mg/dL 13  16  17    Creatinine 0.61 - 1.24 mg/dL 5.78  4.69  6.29   Sodium 135 - 145 mmol/L 131  132  128   Potassium 3.5 - 5.1 mmol/L 3.8  3.9  4.2   Chloride 98 - 111 mmol/L 100  101  95   CO2 22 - 32 mmol/L 22  24  21    Calcium 8.9 - 10.3 mg/dL 9.0  8.7  8.8     11: History of gout: restart home colchicine   12: DM type II: HGB A1C 6.7, CBGs QID and SSI; carb  modified diet when advanced (home meds include metformin 1000 mg BID) CBG (last 3)  Recent Labs    07/05/22 1703 07/05/22 2049 07/06/22 0627  GLUCAP 121* 179* 153*   6/3 fair control    13: RLE/RUE weakness:normal LE  venous duplex   14: Tobacco use: occasional cigars, advise cessation   15. Constipation             -resolved. Had bm   type 5 6/1  -only on prn senokot-s and sorbitol LOS: 12 days A FACE TO FACE EVALUATION WAS PERFORMED  Erick Colace 07/06/2022, 8:40 AM

## 2022-07-06 NOTE — Progress Notes (Signed)
Physical Therapy Session Note  Patient Details  Name: Jimmy Valentine MRN: [id number] Date of Birth: 03/27/1950  Today's Date: 07/06/2022 PT Individual Time: 1350-1500 PT Individual Time Calculation (min): 70 min   Short Term Goals: Week 1:  PT Short Term Goal 1 (Week 1): Pt will complete bed mobility with modA PT Short Term Goal 1 - Progress (Week 1): Met PT Short Term Goal 2 (Week 1): Pt will complete bed<>chair transfers with maxA of 1 person PT Short Term Goal 2 - Progress (Week 1): Met PT Short Term Goal 3 (Week 1): Pt will maintain unsupported sitting balance for 30 seconds PT Short Term Goal 3 - Progress (Week 1): Met PT Short Term Goal 4 (Week 1): Pt will participate in functional outcome measure to assess falls risk PT Short Term Goal 4 - Progress (Week 1): Met Week 2:  PT Short Term Goal 1 (Week 2): Pt will complete bed mobility with MinA. PT Short Term Goal 2 (Week 2): Pt will complete bed<>chair transfers with ModA. PT Short Term Goal 3 (Week 2): pt will maintain unsupported sitting balance to intermittent CGA. PT Short Term Goal 4 (Week 2): Pt will ambulate at least 75 ft using RW with MinA. PT Short Term Goal 5 (Week 2): pt will initiate stair training. Week 3:     Skilled Therapeutic Interventions/Progress Updates:  Patient *** on entrance to room. Patient alert and agreeable to PT session.   Patient with no pain complaint at start of session.  Therapeutic Activity: Bed Mobility: Pt performed supine <> sit with ***. VC/ tc required for ***. Transfers: Pt performed sit<>stand and stand pivot transfers throughout session with ***. Provided verbal cues for***.  Gait Training:  Pt ambulated *** ft using *** with ***. Demonstrated ***. Provided vc/ tc for ***.  Neuromuscular Re-ed: NMR facilitated during session with focus on***. Pt guided in ***.  - guided in slow, controlled sit-->stand as well as slow, controlled descent with focus on maintaining midline and use of  both LEs.  -    NMR performed for improvements in motor control and coordination, balance, sequencing, judgement, and self confidence/ efficacy in performing all aspects of mobility at highest level of independence.   Squat pivot performed back to bed from w/c to pt's R side and able to complete with MinA and hand hold to bed rail. MinA for LLE to follow RLE back to bed with vc for adduction contraction as pt was performing earlier while seated upright in w/c.   Patient supin in bed at end of session with brakes locked, bed alarm set, and all needs within reach.   Therapy Documentation Precautions:  Precautions Precautions: Fall Precaution Comments: R hemi; dysarthria Restrictions Weight Bearing Restrictions: No General:   Vital Signs:  Pain:  No pain related this session. Pt states desire to lie down at end of session in order to change position from being seated upright all day for improved pain mgmt.   Therapy/Group: Individual Therapy  Loel Dubonnet PT, DPT, CSRS 07/06/2022, 5:57 PM

## 2022-07-06 NOTE — Progress Notes (Signed)
Speech Language Pathology Daily Session Note  Patient Details  Name: XAEDEN LAMARTINA MRN: [id number] Date of Birth: 26-May-1950  Today's Date: 07/06/2022 SLP Individual Time: 0800-0900 SLP Individual Time Calculation (min): 60 min  Short Term Goals: Week 2: SLP Short Term Goal 1 (Week 2): Pt will utilize compensatory word-finding strategies to repair communication breakdowns with >80% accuracy given min cues. SLP Short Term Goal 2 (Week 2): Patient will consume Dys 3 diet and thin liquids  without overt s/s of aspiration with supervision level verbal cues for use of swallowing compensatory strategies. SLP Short Term Goal 3 (Week 2): Pt will consume regular trials with efficient oral prep and transit and use of compensatory swallow strategies to clear oral residue given supervision assistance. SLP Short Term Goal 4 (Week 2): Patient will demonstrate 90% intelligibilty at the sentence level with min A verbal cues for use of speech intelligibility strategies.  Skilled Therapeutic Interventions:  Pt was seen in am to address dysphagia management and speech intelligibility. Pt was alert and seated upright in WC upon SLP arrival. SLP assisted pt in completion of oral hygiene which he completed at the sink. He was agreeable for regular solid trials. SLP presented graham crackers and peanut butter which pt administered. Pt presented with adequate bolus formation, transit and degluttion with little to no lingual residue which pt cleared indep. He consumed thin liquid via straw with anterior spillage to right side and immediate coughing in one opportunity likely 2/2 decreased bolus coordination. Pt with quick recovery and no other s/sx pen/asp with thin liquids via straw. Pt would benefit from trials of regular solid breakfast or lunch tray for possible diet advancements. SLP further addressed speech intelligibility. Pt recalled strategies of amplification indep. SLP reviewed other strategies including over  articulation and separating words. SLP engaged pt in structured conversational exchange with pt benefiting from min A initially with cues able to be faded to sup A with 90% speech intelligibility. Pt was left seated upright in WC with call button within reach and chair alarm active. SLP to continue POC.   Pain Pain Assessment Pain Scale: 0-10 Pain Score: 0-No pain  Therapy/Group: Individual Therapy  Renaee Munda 07/06/2022, 8:57 AM

## 2022-07-06 NOTE — Progress Notes (Signed)
Occupational Therapy Session Note  Patient Details  Name: Jimmy Valentine MRN: [id number] Date of Birth: Oct 08, 1950  Today's Date: 07/06/2022 OT Individual Time: 1045-1200 OT Individual Time Calculation (min): 75 min    Short Term Goals: Week 2:  OT Short Term Goal 1 (Week 2): Pt will complete toilet transfers with min A or less. OT Short Term Goal 2 (Week 2): Pt will complete squat pivot transfers to tub bench in walk in shower with min A or less. OT Short Term Goal 3 (Week 2): Pt will don shirt with min A or less. OT Short Term Goal 4 (Week 2): Pt will don pants with mod A or less. OT Short Term Goal 5 (Week 2): Pt will be able to hold stand balance with min A while he uses his L hand to adjust pants over hips.  Skilled Therapeutic Interventions/Progress Updates:    Pt received in bed ready for therapy.  Focus of therapy session on education with brother in law to have him be aware of pt's current status with bed mobility, transfers for first part of session then focus on balance and RUE NMR.  Family education; -pt sleeps in tall king bed, recommended switching to low profile mattresses -pt worked on scooting in bed with hip bridge to give more space to roll in bed with total A to stabilize R leg.   -pt gets out on L side of bed, so rolled to L side with cues to fully roll and min A to move legs -min A to sit up to EOB, but then pt lost balance posteriorly and needed max A to recovery -demonstrated to family pt completing squat pivot to R to wc and back to bed to L with mod A.  Had brother practice 1 transfer to R with me being a +2 A with mod A. Pt not lifting hips high enough so he was scooting vs squat pivot.  Had pt practice higher hip lift later on hi gym. -discussed BR layout and I requested measurements and pictures -pt and brother expressed they hope he is much more functional by the time of dc. -static standing in room using bed rail for support with light CGA,   Pt taken to  gym; -static standing in gym using rail and then transitioning to no UE support (pt could hold for 20 sec at a time and then needed to recover with hand on rail) -trialed some dynamic reach with L hand to simulate adjusting clothing around waist with min - mod A to support balance  -pt tolerated standing exercises for 15 minutes with 3 1 minute rest breaks.   -transferred to mat min A -RUE hand over hand guiding on arm with arm on table - felt slight movement in shoulder -used slippery maxi slide to decrease resistance with movement -applied estim to forearm for 15 minutes at intensity 28 on small muscle atrophy setting for finger and wrist extension.   Pt worked on actively trying to extend fingers with estim. After treatment, no active movement was elicited.  Pt transferred back to wc and returned to room.  Pt resting in wc with all needs met. Alarm set and call light in reach.        Therapy Documentation Precautions:  Precautions Precautions: Fall Precaution Comments: R hemi; dysarthria Restrictions Weight Bearing Restrictions: No  Vital Signs: Therapy Vitals Temp: (Abnormal) 97.5 F (36.4 C) Temp Source: Oral Pulse Rate: 63 Resp: 17 BP: 126/85 Patient Position (if appropriate): Lying Oxygen  Therapy SpO2: 98 % O2 Device: Room Air Pain: Pain Assessment Pain Scale: 0-10 Pain Score: 0-No pain ADL: ADL Grooming: Setup Upper Body Bathing: Minimal assistance Where Assessed-Upper Body Bathing: Shower Lower Body Bathing: Moderate assistance Where Assessed-Lower Body Bathing: Shower Upper Body Dressing: Minimal assistance, Minimal cueing Where Assessed-Upper Body Dressing: Wheelchair Lower Body Dressing: Maximal assistance Where Assessed-Lower Body Dressing: Wheelchair Toileting: Dependent Toilet Transfer: Dependent Statistician Method: Other (comment) (STEDY +2) Walk-In Shower Transfer: Minimal assistance, Moderate assistance Film/video editor Method: Naval architect: Emergency planning/management officer, Grab bars ADL Comments: .   Therapy/Group: Individual Therapy  Ladora Osterberg 07/06/2022, 9:25 AM

## 2022-07-07 LAB — GLUCOSE, CAPILLARY
Glucose-Capillary: 139 mg/dL — ABNORMAL HIGH (ref 70–99)
Glucose-Capillary: 170 mg/dL — ABNORMAL HIGH (ref 70–99)
Glucose-Capillary: 149 mg/dL — ABNORMAL HIGH (ref 70–99)
Glucose-Capillary: 141 mg/dL — ABNORMAL HIGH (ref 70–99)

## 2022-07-07 MED ORDER — SENNOSIDES-DOCUSATE SODIUM 8.6-50 MG PO TABS
1.0000 | ORAL_TABLET | Freq: Every day | ORAL | Status: DC
  Administered 2022-07-07 – 2022-07-20 (×12): 1 via ORAL
  Filled 2022-07-07 (×12): qty 1

## 2022-07-07 NOTE — Progress Notes (Signed)
Occupational Therapy Session Note  Patient Details  Name: Jimmy Valentine MRN: [id number] Date of Birth: 1950-06-22  Today's Date: 07/07/2022 OT Individual Time: 4098-1191 OT Individual Time Calculation (min): 75 min (unattended estim [id number])   Short Term Goals: Week 1:  OT Short Term Goal 1 (Week 1): Pt will be able to sit EOB with close S and don shirt with mod A or less. OT Short Term Goal 1 - Progress (Week 1): Met OT Short Term Goal 2 (Week 1): Pt will be able to sit to stand from EOB with max A of 1 to  prep for LB dressing. OT Short Term Goal 2 - Progress (Week 1): Met OT Short Term Goal 3 (Week 1): Pt will complete squat pivot to toilet with max A of 1. OT Short Term Goal 3 - Progress (Week 1): Met OT Short Term Goal 4 (Week 1): Pt will be able to perform gentle self ROM on RUE with min A. OT Short Term Goal 4 - Progress (Week 1): Met Week 2:  OT Short Term Goal 1 (Week 2): Pt will complete toilet transfers with min A or less. OT Short Term Goal 2 (Week 2): Pt will complete squat pivot transfers to tub bench in walk in shower with min A or less. OT Short Term Goal 3 (Week 2): Pt will don shirt with min A or less. OT Short Term Goal 4 (Week 2): Pt will don pants with mod A or less. OT Short Term Goal 5 (Week 2): Pt will be able to hold stand balance with min A while he uses his L hand to adjust pants over hips.  Skilled Therapeutic Interventions/Progress Updates:    Pt received in bed ready for therapy and a shower. +2 A from rehab tech to help with support and supply management. Focus of therapy session on hemiplegic strategies and balance.    ADL Retraining: -completed shower using long sponge to reach L arm and feet, A to wash bottom -improving using hemi dressing strategies to don clothing, continues to need total A over hips   Transfers: -bridging hips to scoot hip over in bed for improved positioning prior to sitting EOB mod A to support R leg -rolling to L in bed  with pt holding R arm and actively using his core strength min A -sidelying to sit with min A with pt pushing up with L arm  Balance: -sitting on tub bench with CGA as he reached toward his shins,  used long sponge for feet -static standing with L hand on grab bar min A and occasional cues to shift his weight to his L -tried having pt release his hand from bar (used bed rail for support during LB dressing) to help pull up pants but not safe, pt not able to hold balance without max A   Neuromuscular Re-Education:  -applied estim to deltoids and wrist and finger extensors using small muscle atrophy setting on intensity 28 on shoulder and 26 on forearm.  Observed pt for 5 minutes to ensure intensity setting tolerable. Pt sat in wc with unattended estim for 55 more minutes.  Pt instructed on how to turn off machine if needed. Returned to room and pt tolerated 60 min well.     Pt resting in w/c with all needs met. Alarm set and call light in reach.  Arm in arm trough.         Therapy Documentation Precautions:    fall   Pain: Pain Assessment  Pain Scale: 0-10 Pain Score: 6  Pain Location: Leg Pain Orientation: Left Pain Descriptors / Indicators: Sharp Pain Intervention(s): Repositioned ADL: ADL Grooming: Setup Upper Body Bathing: Supervision/safety (using long sponge to reach L arm) Where Assessed-Upper Body Bathing: Shower Lower Body Bathing: Minimal assistance (using long sponge to reach feet) Where Assessed-Lower Body Bathing: Shower Upper Body Dressing: Minimal assistance, Minimal cueing Where Assessed-Upper Body Dressing: Wheelchair Lower Body Dressing: Maximal assistance Where Assessed-Lower Body Dressing: Wheelchair Toileting: Dependent Toilet Transfer: Dependent Statistician Method: Other (comment) (STEDY +2) Walk-In Shower Transfer: Minimal assistance, Moderate assistance Film/video editor Method: Administrator: Emergency planning/management officer, Grab  bars ADL Comments: .   Therapy/Group: Individual Therapy  Ansel Ferrall 07/07/2022, 12:42 PM

## 2022-07-07 NOTE — Progress Notes (Signed)
PROGRESS NOTE   Subjective/Complaints:  Slept well, no BM x 2 d usually regular daily at home no laxative use   ROS: Patient denies CP, SOB, N/V/D  Objective:   No results found. Recent Labs    07/06/22 0703  WBC 11.4*  HGB 15.7  HCT 44.2  PLT 314     Recent Labs    07/06/22 0703  NA 131*  K 3.8  CL 100  CO2 22  GLUCOSE 144*  BUN 13  CREATININE 0.80  CALCIUM 9.0      Intake/Output Summary (Last 24 hours) at 07/07/2022 0755 Last data filed at 07/07/2022 0509 Gross per 24 hour  Intake 476 ml  Output 850 ml  Net -374 ml         Physical Exam: Vital Signs Blood pressure 134/71, pulse 62, temperature (!) 97.5 F (36.4 C), temperature source Oral, resp. rate 18, height 5\' 9"  (1.753 m), weight 97.5 kg, SpO2 95 %.   General: No acute distress Mood and affect are appropriate Heart: Regular rate and rhythm no rubs murmurs or extra sounds Lungs: Clear to auscultation, breathing unlabored, no rales or wheezes Abdomen: Positive bowel sounds, soft nontender to palpation, nondistended Extremities: No clubbing, cyanosis, or edema   Skin: No evidence of breakdown, no evidence of rash Neurologic: word finding deficits, speech more fluent. cranial nerves II through XII intact, motor strength is 5/5 in left and still 0/5 right  deltoid, bicep, tricep, grip. 1/5 hip flexor, 3- knee extensors, 1/5 ankle dorsiflexor and 2- plantar flexor. Extensor tone RLE with PROM still present  Sensory exam normal sensation to light touch  in bilateral upper and lower extremities Cerebellar exam weakness on right precludes testing  Musculoskeletal: Full range of motion in all 4 extremities. No joint swelling,     Assessment/Plan: 1. Functional deficits which require 3+ hours per day of interdisciplinary therapy in a comprehensive inpatient rehab setting. Physiatrist is providing close team supervision and 24 hour management of  active medical problems listed below. Physiatrist and rehab team continue to assess barriers to discharge/monitor patient progress toward functional and medical goals  Care Tool:  Bathing    Body parts bathed by patient: Abdomen, Chest, Right arm, Front perineal area, Right upper leg, Left upper leg, Face   Body parts bathed by helper: Left arm, Buttocks, Right lower leg, Left lower leg     Bathing assist Assist Level: Moderate Assistance - Patient 50 - 74%     Upper Body Dressing/Undressing Upper body dressing   What is the patient wearing?: Pull over shirt    Upper body assist Assist Level: Minimal Assistance - Patient > 75%    Lower Body Dressing/Undressing Lower body dressing      What is the patient wearing?: Incontinence brief, Pants     Lower body assist Assist for lower body dressing: Maximal Assistance - Patient 25 - 49%     Toileting Toileting    Toileting assist Assist for toileting: 2 Helpers     Transfers Chair/bed transfer  Transfers assist  Chair/bed transfer activity did not occur: Safety/medical concerns  Chair/bed transfer assist level: Moderate Assistance - Patient 50 - 74%  Locomotion Ambulation   Ambulation assist   Ambulation activity did not occur: Safety/medical concerns          Walk 10 feet activity   Assist  Walk 10 feet activity did not occur: Safety/medical concerns        Walk 50 feet activity   Assist Walk 50 feet with 2 turns activity did not occur: Safety/medical concerns         Walk 150 feet activity   Assist Walk 150 feet activity did not occur: Safety/medical concerns         Walk 10 feet on uneven surface  activity   Assist Walk 10 feet on uneven surfaces activity did not occur: Safety/medical concerns         Wheelchair     Assist Is the patient using a wheelchair?: Yes Type of Wheelchair: Manual    Wheelchair assist level: Minimal Assistance - Patient > 75% Max  wheelchair distance: 50 ft    Wheelchair 50 feet with 2 turns activity    Assist        Assist Level: Dependent - Patient 0%   Wheelchair 150 feet activity     Assist      Assist Level: Dependent - Patient 0%   Blood pressure 134/71, pulse 62, temperature (!) 97.5 F (36.4 C), temperature source Oral, resp. rate 18, height 5\' 9"  (1.753 m), weight 97.5 kg, SpO2 95 %.  Medical Problem List and Plan: 1. 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 Large basal ganglia infarct. 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              -patient may  shower             -ELOS/Goals: 6/20             -Continue WHO and PRAFO             -Continue CIR therapies including PT, OT, and SLP  Team conf in am   2.  Antithrombotics: -DVT/anticoagulation:  Pharmaceutical: Lovenox 4omg daily              -antiplatelet therapy: Aspirin and Plavix for three months followed by aspirin alone (started 5/20)   3. Pain Management: Tylenol, Robaxin as needed   4. Mood/Behavior/Sleep: LCSW to evaluate and provide emotional support             -antipsychotic agents: n/a             -trazodone as needed   5. Neuropsych/cognition: This patient is capable of making decisions on his own behalf.   6. Skin/Wound Care: Routine skin care checks   7. Fluids/Electrolytes/Nutrition: Routine Is and Os and follow-up chemistries             -continue dys3/advanced to thin liquids; SLP following   8: Hypertension: monitor TID and prn (home meds: amlodipine 10 mg QD, valsartan 320 mg daily)             -continue irbesartan 37.5 mg daily Vitals:   07/06/22 2015 07/07/22 0509  BP: 133/70 134/71  Pulse: 67 62  Resp: 18 18  Temp: (!) 97.4 F (36.3 C) (!) 97.5 F (36.4 C)  SpO2: 96% 95%   -6/2   irbesartan increased to 75mg , BP now controlled    9:  Hyperlipidemia: continue high intensity statin, change simvastatin to atorvastatin 80mg  daily  10: Hyponatremia, mild Na 132: follow-up BMP, will fluid restrict - recheck on 5/27- improving cont fluid restriction , renal status stable    6/3 stable , mild hyponatremia , asymptomatic     Latest Ref Rng & Units 07/06/2022    7:03 AM 06/29/2022    5:58 AM 06/25/2022    5:54 AM  BMP  Glucose 70 - 99 mg/dL 161  096  045   BUN 8 - 23 mg/dL 13  16  17    Creatinine 0.61 - 1.24 mg/dL 4.09  8.11  9.14   Sodium 135 - 145 mmol/L 131  132  128   Potassium 3.5 - 5.1 mmol/L 3.8  3.9  4.2   Chloride 98 - 111 mmol/L 100  101  95   CO2 22 - 32 mmol/L 22  24  21    Calcium 8.9 - 10.3 mg/dL 9.0  8.7  8.8     11: History of gout: restart home colchicine   12: DM type II: HGB A1C 6.7, CBGs QID and SSI; carb  modified diet when advanced (home meds include metformin 1000 mg BID) CBG (last 3)  Recent Labs    07/06/22 1650 07/06/22 2110 07/07/22 0559  GLUCAP 133* 176* 139*   6/4 fair control    13: RLE/RUE weakness:normal LE  venous duplex   14: Tobacco use: occasional cigars, advise cessation   15. Constipation             -resolved. Had bm   type 5 6/1  -only on prn senokot-s and sorbitol LOS: 13 days A FACE TO FACE EVALUATION WAS PERFORMED  Erick Colace 07/07/2022, 7:55 AM

## 2022-07-07 NOTE — Progress Notes (Signed)
Physical Therapy Session Note  Patient Details  Name: Jimmy Valentine MRN: [id number] Date of Birth: 11/16/1950  Today's Date: 07/07/2022 PT Individual Time: 4098-1191 PT Individual Time Calculation (min): 61 min   Short Term Goals: Week 1:  PT Short Term Goal 1 (Week 1): Pt will complete bed mobility with modA PT Short Term Goal 1 - Progress (Week 1): Met PT Short Term Goal 2 (Week 1): Pt will complete bed<>chair transfers with maxA of 1 person PT Short Term Goal 2 - Progress (Week 1): Met PT Short Term Goal 3 (Week 1): Pt will maintain unsupported sitting balance for 30 seconds PT Short Term Goal 3 - Progress (Week 1): Met PT Short Term Goal 4 (Week 1): Pt will participate in functional outcome measure to assess falls risk PT Short Term Goal 4 - Progress (Week 1): Met Week 2:  PT Short Term Goal 1 (Week 2): Pt will complete bed mobility with MinA. PT Short Term Goal 2 (Week 2): Pt will complete bed<>chair transfers with ModA. PT Short Term Goal 3 (Week 2): pt will maintain unsupported sitting balance to intermittent CGA. PT Short Term Goal 4 (Week 2): Pt will ambulate at least 75 ft using RW with MinA. PT Short Term Goal 5 (Week 2): pt will initiate stair training.    Skilled Therapeutic Interventions/Progress Updates: Patient supine in bed on entrance to room. Patient alert and agreeable to PT session.   Patient reported no pain or complaints at beginning of session. Patient transported to main gym for time management.   Therapeutic Activity: Bed Mobility: Pt performed supine<>sit on EOB with modA-maxA to advance R LE off of bed, and modA for truncal elevation (required cues to use R UE to push), and minA to get to R sidelying. Patient performed anterior scoot without assistance or VC. TotalA to donn personal shoes and R AFO.  Transfers: Pt performed sit<>stand throughout session with light minA-minA to RW or use of L HHA during gait activities. Patient required mod cues to perform  anterior scoot to increase sit to stand power, and to anteriorly weight shift. Patient maxA to get R LE in stand ready position (PTA approximating R knee). Patient also required VC to use L UE to push off WC instead of pulling on RW.  Gait Training:  Pt ambulated 48' x 1 outside of main gym in hallway using L HHA with and PTA on R side facilitating R LE advancement (WC follow as well). Patient required mod-maxA to maintain balance (totalA to advance R LE), and max cues for patient to laterally weight shift in order to advance contralateral LE though swing phase, and to increase foot clearance. Patient provided with VC to increase step length on L LE, and to maintain centered balance per heavy R lean presentation (patient able to do so static>dynamic). Second trial of gait training was performed in RW in main gym (40') with facilitation of R LE advancement provided on R LE, and CGA-minA provided on the left +2 modA to maintain safe proximity of RW, and totalA to set up R UE in hemi-grip. Patient required rest breaks throughout ambulatory activities.    Patient in [facility] at end of session with brakes locked, belt alarm set, and all needs within reach.      Therapy Documentation Precautions:  Precautions Precautions: Fall Precaution Comments: R hemi; dysarthria Restrictions Weight Bearing Restrictions: No  Therapy/Group: Individual Therapy  Sabirin Baray PTA 07/07/2022, 3:25 PM

## 2022-07-07 NOTE — Progress Notes (Signed)
Occupational Therapy Session Note  Patient Details  Name: Jimmy Valentine MRN: [id number] Date of Birth: 13-Jan-1951  Today's Date: 07/07/2022 OT Individual Time: 1500-1530 OT Individual Time Calculation (min): 30 min    Short Term Goals: Week 2:  OT Short Term Goal 1 (Week 2): Pt will complete toilet transfers with min A or less. OT Short Term Goal 2 (Week 2): Pt will complete squat pivot transfers to tub bench in walk in shower with min A or less. OT Short Term Goal 3 (Week 2): Pt will don shirt with min A or less. OT Short Term Goal 4 (Week 2): Pt will don pants with mod A or less. OT Short Term Goal 5 (Week 2): Pt will be able to hold stand balance with min A while he uses his L hand to adjust pants over hips.  Skilled Therapeutic Interventions/Progress Updates:   Pt seen for skilled OT session with focus on R UE NMRE. Pt reports already using [place] estim earlier day thus OT presented a URIAS Stroke Recovery Air Splint to address deep pressure, neutral warmth, proprioception, weight bearing and proximal stability training. Prior to application, OT reinforced scap retraction and sh elevation exercises to be carried over between visits. Trace R sh noted with R elbow/distal support to un weight. Pt educated in rationale and purpose and OT applied with no issues reported. OT assisted with distal support of R UE while facilitating scap protraction/retraction, sh abd/add with muscle tapping through to elicit response. Pt moved with OT through 3 sets of 10 reps with full UE support and little AAROM. After removal, OT addressed gentle grasp ROM as pt with stiffness and 3/4 PROM R hand. Training for self ROM and gentle massage between visits. Left pt w/c level with urinal next to w/c, brief education on positioning for use, chair alarm active and all needs and nurse call button in reach.   Pain: denies this visit but does report he tries to protect his should d/t pain if it falls off trough.   Therapy  Documentation Precautions:  Precautions Precautions: Fall Precaution Comments: R hemi; dysarthria Restrictions Weight Bearing Restrictions: No    Therapy/Group: Individual Therapy  Vicenta Dunning 07/07/2022, 7:52 AM

## 2022-07-07 NOTE — Progress Notes (Addendum)
Speech Language Pathology Daily Session Note  Patient Details  Name: Jimmy Valentine MRN: [id number] Date of Birth: Sep 14, 1950  Today's Date: 07/07/2022 SLP Individual Time: 2725-3664 SLP Individual Time Calculation (min): 45 min  Short Term Goals: Week 2: SLP Short Term Goal 1 (Week 2): Pt will utilize compensatory word-finding strategies to repair communication breakdowns with >80% accuracy given min cues. SLP Short Term Goal 2 (Week 2): Patient will consume Dys 3 diet and thin liquids  without overt s/s of aspiration with supervision level verbal cues for use of swallowing compensatory strategies. SLP Short Term Goal 3 (Week 2): Pt will consume regular trials with efficient oral prep and transit and use of compensatory swallow strategies to clear oral residue given supervision assistance. SLP Short Term Goal 4 (Week 2): Patient will demonstrate 90% intelligibilty at the sentence level with min A verbal cues for use of speech intelligibility strategies.  Skilled Therapeutic Interventions:  Pt was seen in am to address dysphagia management and speech intelligibility. Pt was alert and seen at bedside. SLP assisted pt in oral hygiene via use of suction toothbrush. He was presented with regular solid breakfast trial tray and thin liquids. SLP provided set up A including cutting meat into 4 pieces per pt request due to ease of feeding. Pt administered all trials. Pt consumed meal with adequate rate with timely mastication, transit and deglution with min to no lingual residue post swallow. He consumed thin liquids via straw and cup edge with no s/sx pen/asp. Post meal, SLP engaged pt in discussion about consumption of regular solids with pt requesting to remain on D3 diet at this time due to ease of feeding. In additional minutes of session, SLP challenged pt in speech intelligibility. SLP reviewed strategies of over articulation and separating words. Pt was guided in structured conversational exchange  with overall ~80-90% speech intelligibility given known context and a familiar listener. Pt demonstrates some independence in self correcting with occasional need for listener to request clarification. Pt sustained bried conversation with unfamiliar NT with no instances where NT requested repetition or clarification. Pt was left at bedside with call button within reach and bed alarm active. SLP to continue POC.   Pain Pain Assessment Pain Scale: 0-10 Pain Score: 6  Pain Location: Leg Pain Orientation: Left Pain Descriptors / Indicators: Sharp Pain Intervention(s): Repositioned  Therapy/Group: Individual Therapy  Renaee Munda 07/07/2022, 8:57 AM

## 2022-07-08 LAB — GLUCOSE, CAPILLARY
Glucose-Capillary: 129 mg/dL — ABNORMAL HIGH (ref 70–99)
Glucose-Capillary: 120 mg/dL — ABNORMAL HIGH (ref 70–99)
Glucose-Capillary: 156 mg/dL — ABNORMAL HIGH (ref 70–99)
Glucose-Capillary: 147 mg/dL — ABNORMAL HIGH (ref 70–99)

## 2022-07-08 NOTE — Progress Notes (Signed)
Patient ID: Jimmy Valentine, male   DOB: [birth year], 72 y.o.   MRN: [id number]  Sw made attempt to call patient's daughter, Jimmy Valentine to provide team conference updates. SW left a detailed VM, sw will wait for FU.

## 2022-07-08 NOTE — Patient Care Conference (Signed)
Inpatient RehabilitationTeam Conference and Plan of Care Update Date: 07/08/2022   Time: 10:23 AM    Patient Name: Jimmy Valentine      Medical Record Number: [id number]  Date of Birth: [birth year] Sex: Male         Room/Bed: 4W20C/4W20C-01 Payor Info: Payor: [employer] MEDICARE / Plan: [employer] MEDICARE HMO/PPO / Product Type: *No Product type* /    Admit Date/Time:  06/24/2022  4:57 PM  Primary Diagnosis:  Acute ischemic left MCA stroke [facility])  Hospital Problems: Principal Problem:   Acute ischemic left MCA stroke [facility])    Expected Discharge Date: Expected Discharge Date: 07/23/22  Team Members Present: Physician leading conference: Dr. Claudette Laws Social Worker Present: Lavera Guise, BSW Nurse Present: Chana Bode, RN PT Present: Casimiro Needle, PT;Dominic Sandoval, PTA OT Present: Primitivo Gauze, OT SLP Present: Feliberto Gottron, SLP PPS Coordinator present : Edson Snowball, PT     Current Status/Progress Goal Weekly Team Focus  Bowel/Bladder   Continent of b/b with episodes of incontinence   Regain full continence   Assist with toileting as needed    Swallow/Nutrition/ Hydration   dys 3 and thin liquid diet sup A   Sup a  trialed regular solids this week with pt able to safely consume diet. Pt request to remain on D3 diet due to ease of feeding    ADL's   CGA wit hUB self care using long sponge and hemi dressing techniques, min A with LB bathing with long sponge, mod A squat pivot transfers, min A sit to stand, mod A static stand without L hand support and min A with L hand support.  He can stand for 20-30 seconds then starts to lean left.  Max - total A with LB dressing.   Min A overall   focus on standing balance and control to decrease burden of care as pt's main caregiver is his brother in law.  focus on transfers to decrease burden of care with toileting.  Continued estim and NMR for RUE as arm is heavy and flaccid and affects his ability to hold stand balance and  transfer easily.  Pt education.    Mobility   Currently: Bed mobility = ModA, squat pivot MinA, sit<>stand = Min/ModA with Mod/MaxA for standing balance. Ambulation = MaxA for ~40-54ft using RW with hand splint. W/c mobility = supervision to MinA dependent on coordination of hemi technique   currently set to MinA  overall with ModA for gait. Will most likely need to downgrade some goals depending on this week's progress  Barriers: RLE hemi with LE flexor tone in standing, L sided weakness/plegia,     Weekly focus: improving transfer LOA prior to family ed, continued R hemibody NMR, overall balance, including static standing, overall strength and tolerance - may need custom w/c    Communication   min to sup A 80- 90% intelligible, able to self correct at times.   supervision   carryover of compensatory strategies and ability to self correct 100% of the time.    Safety/Cognition/ Behavioral Observations               Pain   No c/o pain at this time   Remain pain free   Assess Qshift and prn    Skin   Skin intact   Maintain skin integrity without breakdown  Assess Qshift and prn      Discharge Planning:  Discharging home with BIL, daughter and other family to assist 24/7.   Team  Discussion: Patient with hx of HTN, stable hyponatremia demonstrating hemiplegia and subluxation with flexor tone present with extension post left MCA CVA.  Patient on target to meet rehab goals: yes, currently needs min assist for sit - stand. Completes squat pivot transfers with min assist. Able to ambulate up to 40' using a RW with max assist. Maintained on a dysphagia 3 texture, thin liquid diet. Needs min cues for carry over and self correcting. Goals for discharge set for min assist - mod assist overall.  *See Care Plan and progress notes for long and short-term goals.   Revisions to Treatment Plan:  E-stim without active movement in the upper extremity Working on dynamic standing for clothes  management Regular diet trials   Teaching Needs: Safety, medication, dietary modification, transfers, toileting, etc.   Current Barriers to Discharge: Decreased caregiver support and Home enviroment access/layout  Possible Resolutions to Barriers: Family education HH follow up services DME: Drop arm BSC, TTB and custom wheelchair     Medical Summary Current Status: LE returning to mild degree, no UE return  Barriers to Discharge: Morbid Obesity   Possible Resolutions to [employer]: work on mobility at 1 person assist level   Continued Need for Acute Rehabilitation Level of Care: The patient requires daily medical management by a physician with specialized training in physical medicine and rehabilitation for the following reasons: Direction of a multidisciplinary physical rehabilitation program to maximize functional independence : Yes Medical management of patient stability for increased activity during participation in an intensive rehabilitation regime.: Yes Analysis of laboratory values and/or radiology reports with any subsequent need for medication adjustment and/or medical intervention. : Yes   I attest that I was present, lead the team conference, and concur with the assessment and plan of the team.   Chana Bode B 07/08/2022, 4:17 PM

## 2022-07-08 NOTE — Progress Notes (Signed)
Physical Therapy Session Note  Patient Details  Name: Jimmy Valentine MRN: [id number] Date of Birth: 06-Jan-1951  Today's Date: 07/08/2022 PT Individual Time: 0800-0830 PT Individual Time Calculation (min): 30 min   Short Term Goals: Week 1:  PT Short Term Goal 1 (Week 1): Pt will complete bed mobility with modA PT Short Term Goal 1 - Progress (Week 1): Met PT Short Term Goal 2 (Week 1): Pt will complete bed<>chair transfers with maxA of 1 person PT Short Term Goal 2 - Progress (Week 1): Met PT Short Term Goal 3 (Week 1): Pt will maintain unsupported sitting balance for 30 seconds PT Short Term Goal 3 - Progress (Week 1): Met PT Short Term Goal 4 (Week 1): Pt will participate in functional outcome measure to assess falls risk PT Short Term Goal 4 - Progress (Week 1): Met Week 2:  PT Short Term Goal 1 (Week 2): Pt will complete bed mobility with MinA. PT Short Term Goal 2 (Week 2): Pt will complete bed<>chair transfers with ModA. PT Short Term Goal 3 (Week 2): pt will maintain unsupported sitting balance to intermittent CGA. PT Short Term Goal 4 (Week 2): Pt will ambulate at least 75 ft using RW with MinA. PT Short Term Goal 5 (Week 2): pt will initiate stair training.   Skilled Therapeutic Interventions/Progress Updates: Patient supine in bed on entrance to room. Patient alert and agreeable to PT session.   Patient reported no pain this morning. Patient did report having trouble sleeping last night, and that nursing provided medication to help.   Therapeutic Activity: Bed Mobility: Patient performed supine bridge with PTA approximating R LE, and VS to push heels into bed to assist in hip elevation to donn personal shorts (PTA maxA-totalA donning up to B knees). Pt performed side-roll to the L with maxA and VC to use L UE to assist in pulling trunk over (to finishing donning personal shorts maxA). Patient side-rolled to R with CGA to donn personal shorts supervision.  Transfers: Pt  performed sit to stand from EOB (slightly elevated) with min-modA to RW (with hemi-grip on R), and required VC to perform anterior weight shift, and to use L UE to push from bed. Patient then instructed to laterally weight shift in order to increase R quad activation through WB and proprioceptive feedback on R LE. Patient performed weight shifting in RW with min-modA to maintain standing balance, with mod cues to adjust COG per presentation of heavy R lean.  Patient handed off to OT for treatment in room.       Therapy Documentation Precautions:  Precautions Precautions: Fall Precaution Comments: R hemi; dysarthria Restrictions Weight Bearing Restrictions: Yes  Therapy/Group: Individual Therapy  Zahirah Cheslock PTA 07/08/2022, 10:47 AM

## 2022-07-08 NOTE — Progress Notes (Signed)
Physical Therapy Session Note  Patient Details  Name: Jimmy Valentine MRN: [id number] Date of Birth: 08-21-1950  Today's Date: 07/08/2022 PT Individual Time: 0205-0320 PT Individual Time Calculation (min): 75 min   Short Term Goals: Week 1:  PT Short Term Goal 1 (Week 1): Pt will complete bed mobility with modA PT Short Term Goal 1 - Progress (Week 1): Met PT Short Term Goal 2 (Week 1): Pt will complete bed<>chair transfers with maxA of 1 person PT Short Term Goal 2 - Progress (Week 1): Met PT Short Term Goal 3 (Week 1): Pt will maintain unsupported sitting balance for 30 seconds PT Short Term Goal 3 - Progress (Week 1): Met PT Short Term Goal 4 (Week 1): Pt will participate in functional outcome measure to assess falls risk PT Short Term Goal 4 - Progress (Week 1): Met Week 2:  PT Short Term Goal 1 (Week 2): Pt will complete bed mobility with MinA. PT Short Term Goal 2 (Week 2): Pt will complete bed<>chair transfers with ModA. PT Short Term Goal 3 (Week 2): pt will maintain unsupported sitting balance to intermittent CGA. PT Short Term Goal 4 (Week 2): Pt will ambulate at least 75 ft using RW with MinA. PT Short Term Goal 5 (Week 2): pt will initiate stair training.  Skilled Therapeutic Interventions/Progress Updates:  Patient seated upright in w/c on entrance to room. Patient alert and agreeable to PT session. BIL and pt's dtr leaving following visit. BIL relates that he has texted pictures and measurements of home to pt's phone for reference.   Pt is also able to demo slight R DF activation with no physical assist. Demos with minimal increase in AROM throughout 8 reps.   Patient with no pain complaint at start of session.  Pt brought to therapy gym dependently in w/c for time and energy conservation.  Neuromuscular Re-ed: NMR facilitated during session with focus on standing balance, motor control, increasing muscle activation, motor planning and midline orientation. Pt guided  in: Verbal instruction with visual demonstration for forward lean as hip hinge rather than spinal flexion for improved anterior weight shift in prep for standing. Pt able to perform to upright stance to no AD and light CGA. Pt relates that he can feel more activation in RLE. Very gentle perturbations to hips with pt able to correct back to midline using ankle strategy and minimal hip strategy. Sit<>stand blocked practice with demonstration of improving motor control in descent to sit as well as to rise to stand in midline. Is able to fully control descent with balance over Bil feet and CGA. Minisquats x10 with cues to maintain midline as well as to control the initiation of R knee flexion as pt initially performing uncontrolled "unlock" of knee into flexion. Improving throughout with increased smoothness of movement.  Setup of positioning for blocked practice of squat pivot with focus on RLE positioning, placement of L hand and head/ hips relationship for appropriate unweighting to improve scoot/ squat. Is able to scoot to R/ L along mat table with close supervision. Squat pivot to L with CGA for completing 75% of distance then supervision for full positioning to mat table. Squat pivot to R with MinA to clear seat. One instance of starting transfer without correct RLE positioning and heavy MinA to reach seat.   NMR performed for improvements in motor control and coordination, balance, sequencing, judgement, and self confidence/ efficacy in performing all aspects of mobility at highest level of independence.   Therapeutic Activity:  Transfers/ Bed Mobility: Pt performing squat pivot from w/c to bed toward pt's R side with focus on technique. Requires minA to complete.  Pt performed sit>supine with overall MinA for RLE to bed surface but demos compensatory abdominal bracing for maintaining hip position to improve RLE initiation toward bed. VC/ tc for sequencing technique.   Patient supine in bed at end of  session with brakes locked, bed alarm set, and all needs within reach.   Therapy Documentation Precautions:  Precautions Precautions: Fall Precaution Comments: R hemi; dysarthria Restrictions Weight Bearing Restrictions: Yes General:   Vital Signs:   Pain:  No pain related this session.   Therapy/Group: Individual Therapy  Loel Dubonnet PT, DPT, CSRS 07/08/2022, 7:35 PM

## 2022-07-08 NOTE — Progress Notes (Signed)
PROGRESS NOTE   Subjective/Complaints:  No issues overnite   ROS: Patient denies CP, SOB, N/V/D  Objective:   No results found. Recent Labs    07/06/22 0703  WBC 11.4*  HGB 15.7  HCT 44.2  PLT 314     Recent Labs    07/06/22 0703  NA 131*  K 3.8  CL 100  CO2 22  GLUCOSE 144*  BUN 13  CREATININE 0.80  CALCIUM 9.0      Intake/Output Summary (Last 24 hours) at 07/08/2022 0740 Last data filed at 07/08/2022 0509 Gross per 24 hour  Intake 354 ml  Output 725 ml  Net -371 ml         Physical Exam: Vital Signs Blood pressure 134/82, pulse 66, temperature 97.9 F (36.6 C), resp. rate 18, height 5\' 9"  (1.753 m), weight 97.5 kg, SpO2 96 %.   General: No acute distress Mood and affect are appropriate Heart: Regular rate and rhythm no rubs murmurs or extra sounds Lungs: Clear to auscultation, breathing unlabored, no rales or wheezes Abdomen: Positive bowel sounds, soft nontender to palpation, nondistended Extremities: No clubbing, cyanosis, or edema   Skin: No evidence of breakdown, no evidence of rash Neurologic: word finding deficits, speech more fluent. cranial nerves II through XII intact, motor strength is 5/5 in left and still 0/5 right  deltoid, bicep, tricep, grip. 1/5 hip flexor, 3- knee extensors, 1/5 ankle dorsiflexor and 2- plantar flexor. Extensor tone RLE with PROM still present  Sensory exam normal sensation to light touch  in bilateral upper and lower extremities Cerebellar exam weakness on right precludes testing  Musculoskeletal: Full range of motion in all 4 extremities. No joint swelling,     Assessment/Plan: 1. Functional deficits which require 3+ hours per day of interdisciplinary therapy in a comprehensive inpatient rehab setting. Physiatrist is providing close team supervision and 24 hour management of active medical problems listed below. Physiatrist and rehab team continue to  assess barriers to discharge/monitor patient progress toward functional and medical goals  Care Tool:  Bathing    Body parts bathed by patient: Abdomen, Chest, Right arm, Front perineal area, Right upper leg, Left upper leg, Face   Body parts bathed by helper: Left arm, Buttocks, Right lower leg, Left lower leg     Bathing assist Assist Level: Moderate Assistance - Patient 50 - 74%     Upper Body Dressing/Undressing Upper body dressing   What is the patient wearing?: Pull over shirt    Upper body assist Assist Level: Minimal Assistance - Patient > 75%    Lower Body Dressing/Undressing Lower body dressing      What is the patient wearing?: Incontinence brief, Pants     Lower body assist Assist for lower body dressing: Maximal Assistance - Patient 25 - 49%     Toileting Toileting    Toileting assist Assist for toileting: 2 Helpers     Transfers Chair/bed transfer  Transfers assist  Chair/bed transfer activity did not occur: Safety/medical concerns  Chair/bed transfer assist level: Moderate Assistance - Patient 50 - 74%     Locomotion Ambulation   Ambulation assist   Ambulation activity did not occur: Safety/medical  concerns          Walk 10 feet activity   Assist  Walk 10 feet activity did not occur: Safety/medical concerns        Walk 50 feet activity   Assist Walk 50 feet with 2 turns activity did not occur: Safety/medical concerns         Walk 150 feet activity   Assist Walk 150 feet activity did not occur: Safety/medical concerns         Walk 10 feet on uneven surface  activity   Assist Walk 10 feet on uneven surfaces activity did not occur: Safety/medical concerns         Wheelchair     Assist Is the patient using a wheelchair?: Yes Type of Wheelchair: Manual    Wheelchair assist level: Minimal Assistance - Patient > 75% Max wheelchair distance: 50 ft    Wheelchair 50 feet with 2 turns  activity    Assist        Assist Level: Dependent - Patient 0%   Wheelchair 150 feet activity     Assist      Assist Level: Dependent - Patient 0%   Blood pressure 134/82, pulse 66, temperature 97.9 F (36.6 C), resp. rate 18, height 5\' 9"  (1.753 m), weight 97.5 kg, SpO2 96 %.  Medical Problem List and Plan: 1. 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 Large basal ganglia infarct. 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              -patient may  shower             -ELOS/Goals: 6/20             -Continue WHO and PRAFO             -Continue CIR therapies including PT, OT, and SLP  Team conference today please see physician documentation under team conference tab, met with team  to discuss problems,progress, and goals. Formulized individual treatment plan based on medical history, underlying problem and comorbidities.   2.  Antithrombotics: -DVT/anticoagulation:  Pharmaceutical: Lovenox 4omg daily              -antiplatelet therapy: Aspirin and Plavix for three months followed by aspirin alone (started 5/20)   3. Pain Management: Tylenol, Robaxin as needed   4. Mood/Behavior/Sleep: LCSW to evaluate and provide emotional support             -antipsychotic agents: n/a             -trazodone as needed   5. Neuropsych/cognition: This patient is capable of making decisions on his own behalf.   6. Skin/Wound Care: Routine skin care checks   7. Fluids/Electrolytes/Nutrition: Routine Is and Os and follow-up chemistries             -continue dys3/advanced to thin liquids; SLP following   8: Hypertension: monitor TID and prn (home meds: amlodipine 10 mg QD, valsartan 320 mg daily)             -continue irbesartan 37.5 mg daily Vitals:   07/07/22 1949 07/07/22 1951  BP:  134/82  Pulse:  66  Resp: 18 18  Temp:  97.9 F (36.6  C)  SpO2:  96%   -6/2   irbesartan increased to 75mg , BP now controlled 6/5   9: Hyperlipidemia: continue high intensity statin, change simvastatin  to atorvastatin 80mg  daily   10: Hyponatremia, mild Na 132: follow-up BMP, will fluid restrict - recheck on 5/27- improving cont fluid restriction , renal status stable    6/3 stable , mild hyponatremia , asymptomatic     Latest Ref Rng & Units 07/06/2022    7:03 AM 06/29/2022    5:58 AM 06/25/2022    5:54 AM  BMP  Glucose 70 - 99 mg/dL 784  696  295   BUN 8 - 23 mg/dL 13  16  17    Creatinine 0.61 - 1.24 mg/dL 2.84  1.32  4.40   Sodium 135 - 145 mmol/L 131  132  128   Potassium 3.5 - 5.1 mmol/L 3.8  3.9  4.2   Chloride 98 - 111 mmol/L 100  101  95   CO2 22 - 32 mmol/L 22  24  21    Calcium 8.9 - 10.3 mg/dL 9.0  8.7  8.8     11: History of gout: restart home colchicine   12: DM type II: HGB A1C 6.7, CBGs QID and SSI; carb  modified diet when advanced (home meds include metformin 1000 mg BID) CBG (last 3)  Recent Labs    07/07/22 1656 07/07/22 2050 07/08/22 0610  GLUCAP 149* 141* 129*   6/5 good control    13: RLE/RUE weakness:normal LE  venous duplex   14: Tobacco use: occasional cigars, advise cessation   15. Constipation             -resolved. Had bm   type 5 6/1  -only on prn senokot-s and sorbitol LOS: 14 days A FACE TO FACE EVALUATION WAS PERFORMED  Erick Colace 07/08/2022, 7:40 AM

## 2022-07-08 NOTE — Progress Notes (Signed)
Physical Therapy Session Note  Patient Details  Name: MANARD GAILES MRN: [id number] Date of Birth: 04-27-1950  Today's Date: 07/08/2022 PT Individual Time: 1115-1200 PT Individual Time Calculation (min): 45 min   Short Term Goals: Week 2:  PT Short Term Goal 1 (Week 2): Pt will complete bed mobility with MinA. PT Short Term Goal 2 (Week 2): Pt will complete bed<>chair transfers with ModA. PT Short Term Goal 3 (Week 2): pt will maintain unsupported sitting balance to intermittent CGA. PT Short Term Goal 4 (Week 2): Pt will ambulate at least 75 ft using RW with MinA. PT Short Term Goal 5 (Week 2): pt will initiate stair training.  Skilled Therapeutic Interventions/Progress Updates:  Patient greeted supine in bed and agreeable to PT treatment session. While supine, therapist donned tennis shoes for time management. Patient then transitioned from supine to sitting EOB on the R side with use of bed rail and MinA for righting trunk. Patient then performed squat pivot transfer from EOB to wheelchair with Min/ModA with good effort noted throughout. Patient wheeled to the hallway outside of the main gym for time management and energy conservation.   Patient performed various sit/stands with L HR and MinA overall with therapist providing facilitation/TC at R knee for increased WB and activation.   Patient gait trained 3 x 30' with L HR and Min/ModA from therapist- Rehab tech providing wheelchair follow for safety while therapist was on a stool managing R LE throughout gait trial. Patient was able to initiate R swing phase of gait with therapist providing MinA to complete swing phase with minor TC at R knee for stability during stance phase, however patient was able to activate his Rt quads during stance phase. VC for increased L lateral lean for improved Rt swing phase and increased Lt step length with good improvements overall and great effort throughout. Mirror placed in front of patient for external  visual cues regarding posturing. Rt LE wrapped with ACE wrap in order to facilitate improved Rt ankle DF for swing phase, along with shoe cover and blue strap used to support Rt UE.   Patient stood with L HR while attempting to kick a ball with his Rt LE back to rehab tech- Patient performed x20 repetitions with inconsistent activation noted, however good overall effort and able to advance Rt LE 4/20 attempts and noted activation during the other reps.   Patient returned to his room and left sitting upright in wheelchair with call bell within reach, posey belt on and all needs met.    Therapy Documentation Precautions:  Precautions Precautions: Fall Precaution Comments: R hemi; dysarthria Restrictions Weight Bearing Restrictions: No  Pain: No/Denies pain.    Therapy/Group: Individual Therapy  Kameryn Davern 07/08/2022, 8:52 AM

## 2022-07-08 NOTE — Progress Notes (Signed)
Occupational Therapy Session Note  Patient Details  Name: Jimmy Valentine MRN: [id number] Date of Birth: 28-Dec-1950  Today's Date: 07/08/2022 OT Individual Time: 0830-0900 OT Individual Time Calculation (min): 30 min    Short Term Goals: Week 2:  OT Short Term Goal 1 (Week 2): Pt will complete toilet transfers with min A or less. OT Short Term Goal 2 (Week 2): Pt will complete squat pivot transfers to tub bench in walk in shower with min A or less. OT Short Term Goal 3 (Week 2): Pt will don shirt with min A or less. OT Short Term Goal 4 (Week 2): Pt will don pants with mod A or less. OT Short Term Goal 5 (Week 2): Pt will be able to hold stand balance with min A while he uses his L hand to adjust pants over hips.  Skilled Therapeutic Interventions/Progress Updates:    Pt received sitting on EOB working with the PTA.  PTA available as a +2 assist during the OT session.   OT session focused on controlled sit >< stands from EOB >< RW, controlled standing balance with weight bearing through R hand on RW, and Weight shifts laterally to increased his balance to be able to engage L hand for LB dressing tasks.   Pt can wt shift to R with slight knee bend with support to R knee, but has great difficulty with L weight shift as he keeps his leg extended and has diffculty with knee bend.  To increase his comfort with the movement pattern, pt worked on [employer] for 2 sets.  This improved his knee bend slightly but he needs continued practice.   Pt opted to lay down to rest until next session.  Worked on bed mobility skills of scooting  hips toward HOB with lifting hips high, moving with control using L arm to sidelying and rolling using L arm to support R arm while engaging his core to roll.   Pt resting in bed with all needs met, arm supported by pillow, and call light in reach.   Therapy Documentation Precautions:  Precautions Precautions: Fall Precaution Comments: R hemi;  dysarthria Restrictions Weight Bearing Restrictions: No  Vital Signs: Therapy Vitals Temp: 97.9 F (36.6 C) Pulse Rate: 82 Resp: 18 BP: 136/73 Patient Position (if appropriate): Lying Oxygen Therapy SpO2: 95 % O2 Device: Room Air Pain: Pain Assessment Pain Scale: 0-10 Pain Score: 0-No pain ADL: ADL Grooming: Setup Upper Body Bathing: Supervision/safety (using long sponge to reach L arm) Where Assessed-Upper Body Bathing: Shower Lower Body Bathing: Minimal assistance (using long sponge to reach feet) Where Assessed-Lower Body Bathing: Shower Upper Body Dressing: Minimal assistance, Minimal cueing Where Assessed-Upper Body Dressing: Wheelchair Lower Body Dressing: Maximal assistance Where Assessed-Lower Body Dressing: Wheelchair Toileting: Dependent Toilet Transfer: Dependent Statistician Method: Other (comment) (STEDY +2) Walk-In Shower Transfer: Minimal assistance, Moderate assistance Film/video editor Method: Administrator: Emergency planning/management officer, Grab bars ADL Comments: .    Therapy/Group: Individual Therapy  Claron Rosencrans 07/08/2022, 8:29 AM

## 2022-07-09 LAB — GLUCOSE, CAPILLARY
Glucose-Capillary: 140 mg/dL — ABNORMAL HIGH (ref 70–99)
Glucose-Capillary: 147 mg/dL — ABNORMAL HIGH (ref 70–99)
Glucose-Capillary: 131 mg/dL — ABNORMAL HIGH (ref 70–99)
Glucose-Capillary: 128 mg/dL — ABNORMAL HIGH (ref 70–99)

## 2022-07-09 MED ORDER — SORBITOL 70 % SOLN
60.0000 mL | Freq: Every day | Status: DC | PRN
  Administered 2022-07-09: 60 mL via ORAL
  Filled 2022-07-09: qty 60

## 2022-07-09 NOTE — Progress Notes (Signed)
Physical Therapy Session Note  Patient Details  Name: Jimmy Valentine MRN: [id number] Date of Birth: 1950/08/21  Today's Date: 07/09/2022 PT Individual Time: 1303-1400 PT Individual Time Calculation (min): 57 min   Short Term Goals: Week 1:  PT Short Term Goal 1 (Week 1): Pt will complete bed mobility with modA PT Short Term Goal 1 - Progress (Week 1): Met PT Short Term Goal 2 (Week 1): Pt will complete bed<>chair transfers with maxA of 1 person PT Short Term Goal 2 - Progress (Week 1): Met PT Short Term Goal 3 (Week 1): Pt will maintain unsupported sitting balance for 30 seconds PT Short Term Goal 3 - Progress (Week 1): Met PT Short Term Goal 4 (Week 1): Pt will participate in functional outcome measure to assess falls risk PT Short Term Goal 4 - Progress (Week 1): Met Week 2:  PT Short Term Goal 1 (Week 2): Pt will complete bed mobility with MinA. PT Short Term Goal 2 (Week 2): Pt will complete bed<>chair transfers with ModA. PT Short Term Goal 3 (Week 2): pt will maintain unsupported sitting balance to intermittent CGA. PT Short Term Goal 4 (Week 2): Pt will ambulate at least 75 ft using RW with MinA. PT Short Term Goal 5 (Week 2): pt will initiate stair training. Week 3:     Skilled Therapeutic Interventions/Progress Updates: Patient in [facility] on entrance to room. Patient alert and agreeable to PT session.   Patient reported no pain. Patient stated needing to have a BM. Patient dependently transported in [facility] to bathroom toilet. Patient reported receiving medication to assist in having BM.   Therapeutic Activity: Transfers: Pt performed sit<>stand pivot transfer from [facility] to toilet with use of bathroom handrail in front of WC (with toilet to the L) and required minA with retro stepping R LE (noted to have some movement in retro step to step back to toilet). Provided VC for L lean to create room for R LE to retro step. Patient required totalA for posterior pericare and to donn/doff  brief/personal shorts. Patient demonstrated ability to statically stand while PTA provided self-care with use of L UE on rail. Patient did have a BM (documented in flowsheets). Patient squat pivoted WC<>hi/low mat with minA (min-mod cues required for set up and sequence - primarily head/hip relationship).   Neuromuscular Re-ed: NMR facilitated during session with focus on R quad/hip musculature activation. - Patient in RW with PTA on R, rehab tech on L, and instructions for patient to lean to L and focus on advancing R LE to kick cone in front on the floor (PTA also held a theraband behind patient's R LE to increase difficulty and for external cue. Patient demonstrated quad activation per presentation of extending through theraband for the first trial, and then required rest break per report of L LE fatiguing. Patient performed second trial with less activation (possibly due to fatigue as this was an afternoon session and had therapy throughout the morning).   Will attempt same intervention in lite gait next session.   NMR performed for improvements in motor control and coordination, balance, sequencing, judgement, and self confidence/ efficacy in performing all aspects of mobility at highest level of independence.   Patient in [facility] at end of session with NT present assisting patient to bathroom for BM.       Therapy Documentation Precautions:  Precautions Precautions: Fall Precaution Comments: R hemi; dysarthria Restrictions Weight Bearing Restrictions: Yes  Therapy/Group: Individual Therapy  Lacresia Darwish PTA 07/09/2022, 3:15  PM

## 2022-07-09 NOTE — Progress Notes (Addendum)
Occupational Therapy Session Note  Patient Details  Name: Jimmy Valentine MRN: [id number] Date of Birth: February 12, 1950  Today's Date: 07/09/2022 OT Individual Time: 4098-1191 OT Individual Time Calculation (min): 65 min (unattended estim 945 - 1015)    Short Term Goals: Week 2:  OT Short Term Goal 1 (Week 2): Pt will complete toilet transfers with min A or less. OT Short Term Goal 2 (Week 2): Pt will complete squat pivot transfers to tub bench in walk in shower with min A or less. OT Short Term Goal 3 (Week 2): Pt will don shirt with min A or less. OT Short Term Goal 4 (Week 2): Pt will don pants with mod A or less. OT Short Term Goal 5 (Week 2): Pt will be able to hold stand balance with min A while he uses his L hand to adjust pants over hips.  Skilled Therapeutic Interventions/Progress Updates:    Pt received in bed ready for therapy.  Focus of therapy session on pt's transfers and ability to stand with ADL training.  Pt requesting a shower.  Rehab tech present for support with supply management during first part of session.   Pt worked on rolling on flat bed and no rails to L side with min A and then sat to EOB with min A pushing up with L arm. Bed to wc to L side squat pivot min -mod A.   Then transferred to tub bench with bar with min A. See ADL documentation below.  Focus today was pt rising to stand and holding static stand with L hand on grab bar for therapist to assist with cleansing his bottom and clothing management. Pt able to hold stand with close S.  Improved hemi dressing strategy today to don shirt with set up and cues.   Once self care completed, pt engaged in NMR to R arm with estim. Neuromuscular Re-Education:  -applied estim to deltoids and wrist and finger extensors using small muscle atrophy setting on intensity 28 on shoulder and 26 on forearm.  Observed pt for 5 minutes to ensure intensity setting tolerable. Pt sat in w/c with unattended estim for 30 more minutes.  Pt  instructed on how to turn off machine if needed. OT returned to room to remove estim and pt tolerated 30 min well.   Pt resting in w/c with all needs met. Alarm set and call light in reach.         Therapy Documentation Precautions:  Precautions Precautions: Fall Precaution Comments: R hemi; dysarthria Restrictions Weight Bearing Restrictions: Yes Pain:  No c/o pain  ADL: ADL Grooming: Setup Upper Body Bathing: Supervision/safety (using long sponge to reach L arm) Where Assessed-Upper Body Bathing: Shower Lower Body Bathing: Minimal assistance (using long sponge to reach feet) Where Assessed-Lower Body Bathing: Shower Upper Body Dressing: Minimal cueing, Supervision/safety Where Assessed-Upper Body Dressing: Wheelchair Lower Body Dressing: Maximal assistance Where Assessed-Lower Body Dressing: Wheelchair Toileting: Dependent Toilet Transfer: Moderate assistance Toilet Transfer Method: Squat pivot Toilet Transfer Equipment: Drop arm bedside commode Film/video editor: Minimal assistance Film/video editor Method: Administrator: Emergency planning/management officer, Grab bars ADL Comments: .   Therapy/Group: Individual Therapy  Kerrie Latour 07/09/2022, 12:33 PM

## 2022-07-09 NOTE — Progress Notes (Signed)
PROGRESS NOTE   Subjective/Complaints:  No issues overnite , discussed rec for handrails on both sides of stairway   ROS: Patient denies CP, SOB, N/V/D  Objective:   No results found. No results for input(s): "WBC", "HGB", "HCT", "PLT" in the last 72 hours.   No results for input(s): "NA", "K", "CL", "CO2", "GLUCOSE", "BUN", "CREATININE", "CALCIUM" in the last 72 hours.    Intake/Output Summary (Last 24 hours) at 07/09/2022 0747 Last data filed at 07/09/2022 0315 Gross per 24 hour  Intake 720 ml  Output 600 ml  Net 120 ml         Physical Exam: Vital Signs Blood pressure 134/66, pulse 64, temperature 98.1 F (36.7 C), resp. rate 16, height 5\' 9"  (1.753 m), weight 97.5 kg, SpO2 93 %.   General: No acute distress Mood and affect are appropriate Heart: Regular rate and rhythm no rubs murmurs or extra sounds Lungs: Clear to auscultation, breathing unlabored, no rales or wheezes Abdomen: Positive bowel sounds, soft nontender to palpation, nondistended Extremities: No clubbing, cyanosis, or edema   Skin: No evidence of breakdown, no evidence of rash Neurologic: word finding deficits, speech more fluent. cranial nerves II through XII intact, motor strength is 5/5 in left and still 0/5 right  deltoid, bicep, tricep, grip. 1/5 hip flexor, 3- knee extensors, 1/5 ankle dorsiflexor and 2- plantar flexor. Extensor tone RLE with PROM still present  Sensory exam normal sensation to light touch  in bilateral upper and lower extremities Cerebellar exam weakness on right precludes testing  Musculoskeletal: Full range of motion in all 4 extremities. No joint swelling,     Assessment/Plan: 1. Functional deficits which require 3+ hours per day of interdisciplinary therapy in a comprehensive inpatient rehab setting. Physiatrist is providing close team supervision and 24 hour management of active medical problems listed  below. Physiatrist and rehab team continue to assess barriers to discharge/monitor patient progress toward functional and medical goals  Care Tool:  Bathing    Body parts bathed by patient: Abdomen, Chest, Right arm, Front perineal area, Right upper leg, Left upper leg, Face   Body parts bathed by helper: Left arm, Buttocks, Right lower leg, Left lower leg     Bathing assist Assist Level: Moderate Assistance - Patient 50 - 74%     Upper Body Dressing/Undressing Upper body dressing   What is the patient wearing?: Pull over shirt    Upper body assist Assist Level: Minimal Assistance - Patient > 75%    Lower Body Dressing/Undressing Lower body dressing      What is the patient wearing?: Incontinence brief, Pants     Lower body assist Assist for lower body dressing: Maximal Assistance - Patient 25 - 49%     Toileting Toileting    Toileting assist Assist for toileting: 2 Helpers     Transfers Chair/bed transfer  Transfers assist  Chair/bed transfer activity did not occur: Safety/medical concerns  Chair/bed transfer assist level: Moderate Assistance - Patient 50 - 74%     Locomotion Ambulation   Ambulation assist   Ambulation activity did not occur: Safety/medical concerns          Walk 10 feet activity  Assist  Walk 10 feet activity did not occur: Safety/medical concerns        Walk 50 feet activity   Assist Walk 50 feet with 2 turns activity did not occur: Safety/medical concerns         Walk 150 feet activity   Assist Walk 150 feet activity did not occur: Safety/medical concerns         Walk 10 feet on uneven surface  activity   Assist Walk 10 feet on uneven surfaces activity did not occur: Safety/medical concerns         Wheelchair     Assist Is the patient using a wheelchair?: Yes Type of Wheelchair: Manual    Wheelchair assist level: Minimal Assistance - Patient > 75% Max wheelchair distance: 50 ft     Wheelchair 50 feet with 2 turns activity    Assist        Assist Level: Dependent - Patient 0%   Wheelchair 150 feet activity     Assist      Assist Level: Dependent - Patient 0%   Blood pressure 134/66, pulse 64, temperature 98.1 F (36.7 C), resp. rate 16, height 5\' 9"  (1.753 m), weight 97.5 kg, SpO2 93 %.  Medical Problem List and Plan: 1. 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 Large basal ganglia infarct. 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              -patient may  shower             -ELOS/Goals: 6/20             -Continue WHO and PRAFO             -Continue CIR therapies including PT, OT, and SLP     2.  Antithrombotics: -DVT/anticoagulation:  Pharmaceutical: Lovenox 4omg daily              -antiplatelet therapy: Aspirin and Plavix for three months followed by aspirin alone (started 5/20)   3. Pain Management: Tylenol, Robaxin as needed   4. Mood/Behavior/Sleep: LCSW to evaluate and provide emotional support             -antipsychotic agents: n/a             -trazodone as needed   5. Neuropsych/cognition: This patient is capable of making decisions on his own behalf.   6. Skin/Wound Care: Routine skin care checks   7. Fluids/Electrolytes/Nutrition: Routine Is and Os and follow-up chemistries             -continue dys3/advanced to thin liquids; SLP following   8: Hypertension: monitor TID and prn (home meds: amlodipine 10 mg QD, valsartan 320 mg daily)             -continue irbesartan 37.5 mg daily Vitals:   07/08/22 1944 07/09/22 0419  BP: 117/67 134/66  Pulse: 72 64  Resp: 18 16  Temp: 98.5 F (36.9 C) 98.1 F (36.7 C)  SpO2: 98% 93%   -6/2   irbesartan increased to 75mg , BP now controlled 6/6   9: Hyperlipidemia: continue high intensity statin, change simvastatin to atorvastatin 80mg   daily   10: Hyponatremia, mild Na 132: follow-up BMP, will fluid restrict - recheck on 5/27- improving cont fluid restriction , renal status stable    6/3 stable , mild hyponatremia , asymptomatic     Latest Ref  Rng & Units 07/06/2022    7:03 AM 06/29/2022    5:58 AM 06/25/2022    5:54 AM  BMP  Glucose 70 - 99 mg/dL 409  811  914   BUN 8 - 23 mg/dL 13  16  17    Creatinine 0.61 - 1.24 mg/dL 7.82  9.56  2.13   Sodium 135 - 145 mmol/L 131  132  128   Potassium 3.5 - 5.1 mmol/L 3.8  3.9  4.2   Chloride 98 - 111 mmol/L 100  101  95   CO2 22 - 32 mmol/L 22  24  21    Calcium 8.9 - 10.3 mg/dL 9.0  8.7  8.8     11: History of gout: restart home colchicine   12: DM type II: HGB A1C 6.7, CBGs QID and SSI; carb  modified diet when advanced (home meds include metformin 1000 mg BID) CBG (last 3)  Recent Labs    07/08/22 1623 07/08/22 2102 07/09/22 0606  GLUCAP 156* 147* 140*   6/6 good control    13: RLE/RUE weakness:normal LE  venous duplex   14: Tobacco use: occasional cigars, advise cessation   15. Constipation             -resolved. Had bm   type 5 6/1  -only on prn senokot-s and sorbitol LOS: 15 days A FACE TO FACE EVALUATION WAS PERFORMED  Erick Colace 07/09/2022, 7:47 AM

## 2022-07-09 NOTE — Progress Notes (Signed)
Physical Therapy Session Note  Patient Details  Name: Jimmy Valentine MRN: [id number] Date of Birth: 1950-08-04  Today's Date: 07/09/2022 PT Individual Time: 4098-1191 PT Individual Time Calculation (min): 54 min   Short Term Goals: Week 2:  PT Short Term Goal 1 (Week 2): Pt will complete bed mobility with MinA. PT Short Term Goal 2 (Week 2): Pt will complete bed<>chair transfers with ModA. PT Short Term Goal 3 (Week 2): pt will maintain unsupported sitting balance to intermittent CGA. PT Short Term Goal 4 (Week 2): Pt will ambulate at least 75 ft using RW with MinA. PT Short Term Goal 5 (Week 2): pt will initiate stair training.  Skilled Therapeutic Interventions/Progress Updates:    Pt received supine in bed reporting some fatigue but agreeable to therapy session. Supine>sitting R EOB, HOB flat but using bedrail, with heavy mod assist to bring trunk upright and pt using L LE to manage R LE off EOB. Sitting EOB with supervision for trunk control due to minor posterior LOB using L UE support to maintain upright while therapist donned shoes total assist. L squat pivot EOB>w/c with light min assist for pivoting hips.  Transported to/from gym in w/c for time management and energy conservation. Swapped [place] Walk-on PLS AFO for another one with a better strap.  Sit>stands w/c>RW with light min assist due to R posterior lean due to lack of sufficient R LE hip/knee extension while rising to stand - therapist assist with R hand management on/off RW orthosis.  Gait training 45ft + 77ft using RW with skilled min assist for balance and R LE management with +2 w/c follow progressed to no +2 assist. Pt demonstrating the following gait deviations with therapist providing the described cuing and facilitation for improvement:  - donned shoe cover on R LE to create a toe cap  - pt with good R LE knee control during stance; however, with fatigue will have slight flexion but no buckling  - able to initiate R  LE swing phase advancement, but still requires heavy max assist to advance fully to achieve reciprocal step length, possible hamstring tone or impaired motor planning/muscle activation with pt activating hamstrings to prevent reaching knee extension at initial contact  - cuing for L weight shift onto L stance phase to improve R swing advancement  - pt with sufficient balance overall with only x2 instances of minor LOB and able to recover with cuing for sequencing balance recovery  Initiated stair navigation training ascending/descending 4 steps (6" height) using L UE support on each HR using step-to pattern with heavy mod/max assist of 1 and +2 guarding for safety: - ascent requires total assist to bring R LE up onto next step with R LE adducting back behind him with knee extended positioning; therapist blocking R knee for safety while pt stepping up with L LE onto next step but no true buckling, but would benefit from manual facilitation to encourage sufficient quad activation - descent requires total assist to bring R LE down onto next step with pt having R LE draw up into flexion when he is bending L LE to place R foot down making it challenging for therapist to position his foot in correct position  Educated pt that he will need consistent stair navigation training if he is going to reach a point that he can safely and successfully navigate his stairs for home entry with family assistance, but otherwise will need a ramped entrance. Pt's son arrived at end of therapy session  and therapist educated him on this fact as well. Therapist also recommended he come in to initiate family education/training at least ~1 week prior to D/C to allow him to see pt's LOF at that point before hands-on training is performed closer to D/C.   At end of session, pt left seated in w/c with needs in reach, seat belt alarm on, and meal tray set-up.    Therapy Documentation Precautions:  Precautions Precautions:  Fall Precaution Comments: R hemi; dysarthria Restrictions Weight Bearing Restrictions: Yes   Pain:  No reports of pain throughout session.    Therapy/Group: Individual Therapy  Ginny Forth , PT, DPT, NCS, CSRS 07/09/2022, 4:36 PM

## 2022-07-09 NOTE — Progress Notes (Signed)
Physical Therapy Session Note  Patient Details  Name: Jimmy Valentine MRN: [id number] Date of Birth: 07-01-1950  Today's Date: 07/09/2022 PT Individual Time: 1100-1205 PT Individual Time Calculation (min): 65 min   Short Term Goals: Week 2:  PT Short Term Goal 1 (Week 2): Pt will complete bed mobility with MinA. PT Short Term Goal 2 (Week 2): Pt will complete bed<>chair transfers with ModA. PT Short Term Goal 3 (Week 2): pt will maintain unsupported sitting balance to intermittent CGA. PT Short Term Goal 4 (Week 2): Pt will ambulate at least 75 ft using RW with MinA. PT Short Term Goal 5 (Week 2): pt will initiate stair training.  Skilled Therapeutic Interventions/Progress Updates: Pt presented in w/c having just completed toileting and agreeable to therapy. Pt denies pain during session but expressing may have urgency to use bathroom as received additional meds for constipation today. Pt transported to main gym hallway to time management and PTA donned PLS AFO total A and added toe cap. Pt stood with minA and initiated weight shfiting L/R with PTA noting significant improvement of quad activation as compared to previous time with this therapist. Pt then participated in ambulation at wall rail with w/c follow. PTA providing cues for weight shifting to L when attempting to advance RLE. PTA noted hip flexion activation but unable to generate enough power to complete swing phase therefore PTA advanced LE. PTA providing cues for quad activation when in stance phase prior to advancing LLE as pt would occasionally step with LLE with R knee in flexed position. After ambulation pt indicated need for bathroom therefore transported back to room and pt performed stand pivot transfer with use of wall rail to toilet. Pt noted to be able to use glues/hamstrings to bring RLE backwards towards toilet. PTA provided total A for LB clothing management and minA for stand to sit to toilet (continent BM). Pt then stood with  minA and wall rail and PTA provided peri-care total A. Pt then required modA to transfer back to w/c. Pt transported back to main gym and performed squat pivot transfer to mat with minA. Pt then performed Sit to stand with RW with minA and participated in ball taps using LUE with R hand in walker splint. Pt noted to have improved awareness of RLE as would intermittently flex but pt able to engage and return to extension without cues. Pt then attempted to work on ambulation with RW with pt able to stand with minA but continues to require modA due to assistance required to advance RLE with facilitation for weight shifting with pt ambulating approx 63ft. Pt was able to take x 1 step which he was able to advance a small amount without assist. Pt returned to w/c and transported back to room. In room pt indicated another bout of urgency. Performed stand pivot transfer in same manner as prior with use of wall rail and PTA providing maxA for LB clothing management. Pt left seated at toilet with Stedy set up and pull cord within reach with pt verbalizing understanding to use pull cord once completed and NT notified of pt's disposition.      Therapy Documentation Precautions:  Precautions Precautions: Fall Precaution Comments: R hemi; dysarthria Restrictions Weight Bearing Restrictions: Yes    Therapy/Group: Individual Therapy  Jimmy Valentine 07/09/2022, 12:33 PM

## 2022-07-09 NOTE — Progress Notes (Signed)
Patient ID: Jimmy Valentine, male   DOB: 1950-08-23, 72 y.o.   MRN: [id number]  Team Conference Report to Patient/Family  Team Conference discussion was reviewed with the patient and caregiver, including goals, any changes in plan of care and target discharge date.  Patient and caregiver express understanding and are in agreement.  The patient has a target discharge date of 07/23/22.  Sw spoke with daughter to discuss team conference updates. Daughter concerned about patient requiring assistance with LB bathing, dressing, tranfers bath and shower and bed mobility. Daughter expressed her, her brother and pt's brother will be rotating 24/7 supervision.   Daughter plans to speak with her brother and uncle to discuss arranging family education this weekend of first thing next week to get a better idea of care needs. Daughter will aldo discuss installing an additional railing on the steps with her uncle. No additional questions or concerns. Daughter will call back to arrange preferred days for family edu.   Andria Rhein 07/09/2022, 10:55 AM

## 2022-07-10 LAB — GLUCOSE, CAPILLARY
Glucose-Capillary: 75 mg/dL (ref 70–99)
Glucose-Capillary: 145 mg/dL — ABNORMAL HIGH (ref 70–99)
Glucose-Capillary: 118 mg/dL — ABNORMAL HIGH (ref 70–99)
Glucose-Capillary: 163 mg/dL — ABNORMAL HIGH (ref 70–99)

## 2022-07-10 MED ORDER — INSULIN ASPART 100 UNIT/ML IJ SOLN
0.0000 [IU] | Freq: Three times a day (TID) | INTRAMUSCULAR | Status: DC
  Administered 2022-07-10 – 2022-07-11 (×2): 1 [IU] via SUBCUTANEOUS
  Administered 2022-07-11: 2 [IU] via SUBCUTANEOUS
  Administered 2022-07-11 – 2022-07-20 (×18): 1 [IU] via SUBCUTANEOUS
  Administered 2022-07-20: 2 [IU] via SUBCUTANEOUS
  Administered 2022-07-21 – 2022-07-25 (×12): 1 [IU] via SUBCUTANEOUS
  Administered 2022-07-26 – 2022-07-28 (×4): 2 [IU] via SUBCUTANEOUS
  Administered 2022-07-28 (×2): 1 [IU] via SUBCUTANEOUS
  Administered 2022-07-29: 2 [IU] via SUBCUTANEOUS

## 2022-07-10 NOTE — Progress Notes (Signed)
Occupational Therapy Session Note  Patient Details  Name: Jimmy Valentine MRN: [id number] Date of Birth: 1950-11-08  Today's Date: 07/10/2022 OT Individual Time: 7829-5621 OT Individual Time Calculation (min): 65 min (60 min unattended estim)   Short Term Goals: Week 1:  OT Short Term Goal 1 (Week 1): Pt will be able to sit EOB with close S and don shirt with mod A or less. OT Short Term Goal 1 - Progress (Week 1): Met OT Short Term Goal 2 (Week 1): Pt will be able to sit to stand from EOB with max A of 1 to  prep for LB dressing. OT Short Term Goal 2 - Progress (Week 1): Met OT Short Term Goal 3 (Week 1): Pt will complete squat pivot to toilet with max A of 1. OT Short Term Goal 3 - Progress (Week 1): Met OT Short Term Goal 4 (Week 1): Pt will be able to perform gentle self ROM on RUE with min A. OT Short Term Goal 4 - Progress (Week 1): Met Week 2:  OT Short Term Goal 1 (Week 2): Pt will complete toilet transfers with min A or less. OT Short Term Goal 1 - Progress (Week 2): Met OT Short Term Goal 2 (Week 2): Pt will complete squat pivot transfers to tub bench in walk in shower with min A or less. OT Short Term Goal 2 - Progress (Week 2): Progressing toward goal OT Short Term Goal 3 (Week 2): Pt will don shirt with min A or less. OT Short Term Goal 3 - Progress (Week 2): Met OT Short Term Goal 4 (Week 2): Pt will don pants with mod A or less. OT Short Term Goal 4 - Progress (Week 2): Met OT Short Term Goal 5 (Week 2): Pt will be able to hold stand balance with min A while he uses his L hand to adjust pants over hips. OT Short Term Goal 5 - Progress (Week 2): Met Week 3:  OT Short Term Goal 1 (Week 3): STGs = LTGs  Skilled Therapeutic Interventions/Progress Updates:    Pt received in w/c ready for therapy.  Focus of therapy session on ADL transfers.  With arm sling on, Pt practiced completing w/c to toilet transfers several times with L hand on grab with only min A.  He did an excellent  job rising to stand and pivoting actively using his R leg.  Practiced holding standing balance with min A as he adjusted pants over hips.  Tried to stand pivot into shower (dry run practice) 2x but more challenging due to set up of wall space and grab bar placement.  For now he will still need to do squat pivot transfers.   In room worked on standing with RW for weight bearing through R hand with facilitation through elbow.  Pt worked on releasing L hand to practice reaching towards knees, back of hips, forward to increase dynamic balance needed for self care tasks. He showed great improvement today.    Continues to have fully flaccid RUE but did observe trace scapular elevation.   Applied estim to deltoids and wrist and finger extensors for 60 min of unattended estim at intensity 28.  Returned to room 60 min later to remove estim. Pt tolerated it well.   Pt resting in w/c with all needs met. Alarm set and call light in reach.    Therapy Documentation Precautions:  Precautions Precautions: Fall Precaution Comments: R hemi; dysarthria Restrictions Weight Bearing Restrictions: Yes  Pain: no c/o pain    ADL: ADL Grooming: Setup Upper Body Bathing: Supervision/safety (using long sponge to reach L arm) Where Assessed-Upper Body Bathing: Shower Lower Body Bathing: Minimal assistance (using long sponge to reach feet) Where Assessed-Lower Body Bathing: Shower Upper Body Dressing: Minimal cueing, Supervision/safety Where Assessed-Upper Body Dressing: Wheelchair Lower Body Dressing: Maximal assistance Where Assessed-Lower Body Dressing: Wheelchair Toileting: Dependent Toilet Transfer: Moderate assistance Toilet Transfer Method: Squat pivot Toilet Transfer Equipment: Drop arm bedside commode Film/video editor: Minimal assistance Film/video editor Method: Administrator: Emergency planning/management officer, Grab bars ADL Comments: .  Therapy/Group: Individual  Therapy  Jerad Dunlap 07/10/2022, 10:56 AM

## 2022-07-10 NOTE — Progress Notes (Signed)
Physical Therapy Session Note  Patient Details  Name: Jimmy Valentine MRN: [id number] Date of Birth: 1950-10-28  Today's Date: 07/10/2022 PT Individual Time: 4098-1191 PT Individual Time Calculation (min): 73 min   Short Term Goals: Week 1:  PT Short Term Goal 1 (Week 1): Pt will complete bed mobility with modA PT Short Term Goal 1 - Progress (Week 1): Met PT Short Term Goal 2 (Week 1): Pt will complete bed<>chair transfers with maxA of 1 person PT Short Term Goal 2 - Progress (Week 1): Met PT Short Term Goal 3 (Week 1): Pt will maintain unsupported sitting balance for 30 seconds PT Short Term Goal 3 - Progress (Week 1): Met PT Short Term Goal 4 (Week 1): Pt will participate in functional outcome measure to assess falls risk PT Short Term Goal 4 - Progress (Week 1): Met Week 2:  PT Short Term Goal 1 (Week 2): Pt will complete bed mobility with MinA. PT Short Term Goal 2 (Week 2): Pt will complete bed<>chair transfers with ModA. PT Short Term Goal 3 (Week 2): pt will maintain unsupported sitting balance to intermittent CGA. PT Short Term Goal 4 (Week 2): Pt will ambulate at least 75 ft using RW with MinA. PT Short Term Goal 5 (Week 2): pt will initiate stair training. Week 3:     Skilled Therapeutic Interventions/Progress Updates: Patient in [facility] on entrance to room with rehab tech and friend present (left shortly after). Patient alert and agreeable to PT session.   Patient reported no complaints of pain. This session focused primarily on R LE advancement in lite gait.  Therapeutic Activity: Bed Mobility: Pt performed sit to supine at end of session from EOB with minA to control trunk descent, and modA to elevate R LE onto bed. VC provided to activate R LE musculature to assistance in elevation.  Transfers: Pt performed sit<>stand transfers throughout session with minA-light minA either to RW or lite gait handle. Provided VC for LE readiness to stand (PTA adjusted maxA of RLE). Patient  performed squat pivot with min-modA (modA to R) with cues to ensure B LE readiness, and head/hip relationship.   Neuromuscular Re-ed: NMR facilitated during session with focus on R LE musculature activation during swing phase of the gait cycle in lite gate. - Patient in lite gait with PTA on R LE assisting in swing phase and rehab tech advancing lite gait. Patient presented with ability to advance R LE to mid swing, but required modA to advance through mid - terminal 2/2 decreased step clearance (shoe cap donned). Patient ambulated from day room gym to nursing station and required 1 rest break. Patient then stated back in day room gym (transported in [facility] dependently) and ambulated halfway to nursing station and was noted to have increased fatigue and required more assistance to advance R LE this time. Patient required cues to laterally shift weight to L in order to bring R LE through swing phase. Patient noted to enjoy this trial of the lite gait vs the first time as it is an adjustment from WB ambulating as before. Patient educated that this harness is to safely focus on advancing R LE through swing phase with less assistance to carry over to ambulation.  - Patient then request to perform standing balance activities when asked what he wanted to work on next. Patient statically stood in RW with no UE support (RW in place for safety). Patient able to statically stand for about 1 min 30 seconds. Patient then cued  to reach out of BOS by touching PTA hand in various directions (including crossing midline). Patient required supervision to minA to maintain balance. Patient noted to maintain upright posture, no R knee buckling and with no UE support this session!  NMR performed for improvements in motor control and coordination, balance, sequencing, judgement, and self confidence/ efficacy in performing all aspects of mobility at highest level of independence.   Patient supine in bed at end of session with brakes  locked, bed alarm set, and all needs within reach.      Therapy Documentation Precautions:  Precautions Precautions: Fall Precaution Comments: R hemi; dysarthria Restrictions Weight Bearing Restrictions: Yes  Therapy/Group: Individual Therapy  Laloni Rowton PTA 07/10/2022, 4:04 PM

## 2022-07-10 NOTE — Progress Notes (Signed)
Occupational Therapy Weekly Progress Note  Patient Details  Name: Jimmy Valentine MRN: [id number] Date of Birth: Nov 01, 1950  Beginning of progress report period: Jul 03, 2022 End of progress report period: July 10, 2022   Patient has met 4 of 5 short term goals.  He is making excellent progress with standing balance and ADL transfers. 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  Family education will be initiated next week.  Patient continues to demonstrate the following deficits: abnormal tone and unbalanced muscle activation and decreased sitting balance, decreased standing balance, decreased postural control, hemiplegia, and decreased balance strategies and therefore will continue to benefit from skilled OT intervention to enhance overall performance with BADL.  Patient progressing toward long term goals..  Continue plan of care.  OT Short Term Goals Week 1:  OT Short Term Goal 1 (Week 1): Pt will be able to sit EOB with close S and don shirt with mod A or less. OT Short Term Goal 1 - Progress (Week 1): Met OT Short Term Goal 2 (Week 1): Pt will be able to sit to stand from EOB with max A of 1 to  prep for LB dressing. OT Short Term Goal 2 - Progress (Week 1): Met OT Short Term Goal 3 (Week 1): Pt will complete squat pivot to toilet with max A of 1. OT Short Term Goal 3 - Progress (Week 1): Met OT Short Term Goal 4 (Week 1): Pt will be able to perform gentle self ROM on RUE with min A. OT Short Term Goal 4 - Progress (Week 1): Met Week 2:  OT Short Term Goal 1 (Week 2): Pt will complete toilet transfers with min A or less. OT Short Term Goal 1 - Progress (Week 2): Met OT Short Term Goal 2 (Week 2): Pt will complete squat pivot transfers to tub bench in walk in shower with min A or less. OT Short Term Goal 2 - Progress (Week 2): Progressing toward goal OT Short Term Goal 3 (Week 2): Pt will don shirt with min A or less. OT Short Term Goal 3 - Progress (Week 2): Met OT Short Term Goal 4  (Week 2): Pt will don pants with mod A or less. OT Short Term Goal 4 - Progress (Week 2): Met OT Short Term Goal 5 (Week 2): Pt will be able to hold stand balance with min A while he uses his L hand to adjust pants over hips. OT Short Term Goal 5 - Progress (Week 2): Met Week 3:  OT Short Term Goal 1 (Week 3): STGs = LTGs       Therapy Documentation Precautions:  Precautions Precautions: Fall Precaution Comments: R hemi; dysarthria Restrictions Weight Bearing Restrictions: Yes   ADL: ADL Grooming: Setup Upper Body Bathing: Supervision/safety (using long sponge to reach L arm) Where Assessed-Upper Body Bathing: Shower Lower Body Bathing: Minimal assistance (using long sponge to reach feet) Where Assessed-Lower Body Bathing: Shower Upper Body Dressing: Minimal cueing, Supervision/safety Where Assessed-Upper Body Dressing: Wheelchair Lower Body Dressing: Moderate assistance Where Assessed-Lower Body Dressing: Wheelchair Toileting: Moderate assistance Where Assessed-Toileting: Toilet (elevated seat with BSC over toilet) Toilet Transfer: Minimal assistance Toilet Transfer Method: Stand pivot Toilet Transfer Equipment: Raised toilet seat, Grab bars Film/video editor: Minimal assistance Film/video editor Method: Administrator: Emergency planning/management officer, Grab bars ADL Comments: .   Therapy/Group: Individual Therapy  Mylo Choi 07/10/2022, 12:34 PM

## 2022-07-10 NOTE — Progress Notes (Signed)
PROGRESS NOTE   Subjective/Complaints:  No issues overnite   ROS: Patient denies CP, SOB, N/V/D  Objective:   No results found. No results for input(s): "WBC", "HGB", "HCT", "PLT" in the last 72 hours.   No results for input(s): "NA", "K", "CL", "CO2", "GLUCOSE", "BUN", "CREATININE", "CALCIUM" in the last 72 hours.    Intake/Output Summary (Last 24 hours) at 07/10/2022 0803 Last data filed at 07/10/2022 0700 Gross per 24 hour  Intake 597 ml  Output 1100 ml  Net -503 ml         Physical Exam: Vital Signs Blood pressure 137/73, pulse 66, temperature 98.2 F (36.8 C), resp. rate 18, height 5\' 9"  (1.753 m), weight 97.5 kg, SpO2 97 %.   General: No acute distress Mood and affect are appropriate Heart: Regular rate and rhythm no rubs murmurs or extra sounds Lungs: Clear to auscultation, breathing unlabored, no rales or wheezes Abdomen: Positive bowel sounds, soft nontender to palpation, nondistended Extremities: No clubbing, cyanosis, or edema   Skin: No evidence of breakdown, no evidence of rash Neurologic: word finding deficits, speech more fluent. cranial nerves II through XII intact, motor strength is 5/5 in left and still 0/5 right  deltoid, bicep, tricep, grip. 1/5 hip flexor, 3- knee extensors, 1/5 ankle dorsiflexor and 2- plantar flexor. Extensor tone RLE with PROM still present  Sensory exam normal sensation to light touch  in bilateral upper and lower extremities Cerebellar exam weakness on right precludes testing  Musculoskeletal: Full range of motion in all 4 extremities. No joint swelling,     Assessment/Plan: 1. Functional deficits which require 3+ hours per day of interdisciplinary therapy in a comprehensive inpatient rehab setting. Physiatrist is providing close team supervision and 24 hour management of active medical problems listed below. Physiatrist and rehab team continue to assess barriers to  discharge/monitor patient progress toward functional and medical goals  Care Tool:  Bathing    Body parts bathed by patient: Abdomen, Chest, Right arm, Front perineal area, Right upper leg, Left upper leg, Face   Body parts bathed by helper: Left arm, Buttocks, Right lower leg, Left lower leg     Bathing assist Assist Level: Moderate Assistance - Patient 50 - 74%     Upper Body Dressing/Undressing Upper body dressing   What is the patient wearing?: Pull over shirt    Upper body assist Assist Level: Minimal Assistance - Patient > 75%    Lower Body Dressing/Undressing Lower body dressing      What is the patient wearing?: Incontinence brief, Pants     Lower body assist Assist for lower body dressing: Maximal Assistance - Patient 25 - 49%     Toileting Toileting    Toileting assist Assist for toileting: 2 Helpers     Transfers Chair/bed transfer  Transfers assist  Chair/bed transfer activity did not occur: Safety/medical concerns  Chair/bed transfer assist level: Moderate Assistance - Patient 50 - 74%     Locomotion Ambulation   Ambulation assist   Ambulation activity did not occur: Safety/medical concerns          Walk 10 feet activity   Assist  Walk 10 feet activity did not  occur: Safety/medical concerns        Walk 50 feet activity   Assist Walk 50 feet with 2 turns activity did not occur: Safety/medical concerns         Walk 150 feet activity   Assist Walk 150 feet activity did not occur: Safety/medical concerns         Walk 10 feet on uneven surface  activity   Assist Walk 10 feet on uneven surfaces activity did not occur: Safety/medical concerns         Wheelchair     Assist Is the patient using a wheelchair?: Yes Type of Wheelchair: Manual    Wheelchair assist level: Minimal Assistance - Patient > 75% Max wheelchair distance: 50 ft    Wheelchair 50 feet with 2 turns activity    Assist         Assist Level: Dependent - Patient 0%   Wheelchair 150 feet activity     Assist      Assist Level: Dependent - Patient 0%   Blood pressure 137/73, pulse 66, temperature 98.2 F (36.8 C), resp. rate 18, height 5\' 9"  (1.753 m), weight 97.5 kg, SpO2 97 %.  Medical Problem List and Plan: 1. 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 Large basal ganglia infarct. 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              -patient may  shower             -ELOS/Goals: 6/20             -Continue WHO and PRAFO             -Continue CIR therapies including PT, OT, and SLP     2.  Antithrombotics: -DVT/anticoagulation:  Pharmaceutical: Lovenox 4omg daily              -antiplatelet therapy: Aspirin and Plavix for three months followed by aspirin alone (started 5/20)   3. Pain Management: Tylenol, Robaxin as needed   4. Mood/Behavior/Sleep: LCSW to evaluate and provide emotional support             -antipsychotic agents: n/a             -trazodone as needed   5. Neuropsych/cognition: This patient is capable of making decisions on his own behalf.   6. Skin/Wound Care: Routine skin care checks   7. Fluids/Electrolytes/Nutrition: Routine Is and Os and follow-up chemistries             -continue dys3/advanced to thin liquids; SLP following   8: Hypertension: monitor TID and prn (home meds: amlodipine 10 mg QD, valsartan 320 mg daily)             -continue irbesartan 37.5 mg daily Vitals:   07/09/22 1949 07/10/22 0529  BP: (!) 140/85 137/73  Pulse: 74 66  Resp: 18 18  Temp: 97.7 F (36.5 C) 98.2 F (36.8 C)  SpO2: 97% 97%   -6/2   irbesartan increased to 75mg , BP now controlled 6/7   9: Hyperlipidemia: continue high intensity statin, change simvastatin to atorvastatin 80mg  daily   10: Hyponatremia, mild Na 132: follow-up BMP, will  fluid restrict - recheck on 5/27- improving cont fluid restriction , renal status stable    6/3 stable , mild hyponatremia , asymptomatic     Latest Ref Rng & Units 07/06/2022  7:03 AM 06/29/2022    5:58 AM 06/25/2022    5:54 AM  BMP  Glucose 70 - 99 mg/dL 161  096  045   BUN 8 - 23 mg/dL 13  16  17    Creatinine 0.61 - 1.24 mg/dL 4.09  8.11  9.14   Sodium 135 - 145 mmol/L 131  132  128   Potassium 3.5 - 5.1 mmol/L 3.8  3.9  4.2   Chloride 98 - 111 mmol/L 100  101  95   CO2 22 - 32 mmol/L 22  24  21    Calcium 8.9 - 10.3 mg/dL 9.0  8.7  8.8     11: History of gout: restart home colchicine   12: DM type II: HGB A1C 6.7, CBGs QID and SSI; carb  modified diet when advanced (home meds include metformin 1000 mg BID) CBG (last 3)  Recent Labs    07/09/22 1620 07/09/22 2050 07/10/22 0601  GLUCAP 131* 128* 75   6/7 good control a bit low , change SSI to sensitive, no hs coverage    13: RLE/RUE weakness:normal LE  venous duplex   14: Tobacco use: occasional cigars, advise cessation   15. Constipation             -resolved. Had bm   type 5 6/1  -only on prn senokot-s and sorbitol LOS: 16 days A FACE TO FACE EVALUATION WAS PERFORMED  Erick Colace 07/10/2022, 8:03 AM

## 2022-07-10 NOTE — Progress Notes (Signed)
Speech Language Pathology Weekly Progress and Session Note  Patient Details  Name: Jimmy Valentine MRN: [id number] Date of Birth: 1950/04/05  Beginning of progress report period: Jul 03, 2022 End of progress report period: July 10, 2022  Today's Date: 07/10/2022 SLP Individual Time: 7829-5621 SLP Individual Time Calculation (min): 57 min  Short Term Goals: Week 2: SLP Short Term Goal 1 (Week 2): Pt will utilize compensatory word-finding strategies to repair communication breakdowns with >80% accuracy given min cues. SLP Short Term Goal 1 - Progress (Week 2): Met SLP Short Term Goal 2 (Week 2): Patient will consume Dys 3 diet and thin liquids  without overt s/s of aspiration with supervision level verbal cues for use of swallowing compensatory strategies. SLP Short Term Goal 2 - Progress (Week 2): Met SLP Short Term Goal 3 (Week 2): Pt will consume regular trials with efficient oral prep and transit and use of compensatory swallow strategies to clear oral residue given supervision assistance. SLP Short Term Goal 3 - Progress (Week 2): Met SLP Short Term Goal 4 (Week 2): Patient will demonstrate 90% intelligibilty at the sentence level with min A verbal cues for use of speech intelligibility strategies. SLP Short Term Goal 4 - Progress (Week 2): Progressing toward goal    New Short Term Goals: Week 3: SLP Short Term Goal 1 (Week 3): Patient will demonstrate 90% intelligibilty at the sentence level with min A verbal cues for use of speech intelligibility strategies. SLP Short Term Goal 2 (Week 3): Patient will consume a regular diet and thin liquids without overt s/s of aspiration with supervision level verbal cues for use of swallowing compensatory strategies. SLP Short Term Goal 3 (Week 3): Pt will utilize compensatory word-finding strategies to repair communication breakdowns with >90% accuracy given min cues. SLP Short Term Goal 4 (Week 3): Pt will complete oral motor exercises to aid oral  strength and coordination and improve control of secretions with min cues.  Weekly Progress Updates:  Patient has made progressive gains and has met 3 of 4 STGs this reporting period. Patient is currently completing cognitive tasks with overall min A-supervision level with  min cues in regards to utilization of compensatory speech strategies and word finding strategies. Patient is currently consuming a Dys 3 diet with thin liquids and able to independently implement swallowing compensatory strategies. Diet advanced to regular consistency at pt's request today. Patient and family education is ongoing. Patient would benefit from continued skilled SLP intervention to maximize communication and swallowing functioning and overall functional independence prior to discharge.      Intensity: Minumum of 1-2 x/day, 30 to 90 minutes Frequency: 1 to 3 out of 7 days Duration/Length of Stay: 6/20 Treatment/Interventions: Dysphagia/aspiration precaution training;Speech/Language facilitation;Internal/external aids;Functional tasks;Patient/family education;Multimodal communication approach;Therapeutic Exercise   Daily Session  Skilled Therapeutic Interventions: Pt seen for skilled SLP session to address swallowing and speech goals. Pt requested to advance diet to regular consistency with note in chart to have kitchen or team members cut up meats for him. He is independently clearing any oral residue with lingual sweep. He did report concern of intermittent anterior spillage of oral secretions due to oral weakness and reduced oral control. Introduced alternating labial pucker/retraction exercise. 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 No concerns for apraxia of speech at this time. Addressed clear speech strategies in informal/formal conversation with use of various topics to guide conversation. Conversational speech intelligibility observed to be >85% today. Pt able to correct  errors  with min cues. Pt left sitting up in w/c with call bell and needs in reach. Continue SLP PoC.    Pain  None reported   Therapy/Group: Individual Therapy  Ellery Plunk 07/10/2022, 12:24 PM

## 2022-07-11 LAB — GLUCOSE, CAPILLARY
Glucose-Capillary: 125 mg/dL — ABNORMAL HIGH (ref 70–99)
Glucose-Capillary: 140 mg/dL — ABNORMAL HIGH (ref 70–99)
Glucose-Capillary: 183 mg/dL — ABNORMAL HIGH (ref 70–99)
Glucose-Capillary: 121 mg/dL — ABNORMAL HIGH (ref 70–99)

## 2022-07-11 NOTE — Progress Notes (Signed)
Occupational Therapy Session Note  Patient Details  Name: Jimmy Valentine MRN: [id number] Date of Birth: August 25, 1950  Today's Date: 07/11/2022 OT Individual Time: 1510-1610 OT Individual Time Calculation (min): 60 min    Short Term Goals: Week 3:  OT Short Term Goal 1 (Week 3): STGs = LTGs  Skilled Therapeutic Interventions/Progress Updates:    Pt received in bed ready for therapy.  Focus of therapy session on RUE NMR along with standing balance.       Transfers: -bed mobility out of and into bed with min A for guiding legs and upper body.   -close guarding with sit to stands from bed and w/c to RW by pt pushing up to full stand before using RW.  Cues for forward lean -stand pivot 2x with RW with min A to make small adjustments of R foot.  Balance: -stood with close S with no UE support for static stand and CGA during small weight shifts.  Neuromuscular Re-Education:  -pt taken to gym to stand at adjustable table for RUE wt bearing activities with hand over hand facilitation of moving hand forward and back simulating a sanding activity.  OT Moved hand in various directions so pt would have to weight shift out of his base of support.  Pt began actively moving shoulder forward!  This is a big step for him.   -pt tolerated standing for 10 minutes at a time for these hand over hand guiding movements and him progressing to some active sh flexion.   -sitting in chair with arm on table pt worked on some active arm adduction to slide arm toward him.   Pt is making slow but gradual progress with his arm.     Pt resting in bed with all needs met. Alarm set and call light in reach.    Therapy Documentation Precautions:  Precautions Precautions: Fall Precaution Comments: R hemi; dysarthria Restrictions Weight Bearing Restrictions: Yes   Pain:  No c/o pain  ADL: ADL Grooming: Setup Upper Body Bathing: Supervision/safety (using long sponge to reach L arm) Where Assessed-Upper Body  Bathing: Shower Lower Body Bathing: Minimal assistance (using long sponge to reach feet) Where Assessed-Lower Body Bathing: Shower Upper Body Dressing: Minimal cueing, Supervision/safety Where Assessed-Upper Body Dressing: Wheelchair Lower Body Dressing: Moderate assistance Where Assessed-Lower Body Dressing: Wheelchair Toileting: Moderate assistance Where Assessed-Toileting: Toilet (elevated seat with BSC over toilet) Toilet Transfer: Minimal assistance Toilet Transfer Method: Stand pivot Toilet Transfer Equipment: Raised toilet seat, Grab bars Film/video editor: Minimal assistance Film/video editor Method: Administrator: Emergency planning/management officer, Grab bars ADL Comments: .  Therapy/Group: Individual Therapy  Aicha Clingenpeel 07/11/2022, 12:44 PM

## 2022-07-11 NOTE — Progress Notes (Signed)
Physical Therapy Weekly Progress Note  Patient Details  Name: Jimmy Valentine MRN: [id number] Date of Birth: 1950/05/04  Beginning of progress report period: Jun 25, 2022 End of progress report period: July 11, 2022   Patient has met 3 of 5 short term goals, and is progressing towards 2 of 5 short term goals. Patient has made functional progress through demonstration of increasing R LE muscle activation. Patient completes bed mobility with modA (progressing), and completes transfers with minA. Patient has met goals of unsupported sitting balance as he is able to maintain static sitting balance with no support, and has initiated stair training (max-heavy maxA). Patient is making progress towards ambulating 89' using RW with minA (currently modA). Patient participates well in PT sessions, and pushes himself to further his goal of increasing his physical function/presentation. Patient has also made functional progress in ability to statically stand with no support for greater than 1 minute with no LOB. Patient is currently limited by R UE/LE hemiparesis that limits lesser gait assistance, and little to no activation in R UE that limits bed mobility and potentially affects gait.   Patient continues to demonstrate the following deficits muscle weakness and muscle paralysis, decreased cardiorespiratoy endurance, impaired timing and sequencing, abnormal tone, unbalanced muscle activation, and decreased coordination,  , and decreased standing balance, decreased postural control, hemiplegia, and decreased balance strategies and therefore will continue to benefit from skilled PT intervention to increase functional independence with mobility.  Patient progressing toward long term goals..  Continue plan of care.  Short Term Goals: Week 2:  PT Short Term Goal 1 (Week 2): Pt will complete bed mobility with MinA. PT Short Term Goal 1 - Progress (Week 2): Progressing toward goal PT Short Term Goal 2 (Week 2): Pt  will complete bed<>chair transfers with ModA. PT Short Term Goal 2 - Progress (Week 2): Met PT Short Term Goal 3 (Week 2): pt will maintain unsupported sitting balance to intermittent CGA. PT Short Term Goal 3 - Progress (Week 2): Met PT Short Term Goal 4 (Week 2): Pt will ambulate at least 75 ft using RW with MinA. PT Short Term Goal 4 - Progress (Week 2): Progressing toward goal PT Short Term Goal 5 (Week 2): pt will initiate stair training. PT Short Term Goal 5 - Progress (Week 2): Met Week 3:  PT Short Term Goal 1 (Week 3): Pt will perform supine<>sit with min assist from L EOB PT Short Term Goal 2 (Week 3): Pt will perform sit<>stands using LRAD with min assist PT Short Term Goal 3 (Week 3): Pt will perform bed<>chair transfers using LRAD with min assist PT Short Term Goal 4 (Week 3): Pt will ambulate at least 59ft using LRAD with min assist PT Short Term Goal 5 (Week 3): Pt will navigate 4 steps using HRs with mod assist of 1   Skilled Therapeutic Interventions: Ambulation/gait training;Balance/vestibular training;Cognitive remediation/compensation;Community reintegration;Discharge planning;Disease management/prevention;DME/adaptive equipment instruction;Functional electrical stimulation;Functional mobility training;Neuromuscular re-education;Pain management;Patient/family education;Psychosocial support;Skin care/wound management;Splinting/orthotics;Stair training;Therapeutic Activities;Therapeutic Exercise;UE/LE Strength taining/ROM;UE/LE Coordination activities;Visual/perceptual remediation/compensation;Wheelchair propulsion/positioning   Therapy Documentation Precautions:  Precautions Precautions: Fall Precaution Comments: R hemi; dysarthria Restrictions Weight Bearing Restrictions: Yes  Dominic Sandoval PTA 07/11/2022, 4:05 PM

## 2022-07-11 NOTE — Progress Notes (Signed)
PROGRESS NOTE   Subjective/Complaints:  No events overnight. No acute complaints.  Vitals stable Last BM 6/7   ROS: Patient denies CP, SOB, N/V/D  Objective:   No results found. No results for input(s): "WBC", "HGB", "HCT", "PLT" in the last 72 hours.   No results for input(s): "NA", "K", "CL", "CO2", "GLUCOSE", "BUN", "CREATININE", "CALCIUM" in the last 72 hours.    Intake/Output Summary (Last 24 hours) at 07/11/2022 1012 Last data filed at 07/11/2022 0738 Gross per 24 hour  Intake 576 ml  Output 950 ml  Net -374 ml         Physical Exam: Vital Signs Blood pressure 123/81, pulse 66, temperature 97.6 F (36.4 C), temperature source Oral, resp. rate 18, height 5\' 9"  (1.753 m), weight 97.5 kg, SpO2 95 %.   General: No acute distress. Sitting up in bed.  Mood and affect are appropriate Heart: Regular rate and rhythm no rubs murmurs or extra sounds Lungs: Clear to auscultation, breathing unlabored, no rales or wheezes Abdomen: Positive bowel sounds, soft nontender to palpation, nondistended Extremities: No clubbing, cyanosis, or edema. + R middle DIP amputation   Skin: No evidence of breakdown, no evidence of rash Neurologic: word finding deficits, speech more fluent. cranial nerves II through XII intact, motor strength is 5/5 in left and still 0/5 right  deltoid, bicep, tricep, grip. 1/5 hip flexor, 3- knee extensors, 1/5 ankle dorsiflexor and 2- plantar flexor. Extensor tone RLE with PROM still present  Sensory exam normal sensation to light touch  in bilateral upper and lower extremities Cerebellar exam weakness on right precludes testing  Musculoskeletal: Full range of motion in all 4 extremities. No joint swelling,     Assessment/Plan: 1. Functional deficits which require 3+ hours per day of interdisciplinary therapy in a comprehensive inpatient rehab setting. Physiatrist is providing close team supervision  and 24 hour management of active medical problems listed below. Physiatrist and rehab team continue to assess barriers to discharge/monitor patient progress toward functional and medical goals  Care Tool:  Bathing    Body parts bathed by patient: Abdomen, Chest, Right arm, Front perineal area, Right upper leg, Left upper leg, Face, Left arm, Left lower leg, Right lower leg   Body parts bathed by helper: Left arm, Buttocks, Right lower leg, Left lower leg     Bathing assist Assist Level: Minimal Assistance - Patient > 75%     Upper Body Dressing/Undressing Upper body dressing   What is the patient wearing?: Pull over shirt    Upper body assist Assist Level: Supervision/Verbal cueing    Lower Body Dressing/Undressing Lower body dressing      What is the patient wearing?: Incontinence brief, Pants     Lower body assist Assist for lower body dressing: Moderate Assistance - Patient 50 - 74%     Toileting Toileting    Toileting assist Assist for toileting: Moderate Assistance - Patient 50 - 74%     Transfers Chair/bed transfer  Transfers assist  Chair/bed transfer activity did not occur: Safety/medical concerns  Chair/bed transfer assist level: Moderate Assistance - Patient 50 - 74%     Locomotion Ambulation   Ambulation assist  Ambulation activity did not occur: Safety/medical concerns          Walk 10 feet activity   Assist  Walk 10 feet activity did not occur: Safety/medical concerns        Walk 50 feet activity   Assist Walk 50 feet with 2 turns activity did not occur: Safety/medical concerns         Walk 150 feet activity   Assist Walk 150 feet activity did not occur: Safety/medical concerns         Walk 10 feet on uneven surface  activity   Assist Walk 10 feet on uneven surfaces activity did not occur: Safety/medical concerns         Wheelchair     Assist Is the patient using a wheelchair?: Yes Type of Wheelchair:  Manual    Wheelchair assist level: Minimal Assistance - Patient > 75% Max wheelchair distance: 50 ft    Wheelchair 50 feet with 2 turns activity    Assist        Assist Level: Dependent - Patient 0%   Wheelchair 150 feet activity     Assist      Assist Level: Dependent - Patient 0%   Blood pressure 123/81, pulse 66, temperature 97.6 F (36.4 C), temperature source Oral, resp. rate 18, height 5\' 9"  (1.753 m), weight 97.5 kg, SpO2 95 %.  Medical Problem List and Plan: 1. 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 Large basal ganglia infarct. 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              -patient may  shower             -ELOS/Goals: 6/20             -Continue WHO and PRAFO             -Continue CIR therapies including PT, OT, and SLP     2.  Antithrombotics: -DVT/anticoagulation:  Pharmaceutical: Lovenox 4omg daily              -antiplatelet therapy: Aspirin and Plavix for three months followed by aspirin alone (started 5/20)   3. Pain Management: Tylenol, Robaxin as needed   4. Mood/Behavior/Sleep: LCSW to evaluate and provide emotional support             -antipsychotic agents: n/a             -trazodone as needed   5. Neuropsych/cognition: This patient is capable of making decisions on his own behalf.   6. Skin/Wound Care: Routine skin care checks   7. Fluids/Electrolytes/Nutrition: Routine Is and Os and follow-up chemistries             -continue dys3/advanced to thin liquids; SLP following   8: Hypertension: monitor TID and prn (home meds: amlodipine 10 mg QD, valsartan 320 mg daily)             -continue irbesartan 37.5 mg daily Vitals:   07/10/22 2026 07/11/22 0555  BP: 132/77 123/81  Pulse: 72 66  Resp: 18 18  Temp: 98 F (36.7 C) 97.6 F (36.4 C)  SpO2: 96% 95%   -6/2   irbesartan  increased to 75mg , BP now controlled 6/7   9: Hyperlipidemia: continue high intensity statin, change simvastatin to atorvastatin 80mg  daily   10: Hyponatremia, mild Na 132: follow-up BMP, will fluid restrict - recheck on  5/27- improving cont fluid restriction , renal status stable    6/3 stable , mild hyponatremia , asymptomatic     Latest Ref Rng & Units 07/06/2022    7:03 AM 06/29/2022    5:58 AM 06/25/2022    5:54 AM  BMP  Glucose 70 - 99 mg/dL 161  096  045   BUN 8 - 23 mg/dL 13  16  17    Creatinine 0.61 - 1.24 mg/dL 4.09  8.11  9.14   Sodium 135 - 145 mmol/L 131  132  128   Potassium 3.5 - 5.1 mmol/L 3.8  3.9  4.2   Chloride 98 - 111 mmol/L 100  101  95   CO2 22 - 32 mmol/L 22  24  21    Calcium 8.9 - 10.3 mg/dL 9.0  8.7  8.8     11: History of gout: restart home colchicine   12: DM type II: HGB A1C 6.7, CBGs QID and SSI; carb  modified diet when advanced (home meds include metformin 1000 mg BID) CBG (last 3)  Recent Labs    07/10/22 1645 07/10/22 2116 07/11/22 0635  GLUCAP 118* 163* 125*   6/7 good control a bit low , change SSI to sensitive, no hs coverage    13: RLE/RUE weakness:normal LE  venous duplex   14: Tobacco use: occasional cigars, advise cessation   15. Constipation             -resolved. Had bm   type 5 6/1  -only on prn senokot-s and sorbitol   - LBM 6/7 LOS: 17 days A FACE TO FACE EVALUATION WAS PERFORMED  Angelina Sheriff 07/11/2022, 10:12 AM

## 2022-07-11 NOTE — Progress Notes (Signed)
Physical Therapy Session Note  Patient Details  Name: Jimmy Valentine MRN: [id number] Date of Birth: 24-Mar-1950  Today's Date: 07/11/2022 PT Individual Time: 0849-1000 PT Individual Time Calculation (min): 71 min   Short Term Goals: Week 1:  PT Short Term Goal 1 (Week 1): Pt will complete bed mobility with modA PT Short Term Goal 1 - Progress (Week 1): Met PT Short Term Goal 2 (Week 1): Pt will complete bed<>chair transfers with maxA of 1 person PT Short Term Goal 2 - Progress (Week 1): Met PT Short Term Goal 3 (Week 1): Pt will maintain unsupported sitting balance for 30 seconds PT Short Term Goal 3 - Progress (Week 1): Met PT Short Term Goal 4 (Week 1): Pt will participate in functional outcome measure to assess falls risk PT Short Term Goal 4 - Progress (Week 1): Met Week 2:  PT Short Term Goal 1 (Week 2): Pt will complete bed mobility with MinA. PT Short Term Goal 2 (Week 2): Pt will complete bed<>chair transfers with ModA. PT Short Term Goal 3 (Week 2): pt will maintain unsupported sitting balance to intermittent CGA. PT Short Term Goal 4 (Week 2): Pt will ambulate at least 75 ft using RW with MinA. PT Short Term Goal 5 (Week 2): pt will initiate stair training.   Skilled Therapeutic Interventions/Progress Updates: Patient supine in bed on entrance to room. Patient alert and agreeable to PT session.   Patient reported no pain and endorses getting adequate sleep. Today's session focused on endurance ambulation while on treadmill in lite gait system.  Therapeutic Activity: Bed Mobility: Pt performed supine<>sit on EOB with modA (mod-maxA to advance R LE off bed). Patient required mod-maxA to elevate R LE onto bed at end of session with cues to contract R LE musculature. Patient then required to be adjust in bed to [facility] and was cued to use L LE to perform slight bridge upwards, and L UE to pull on HOB rail. PTA and attending nurse assisted with use of chuck pad (modA +2). Transfers: Pt  performed squat pivot transfers throughout session with min-light modA. Provided VC for patient to ensure B LE are in transfer ready position. Patient performed sit to stands to lite gait handle bar with light minA/CGA (slack on harness).   Gait Training:  Lite Gait harness donned with patient ambulating on treadmill starting at lowest setting, and then progressed two levels in speed for a total of 156' total throughout session. Patient instructed to laterally lean to the L and required max cues to do so in order to advance R LE through swing phase with PTA providing heavy mod/maxA (no buckling throughout). Patient cued to take multiple steps with L LE prior to advancing R LE in order to facilitate R hip flexor greater contraction. PTA then added yellow theraband on lite gait handle bar pulling R LE in the direction of hip flexion to promote higher step clearance for greater stepping lengths. Patient required multiple rest breaks throughout.  Patient supine in bed at end of session with brakes locked, bed alarm set, and all needs within reach.      Therapy Documentation Precautions:  Precautions Precautions: Fall Precaution Comments: R hemi; dysarthria Restrictions Weight Bearing Restrictions: Yes  Therapy/Group: Individual Therapy  Navpreet Szczygiel PTA 07/11/2022, 12:34 PM

## 2022-07-12 LAB — GLUCOSE, CAPILLARY
Glucose-Capillary: 144 mg/dL — ABNORMAL HIGH (ref 70–99)
Glucose-Capillary: 123 mg/dL — ABNORMAL HIGH (ref 70–99)
Glucose-Capillary: 124 mg/dL — ABNORMAL HIGH (ref 70–99)
Glucose-Capillary: 205 mg/dL — ABNORMAL HIGH (ref 70–99)

## 2022-07-12 NOTE — Progress Notes (Signed)
PROGRESS NOTE   Subjective/Complaints:  No events overnight. No acute complaints.  Vitals stable Last BM 6/8   ROS: Patient denies CP, SOB, N/V/D  Objective:   No results found. No results for input(s): "WBC", "HGB", "HCT", "PLT" in the last 72 hours.   No results for input(s): "NA", "K", "CL", "CO2", "GLUCOSE", "BUN", "CREATININE", "CALCIUM" in the last 72 hours.    Intake/Output Summary (Last 24 hours) at 07/12/2022 1143 Last data filed at 07/12/2022 0755 Gross per 24 hour  Intake 800 ml  Output 800 ml  Net 0 ml         Physical Exam: Vital Signs Blood pressure (!) 145/77, pulse 72, temperature 97.9 F (36.6 C), temperature source Oral, resp. rate 19, height 5\' 9"  (1.753 m), weight 97.5 kg, SpO2 96 %.   General: No acute distress.  Laying in bed.  Family at bedside. Mood and affect are appropriate Heart: Regular rate and rhythm no rubs murmurs or extra sounds Lungs: Clear to auscultation, breathing unlabored, no rales or wheezes Abdomen: Positive bowel sounds, soft nontender to palpation, nondistended Extremities: No clubbing, cyanosis, or edema. + R middle DIP amputation   Skin: No evidence of breakdown, no evidence of rash Neurologic: No apparent word finding deficits. 0/5 right UE. 1/5 hip flexor, 3- knee extensors, 1/5 ankle dorsiflexor and 2- plantar flexor. Extensor tone RLE with PROM still present  Sensory exam normal sensation to light touch  in bilateral upper and lower extremities Cerebellar exam weakness on right precludes testing  Musculoskeletal: Full range of motion in all 4 extremities. No joint swelling,     Assessment/Plan: 1. Functional deficits which require 3+ hours per day of interdisciplinary therapy in a comprehensive inpatient rehab setting. Physiatrist is providing close team supervision and 24 hour management of active medical problems listed below. Physiatrist and rehab team  continue to assess barriers to discharge/monitor patient progress toward functional and medical goals  Care Tool:  Bathing    Body parts bathed by patient: Abdomen, Chest, Right arm, Front perineal area, Right upper leg, Left upper leg, Face, Left arm, Left lower leg, Right lower leg   Body parts bathed by helper: Left arm, Buttocks, Right lower leg, Left lower leg     Bathing assist Assist Level: Minimal Assistance - Patient > 75%     Upper Body Dressing/Undressing Upper body dressing   What is the patient wearing?: Pull over shirt    Upper body assist Assist Level: Supervision/Verbal cueing    Lower Body Dressing/Undressing Lower body dressing      What is the patient wearing?: Incontinence brief, Pants     Lower body assist Assist for lower body dressing: Moderate Assistance - Patient 50 - 74%     Toileting Toileting    Toileting assist Assist for toileting: Moderate Assistance - Patient 50 - 74%     Transfers Chair/bed transfer  Transfers assist  Chair/bed transfer activity did not occur: Safety/medical concerns  Chair/bed transfer assist level: Minimal Assistance - Patient > 75%     Locomotion Ambulation   Ambulation assist   Ambulation activity did not occur: Safety/medical concerns  Assist level: Moderate Assistance - Patient 50 -  74%   Max distance: 70   Walk 10 feet activity   Assist  Walk 10 feet activity did not occur: Safety/medical concerns  Assist level: Moderate Assistance - Patient - 50 - 74% Assistive device: Walker-rolling (R hemi-grip)   Walk 50 feet activity   Assist Walk 50 feet with 2 turns activity did not occur: Safety/medical concerns         Walk 150 feet activity   Assist Walk 150 feet activity did not occur: Safety/medical concerns         Walk 10 feet on uneven surface  activity   Assist Walk 10 feet on uneven surfaces activity did not occur: Safety/medical concerns          Wheelchair     Assist Is the patient using a wheelchair?: Yes Type of Wheelchair: Manual    Wheelchair assist level: Minimal Assistance - Patient > 75% Max wheelchair distance: 50 ft    Wheelchair 50 feet with 2 turns activity    Assist        Assist Level: Dependent - Patient 0%   Wheelchair 150 feet activity     Assist      Assist Level: Dependent - Patient 0%   Blood pressure (!) 145/77, pulse 72, temperature 97.9 F (36.6 C), temperature source Oral, resp. rate 19, height 5\' 9"  (1.753 m), weight 97.5 kg, SpO2 96 %.  Medical Problem List and Plan: 1. 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 Large basal ganglia infarct. 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              -patient may  shower             -ELOS/Goals: 6/20             -Continue WHO and PRAFO             -Continue CIR therapies including PT, OT, and SLP     2.  Antithrombotics: -DVT/anticoagulation:  Pharmaceutical: Lovenox 4omg daily              -antiplatelet therapy: Aspirin and Plavix for three months followed by aspirin alone (started 5/20)   3. Pain Management: Tylenol, Robaxin as needed   4. Mood/Behavior/Sleep: LCSW to evaluate and provide emotional support             -antipsychotic agents: n/a             -trazodone as needed   5. Neuropsych/cognition: This patient is capable of making decisions on his own behalf.   6. Skin/Wound Care: Routine skin care checks   7. Fluids/Electrolytes/Nutrition: Routine Is and Os and follow-up chemistries             -continue dys3/advanced to thin liquids; SLP following   8: Hypertension: monitor TID and prn (home meds: amlodipine 10 mg QD, valsartan 320 mg daily)             -continue irbesartan 37.5 mg daily Vitals:   07/11/22 1349 07/11/22 1939  BP: (!) 140/77 (!) 145/77  Pulse: 72  72  Resp: 16 19  Temp: 97.6 F (36.4 C) 97.9 F (36.6 C)  SpO2: 97% 96%   -6/2   irbesartan increased to 75mg , BP now controlled 6/7 ` - 6/8-9: Mildly hypertensive, overall stable.  Monitor.   9: Hyperlipidemia: continue high intensity statin, change simvastatin to atorvastatin 80mg  daily  10: Hyponatremia, mild Na 132: follow-up BMP, will fluid restrict - recheck on 5/27- improving cont fluid restriction , renal status stable    6/3 stable , mild hyponatremia , asymptomatic     Latest Ref Rng & Units 07/06/2022    7:03 AM 06/29/2022    5:58 AM 06/25/2022    5:54 AM  BMP  Glucose 70 - 99 mg/dL 119  147  829   BUN 8 - 23 mg/dL 13  16  17    Creatinine 0.61 - 1.24 mg/dL 5.62  1.30  8.65   Sodium 135 - 145 mmol/L 131  132  128   Potassium 3.5 - 5.1 mmol/L 3.8  3.9  4.2   Chloride 98 - 111 mmol/L 100  101  95   CO2 22 - 32 mmol/L 22  24  21    Calcium 8.9 - 10.3 mg/dL 9.0  8.7  8.8     11: History of gout: restart home colchicine   12: DM type II: HGB A1C 6.7, CBGs QID and SSI; carb  modified diet when advanced (home meds include metformin 1000 mg BID) CBG (last 3)  Recent Labs    07/11/22 2105 07/12/22 0543 07/12/22 1128  GLUCAP 121* 144* 123*   6/7 good control a bit low , change SSI to sensitive, no hs coverage  6/8-9: Well controlled    13: RLE/RUE weakness:normal LE  venous duplex   14: Tobacco use: occasional cigars, advise cessation   15. Constipation             -resolved. Had bm   type 5 6/1  -only on prn senokot-s and sorbitol   - LBM 6/8 LOS: 18 days A FACE TO FACE EVALUATION WAS PERFORMED  Angelina Sheriff 07/12/2022, 11:43 AM

## 2022-07-13 ENCOUNTER — Other Ambulatory Visit: Payer: Self-pay | Admitting: Internal Medicine

## 2022-07-13 LAB — BASIC METABOLIC PANEL
Anion gap: 10 (ref 5–15)
BUN: 16 mg/dL (ref 8–23)
CO2: 22 mmol/L (ref 22–32)
Calcium: 9.1 mg/dL (ref 8.9–10.3)
Chloride: 101 mmol/L (ref 98–111)
Creatinine, Ser: 0.89 mg/dL (ref 0.61–1.24)
GFR, Estimated: >60 mL/min (ref >60)
Glucose, Bld: 136 mg/dL — ABNORMAL HIGH (ref 70–99)
Potassium: 3.9 mmol/L (ref 3.5–5.1)
Sodium: 133 mmol/L — ABNORMAL LOW (ref 135–145)

## 2022-07-13 LAB — GLUCOSE, CAPILLARY
Glucose-Capillary: 131 mg/dL — ABNORMAL HIGH (ref 70–99)
Glucose-Capillary: 127 mg/dL — ABNORMAL HIGH (ref 70–99)
Glucose-Capillary: 114 mg/dL — ABNORMAL HIGH (ref 70–99)
Glucose-Capillary: 182 mg/dL — ABNORMAL HIGH (ref 70–99)

## 2022-07-13 LAB — CBC
HCT: 42.4 % (ref 39.0–52.0)
Hemoglobin: 14.9 g/dL (ref 13.0–17.0)
MCH: 31.8 pg (ref 26.0–34.0)
MCHC: 35.1 g/dL (ref 30.0–36.0)
MCV: 90.6 fL (ref 80.0–100.0)
Platelets: 291 10*3/uL (ref 150–400)
RBC: 4.68 MIL/uL (ref 4.22–5.81)
RDW: 12.2 % (ref 11.5–15.5)
WBC: 11.7 10*3/uL — ABNORMAL HIGH (ref 4.0–10.5)
nRBC: 0 % (ref 0.0–0.2)

## 2022-07-13 MED ORDER — COLCHICINE 0.6 MG PO TABS
0.6000 mg | ORAL_TABLET | Freq: Two times a day (BID) | ORAL | Status: AC
  Administered 2022-07-13 – 2022-07-16 (×6): 0.6 mg via ORAL
  Filled 2022-07-13 (×6): qty 1

## 2022-07-13 NOTE — Progress Notes (Signed)
PROGRESS NOTE   Subjective/Complaints:  Pt asking about extension of stay   Labs reviewed  ROS: Patient denies CP, SOB, N/V/D  Objective:   No results found. Recent Labs    07/13/22 0640  WBC 11.7*  HGB 14.9  HCT 42.4  PLT 291     Recent Labs    07/13/22 0640  NA 133*  K 3.9  CL 101  CO2 22  GLUCOSE 136*  BUN 16  CREATININE 0.89  CALCIUM 9.1      Intake/Output Summary (Last 24 hours) at 07/13/2022 0842 Last data filed at 07/13/2022 0735 Gross per 24 hour  Intake 478 ml  Output 725 ml  Net -247 ml         Physical Exam: Vital Signs Blood pressure (!) 145/72, pulse 73, temperature 97.7 F (36.5 C), temperature source Oral, resp. rate 20, height 5\' 9"  (1.753 m), weight 97.5 kg, SpO2 96 %.   General: No acute distress.  Laying in bed.  Family at bedside. Mood and affect are appropriate Heart: Regular rate and rhythm no rubs murmurs or extra sounds Lungs: Clear to auscultation, breathing unlabored, no rales or wheezes Abdomen: Positive bowel sounds, soft nontender to palpation, nondistended Extremities: No clubbing, cyanosis, or edema. + R middle DIP amputation   Skin: No evidence of breakdown, no evidence of rash Neurologic: No apparent word finding deficits. 0/5 right UE. 3-/5 hip flexor, 3- knee extensors, 1/5 ankle dorsiflexor and 2- plantar flexor. Extensor tone RLE with PROM still present  Sensory exam normal sensation to light touch  in bilateral upper and lower extremities Cerebellar exam weakness on right precludes testing  Musculoskeletal: Full range of motion in all 4 extremities. No joint swelling,     Assessment/Plan: 1. Functional deficits which require 3+ hours per day of interdisciplinary therapy in a comprehensive inpatient rehab setting. Physiatrist is providing close team supervision and 24 hour management of active medical problems listed below. Physiatrist and rehab team  continue to assess barriers to discharge/monitor patient progress toward functional and medical goals  Care Tool:  Bathing    Body parts bathed by patient: Abdomen, Chest, Right arm, Front perineal area, Right upper leg, Left upper leg, Face, Left arm, Left lower leg, Right lower leg   Body parts bathed by helper: Left arm, Buttocks, Right lower leg, Left lower leg     Bathing assist Assist Level: Minimal Assistance - Patient > 75%     Upper Body Dressing/Undressing Upper body dressing   What is the patient wearing?: Pull over shirt    Upper body assist Assist Level: Supervision/Verbal cueing    Lower Body Dressing/Undressing Lower body dressing      What is the patient wearing?: Incontinence brief, Pants     Lower body assist Assist for lower body dressing: Moderate Assistance - Patient 50 - 74%     Toileting Toileting    Toileting assist Assist for toileting: Moderate Assistance - Patient 50 - 74%     Transfers Chair/bed transfer  Transfers assist  Chair/bed transfer activity did not occur: Safety/medical concerns  Chair/bed transfer assist level: Minimal Assistance - Patient > 75%     Locomotion Ambulation  Ambulation assist   Ambulation activity did not occur: Safety/medical concerns  Assist level: Moderate Assistance - Patient 50 - 74%   Max distance: 70   Walk 10 feet activity   Assist  Walk 10 feet activity did not occur: Safety/medical concerns  Assist level: Moderate Assistance - Patient - 50 - 74% Assistive device: Walker-rolling (R hemi-grip)   Walk 50 feet activity   Assist Walk 50 feet with 2 turns activity did not occur: Safety/medical concerns         Walk 150 feet activity   Assist Walk 150 feet activity did not occur: Safety/medical concerns         Walk 10 feet on uneven surface  activity   Assist Walk 10 feet on uneven surfaces activity did not occur: Safety/medical concerns          Wheelchair     Assist Is the patient using a wheelchair?: Yes Type of Wheelchair: Manual    Wheelchair assist level: Minimal Assistance - Patient > 75% Max wheelchair distance: 50 ft    Wheelchair 50 feet with 2 turns activity    Assist        Assist Level: Dependent - Patient 0%   Wheelchair 150 feet activity     Assist      Assist Level: Dependent - Patient 0%   Blood pressure (!) 145/72, pulse 73, temperature 97.7 F (36.5 C), temperature source Oral, resp. rate 20, height 5\' 9"  (1.753 m), weight 97.5 kg, SpO2 96 %.  Medical Problem List and Plan: 1. 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 Large basal ganglia infarct. 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              -patient may  shower             -ELOS/Goals: 6/20             -Continue WHO and PRAFO             -Continue CIR therapies including PT, OT, and SLP     2.  Antithrombotics: -DVT/anticoagulation:  Pharmaceutical: Lovenox 4omg daily              -antiplatelet therapy: Aspirin and Plavix for three months followed by aspirin alone (started 5/20)   3. Pain Management: Tylenol, Robaxin as needed   4. Mood/Behavior/Sleep: LCSW to evaluate and provide emotional support             -antipsychotic agents: n/a             -trazodone as needed   5. Neuropsych/cognition: This patient is capable of making decisions on his own behalf.   6. Skin/Wound Care: Routine skin care checks   7. Fluids/Electrolytes/Nutrition: Routine Is and Os and follow-up chemistries             -continue dys3/advanced to thin liquids; SLP following   8: Hypertension: monitor TID and prn (home meds: amlodipine 10 mg QD, valsartan 320 mg daily)             -continue irbesartan 37.5 mg daily Vitals:   07/12/22 2005 07/13/22 0321  BP: (!) 143/78 (!) 145/72  Pulse: 75  73  Resp: 18 20  Temp: 98 F (36.7 C) 97.7 F (36.5 C)  SpO2: 96% 96%   -6/2   irbesartan increased to 75mg , BP now controlled 6/7 ` - 6/10  fair control   9: Hyperlipidemia: continue high intensity statin, change simvastatin to atorvastatin 80mg  daily   10: Hyponatremia, mild Na 132: follow-up BMP, will fluid restrict - recheck on 5/27- improving cont fluid restriction , renal status stable    6/3 stable , mild hyponatremia , asymptomatic     Latest Ref Rng & Units 07/13/2022    6:40 AM 07/06/2022    7:03 AM 06/29/2022    5:58 AM  BMP  Glucose 70 - 99 mg/dL 409  811  914   BUN 8 - 23 mg/dL 16  13  16    Creatinine 0.61 - 1.24 mg/dL 7.82  9.56  2.13   Sodium 135 - 145 mmol/L 133  131  132   Potassium 3.5 - 5.1 mmol/L 3.9  3.8  3.9   Chloride 98 - 111 mmol/L 101  100  101   CO2 22 - 32 mmol/L 22  22  24    Calcium 8.9 - 10.3 mg/dL 9.1  9.0  8.7     11: History of gout: restart home colchicine   12: DM type II: HGB A1C 6.7, CBGs QID and SSI; carb  modified diet when advanced (home meds include metformin 1000 mg BID) CBG (last 3)  Recent Labs    07/12/22 1642 07/12/22 2059 07/13/22 0559  GLUCAP 124* 205* 131*   6/7 good control a bit low , change SSI to sensitive, no hs coverage  6/10Well controlled    13: RLE/RUE weakness:normal LE  venous duplex   14: Tobacco use: occasional cigars, advise cessation   15. Constipation             -resolved. Had bm   type 5 6/1  -only on prn senokot-s and sorbitol   - LBM 6/8 LOS: 19 days A FACE TO FACE EVALUATION WAS PERFORMED  Erick Colace 07/13/2022, 8:42 AM

## 2022-07-13 NOTE — Progress Notes (Signed)
Speech Language Pathology Daily Session Note  Patient Details  Name: Jimmy Valentine MRN: [id number] Date of Birth: 12-24-1950  Today's Date: 07/13/2022 SLP Individual Time: 1300-1345 SLP Individual Time Calculation (min): 45 min  Short Term Goals: Week 3: SLP Short Term Goal 1 (Week 3): Patient will demonstrate 90% intelligibilty at the sentence level with min A verbal cues for use of speech intelligibility strategies. SLP Short Term Goal 2 (Week 3): Patient will consume a regular diet and thin liquids without overt s/s of aspiration with supervision level verbal cues for use of swallowing compensatory strategies. SLP Short Term Goal 3 (Week 3): Pt will utilize compensatory word-finding strategies to repair communication breakdowns with >90% accuracy given min cues. SLP Short Term Goal 4 (Week 3): Pt will complete oral motor exercises to aid oral strength and coordination and improve control of secretions with min cues.  Skilled Therapeutic Interventions:  Pt was seen in PM to address speech intelligibility. Pt was alert and seated upright in WC upon SLP arrival. Pt continued to report improving speech. SLP challenged pt in structured and functional task to address speech intelligibility at the sentence and conversational task. SLP guided interaction with pt responding to questions with >90% intelligibility. Pt able to self correct errors and repeat with improved speech intelligibility as needed. NT in and out during session with pt successfully communicating needs and wants with no observed instances of communication breakdown. SLP plan to f/u x1 prior to discharge. Pt was left seated upright in WC with call button within reach and chair alarm active. SLP to continue POC.   Pain Pain Assessment Pain Score: 0-No pain  Therapy/Group: Individual Therapy  Jimmy Valentine 07/13/2022, 3:46 PM

## 2022-07-13 NOTE — Progress Notes (Signed)
Physical Therapy Session Note  Patient Details  Name: Jimmy Valentine MRN: [id number] Date of Birth: 05-25-1950  Today's Date: 07/13/2022 PT Individual Time: 0800-0858 PT Individual Time Calculation (min): 58 min   Short Term Goals: Week 3:  PT Short Term Goal 1 (Week 3): Pt will perform supine<>sit with min assist from L EOB PT Short Term Goal 2 (Week 3): Pt will perform sit<>stands using LRAD with min assist PT Short Term Goal 3 (Week 3): Pt will perform bed<>chair transfers using LRAD with min assist PT Short Term Goal 4 (Week 3): Pt will ambulate at least 66ft using LRAD with min assist PT Short Term Goal 5 (Week 3): Pt will navigate 4 steps using HRs with mod assist of 1  Skilled Therapeutic Interventions/Progress Updates:     Pt received supine in bed and agrees to therapy. Reports some pain in R hand. Number not provided. PT provides rest breaks and repositioning to manage pain. Pt performs supine to sit with bed features and cues for body mechanics and sequencing, with minA required. PT assists with upper and lower body dressing. Pt performs sit to stand from EOB with modA and cues for anterior weight shift and powering up. Pt performs stand step transfer to [facility] with modA and cues for lateral weight shifting, positioning, and hand placement. WC transport to gym. Pt performs multiple reps of sit to stand in [facility] with minA/modA and improving independence with increased reps. In standing, mirror provided for visual feedback. PT provides verbal and tactile cues for posture, increasing hip and trunk extension, and anterior weigh shift. Pt then completes targeted toe tapping with LLE to promote motor planning and WB through RLE. Pt completes x10, x20, and x10 with seated rest breaks. PT provides modA overall with tactile facilitation of Rt knee extension, as well as facilitation of Rt lateral weight shifting. Activity progressed with additional 2.5" step. Pt initially requires heavy modA to complete  but improves efficiency with increased repetitions. Pt completes x10 prior to rest break. Following rest break pt performs additional x10. WC transport back to room. Left seated with alarm intact and all needs within reach.    Therapy Documentation Precautions:  Precautions Precautions: Fall Precaution Comments: R hemi; dysarthria Restrictions Weight Bearing Restrictions: Yes   Therapy/Group: Individual Therapy  Beau Fanny, PT, DPT 07/13/2022, 4:52 PM

## 2022-07-13 NOTE — Progress Notes (Signed)
Physical Therapy Session Note  Patient Details  Name: Jimmy Valentine MRN: [id number] Date of Birth: 1950/08/08  Today's Date: 07/13/2022 PT Individual Time: 4098-1191 PT Individual Time Calculation (min): 58 min   Short Term Goals: Week 2:  PT Short Term Goal 1 (Week 2): Pt will complete bed mobility with MinA. PT Short Term Goal 1 - Progress (Week 2): Progressing toward goal PT Short Term Goal 2 (Week 2): Pt will complete bed<>chair transfers with ModA. PT Short Term Goal 2 - Progress (Week 2): Met PT Short Term Goal 3 (Week 2): pt will maintain unsupported sitting balance to intermittent CGA. PT Short Term Goal 3 - Progress (Week 2): Met PT Short Term Goal 4 (Week 2): Pt will ambulate at least 75 ft using RW with MinA. PT Short Term Goal 4 - Progress (Week 2): Progressing toward goal PT Short Term Goal 5 (Week 2): pt will initiate stair training. PT Short Term Goal 5 - Progress (Week 2): Met  Skilled Therapeutic Interventions/Progress Updates: Pt presents sitting in w/c and agreeable to therapy.  Pt wheeled to small gym for focus on NMR to R LE.  AFO donned to RLE.  Pt performed sit to stand w/ min A and then use of chair back for LUE.  Pt performed step forward and back w/ LLE only, no blocking of R knee, but available if buckles.  Pt able to flex R knee w/ LLE advancement but no buckling!  Pt performed using L hand on chair back to Left side, and w/o UE support.  PT assisting at hip.  Pt performs partial squats w/ L hand on knee 3 x 10 w/ min A at R hip.  Pt performed standing and reaching to R to pull Squig from mirror and place in container to Left.  Pt also performed same after step forward w/ LLE.  Pt returned to room and remained sitting in w/c w/ chair alarm on and all needs in reach.      Therapy Documentation Precautions:  Precautions Precautions: Fall Precaution Comments: R hemi; dysarthria Restrictions Weight Bearing Restrictions: Yes General:   Vital  Signs:  Pain:0/10, sore fingers R hand. Pain Assessment Pain Scale: 0-10 Pain Score: 0-No pain    Therapy/Group: Individual Therapy  [name] 07/13/2022, 10:49 AM

## 2022-07-13 NOTE — Progress Notes (Addendum)
Alerted by Amil Amen, OT regarding new finding of erythema over patient's right second MP joint. He has some slight tenderness to palpation. Mild hand edema that does not extend to forearm/upper arm. He has history of gout. Will start his colchicine for 3 days and monitor response. Margins of erythema inked.

## 2022-07-13 NOTE — Progress Notes (Signed)
Occupational Therapy Session Note  Patient Details  Name: Jimmy Valentine MRN: [id number] Date of Birth: [birth year]  Today's Date: 07/13/2022 OT Individual Time: 1415-1500 OT Individual Time Calculation (min): 45 min    Short Term Goals: Week 1:  OT Short Term Goal 1 (Week 1): Pt will be able to sit EOB with close S and don shirt with mod A or less. OT Short Term Goal 1 - Progress (Week 1): Met OT Short Term Goal 2 (Week 1): Pt will be able to sit to stand from EOB with max A of 1 to  prep for LB dressing. OT Short Term Goal 2 - Progress (Week 1): Met OT Short Term Goal 3 (Week 1): Pt will complete squat pivot to toilet with max A of 1. OT Short Term Goal 3 - Progress (Week 1): Met OT Short Term Goal 4 (Week 1): Pt will be able to perform gentle self ROM on RUE with min A. OT Short Term Goal 4 - Progress (Week 1): Met Week 2:  OT Short Term Goal 1 (Week 2): Pt will complete toilet transfers with min A or less. OT Short Term Goal 1 - Progress (Week 2): Met OT Short Term Goal 2 (Week 2): Pt will complete squat pivot transfers to tub bench in walk in shower with min A or less. OT Short Term Goal 2 - Progress (Week 2): Progressing toward goal OT Short Term Goal 3 (Week 2): Pt will don shirt with min A or less. OT Short Term Goal 3 - Progress (Week 2): Met OT Short Term Goal 4 (Week 2): Pt will don pants with mod A or less. OT Short Term Goal 4 - Progress (Week 2): Met OT Short Term Goal 5 (Week 2): Pt will be able to hold stand balance with min A while he uses his L hand to adjust pants over hips. OT Short Term Goal 5 - Progress (Week 2): Met Week 3:  OT Short Term Goal 1 (Week 3): STGs = LTGs  Skilled Therapeutic Interventions/Progress Updates:    Pt received in w/c ready for therapy. Pt requesting a shower.  Focus of therapy session on pt's management of RUE, sit and stand balance with self care task.  Pt stated his hand was more swollen as it had been hanging down.  Noticed pt had  redness and increased edema over 2nd and 3rd digit MP joints. Notified PA.  Started gentle retrograde massage over proximal part of hand that was not red.  Pt transferred into bathroom in    W/c.  Squat pivot to L using bar into shower and then doffed clothing in shower, showered, transferred back to wc to dress. See ADL documentation below.  Spent time this session focusing on foot placement for improved balance with standing in shower with grab bar and during dressing using bed rail.  He continues to need A with LB self care due to instability to stand dynamically without UE support, but he consistently stood with supervision while holding L bar.    He also continues to have a significant shoulder subluxation without R arm support (when sitting in shower).   After dressing pt requested to go to bed.  CGA with squat pivot to the bed and then into bed with increased A to lift legs.  Pt adjusted in bed with pillows elevating R arm and pt educated on how to do gentle self retrograde massage.  Pt resting in bed with all needs met. Alarm set and  call light in reach.    Therapy Documentation Precautions:  Precautions Precautions: Fall Precaution Comments: R hemi; dysarthria Restrictions Weight Bearing Restrictions: Yes General:   Vital Signs: Therapy Vitals Temp: 98 F (36.7 C) Temp Source: Oral Pulse Rate: 90 Resp: 17 BP: 137/73 Patient Position (if appropriate): Sitting Oxygen Therapy SpO2: 99 % O2 Device: Room Air Pain: Pain Assessment Pain Score: 0-No pain ADL: ADL Eating: Set up Grooming: Setup Upper Body Bathing: Supervision/safety (using long sponge to reach L arm) Where Assessed-Upper Body Bathing: Shower Lower Body Bathing: Minimal assistance (using long sponge to reach feet) Where Assessed-Lower Body Bathing: Shower Upper Body Dressing: Minimal cueing, Supervision/safety Where Assessed-Upper Body Dressing: Wheelchair Lower Body Dressing: Moderate assistance Where  Assessed-Lower Body Dressing: Wheelchair Toileting: Moderate assistance Where Assessed-Toileting: Toilet (elevated seat with BSC over toilet) Toilet Transfer: Minimal assistance Toilet Transfer Method: Stand pivot Toilet Transfer Equipment: Raised toilet seat, Grab bars Film/video editor: Minimal assistance Film/video editor Method: Administrator: Emergency planning/management officer, Grab bars ADL Comments: .   Therapy/Group: Individual Therapy  Livian Vanderbeck 07/13/2022, 3:36 PM

## 2022-07-14 LAB — GLUCOSE, CAPILLARY
Glucose-Capillary: 119 mg/dL — ABNORMAL HIGH (ref 70–99)
Glucose-Capillary: 140 mg/dL — ABNORMAL HIGH (ref 70–99)
Glucose-Capillary: 145 mg/dL — ABNORMAL HIGH (ref 70–99)
Glucose-Capillary: 166 mg/dL — ABNORMAL HIGH (ref 70–99)

## 2022-07-14 NOTE — Progress Notes (Addendum)
Physical Therapy Session Note  Patient Details  Name: Jimmy Valentine MRN: [id number] Date of Birth: Feb 04, 1950  Today's Date: 07/14/2022 PT Individual Time: 1103-1201 PT Individual Time Calculation (min): 58 min   Short Term Goals: Week 1:  PT Short Term Goal 1 (Week 1): Pt will complete bed mobility with modA PT Short Term Goal 1 - Progress (Week 1): Met PT Short Term Goal 2 (Week 1): Pt will complete bed<>chair transfers with maxA of 1 person PT Short Term Goal 2 - Progress (Week 1): Met PT Short Term Goal 3 (Week 1): Pt will maintain unsupported sitting balance for 30 seconds PT Short Term Goal 3 - Progress (Week 1): Met PT Short Term Goal 4 (Week 1): Pt will participate in functional outcome measure to assess falls risk PT Short Term Goal 4 - Progress (Week 1): Met Week 2:  PT Short Term Goal 1 (Week 2): Pt will complete bed mobility with MinA. PT Short Term Goal 1 - Progress (Week 2): Progressing toward goal PT Short Term Goal 2 (Week 2): Pt will complete bed<>chair transfers with ModA. PT Short Term Goal 2 - Progress (Week 2): Met PT Short Term Goal 3 (Week 2): pt will maintain unsupported sitting balance to intermittent CGA. PT Short Term Goal 3 - Progress (Week 2): Met PT Short Term Goal 4 (Week 2): Pt will ambulate at least 75 ft using RW with MinA. PT Short Term Goal 4 - Progress (Week 2): Progressing toward goal PT Short Term Goal 5 (Week 2): pt will initiate stair training. PT Short Term Goal 5 - Progress (Week 2): Met Week 3:  PT Short Term Goal 1 (Week 3): Pt will perform supine<>sit with min assist from L EOB PT Short Term Goal 2 (Week 3): Pt will perform sit<>stands using LRAD with min assist PT Short Term Goal 3 (Week 3): Pt will perform bed<>chair transfers using LRAD with min assist PT Short Term Goal 4 (Week 3): Pt will ambulate at least 98ft using LRAD with min assist PT Short Term Goal 5 (Week 3): Pt will navigate 4 steps using HRs with mod assist of 1  Skilled  Therapeutic Interventions/Progress Updates: Patient in [facility] with rehab tech in hallway at beginning of session. Patient alert and agreeable to PT session.   Patient reported R hand  potentially has gout per attending physician (redness, swelling arm slightly warm to touch noted).  Therapeutic Activity: Transfers: Pt performed sit<>stand transfers throughout session with CGA-light minA for cues to anteriorly weight shift, and to anteriorly scoot to edge of WC.   Gait Training: AFO donned R LE Pt ambulated 30' x 1 using RW with R hemi-grip, PTA on R side assisting with R LE advancement by using R hip anterior shift (minA), and minA for R LE placement during gait trial per tendency to adduct R LE during swing phase. Pt ambulated 30' x 3 with LHR outside of main gym, WC follow and PTA on R side assisting with balance, safety, and R LE placement/advancement. First trial on [facility] patient provided with minA to laterally weight shift accordingly to advance contralateral LE. PTA facilitated R hip anterior shift during swing throughout until last few feet to increase step length in order to promote reciprocal gait pattern. Patient then ambulated 2nd and 3rd trial on LHR with minA for balance, and CGA-light minA for R LE advancement. Patient noted to have decreased hip flexion on R LE, and required excessive lean to L to advance R LE. Patient  also noted to have less tendency to adduct R LE last 2 trials with PTA occasionally providing assistance for placement and advancement. Patient cued to stand upright per slight forward flexed posture.   Neuromuscular Re-ed: NMR facilitated during session with focus on R LE neuromuscular control and weight shifting. - // bar with emphasis on advancing R LE with visual target of kicking over cone. Rehab tech in front for safety, and PTA on R side assisting with R LE through swing. Patient with increased cues to lean laterally to L. Patient presented with ability to advance R LE  with less assistance, and was instructed to ambulated in // bars with light minA to assist in R foot drag per mat rubber covering on // bar floor. Ambulation on tile hallway started at this time to further progress patient.  NMR performed for improvements in motor control and coordination, balance, sequencing, judgement, and self confidence/ efficacy in performing all aspects of mobility at highest level of independence.   Patient in [facility] at end of session with brakes locked, belt alarm set, and all needs within reach.      Therapy Documentation Precautions:  Precautions Precautions: Fall Precaution Comments: R hemi; dysarthria Restrictions Weight Bearing Restrictions: Yes  Therapy/Group: Individual Therapy  Roldan Laforest PTA 07/14/2022, 12:55 PM

## 2022-07-14 NOTE — Progress Notes (Signed)
Physical Therapy Session Note  Patient Details  Name: Jimmy Valentine MRN: [id number] Date of Birth: 11/29/1950  Today's Date: 07/14/2022 PT Individual Time: 4098-1191 PT Individual Time Calculation (min): 48 min   Short Term Goals: Week 1:  PT Short Term Goal 1 (Week 1): Pt will complete bed mobility with modA PT Short Term Goal 1 - Progress (Week 1): Met PT Short Term Goal 2 (Week 1): Pt will complete bed<>chair transfers with maxA of 1 person PT Short Term Goal 2 - Progress (Week 1): Met PT Short Term Goal 3 (Week 1): Pt will maintain unsupported sitting balance for 30 seconds PT Short Term Goal 3 - Progress (Week 1): Met PT Short Term Goal 4 (Week 1): Pt will participate in functional outcome measure to assess falls risk PT Short Term Goal 4 - Progress (Week 1): Met Week 2:  PT Short Term Goal 1 (Week 2): Pt will complete bed mobility with MinA. PT Short Term Goal 1 - Progress (Week 2): Progressing toward goal PT Short Term Goal 2 (Week 2): Pt will complete bed<>chair transfers with ModA. PT Short Term Goal 2 - Progress (Week 2): Met PT Short Term Goal 3 (Week 2): pt will maintain unsupported sitting balance to intermittent CGA. PT Short Term Goal 3 - Progress (Week 2): Met PT Short Term Goal 4 (Week 2): Pt will ambulate at least 75 ft using RW with MinA. PT Short Term Goal 4 - Progress (Week 2): Progressing toward goal PT Short Term Goal 5 (Week 2): pt will initiate stair training. PT Short Term Goal 5 - Progress (Week 2): Met Week 3:  PT Short Term Goal 1 (Week 3): Pt will perform supine<>sit with min assist from L EOB PT Short Term Goal 2 (Week 3): Pt will perform sit<>stands using LRAD with min assist PT Short Term Goal 3 (Week 3): Pt will perform bed<>chair transfers using LRAD with min assist PT Short Term Goal 4 (Week 3): Pt will ambulate at least 58ft using LRAD with min assist PT Short Term Goal 5 (Week 3): Pt will navigate 4 steps using HRs with mod assist of 1  Skilled  Therapeutic Interventions/Progress Updates: Patient in [facility] with family present on entrance to room. Patient alert and agreeable to PT session.   Patient reported no pain at beginning of session. Today's session focused on functional transfers from WC<>EOB with the daughter present, (daughter did not physically participated 2/2 time, but was provided with education an instructions on proper technique and safety) and PT assisting throughout. Patient presented concerns about not wanting family to provide as much assistance, and wants to be as independent as possible. Patient was educated and consoled that even though progress is being made, there must be safety measures set in place prior to discharge for patient to enter home and to perform ADL's throughout the day safely.   Therapeutic Activity: Bed Mobility: Pt performed supine<>sit on EOB (pt doing so on L side of bed this time vs R side). Patient cued to hook L LE under R LE in order to assist self in elevating LE's onto bed with cues to use truncal momentum during descent in order to swing B LE's over EOB with minA provided to do so. Patient then performed supine to sit EOB with cues to hook R LE under L LE for proper positioning to advance them off bed with minA provided (including set up), and cues to use L UE in order to push self up to EOB (  minA for truncal elevation with use of HOB handrail). Transfers: Pt performed squat pivot transfer from WC to EOB (to L) with min-modA for set up recall (questioning cues required for head/hip relationship). Patient then required minA to transfer from EOB to [facility] (to the R). With minA for patient to demonstrate head/hip relationship, and to laterally scoot towards WC with trunk leaning in opposite direction to further enforce importance of technique. Patient then required light minA to transfer.  Gait Training: Stair Navigation: Provided visual demonstration of sequencing stair navigation using step-to pattern while  providing education on reason pt needs bilateral HRs to allow L UE support in both directions. Donned leg loop to provide improved ability to manage R LE placement during stairs.   Stair navigation training ascending/descending 4 steps x2 (seated break) (6" height) using L UE support on each HR with mod assist of 1 and +2 providing close supervision for safety and 1x min assist when pt's R foot slide out from underneath his BOS during descent on 1st trial. - During ascent: pt able to step L LE up without assist with good R knee control; however, requires total assist to lift R LE up onto the step with pt having impaired motor planning keeping R LE extended - During descent: when assisting with bringing pt's R LE down onto next step his leg quickly went from partially flexed posture to quickly moving into knee extension causing his foot to slide forward off of the next step down - therapist adjusted hand position to be on the front of the tibia to decrease this from happening in subsequent steps.  Therapist educated family that based on pt's current presentation with the stairs the formal recommendation is for pt to have ramped entrance at D/C.  Patient in [facility] at end of session with brakes locked, family present, and all needs within reach.      Therapy Documentation Precautions:  Precautions Precautions: Fall Precaution Comments: R hemi; dysarthria Restrictions Weight Bearing Restrictions: Yes  Therapy/Group: Individual Therapy  Rodriguez Aguinaldo PTA 07/14/2022, 4:04 PM

## 2022-07-14 NOTE — Progress Notes (Signed)
PROGRESS NOTE   Subjective/Complaints:  No pain in left hand , pt states he usually gets gout flareups in Right wrist and  both ankles a couple times a year  Labs reviewed  ROS: Patient denies CP, SOB, N/V/D  Objective:   No results found. Recent Labs    07/13/22 0640  WBC 11.7*  HGB 14.9  HCT 42.4  PLT 291     Recent Labs    07/13/22 0640  NA 133*  K 3.9  CL 101  CO2 22  GLUCOSE 136*  BUN 16  CREATININE 0.89  CALCIUM 9.1      Intake/Output Summary (Last 24 hours) at 07/14/2022 1610 Last data filed at 07/14/2022 0526 Gross per 24 hour  Intake 300 ml  Output 825 ml  Net -525 ml         Physical Exam: Vital Signs Blood pressure (!) 154/74, pulse 75, temperature 97.7 F (36.5 C), temperature source Oral, resp. rate 18, height 5\' 9"  (1.753 m), weight 97.5 kg, SpO2 93 %.   General: No acute distress.  Laying in bed.  Family at bedside. Mood and affect are appropriate Heart: Regular rate and rhythm no rubs murmurs or extra sounds Lungs: Clear to auscultation, breathing unlabored, no rales or wheezes Abdomen: Positive bowel sounds, soft nontender to palpation, nondistended Extremities: No clubbing, cyanosis, or edema. + R middle DIP amputation   Skin: No evidence of breakdown, no evidence of rash Neurologic: No apparent word finding deficits. 0/5 right UE. 3-/5 hip flexor, 3- knee extensors, 1/5 ankle dorsiflexor and 2- plantar flexor. Extensor tone RLE with PROM still present  Sensory exam normal sensation to light touch  in bilateral upper and lower extremities Cerebellar exam weakness on right precludes testing  Musculoskeletal: Full range of motion in all 4 extremities. Mild erythema and jt swelling RIght index MCP with TTP , no wrist synovitis or pain with ROM, no swelling in ankles or pain with ankle ROM   Assessment/Plan: 1. Functional deficits which require 3+ hours per day of  interdisciplinary therapy in a comprehensive inpatient rehab setting. Physiatrist is providing close team supervision and 24 hour management of active medical problems listed below. Physiatrist and rehab team continue to assess barriers to discharge/monitor patient progress toward functional and medical goals  Care Tool:  Bathing    Body parts bathed by patient: Abdomen, Chest, Right arm, Front perineal area, Right upper leg, Left upper leg, Face, Left arm, Left lower leg, Right lower leg   Body parts bathed by helper: Left arm, Buttocks, Right lower leg, Left lower leg     Bathing assist Assist Level: Minimal Assistance - Patient > 75%     Upper Body Dressing/Undressing Upper body dressing   What is the patient wearing?: Pull over shirt    Upper body assist Assist Level: Supervision/Verbal cueing    Lower Body Dressing/Undressing Lower body dressing      What is the patient wearing?: Incontinence brief, Pants     Lower body assist Assist for lower body dressing: Moderate Assistance - Patient 50 - 74%     Toileting Toileting    Toileting assist Assist for toileting: Moderate Assistance - Patient  50 - 74%     Transfers Chair/bed transfer  Transfers assist  Chair/bed transfer activity did not occur: Safety/medical concerns  Chair/bed transfer assist level: Minimal Assistance - Patient > 75%     Locomotion Ambulation   Ambulation assist   Ambulation activity did not occur: Safety/medical concerns  Assist level: Moderate Assistance - Patient 50 - 74%   Max distance: 70   Walk 10 feet activity   Assist  Walk 10 feet activity did not occur: Safety/medical concerns  Assist level: Moderate Assistance - Patient - 50 - 74% Assistive device: Walker-rolling (R hemi-grip)   Walk 50 feet activity   Assist Walk 50 feet with 2 turns activity did not occur: Safety/medical concerns         Walk 150 feet activity   Assist Walk 150 feet activity did not  occur: Safety/medical concerns         Walk 10 feet on uneven surface  activity   Assist Walk 10 feet on uneven surfaces activity did not occur: Safety/medical concerns         Wheelchair     Assist Is the patient using a wheelchair?: Yes Type of Wheelchair: Manual    Wheelchair assist level: Minimal Assistance - Patient > 75% Max wheelchair distance: 50 ft    Wheelchair 50 feet with 2 turns activity    Assist        Assist Level: Dependent - Patient 0%   Wheelchair 150 feet activity     Assist      Assist Level: Dependent - Patient 0%   Blood pressure (!) 154/74, pulse 75, temperature 97.7 F (36.5 C), temperature source Oral, resp. rate 18, height 5\' 9"  (1.753 m), weight 97.5 kg, SpO2 93 %.  Medical Problem List and Plan: 1. 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 Large basal ganglia infarct. 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              -patient may  shower             -ELOS/Goals: 6/20             -Continue WHO and PRAFO             -Continue CIR therapies including PT, OT, and SLP     2.  Antithrombotics: -DVT/anticoagulation:  Pharmaceutical: Lovenox 4omg daily              -antiplatelet therapy: Aspirin and Plavix for three months followed by aspirin alone (started 5/20)   3. Pain Management: Tylenol, Robaxin as needed   4. Mood/Behavior/Sleep: LCSW to evaluate and provide emotional support             -antipsychotic agents: n/a             -trazodone as needed   5. Neuropsych/cognition: This patient is capable of making decisions on his own behalf.   6. Skin/Wound Care: Routine skin care checks   7. Fluids/Electrolytes/Nutrition: Routine Is and Os and follow-up chemistries             -continue dys3/advanced to thin liquids; SLP following   8: Hypertension: monitor TID and  prn (home meds: amlodipine 10 mg QD, valsartan 320 mg daily)             -continue irbesartan 37.5 mg daily Vitals:   07/14/22 0525 07/14/22 0800  BP: 132/72 Marland Kitchen)  154/74  Pulse: 75   Resp: 18   Temp: 97.7 F (36.5 C)   SpO2: 93%    -6/2   irbesartan increased to 75mg , BP now controlled 6/7 ` - 6/10 fair control   9: Hyperlipidemia: continue high intensity statin, change simvastatin to atorvastatin 80mg  daily   10: Hyponatremia, mild Na 132: follow-up BMP, will fluid restrict - recheck on 5/27- improving cont fluid restriction , renal status stable    6/3 stable , mild hyponatremia , asymptomatic     Latest Ref Rng & Units 07/13/2022    6:40 AM 07/06/2022    7:03 AM 06/29/2022    5:58 AM  BMP  Glucose 70 - 99 mg/dL 130  865  784   BUN 8 - 23 mg/dL 16  13  16    Creatinine 0.61 - 1.24 mg/dL 6.96  2.95  2.84   Sodium 135 - 145 mmol/L 133  131  132   Potassium 3.5 - 5.1 mmol/L 3.9  3.8  3.9   Chloride 98 - 111 mmol/L 101  100  101   CO2 22 - 32 mmol/L 22  22  24    Calcium 8.9 - 10.3 mg/dL 9.1  9.0  8.7     11: History of gout: restart home colchicine   12: DM type II: HGB A1C 6.7, CBGs QID and SSI; carb  modified diet when advanced (home meds include metformin 1000 mg BID) CBG (last 3)  Recent Labs    07/13/22 1626 07/13/22 2059 07/14/22 0607  GLUCAP 114* 182* 119*   6/7 good control a bit low , change SSI to sensitive, no hs coverage  6/10Well controlled    13: RLE/RUE weakness:normal LE  venous duplex   14: Tobacco use: occasional cigars, advise cessation   15. Constipation             -resolved. Had bm   type 5 6/1  -only on prn senokot-s and sorbitol   - LBM 6/8 LOS: 20 days A FACE TO FACE EVALUATION WAS PERFORMED  Erick Colace 07/14/2022, 9:27 AM

## 2022-07-14 NOTE — Plan of Care (Signed)
  Problem: RH Swallowing Goal: LTG Patient will consume least restrictive diet using compensatory strategies with assistance (SLP) Description: LTG:  Patient will consume least restrictive diet using compensatory strategies with assistance (SLP) Outcome: Completed/Met Flowsheets (Taken 07/14/2022 1650) LTG: Pt Patient will consume least restrictive diet using compensatory strategies with assistance of (SLP): Supervision Goal: LTG Patient will participate in dysphagia therapy to increase swallow function with assistance (SLP) Description: LTG:  Patient will participate in dysphagia therapy to increase swallow function with assistance (SLP) Outcome: Completed/Met Flowsheets (Taken 06/26/2022 1735) LTG: Pt will participate in dysphagia therapy to increase swallow function with assistance of (SLP): Supervision Goal: LTG Pt will demonstrate functional change in swallow as evidenced by bedside/clinical objective assessment (SLP) Description: LTG: Patient will demonstrate functional change in swallow as evidenced by bedside/clinical objective assessment (SLP) Outcome: Completed/Met Flowsheets (Taken 06/26/2022 1735) LTG: Patient will demonstrate functional change in swallow as evidenced by bedside/clinical objective assessment: Oropharyngeal swallow   Problem: RH Expression Communication Goal: LTG Patient will increase speech intelligibility (SLP) Description: LTG: Patient will increase speech intelligibility at word/phrase/conversation level with cues, % of the time (SLP) Outcome: Completed/Met Flowsheets (Taken 06/26/2022 1735) LTG: Patient will increase speech intelligibility (SLP): Supervision Level: Conversation level Goal: LTG Patient will increase word finding of common (SLP) Description: LTG:  Patient will increase word finding of common objects/daily info/abstract thoughts with cues using compensatory strategies (SLP). Outcome: Completed/Met Flowsheets (Taken 06/26/2022 1735) LTG: Patient  will increase word finding of common (SLP): Modified Independent

## 2022-07-14 NOTE — Progress Notes (Signed)
Patient ID: Jimmy Valentine, male   DOB: December 31, 1950, 72 y.o.   MRN: [id number]  Daughter to present for some family education today.

## 2022-07-14 NOTE — Progress Notes (Signed)
Occupational Therapy Session Note  Patient Details  Name: Jimmy Valentine MRN: [id number] Date of Birth: 1950/09/02  Today's Date: 07/14/2022 OT Individual Time: 0830-0930 ([phone] unattended estim) OT Individual Time Calculation (min): 60 min    Short Term Goals: Week 1:  OT Short Term Goal 1 (Week 1): Pt will be able to sit EOB with close S and don shirt with mod A or less. OT Short Term Goal 1 - Progress (Week 1): Met OT Short Term Goal 2 (Week 1): Pt will be able to sit to stand from EOB with max A of 1 to  prep for LB dressing. OT Short Term Goal 2 - Progress (Week 1): Met OT Short Term Goal 3 (Week 1): Pt will complete squat pivot to toilet with max A of 1. OT Short Term Goal 3 - Progress (Week 1): Met OT Short Term Goal 4 (Week 1): Pt will be able to perform gentle self ROM on RUE with min A. OT Short Term Goal 4 - Progress (Week 1): Met Week 2:  OT Short Term Goal 1 (Week 2): Pt will complete toilet transfers with min A or less. OT Short Term Goal 1 - Progress (Week 2): Met OT Short Term Goal 2 (Week 2): Pt will complete squat pivot transfers to tub bench in walk in shower with min A or less. OT Short Term Goal 2 - Progress (Week 2): Progressing toward goal OT Short Term Goal 3 (Week 2): Pt will don shirt with min A or less. OT Short Term Goal 3 - Progress (Week 2): Met OT Short Term Goal 4 (Week 2): Pt will don pants with mod A or less. OT Short Term Goal 4 - Progress (Week 2): Met OT Short Term Goal 5 (Week 2): Pt will be able to hold stand balance with min A while he uses his L hand to adjust pants over hips. OT Short Term Goal 5 - Progress (Week 2): Met Week 3:  OT Short Term Goal 1 (Week 3): STGs = LTGs  Skilled Therapeutic Interventions/Progress Updates:    Pt received in bed ready for therapy.  Focus of therapy session on functional mobility, management of RUE,  and standing balance.   ADL Retraining: -set up A with donning shirt with hemi techniques -max A to don  pants,  -min A to doff pants -grooming in standing at sink with CGA support with R hand in weight bearing on sink  Transfers: -bed mobility with rolling to L side with min A with pt managing RUE, sidelying to sit min A, -squat pivot to R with CGA bed to w/c  Balance: -sit to stand at sink with CGA and held stand balance with R hand placed in weight bearing for over 15 minutes while engaged in shaving   Neuromuscular Re-Education:     -a/arom of R shoulder with arm supported with pt able to active trace sh flexion -estim to R forearm with finger and wrist extensors.  Due to gout in R knuckles pt was only able to tolerate an intensity of 22 for fingers,  Estim also placed on deltoid for 28 intensity. Pt's arm positioned on tray table and pt able to tolerate for 60 min of unattended estim.   Returned an hour later to [employer].   Pt resting in w/c with all needs met. Alarm set and call light in reach.    Therapy Documentation Precautions:  Precautions Precautions: Fall Precaution Comments: R hemi; dysarthria Restrictions Weight Bearing Restrictions:  Yes    Vital Signs: Therapy Vitals Pulse Rate: 90 Resp: 18 Oxygen Therapy SpO2: 98 % O2 Device: Room Air Pain: Pain Assessment Pain Scale: 0-10 Pain Score: 0-No pain ADL: ADL Eating: Set up Grooming: Setup Upper Body Bathing: Supervision/safety (using long sponge to reach L arm) Where Assessed-Upper Body Bathing: Shower Lower Body Bathing: Minimal assistance (using long sponge to reach feet) Where Assessed-Lower Body Bathing: Shower Upper Body Dressing: Minimal cueing, Supervision/safety Where Assessed-Upper Body Dressing: Wheelchair Lower Body Dressing: Moderate assistance Where Assessed-Lower Body Dressing: Wheelchair Toileting: Moderate assistance Where Assessed-Toileting: Toilet (elevated seat with BSC over toilet) Toilet Transfer: Minimal assistance Toilet Transfer Method: Stand pivot Toilet Transfer Equipment:  Raised toilet seat, Grab bars Film/video editor: Minimal assistance Film/video editor Method: Administrator: Emergency planning/management officer, Grab bars ADL Comments: . Vision   Perception    Praxis   Balance   Exercises:   Other Treatments:     Therapy/Group: Individual Therapy  Lettie Czarnecki 07/14/2022, 12:25 PM

## 2022-07-14 NOTE — Progress Notes (Signed)
Speech Language Pathology Discharge Summary  Patient Details  Name: Jimmy Valentine MRN: [id number] Date of Birth: 1950-07-05  Date of Discharge from SLP service:July 14, 2022  Today's Date: 07/14/2022 SLP Individual Time: 1400-1445 SLP Individual Time Calculation (min): 45 min   Skilled Therapeutic Interventions:  Pt was seen in PM to address dysphagia management and speech intelligibility. Pt was alert and seated upright in WC upon SLP arrival with his dtr and other family members present. SLP facilitated family education session through introducing rationale and purpose of SLP, pt progress, and current POC. SLP provided education regarding recommendations for regular solids and thin liquids, oral phase deficits, and safe swallowing recommendations. SLP further discussed speech intelligibility and strategies of over articulation, slow rate, and separating words. Dtr asked thoughtful questions and provided insight as necessary. Education was supplemented with handouts. Pt was left seated upright in [facility] with call button within reach, family present, and chair alarm active.   Patient has met 5 of 5 long term goals.  Patient to discharge at overall Modified Independent;Supervision level.  Reasons goals not met:     Clinical Impression/Discharge Summary: Patient has made excellent gains and has met 5 out of 5 LTG's this admission due to improved expressive language, speech intelligibility, and swallowing function. Pt is currently a mod I for word finding, Sup A for speech intelligibility, and sup A for swallowing. He currently consumes regular solids and thin liquids with use of compensatory strategies. Pt is ~90% intelligible at the conversational level with ability to self correct as needed. Pt/ family education complete. Pt will remain in CIR reciving PT and OT until discharge from program. No further skilled SLP needed at this time.  Care Partner:  Caregiver Able to Provide Assistance: Yes      Recommendation:  None   Equipment: none   Reasons for discharge: Treatment goals met   Patient/Family Agrees with Progress Made and Goals Achieved: Yes    Renaee Munda 07/14/2022, 4:55 PM

## 2022-07-15 DIAGNOSIS — I639 Cerebral infarction, unspecified: Secondary | ICD-10-CM

## 2022-07-15 DIAGNOSIS — R4701 Aphasia: Secondary | ICD-10-CM

## 2022-07-15 LAB — GLUCOSE, CAPILLARY
Glucose-Capillary: 124 mg/dL — ABNORMAL HIGH (ref 70–99)
Glucose-Capillary: 107 mg/dL — ABNORMAL HIGH (ref 70–99)
Glucose-Capillary: 127 mg/dL — ABNORMAL HIGH (ref 70–99)
Glucose-Capillary: 168 mg/dL — ABNORMAL HIGH (ref 70–99)

## 2022-07-15 NOTE — Patient Care Conference (Signed)
Inpatient RehabilitationTeam Conference and Plan of Care Update Date: 07/15/2022   Time: 10:26 AM    Patient Name: Jimmy Valentine      Medical Record Number: [id number]  Date of Birth: 11/09/1950 Sex: Male         Room/Bed: 4W20C/4W20C-01 Payor Info: Payor: [employer] MEDICARE / Plan: [employer] MEDICARE HMO/PPO / Product Type: *No Product type* /    Admit Date/Time:  06/24/2022  4:57 PM  Primary Diagnosis:  Acute ischemic left MCA stroke [facility])  Hospital Problems: Principal Problem:   Acute ischemic left MCA stroke [facility])    Expected Discharge Date: Expected Discharge Date: 07/23/22  Team Members Present: Physician leading conference: Dr. Claudette Laws Social Worker Present: Lavera Guise, BSW Nurse Present: Chana Bode, RN PT Present: Casimiro Needle, PT OT Present: Primitivo Gauze, OT PPS Coordinator present : Fae Pippin, SLP     Current Status/Progress Goal Weekly Team Focus  Bowel/Bladder     Incontinent of bowel after constipation addressed, continent of bladder     Continent of bowel and bladder    Prompted toileting  Swallow/Nutrition/ Hydration   regular/ thin liquids   Sup A  tolerating regular/ thin    ADL's   set up A with UB bathing and dressing, min A LB bathing with long sponge, min A squat pivot transfers, max A stand pivot transfers with RW, min sit to stand,  CGA static stand with no UE support, min -mod A dynamic stand with using L hand to pull up pants,  improved standing control and tolerance to up to 15 minutes.  improved midline control.  Only trace return in R shoulder, subluxation.   Min A overall   Standing balance for LB self care skill, stand pivot transfers with RW for ADL transfers, estim to RUE to faciliate flaccid heavy arm, pt/family education    Mobility   Bed mobility = min-modA; squat pivot minA, sit<>stand = minA (pt can statically stand without UE support and with close supervision for safety - about 1 minute at least). Ambulation  using RW = min/modA for safety and  RLE management as pt requires to lean hevily to L in order to advance R LE through swing phase; WC mobility = supervision to minA dependent on coordinaiton of hemi technique.   currently set to MinA overall transfers & gait, supervision w/c level  Barriers: RLE hemi with R LE flexor tone preventing flexion at R knee, R UE hemi; Weekly focus: improving LOA, continued R hemibody NMRE, ambulation, balance, overall strength, stair navigation and WC consullt (potentially).    Communication   sup a at >90% intelligibility   supervision   pt/ family education    Safety/Cognition/ Behavioral Observations               Pain      Right knuckle pain; gout flare, colchicine on board    Pain < 4 with prns    Assess need for and effectiveness of pain medications  Skin     N/a            Discharge Planning:  Daughter attneding family education today. Pt discharging home with assistance from his brother and daughter.   Team Discussion: Patient is progressing post left MCA CVA; limited by motor planning deficits, but starting to advance his leg for ambulation. Patient on target to meet rehab goals: yes, currently has demonstrated little functional change with ADLs; needs cues for technique but progressing physically.    *See Care Plan  and progress notes for long and short-term goals.   Revisions to Treatment Plan:  [facility] SLP services discontinued Step training   Teaching Needs: Safety, medications, dietary modifications, transfers, etc.   Current Barriers to Discharge: Decreased caregiver support and Home enviroment access/layout  Possible Resolutions to Barriers: Family education HH follow up services; OP ideal if transport available DME: Drop arm BSC, TTB, RW, Wheelchair     Medical Summary Current Status: Patient would like more time on rehab.  Some lower extremity return on the right side but no upper extremity return, gout affecting  the right second MCP  Barriers to Discharge: Medical stability   Possible Resolutions to Barriers/Weekly Focus: 1 person assist as a goal, consider extension, treatment for gout on good going   Continued Need for Acute Rehabilitation Level of Care: The patient requires daily medical management by a physician with specialized training in physical medicine and rehabilitation for the following reasons: Direction of a multidisciplinary physical rehabilitation program to maximize functional independence : Yes Medical management of patient stability for increased activity during participation in an intensive rehabilitation regime.: Yes Analysis of laboratory values and/or radiology reports with any subsequent need for medication adjustment and/or medical intervention. : Yes   I attest that I was present, lead the team conference, and concur with the assessment and plan of the team.   Chana Bode B 07/15/2022, 1:37 PM

## 2022-07-15 NOTE — Progress Notes (Signed)
PROGRESS NOTE   Subjective/Complaints:  Right knuckle pain improved , no other jt pain, had BM yesterday   Labs reviewed  ROS: Patient denies CP, SOB, N/V/D  Objective:   No results found. Recent Labs    07/13/22 0640  WBC 11.7*  HGB 14.9  HCT 42.4  PLT 291     Recent Labs    07/13/22 0640  NA 133*  K 3.9  CL 101  CO2 22  GLUCOSE 136*  BUN 16  CREATININE 0.89  CALCIUM 9.1      Intake/Output Summary (Last 24 hours) at 07/15/2022 0946 Last data filed at 07/15/2022 0732 Gross per 24 hour  Intake 712 ml  Output 500 ml  Net 212 ml         Physical Exam: Vital Signs Blood pressure 129/81, pulse 72, temperature 97.8 F (36.6 C), temperature source Oral, resp. rate 17, height 5\' 9"  (1.753 m), weight 97.5 kg, SpO2 95 %.   General: No acute distress.  Laying in bed.  Family at bedside. Mood and affect are appropriate Heart: Regular rate and rhythm no rubs murmurs or extra sounds Lungs: Clear to auscultation, breathing unlabored, no rales or wheezes Abdomen: Positive bowel sounds, soft nontender to palpation, nondistended Extremities: No clubbing, cyanosis, or edema. + R middle DIP amputation   Skin: No evidence of breakdown, no evidence of rash Neurologic: No apparent word finding deficits. 0/5 right UE. 3-/5 hip flexor, 3- knee extensors, 1/5 ankle dorsiflexor and 2- plantar flexor. Extensor tone RLE with PROM still present  Sensory exam normal sensation to light touch  in bilateral upper and lower extremities Cerebellar exam weakness on right precludes testing  Musculoskeletal: Full range of motion in all 4 extremities. Mild erythema and jt swelling RIght index MCP with TTP , no wrist synovitis or pain with ROM, no swelling in ankles or pain with ankle ROM   Assessment/Plan: 1. Functional deficits which require 3+ hours per day of interdisciplinary therapy in a comprehensive inpatient rehab  setting. Physiatrist is providing close team supervision and 24 hour management of active medical problems listed below. Physiatrist and rehab team continue to assess barriers to discharge/monitor patient progress toward functional and medical goals  Care Tool:  Bathing    Body parts bathed by patient: Abdomen, Chest, Right arm, Front perineal area, Right upper leg, Left upper leg, Face, Left arm, Left lower leg, Right lower leg   Body parts bathed by helper: Left arm, Buttocks, Right lower leg, Left lower leg     Bathing assist Assist Level: Minimal Assistance - Patient > 75%     Upper Body Dressing/Undressing Upper body dressing   What is the patient wearing?: Pull over shirt    Upper body assist Assist Level: Supervision/Verbal cueing    Lower Body Dressing/Undressing Lower body dressing      What is the patient wearing?: Incontinence brief, Pants     Lower body assist Assist for lower body dressing: Moderate Assistance - Patient 50 - 74%     Toileting Toileting    Toileting assist Assist for toileting: Moderate Assistance - Patient 50 - 74%     Transfers Chair/bed transfer  Transfers assist  Chair/bed transfer activity did not occur: Safety/medical concerns  Chair/bed transfer assist level: Minimal Assistance - Patient > 75%     Locomotion Ambulation   Ambulation assist   Ambulation activity did not occur: Safety/medical concerns  Assist level: Moderate Assistance - Patient 50 - 74%   Max distance: 70   Walk 10 feet activity   Assist  Walk 10 feet activity did not occur: Safety/medical concerns  Assist level: Moderate Assistance - Patient - 50 - 74% Assistive device: Walker-rolling (R hemi-grip)   Walk 50 feet activity   Assist Walk 50 feet with 2 turns activity did not occur: Safety/medical concerns         Walk 150 feet activity   Assist Walk 150 feet activity did not occur: Safety/medical concerns         Walk 10 feet on  uneven surface  activity   Assist Walk 10 feet on uneven surfaces activity did not occur: Safety/medical concerns         Wheelchair     Assist Is the patient using a wheelchair?: Yes Type of Wheelchair: Manual    Wheelchair assist level: Minimal Assistance - Patient > 75% Max wheelchair distance: 50 ft    Wheelchair 50 feet with 2 turns activity    Assist        Assist Level: Dependent - Patient 0%   Wheelchair 150 feet activity     Assist      Assist Level: Dependent - Patient 0%   Blood pressure 129/81, pulse 72, temperature 97.8 F (36.6 C), temperature source Oral, resp. rate 17, height 5\' 9"  (1.753 m), weight 97.5 kg, SpO2 95 %.  Medical Problem List and Plan: 1. 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 Large basal ganglia infarct. 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              -patient may  shower             -ELOS/Goals: 6/20             -Continue WHO and PRAFO             -Continue CIR therapies including PT, OT, and SLP     2.  Antithrombotics: -DVT/anticoagulation:  Pharmaceutical: Lovenox 4omg daily              -antiplatelet therapy: Aspirin and Plavix for three months followed by aspirin alone (started 5/20)   3. Pain Management: Tylenol, Robaxin as needed   4. Mood/Behavior/Sleep: LCSW to evaluate and provide emotional support             -antipsychotic agents: n/a             -trazodone as needed   5. Neuropsych/cognition: This patient is capable of making decisions on his own behalf.   6. Skin/Wound Care: Routine skin care checks   7. Fluids/Electrolytes/Nutrition: Routine Is and Os and follow-up chemistries             -continue dys3/advanced to thin liquids; SLP following   8: Hypertension: monitor TID and prn (home meds: amlodipine 10 mg QD, valsartan 320 mg daily)              -continue irbesartan 37.5 mg daily Vitals:   07/15/22 0432 07/15/22 0935  BP: 139/69 129/81  Pulse: 72   Resp: 17   Temp: 97.8 F (36.6 C)  SpO2: 95%    -6/2   irbesartan increased to 75mg , BP now controlled 6/7 ` - 6/10 fair control   9: Hyperlipidemia: continue high intensity statin, change simvastatin to atorvastatin 80mg  daily   10: Hyponatremia, mild Na 132: follow-up BMP, will fluid restrict - recheck on 5/27- improving cont fluid restriction , renal status stable    6/3 stable , mild hyponatremia , asymptomatic     Latest Ref Rng & Units 07/13/2022    6:40 AM 07/06/2022    7:03 AM 06/29/2022    5:58 AM  BMP  Glucose 70 - 99 mg/dL 784  696  295   BUN 8 - 23 mg/dL 16  13  16    Creatinine 0.61 - 1.24 mg/dL 2.84  1.32  4.40   Sodium 135 - 145 mmol/L 133  131  132   Potassium 3.5 - 5.1 mmol/L 3.9  3.8  3.9   Chloride 98 - 111 mmol/L 101  100  101   CO2 22 - 32 mmol/L 22  22  24    Calcium 8.9 - 10.3 mg/dL 9.1  9.0  8.7     11: History of gout: restart home colchicine   12: DM type II: HGB A1C 6.7, CBGs QID and SSI; carb  modified diet when advanced (home meds include metformin 1000 mg BID) CBG (last 3)  Recent Labs    07/14/22 1713 07/14/22 2130 07/15/22 0618  GLUCAP 145* 166* 124*   6/7 good control a bit low , change SSI to sensitive, no hs coverage  6/12 controlled    13: RLE/RUE weakness:normal LE  venous duplex   14: Tobacco use: occasional cigars, advise cessation   15. Constipation             -resolved. Had bm   type 5 6/1  -only on prn senokot-s and sorbitol   - LBM 6/8 LOS: 21 days A FACE TO FACE EVALUATION WAS PERFORMED  Erick Colace 07/15/2022, 9:46 AM

## 2022-07-15 NOTE — Progress Notes (Signed)
Physical Therapy Session Note  Patient Details  Name: Jimmy Valentine MRN: [id number] Date of Birth: 1950/06/08  Today's Date: 07/15/2022 PT Individual Time: 1305-1405 PT Individual Time Calculation (min): 60 min   Short Term Goals: Week 3:  PT Short Term Goal 1 (Week 3): Pt will perform supine<>sit with min assist from L EOB PT Short Term Goal 2 (Week 3): Pt will perform sit<>stands using LRAD with min assist PT Short Term Goal 3 (Week 3): Pt will perform bed<>chair transfers using LRAD with min assist PT Short Term Goal 4 (Week 3): Pt will ambulate at least 61ft using LRAD with min assist PT Short Term Goal 5 (Week 3): Pt will navigate 4 steps using HRs with mod assist of 1  Skilled Therapeutic Interventions/Progress Updates:    Pt received sitting in w/c and agreeable to therapy session.  Transported to/from gym in w/c for time management and energy conservation.   Pt already wearing R LE [place] Walk-on PLS AFO.   Sit>stands w/c>RW with light min assist for balance when pt not powering up through R LE sufficiently to come to stand causing minor R posterior LOB - requires assist to manage placing R hand on RW orthosis while in standing.   Gait training 58ft + 28ft + 58ft + 5ft (seated breaks between) using RW with skilled min/mod assist for balance, R LE management, and AD management with initially having close +2 guarding for safety progressed to no +2 assist on final 2 walks. Pt demonstrating the following gait deviations with therapist providing the described cuing and facilitation for improvement:  - progressed to navigating through doorways during each subsequent walks for preparation for home ambulation - donned shoe cover for toe-cap after 2nd walk due to pt continuing to have difficulties advancing R LE during swing because of lack of foot clearance and decreased muscle activation - requires consistent min-mod assist to advance R LE during swing and when pt does advance with less  assist it is with compensation from hip adductors causing narrow BOS and slight R ankle instability into supination placing pt at risk for rolling his ankle, despite PLS AFO - pt with good R knee control during stance, therapist intermittently tactile cuing for R quad activation but pt also intermittently having R knee hyperextension at mid to terminal stance; however, greatest R LE stance deficit is lack of R glute activation resulting in pt keeping R pelvis anteriorly tilted  - pt continues to lack long enough L stance time to allow him to bring R LE through during swing benefiting from manual facilitation for L weight shift onto L stance   Educated pt's brother-in-law, Cletis Athens, that pt will need a new pair of 10.5 size sneakers that lace up to allow pt to don shoes with AFO. Educated that pt will need an AFO consult and one has been scheduled for next Monday at 1pm. Also, recommended that pt receive a custom PLS AFO to provide improved medial/lateral ankle support to prevent rolling as well as increased support posteriorly to decrease knee hyperextension as well as custom brace being able to be used similarly to a shoe horn to help with increased independence with donning/doffing the brace.  Also, discussed that despite pt's progression with gait training, he is not going to be an independent functional ambulator at discharge and will need a custom manual wheelchair to allow mod-I functional mobility in the home. Planning for custom manual wheelchair next week as well.  At end of session, pt left  seated in w/c with needs in reach.   Therapy Documentation Precautions:  Precautions Precautions: Fall Precaution Comments: R hemi; dysarthria Restrictions Weight Bearing Restrictions: Yes   Pain: No complaints of pain throughout session.   Therapy/Group: Individual Therapy  Ginny Forth , PT, DPT, NCS, CSRS 07/15/2022, 12:40 PM

## 2022-07-15 NOTE — Progress Notes (Signed)
Occupational Therapy Session Note  Patient Details  Name: Jimmy Valentine MRN: [id number] Date of Birth: 07-03-1950  Today's Date: 07/15/2022 OT Individual Time: 4098-1191 OT Individual Time Calculation (min): 45 min    Short Term Goals: Week 3:  OT Short Term Goal 1 (Week 3): STGs = LTGs  Skilled Therapeutic Interventions/Progress Updates:    Pt received in bed ready for therapy.  Pt requesting to toilet and shower.  Focus of therapy session on bed mobility, transfers, standing and management of RUE.   -continues to need cues with bed mobility to use his core strength vs compensatory strategies.  With cues, he can roll to L side and sit up with min A -min squat pivot to wc from elevated bed to start working towards his height of the bed -min sq pivot to tub bench with bar, bathed with min A only for buttocks while pt held stand balance with bar Pt then needed to toilet -min stand pivot with grab bar w/c to toilet  -sat to try to void and only a small amount came out.  -dressed from toilet seat - set up with shirt  - mod -max with pants - pt sitting up too high to be able to reach forward to feet  -transferred back to w/c min A with cues for correctly setting R foot to step back safely Pt resting in w/c with all needs met. Alarm set and call light in reach.         Therapy Documentation Precautions:  Precautions Precautions: Fall Precaution Comments: R hemi; dysarthria Restrictions Weight Bearing Restrictions: Yes    Vital Signs: Therapy Vitals BP: 129/81 Pain: Pain Assessment Pain Score: 0-No pain ADL: ADL Eating: Set up Grooming: Setup Upper Body Bathing: Supervision/safety (using long sponge to reach L arm) Where Assessed-Upper Body Bathing: Shower Lower Body Bathing: Minimal assistance (using long sponge to reach feet) Where Assessed-Lower Body Bathing: Shower Upper Body Dressing: Minimal cueing, Supervision/safety Where Assessed-Upper Body Dressing:  Wheelchair Lower Body Dressing: Moderate assistance Where Assessed-Lower Body Dressing: Wheelchair Toileting: Moderate assistance Where Assessed-Toileting: Toilet (elevated seat with BSC over toilet) Toilet Transfer: Minimal assistance Toilet Transfer Method: Stand pivot Toilet Transfer Equipment: Raised toilet seat, Grab bars Film/video editor: Minimal assistance Film/video editor Method: Administrator: Emergency planning/management officer, Grab bars ADL Comments: .   Therapy/Group: Individual Therapy  Jimmy Valentine 07/15/2022, 10:05 AM

## 2022-07-15 NOTE — Progress Notes (Signed)
Physical Therapy Session Note  Patient Details  Name: Jimmy Valentine MRN: [id number] Date of Birth: Feb 22, 1950  Today's Date: 07/15/2022 PT Individual Time: 1007-1103 PT Individual Time Calculation (min): 56 min   Short Term Goals: Week 3:  PT Short Term Goal 1 (Week 3): Pt will perform supine<>sit with min assist from L EOB PT Short Term Goal 2 (Week 3): Pt will perform sit<>stands using LRAD with min assist PT Short Term Goal 3 (Week 3): Pt will perform bed<>chair transfers using LRAD with min assist PT Short Term Goal 4 (Week 3): Pt will ambulate at least 71ft using LRAD with min assist PT Short Term Goal 5 (Week 3): Pt will navigate 4 steps using HRs with mod assist of 1  Skilled Therapeutic Interventions/Progress Updates: Patient in [facility] on entrance to room. Patient alert and agreeable to PT session.   Patient reported no pain this morning. R hand is not as warm as it was yesterday, and pt reports it is less painful today  Therapeutic Activity: Transfers: Pt performed sit<>stand transfers throughout session with CGA. No VC required this session.  Gait Training:  Pt ambulated 43' x 1 using LHR, then 8' x 1 with HHA to cross doorway, and required a rest break before continuing with gait trials. Patient continued using L HR with WC follow and mirror in front to boost morale (30'). Patient demonstrated ability to bring leg through with slightly more than a "step-to-pattern," and minA to weight shift laterally to L to allow clearance for R LE to swing through. Patient require increased assistance with HHA (min/modA). Patient noted to have slight R knee flexion carryover from intervention performed prior to gait. Patient also required less assistance with R hip shift during swing phase (occasionally minA to push through after fatigue).  Neuromuscular Re-ed: NMR facilitated during session with focus on increasing hip/knee flexion for gait/stair carryover. - in // bars step to 2" step with R  LE (R LE had 4lb ankle weight around ankle, and yellow theraband tied to [facility] and wrapped around R distal thigh in order to promote knee flexion - tactile cue). Patient cued to bring knee up to chest. Patient also educated that these additives will make it increasingly difficult to perform task, and the notion behind it is to carryover to "make it easier" after they are taken off. Patient demonstrated ability to perform with added resistance, but with R LE kept in extension at first. Patient then cued to drive hips forward after stepping onto step in order to facilitate R knee flexion (performed overtime after stepping onto step). After multiple reps and 2 rest breaks, patient performed same task without ankle weight or theraband and noted to have some knee flexion with stepping to step.  NMR performed for improvements in motor control and coordination, balance, sequencing, judgement, and self confidence/ efficacy in performing all aspects of mobility at highest level of independence.   Patient in [facility] at end of session with brakes locked, belt alarm set, and all needs within reach.      Therapy Documentation Precautions:  Precautions Precautions: Fall Precaution Comments: R hemi; dysarthria Restrictions Weight Bearing Restrictions: Yes  Therapy/Group: Individual Therapy  Leiani Enright PTA 07/15/2022, 3:58 PM

## 2022-07-15 NOTE — Progress Notes (Signed)
Physical Therapy Session Note  Patient Details  Name: Jimmy Valentine MRN: [id number] Date of Birth: [birth year]  Today's Date: 07/15/2022 PT Individual Time: 1118-1202 PT Individual Time Calculation (min): 44 min   Short Term Goals: Week 3:  PT Short Term Goal 1 (Week 3): Pt will perform supine<>sit with min assist from L EOB PT Short Term Goal 2 (Week 3): Pt will perform sit<>stands using LRAD with min assist PT Short Term Goal 3 (Week 3): Pt will perform bed<>chair transfers using LRAD with min assist PT Short Term Goal 4 (Week 3): Pt will ambulate at least 63ft using LRAD with min assist PT Short Term Goal 5 (Week 3): Pt will navigate 4 steps using HRs with mod assist of 1  Skilled Therapeutic Interventions/Progress Updates:    Pt presents in room in [facility], agreeable to PT. Pt denies pain. Session focused on NMR for RLE in sitting and supine to promote muscle fiber recruitment needed for functional transfers and ambulation. Pt completes squat pivot transfers bilaterally with min assist. Pt completes supine<>sit with min assist for RLE into bed and trunk to upright.  Pt transported from room to main gym for time management. Pt completes NMR in sitting, completes exercises on LLE first followed by RLE to encouragement mirror neuron activation, active assist provided for RLE with  tactile cues provided to desired muscular activation and verbal cues to encourage both concentric and eccentric phase including: Marching x10 BLE (minimal activation noted in RLE) LAQ x10 BLE Hamstring curl (heel on ball) X10 BLE (improved activation on RLE noted with increased repetitions)  Pt completes squat pivot transfer from WC to mat to L and completes sit to supine with trunk supported on wedge for comfort.  Pt completes NMR in supine, completes exercises on LLE first followed by RLE to encouragement mirror neuron activation, active assist provided for RLE with  tactile cues provided to desired muscular  activation and verbal cues to encourage both concentric and eccentric phase including: Marching x10 BLE (pt able to initiate movement on RLE minimally, therapist providing assist to prevent RLE returning to start position, with repetition pt able to manage initiating movement throughout full ROM) Hip abduction x10 BLE SAQ x10 BLE (improves with repetition) Bridges x10 (therapist manages R knee in flexion, controls concentric and eccentric phase well with verbal cueing)  Pt completes supine to sit EOM and squat pivot transfer to [facility] on R with verbal cues and assist for RLE placement. Pt transported back to room in [facility] dependently for time management. Pt remains in room in [facility] with all needs within reach, call light in place at end of session.  Therapy Documentation Precautions:  Precautions Precautions: Fall Precaution Comments: R hemi; dysarthria Restrictions Weight Bearing Restrictions: Yes    Therapy/Group: Individual Therapy  Edwin Cap PT, DPT 07/15/2022, 12:47 PM

## 2022-07-15 NOTE — Consult Note (Signed)
Neuropsychological Consultation Comprehensive Inpatient Rehab   Patient:   Jimmy Valentine   DOB:   1950-08-17  MR Number:  [id number]  Location:  [facility] [address] CENTER A [id number] [place] [address] Dept: [phone] Loc: [phone]           Date of Service:   07/15/2022  Start Time:   2 PM End Time:   3 PM  Provider/Observer:  Arley Phenix, Psy.D.       Clinical Neuropsychologist       Billing Code/Service: [phone]  Reason for Service:    Jimmy Valentine is a 72 year old male referred for neuropsychological consultation due to coping and adjustment in residual expressive language deficits/aphasia and right-sided motor deficits.  Patient with past medical history including arthritis, gout, hyperlipidemia, hypertension, sleep apnea, diabetes and idiopathic sensorineural hearing loss.  The patient had presented to [facility] emergency department on 06/21/2022 with sudden onset of right-sided weakness, dysarthria and facial drooping.  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  On 06/21/2022 there was noted acute changes with follow-up workup but there was no indication of new acute process.  Patient has continued with right-sided motor deficits and limitations to articulation and expression for expressive language capacity but no changes in receptive language.  Once therapy evaluations were completed the patient was admitted to the comprehensive inpatient rehabilitation unit due to functional motor deficits and expressive language changes.  During the clinical visit today, the patient clearly displayed significant right-sided motor deficits and dysarthria.  The patient displayed good receptive language skills and actually better than I expected  expressive language skills.  The patient has made significant improvements.  Most of the language deficits were related to articulation of motor deficits rather than clear word finding difficulties.  The patient acknowledged times of melancholy and depressive-like responses and had 2 crying responses when direct questions regarding the death of 2 previous wives and his most recent wife dying suddenly from cardiovascular event.  The patient's brother was also present during this visit and they openly talked about some of the challenges the patient will face upon discharge.  Patient reports that he feels like he is making significant improvements and is apprehensive about expected discharge 1 week from this coming Friday.  The patient is making significant improvements and I expect significant changes and progression over the next week and a half and I suspect the improvements will be greater than the patient is currently anticipating.  Patient has great concerns about needing help from other family members etc. and being dependent upon them and wants to have more independent capacity.  I addressed the issue regarding team conferences and review of different specialty areas that will happen next week to address the patient's progress, expected changes and when he would meet goals for discharge and that the final determination has not been made as to discharge and that he should not make decisions today about where he will be in roughly 9 days.  The patient reports that he continues to be motivated to actively participate in the therapeutic efforts and reports that he is feeling good about his care and has had good explanations for what all is going on medically and what are the goals and plans for therapy.  HPI for the current admission:    HPI:  Jimmy Valentine is a 73 year old male who presented to [facility] ED on 06/21/2022 with sudden onset of right-sided weakness, dysarthria and facial drooping. Code stroke initiated.  CT head showed questionable hyperdense left MCA m1 and a chronic lacunar infarct of the left corona radiata/basal ganglia. He was administered tenecteplase. 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 Carotid duplex showed bilateral stenosis 1-39%. MRI showed large MCA territory basal ganglia infarct with petechial hemorrhage with no hemorrhagic transformation or mass effect. Echo showed EF 65-70%, trivial MVR. Hgb A1c is 6.7%. LDL 91. Started for DAPT x 3 months for intracranial atherosclerosis, then aspirin monotherapy, change statin to atorvastatin and continue at discharge.    On 5/19, patient's bedside RN noted worsening right-sided weakness from her initial assessment (initially slight pronator drift, now minimal antigravity strength noted) and neurology was notified while on the unit at 08:30 AM. Assessed by Dr. Viviann Spare.   Patient's right-sided weakness noted to be fluctuating but patient was still weaker on the right than bedside RN's initial assessment.  Stat CT head order placed and did not show new acute changes.  Patient has right facial droop, withdrawals to pain on right leg. No right upper extremity movement. He is tolerating a D3 diet/nectar liquids.  Reports he feels constipated.    His PMH is significant for arthritis, ED, gout, HLD, HTN, sleep apnea, DM and idiopathic sensorineural hearing loss (left)    Niece and son at bedside. Has daughter who is supportive as well. He lives alone. Patient has not had regular BM since admission. Reports left sciatica with mild symptoms currently. Brother in law can provide assistance after discharge.   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 10/18/2014   Elevated LFTs 04/13/2014   Diabetes (HCC) 09/03/2011   Gout 09/03/2011   OSA (obstructive sleep apnea) 04/22/2011   Annual physical exam 12/04/2010   Hypertension    Hyperlipidemia    Hypogonadism male    Erectile dysfunction     Behavioral Observation/Mental Status:   Jimmy Valentine  presents as a 72 y.o.-year-old Right handed Caucasian Male who appeared his stated age. his dress was Appropriate and he was Well Groomed and his manners were Appropriate to the situation.  his participation was indicative of Appropriate and Attentive behaviors.  There were physical disabilities noted.  he displayed an appropriate level of cooperation and motivation.    Interactions:    Active Appropriate  Attention:   within normal limits and attention span and concentration were age appropriate  Memory:   within normal limits; recent and remote memory intact  Visuo-spatial:  not examined  Speech (Volume):  low  Speech:   slurred; garbled  Thought Process:  Coherent and Relevant  Coherent, Directed, and Logical  Though Content:  WNL; not suicidal and not homicidal  Orientation:   person, place, time/date, and situation  Judgment:   Good  Planning:   Fair  Affect:    Anxious  Mood:    Dysphoric  Insight:   Good  Intelligence:   normal  Psychiatric History:  No prior psychiatric history  History of Substance Use or Abuse:  No concerns of substance abuse are reported.    Family Med/Psych  History:  Family History  Problem Relation Age of Onset   Hypertension Mother    Coronary artery disease Mother        M CABG at age 38s   Alzheimer's disease Mother    Diabetes Sister    Colon cancer Neg Hx    Prostate cancer Neg Hx    Stomach cancer Neg Hx    Rectal cancer Neg Hx    Impression/DX:   Jimmy Valentine is a 72 year old male referred for neuropsychological consultation due to coping and adjustment in residual expressive language deficits/aphasia and right-sided motor deficits.  Patient with past medical history including arthritis, gout, hyperlipidemia, hypertension, sleep apnea, diabetes and idiopathic sensorineural hearing loss.  The patient had presented to [facility] emergency department on 06/21/2022 with sudden onset of right-sided weakness, dysarthria and facial drooping.  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  On 06/21/2022 there was noted acute changes with follow-up workup but there was no indication of new acute process.  Patient has continued with right-sided motor deficits and limitations to articulation and expression for expressive language capacity but no changes in receptive language.  Once therapy evaluations were completed the patient was admitted to the comprehensive inpatient rehabilitation unit due to functional motor deficits and expressive language changes.  During the clinical visit today, the patient clearly displayed significant right-sided motor deficits and dysarthria.  The patient displayed good receptive language skills and actually better than I expected expressive language skills.  The patient has made significant improvements.  Most of the language deficits were related to articulation of motor deficits rather than clear word finding difficulties.  The patient acknowledged times of  melancholy and depressive-like responses and had 2 crying responses when direct questions regarding the death of 2 previous wives and his most recent wife dying suddenly from cardiovascular event.  The patient's brother was also present during this visit and they openly talked about some of the challenges the patient will face upon discharge.  Patient reports that he feels like he is making significant improvements and is apprehensive about expected discharge 1 week from this coming Friday.  The patient is making significant improvements and I expect significant changes and progression over the next week and a half and I suspect the improvements will be greater than the patient is currently anticipating.  Patient has great concerns about needing help from other family members etc. and being dependent upon them and wants to have more independent capacity.  I addressed the issue regarding team conferences and review of different specialty areas that will happen next week to address the patient's progress, expected changes and when he would meet goals for discharge and that the final determination has not been made as to discharge and that he  should not make decisions today about where he will be in roughly 9 days.  The patient reports that he continues to be motivated to actively participate in the therapeutic efforts and reports that he is feeling good about his care and has had good explanations for what all is going on medically and what are the goals and plans for therapy.  Disposition/Plan:  Today we worked on coping and adjustment issues and gaining a greater understanding about what to expect going forward and what some of the goals for the rest of his rehab stay as well as goals post discharge.  I also addressed the issue of his previous diagnosis of obstructive sleep apnea and whether or not he should be reassessed and/or look at possible CPAP use but I will leave that to neurology/pulmonology.           Electronically Signed   _______________________ Arley Phenix, Psy.D. Clinical Neuropsychologist

## 2022-07-15 NOTE — Plan of Care (Signed)
Problem: Consults Goal: RH STROKE PATIENT EDUCATION Description: See Patient Education module for education specifics  Outcome: Progressing   Problem: RH BOWEL ELIMINATION Goal: RH STG MANAGE BOWEL WITH ASSISTANCE Description: STG Manage Bowel with min/mod Assistance. Outcome: Progressing Goal: RH STG MANAGE BOWEL W/MEDICATION W/ASSISTANCE Description: STG Manage Bowel with Medication with min Assistance. Outcome: Progressing   Problem: RH BLADDER ELIMINATION Goal: RH STG MANAGE BLADDER WITH ASSISTANCE Description: STG Manage Bladder With min Assistance Outcome: Progressing   Problem: RH SKIN INTEGRITY Goal: RH STG SKIN FREE OF INFECTION/BREAKDOWN Description: Skin will remain intact and free from breakdown with min assist  Outcome: Progressing   Problem: RH SAFETY Goal: RH STG ADHERE TO SAFETY PRECAUTIONS W/ASSISTANCE/DEVICE Description: STG Adhere to Safety Precautions With cueing Assistance/Device. Outcome: Progressing Goal: RH STG DECREASED RISK OF FALL WITH ASSISTANCE Description: STG Decreased Risk of Fall With min Assistance. Outcome: Progressing   Problem: RH COGNITION-NURSING Goal: RH STG USES MEMORY AIDS/STRATEGIES W/ASSIST TO PROBLEM SOLVE Description: STG Uses Memory Aids/Strategies With cueing Assistance to Problem Solve. Outcome: Progressing Goal: RH STG ANTICIPATES NEEDS/CALLS FOR ASSIST W/ASSIST/CUES Description: STG Anticipates Needs/Calls for Assist With min Assistance/Cues. Outcome: Progressing   Problem: RH PAIN MANAGEMENT Goal: RH STG PAIN MANAGED AT OR BELOW PT'S PAIN GOAL Description: Pain will be less than 3 with PRN medications min assist  Outcome: Progressing   Problem: RH KNOWLEDGE DEFICIT Goal: RH STG INCREASE KNOWLEDGE OF DIABETES Description: Patient/caregiver will be able to managed medications and diet modifications to improve A1C from nursing education and nursing handouts independently  Outcome: Progressing Goal: RH STG INCREASE  KNOWLEDGE OF HYPERTENSION Description: Patient/caregiver will be able to manage blood pressure medications and lifestyle changes to improve hypertension from nursing education and nursing handouts independently Outcome: Progressing Goal: RH STG INCREASE KNOWLEDGE OF DYSPHAGIA/FLUID INTAKE Description: Patient/caregiver will be able to manage appropriate diet and texture from nursing handouts, nursing education, and other resources independently  Outcome: Progressing Goal: RH STG INCREASE KNOWLEGDE OF HYPERLIPIDEMIA Description: Patient/caregiver will be able to manage cholesterol medications and diet modifications to improve cholesterol levels from nursing education and nursing handouts independently  Outcome: Progressing Goal: RH STG INCREASE KNOWLEDGE OF STROKE PROPHYLAXIS Description: Patient/caregiver will be able to manage stroke medications and reduce the risk of another stroke from nursing education and nursing handouts independently  Outcome: Progressing   Problem: Education: Goal: Knowledge of disease or condition will improve Outcome: Progressing Goal: Knowledge of secondary prevention will improve (MUST DOCUMENT ALL) Outcome: Progressing Goal: Knowledge of patient specific risk factors will improve Loraine Leriche N/A or DELETE if not current risk factor) Outcome: Progressing   Problem: Ischemic Stroke/TIA Tissue Perfusion: Goal: Complications of ischemic stroke/TIA will be minimized Outcome: Progressing   Problem: Coping: Goal: Will verbalize positive feelings about self Outcome: Progressing Goal: Will identify appropriate support needs Outcome: Progressing   Problem: Health Behavior/Discharge Planning: Goal: Ability to manage health-related needs will improve Outcome: Progressing Goal: Goals will be collaboratively established with patient/family Outcome: Progressing   Problem: Self-Care: Goal: Ability to participate in self-care as condition permits will improve Outcome:  Progressing Goal: Verbalization of feelings and concerns over difficulty with self-care will improve Outcome: Progressing Goal: Ability to communicate needs accurately will improve Outcome: Progressing   Problem: Nutrition: Goal: Risk of aspiration will decrease Outcome: Progressing Goal: Dietary intake will improve Outcome: Progressing   Problem: Education: Goal: Ability to describe self-care measures that may prevent or decrease complications (Diabetes Survival Skills Education) will improve Outcome: Progressing Goal: Individualized Educational Video(s) Outcome: Progressing  Problem: Coping: Goal: Ability to adjust to condition or change in health will improve Outcome: Progressing   Problem: Fluid Volume: Goal: Ability to maintain a balanced intake and output will improve Outcome: Progressing   Problem: Health Behavior/Discharge Planning: Goal: Ability to identify and utilize available resources and services will improve Outcome: Progressing Goal: Ability to manage health-related needs will improve Outcome: Progressing   Problem: Metabolic: Goal: Ability to maintain appropriate glucose levels will improve Outcome: Progressing   Problem: Nutritional: Goal: Maintenance of adequate nutrition will improve Outcome: Progressing Goal: Progress toward achieving an optimal weight will improve Outcome: Progressing   Problem: Skin Integrity: Goal: Risk for impaired skin integrity will decrease Outcome: Progressing   Problem: Tissue Perfusion: Goal: Adequacy of tissue perfusion will improve Outcome: Progressing   

## 2022-07-15 NOTE — Progress Notes (Signed)
Patient ID: [name], male   DOB: 03/24/1950, 72 y.o.   MRN: [id number]  Team Conference Report to Patient/Family  Team Conference discussion was reviewed with the patient and caregiver, including goals, any changes in plan of care and target discharge date.  Patient and caregiver express understanding and are in agreement.  The patient has a target discharge date of 07/23/22.   SW met with patient and called daughter via telephone. SW provided team conference updates. Daughter requesting a phone call from physician. SW informed daughter that currently it is too soon for the team to determine/justify an extension. SW informed daughter that as we get closer to the d/c date we will discuss potential of extension, recommendations and follow up.   SW discussed SNF, HH and HC options with daughter. Daughter plans to attend education this upcoming Saturday. Sw and daughter plans to discuss their plans for home with [facility] and [facility] or potential SNF placement on Monday 6/17. No additional questions or concerns. Andria Rhein 07/15/2022, 1:50 PM

## 2022-07-16 LAB — GLUCOSE, CAPILLARY
Glucose-Capillary: 130 mg/dL — ABNORMAL HIGH (ref 70–99)
Glucose-Capillary: 93 mg/dL (ref 70–99)
Glucose-Capillary: 123 mg/dL — ABNORMAL HIGH (ref 70–99)
Glucose-Capillary: 185 mg/dL — ABNORMAL HIGH (ref 70–99)

## 2022-07-16 NOTE — Plan of Care (Signed)
Problem: Consults Goal: RH STROKE PATIENT EDUCATION Description: See Patient Education module for education specifics  Outcome: Progressing   Problem: RH BOWEL ELIMINATION Goal: RH STG MANAGE BOWEL WITH ASSISTANCE Description: STG Manage Bowel with min/mod Assistance. Outcome: Progressing Goal: RH STG MANAGE BOWEL W/MEDICATION W/ASSISTANCE Description: STG Manage Bowel with Medication with min Assistance. Outcome: Progressing   Problem: RH BLADDER ELIMINATION Goal: RH STG MANAGE BLADDER WITH ASSISTANCE Description: STG Manage Bladder With min Assistance Outcome: Progressing   Problem: RH SKIN INTEGRITY Goal: RH STG SKIN FREE OF INFECTION/BREAKDOWN Description: Skin will remain intact and free from breakdown with min assist  Outcome: Progressing   Problem: RH SAFETY Goal: RH STG ADHERE TO SAFETY PRECAUTIONS W/ASSISTANCE/DEVICE Description: STG Adhere to Safety Precautions With cueing Assistance/Device. Outcome: Progressing Goal: RH STG DECREASED RISK OF FALL WITH ASSISTANCE Description: STG Decreased Risk of Fall With min Assistance. Outcome: Progressing   Problem: RH COGNITION-NURSING Goal: RH STG USES MEMORY AIDS/STRATEGIES W/ASSIST TO PROBLEM SOLVE Description: STG Uses Memory Aids/Strategies With cueing Assistance to Problem Solve. Outcome: Progressing Goal: RH STG ANTICIPATES NEEDS/CALLS FOR ASSIST W/ASSIST/CUES Description: STG Anticipates Needs/Calls for Assist With min Assistance/Cues. Outcome: Progressing   Problem: RH PAIN MANAGEMENT Goal: RH STG PAIN MANAGED AT OR BELOW PT'S PAIN GOAL Description: Pain will be less than 3 with PRN medications min assist  Outcome: Progressing   Problem: RH KNOWLEDGE DEFICIT Goal: RH STG INCREASE KNOWLEDGE OF DIABETES Description: Patient/caregiver will be able to managed medications and diet modifications to improve A1C from nursing education and nursing handouts independently  Outcome: Progressing Goal: RH STG INCREASE  KNOWLEDGE OF HYPERTENSION Description: Patient/caregiver will be able to manage blood pressure medications and lifestyle changes to improve hypertension from nursing education and nursing handouts independently Outcome: Progressing Goal: RH STG INCREASE KNOWLEDGE OF DYSPHAGIA/FLUID INTAKE Description: Patient/caregiver will be able to manage appropriate diet and texture from nursing handouts, nursing education, and other resources independently  Outcome: Progressing Goal: RH STG INCREASE KNOWLEGDE OF HYPERLIPIDEMIA Description: Patient/caregiver will be able to manage cholesterol medications and diet modifications to improve cholesterol levels from nursing education and nursing handouts independently  Outcome: Progressing Goal: RH STG INCREASE KNOWLEDGE OF STROKE PROPHYLAXIS Description: Patient/caregiver will be able to manage stroke medications and reduce the risk of another stroke from nursing education and nursing handouts independently  Outcome: Progressing   Problem: Education: Goal: Knowledge of disease or condition will improve Outcome: Progressing Goal: Knowledge of secondary prevention will improve (MUST DOCUMENT ALL) Outcome: Progressing Goal: Knowledge of patient specific risk factors will improve Loraine Leriche N/A or DELETE if not current risk factor) Outcome: Progressing   Problem: Ischemic Stroke/TIA Tissue Perfusion: Goal: Complications of ischemic stroke/TIA will be minimized Outcome: Progressing   Problem: Coping: Goal: Will verbalize positive feelings about self Outcome: Progressing Goal: Will identify appropriate support needs Outcome: Progressing   Problem: Health Behavior/Discharge Planning: Goal: Ability to manage health-related needs will improve Outcome: Progressing Goal: Goals will be collaboratively established with patient/family Outcome: Progressing   Problem: Self-Care: Goal: Ability to participate in self-care as condition permits will improve Outcome:  Progressing Goal: Verbalization of feelings and concerns over difficulty with self-care will improve Outcome: Progressing Goal: Ability to communicate needs accurately will improve Outcome: Progressing   Problem: Nutrition: Goal: Risk of aspiration will decrease Outcome: Progressing Goal: Dietary intake will improve Outcome: Progressing   Problem: Education: Goal: Ability to describe self-care measures that may prevent or decrease complications (Diabetes Survival Skills Education) will improve Outcome: Progressing Goal: Individualized Educational Video(s) Outcome: Progressing  Problem: Coping: Goal: Ability to adjust to condition or change in health will improve Outcome: Progressing   Problem: Fluid Volume: Goal: Ability to maintain a balanced intake and output will improve Outcome: Progressing   Problem: Health Behavior/Discharge Planning: Goal: Ability to identify and utilize available resources and services will improve Outcome: Progressing Goal: Ability to manage health-related needs will improve Outcome: Progressing   Problem: Metabolic: Goal: Ability to maintain appropriate glucose levels will improve Outcome: Progressing   Problem: Nutritional: Goal: Maintenance of adequate nutrition will improve Outcome: Progressing Goal: Progress toward achieving an optimal weight will improve Outcome: Progressing   Problem: Skin Integrity: Goal: Risk for impaired skin integrity will decrease Outcome: Progressing   Problem: Tissue Perfusion: Goal: Adequacy of tissue perfusion will improve Outcome: Progressing   

## 2022-07-16 NOTE — Progress Notes (Signed)
Occupational Therapy Session Note  Patient Details  Name: Jimmy Valentine MRN: [id number] Date of Birth: 10-04-1950  Today's Date: 07/16/2022 OT Individual Time: 4098-1191 OT Individual Time Calculation (min): 60 min    Short Term Goals: Week 3:  OT Short Term Goal 1 (Week 3): STGs = LTGs  Skilled Therapeutic Interventions/Progress Updates:    Pt received in bed ready for therapy.  Focus of therapy session on RW transfers and RUE NMR.   ADL Retraining: -dressing from EOB with set up for shirt, and min A for pants today. Pt needed A with R foot and then was able to get L foot in with guarding A for reaching forward, able to pull pants over hips with less than 25 % help. -oral care at sink in seated - practiced one handed techniques to for opening and closing toothpaste and applying toothpaste.   Transfers: -bed mobility with min cues for RUE arm management and min A to roll onto Left side, guiding A to push up to sit with cues for relaxed breathing - sit to stands with CGA and then pt able to actively lift shoulder 10% to place hand on splint. -pt practiced bed to w/c stand pivot to his R with RW with min - mod A to mobilize R leg without AFO on -practiced a second time w/c >< toilet with RW with AFO on with min A to mobilize R leg and cues for safe positioning   Balance: -able to hold stand with R hand in walker splint as he used Left hand to pull pants up partially and to reach hand over to fasten and unfasten velcro strap    Neuromuscular Re-Education:     -a/arom for Rt shoulder using UE ranger. Pt now has more active movement of sh flexion and ext in a 20 degree range.   Pt resting in w/c with all needs met. Alarm set and call light in reach.    Therapy Documentation Precautions:  Precautions Precautions: Fall Precaution Comments: R hemi; dysarthria Restrictions Weight Bearing Restrictions: Yes    Vital Signs: Therapy Vitals Temp: 98.1 F (36.7 C) Temp Source:  Oral Pulse Rate: 66 Resp: 20 BP: 130/87 Patient Position (if appropriate): Lying Oxygen Therapy SpO2: 95 % O2 Device: Room Air Pain:  No c/o pain  ADL: ADL Eating: Set up Grooming: Setup Upper Body Bathing: Supervision/safety (using long sponge to reach L arm) Where Assessed-Upper Body Bathing: Shower Lower Body Bathing: Minimal assistance (using long sponge to reach feet) Where Assessed-Lower Body Bathing: Shower Upper Body Dressing: Minimal cueing, Supervision/safety Where Assessed-Upper Body Dressing: Wheelchair Lower Body Dressing: Moderate assistance Where Assessed-Lower Body Dressing: Wheelchair Toileting: Moderate assistance Where Assessed-Toileting: Toilet (elevated seat with BSC over toilet) Toilet Transfer: Minimal assistance Toilet Transfer Method: Stand pivot Toilet Transfer Equipment: Raised toilet seat, Grab bars Film/video editor: Minimal assistance Film/video editor Method: Administrator: Emergency planning/management officer, Grab bars ADL Comments: .   Therapy/Group: Individual Therapy  Johnattan Strassman 07/16/2022, 8:27 AM

## 2022-07-16 NOTE — Progress Notes (Signed)
Physical Therapy Session Note  Patient Details  Name: Jimmy Valentine MRN: [id number] Date of Birth: 01-09-1951  Today's Date: 07/16/2022 PT Individual Time: 1105-1203 PT Individual Time Calculation (min): 58 min   Short Term Goals: Week 3:  PT Short Term Goal 1 (Week 3): Pt will perform supine<>sit with min assist from L EOB PT Short Term Goal 2 (Week 3): Pt will perform sit<>stands using LRAD with min assist PT Short Term Goal 3 (Week 3): Pt will perform bed<>chair transfers using LRAD with min assist PT Short Term Goal 4 (Week 3): Pt will ambulate at least 80ft using LRAD with min assist PT Short Term Goal 5 (Week 3): Pt will navigate 4 steps using HRs with mod assist of 1  Skilled Therapeutic Interventions/Progress Updates: Patient in main gym handed off from previous PT session. Patient alert and agreeable to PT session.   Patient reported no pain or complaints.  Therapeutic Activity: Bed Mobility: Pt performed sit to supine on edge of mat with min/modA to elevate R LE onto mat, and modA to perform supine to sit edge of mat (due to time as patient required to use the restroom during last intervention). Transfers: Pt performed sit<>stand throughout session with light CGA. Provided VC for anterior scoot and to use L UE to push off on WC vs on RW. Patient squat pivoted to R with minA and cues to recall to remove R arm rest. Patient squat pivoted to L with modA to get patient to room as indicated.  - Patient transported to bathroom toilet in room. Patient performed stand pivot with use of bathroom rail to L of toilet (forward flexed L UE to rail in front to assist in stabilization and pulling upwards. Patient required minA for safety, and pt noted to retro step R LE via slide to position in front of toilet. Patient stand<>sit with light minA/CGA for safety. Pt brief soiled (incontinent), and reported possible BM, but did not produce. PTA totalA to doff/donn personal shorts and brief (patient  hip flexion on R LE to place personal shorts with light minA).   Gait Training:  Pt ambulated 28' x 2 using RW with PTA on R side to facilitate further R LE advancement as needed, safety and L lateral lean cuing. R AFO and shoe cap donned. Patient minA to advance R LE (decreased clearance and slight step through pattern). Patient required modA to lean to the L to advance R LE through swing phase, and cues to perform hip flexion with knee towards RW rail in front. Shoe cap was doffed in order to increase difficulty, and to facilitate pt to perform greater hip flexion to clear rubber toe cap of shoe. Slight noticeable increase in patient ability to clear rubber cap after error but still requires modA for L lateral leaning.   Neuromuscular Re-ed: NMR facilitated during session with focus on R LE NMR. - SAQ in supine on hi/low mat with bolster under B knees, and wedge/pillow under pt torso for comfort. Patient cued to contract quads (PTA providing tactile cues) and to contract abdominals. Patient performed 1 x 10 followed by rest. Patient then performed a few reps with PTA applying yellow theraband in order to increase resistance on R LE. Patient performed task, but then requested to use the restroom and was provided with increased assistance to get to chair.  NMR performed for improvements in motor control and coordination, balance, sequencing, judgement, and self confidence/ efficacy in performing all aspects of mobility at highest  level of independence.   Patient in [facility] at end of session with brakes locked, chair alarm set, and all needs within reach.      Therapy Documentation Precautions:  Precautions Precautions: Fall Precaution Comments: R hemi; dysarthria Restrictions Weight Bearing Restrictions: Yes  Therapy/Group: Individual Therapy  Corretta Munce PTA 07/16/2022, 3:17 PM

## 2022-07-16 NOTE — Progress Notes (Signed)
Physical Therapy Session Note  Patient Details  Name: Jimmy Valentine MRN: [id number] Date of Birth: 1950/08/30  Today's Date: 07/16/2022 PT Individual Time: 7829-5621 PT Individual Time Calculation (min): 23 min   PT Individual Time: 3086-5784 PT Individual Time Calculation (min): 74 min   Short Term Goals: Week 3:  PT Short Term Goal 1 (Week 3): Pt will perform supine<>sit with min assist from L EOB PT Short Term Goal 2 (Week 3): Pt will perform sit<>stands using LRAD with min assist PT Short Term Goal 3 (Week 3): Pt will perform bed<>chair transfers using LRAD with min assist PT Short Term Goal 4 (Week 3): Pt will ambulate at least 25ft using LRAD with min assist PT Short Term Goal 5 (Week 3): Pt will navigate 4 steps using HRs with mod assist of 1  Skilled Therapeutic Interventions/Progress Updates:   AM Session: Patient received in room in [facility] and agreeable to PT visit. Pt already wearing R LE [place] Walk-on PLS AFO. Rt UE sling donned for support and pt reports Rt hand pain is much improved today. Pt dependently transported to main gym hallway for gait training. Session focused on gait training with Lt hand rail and completed 3 bouts: 1x 70', 2x 35'. Pt required min assist to facilitate Rt foot placement and abduct Rt LE and flex knee to improve Rt foot clearance on swing through. Verbal cues for upright posture and to kick Rt LE for advancement improved toe clearance. On first bout pt ambulated with Rail for ~35' then ~10' with Lt HHA and Mod assist to stabilize balance and weight shift for bil LE stepping then transitioned back to Lt hand rail for additional ~25' gait. Seated rest provided after each bout for recovery. EOS pt dependently transported into main gym and next treating therapist alerted to pt location via radio.   PM Session: Patient received in [facility] in room and agreeable to PT visit. Pt wearing R LE [place] Walk-on PLS AFO. Pt dependently rolled to main gym hall for energy  conservation. First half of session focused on gait training and pt completed multiple bouts of gait with RW (7 bouts ~20-50' with seated rest in [facility] between). Pt had limited ability to apply pressure to walker with Rt UE and 2 episodes of walker tipping Lt during first 2 bouts of gait noted. Therapist hooked walker with leg to stabilize during last 4 bouts of gait.  Min-Mod assist required throughout all bouts for Rt LE advancement with tactile cues to initiate knee flexion in swing phase and assist to prevent excessive adduction to improve BOS with Rt stepping. On Last 3 bouts of gait 4lbs weight applied to Rt LE and pt with reduced adduction and improved placement of Rt foot.  Cues to increase step length on Lt LE improved Rt hip flexor loading for increased ability to initiate Rt limb advancement. As pt's hip flexor fatigued fait distance became shorter on last 2 bouts with pt fading to max assist for Rt LE step through/swing. On final seated rest pt transported into main gym into // bars for LE strengthening. Pt completed step ups with Lt UE support and min assist for stability on Rt side. - 4" step taps with Rt LE: 1x10 reps AAROM (mod assist to facilitate Rt hip flexion and knee flexion to clear step height with toes) - 2" step taps with Rt LE: 1x10 reps AAROM (colored discs (3) placed on box and pt completed taps to specific color stated by therapist for  NMR to target step placement) - 2" step taps with Lt LE: 2x10 reps AROM (1x Rt LE buckling as fatigued but pt able to use Lt LE/ UE and min assist from therapist to stabilize)  Pt's AFO removed and pt completed Rt ankle DF/PF AROM with gentle resistance of BOSU. Rt foot placed on BOSU (ball side up) and pt completed 1x15 reps PF press with good activation noted in gastrocnemius complex. Pt able to activation DF for 1x15 reps but noted to engage full body effort extending trunk in WC to facilitate muscle recruitment.  Pt dependently transported back to  room in [facility] for time management and energy conservation. Pt requesting return to bed for rest and Min assist provided for stand pivot tranfers WC>bed with RW. Min assist required with return to supine to bring Rt LE into bed. Pt used Lt UE on head rail to assist with boost superior in bed and +2 provided to boost superior. EOS pt comfortable in bed, Alarm on and call bell within reach, and family at bedside.   Therapy Documentation Precautions:  Precautions Precautions: Fall Precaution Comments: R hemi; dysarthria Restrictions Weight Bearing Restrictions: Yes  Pain:   pt reports Rt hand pain is much improved today in AM session. With use of RW for gait pt noted increased Rt hand discomfort but still improving.   Therapy/Group: Individual Therapy   Wynn Maudlin, DPT Acute Rehabilitation Services Office [phone]  07/16/22 4:12 PM

## 2022-07-16 NOTE — Progress Notes (Signed)
PROGRESS NOTE   Subjective/Complaints:  Per PT and OT would benefit from extension to work on mobility and safety in and out of bathroom at Mod I level goals  but will need AFO consult  Appreciate neuropsych consult   Labs reviewed  ROS: Patient denies CP, SOB, N/V/D  Objective:   No results found. No results for input(s): "WBC", "HGB", "HCT", "PLT" in the last 72 hours.   No results for input(s): "NA", "K", "CL", "CO2", "GLUCOSE", "BUN", "CREATININE", "CALCIUM" in the last 72 hours.    Intake/Output Summary (Last 24 hours) at 07/16/2022 0819 Last data filed at 07/16/2022 0306 Gross per 24 hour  Intake 476 ml  Output 1425 ml  Net -949 ml         Physical Exam: Vital Signs Blood pressure 130/87, pulse 66, temperature 98.1 F (36.7 C), temperature source Oral, resp. rate 20, height 5\' 9"  (1.753 m), weight 97.1 kg, SpO2 95 %.   General: No acute distress.  Laying in bed.  Family at bedside. Mood and affect are appropriate Heart: Regular rate and rhythm no rubs murmurs or extra sounds Lungs: Clear to auscultation, breathing unlabored, no rales or wheezes Abdomen: Positive bowel sounds, soft nontender to palpation, nondistended Extremities: No clubbing, cyanosis, or edema. + R middle DIP amputation   Skin: No evidence of breakdown, no evidence of rash Neurologic: No apparent word finding deficits. 0/5 right UE. 3-/5 hip flexor, 3- knee extensors, 1/5 ankle dorsiflexor and 2- plantar flexor. Extensor tone RLE with PROM still present  Sensory exam normal sensation to light touch  in bilateral upper and lower extremities Cerebellar exam weakness on right precludes testing  Musculoskeletal: Full range of motion in all 4 extremities. Mild erythema and jt swelling RIght index MCP with TTP , no wrist synovitis or pain with ROM, no swelling in ankles or pain with ankle ROM   Assessment/Plan: 1. Functional deficits which  require 3+ hours per day of interdisciplinary therapy in a comprehensive inpatient rehab setting. Physiatrist is providing close team supervision and 24 hour management of active medical problems listed below. Physiatrist and rehab team continue to assess barriers to discharge/monitor patient progress toward functional and medical goals  Care Tool:  Bathing    Body parts bathed by patient: Abdomen, Chest, Right arm, Front perineal area, Right upper leg, Left upper leg, Face, Left arm, Left lower leg, Right lower leg   Body parts bathed by helper: Left arm, Buttocks, Right lower leg, Left lower leg     Bathing assist Assist Level: Minimal Assistance - Patient > 75%     Upper Body Dressing/Undressing Upper body dressing   What is the patient wearing?: Pull over shirt    Upper body assist Assist Level: Supervision/Verbal cueing    Lower Body Dressing/Undressing Lower body dressing      What is the patient wearing?: Incontinence brief, Pants     Lower body assist Assist for lower body dressing: Moderate Assistance - Patient 50 - 74%     Toileting Toileting    Toileting assist Assist for toileting: Moderate Assistance - Patient 50 - 74%     Transfers Chair/bed transfer  Transfers assist  Chair/bed  transfer activity did not occur: Safety/medical concerns  Chair/bed transfer assist level: Minimal Assistance - Patient > 75%     Locomotion Ambulation   Ambulation assist   Ambulation activity did not occur: Safety/medical concerns  Assist level: Moderate Assistance - Patient 50 - 74%   Max distance: 70   Walk 10 feet activity   Assist  Walk 10 feet activity did not occur: Safety/medical concerns  Assist level: Moderate Assistance - Patient - 50 - 74% Assistive device: Walker-rolling (R hemi-grip)   Walk 50 feet activity   Assist Walk 50 feet with 2 turns activity did not occur: Safety/medical concerns         Walk 150 feet activity   Assist Walk  150 feet activity did not occur: Safety/medical concerns         Walk 10 feet on uneven surface  activity   Assist Walk 10 feet on uneven surfaces activity did not occur: Safety/medical concerns         Wheelchair     Assist Is the patient using a wheelchair?: Yes Type of Wheelchair: Manual    Wheelchair assist level: Minimal Assistance - Patient > 75% Max wheelchair distance: 50 ft    Wheelchair 50 feet with 2 turns activity    Assist        Assist Level: Dependent - Patient 0%   Wheelchair 150 feet activity     Assist      Assist Level: Dependent - Patient 0%   Blood pressure 130/87, pulse 66, temperature 98.1 F (36.7 C), temperature source Oral, resp. rate 20, height 5\' 9"  (1.753 m), weight 97.1 kg, SpO2 95 %.  Medical Problem List and Plan: 1. 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 Large basal ganglia infarct. 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              -patient may  shower             -ELOS/Goals: 6/20             -Continue WHO and PRAFO             -Continue CIR therapies including PT, OT, and SLP   AFO consult  2.  Antithrombotics: -DVT/anticoagulation:  Pharmaceutical: Lovenox 4omg daily              -antiplatelet therapy: Aspirin and Plavix for three months followed by aspirin alone (started 5/20)   3. Pain Management: Tylenol, Robaxin as needed   4. Mood/Behavior/Sleep: LCSW to evaluate and provide emotional support             -antipsychotic agents: n/a             -trazodone as needed   5. Neuropsych/cognition: This patient is capable of making decisions on his own behalf.   6. Skin/Wound Care: Routine skin care checks   7. Fluids/Electrolytes/Nutrition: Routine Is and Os and follow-up chemistries             -continue dys3/advanced to thin liquids; SLP following    8: Hypertension: monitor TID and prn (home meds: amlodipine 10 mg QD, valsartan 320 mg daily)             -continue irbesartan 37.5 mg daily Vitals:   07/15/22 1956 07/16/22 0545  BP: 130/76 130/87  Pulse: 66 66  Resp: 20 20  Temp: 97.8 F (36.6 C)  98.1 F (36.7 C)  SpO2: 98% 95%   -6/2   irbesartan increased to 75mg , BP now controlled 6/7 ` - 6/13 good control    9: Hyperlipidemia: continue high intensity statin, change simvastatin to atorvastatin 80mg  daily   10: Hyponatremia, mild Na 132: follow-up BMP, will fluid restrict - recheck on 5/27- improving cont fluid restriction , renal status stable    6/3 stable , mild hyponatremia , asymptomatic     Latest Ref Rng & Units 07/13/2022    6:40 AM 07/06/2022    7:03 AM 06/29/2022    5:58 AM  BMP  Glucose 70 - 99 mg/dL 409  811  914   BUN 8 - 23 mg/dL 16  13  16    Creatinine 0.61 - 1.24 mg/dL 7.82  9.56  2.13   Sodium 135 - 145 mmol/L 133  131  132   Potassium 3.5 - 5.1 mmol/L 3.9  3.8  3.9   Chloride 98 - 111 mmol/L 101  100  101   CO2 22 - 32 mmol/L 22  22  24    Calcium 8.9 - 10.3 mg/dL 9.1  9.0  8.7     11: History of gout: restart home colchicine   12: DM type II: HGB A1C 6.7, CBGs QID and SSI; carb  modified diet when advanced (home meds include metformin 1000 mg BID) CBG (last 3)  Recent Labs    07/15/22 1704 07/15/22 2105 07/16/22 0602  GLUCAP 127* 168* 130*   6/7 good control a bit low , change SSI to sensitive, no hs coverage  6/12 controlled    13: RLE/RUE weakness:normal LE  venous duplex   14: Tobacco use: occasional cigars, advise cessation   15. Constipation             -resolved. Had bm   type 5 6/1  -only on prn senokot-s and sorbitol   - LBM 6/8 LOS: 22 days A FACE TO FACE EVALUATION WAS PERFORMED  Erick Colace 07/16/2022, 8:19 AM

## 2022-07-17 LAB — GLUCOSE, CAPILLARY
Glucose-Capillary: 126 mg/dL — ABNORMAL HIGH (ref 70–99)
Glucose-Capillary: 123 mg/dL — ABNORMAL HIGH (ref 70–99)
Glucose-Capillary: 111 mg/dL — ABNORMAL HIGH (ref 70–99)
Glucose-Capillary: 124 mg/dL — ABNORMAL HIGH (ref 70–99)

## 2022-07-17 NOTE — Progress Notes (Signed)
Occupational Therapy Session Note  Patient Details  Name: Jimmy Valentine MRN: [id number] Date of Birth: [birth year]  Today's Date: 07/17/2022 OT Individual Time: 1315-1401 OT Individual Time Calculation (min): 46 min  and Today's Date: 07/17/2022 OT Missed Time: 8 Minutes Missed Time Reason: Other (comment) (Rounding)   Short Term Goals: Week 3:  OT Short Term Goal 1 (Week 3): STGs = LTGs  Skilled Therapeutic Interventions/Progress Updates:  Pt received sitting in WC for skilled OT session with focus on RUE NMR. Pt agreeable to interventions, demonstrating overall pleasant mood. Pt with no reports of pain. OT offering intermediate rest breaks and positioning suggestions throughout session to address potential pain/fatigue and maximize participation/safety in session.   Pt misses ~8 minutes of skilled OT intervention due to rounding. Time spent coordinating care with primary OT. Pt dependent for WC transport to day room for time management. In day room, pt participates in series of RUE ROM exercises, including:  -Shoulder flexion/extension with use of washcloth -Shoulder abduction/adduction with use of washcloth -Shoulder flexion/extension, using small ball -WB through RUE to reach across midline and retrieve resistive clips  Pt stands at high-low table to perform all the above exercises, requiring no more than CGA to maintain balance. Pt completes x10-15 reps of each exercise, demonstrating increased activation with flexive movements.   Pt remained sitting in WC with NT present and all immediate needs met at end of session. Pt continues to be appropriate for skilled OT intervention to promote further functional independence.   Therapy Documentation Precautions:  Precautions Precautions: Fall Precaution Comments: R hemi; dysarthria Restrictions Weight Bearing Restrictions: Yes   Therapy/Group: Individual Therapy  Lou Cal, OTR/L, MSOT  07/17/2022, 6:24 AM

## 2022-07-17 NOTE — Progress Notes (Addendum)
Patient ID: Jimmy Valentine, male   DOB: 1950/10/17, 72 y.o.   MRN: [id number]  SW spoke with patient to discuss d/c. PT and OT feel as the patient will benefit from 1 week extension anticipating MOD I goals. Sw informed patient that currently we are waiting on determination of extension. Family education arranged for tomorrow and team will decide on extension by Monday.  Sw will inform patient and family of DME recommendations and follow up closer to d/c. No additional questions or concerns.   2:31 PM: Sw informed daughter that currently the decision has not been determined for the extension. Sw will continue to follow up with pt and daughter once determined. Daughter still plans to attend family education tomorrow. No additional questions or concerns.

## 2022-07-17 NOTE — Progress Notes (Signed)
Physical Therapy Session Note  Patient Details  Name: Jimmy Valentine MRN: [id number] Date of Birth: 07/26/1950  Today's Date: 07/17/2022 PT Individual Time: 0815-0930 PT Individual Time Calculation (min): 75 min   Short Term Goals: Week 3:  PT Short Term Goal 1 (Week 3): Pt will perform supine<>sit with min assist from L EOB PT Short Term Goal 2 (Week 3): Pt will perform sit<>stands using LRAD with min assist PT Short Term Goal 3 (Week 3): Pt will perform bed<>chair transfers using LRAD with min assist PT Short Term Goal 4 (Week 3): Pt will ambulate at least 75ft using LRAD with min assist PT Short Term Goal 5 (Week 3): Pt will navigate 4 steps using HRs with mod assist of 1  Skilled Therapeutic Interventions/Progress Updates: Patient in [facility] on entrance to room. Patient alert and agreeable to PT session.   Patient reported no pain at beginning of session. Patient dependently transferred to main gym for time management/energy conservation.   Therapeutic Activity: Transfers: Pt performed sit<>stand throughout session with RW and CGA/light minA. Provided min/mod VC for patient to use L UE push on WC arm rest vs using RW to pull self up. Patient also required VC to perform greater anterior forward lean to assist standing momentum.  Gait Training: R AFO donned in new shoes (MaxA - patient cued to press R LE into shoe with PTA approximating R knee) Pt ambulated with RW (R hemi-grip) with PTA on R side providing min/modA in maintain balance and occasionally facilitating R anterior hip shift during swing phase. 40' x 1 with 6lb ankle weight on R LE for error augmentation and shoe cap to assist in sliding. Patient noted to be able to advance with less flexion and heavier lean to L side (required increased time and effort). After that trial, pt required rest break. Afterwards, pt ambulated 37' x 1 with notable R knee flexion (no toe cap and no ankle weight). Patient with narrow BOS (although not  completely narrow) and R LE in external rotation. Patient reported being able to feel R knee in flexion vs previous sessions. Patient cued and instructed to correct R lean onto PTA hand (PTA allowed patient to lean further into lean in order to augment the error. Patient able self corrected (static>dynamic - required min/modA assistance).  Wheelchair Mobility:  Pt propelled wheelchair 240' with close supervision around main gym with obstacle course mixed in the middle of it to perform snake weave between orange cones. Pt required minA to bring attention to R side per presentation of hitting cones on R side and lightly bumping into wall with R foot on leg rest. Provided VC for patient to bring R sided awareness when approaching obstacles on R side.  Patient in [facility] at end of session with brakes locked, chair alarm set, and all needs within reach.      Therapy Documentation Precautions:  Precautions Precautions: Fall Precaution Comments: R hemi; dysarthria Restrictions Weight Bearing Restrictions: Yes  Therapy/Group: Individual Therapy  Kely Dohn PTA 07/17/2022, 12:26 PM

## 2022-07-17 NOTE — Progress Notes (Signed)
PROGRESS NOTE   Subjective/Complaints:  No issues overnite, good BM this am   Labs reviewed  ROS: Patient denies CP, SOB, N/V/D  Objective:   No results found. No results for input(s): "WBC", "HGB", "HCT", "PLT" in the last 72 hours.   No results for input(s): "NA", "K", "CL", "CO2", "GLUCOSE", "BUN", "CREATININE", "CALCIUM" in the last 72 hours.    Intake/Output Summary (Last 24 hours) at 07/17/2022 0750 Last data filed at 07/17/2022 0515 Gross per 24 hour  Intake 360 ml  Output 850 ml  Net -490 ml         Physical Exam: Vital Signs Blood pressure 131/74, pulse 73, temperature 97.7 F (36.5 C), temperature source Oral, resp. rate 18, height 5\' 9"  (1.753 m), weight 97.1 kg, SpO2 98 %.   General: No acute distress.  Laying in bed.  Family at bedside. Mood and affect are appropriate Heart: Regular rate and rhythm no rubs murmurs or extra sounds Lungs: Clear to auscultation, breathing unlabored, no rales or wheezes Abdomen: Positive bowel sounds, soft nontender to palpation, nondistended Extremities: No clubbing, cyanosis, or edema. + R middle DIP amputation   Skin: No evidence of breakdown, no evidence of rash Neurologic: No apparent word finding deficits. 0/5 right UE. 3-/5 hip flexor, 2- Hip add, 3- knee extensors, 1/5 ankle dorsiflexor and 2- plantar flexor. Sensory exam normal sensation to light touch  in bilateral upper and lower extremities Cerebellar exam weakness on right precludes testing  Musculoskeletal: Full range of motion in all 4 extremities. Mild erythema and jt swelling RIght index MCP with TTP , no wrist synovitis or pain with ROM, no swelling in ankles or pain with ankle ROM   Assessment/Plan: 1. Functional deficits which require 3+ hours per day of interdisciplinary therapy in a comprehensive inpatient rehab setting. Physiatrist is providing close team supervision and 24 hour management of  active medical problems listed below. Physiatrist and rehab team continue to assess barriers to discharge/monitor patient progress toward functional and medical goals  Care Tool:  Bathing    Body parts bathed by patient: Abdomen, Chest, Right arm, Front perineal area, Right upper leg, Left upper leg, Face, Left arm, Left lower leg, Right lower leg   Body parts bathed by helper: Left arm, Buttocks, Right lower leg, Left lower leg     Bathing assist Assist Level: Minimal Assistance - Patient > 75%     Upper Body Dressing/Undressing Upper body dressing   What is the patient wearing?: Pull over shirt    Upper body assist Assist Level: Supervision/Verbal cueing    Lower Body Dressing/Undressing Lower body dressing      What is the patient wearing?: Incontinence brief, Pants     Lower body assist Assist for lower body dressing: Moderate Assistance - Patient 50 - 74%     Toileting Toileting    Toileting assist Assist for toileting: Moderate Assistance - Patient 50 - 74%     Transfers Chair/bed transfer  Transfers assist  Chair/bed transfer activity did not occur: Safety/medical concerns  Chair/bed transfer assist level: Minimal Assistance - Patient > 75%     Locomotion Ambulation   Ambulation assist   Ambulation activity  did not occur: Safety/medical concerns  Assist level: Moderate Assistance - Patient 50 - 74%   Max distance: 70   Walk 10 feet activity   Assist  Walk 10 feet activity did not occur: Safety/medical concerns  Assist level: Moderate Assistance - Patient - 50 - 74% Assistive device: Walker-rolling (R hemi-grip)   Walk 50 feet activity   Assist Walk 50 feet with 2 turns activity did not occur: Safety/medical concerns         Walk 150 feet activity   Assist Walk 150 feet activity did not occur: Safety/medical concerns         Walk 10 feet on uneven surface  activity   Assist Walk 10 feet on uneven surfaces activity did not  occur: Safety/medical concerns         Wheelchair     Assist Is the patient using a wheelchair?: Yes Type of Wheelchair: Manual    Wheelchair assist level: Minimal Assistance - Patient > 75% Max wheelchair distance: 50 ft    Wheelchair 50 feet with 2 turns activity    Assist        Assist Level: Dependent - Patient 0%   Wheelchair 150 feet activity     Assist      Assist Level: Dependent - Patient 0%   Blood pressure 131/74, pulse 73, temperature 97.7 F (36.5 C), temperature source Oral, resp. rate 18, height 5\' 9"  (1.753 m), weight 97.1 kg, SpO2 98 %.  Medical Problem List and Plan: 1. 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 Large basal ganglia infarct. 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              -patient may  shower             -ELOS/Goals: 6/20             -Continue WHO and PRAFO             -Continue CIR therapies including PT, OT, and SLP   AFO consult  2.  Antithrombotics: -DVT/anticoagulation:  Pharmaceutical: Lovenox 4omg daily              -antiplatelet therapy: Aspirin and Plavix for three months followed by aspirin alone (started 5/20)   3. Pain Management: Tylenol, Robaxin as needed   4. Mood/Behavior/Sleep: LCSW to evaluate and provide emotional support             -antipsychotic agents: n/a             -trazodone as needed   5. Neuropsych/cognition: This patient is capable of making decisions on his own behalf.   6. Skin/Wound Care: Routine skin care checks   7. Fluids/Electrolytes/Nutrition: Routine Is and Os and follow-up chemistries             -continue dys3/advanced to thin liquids; SLP following   8: Hypertension: monitor TID and prn (home meds: amlodipine 10 mg QD, valsartan 320 mg daily)             -continue irbesartan 37.5 mg daily Vitals:   07/16/22 2048  07/17/22 0337  BP: 132/74 131/74  Pulse: 73 73  Resp: 18 18  Temp: 97.8 F (36.6 C) 97.7 F (36.5 C)  SpO2: 97% 98%   -6/2   irbesartan increased to 75mg , BP now controlled 6/7 ` - 6/14 good control    9: Hyperlipidemia: continue  high intensity statin, change simvastatin to atorvastatin 80mg  daily   10: Hyponatremia, mild Na 132: follow-up BMP, will fluid restrict - recheck on 5/27- improving cont fluid restriction , renal status stable    6/3 stable , mild hyponatremia , asymptomatic     Latest Ref Rng & Units 07/13/2022    6:40 AM 07/06/2022    7:03 AM 06/29/2022    5:58 AM  BMP  Glucose 70 - 99 mg/dL 161  096  045   BUN 8 - 23 mg/dL 16  13  16    Creatinine 0.61 - 1.24 mg/dL 4.09  8.11  9.14   Sodium 135 - 145 mmol/L 133  131  132   Potassium 3.5 - 5.1 mmol/L 3.9  3.8  3.9   Chloride 98 - 111 mmol/L 101  100  101   CO2 22 - 32 mmol/L 22  22  24    Calcium 8.9 - 10.3 mg/dL 9.1  9.0  8.7     11: History of gout: restart home colchicine   12: DM type II: HGB A1C 6.7, CBGs QID and SSI; carb  modified diet when advanced (home meds include metformin 1000 mg BID) CBG (last 3)  Recent Labs    07/16/22 1634 07/16/22 2119 07/17/22 0628  GLUCAP 123* 185* 126*   6/7 good control a bit low , change SSI to sensitive, no hs coverage  6/12 controlled    13: RLE/RUE weakness:normal LE  venous duplex   14: Tobacco use: occasional cigars, advise cessation   15. Constipation             -resolved. Had bm   type 5 6/1  -only on prn senokot-s and sorbitol   - LBM 6/8 LOS: 23 days A FACE TO FACE EVALUATION WAS PERFORMED  Erick Colace 07/17/2022, 7:50 AM

## 2022-07-17 NOTE — Plan of Care (Signed)
Problem: Consults Goal: RH STROKE PATIENT EDUCATION Description: See Patient Education module for education specifics  Outcome: Progressing   Problem: RH BOWEL ELIMINATION Goal: RH STG MANAGE BOWEL WITH ASSISTANCE Description: STG Manage Bowel with min/mod Assistance. Outcome: Progressing Goal: RH STG MANAGE BOWEL W/MEDICATION W/ASSISTANCE Description: STG Manage Bowel with Medication with min Assistance. Outcome: Progressing   Problem: RH BLADDER ELIMINATION Goal: RH STG MANAGE BLADDER WITH ASSISTANCE Description: STG Manage Bladder With min Assistance Outcome: Progressing   Problem: RH SKIN INTEGRITY Goal: RH STG SKIN FREE OF INFECTION/BREAKDOWN Description: Skin will remain intact and free from breakdown with min assist  Outcome: Progressing   Problem: RH SAFETY Goal: RH STG ADHERE TO SAFETY PRECAUTIONS W/ASSISTANCE/DEVICE Description: STG Adhere to Safety Precautions With cueing Assistance/Device. Outcome: Progressing Goal: RH STG DECREASED RISK OF FALL WITH ASSISTANCE Description: STG Decreased Risk of Fall With min Assistance. Outcome: Progressing   Problem: RH COGNITION-NURSING Goal: RH STG USES MEMORY AIDS/STRATEGIES W/ASSIST TO PROBLEM SOLVE Description: STG Uses Memory Aids/Strategies With cueing Assistance to Problem Solve. Outcome: Progressing Goal: RH STG ANTICIPATES NEEDS/CALLS FOR ASSIST W/ASSIST/CUES Description: STG Anticipates Needs/Calls for Assist With min Assistance/Cues. Outcome: Progressing   Problem: RH PAIN MANAGEMENT Goal: RH STG PAIN MANAGED AT OR BELOW PT'S PAIN GOAL Description: Pain will be less than 3 with PRN medications min assist  Outcome: Progressing   Problem: RH KNOWLEDGE DEFICIT Goal: RH STG INCREASE KNOWLEDGE OF DIABETES Description: Patient/caregiver will be able to managed medications and diet modifications to improve A1C from nursing education and nursing handouts independently  Outcome: Progressing Goal: RH STG INCREASE  KNOWLEDGE OF HYPERTENSION Description: Patient/caregiver will be able to manage blood pressure medications and lifestyle changes to improve hypertension from nursing education and nursing handouts independently Outcome: Progressing Goal: RH STG INCREASE KNOWLEDGE OF DYSPHAGIA/FLUID INTAKE Description: Patient/caregiver will be able to manage appropriate diet and texture from nursing handouts, nursing education, and other resources independently  Outcome: Progressing Goal: RH STG INCREASE KNOWLEGDE OF HYPERLIPIDEMIA Description: Patient/caregiver will be able to manage cholesterol medications and diet modifications to improve cholesterol levels from nursing education and nursing handouts independently  Outcome: Progressing Goal: RH STG INCREASE KNOWLEDGE OF STROKE PROPHYLAXIS Description: Patient/caregiver will be able to manage stroke medications and reduce the risk of another stroke from nursing education and nursing handouts independently  Outcome: Progressing   Problem: Education: Goal: Knowledge of disease or condition will improve Outcome: Progressing Goal: Knowledge of secondary prevention will improve (MUST DOCUMENT ALL) Outcome: Progressing Goal: Knowledge of patient specific risk factors will improve (Mark N/A or DELETE if not current risk factor) Outcome: Progressing   Problem: Ischemic Stroke/TIA Tissue Perfusion: Goal: Complications of ischemic stroke/TIA will be minimized Outcome: Progressing   Problem: Coping: Goal: Will verbalize positive feelings about self Outcome: Progressing Goal: Will identify appropriate support needs Outcome: Progressing   Problem: Health Behavior/Discharge Planning: Goal: Ability to manage health-related needs will improve Outcome: Progressing Goal: Goals will be collaboratively established with patient/family Outcome: Progressing   Problem: Self-Care: Goal: Ability to participate in self-care as condition permits will improve Outcome:  Progressing Goal: Verbalization of feelings and concerns over difficulty with self-care will improve Outcome: Progressing Goal: Ability to communicate needs accurately will improve Outcome: Progressing   Problem: Nutrition: Goal: Risk of aspiration will decrease Outcome: Progressing Goal: Dietary intake will improve Outcome: Progressing   Problem: Education: Goal: Ability to describe self-care measures that may prevent or decrease complications (Diabetes Survival Skills Education) will improve Outcome: Progressing Goal: Individualized Educational Video(s) Outcome: Progressing     Problem: Coping: Goal: Ability to adjust to condition or change in health will improve Outcome: Progressing   Problem: Fluid Volume: Goal: Ability to maintain a balanced intake and output will improve Outcome: Progressing   Problem: Health Behavior/Discharge Planning: Goal: Ability to identify and utilize available resources and services will improve Outcome: Progressing Goal: Ability to manage health-related needs will improve Outcome: Progressing   Problem: Metabolic: Goal: Ability to maintain appropriate glucose levels will improve Outcome: Progressing   Problem: Nutritional: Goal: Maintenance of adequate nutrition will improve Outcome: Progressing Goal: Progress toward achieving an optimal weight will improve Outcome: Progressing   Problem: Skin Integrity: Goal: Risk for impaired skin integrity will decrease Outcome: Progressing   Problem: Tissue Perfusion: Goal: Adequacy of tissue perfusion will improve Outcome: Progressing   

## 2022-07-17 NOTE — Progress Notes (Signed)
Occupational Therapy Weekly Progress Note  Patient Details  Name: Jimmy Valentine MRN: [id number] Date of Birth: April 06, 1950  Beginning of progress report period: July 10, 2022 End of progress report period: July 17, 2022  Today's Date: 07/17/2022 OT Individual Time: 4098-1191 OT Individual Time Calculation (min): 58 min    Patient has met 1 of 1 short term goals.  Pt has made great improvement with standing control and transfers with his RW.  The focus this week is continued training in these areas to ensure he can access his bathroom at home. Also progressing with proximal shoulder movement.  Patient continues to demonstrate the following deficits: decreased cardiorespiratoy endurance, abnormal tone, and decreased sitting balance, decreased standing balance, hemiplegia, and decreased balance strategies and therefore will continue to benefit from skilled OT intervention to enhance overall performance with BADL.  Patient progressing toward long term goals..  Continue plan of care.  OT Short Term Goals Week 1:  OT Short Term Goal 1 (Week 1): Pt will be able to sit EOB with close S and don shirt with mod A or less. OT Short Term Goal 1 - Progress (Week 1): Met OT Short Term Goal 2 (Week 1): Pt will be able to sit to stand from EOB with max A of 1 to  prep for LB dressing. OT Short Term Goal 2 - Progress (Week 1): Met OT Short Term Goal 3 (Week 1): Pt will complete squat pivot to toilet with max A of 1. OT Short Term Goal 3 - Progress (Week 1): Met OT Short Term Goal 4 (Week 1): Pt will be able to perform gentle self ROM on RUE with min A. OT Short Term Goal 4 - Progress (Week 1): Met Week 2:  OT Short Term Goal 1 (Week 2): Pt will complete toilet transfers with min A or less. OT Short Term Goal 1 - Progress (Week 2): Met OT Short Term Goal 2 (Week 2): Pt will complete squat pivot transfers to tub bench in walk in shower with min A or less. OT Short Term Goal 2 - Progress (Week 2):  Progressing toward goal OT Short Term Goal 3 (Week 2): Pt will don shirt with min A or less. OT Short Term Goal 3 - Progress (Week 2): Met OT Short Term Goal 4 (Week 2): Pt will don pants with mod A or less. OT Short Term Goal 4 - Progress (Week 2): Met OT Short Term Goal 5 (Week 2): Pt will be able to hold stand balance with min A while he uses his L hand to adjust pants over hips. OT Short Term Goal 5 - Progress (Week 2): Met Week 3:  OT Short Term Goal 1 (Week 3): STGs = LTGs OT Short Term Goal 1 - Progress (Week 3): Progressing toward goal Week 4:  OT Short Term Goal 1 (Week 4): Pt will be able to complete stand pivot transfers with RW w/c to toilet and back with CGA. OT Short Term Goal 2 (Week 4): Pt will be able to complete stand pivot transfers with RW w/c to shower bench and back with CGA. OT Short Term Goal 3 (Week 4): Pt will don a shirt with supervision. OT Short Term Goal 4 (Week 4): pt will be able to don R leg into pants with min A. OT Short Term Goal 5 (Week 4): Pt will be able to actively move R arm on and off his lap to demonstrate increased shoulder control .  Skilled Therapeutic Interventions/Progress  Updates:   Pt seen today for BADL training of shower and dressing with a focus on use of RW for transfers and standing balance.  Pt demonstrated a great deal of progress today as he was able to transfer with RW w/c to shower bench and back with min A and he held stand balance with CGA as he washed his bottom.  In dressing pt able to hold balance with RW with CGA to pull pants over hips.  He is able to initiate scapular elevation to lift and place hand on and off walker with 75% A.  NMR for RUE with standing at sink for weight bearing and a/arom of moving hand back and forth on sink. Pt exhibiting increased AROM of shoulder.   Pt is progress well but needs more time in rehab to reach a level where he can safely access his bathroom and be more independent in self care. Therapy team  agrees an additional week is beneficial.   Pt sitting in wc with all needs met.    Therapy Documentation Precautions:  Precautions Precautions: Fall Precaution Comments: R hemi; dysarthria Restrictions Weight Bearing Restrictions: Yes      Pain:  No c/o pain  ADL: ADL Eating: Set up Grooming: Supervision/safety Where Assessed-Grooming: Standing at sink Upper Body Bathing: Supervision/safety (using long sponge to reach L arm) Where Assessed-Upper Body Bathing: Shower Lower Body Bathing: Supervision/safety (CGA to support balance as pt washed LB) Where Assessed-Lower Body Bathing: Shower Upper Body Dressing: Minimal cueing, Supervision/safety Where Assessed-Upper Body Dressing: Wheelchair Lower Body Dressing:  (min A for pants, max for shoes with AFO) Where Assessed-Lower Body Dressing: Wheelchair Toileting: Moderate assistance Where Assessed-Toileting: Toilet (elevated seat with BSC over toilet) Toilet Transfer: Minimal assistance Toilet Transfer Method: Stand pivot Toilet Transfer Equipment: Raised toilet seat, Grab bars Film/video editor: Minimal assistance Film/video editor Method: Warden/ranger: Emergency planning/management officer, Grab bars ADL Comments: .   Therapy/Group: Individual Therapy  Kellon Chalk 07/17/2022, 1:27 PM

## 2022-07-17 NOTE — Progress Notes (Addendum)
Patient ID: Jimmy Valentine, male   DOB: 10-01-1950, 72 y.o.   MRN: [id number]  Patient extended to 6/27. Patient and daughter informed.

## 2022-07-17 NOTE — Progress Notes (Signed)
Orthopedic Tech Progress Note Patient Details:  Jimmy Valentine 1950-12-27 [id number] Called in order to [facility] for AFO consult Patient ID: [name], male   DOB: 1950/10/29, 72 y.o.   MRN: [id number]  Lovett Calender 07/17/2022, 8:32 AM

## 2022-07-18 ENCOUNTER — Other Ambulatory Visit: Payer: Self-pay | Admitting: Internal Medicine

## 2022-07-18 DIAGNOSIS — E871 Hypo-osmolality and hyponatremia: Secondary | ICD-10-CM

## 2022-07-18 LAB — GLUCOSE, CAPILLARY
Glucose-Capillary: 127 mg/dL — ABNORMAL HIGH (ref 70–99)
Glucose-Capillary: 102 mg/dL — ABNORMAL HIGH (ref 70–99)
Glucose-Capillary: 128 mg/dL — ABNORMAL HIGH (ref 70–99)
Glucose-Capillary: 150 mg/dL — ABNORMAL HIGH (ref 70–99)

## 2022-07-18 NOTE — Progress Notes (Signed)
Physical Therapy Weekly Progress Note  Patient Details  Name: Jimmy Valentine MRN: [id number] Date of Birth: 03-Sep-1950  Beginning of progress report period: Jun 25, 2022 End of progress report period: July 16, 2022  Patient has met 3 of 5 short term goals. Patient has made functional progress towards STG, and is progressing towards LTG. Patient has demonstrated improvement with supine<>sit with minA (L EOB), sit to stands CGA/light minA, and stand pivots with RW with minA. Patient has partially met ambulation goal of 69' with LRAD minA by increasing distance ambulated to 46' using RW and min/modA (modA due to fatigue and to maintain balance with RW management). Patient is also progressing towards stair navigation goal, but still requires heavy modA/maxA - further stair navigation to be implemented in treatments. Patient is still functionally limited due to R UE weakness, and R LE weakness, gait impairments and decreased dynamic balance (improving overall). Family education initiated this week, and patient has been approved to stay in inpatient rehab for another week in order for patient to access bathroom with RW (short ambulation in home environment) as bathroom is inaccessible to WC.  Patient continues to demonstrate the following deficits muscle weakness, decreased cardiorespiratoy endurance, impaired timing and sequencing, abnormal tone, unbalanced muscle activation, and decreased coordination, decreased attention to right and decreased motor planning, decreased initiation, decreased safety awareness, decreased memory, and delayed processing, and decreased standing balance, hemiplegia, and decreased balance strategies and therefore will continue to benefit from skilled PT intervention to increase functional independence with mobility.  Patient progressing toward long term goals..  Continue plan of care.  PT Short Term Goals Week 3:  PT Short Term Goal 1 (Week 3): Pt will perform supine<>sit with  min assist from L EOB PT Short Term Goal 1 - Progress (Week 3): Met PT Short Term Goal 2 (Week 3): Pt will perform sit<>stands using LRAD with min assist PT Short Term Goal 2 - Progress (Week 3): Met PT Short Term Goal 3 (Week 3): Pt will perform bed<>chair transfers using LRAD with min assist PT Short Term Goal 3 - Progress (Week 3): Met PT Short Term Goal 4 (Week 3): Pt will ambulate at least 39ft using LRAD with min assist PT Short Term Goal 4 - Progress (Week 3): Partly met (64' with min/mod A) PT Short Term Goal 5 (Week 3): Pt will navigate 4 steps using HRs with mod assist of 1 PT Short Term Goal 5 - Progress (Week 3): Progressing toward goal       Therapy Documentation Precautions:  Precautions Precautions: Fall Precaution Comments: R hemi; dysarthria Restrictions Weight Bearing Restrictions: Yes  Kamylah Manzo PTA 07/18/2022, 3:54 PM

## 2022-07-18 NOTE — Progress Notes (Addendum)
PROGRESS NOTE   Subjective/Complaints:  Denies pain.  No new concerns this AM.  Discharge plan extended to 6/27.   Labs reviewed  ROS: Patient denies CP, SOB, N/V/D, abdominal pain, cough  Objective:   No results found. No results for input(s): "WBC", "HGB", "HCT", "PLT" in the last 72 hours.   No results for input(s): "NA", "K", "CL", "CO2", "GLUCOSE", "BUN", "CREATININE", "CALCIUM" in the last 72 hours.    Intake/Output Summary (Last 24 hours) at 07/18/2022 1942 Last data filed at 07/18/2022 1255 Gross per 24 hour  Intake 480 ml  Output 1950 ml  Net -1470 ml         Physical Exam: Vital Signs Blood pressure (!) 156/79, pulse 76, temperature 97.9 F (36.6 C), temperature source Oral, resp. rate 16, height 5\' 9"  (1.753 m), weight 96 kg, SpO2 95 %.   General: No acute distress.  Laying in bed.  Family at bedside. Therapy working on fitting AFO in his shoe. Mood and affect are appropriate Heart: Regular rate and rhythm no rubs murmurs or extra sounds Lungs:CTAB, non-labored Abdomen: Positive bowel sounds, soft nontender to palpation, nondistended Extremities: No clubbing, cyanosis, or edema. + R middle DIP amputation   Skin: Warm and dry Neurologic: No apparent word finding deficits. 0/5 right UE. 3-/5 hip flexor, 2- Hip add, 3- knee extensors, 1/5 ankle dorsiflexor and 2- plantar flexor. Sensory exam normal sensation to light touch  in bilateral upper and lower extremities Cerebellar exam weakness on right precludes testing  Musculoskeletal: Full range of motion in all 4 extremities. Mild erythema and jt swelling RIght index MCP with TTP , no wrist synovitis or pain with ROM, no swelling in ankles or pain with ankle ROM   Assessment/Plan: 1. Functional deficits which require 3+ hours per day of interdisciplinary therapy in a comprehensive inpatient rehab setting. Physiatrist is providing close team supervision  and 24 hour management of active medical problems listed below. Physiatrist and rehab team continue to assess barriers to discharge/monitor patient progress toward functional and medical goals  Care Tool:  Bathing    Body parts bathed by patient: Abdomen, Chest, Right arm, Front perineal area, Right upper leg, Left upper leg, Face, Left arm, Left lower leg, Right lower leg   Body parts bathed by helper: Left arm, Buttocks, Right lower leg, Left lower leg     Bathing assist Assist Level: Minimal Assistance - Patient > 75%     Upper Body Dressing/Undressing Upper body dressing   What is the patient wearing?: Pull over shirt    Upper body assist Assist Level: Supervision/Verbal cueing    Lower Body Dressing/Undressing Lower body dressing      What is the patient wearing?: Incontinence brief, Pants     Lower body assist Assist for lower body dressing: Moderate Assistance - Patient 50 - 74%     Toileting Toileting    Toileting assist Assist for toileting: Moderate Assistance - Patient 50 - 74%     Transfers Chair/bed transfer  Transfers assist  Chair/bed transfer activity did not occur: Safety/medical concerns  Chair/bed transfer assist level: Minimal Assistance - Patient > 75%     Locomotion Ambulation  Ambulation assist   Ambulation activity did not occur: Safety/medical concerns  Assist level: Moderate Assistance - Patient 50 - 74%   Max distance: 70   Walk 10 feet activity   Assist  Walk 10 feet activity did not occur: Safety/medical concerns  Assist level: Moderate Assistance - Patient - 50 - 74% Assistive device: Walker-rolling (R hemi-grip)   Walk 50 feet activity   Assist Walk 50 feet with 2 turns activity did not occur: Safety/medical concerns         Walk 150 feet activity   Assist Walk 150 feet activity did not occur: Safety/medical concerns         Walk 10 feet on uneven surface  activity   Assist Walk 10 feet on uneven  surfaces activity did not occur: Safety/medical concerns         Wheelchair     Assist Is the patient using a wheelchair?: Yes Type of Wheelchair: Manual    Wheelchair assist level: Minimal Assistance - Patient > 75% Max wheelchair distance: 50 ft    Wheelchair 50 feet with 2 turns activity    Assist        Assist Level: Dependent - Patient 0%   Wheelchair 150 feet activity     Assist      Assist Level: Dependent - Patient 0%   Blood pressure (!) 156/79, pulse 76, temperature 97.9 F (36.6 C), temperature source Oral, resp. rate 16, height 5\' 9"  (1.753 m), weight 96 kg, SpO2 95 %.  Medical Problem List and Plan: 1. 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 Large basal ganglia infarct. 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              -patient may  shower             -ELOS/Goals: extended 6/27             -Continue WHO and PRAFO             -Continue CIR therapies including PT, OT, and SLP   -AFO ordered- working on finding shoes this will work well with   2.  Antithrombotics: -DVT/anticoagulation:  Pharmaceutical: Lovenox 4omg daily              -antiplatelet therapy: Aspirin and Plavix for three months followed by aspirin alone (started 5/20)   3. Pain Management: Tylenol, Robaxin as needed   4. Mood/Behavior/Sleep: LCSW to evaluate and provide emotional support             -antipsychotic agents: n/a             -trazodone as needed   5. Neuropsych/cognition: This patient is capable of making decisions on his own behalf.   6. Skin/Wound Care: Routine skin care checks   7. Fluids/Electrolytes/Nutrition: Routine Is and Os and follow-up chemistries             -continue dys3/advanced to thin liquids; SLP following   8: Hypertension: monitor TID and prn (home meds: amlodipine 10 mg QD, valsartan 320  mg daily)             -continue irbesartan 37.5 mg daily Vitals:   07/17/22 2103 07/18/22 0332  BP: (!) 156/79 (!) 156/79  Pulse: 66 76  Resp: 16 16  Temp: 98.3 F (36.8 C) 97.9 F (36.6 C)  SpO2: 97% 95%   -6/2  irbesartan increased to 75mg , BP now controlled 6/7 ` - 6/15 mildly elevated today, continue to monitor    9: Hyperlipidemia: continue high intensity statin, change simvastatin to atorvastatin 80mg  daily   10: Hyponatremia, mild Na 132: follow-up BMP, will fluid restrict - recheck on 5/27- improving cont fluid restriction , renal status stable    6/3 stable , mild hyponatremia , asymptomatic   6/15 last Na 6/10 up to 133, recheck monday    Latest Ref Rng & Units 07/13/2022    6:40 AM 07/06/2022    7:03 AM 06/29/2022    5:58 AM  BMP  Glucose 70 - 99 mg/dL 161  096  045   BUN 8 - 23 mg/dL 16  13  16    Creatinine 0.61 - 1.24 mg/dL 4.09  8.11  9.14   Sodium 135 - 145 mmol/L 133  131  132   Potassium 3.5 - 5.1 mmol/L 3.9  3.8  3.9   Chloride 98 - 111 mmol/L 101  100  101   CO2 22 - 32 mmol/L 22  22  24    Calcium 8.9 - 10.3 mg/dL 9.1  9.0  8.7     11: History of gout: restart home colchicine   12: DM type II: HGB A1C 6.7, CBGs QID and SSI; carb  modified diet when advanced (home meds include metformin 1000 mg BID) CBG (last 3)  Recent Labs    07/18/22 0551 07/18/22 1154 07/18/22 1644  GLUCAP 127* 102* 128*   6/7 good control a bit low , change SSI to sensitive, no hs coverage  6/12 controlled   6/15 well controlled, continue to monitor    13: RLE/RUE weakness:normal LE  venous duplex   14: Tobacco use: occasional cigars, advise cessation   15. Constipation             -resolved. Had bm   type 5 6/1  -only on prn senokot-s and sorbitol   - LBM 6/15 LOS: 24 days A FACE TO FACE EVALUATION WAS PERFORMED  Fanny Dance 07/18/2022, 7:42 PM

## 2022-07-18 NOTE — Progress Notes (Signed)
Occupational Therapy Session Note  Patient Details  Name: CHATO LAATSCH MRN: [id number] Date of Birth: 1950-03-07  Today's Date: 07/18/2022 OT Individual Time: 1300-1400 OT Individual Time Calculation (min): 60 min    Short Term Goals: Week 1:  OT Short Term Goal 1 (Week 1): Pt will be able to sit EOB with close S and don shirt with mod A or less. OT Short Term Goal 1 - Progress (Week 1): Met OT Short Term Goal 2 (Week 1): Pt will be able to sit to stand from EOB with max A of 1 to  prep for LB dressing. OT Short Term Goal 2 - Progress (Week 1): Met OT Short Term Goal 3 (Week 1): Pt will complete squat pivot to toilet with max A of 1. OT Short Term Goal 3 - Progress (Week 1): Met OT Short Term Goal 4 (Week 1): Pt will be able to perform gentle self ROM on RUE with min A. OT Short Term Goal 4 - Progress (Week 1): Met Week 2:  OT Short Term Goal 1 (Week 2): Pt will complete toilet transfers with min A or less. OT Short Term Goal 1 - Progress (Week 2): Met OT Short Term Goal 2 (Week 2): Pt will complete squat pivot transfers to tub bench in walk in shower with min A or less. OT Short Term Goal 2 - Progress (Week 2): Progressing toward goal OT Short Term Goal 3 (Week 2): Pt will don shirt with min A or less. OT Short Term Goal 3 - Progress (Week 2): Met OT Short Term Goal 4 (Week 2): Pt will don pants with mod A or less. OT Short Term Goal 4 - Progress (Week 2): Met OT Short Term Goal 5 (Week 2): Pt will be able to hold stand balance with min A while he uses his L hand to adjust pants over hips. OT Short Term Goal 5 - Progress (Week 2): Met Week 3:  OT Short Term Goal 1 (Week 3): STGs = LTGs OT Short Term Goal 1 - Progress (Week 3): Progressing toward goal Week 4:  OT Short Term Goal 1 (Week 4): Pt will be able to complete stand pivot transfers with RW w/c to toilet and back with CGA. OT Short Term Goal 2 (Week 4): Pt will be able to complete stand pivot transfers with RW w/c to shower  bench and back with CGA. OT Short Term Goal 3 (Week 4): Pt will don a shirt with supervision. OT Short Term Goal 4 (Week 4): pt will be able to don R leg into pants with min A. OT Short Term Goal 5 (Week 4): Pt will be able to actively move R arm on and off his lap to demonstrate increased shoulder control .  Skilled Therapeutic Interventions/Progress Updates:    Pt received in bed ready for therapy.  Focus of therapy session on family education with his daughter and son in law.  Prior to there arrival, pt worked on bed mobility with min A, total A to don socks and shoes.  Family arrived and were able to observe how he can do a squat/stand pivot to his L side bed to w/c with only CGA.   To practice tub transfers similar to his home set up, we went to tub room and set up bathroom to simulate his bathroom.  At entrance of door, pt worked on sit to stand to [employer] with light CGA.  He elevated shoulder to help lift it onto RW  splint.  With light min A pt stepped to his R to tub bench doing better with advancing R foot and not stepping feet together too closely.  Sit to stand to tub bench and was able to get in the tub with A to lift R leg over wall. Demonstrated a technique where he can use gait belt to help lift R foot over wall using L hand. Once in tub, practiced sit to stands with bar and pt practiced releasing L hand to simulate washing his bottom.  Transfer out of tub in similar manner. Overall transfer went extremely well and then he used RW to step pivot back to [place] with only CGA to his L side.  Discussed recommendation for tub bench, where to put grab bars, tub non slip grips, hand held shower. Because he has 11 more days of rehab, suggested they wait until next Saturday to install bars.  It is possible pt may be able to use his main bathroom with a shower.  Pt then went to gym with his family for next session,      Therapy Documentation Precautions:  Precautions Precautions: Fall Precaution  Comments: R hemi; dysarthria Restrictions Weight Bearing Restrictions: Yes  Pain: Pain Assessment Pain Score: 0-No pain ADL: ADL Eating: Set up Grooming: Supervision/safety Where Assessed-Grooming: Standing at sink Upper Body Bathing: Supervision/safety (using long sponge to reach L arm) Where Assessed-Upper Body Bathing: Shower Lower Body Bathing: Supervision/safety (CGA to support balance as pt washed LB) Where Assessed-Lower Body Bathing: Shower Upper Body Dressing: Minimal cueing, Supervision/safety Where Assessed-Upper Body Dressing: Wheelchair Lower Body Dressing:  (min A for pants, max for shoes with AFO) Where Assessed-Lower Body Dressing: Wheelchair Toileting: Moderate assistance Where Assessed-Toileting: Toilet (elevated seat with BSC over toilet) Toilet Transfer: Minimal assistance Toilet Transfer Method: Stand pivot Toilet Transfer Equipment: Raised toilet seat, Grab bars Film/video editor: Minimal assistance Film/video editor Method: Warden/ranger: Emergency planning/management officer, Grab bars ADL Comments: .   Merchandiser, retail Assessed: Yes Standardized Balance Assessment Standardized Balance Assessment: PASS Postural Assessment Scale for Stroke Patients=PASS 1. Sitting Without Support: Can sit for 5 minutes without support 2. Standing With Support: Can stand with support of only 1 hand 3. Standing Without Support: Can stand without support for 1 min or stands slightly asymmetrically 4.Standing on Nonparetic Leg: Can stand on nonparetic leg for a few seconds 5.Standing on Paretic Leg: Cannot stand on paretic leg MAINTAINING POSTURE SUBTOTAL: 9 6. Supine to Paretic Side Lateral: Can perform with little help 7. Supine to Nonparetic Side Lateral: Can perform with little help 8. Supine to Sitting Up on the Edge of the Mat: Can perform with little help 9. Sitting on the Edge of the Mat to Supine: Can perform with little help 10. Sitting  to Standing Up: Can perform without help 11. Standing Up to Sitting Down: Can perform with little help 12. Standing,Picking Up a Pencil from the Floor: Cannot perform CHANGING POSTURE SUBTOTAL: 13 PASS TOTAL SCORE: 22     Therapy/Group: Individual Therapy  Marrion Finan 07/18/2022, 4:27 PM

## 2022-07-18 NOTE — Progress Notes (Signed)
Physical Therapy Session Note  Patient Details  Name: Jimmy Valentine MRN: [id number] Date of Birth: 01-Sep-1950  Today's Date: 07/18/2022 PT Individual Time: 4098-1191 PT Individual Time Calculation (min): 72 min   Short Term Goals: Week 3:  PT Short Term Goal 1 (Week 3): Pt will perform supine<>sit with min assist from L EOB PT Short Term Goal 2 (Week 3): Pt will perform sit<>stands using LRAD with min assist PT Short Term Goal 3 (Week 3): Pt will perform bed<>chair transfers using LRAD with min assist PT Short Term Goal 4 (Week 3): Pt will ambulate at least 3ft using LRAD with min assist PT Short Term Goal 5 (Week 3): Pt will navigate 4 steps using HRs with mod assist of 1  Skilled Therapeutic Interventions/Progress Updates: Patient in main gym handed off from OT with family present. Patient alert and agreeable to PT session.   Patient reported change in status or pain. Family present for family ed. Family was educated that since patient has been approved to stay another week, that his status might change as far as how much assistance he will need. PTA covered safety basics with daughter participating in how to utilize body mechanics to assist if patient were to start leaning in one direction (daughter asked for demonstration). PTA educated on having wide BOS in proper position to use hips to drive patient back to center if required, or to counterbalance the weight if patient were to start leaning in opposite direction. Patient's family updated on functional progress in the past couple of weeks, and what we have been working on to get him towards his goal of having less assistance after d/c, but still reinforced that patient will still require assistance.   Therapeutic Activity: Bed Mobility: Pt performed sit to supine from EOB with minA to elevate B LE's with cues to hook L LE behind R LE to assist.  Transfers: Pt performed sit<>stands throughout session with CGA and mod cues to scoot  anteriorly to edge of WC, and min cues to push with L UE instead of using the RW to pull self up (family educated on proper sequence to prepare for standing). Squat pivot performed from [facility] to EOB with family educated on pt using head hip relationship for this type of transfer (to the R so pt can push off WC arm rest with L UE). Patient required mod cues to anteriorly scoot to edge of WC and to recall head/hip relationship (pt family educated on that as well as pt performs transfer with less assistance this way).  Gait Training:  Pt ambulated 40' x 2, and 20' x 1 using RW with min/modA for dynamic safety and occasional R LE advancement through facilitating L lean. Pt on first trial of 69' presented with decreased step clearance, and decreased knee flexion/hip flexion (straight legged) on R LE. Daughter followed in [facility]. Patient then rested and ambulated another 40', this time with 6lb ankle weight on R LE with shoe cap in order to increase challenge to carry over to hip flexion. Patient required modA to advance R LE (towards end of gait per fatigue report on R LE). PTA facilitating R hip anterior shift at that point. Ankle weight was taken off as well as shoe cap and pt ambulated 20' with minA for safety and to promote L Lean. Notable R hip flexion increase greater that 50% of gait trial with pt noting to feel a difference before and after ankle weights being added.   Patient supine in  bed at end of session with brakes locked, bed alarm set, family present and all needs within reach.      Therapy Documentation Precautions:  Precautions Precautions: Fall Precaution Comments: R hemi; dysarthria Restrictions Weight Bearing Restrictions: Yes  Therapy/Group: Individual Therapy  Aarav Burgett PTA 07/18/2022, 3:46 PM

## 2022-07-19 LAB — GLUCOSE, CAPILLARY
Glucose-Capillary: 118 mg/dL — ABNORMAL HIGH (ref 70–99)
Glucose-Capillary: 138 mg/dL — ABNORMAL HIGH (ref 70–99)
Glucose-Capillary: 129 mg/dL — ABNORMAL HIGH (ref 70–99)
Glucose-Capillary: 143 mg/dL — ABNORMAL HIGH (ref 70–99)

## 2022-07-19 NOTE — Progress Notes (Signed)
PROGRESS NOTE   Subjective/Complaints:  No events overnight noted he feels like his leg is getting stronger gradually.  No new concerns or complaints.  Labs reviewed  ROS: Patient denies CP, SOB, N/V/D, abdominal pain, cough, new changes in motor or sensory function  Objective:   No results found. No results for input(s): "WBC", "HGB", "HCT", "PLT" in the last 72 hours.   No results for input(s): "NA", "K", "CL", "CO2", "GLUCOSE", "BUN", "CREATININE", "CALCIUM" in the last 72 hours.    Intake/Output Summary (Last 24 hours) at 07/19/2022 1805 Last data filed at 07/19/2022 1200 Gross per 24 hour  Intake 455 ml  Output 180 ml  Net 275 ml         Physical Exam: Vital Signs Blood pressure 129/86, pulse 65, temperature 98.1 F (36.7 C), temperature source Oral, resp. rate 16, height 5\' 9"  (1.753 m), weight 96 kg, SpO2 98 %.   General: No acute distress.  Laying in bed.  AFO at the bedside Mood and affect are appropriate Heart: Regular rate and rhythm no rubs murmurs or extra sounds Lungs:CTAB, non-labored Abdomen: Positive bowel sounds, soft nontender to palpation, nondistended Extremities: No clubbing, cyanosis, or edema. + R middle DIP amputation   Skin: No evidence of breakdown, no evidence of rash Neurologic: No apparent word finding deficits. 0/5 right UE. 3-/5 hip flexor, 3- knee extensors, 1/5 ankle dorsiflexor and 2- plantar flexor. Extensor tone RLE with PROM still present  Hypotonic right upper extremity Sensory exam normal sensation to light touch  in bilateral upper and lower extremities Cerebellar exam weakness on right precludes testing  Musculoskeletal: Full range of motion in all 4 extremities. Mild erythema and jt swelling RIght index MCP with TTP , no wrist synovitis or pain with ROM, no swelling in ankles or pain with ankle ROM      Assessment/Plan: 1. Functional deficits which require 3+ hours  per day of interdisciplinary therapy in a comprehensive inpatient rehab setting. Physiatrist is providing close team supervision and 24 hour management of active medical problems listed below. Physiatrist and rehab team continue to assess barriers to discharge/monitor patient progress toward functional and medical goals  Care Tool:  Bathing    Body parts bathed by patient: Abdomen, Chest, Right arm, Front perineal area, Right upper leg, Left upper leg, Face, Left arm, Left lower leg, Right lower leg   Body parts bathed by helper: Left arm, Buttocks, Right lower leg, Left lower leg     Bathing assist Assist Level: Minimal Assistance - Patient > 75%     Upper Body Dressing/Undressing Upper body dressing   What is the patient wearing?: Pull over shirt    Upper body assist Assist Level: Supervision/Verbal cueing    Lower Body Dressing/Undressing Lower body dressing      What is the patient wearing?: Incontinence brief, Pants     Lower body assist Assist for lower body dressing: Moderate Assistance - Patient 50 - 74%     Toileting Toileting    Toileting assist Assist for toileting: Moderate Assistance - Patient 50 - 74%     Transfers Chair/bed transfer  Transfers assist  Chair/bed transfer activity did not occur: Safety/medical  concerns  Chair/bed transfer assist level: Minimal Assistance - Patient > 75%     Locomotion Ambulation   Ambulation assist   Ambulation activity did not occur: Safety/medical concerns  Assist level: Moderate Assistance - Patient 50 - 74%   Max distance: 70   Walk 10 feet activity   Assist  Walk 10 feet activity did not occur: Safety/medical concerns  Assist level: Moderate Assistance - Patient - 50 - 74% Assistive device: Walker-rolling (R hemi-grip)   Walk 50 feet activity   Assist Walk 50 feet with 2 turns activity did not occur: Safety/medical concerns         Walk 150 feet activity   Assist Walk 150 feet activity  did not occur: Safety/medical concerns         Walk 10 feet on uneven surface  activity   Assist Walk 10 feet on uneven surfaces activity did not occur: Safety/medical concerns         Wheelchair     Assist Is the patient using a wheelchair?: Yes Type of Wheelchair: Manual    Wheelchair assist level: Minimal Assistance - Patient > 75% Max wheelchair distance: 50 ft    Wheelchair 50 feet with 2 turns activity    Assist        Assist Level: Dependent - Patient 0%   Wheelchair 150 feet activity     Assist      Assist Level: Dependent - Patient 0%   Blood pressure 129/86, pulse 65, temperature 98.1 F (36.7 C), temperature source Oral, resp. rate 16, height 5\' 9"  (1.753 m), weight 96 kg, SpO2 98 %.  Medical Problem List and Plan: 1. 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 Large basal ganglia infarct. 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              -patient may  shower             -ELOS/Goals: extended 6/27             -Continue WHO and PRAFO             -Continue CIR therapies including PT, OT, and SLP   -AFO -at bedside  2.  Antithrombotics: -DVT/anticoagulation:  Pharmaceutical: Lovenox 4omg daily              -antiplatelet therapy: Aspirin and Plavix for three months followed by aspirin alone (started 5/20)   3. Pain Management: Tylenol, Robaxin as needed   4. Mood/Behavior/Sleep: LCSW to evaluate and provide emotional support             -antipsychotic agents: n/a             -trazodone as needed   5. Neuropsych/cognition: This patient is capable of making decisions on his own behalf.   6. Skin/Wound Care: Routine skin care checks   7. Fluids/Electrolytes/Nutrition: Routine Is and Os and follow-up chemistries             -continue dys3/advanced to thin liquids; SLP following   8:  Hypertension: monitor TID and prn (home meds: amlodipine 10 mg QD, valsartan 320 mg daily)             -continue irbesartan 37.5 mg daily    07/19/2022    2:24 PM 07/19/2022    1:09 PM 07/19/2022    3:34 AM  Vitals with BMI  Systolic  129  131  Diastolic  86 75  Pulse 65 84 65     -6/2   irbesartan increased to 75mg , BP now controlled 6/7  - 6/16 well-controlled, continue current regimen   9: Hyperlipidemia: continue high intensity statin, change simvastatin to atorvastatin 80mg  daily   10: Hyponatremia, mild Na 132: follow-up BMP, will fluid restrict - recheck on 5/27- improving cont fluid restriction , renal status stable    6/3 stable , mild hyponatremia , asymptomatic   6/15 last Na 6/10 up to 133  Check tomorrow    Latest Ref Rng & Units 07/13/2022    6:40 AM 07/06/2022    7:03 AM 06/29/2022    5:58 AM  BMP  Glucose 70 - 99 mg/dL 409  811  914   BUN 8 - 23 mg/dL 16  13  16    Creatinine 0.61 - 1.24 mg/dL 7.82  9.56  2.13   Sodium 135 - 145 mmol/L 133  131  132   Potassium 3.5 - 5.1 mmol/L 3.9  3.8  3.9   Chloride 98 - 111 mmol/L 101  100  101   CO2 22 - 32 mmol/L 22  22  24    Calcium 8.9 - 10.3 mg/dL 9.1  9.0  8.7     11: History of gout: restart home colchicine   12: DM type II: HGB A1C 6.7, CBGs QID and SSI; carb  modified diet when advanced (home meds include metformin 1000 mg BID) CBG (last 3)  Recent Labs    07/19/22 0659 07/19/22 1125 07/19/22 1629  GLUCAP 118* 138* 129*   6/7 good control a bit low , change SSI to sensitive, no hs coverage  6/12 controlled   6/16 CBGs well-controlled, continue to monitor   13: RLE/RUE weakness:normal LE  venous duplex   14: Tobacco use: occasional cigars, advise cessation   15. Constipation             -resolved. Had bm   type 5 6/1  -only on prn senokot-s and sorbitol   - LBM 6/15, having regular bowel movements LOS: 25 days A FACE TO FACE EVALUATION WAS PERFORMED  Fanny Dance 07/19/2022, 6:05 PM

## 2022-07-20 LAB — CBC
HCT: 41.1 % (ref 39.0–52.0)
Hemoglobin: 14.9 g/dL (ref 13.0–17.0)
MCH: 32.8 pg (ref 26.0–34.0)
MCHC: 36.3 g/dL — ABNORMAL HIGH (ref 30.0–36.0)
MCV: 90.5 fL (ref 80.0–100.0)
Platelets: 246 10*3/uL (ref 150–400)
RBC: 4.54 MIL/uL (ref 4.22–5.81)
RDW: 12.3 % (ref 11.5–15.5)
WBC: 8.6 10*3/uL (ref 4.0–10.5)
nRBC: 0 % (ref 0.0–0.2)

## 2022-07-20 LAB — BASIC METABOLIC PANEL
Anion gap: 11 (ref 5–15)
BUN: 21 mg/dL (ref 8–23)
CO2: 24 mmol/L (ref 22–32)
Calcium: 8.9 mg/dL (ref 8.9–10.3)
Chloride: 99 mmol/L (ref 98–111)
Creatinine, Ser: 1.04 mg/dL (ref 0.61–1.24)
GFR, Estimated: >60 mL/min (ref >60)
Glucose, Bld: 129 mg/dL — ABNORMAL HIGH (ref 70–99)
Potassium: 3.9 mmol/L (ref 3.5–5.1)
Sodium: 134 mmol/L — ABNORMAL LOW (ref 135–145)

## 2022-07-20 LAB — GLUCOSE, CAPILLARY
Glucose-Capillary: 127 mg/dL — ABNORMAL HIGH (ref 70–99)
Glucose-Capillary: 165 mg/dL — ABNORMAL HIGH (ref 70–99)
Glucose-Capillary: 100 mg/dL — ABNORMAL HIGH (ref 70–99)
Glucose-Capillary: 154 mg/dL — ABNORMAL HIGH (ref 70–99)

## 2022-07-20 NOTE — Progress Notes (Signed)
Physical Therapy Session Note  Patient Details  Name: Jimmy Valentine MRN: [id number] Date of Birth: 10/05/1950  Today's Date: 07/20/2022 PT Individual Time: 1122-1204 PT Individual Time Calculation (min): 42 min   Short Term Goals: Week 3:  PT Short Term Goal 1 (Week 3): Pt will perform supine<>sit with min assist from L EOB PT Short Term Goal 1 - Progress (Week 3): Met PT Short Term Goal 2 (Week 3): Pt will perform sit<>stands using LRAD with min assist PT Short Term Goal 2 - Progress (Week 3): Met PT Short Term Goal 3 (Week 3): Pt will perform bed<>chair transfers using LRAD with min assist PT Short Term Goal 3 - Progress (Week 3): Met PT Short Term Goal 4 (Week 3): Pt will ambulate at least 55ft using LRAD with min assist PT Short Term Goal 4 - Progress (Week 3): Partly met (64' with min/mod A) PT Short Term Goal 5 (Week 3): Pt will navigate 4 steps using HRs with mod assist of 1 PT Short Term Goal 5 - Progress (Week 3): Progressing toward goal Week 4:     Skilled Therapeutic Interventions/Progress Updates:  Patient eated upright in w/c on entrance to room. Patient alert and agreeable to PT session. Has new shoes and AFO donned to RLE.   Patient with no pain complaint at start of session.  Therapeutic Activity: Transfers: Pt performed sit<>stand transfers throughout session with MinA. With vc/ tc for focus in forward lean for proper weight transfer, pt is able to perform with light CGA. Stand pivot transfer performed at end of ambulation bouts and require significant cueing for safety and sequencing as well as ModA to manage RW and RLE step progression in pivot step.  Gait Training:  Pt ambulated 65 ft x2 using RW with RUE saddle splint and Min/ ModA for balance and RLE progression in first bout. Then is MinA for balance and intermittent assist in RLE after shoe cover donned to R foot. Demonstrated increased lateral lean to L for hip hike to R side for improved RLE progression. Pt  cued to decrease lean and focus on R hip/ knee flexion in swing phase as well as forward hip extension/ forward pelvic displacement for increased ease in R step progression. Provided vc/ tc for facilitation of technique.  Neuromuscular Re-ed: NMR facilitated during session with focus on standing balance and motor control. Pt guided in blocked practice of sit<>stand  in order to improve focus to RLE activation and positioning during transfers. Improves from MinA to near supervision. NMR performed for improvements in motor control and coordination, balance, sequencing, judgement, and self confidence/ efficacy in performing all aspects of mobility at highest level of independence.   Patient seated upright in w/c at end of session with brakes locked, seat pad alarm set, and all needs within reach.   Therapy Documentation Precautions:  Precautions Precautions: Fall Precaution Comments: R hemi; dysarthria Restrictions Weight Bearing Restrictions: Yes General:   Vital Signs:   Pain:  No pain indicated this session.    Therapy/Group: Individual Therapy  Loel Dubonnet PT, DPT, CSRS 07/20/2022, 6:34 PM

## 2022-07-20 NOTE — Progress Notes (Signed)
PROGRESS NOTE   Subjective/Complaints:  No issues overnite , still no sig movement in UE   Labs reviewed  ROS: Patient denies CP, SOB, N/V/D, abdominal pain, cough, new changes in motor or sensory function  Objective:   No results found. Recent Labs    07/20/22 0610  WBC 8.6  HGB 14.9  HCT 41.1  PLT 246     Recent Labs    07/20/22 0610  NA 134*  K 3.9  CL 99  CO2 24  GLUCOSE 129*  BUN 21  CREATININE 1.04  CALCIUM 8.9      Intake/Output Summary (Last 24 hours) at 07/20/2022 0833 Last data filed at 07/20/2022 0811 Gross per 24 hour  Intake 338 ml  Output 850 ml  Net -512 ml         Physical Exam: Vital Signs Blood pressure (!) 153/90, pulse 72, temperature 98 F (36.7 C), resp. rate 18, height 5\' 9"  (1.753 m), weight 96 kg, SpO2 97 %.   General: No acute distress.  Laying in bed.  AFO at the bedside Mood and affect are appropriate Heart: Regular rate and rhythm no rubs murmurs or extra sounds Lungs:CTAB, non-labored Abdomen: Positive bowel sounds, soft nontender to palpation, nondistended Extremities: No clubbing, cyanosis, or edema. + R middle DIP amputation   Skin: No evidence of breakdown, no evidence of rash Neurologic: No apparent word finding deficits. 0/5 right UE. 3-/5 hip flexor, 3- knee extensors, 1/5 ankle dorsiflexor and 2- plantar flexor. Extensor tone RLE with PROM still present  Hypotonic right upper extremity Sensory exam normal sensation to light touch  in bilateral upper and lower extremities Cerebellar exam weakness on right precludes testing  Musculoskeletal: Full range of motion in all 4 extremities. Mild erythema and jt swelling RIght index MCP with TTP , no wrist synovitis or pain with ROM, no swelling in ankles or pain with ankle ROM      Assessment/Plan: 1. Functional deficits which require 3+ hours per day of interdisciplinary therapy in a comprehensive inpatient  rehab setting. Physiatrist is providing close team supervision and 24 hour management of active medical problems listed below. Physiatrist and rehab team continue to assess barriers to discharge/monitor patient progress toward functional and medical goals  Care Tool:  Bathing    Body parts bathed by patient: Abdomen, Chest, Right arm, Front perineal area, Right upper leg, Left upper leg, Face, Left arm, Left lower leg, Right lower leg   Body parts bathed by helper: Left arm, Buttocks, Right lower leg, Left lower leg     Bathing assist Assist Level: Minimal Assistance - Patient > 75%     Upper Body Dressing/Undressing Upper body dressing   What is the patient wearing?: Pull over shirt    Upper body assist Assist Level: Supervision/Verbal cueing    Lower Body Dressing/Undressing Lower body dressing      What is the patient wearing?: Incontinence brief, Pants     Lower body assist Assist for lower body dressing: Moderate Assistance - Patient 50 - 74%     Toileting Toileting    Toileting assist Assist for toileting: Moderate Assistance - Patient 50 - 74%  Transfers Chair/bed transfer  Transfers assist  Chair/bed transfer activity did not occur: Safety/medical concerns  Chair/bed transfer assist level: Minimal Assistance - Patient > 75%     Locomotion Ambulation   Ambulation assist   Ambulation activity did not occur: Safety/medical concerns  Assist level: Moderate Assistance - Patient 50 - 74%   Max distance: 70   Walk 10 feet activity   Assist  Walk 10 feet activity did not occur: Safety/medical concerns  Assist level: Moderate Assistance - Patient - 50 - 74% Assistive device: Walker-rolling (R hemi-grip)   Walk 50 feet activity   Assist Walk 50 feet with 2 turns activity did not occur: Safety/medical concerns         Walk 150 feet activity   Assist Walk 150 feet activity did not occur: Safety/medical concerns         Walk 10 feet  on uneven surface  activity   Assist Walk 10 feet on uneven surfaces activity did not occur: Safety/medical concerns         Wheelchair     Assist Is the patient using a wheelchair?: Yes Type of Wheelchair: Manual    Wheelchair assist level: Minimal Assistance - Patient > 75% Max wheelchair distance: 50 ft    Wheelchair 50 feet with 2 turns activity    Assist        Assist Level: Dependent - Patient 0%   Wheelchair 150 feet activity     Assist      Assist Level: Dependent - Patient 0%   Blood pressure (!) 153/90, pulse 72, temperature 98 F (36.7 C), resp. rate 18, height 5\' 9"  (1.753 m), weight 96 kg, SpO2 97 %.  Medical Problem List and Plan: 1. 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 Large basal ganglia infarct. 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              -patient may  shower             -ELOS/Goals: extended 6/27             -Continue WHO and PRAFO             -Continue CIR therapies including PT, OT, and SLP   -AFO -at bedside  2.  Antithrombotics: -DVT/anticoagulation:  Pharmaceutical: Lovenox 4omg daily              -antiplatelet therapy: Aspirin and Plavix for three months followed by aspirin alone (started 5/20)   3. Pain Management: Tylenol, Robaxin as needed   4. Mood/Behavior/Sleep: LCSW to evaluate and provide emotional support             -antipsychotic agents: n/a             -trazodone as needed   5. Neuropsych/cognition: This patient is capable of making decisions on his own behalf.   6. Skin/Wound Care: Routine skin care checks   7. Fluids/Electrolytes/Nutrition: Routine Is and Os and follow-up chemistries             -continue dys3/advanced to thin liquids; SLP following   8: Hypertension: monitor TID and prn (home meds: amlodipine 10 mg QD, valsartan 320 mg daily)              -continue irbesartan 37.5 mg daily    07/20/2022    5:31 AM 07/19/2022    8:48 PM 07/19/2022  2:24 PM  Vitals with BMI  Systolic 153 142   Diastolic 90 74   Pulse 72 80 65     -6/2   irbesartan increased to 75mg , BP now controlled 6/7 6/17   9: Hyperlipidemia: continue high intensity statin, change simvastatin to atorvastatin 80mg  daily   10: Hyponatremia,Na+ fluctuating , Na+ down to 129 from 136 is off FR will resume        Latest Ref Rng & Units 07/20/2022    6:10 AM 07/13/2022    6:40 AM 07/06/2022    7:03 AM  BMP  Glucose 70 - 99 mg/dL 161  096  045   BUN 8 - 23 mg/dL 21  16  13    Creatinine 0.61 - 1.24 mg/dL 4.09  8.11  9.14   Sodium 135 - 145 mmol/L 134  133  131   Potassium 3.5 - 5.1 mmol/L 3.9  3.9  3.8   Chloride 98 - 111 mmol/L 99  101  100   CO2 22 - 32 mmol/L 24  22  22    Calcium 8.9 - 10.3 mg/dL 8.9  9.1  9.0     11: History of gout: resolved off colchicine   12: DM type II: HGB A1C 6.7, CBGs QID and SSI; carb  modified diet when advanced (home meds include metformin 1000 mg BID) CBG (last 3)  Recent Labs    07/19/22 1629 07/19/22 2042 07/20/22 0531  GLUCAP 129* 143* 127*   Controlled 6/17   13: RLE/RUE weakness:normal LE  venous duplex   14: Tobacco use: occasional cigars, advise cessation   15. Constipation             -resolved. Had bm   type 5 6/1  -only on prn senokot-s and sorbitol   - LBM 6/15, having regular bowel movements LOS: 26 days A FACE TO FACE EVALUATION WAS PERFORMED  Erick Colace 07/20/2022, 8:33 AM

## 2022-07-20 NOTE — Progress Notes (Signed)
Physical Therapy Session Note  Patient Details  Name: Jimmy Valentine MRN: [id number] Date of Birth: 1950/10/01  Today's Date: 07/20/2022 PT Individual Time: 1300-1402 PT Individual Time Calculation (min): 62 min   Short Term Goals: Week 3:  PT Short Term Goal 1 (Week 3): Pt will perform supine<>sit with min assist from L EOB PT Short Term Goal 1 - Progress (Week 3): Met PT Short Term Goal 2 (Week 3): Pt will perform sit<>stands using LRAD with min assist PT Short Term Goal 2 - Progress (Week 3): Met PT Short Term Goal 3 (Week 3): Pt will perform bed<>chair transfers using LRAD with min assist PT Short Term Goal 3 - Progress (Week 3): Met PT Short Term Goal 4 (Week 3): Pt will ambulate at least 19ft using LRAD with min assist PT Short Term Goal 4 - Progress (Week 3): Partly met (64' with min/mod A) PT Short Term Goal 5 (Week 3): Pt will navigate 4 steps using HRs with mod assist of 1 PT Short Term Goal 5 - Progress (Week 3): Progressing toward goal Week 4:  PT Short Term Goal 1 (Week 4): STG = LTG d/t ELOS  Skilled Therapeutic Interventions/Progress Updates:  Patient seated upright in w.c on entrance to room. Patient alert and agreeable to PT session. CPO present for ambulation assessment for individual AFO.   Patient with no pain complaint at start of session.  Therapeutic Activity: Bed Mobility: Pt performed sit > supine at end of session with MinA for RLE. VC/ tc required for effort with RLE and minimally for technique. Transfers: Pt performed sit<>stand transfers with and stand pivot transfers throughout session with CGA/ MinA. Provided verbal cues for focus to technique with forward lean and BLE use for push into extension.   Attempt for toe touch to 2" step leading with hip/ knee flexion. Focus on controlling knee flexion and lift of knee from floor in touching step and then stepping back from step. Maintains balance throughout but RLE equires MaxA to complete x10.  Gait  Training/ Orthotist Assessment: Pt ambulated 10 ft with no AFO using RW with RUE saddle splint with overall ModA for RLE advancement and balance. Demonstrated reduced ability to coordinate DF with initiation of swing phase, decreased abiity to advance R hip forward with reduced glute activation. Holds R knee in flexion during stance phase with reduced ability to reach controlled TKE. Provided vc/ tc for initiation of swing phase. Then ambulates with [place] Walk-on PLS AFO and shoe cover over R toe. Continued vc/ tc and mild facilitation for increased forward translation of R hip during swing phase. Pt continues to tend to lean too far to L with start of LOB but is able to self correct. Continued vc/ tc to reduce L lean to produce R hip hike and instead focus on R hip forward translation. CPO determines need for taller strut to accommodate TKE, slight increase in stiffness overall and toe cap. PT with recommendation for continued level of flexibility as pt's activation  in RLE is slowly returning and may accommodate for progress better than full rigid AFO.   Patient supine in bed at end of session with brakes locked, bed alarm set, and all needs within reach.   Therapy Documentation Precautions:  Precautions Precautions: Fall Precaution Comments: R hemi; dysarthria Restrictions Weight Bearing Restrictions: Yes General:   Vital Signs:   Pain:  No pain related throughout session. Arm sling donned.   Therapy/Group: Individual Therapy  Loel Dubonnet PT, DPT, CSRS  07/20/2022, 6:35 PM

## 2022-07-20 NOTE — Progress Notes (Signed)
Occupational Therapy Session Note  Patient Details  Name: Jimmy Valentine MRN: [id number] Date of Birth: 1951/01/03  Session 1: Today's Date: 07/20/2022 OT Individual Time: 0901-1000 OT Individual Time Calculation (min): 59 min   Session 2: Today's Date: 07/20/2022 OT Individual Time: 4098-1191 OT Individual Time Calculation (min): 47 min   Short Term Goals: Week 1:  OT Short Term Goal 1 (Week 3): STGs = LTGs OT Short Term Goal 1 - Progress (Week 3): Progressing toward goal  Skilled Therapeutic Interventions/Progress Updates:    Session 1: Patient agreeable to participate in OT session. Reports 0/10 pain level.   Patient participated in skilled OT session focusing on ADL re-training, functional transfers, and iPhone accessibility assist and education. Rehab tech available to assist with walk-in shower transfer although pt able to complete std pvt transfer with 1 person min a towards left side and utilizing grab bar.  Pt completed bathing while seated on tub bench. UB bathing completed with Min A requiring assist to wash right arm due to left hemi. LB bathing completed with Mod A. VC provided for sequencing in order to wash all body parts. Min A provided to perform stand pvt transfer from shower to [facility] going towards the right. VC for hand placement and safety provided.  Pt completed dressing seated in WC at sink level. Therapist educated pt on hemi dressing technique for both upper and lower dressing in order to promote improved functional performance and independence when completing self care tasks at home when discharged.  UB dressing completed with Min A and LB dressing completed with Max A. Therapist blocked right knee and provided stability to RLE to prevent internal rotation of hip during all sit to stand transitions. Once standing pt was able to maintain standing posture without manual facilitation provided to right LE.   Assisted pt with accessibility features on iphone. Unable to stop  need for passcode entry as pt does not know password for apple ID. Switched touch ID from right thumb to left thumb as pt [place] unable to functionally utilize right hand to manipulate phone.    Session 2: Patient agreeable to participate in OT session. Reports 0/10 pain level.   Patient participated in skilled OT session focusing on patient education regarding shoulder subluxation. Right shoulder assessed and pt presents with 1 fingers subluxation.  Functional transfer from [facility] to bed utilizing RW with hand splint completed with Min A. VC for sequencing and direction as well as hand placement were provided. With increased time, pt completed transfer with Min A for balance while transferring towards right side. Manual facilitation provided to right knee to prevent hip er during sit to stand transition.  -A/ROM completed while seated on EOB; RUE shoulder shrugs, scapular retraction, 10X, 1 set. - AA/ROM completed with RUE while seated on EOB. Right forearm resting on table tray with forearm placed on pillow case. Muscle tapping performed to facilitate muscle activation during scapular protraction/retraction and elbow flexion/extension, 5-10X completed, 1 set. -Kinstiotape applied to right shoulder to support AC joint due to 1 finger subluxation. Verbal education provided on purpose, wearing timeframe, precautions, and removal process. Handout provided for reference. Pt verbalized understanding. 1 finger subluxation eliminated once tape was applied.  - Pt education provided on proper RUE positioning when in bed or sitting up in chair due to subluxation. Discussed precautions during UE handling and patient care. Pt verbalized understanding.   Therapy Documentation Precautions:  Precautions Precautions: Fall Precaution Comments: R hemi; dysarthria Restrictions Weight Bearing Restrictions: Yes  Therapy/Group: Individual Therapy  Limmie Patricia, OTR/L,CBIS  Supplemental OT - [facility] and [facility] Secure Chat  Preferred   07/20/2022, 9:27 AM

## 2022-07-21 LAB — GLUCOSE, CAPILLARY
Glucose-Capillary: 123 mg/dL — ABNORMAL HIGH (ref 70–99)
Glucose-Capillary: 124 mg/dL — ABNORMAL HIGH (ref 70–99)
Glucose-Capillary: 112 mg/dL — ABNORMAL HIGH (ref 70–99)

## 2022-07-21 MED ORDER — SENNOSIDES-DOCUSATE SODIUM 8.6-50 MG PO TABS
2.0000 | ORAL_TABLET | Freq: Two times a day (BID) | ORAL | Status: DC
  Administered 2022-07-21 – 2022-07-30 (×9): 2 via ORAL
  Filled 2022-07-21 (×15): qty 2

## 2022-07-21 MED ORDER — POLYETHYLENE GLYCOL 3350 17 G PO PACK
17.0000 g | PACK | Freq: Every day | ORAL | Status: DC
  Administered 2022-07-21 – 2022-07-25 (×3): 17 g via ORAL
  Filled 2022-07-21 (×6): qty 1

## 2022-07-21 NOTE — Progress Notes (Signed)
Physical Therapy Session Note  Patient Details  Name: Jimmy Valentine MRN: [id number] Date of Birth: [birth year]  Today's Date: 07/21/2022 PT Individual Time: 1401-1515 PT Individual Time Calculation (min): 74 min   Short Term Goals: Week 3:  PT Short Term Goal 1 (Week 3): Pt will perform supine<>sit with min assist from L EOB PT Short Term Goal 1 - Progress (Week 3): Met PT Short Term Goal 2 (Week 3): Pt will perform sit<>stands using LRAD with min assist PT Short Term Goal 2 - Progress (Week 3): Met PT Short Term Goal 3 (Week 3): Pt will perform bed<>chair transfers using LRAD with min assist PT Short Term Goal 3 - Progress (Week 3): Met PT Short Term Goal 4 (Week 3): Pt will ambulate at least 58ft using LRAD with min assist PT Short Term Goal 4 - Progress (Week 3): Partly met (64' with min/mod A) PT Short Term Goal 5 (Week 3): Pt will navigate 4 steps using HRs with mod assist of 1 PT Short Term Goal 5 - Progress (Week 3): Progressing toward goal Week 4:  PT Short Term Goal 1 (Week 4): STG = LTG d/t ELOS  Skilled Therapeutic Interventions/Progress Updates:  Patient seated upright in recliner on entrance to room. Completing OT session. Patient alert and agreeable to PT session. CPO returned with Thusane Sprystep PLS AFO and [place] WalkOn PLS AFO for trials.   Patient with no pain complaint at start of session.  Therapeutic Activity: Bed Mobility: Pt performed sit>supine with CGA for RLE. VC/ tc required for initiation and performance. Transfers: Pt performed sit<>stand transfers throughout session with MinA following focus with RLE positioning and BLE use with forward lean. Can sometimes require several attempts prior to reaching if he is not directed into focus of positioning and technique first. Provided vc throughout for technique prn.   Gait Training:  Pt ambulated 69' x2 trialing different AFOs provided by Hannah Beat from [facility]. Pt is able to self advance RLE with  donning of Thusane Sprystep PLS AFO. AFO is more rigid than expected but is providing appropriate level of support for pt's impairments in ambulation without. Pt also relates comfort. Provided vc/ tc for RLE sequencing advancement. Overall pt requires MinA for ambulation with continued demo of increased L trunk lean in order to compensate for initiating RLE swing phase. Also demos intermittent circumduction for foot clearance in advancement. Potential for LOB to L d/t severe L lean. Provided pt with facilitation of forward advancement of R hip in order to improve RLE foot clearance and initiation of swing phase.   Neuromuscular Re-ed: NMR facilitated during session with focus on standing balance. Pt guided in blocked practice of sit<>stand transfers . NMR performed for improvements in motor control and coordination, balance, sequencing, judgement, and self confidence/ efficacy in performing all aspects of mobility at highest level of independence.   Pt's brother-in-law present after CPO leaves. Pt excited to demo ambulation progress. Emotions improved with demo of improved ambulation with decreased need for assist with RLE advancement.   Patient supine in bed at end of session with brakes locked, bed alarm set, and all needs within reach.   Therapy Documentation Precautions:  Precautions Precautions: Fall Precaution Comments: R hemi; dysarthria Restrictions Weight Bearing Restrictions: Yes General:   Vital Signs: Therapy Vitals Temp: 98.2 F (36.8 C) Temp Source: Oral Pulse Rate: 78 Resp: 18 BP: (!) 141/71 Oxygen Therapy SpO2: 98 % Pain:  No pain related throughout session.    Therapy/Group: Individual  Therapy  Loel Dubonnet PT, DPT, CSRS 07/21/2022, 5:16 PM

## 2022-07-21 NOTE — Progress Notes (Signed)
Physical Therapy Session Note  Patient Details  Name: SAMMIE AKKERMAN MRN: [id number] Date of Birth: 09/04/1950  Today's Date: 07/21/2022 PT Individual Time: 0800-0830 PT Individual Time Calculation (min): 30 min   Short Term Goals: Week 4:  PT Short Term Goal 1 (Week 4): STG = LTG d/t ELOS  Skilled Therapeutic Interventions/Progress Updates:    Pt presents on toilet with NT present. Pt with no void and RN was made aware. Focused on sit <> stands for hygiene and donning of clothing with focus on technique and activation through RLE. Pt performed sit <> stands with min assist, cues for attention to RUE placement and management. Donned shirt with min assist and assist with threading of RLE with pants for time management. Total assist for shoes and due to awaiting personal AFO (and shoe was taken by [facility] for toe cap), just put his other sneakers on for now until returned later. Performed balance assessment (see below).   Five times Sit to Stand Test (FTSS) Method: Use a straight back chair with a solid seat that is 16-18" high. Ask participant to sit on the chair with arms folded across their chest.   Instructions: "Stand up and sit down as quickly as possible 5 times, keeping your arms folded across your chest."   Measurement: Stop timing when the participant stands the 5th time.  TIME: ___42___ (in seconds)  Times > 13.6 seconds is associated with increased disability and morbidity (Guralnik, 2000) Times > 15 seconds is predictive of recurrent falls in healthy individuals aged 65 and older (Buatois, et al., 2008) Normal performance values in community dwelling individuals aged 30 and older (Bohannon, 2006): 60-69 years: 11.4 seconds 70-79 years: 12.6 seconds 80-89 years: 14.8 seconds  MCID: ? 2.3 seconds for Vestibular Disorders Wray Kearns, 2006)   Therapy Documentation Precautions:  Precautions Precautions: Fall Precaution Comments: R hemi; dysarthria Restrictions Weight Bearing  Restrictions: Yes  Pain: Pain Assessment Pain Scale: 0-10 Pain Score: 0-No pain   Therapy/Group: Individual Therapy  Karolee Stamps Darrol Poke, PT, DPT, CBIS  07/21/2022, 8:45 AM

## 2022-07-21 NOTE — Progress Notes (Signed)
Occupational Therapy Session Note  Patient Details  Name: KRISHNA SCHNETZER MRN: [id number] Date of Birth: 1950/02/09  Today's Date: 07/21/2022 OT Individual Time: 1030-1045 (PT took other minutes for w/c eval) 1st Session; but OT attended whole 30 min, 1300-1400 2nd Session  OT Individual Time Calculation (min): 15 min (PT took the other parts of the billing) 60 min     Short Term Goals: Week 4:  OT Short Term Goal 1 (Week 4): Pt will be able to complete stand pivot transfers with RW w/c to toilet and back with CGA. OT Short Term Goal 2 (Week 4): Pt will be able to complete stand pivot transfers with RW w/c to shower bench and back with CGA. OT Short Term Goal 3 (Week 4): Pt will don a shirt with supervision. OT Short Term Goal 4 (Week 4): pt will be able to don R leg into pants with min A. OT Short Term Goal 5 (Week 4): Pt will be able to actively move R arm on and off his lap to demonstrate increased shoulder control .  Skilled Therapeutic Interventions/Progress Updates:   Session 1:  OT took part in wc consult with PT Budd Palmer and ATP Brandon from [employer]. Pt transferred from w/c to mat upon OT arrival to day room. Sitting balance significantly improved and foot stool placed under feet for maximal support for assessment. OT strongly recommending R arm support and a back support as being upright for sh alignment. Decision made for a custom adjustable back for support but still able to fold w/c to provide maximal scapular and sh alignment and support. Hx of sciatica thus appropriate height seating for sit to stand and custom cushion for pelvic and spinal nerve support to offload weight. Padded R UE swing back UE 1/2 lap tray recommended.  Loaner chair will be brought Tues and pt will transport home in his [employer]. Left pt with PT and ATP for the rest of the session to conclude rest of assessment.   Pain:  No pain reported during w/c assessment on EOM   Session 2:  Pt seen for skilled  OT session this pm. OT update for Team Conference based on this session performance as follows: assessed current ktape in place until 6/21 assisting as well as supregear strap for R sublux mngt, mod approaching min A from w/c to toilet, tub bench and recliner via SPT with bar and adapted RW, given CGA support- pants mngt with min A after toileting, set up UB self care with hemi techniques, min A fading to CGA for seated LB bathing, stood sink side for hand washing with CGA. Ongoing prep for d/c home next week with family and pt educ, w/c eval completed, loaner for delivery next Tues based on this am w/c eval, some AROM noted R elbow and hand flexors after estim with SaeboStim One for wrist flexors and extensors. Facilitated grasp/release with estim in place for bean bag 10 trials. See below for Saebo parameters and response. Hand off to [employer] and PT for next session with pt up in supportive recliner now with ROHO cushion added and LE's elevated.   Saebo StimOne parameters:    no adverse skin reactions.  330 pulse width 35 Hz pulse rate On 8 sec/ off 8 sec Ramp up/ down 2 sec Symmetrical Biphasic wave form  Max intensity at 500 Ohm load  Pain: denies all pain    Therapy Documentation Precautions:  Precautions Precautions: Fall Precaution Comments: R hemi; dysarthria  Restrictions Weight Bearing Restrictions: Yes   Therapy/Group: Individual Therapy  Vicenta Dunning 07/21/2022, 7:38 AM

## 2022-07-21 NOTE — Progress Notes (Signed)
Physical Therapy Session Note  Patient Details  Name: Jimmy Valentine MRN: [id number] Date of Birth: 09-28-1950  Today's Date: 07/21/2022 PT Individual Time: 1015-1029 and  4098-1191 PT Individual Time Calculation (min): 14 min and 34 min   Short Term Goals: Week 3:  PT Short Term Goal 1 (Week 3): Pt will perform supine<>sit with min assist from L EOB PT Short Term Goal 1 - Progress (Week 3): Met PT Short Term Goal 2 (Week 3): Pt will perform sit<>stands using LRAD with min assist PT Short Term Goal 2 - Progress (Week 3): Met PT Short Term Goal 3 (Week 3): Pt will perform bed<>chair transfers using LRAD with min assist PT Short Term Goal 3 - Progress (Week 3): Met PT Short Term Goal 4 (Week 3): Pt will ambulate at least 23ft using LRAD with min assist PT Short Term Goal 4 - Progress (Week 3): Partly met (64' with min/mod A) PT Short Term Goal 5 (Week 3): Pt will navigate 4 steps using HRs with mod assist of 1 PT Short Term Goal 5 - Progress (Week 3): Progressing toward goal Week 4:  PT Short Term Goal 1 (Week 4): STG = LTG d/t ELOS  Skilled Therapeutic Interventions/Progress Updates:  Patient seated upright in w/c on entrance to room. Patient alert and agreeable to PT session.   Patient with no pain complaint at start of session.  Wheelchair Mobility:  Pt propelled wheelchair 75 feet with supervision into day room for ATP assessment of mobility. Provided vc/ tc for increased ease of turning/ maintaining path.  Harrie Jeans, ATP present for custom manual wheelchair evaluation. Valetta Fuller OT also present. Through discussion with pt it is determined that pt will require a custom ultra lightweight chair to achieve independence with mobility.     Therapists, patient, and ATP discussed the following necessities for pt's custom wheelchair:  - 17in wide x 17in depth wheelchair - decision to select an ultra-lightweight [id number] manual wheelchair in order to allow a lighter weight frame to  decrease burden of care on family while also allowing pt more independence with propulsion - Need for padded 1/2 lap tray for RUE to allow support of the extremity to improve joint alignment while also allow pt opportunities to utilize that UE for NMR - standard seat belt to provide pelvic stability as pt tends to sacral sit causing him to slide forward in w/c placing him at risk for falling - 2 degrees of camber to allow pt increased independence with turning using L hemi-technique propulsion - Minimal dump in the wheelchair to decrease risk of posterior pelvic tilt and improve pt's stability to decrease risk of falling out of the chair - Axiom contour cushion for support while seated upright d/t increased Bil hip ER.  - Tension adjustable back for trunk/ back support for improved sitting balance   Pt in agreement with the above recommendations. ATP planning to provide loaner wheelchair Tuesday 6/25 at 10am.   Therapeutic Activity: Transfers: Pt performed sit<>stand and stand pivot transfers throughout session with no UE support and MinA with setup of RLE positioning, focus on performance and outcome without rushing to complete. Improved squat pivot with improved hip ROM to allow for shift of hips from side to side in order to complete in 2 efforts. . Provided verbal cues for technique prior to initiation.  Pt slightly emotional at end of session d/t need for custom w/c. Pt has been focused on reaching ambulatory LOA in order to reduce burden on  caregivers and reach highest level of independence.   Patient seated upright in w/c at end of session with brakes locked, seat pad alarm set, and all needs within reach.   Therapy Documentation Precautions:  Precautions Precautions: Fall Precaution Comments: R hemi; dysarthria Restrictions Weight Bearing Restrictions: Yes General:   Vital Signs:  Pain:  No pain related this session.   Therapy/Group: Individual Therapy  Loel Dubonnet PT,  DPT, CSRS 07/21/2022, 12:43 PM

## 2022-07-21 NOTE — Progress Notes (Signed)
PROGRESS NOTE   Subjective/Complaints:  No issues overnite , discussed goals of extension , mainly mobility , using compensatory strategies for ADLs   Labs reviewed  ROS: Patient denies CP, SOB, N/V/D, abdominal pain, cough, new changes in motor or sensory function  Objective:   No results found. Recent Labs    07/20/22 0610  WBC 8.6  HGB 14.9  HCT 41.1  PLT 246     Recent Labs    07/20/22 0610  NA 134*  K 3.9  CL 99  CO2 24  GLUCOSE 129*  BUN 21  CREATININE 1.04  CALCIUM 8.9      Intake/Output Summary (Last 24 hours) at 07/21/2022 0817 Last data filed at 07/21/2022 0714 Gross per 24 hour  Intake 1072 ml  Output 1000 ml  Net 72 ml         Physical Exam: Vital Signs Blood pressure (!) 153/66, pulse 64, temperature 97.7 F (36.5 C), resp. rate 17, height 5\' 9"  (1.753 m), weight 97 kg, SpO2 97 %.   General: No acute distress.  Laying in bed.  AFO at the bedside Mood and affect are appropriate Heart: Regular rate and rhythm no rubs murmurs or extra sounds Lungs:CTA Bilateral , non-labored Abdomen: Positive bowel sounds, soft nontender to palpation, nondistended Extremities: No clubbing, cyanosis, or edema. + R middle DIP amputation   Skin: No evidence of breakdown, no evidence of rash Neurologic: No apparent word finding deficits. 0/5 right UE. 3-/5 hip flexor, 3- knee extensors, 1/5 ankle dorsiflexor and 2- plantar flexor. Extensor tone RLE with PROM still present  Hypotonic right upper extremity Sensory exam normal sensation to light touch  in bilateral upper and lower extremities Cerebellar exam weakness on right precludes testing  Musculoskeletal: Full range of motion in all 4 extremities. Mild erythema and jt swelling RIght index MCP with TTP , no wrist synovitis or pain with ROM, no swelling in ankles or pain with ankle ROM      Assessment/Plan: 1. Functional deficits which require 3+  hours per day of interdisciplinary therapy in a comprehensive inpatient rehab setting. Physiatrist is providing close team supervision and 24 hour management of active medical problems listed below. Physiatrist and rehab team continue to assess barriers to discharge/monitor patient progress toward functional and medical goals  Care Tool:  Bathing    Body parts bathed by patient: Abdomen, Chest, Right arm, Front perineal area, Right upper leg, Left upper leg, Face, Left arm, Left lower leg, Right lower leg   Body parts bathed by helper: Left arm, Buttocks, Right lower leg, Left lower leg     Bathing assist Assist Level: Minimal Assistance - Patient > 75%     Upper Body Dressing/Undressing Upper body dressing   What is the patient wearing?: Pull over shirt    Upper body assist Assist Level: Supervision/Verbal cueing    Lower Body Dressing/Undressing Lower body dressing      What is the patient wearing?: Incontinence brief, Pants     Lower body assist Assist for lower body dressing: Moderate Assistance - Patient 50 - 74%     Toileting Toileting    Toileting assist Assist for toileting: Moderate Assistance -  Patient 50 - 74%     Transfers Chair/bed transfer  Transfers assist  Chair/bed transfer activity did not occur: Safety/medical concerns  Chair/bed transfer assist level: Minimal Assistance - Patient > 75%     Locomotion Ambulation   Ambulation assist   Ambulation activity did not occur: Safety/medical concerns  Assist level: Moderate Assistance - Patient 50 - 74%   Max distance: 70   Walk 10 feet activity   Assist  Walk 10 feet activity did not occur: Safety/medical concerns  Assist level: Moderate Assistance - Patient - 50 - 74% Assistive device: Walker-rolling (R hemi-grip)   Walk 50 feet activity   Assist Walk 50 feet with 2 turns activity did not occur: Safety/medical concerns         Walk 150 feet activity   Assist Walk 150 feet  activity did not occur: Safety/medical concerns         Walk 10 feet on uneven surface  activity   Assist Walk 10 feet on uneven surfaces activity did not occur: Safety/medical concerns         Wheelchair     Assist Is the patient using a wheelchair?: Yes Type of Wheelchair: Manual    Wheelchair assist level: Minimal Assistance - Patient > 75% Max wheelchair distance: 50 ft    Wheelchair 50 feet with 2 turns activity    Assist        Assist Level: Dependent - Patient 0%   Wheelchair 150 feet activity     Assist      Assist Level: Dependent - Patient 0%   Blood pressure (!) 153/66, pulse 64, temperature 97.7 F (36.5 C), resp. rate 17, height 5\' 9"  (1.753 m), weight 97 kg, SpO2 97 %.  Medical Problem List and Plan: 1. 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 Large basal ganglia infarct. 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              -patient may  shower             -ELOS/Goals: extended 6/27             -Continue WHO and PRAFO             -Continue CIR therapies including PT, OT, and SLP   -AFO -at bedside  2.  Antithrombotics: -DVT/anticoagulation:  Pharmaceutical: Lovenox 4omg daily              -antiplatelet therapy: Aspirin and Plavix for three months followed by aspirin alone (started 5/20)   3. Pain Management: Tylenol, Robaxin as needed   4. Mood/Behavior/Sleep: LCSW to evaluate and provide emotional support             -antipsychotic agents: n/a             -trazodone as needed   5. Neuropsych/cognition: This patient is capable of making decisions on his own behalf.   6. Skin/Wound Care: Routine skin care checks   7. Fluids/Electrolytes/Nutrition: Routine Is and Os and follow-up chemistries             -continue dys3/advanced to thin liquids; SLP following   8: Hypertension:  monitor TID and prn (home meds: amlodipine 10 mg QD, valsartan 320 mg daily)             -continue irbesartan 37.5 mg daily    07/21/2022    5:15 AM  07/20/2022    2:11 PM 07/20/2022    5:31 AM  Vitals with BMI  Weight 213 lbs 14 oz    BMI 31.57    Systolic 153 109 161  Diastolic 66 67 90  Pulse 64 94 72     -6/2   irbesartan increased to 75mg , BP now controlled 6/7 6/17   9: Hyperlipidemia: continue high intensity statin, change simvastatin to atorvastatin 80mg  daily   10: Hyponatremia,Na+ fluctuating , Na+ down to 129 from 136 is off FR will resume        Latest Ref Rng & Units 07/20/2022    6:10 AM 07/13/2022    6:40 AM 07/06/2022    7:03 AM  BMP  Glucose 70 - 99 mg/dL 096  045  409   BUN 8 - 23 mg/dL 21  16  13    Creatinine 0.61 - 1.24 mg/dL 8.11  9.14  7.82   Sodium 135 - 145 mmol/L 134  133  131   Potassium 3.5 - 5.1 mmol/L 3.9  3.9  3.8   Chloride 98 - 111 mmol/L 99  101  100   CO2 22 - 32 mmol/L 24  22  22    Calcium 8.9 - 10.3 mg/dL 8.9  9.1  9.0     11: History of gout: resolved off colchicine   12: DM type II: HGB A1C 6.7, CBGs QID and SSI; carb  modified diet when advanced (home meds include metformin 1000 mg BID) CBG (last 3)  Recent Labs    07/20/22 1650 07/20/22 2109 07/21/22 0558  GLUCAP 100* 154* 123*   Controlled 6/18   13: RLE/RUE weakness:normal LE  venous duplex   14: Tobacco use: occasional cigars, advise cessation   15. Constipation             -resolved. Had bm   type 5 6/1  -only on prn senokot-s and sorbitol   - LBM 6/15, having regular bowel movements LOS: 27 days A FACE TO FACE EVALUATION WAS PERFORMED  Jimmy Valentine 07/21/2022, 8:17 AM

## 2022-07-22 LAB — GLUCOSE, CAPILLARY
Glucose-Capillary: 125 mg/dL — ABNORMAL HIGH (ref 70–99)
Glucose-Capillary: 144 mg/dL — ABNORMAL HIGH (ref 70–99)
Glucose-Capillary: 103 mg/dL — ABNORMAL HIGH (ref 70–99)
Glucose-Capillary: 153 mg/dL — ABNORMAL HIGH (ref 70–99)

## 2022-07-22 NOTE — Progress Notes (Signed)
Physical Therapy Session Note  Patient Details  Name: Jimmy Valentine MRN: [id number] Date of Birth: 1950-09-15  Today's Date: 07/22/2022 PT Individual Time: 1020-1130;1350 - 1500 PT Individual Time Calculation (min): 70 min; 70 min   Short Term Goals: Week 3:  PT Short Term Goal 1 (Week 3): Pt will perform supine<>sit with min assist from L EOB PT Short Term Goal 1 - Progress (Week 3): Met PT Short Term Goal 2 (Week 3): Pt will perform sit<>stands using LRAD with min assist PT Short Term Goal 2 - Progress (Week 3): Met PT Short Term Goal 3 (Week 3): Pt will perform bed<>chair transfers using LRAD with min assist PT Short Term Goal 3 - Progress (Week 3): Met PT Short Term Goal 4 (Week 3): Pt will ambulate at least 75ft using LRAD with min assist PT Short Term Goal 4 - Progress (Week 3): Partly met (64' with min/mod A) PT Short Term Goal 5 (Week 3): Pt will navigate 4 steps using HRs with mod assist of 1 PT Short Term Goal 5 - Progress (Week 3): Progressing toward goal Week 4:  PT Short Term Goal 1 (Week 4): STG = LTG d/t ELOS  SESSION 1 Skilled Therapeutic Interventions/Progress Updates: Patient in [facility] on entrance to room. Patient alert and agreeable to PT session.   Patient reported no pain at beginning of session. Patient reported receiving laxative this morning.  Therapeutic Activity: Transfers: Pt performed stand pivot with RW throughout session with CGA/light minA. Provided VC for anterior scoot and to use L UE to push instead of holding onto RW when sitting. Patient performed stand pivot with use of hand rail on wall by patient's bathroom toilet with minA for safety and time (per patient report of increased risk of BM).  Gait Training: R AFO donned (totalA) Pt ambulated 15' using no AD (rehab tech close by for safety and WC follow) with min/modA required for balance and R LE advancement by facilitating R anterior hip shift (less than 50% of the time). Patient had increased  difficulty flexing at R hip per presentation of circumduction and maintaining R LE in extension. Patient then with 6lb ankle weight on R LE in order to promote increased neuromuscular activation on hip flexors - Patient then ambulated about 40' with no AD (rehab tech close by for safety and WC follow) and min/modA on R for R LE advancement. Patient presented with increased efforts to bring R hip flexion in order to swing R LE through. Patient provided with cues to laterally lean to L, and to stand upright throughout. Patient then transported dependently to stairs for energy conservation.  - Patient ambulated about 8' from [facility] to recliner (prior to request for personal care) with notable flexion at R knee, and decreased circumduction.   Stair navigation: R AFO donned, and DF wrap (for leg lift) - totalA - ascending 4 (6") steps in main gym with mod/heavy heavy modA (rehab tech present with close supervision for safety throughout) for balance and R LE advancement to desired position on step. Patient presents with increased adductor tone on R LE after stepping up with L (R LE extends passed midline) and pt laterally leans to L to advance onto step (required maxA for a few steps to clear step per decreased hip/knee flexion on R). - descending 4 (6") steps in main gym with maxA to advance R LE first onto step with cues required for patient to keep upright posture per forward flexed presentation. Pt R LE  with increased efforts to avoid stepping passed midline when descending.    Wheelchair Mobility:  Pt propelled wheelchair about 150' from room to main gym using hemi-technique close supervision for safety 2/2 R sided neglect still present. Patient required VC to account for making R turns through turns as to avoid running into doorframe with R wheel.  Neuromuscular Re-ed: NMR facilitated during session with focus on proprioceptive feedback on B LE and postural alignment. - 5 x sit to stands hi/low mat (lowest)  with mirror feedback to keep centered alignment and promote equal WB on  B LE. Patient with CGA for safety and mulitmodal cues to adjust weight to center. - 8 x sit to stand with L LE on 2" step in order to increase R musculature activation on R LE. Patient performed with CGA for safety, with cues to anteriorly scoot initially and to control descent.   NMR performed for improvements in motor control and coordination, balance, sequencing, judgement, and self confidence/ efficacy in performing all aspects of mobility at highest level of independence.   Patient required voiding of BM at end of session. Patient transported to bathroom in [facility] and handed off to NT for personal care.   SESSION 2 Skilled Therapeutic Interventions/Progress Updates: Patient in main gym at beginning of session with rehab tech. Patient alert and agreeable to PT session.   Patient reported no pain or complaints at beginning of session.  Therapeutic Activity: Bed Mobility: Pt performed sit to supine from EOB with modA/heavy modA for R LE advancement 2/2 reports of increased fatigue. Transfers: Pt performed sit<>stand/pivot transfers throughout session with CGA (minA for stand pivot for safety due to fatigue reports at end of session). Provided VC for anterior scoot and use of L UE to push off WC instead of pulling on RW.  - Pt performed stand pivot with use of hand rail on wall by patient's bathroom toilet with CGA/light minA. Patient noted to slide R LE posteriorly to advance towards toilet. PTA provided posterior pericare with noted BM (NT alerted). Patient demonstrated static stance with CGA for safety, and totalA for personal care and doffing/donning brief/personal shorts.   Gait Training: R AFO donned - totalA Pt ambulated 40' x 1, 20' x 2 using RW with minA/light minA for balance safety and RW management (pt required increased cues to keep RW in safe proximity, especially when turning). Pt ambulated 40' to stairs in main  gym with rehab tech close by for safety and PTA on R side for maintaining balance (focus was primarily on R LE advancement without aid). Patient advanced R LE with at least 50% of the steps being circumduction on R LE with decreased flexion at R knee and hip. 2nd gait trial (after personal care and stair navigation) performed in main gym with PTA using yellow theraband around R thigh (pulling into extension) in order to promote tactile cuing for R hip flexion. VC provided were to have patient flex hips towards front of RW. Patient required min/modA to perform task, and patient with cues to correct left anterior lean. Patient required rest break and reported to attempt one more gait trial back to [facility] due to increased fatigue. Patient had notable increase in R LE hip/knee flexion with multiple steps, but was not able to maintain. Patient rested in [facility] and transported back to room dependently for time management and energy conservation.   Stair Navigation: DF wrap donned (for leg lift) and R AFO - totalA - ascending 4 (6") steps with  mod/heavy modA (rehab tech with yellow theraband on R thigh pulling into extension in order to promote hip flexion). PTA provided assistance with maintaining balance as the focus was to increase R hip/knee flexion, and to bring R LE onto step per pt presentation of increased difficulty to bring R foot fully onto step. Pt with noted slight hip flexion after 1st step, and decreased adduction passed midline with extension.  - descending 4 (6") steps with modA (rehab tech with yellow theraband on R thigh pulling into extension in order to promote hip flexion). Patient required modA at first to advance R LE off step to descend, and then progressed to minA with increased tactile cues to contract R hip flexors. Pt present this time as well with R LE favoring adduction towards/passed midline with modA to keep neutral BOS. Pt reported incontinence and was transported to room dependently for  pericare.   Patient supine in bed at end of session with brakes locked, bed alarm set, and all needs within reach.       Therapy Documentation Precautions:  Precautions Precautions: Fall Precaution Comments: R hemi; dysarthria Restrictions Weight Bearing Restrictions: Yes  Therapy/Group: Individual Therapy  Javeon Macmurray PTA 07/22/2022, 4:16 PM

## 2022-07-22 NOTE — Progress Notes (Signed)
Patient ID: Jimmy Valentine, male   DOB: 1950/05/29, 72 y.o.   MRN: [id number]  Team Conference Report to Patient/Family  Team Conference discussion was reviewed with the patient and caregiver, including goals, any changes in plan of care and target discharge date.  Patient and caregiver express understanding and are in agreement.  The patient has a target discharge date of 07/30/22.  SW met with patient and provided team conference updates. Patient inquiring about [facility] and services and inquiring if insurance can cover services. Pt and SW discussed OP and HH services, patient will like to consider his decision for FU and follow up with SW. Patient aware of the WC eval taking place this week and AFO received yesterday. No additional questions or concerns.   Andria Rhein 07/22/2022, 2:43 PM

## 2022-07-22 NOTE — Progress Notes (Signed)
PROGRESS NOTE   Subjective/Complaints:  OT has noted trace thumb flexion yesterday    ROS: Patient denies CP, SOB, N/V/D, abdominal pain, cough, new changes in motor or sensory function  Objective:   No results found. Recent Labs    07/20/22 0610  WBC 8.6  HGB 14.9  HCT 41.1  PLT 246     Recent Labs    07/20/22 0610  NA 134*  K 3.9  CL 99  CO2 24  GLUCOSE 129*  BUN 21  CREATININE 1.04  CALCIUM 8.9      Intake/Output Summary (Last 24 hours) at 07/22/2022 0806 Last data filed at 07/22/2022 0500 Gross per 24 hour  Intake 357 ml  Output 2150 ml  Net -1793 ml         Physical Exam: Vital Signs Blood pressure 123/77, pulse 71, temperature 98.6 F (37 C), temperature source Oral, resp. rate 16, height 5\' 9"  (1.753 m), weight 97 kg, SpO2 96 %.   General: No acute distress.  Laying in bed.  AFO at the bedside Mood and affect are appropriate Heart: Regular rate and rhythm no rubs murmurs or extra sounds Lungs:CTA Bilateral , non-labored Abdomen: Positive bowel sounds, soft nontender to palpation, nondistended Extremities: No clubbing, cyanosis, or edema. + R middle DIP amputation   Skin: No evidence of breakdown, no evidence of rash Neurologic: No apparent word finding deficits. 0/5 right UE except trace thumb flexion and protraction  3-/5 hip flexor, 3- knee extensors, 1/5 ankle dorsiflexor and 2- plantar flexor. Extensor tone RLE with PROM still present  Hypotonic right upper extremity Sensory exam normal sensation to light touch  in bilateral upper and lower extremities Cerebellar exam weakness on right precludes testing  Musculoskeletal: Full range of motion in all 4 extremities. Mild erythema and jt swelling RIght index MCP with TTP , no wrist synovitis or pain with ROM, no swelling in ankles or pain with ankle ROM      Assessment/Plan: 1. Functional deficits which require 3+ hours per day of  interdisciplinary therapy in a comprehensive inpatient rehab setting. Physiatrist is providing close team supervision and 24 hour management of active medical problems listed below. Physiatrist and rehab team continue to assess barriers to discharge/monitor patient progress toward functional and medical goals  Care Tool:  Bathing    Body parts bathed by patient: Abdomen, Chest, Right arm, Front perineal area, Right upper leg, Left upper leg, Face, Left arm, Left lower leg, Right lower leg   Body parts bathed by helper: Left arm, Buttocks, Right lower leg, Left lower leg     Bathing assist Assist Level: Minimal Assistance - Patient > 75%     Upper Body Dressing/Undressing Upper body dressing   What is the patient wearing?: Pull over shirt    Upper body assist Assist Level: Supervision/Verbal cueing    Lower Body Dressing/Undressing Lower body dressing      What is the patient wearing?: Incontinence brief, Pants     Lower body assist Assist for lower body dressing: Moderate Assistance - Patient 50 - 74%     Toileting Toileting    Toileting assist Assist for toileting: Moderate Assistance - Patient 50 -  74%     Transfers Chair/bed transfer  Transfers assist  Chair/bed transfer activity did not occur: Safety/medical concerns  Chair/bed transfer assist level: Minimal Assistance - Patient > 75%     Locomotion Ambulation   Ambulation assist   Ambulation activity did not occur: Safety/medical concerns  Assist level: Moderate Assistance - Patient 50 - 74%   Max distance: 70   Walk 10 feet activity   Assist  Walk 10 feet activity did not occur: Safety/medical concerns  Assist level: Moderate Assistance - Patient - 50 - 74% Assistive device: Walker-rolling (R hemi-grip)   Walk 50 feet activity   Assist Walk 50 feet with 2 turns activity did not occur: Safety/medical concerns         Walk 150 feet activity   Assist Walk 150 feet activity did not  occur: Safety/medical concerns         Walk 10 feet on uneven surface  activity   Assist Walk 10 feet on uneven surfaces activity did not occur: Safety/medical concerns         Wheelchair     Assist Is the patient using a wheelchair?: Yes Type of Wheelchair: Manual    Wheelchair assist level: Minimal Assistance - Patient > 75% Max wheelchair distance: 50 ft    Wheelchair 50 feet with 2 turns activity    Assist        Assist Level: Dependent - Patient 0%   Wheelchair 150 feet activity     Assist      Assist Level: Dependent - Patient 0%   Blood pressure 123/77, pulse 71, temperature 98.6 F (37 C), temperature source Oral, resp. rate 16, height 5\' 9"  (1.753 m), weight 97 kg, SpO2 96 %.  Medical Problem List and Plan: 1. 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 Large basal ganglia infarct. 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  Minimal RUE return ~74mo post CVA              -patient may  shower             -ELOS/Goals: extended 6/27             -Continue WHO and PRAFO             -Continue CIR therapies including PT, OT, and SLP   -AFO -at bedside  2.  Antithrombotics: -DVT/anticoagulation:  Pharmaceutical: Lovenox 4omg daily              -antiplatelet therapy: Aspirin and Plavix for three months followed by aspirin alone (started 5/20)   3. Pain Management: Tylenol, Robaxin as needed   4. Mood/Behavior/Sleep: LCSW to evaluate and provide emotional support             -antipsychotic agents: n/a             -trazodone as needed   5. Neuropsych/cognition: This patient is capable of making decisions on his own behalf.   6. Skin/Wound Care: Routine skin care checks   7. Fluids/Electrolytes/Nutrition: Routine Is and Os and follow-up chemistries             -continue dys3/advanced to thin liquids;  SLP following   8: Hypertension: monitor TID and prn (home meds: amlodipine 10 mg QD, valsartan 320 mg daily)             -continue irbesartan 37.5 mg daily  07/22/2022    4:36 AM 07/21/2022    8:04 PM 07/21/2022    3:43 PM  Vitals with BMI  Systolic 123 144 161  Diastolic 77 77 71  Pulse 71 81 78     -6/2   irbesartan increased to 75mg , BP now controlled 6/7 6/17   9: Hyperlipidemia: continue high intensity statin, change simvastatin to atorvastatin 80mg  daily   10: Hyponatremia,Na+ fluctuating , Na+ down to 129 from 136 is off FR will resume        Latest Ref Rng & Units 07/20/2022    6:10 AM 07/13/2022    6:40 AM 07/06/2022    7:03 AM  BMP  Glucose 70 - 99 mg/dL 096  045  409   BUN 8 - 23 mg/dL 21  16  13    Creatinine 0.61 - 1.24 mg/dL 8.11  9.14  7.82   Sodium 135 - 145 mmol/L 134  133  131   Potassium 3.5 - 5.1 mmol/L 3.9  3.9  3.8   Chloride 98 - 111 mmol/L 99  101  100   CO2 22 - 32 mmol/L 24  22  22    Calcium 8.9 - 10.3 mg/dL 8.9  9.1  9.0     11: History of gout: resolved off colchicine   12: DM type II: HGB A1C 6.7, CBGs QID and SSI; carb  modified diet when advanced (home meds include metformin 1000 mg BID) CBG (last 3)  Recent Labs    07/21/22 1143 07/21/22 1656 07/22/22 0709  GLUCAP 124* 112* 125*   Controlled 6/19   13: RLE/RUE weakness:normal LE  venous duplex   14: Tobacco use: occasional cigars, advise cessation   15. Constipation             -resolved. Had bm   type 5 6/1  -only on prn senokot-s and sorbitol   - LBM 6/15, having regular bowel movements LOS: 28 days A FACE TO FACE EVALUATION WAS PERFORMED  Erick Colace 07/22/2022, 8:06 AM

## 2022-07-22 NOTE — Progress Notes (Signed)
Occupational Therapy Session Note  Patient Details  Name: Jimmy Valentine MRN: [id number] Date of Birth: 06-01-1950  Today's Date: 07/22/2022 OT Individual Time: 0801-0900 OT Individual Time Calculation (min): 59 min    Short Term Goals: Week 4:  OT Short Term Goal 1 (Week 4): Pt will be able to complete stand pivot transfers with RW w/c to toilet and back with CGA. OT Short Term Goal 2 (Week 4): Pt will be able to complete stand pivot transfers with RW w/c to shower bench and back with CGA. OT Short Term Goal 3 (Week 4): Pt will don a shirt with supervision. OT Short Term Goal 4 (Week 4): pt will be able to don R leg into pants with min A. OT Short Term Goal 5 (Week 4): Pt will be able to actively move R arm on and off his lap to demonstrate increased shoulder control .  Skilled Therapeutic Interventions/Progress Updates:   Pt seen for full am ADL session with focus on toileting, shower, dressing, grooming and R sided NMRE. Improved demo for SPT to and from r/l sides depending on challenge. OOB to w/c, w/c to and from commode, in and out of stall shower to TTB back to w/c with set up cues but CGA consistency. When performing self supported standing, pt requires A for bowel hygiene, buttocks bathing and LB garment mngt pulling up and down over hips. Otherwise moving toward S for UB dressing and bathing and mod- fading to min A excluding socks, shoes and AFO for LB self care. Care coord with MD who briefly assessed pt with pt demo improved scapular retraction and intermittent spontaneous prox and distal active flexor movement but fleeting. Saebo StimOne application for elbow flexors, wrist flexors and extensors.  See below for Saebo parameters and response. Left pt up in w/c with 1/2 lap tray, needs and nurse call button in place.   Saebo StimOne parameters:    no adverse skin reactions.  330 pulse width 35 Hz pulse rate On 8 sec/ off 8 sec Ramp up/ down 2 sec Symmetrical Biphasic wave form   Max intensity at 500 Ohm load  Pain:  denies this am    Therapy Documentation Precautions:  Precautions Precautions: Fall Precaution Comments: R hemi; dysarthria Restrictions Weight Bearing Restrictions: Yes   Therapy/Group: Individual Therapy  Vicenta Dunning 07/22/2022, 8:00 AM

## 2022-07-22 NOTE — Patient Care Conference (Signed)
Inpatient RehabilitationTeam Conference and Plan of Care Update Date: 07/22/2022   Time: 10:23 AM    Patient Name: Jimmy Valentine      Medical Record Number: [id number]  Date of Birth: 1950/09/26 Sex: Male         Room/Bed: 4W20C/4W20C-01 Payor Info: Payor: [employer] MEDICARE / Plan: [employer] MEDICARE HMO/PPO / Product Type: *No Product type* /    Admit Date/Time:  06/24/2022  4:57 PM  Primary Diagnosis:  Acute ischemic left MCA stroke [facility])  Hospital Problems: Principal Problem:   Acute ischemic left MCA stroke [facility]) Active Problems:   Aphasia due to acute cerebrovascular accident (CVA) [facility])   Hyponatremia    Expected Discharge Date: Expected Discharge Date: 07/30/22  Team Members Present: Physician leading conference: Dr. Claudette Laws Social Worker Present: Lavera Guise, BSW Nurse Present: Chana Bode, RN PT Present: Ralph Leyden, PT OT Present: Bonnell Public, OT SLP Present: Other (comment) Teofilo Pod SLP) PPS Coordinator present : Fae Pippin, SLP     Current Status/Progress Goal Weekly Team Focus  Bowel/Bladder   Continent of B/B. LBM 07/21/22   Maintain Continence   Assist with tioleting Needs    Swallow/Nutrition/ Hydration   Goals met patient discharged 6/11   Sup A       ADL's   ktape assisting as well as supregear strap for R sublux mngt, mod approaching min A from w/c to toilet, tub bench and recliner via SPT with bar and adapted RW, given CGA support- pants mngt with min A after toileting, set up UB self care with hemi techniques, min A fading to CGA for seated LB bathing   Min A overall   ongoing prep for d/c home next week with family and pt educ, w/c eval completed, loaner for delivery next Tues, some AROM noted R elbow and hand flexors after estim!    Mobility   Bed mobility = minA; squat pivot = CGA, sit<>stand = minA (pt can statically stand without UE support and with close supervision for safety - about 1 minute at least). Ambulation using  RW with hand splint= MinA for safety/ balance and RLE management as pt requires to lean hevily to L in order to advance R LE through swing phase (improving advancement with new AFO); WC mobility = supervision - getting custom w/c   currently set to MinA overall transfers & gait, supervision w/c level  Barriers: RLE hemi with R LE flexor tone, RUE flaccid hemi; standing balance, safety awareness /// Weekly focus: improving LOA, continued R hemibody NMR, ambulation, balance, overall strength, stair navigation    Communication   Goals met patient discharged 6/11   supervision        Safety/Cognition/ Behavioral Observations               Pain   Denies pain   Remain pain free   Assess Q4 and prn    Skin   Skin Intact   Maintain Integrity  Assess QS ans prn      Discharge Planning:  Patient extended 1 week to obtain MOD I ADL goals. Patient family education complete.   Team Discussion: Patient with HTN (controlled), gout flare(resolved) and off fluid restrictions; with right shoulder subluxation post left MCA CVA.  Patient on target to meet rehab goals: yes, currently needs min assist for bathing and, set up for upper body care and  CGA - min assist for lower body dressing.  Needs min assist for sit - stand, but needs mod assist  for wheelchair - toilet transfers.   *See Care Plan and progress notes for long and short-term goals.   Revisions to Treatment Plan:  Wheelchair eval E-stim AFO consult   Teaching Needs: Safety, medications, dietary modifications, transfers, toileting, etc.   Current Barriers to Discharge: Decreased caregiver support and Home enviroment access/layout  Possible Resolutions to Barriers: Family education HH follow up services; OP services would be ideal DME: Drop arm BSC, TTB, RW and wheelchair     Medical Summary Current Status: pt with trace movement RIght upper ext , BP controlled  Barriers to Discharge: Other (comments)  Barriers to  Discharge Comments: minimal improvement with RUE strength , mild aphasia Possible Resolutions to [employer] Focus: family training   Continued Need for Acute Rehabilitation Level of Care: The patient requires daily medical management by a physician with specialized training in physical medicine and rehabilitation for the following reasons: Direction of a multidisciplinary physical rehabilitation program to maximize functional independence : Yes Medical management of patient stability for increased activity during participation in an intensive rehabilitation regime.: Yes Analysis of laboratory values and/or radiology reports with any subsequent need for medication adjustment and/or medical intervention. : Yes   I attest that I was present, lead the team conference, and concur with the assessment and plan of the team.   Chana Bode B 07/22/2022, 2:08 PM

## 2022-07-23 DIAGNOSIS — I69391 Dysphagia following cerebral infarction: Secondary | ICD-10-CM

## 2022-07-23 LAB — GLUCOSE, CAPILLARY
Glucose-Capillary: 123 mg/dL — ABNORMAL HIGH (ref 70–99)
Glucose-Capillary: 136 mg/dL — ABNORMAL HIGH (ref 70–99)
Glucose-Capillary: 122 mg/dL — ABNORMAL HIGH (ref 70–99)
Glucose-Capillary: 137 mg/dL — ABNORMAL HIGH (ref 70–99)

## 2022-07-23 NOTE — Progress Notes (Signed)
Occupational Therapy Session Note  Patient Details  Name: Jimmy Valentine MRN: [id number] Date of Birth: 1951/01/26  Today's Date: 07/23/2022 OT Individual Time: 0830-0930 OT Individual Time Calculation (min): 60 min    Short Term Goals: Week 1:  OT Short Term Goal 1 (Week 1): Pt will be able to sit EOB with close S and don shirt with mod A or less. OT Short Term Goal 1 - Progress (Week 1): Met OT Short Term Goal 2 (Week 1): Pt will be able to sit to stand from EOB with max A of 1 to  prep for LB dressing. OT Short Term Goal 2 - Progress (Week 1): Met OT Short Term Goal 3 (Week 1): Pt will complete squat pivot to toilet with max A of 1. OT Short Term Goal 3 - Progress (Week 1): Met OT Short Term Goal 4 (Week 1): Pt will be able to perform gentle self ROM on RUE with min A. OT Short Term Goal 4 - Progress (Week 1): Met Week 2:  OT Short Term Goal 1 (Week 2): Pt will complete toilet transfers with min A or less. OT Short Term Goal 1 - Progress (Week 2): Met OT Short Term Goal 2 (Week 2): Pt will complete squat pivot transfers to tub bench in walk in shower with min A or less. OT Short Term Goal 2 - Progress (Week 2): Progressing toward goal OT Short Term Goal 3 (Week 2): Pt will don shirt with min A or less. OT Short Term Goal 3 - Progress (Week 2): Met OT Short Term Goal 4 (Week 2): Pt will don pants with mod A or less. OT Short Term Goal 4 - Progress (Week 2): Met OT Short Term Goal 5 (Week 2): Pt will be able to hold stand balance with min A while he uses his L hand to adjust pants over hips. OT Short Term Goal 5 - Progress (Week 2): Met Week 3:  OT Short Term Goal 1 (Week 3): STGs = LTGs OT Short Term Goal 1 - Progress (Week 3): Progressing toward goal Week 4:  OT Short Term Goal 1 (Week 4): Pt will be able to complete stand pivot transfers with RW w/c to toilet and back with CGA. OT Short Term Goal 2 (Week 4): Pt will be able to complete stand pivot transfers with RW w/c to shower  bench and back with CGA. OT Short Term Goal 3 (Week 4): Pt will don a shirt with supervision. OT Short Term Goal 4 (Week 4): pt will be able to don R leg into pants with min A. OT Short Term Goal 5 (Week 4): Pt will be able to actively move R arm on and off his lap to demonstrate increased shoulder control .  Skilled Therapeutic Interventions/Progress Updates:    Pt received in bed ready for therapy. Pt stated he had called for assistance 35 minutes ago as he needed to toilet.  Focus of therapy session on ADL transfers. Due to bathroom urgency, pt completed a squat pivot bed to w/c with close supervision and then to toilet with stand pivot with use of bar w/c to toilet with CGA. Pt worked on sit to stands several time for cleansing and Acupuncturist with light CGA and able to hold balance with CGA to min A while he used L hand to cleanse self and pull up pants with R hand on walker splint.   He then practiced a stand pivot using his RW to w/c  with CGA. Cues to sit to stand first, then place hand on walker splint and then to remove hand from splint before sitting down.   Pt taken to tub room to practice walking from outside bathroom into bathroom and completing tub transfers to simulate what he will need to do at home.  Pt able to complete all mobility with CGA to light min A, except for total A to bring R leg over tub wall. Tried to have him use a gait belt as a sling for R foot but the angle of pull was not allowing him to use his L hand to lift R foot.  He is improving with his foot placement and alternating feet vs taking too many steps with his left foot.   Cues to ensure his center of gravity is stable prior to stepping to side or back wards   Pt returned to his room.  Pt resting in w/c with call light in reach. Alarm not needed.    Therapy Documentation Precautions:  Precautions Precautions: Fall Precaution Comments: R hemi; dysarthria Restrictions Weight Bearing Restrictions:  Yes      Pain: Pain Assessment Pain Score: 0-No pain ADL: ADL Eating: Set up Grooming: Supervision/safety Where Assessed-Grooming: Standing at sink Upper Body Bathing: Supervision/safety (using long sponge to reach L arm) Where Assessed-Upper Body Bathing: Shower Lower Body Bathing: Supervision/safety (CGA to support balance as pt washed LB) Where Assessed-Lower Body Bathing: Shower Upper Body Dressing: Minimal assistance, Minimal cueing Where Assessed-Upper Body Dressing: Wheelchair Lower Body Dressing:  (min A for pants, max for shoes with AFO) Where Assessed-Lower Body Dressing: Wheelchair Toileting: Minimal assistance Where Assessed-Toileting: Toilet (elevated seat with BSC over toilet) Toilet Transfer: Minimal assistance Toilet Transfer Method: Stand pivot Toilet Transfer Equipment: Raised toilet seat, Grab bars Film/video editor: Minimal assistance Film/video editor Method: Warden/ranger: Emergency planning/management officer, Grab bars ADL Comments: .  Therapy/Group: Individual Therapy  Darlean Warmoth 07/23/2022, 12:44 PM

## 2022-07-23 NOTE — Progress Notes (Signed)
Physical Therapy Session Note  Patient Details  Name: Jimmy Valentine MRN: [id number] Date of Birth: 10/22/1950  Today's Date: 07/23/2022 PT Individual Time: 1416-1530 PT Individual Time Calculation (min): 74 min   Short Term Goals: Week 5:     Skilled Therapeutic Interventions/Progress Updates: Pt presents supine in bed and agreeable to therapy.  Pt transfers sup to sit w/ min A, bringing LES off EOB and then assisted to achieve upright stance final 1/3 of transfer.  PT donned R AFO and shoes w/ total A.  Pt transfers sit to stand w/ min A and step-pivot bed > w/c w/ min A.  Pt wheeled to outdoors near [facility].  Pt amb x 12' w/ RW to bench w/ min A and tactile cues for weight shift for RLE advancement.  Pt amb w/ RW and min A on uneven surfaces, including turns to return to bench and then to w/c up to 40'.  Pt returned to main gym.  Pt performed blocks of sit to stand transfers w/o UE assist and blocking R knee .  Pt performed w/ 1 3/4" platform under L foot.  Pt occ. requires 2nd attempt to stand and then cues for forward lean.  Pt amb w/ L HHA and min A for amb to bed x 12' and cueing.  Pt required min A for sit to supine.  Pt required A for urinal placement.  Pt continent of 200 ml bladder, charted in Flowsheets.  Bed alarm on and all needs in reach.     Therapy Documentation Precautions:  Precautions Precautions: Fall Precaution Comments: R hemi; dysarthria Restrictions Weight Bearing Restrictions: Yes General:   Vital Signs: Therapy Vitals Temp: 97.8 F (36.6 C) Temp Source: Oral Pulse Rate: 81 Resp: 16 BP: (!) 152/74 Patient Position (if appropriate): Sitting Oxygen Therapy SpO2: 97 % Pain:0/10 Pain Assessment Pain Score: 0-No pain    Therapy/Group: Individual Therapy  [name] 07/23/2022, 3:49 PM

## 2022-07-23 NOTE — Progress Notes (Signed)
   Slept well last night. Requested trazodone prn. Denies pain. Remains alert and oriented X4. VS stable       Prafo boots and hand brace  overnight. Safety maintained at all times.

## 2022-07-23 NOTE — Progress Notes (Signed)
PROGRESS NOTE   Subjective/Complaints:  Pt with OT, needs to go to BR. Had a reasonable night. Slept well with trazodone. Some new movement in delt and perhaps right thumb?  ROS: Patient denies fever, rash, sore throat, blurred vision, dizziness, nausea, vomiting, diarrhea, cough, shortness of breath or chest pain, joint or back/neck pain, headache, or mood change.   Objective:   No results found. No results for input(s): "WBC", "HGB", "HCT", "PLT" in the last 72 hours.  No results for input(s): "NA", "K", "CL", "CO2", "GLUCOSE", "BUN", "CREATININE", "CALCIUM" in the last 72 hours.   Intake/Output Summary (Last 24 hours) at 07/23/2022 0909 Last data filed at 07/23/2022 0734 Gross per 24 hour  Intake 710 ml  Output 801 ml  Net -91 ml        Physical Exam: Vital Signs Blood pressure 133/81, pulse 78, temperature 97.6 F (36.4 C), temperature source Oral, resp. rate 18, height 5\' 9"  (1.753 m), weight 97.8 kg, SpO2 95 %.   Constitutional: No distress . Vital signs reviewed. HEENT: NCAT, EOMI, oral membranes moist Neck: supple Cardiovascular: RRR without murmur. No JVD    Respiratory/Chest: CTA Bilaterally without wheezes or rales. 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 Neurologic: No apparent word finding deficits. 0/5 right UE except trace shoulder protraction. Did not see any finger movement today.  3-/5 hip flexor, 3- knee extensors, 1/5 ankle dorsiflexor and 2- plantar flexor. Extensor tone RLE with PROM still present  Hypotonic right upper extremity Sensory exam normal sensation to light touch  in bilateral upper and lower extremities Cerebellar exam weakness on right precludes testing  Musculoskeletal: Full range of motion in all 4  extremities. Mild erythema and jt swelling RIght index MCP with TTP , no wrist synovitis or pain with ROM, no swelling in ankles or pain with ankle ROM      Assessment/Plan: 1. Functional deficits which require 3+ hours per day of interdisciplinary therapy in a comprehensive inpatient rehab setting. Physiatrist is providing close team supervision and 24 hour management of active medical problems listed below. Physiatrist and rehab team continue to assess barriers to discharge/monitor patient progress toward functional and medical goals  Care Tool:  Bathing    Body parts bathed by patient: Abdomen, Chest, Right arm, Front perineal area, Right upper leg, Left upper leg, Face, Left arm, Left lower leg, Right lower leg   Body parts bathed by helper: Left arm, Buttocks, Right lower leg, Left lower leg     Bathing assist Assist Level: Minimal Assistance - Patient > 75%     Upper Body Dressing/Undressing Upper body dressing   What is the patient wearing?: Pull over shirt    Upper body assist Assist Level: Supervision/Verbal cueing    Lower Body Dressing/Undressing Lower body dressing      What is the patient wearing?: Incontinence brief, Pants     Lower body assist Assist for lower body dressing: Moderate Assistance - Patient 50 - 74%     Toileting Toileting  Toileting assist Assist for toileting: Moderate Assistance - Patient 50 - 74%     Transfers Chair/bed transfer  Transfers assist  Chair/bed transfer activity did not occur: Safety/medical concerns  Chair/bed transfer assist level: Minimal Assistance - Patient > 75%     Locomotion Ambulation   Ambulation assist   Ambulation activity did not occur: Safety/medical concerns  Assist level: Moderate Assistance - Patient 50 - 74%   Max distance: 70   Walk 10 feet activity   Assist  Walk 10 feet activity did not occur: Safety/medical concerns  Assist level: Moderate Assistance - Patient - 50 -  74% Assistive device: Walker-rolling (R hemi-grip)   Walk 50 feet activity   Assist Walk 50 feet with 2 turns activity did not occur: Safety/medical concerns         Walk 150 feet activity   Assist Walk 150 feet activity did not occur: Safety/medical concerns         Walk 10 feet on uneven surface  activity   Assist Walk 10 feet on uneven surfaces activity did not occur: Safety/medical concerns         Wheelchair     Assist Is the patient using a wheelchair?: Yes Type of Wheelchair: Manual    Wheelchair assist level: Minimal Assistance - Patient > 75% Max wheelchair distance: 50 ft    Wheelchair 50 feet with 2 turns activity    Assist        Assist Level: Dependent - Patient 0%   Wheelchair 150 feet activity     Assist      Assist Level: Dependent - Patient 0%   Blood pressure 133/81, pulse 78, temperature 97.6 F (36.4 C), temperature source Oral, resp. rate 18, height 5\' 9"  (1.753 m), weight 97.8 kg, SpO2 95 %.  Medical Problem List and Plan: 1. 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 Large basal ganglia infarct. 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  Minimal RUE return ~65mo post CVA              -patient may  shower             -ELOS/Goals: extended 6/27 due to needing to ambulate in bathrooms             -Continue WHO and PRAFO             -Continue CIR therapies including PT, OT, and SLP    -AFO -at bedside  2.  Antithrombotics: -DVT/anticoagulation:  Pharmaceutical: Lovenox 4omg daily              -antiplatelet therapy: Aspirin and Plavix for three months followed by aspirin alone (started 5/20)   3. Pain Management: Tylenol, Robaxin as needed   4. Mood/Behavior/Sleep: LCSW to evaluate and provide emotional support             -antipsychotic agents: n/a              -trazodone as needed   5. Neuropsych/cognition: This patient is capable of making decisions on his own behalf.   6. Skin/Wound Care: Routine skin care checks   7. Fluids/Electrolytes/Nutrition: Routine Is and Os and follow-up chemistries             -continue dys3/advanced to thin liquids; SLP following   8: Hypertension: monitor TID and prn (home meds: amlodipine 10 mg QD, valsartan 320 mg daily)             -  continue irbesartan 37.5 mg daily    07/23/2022    5:34 AM 07/23/2022    4:53 AM 07/22/2022    7:39 PM  Vitals with BMI  Weight 215 lbs 10 oz    BMI 31.83    Systolic  133 136  Diastolic  81 79  Pulse  78 77     -6/2   irbesartan increased to 75mg  6/20 bp controlled  9: Hyperlipidemia: continue high intensity statin, change simvastatin to atorvastatin 80mg  daily   10: Hyponatremia,Na+ fluctuating , Na+ down to 129 from 136 is off FR will resume        Latest Ref Rng & Units 07/20/2022    6:10 AM 07/13/2022    6:40 AM 07/06/2022    7:03 AM  BMP  Glucose 70 - 99 mg/dL 161  096  045   BUN 8 - 23 mg/dL 21  16  13    Creatinine 0.61 - 1.24 mg/dL 4.09  8.11  9.14   Sodium 135 - 145 mmol/L 134  133  131   Potassium 3.5 - 5.1 mmol/L 3.9  3.9  3.8   Chloride 98 - 111 mmol/L 99  101  100   CO2 22 - 32 mmol/L 24  22  22    Calcium 8.9 - 10.3 mg/dL 8.9  9.1  9.0     7/82 recheck sodium tomorrow. Continue 1500cc FR 11: History of gout: resolved off colchicine   12: DM type II: HGB A1C 6.7, CBGs QID and SSI; carb  modified diet when advanced (home meds include metformin 1000 mg BID) CBG (last 3)  Recent Labs    07/22/22 1713 07/22/22 2122 07/23/22 0618  GLUCAP 103* 153* 123*  Controlled 6/20   13: RLE/RUE weakness:normal LE  venous duplex   14: Tobacco use: occasional cigars, advise cessation   15. Constipation             -resolved. Had bm   type 5 6/1  -only on prn senokot-s and sorbitol   - LBM 6/19, having regular bowel movements LOS: 29 days A FACE TO FACE  EVALUATION WAS PERFORMED  Ranelle Oyster 07/23/2022, 9:09 AM

## 2022-07-23 NOTE — Progress Notes (Signed)
Physical Therapy Session Note  Patient Details  Name: Jimmy Valentine MRN: [id number] Date of Birth: 1950-10-19  Today's Date: 07/23/2022 PT Individual Time: 4098-1191 PT Individual Time Calculation (min): 57 min   Short Term Goals: Week 1:  PT Short Term Goal 1 (Week 1): Pt will complete bed mobility with modA PT Short Term Goal 1 - Progress (Week 1): Met PT Short Term Goal 2 (Week 1): Pt will complete bed<>chair transfers with maxA of 1 person PT Short Term Goal 2 - Progress (Week 1): Met PT Short Term Goal 3 (Week 1): Pt will maintain unsupported sitting balance for 30 seconds PT Short Term Goal 3 - Progress (Week 1): Met PT Short Term Goal 4 (Week 1): Pt will participate in functional outcome measure to assess falls risk PT Short Term Goal 4 - Progress (Week 1): Met Week 2:  PT Short Term Goal 1 (Week 2): Pt will complete bed mobility with MinA. PT Short Term Goal 1 - Progress (Week 2): Progressing toward goal PT Short Term Goal 2 (Week 2): Pt will complete bed<>chair transfers with ModA. PT Short Term Goal 2 - Progress (Week 2): Met PT Short Term Goal 3 (Week 2): pt will maintain unsupported sitting balance to intermittent CGA. PT Short Term Goal 3 - Progress (Week 2): Met PT Short Term Goal 4 (Week 2): Pt will ambulate at least 75 ft using RW with MinA. PT Short Term Goal 4 - Progress (Week 2): Progressing toward goal PT Short Term Goal 5 (Week 2): pt will initiate stair training. PT Short Term Goal 5 - Progress (Week 2): Met Week 3:  PT Short Term Goal 1 (Week 3): Pt will perform supine<>sit with min assist from L EOB PT Short Term Goal 1 - Progress (Week 3): Met PT Short Term Goal 2 (Week 3): Pt will perform sit<>stands using LRAD with min assist PT Short Term Goal 2 - Progress (Week 3): Met PT Short Term Goal 3 (Week 3): Pt will perform bed<>chair transfers using LRAD with min assist PT Short Term Goal 3 - Progress (Week 3): Met PT Short Term Goal 4 (Week 3): Pt will  ambulate at least 15ft using LRAD with min assist PT Short Term Goal 4 - Progress (Week 3): Partly met (64' with min/mod A) PT Short Term Goal 5 (Week 3): Pt will navigate 4 steps using HRs with mod assist of 1 PT Short Term Goal 5 - Progress (Week 3): Progressing toward goal Week 4:  PT Short Term Goal 1 (Week 4): STG = LTG d/t ELOS  Skilled Therapeutic Interventions/Progress Updates: Patient in [facility] on entrance to room. Patient alert and agreeable to PT session.   Patient reported no pain at beginning of session  Therapeutic Activity: Bed Mobility: Pt performed sit to supine from EOB with minA to elevate R LE onto bed. Tactile cues for quad/hip musculature activation.  Transfers: Pt performed sit<>stand transfers throughout session with CGA/light minA. Provided VC for anterior scoot and to ensure placement of R LE is in a good standing position. Patient performed stand pivot from [facility] to EOB with RW and required minA for safety.   Gait Training:  Pt ambulated 24' using RW with min/light modA in hallway outside main gym with [facility] follow for safety. Patient presented at first with increased effort/difficulty to advance R LE with minA to facilitate R anterior hip shift. Patient cued to bring R knee up to try to hit PTA had to encourage R hip flexion. Patient  ambulated several more feet and then notable R hip/knee flexion (slight) with cues required to avoid hitting R posterior leg of RW (favors circumduction). Patient then required a rest break and was transported back to room dependently for time management.  Stair navigation: R AFO donned, and DF wrap (for leg lift) - totalA - ascending 4 (6") steps in main gym with modA for balance and R LE advancement to desired position on step. Rehab tech at this time with yellow theraband around patient's R thigh pulling into hip extension in order to promote R hip flexion. Patient presents with slightly increased adductor tone on R LE after stepping up with L (R  LE extends passed midline). Patient cued to avoid excessive L lateral lean to avoid compensation strategies (keeping R LE in extension). Patient performed task x 2 with notable hip flexion on last 2 steps with heavy minA provided. - descending 4 (6") steps in main gym with minA to advance R LE onto next step, but modA for proper placement per presentation of adductors pulling R LE towards midline. Patient required VC to avoid flexion at B knees prior to stepping down to next step. Patient required mod/heavy modA for balance and safety per tendency to forward flex at hips.   Neuromuscular Re-ed: NMR facilitated during session with focus on neuromuscular coordination/activation on R LE. - R hip Flexion to 2" step with yellow theraband around thigh pulling into hip extension in order to promote hip flexion/knee flexion during functional gait activities. Patient cued to flex at hips (and to avoid excessive leaning to L side as to decrease compensatory strategy of keeping R LE in full extension). Patient performed task until fatigue.  NMR performed for improvements in motor control and coordination, balance, sequencing, judgement, and self confidence/ efficacy in performing all aspects of mobility at highest level of independence.   Patient supine in bed at end of session with brakes locked, bed alarm set, and all needs within reach.      Therapy Documentation Precautions:  Precautions Precautions: Fall Precaution Comments: R hemi; dysarthria Restrictions Weight Bearing Restrictions: Yes  Therapy/Group: Individual Therapy  Katheren Jimmerson PTA 07/23/2022, 3:40 PM

## 2022-07-23 NOTE — Progress Notes (Addendum)
Patient ID: Jimmy Valentine, male   DOB: [birth year], 72 y.o.   MRN: [id number]  Daughter inquiring if family education will be required for her again. Sw following up with therapist and will FU with patient and daughter in the room.   1:40 PM: Sw met with patient an daughter to discuss family education and DME recommendations. Patient and daughter discussing additional family education and will confirm with SW. SW and family requesting DME recommendations by tomorrow in case the family decides to purchase bathroom DME privately. Family in discussion of HH VS. OP, no additional questions or concerns.

## 2022-07-24 ENCOUNTER — Other Ambulatory Visit: Payer: Self-pay | Admitting: Internal Medicine

## 2022-07-24 LAB — GLUCOSE, CAPILLARY
Glucose-Capillary: 125 mg/dL — ABNORMAL HIGH (ref 70–99)
Glucose-Capillary: 122 mg/dL — ABNORMAL HIGH (ref 70–99)
Glucose-Capillary: 107 mg/dL — ABNORMAL HIGH (ref 70–99)
Glucose-Capillary: 145 mg/dL — ABNORMAL HIGH (ref 70–99)

## 2022-07-24 LAB — BASIC METABOLIC PANEL
Anion gap: 10 (ref 5–15)
BUN: 19 mg/dL (ref 8–23)
CO2: 23 mmol/L (ref 22–32)
Calcium: 8.9 mg/dL (ref 8.9–10.3)
Chloride: 102 mmol/L (ref 98–111)
Creatinine, Ser: 1.02 mg/dL (ref 0.61–1.24)
GFR, Estimated: >60 mL/min (ref >60)
Glucose, Bld: 240 mg/dL — ABNORMAL HIGH (ref 70–99)
Potassium: 3.7 mmol/L (ref 3.5–5.1)
Sodium: 135 mmol/L (ref 135–145)

## 2022-07-24 NOTE — Progress Notes (Signed)
Patient ID: Jimmy Valentine, male   DOB: 03/27/1950, 72 y.o.   MRN: [id number]  Drop arm commode, Tub transfer bench and Rolling Walker ordered through [employer].

## 2022-07-24 NOTE — Progress Notes (Signed)
Physical Therapy Session Note  Patient Details  Name: Jimmy Valentine MRN: [id number] Date of Birth: 1951-01-01  Today's Date: 07/24/2022 PT Individual Time: 1303-1405 PT Individual Time Calculation (min): 62 min   Short Term Goals: Week 3:  PT Short Term Goal 1 (Week 3): Pt will perform supine<>sit with min assist from L EOB PT Short Term Goal 1 - Progress (Week 3): Met PT Short Term Goal 2 (Week 3): Pt will perform sit<>stands using LRAD with min assist PT Short Term Goal 2 - Progress (Week 3): Met PT Short Term Goal 3 (Week 3): Pt will perform bed<>chair transfers using LRAD with min assist PT Short Term Goal 3 - Progress (Week 3): Met PT Short Term Goal 4 (Week 3): Pt will ambulate at least 57ft using LRAD with min assist PT Short Term Goal 4 - Progress (Week 3): Partly met (64' with min/mod A) PT Short Term Goal 5 (Week 3): Pt will navigate 4 steps using HRs with mod assist of 1 PT Short Term Goal 5 - Progress (Week 3): Progressing toward goal Week 4:  PT Short Term Goal 1 (Week 4): STG = LTG d/t ELOS  Skilled Therapeutic Interventions/Progress Updates:  Patient seated upright in w/c on entrance to room. Patient alert and agreeable to PT session. Family member in room who is going to assist pt with ride home at staying for few days at d/c.   Short stretch wrap applied to pt's RUE as sling   Patient with no pain complaint at start of session.  Therapeutic Activity: Transfers: Pt performed sit<>stand transfers throughout session with improving awareness of need for R foot positioning more posteriorly and with foot flat prior to stance in order to improve safety and attainment of standing balance on rise to stand. With proper positioning, pt can rise to stand with close supervision. Without, then pt requires several attempts and up to MinA to attain balance. Stand pivot transfers throughout session require vc for slow, steady pivot stepping and minA for walker mgmt. Provided vc for  sequencing.  Gait Training/ NMR:  AFO already donned prior to tx session. Pt ambulated ~70' x1/ 77' x1 using RW with R hand saddle splint and CGA/ intermittent MinA for balance. Demonstrated improving RLE advancement with no physical assist required for advancement. Pt with decreased motor control for consistent foot placement and step length. Increased posterior lean with intermittent facilitation of forward hip advancement at initiation of swing phase. Slight improvement in hip/ knee flexion to advance RLE. Provided vc/ tc for technique throughout.  Guided in stair training using LHR to ascend and RHR to descend. Pt is able to ascend with CGA and brings R foot to each step with L lateral lean and hip hike. Vc for bringing entire foot to step prior to shifting weight over RLE to ascend with LLE. Utilizes handrails at top of steps to perform standing pivot. Inititally in descent, he is able to relate need to descend leading with RLE despite NMR practice earlier with PTA and leading with LLE. In descent, pt initially flexes Bil knees with attempt to bring RLE anteriorly to clear step. And requires MaxA to descend safely.   Cued pt to maintain knee extension bilaterally while clearing step with RLE - much like advancing LE in ambulation. Pt then able to more appropriately coordinate RLE step clearance with MinA and maintain knee extension while using LLE to descend. Requires MinA for improving foot placement on descent as pt biases toward adduction when lowering  to step. Performs x2 with improving assist of CGA to ascend and MinA to clear step with RLE on descent.   Next guided in curb step training using RW. Requires MinA for walker mgmt with RUE throughout. Pt guided in touch of RW to step and advancement of toes toward step. MinA for lift of RW to step. MinA for forward lean over LLE on step and ascending with use of RW. Clears step with RLE and CGA. Requires vc and close approach with RW to edge of step,  then encouragement to bring toes closer to edge prior to MinA for bringing RW down to floor level. Able to initiate R foot forward requiring MinA to fully clear, then can lower to step with LLE requiring overall MinA.   Discussion with pt to attempt ambulation with HW either tmrw or Monday to advance ambulation.   NMR performed for improvements in motor control and coordination, balance, sequencing, judgement, and self confidence/ efficacy in performing all aspects of mobility at highest level of independence.   Patient seated upright in w/c at end of session with brakes locked, seat pad alarm set, and all needs within reach.   Therapy Documentation Precautions:  Precautions Precautions: Fall Precaution Comments: R hemi; dysarthria Restrictions Weight Bearing Restrictions: Yes General:   Vital Signs:  Pain:    Therapy/Group: Individual Therapy  Loel Dubonnet PT, DPT, CSRS 07/24/2022, 6:53 PM

## 2022-07-24 NOTE — Progress Notes (Signed)
Occupational Therapy Weekly Progress Note  Patient Details  Name: Jimmy Valentine MRN: [id number] Date of Birth: 1950/07/22  Beginning of progress report period: July 17, 2022 End of progress report period: July 24, 2022  Today's Date: 07/24/2022 OT Individual Time: 3086-5784 OT Individual Time Calculation (min): 45 min    Patient has met 5 of 5 short term goals.  Pt has been making strong progress this week with his ambulation skills which has enabled him to be able to use RW to walk in and out of the bathroom. He will need to do this at home.  He needs min A to steady balance with ambulation and the goal his he will only need CGA.  He continues to develop balance control but needs to work on more control to safely reach his backside for cleansing post toileting. He can now lift his shoulder to get hand on and off hand splint on RW.   Patient continues to demonstrate the following deficits: decreased cardiorespiratoy endurance, abnormal tone, and decreased sitting balance, decreased standing balance, hemiplegia, and decreased balance strategies and therefore will continue to benefit from skilled OT intervention to enhance overall performance with BADL.  Patient progressing toward long term goals..  Continue plan of care. Plan for discharge this next week.  OT Short Term Goals Week 1:  OT Short Term Goal 1 (Week 1): Pt will be able to sit EOB with close S and don shirt with mod A or less. OT Short Term Goal 1 - Progress (Week 1): Met OT Short Term Goal 2 (Week 1): Pt will be able to sit to stand from EOB with max A of 1 to  prep for LB dressing. OT Short Term Goal 2 - Progress (Week 1): Met OT Short Term Goal 3 (Week 1): Pt will complete squat pivot to toilet with max A of 1. OT Short Term Goal 3 - Progress (Week 1): Met OT Short Term Goal 4 (Week 1): Pt will be able to perform gentle self ROM on RUE with min A. OT Short Term Goal 4 - Progress (Week 1): Met Week 2:  OT Short Term Goal 1  (Week 2): Pt will complete toilet transfers with min A or less. OT Short Term Goal 1 - Progress (Week 2): Met OT Short Term Goal 2 (Week 2): Pt will complete squat pivot transfers to tub bench in walk in shower with min A or less. OT Short Term Goal 2 - Progress (Week 2): Progressing toward goal OT Short Term Goal 3 (Week 2): Pt will don shirt with min A or less. OT Short Term Goal 3 - Progress (Week 2): Met OT Short Term Goal 4 (Week 2): Pt will don pants with mod A or less. OT Short Term Goal 4 - Progress (Week 2): Met OT Short Term Goal 5 (Week 2): Pt will be able to hold stand balance with min A while he uses his L hand to adjust pants over hips. OT Short Term Goal 5 - Progress (Week 2): Met Week 3:  OT Short Term Goal 1 (Week 3): STGs = LTGs OT Short Term Goal 1 - Progress (Week 3): Progressing toward goal Week 4:  OT Short Term Goal 1 (Week 4): Pt will be able to complete stand pivot transfers with RW w/c to toilet and back with CGA. OT Short Term Goal 1 - Progress (Week 4): Met OT Short Term Goal 2 (Week 4): Pt will be able to complete stand pivot transfers with  RW w/c to shower bench and back with CGA. OT Short Term Goal 2 - Progress (Week 4): Met OT Short Term Goal 3 (Week 4): Pt will don a shirt with supervision. OT Short Term Goal 3 - Progress (Week 4): Met OT Short Term Goal 4 (Week 4): pt will be able to don R leg into pants with min A. OT Short Term Goal 4 - Progress (Week 4): Met OT Short Term Goal 5 (Week 4): Pt will be able to actively move R arm on and off his lap to demonstrate increased shoulder control . OT Short Term Goal 5 - Progress (Week 4): Met Week 5:  OT Short Term Goal 1 (Week 5): STGs = LTGs  Skilled Therapeutic Interventions/Progress Updates:    Pt received in bed ready for therapy.  Focus of therapy session on functional mobility with ADLs and use of AE. -bed mobility - gave pt the option of trying compensatory technique using bed rail or the rolling we have  been practicing. Pt opted to try rail and he continues to need min -mod A. He agreed other strategy is easier for him to manage. Suggest he practice that this week.  -squat pivot with Supervision to w/c and then pt practiced using RW (without AFO on) to ambulate to bathroom shower with only min A!  Cued pt to step up slope the way he does a stair leading with his L leg.   -pt transferred to shower bench CGA and then used long sponge to reach feet and L arm.  Stood with light CGA as he washed his bottom -transferred back to w/c to dress -shirt set up only -used reacher to don pants over feet, pt stood and with CGA for support he pulled pants over hips -assisted pt with socks and set up at sink to complete oral care.  - pt resting in wc with all needs met and lap tray on      Therapy Documentation Precautions:  Precautions Precautions: Fall Precaution Comments: R hemi; dysarthria Restrictions Weight Bearing Restrictions: Yes Pain: Pain Assessment Pain Score: 0-No pain ADL: ADL Eating: Set up Grooming: Supervision/safety Where Assessed-Grooming: Standing at sink Upper Body Bathing: Supervision/safety (using long sponge to reach L arm) Where Assessed-Upper Body Bathing: Shower Lower Body Bathing: Supervision/safety (CGA to support balance as pt washed LB) Where Assessed-Lower Body Bathing: Shower Upper Body Dressing: Setup Where Assessed-Upper Body Dressing: Wheelchair Lower Body Dressing:  (min A for pants, max for shoes with AFO) Where Assessed-Lower Body Dressing: Wheelchair Toileting: Minimal assistance Where Assessed-Toileting: Toilet (elevated seat with BSC over toilet) Toilet Transfer: Furniture conservator/restorer Method: Stand pivot Acupuncturist: Raised toilet seat, Grab bars Tub/Shower Transfer: Scientific laboratory technician Method: Ship broker: Emergency planning/management officer, Acupuncturist: Minimal assistance Training and development officer Method: Warden/ranger: Emergency planning/management officer, Grab bars ADL Comments: .  Therapy/Group: Individual Therapy  Kamber Vignola 07/24/2022, 12:21 PM

## 2022-07-24 NOTE — Plan of Care (Signed)
  Problem: RH Bathing Goal: LTG Patient will bathe all body parts with assist levels (OT) Description: LTG: Patient will bathe all body parts with assist levels (OT) Flowsheets (Taken 07/24/2022 1232) LTG: Pt will perform bathing with assistance level/cueing: (LTG upgraded due to progress.) Contact Guard/Touching assist LTG: Position pt will perform bathing: Shower Note: LTG upgraded due to progress    Problem: RH Dressing Goal: LTG Patient will perform upper body dressing (OT) Description: LTG Patient will perform upper body dressing with assist, with/without cues (OT). Flowsheets (Taken 07/24/2022 1232) LTG: Pt will perform upper body dressing with assistance level of: (LTG upgraded due to progress) Set up assist Note: LTG upgraded due to progress    Problem: RH Toilet Transfers Goal: LTG Patient will perform toilet transfers w/assist (OT) Description: LTG: Patient will perform toilet transfers with assist, with/without cues using equipment (OT) Flowsheets (Taken 07/24/2022 1232) LTG: Pt will perform toilet transfers with assistance level of: (LTG upgraded due to progress) Contact Guard/Touching assist Note: LTG upgraded due to progress    Problem: RH Tub/Shower Transfers Goal: LTG Patient will perform tub/shower transfers w/assist (OT) Description: LTG: Patient will perform tub/shower transfers with assist, with/without cues using equipment (OT) Flowsheets (Taken 07/24/2022 1232) LTG: Pt will perform tub/shower stall transfers with assistance level of: (LTG upgraded due to progress) Minimal Assistance - Patient > 75% LTG: Pt will perform tub/shower transfers from: Tub/shower combination Note: LTG upgraded due to progress

## 2022-07-24 NOTE — Progress Notes (Signed)
Physical Therapy Session Note  Patient Details  Name: Jimmy Valentine MRN: [id number] Date of Birth: May 21, 1950  Today's Date: 07/24/2022 PT Individual Time: 1054-1202 PT Individual Time Calculation (min): 68 min   Short Term Goals: Week 3:  PT Short Term Goal 1 (Week 3): Pt will perform supine<>sit with min assist from L EOB PT Short Term Goal 1 - Progress (Week 3): Met PT Short Term Goal 2 (Week 3): Pt will perform sit<>stands using LRAD with min assist PT Short Term Goal 2 - Progress (Week 3): Met PT Short Term Goal 3 (Week 3): Pt will perform bed<>chair transfers using LRAD with min assist PT Short Term Goal 3 - Progress (Week 3): Met PT Short Term Goal 4 (Week 3): Pt will ambulate at least 78ft using LRAD with min assist PT Short Term Goal 4 - Progress (Week 3): Partly met (64' with min/mod A) PT Short Term Goal 5 (Week 3): Pt will navigate 4 steps using HRs with mod assist of 1 PT Short Term Goal 5 - Progress (Week 3): Progressing toward goal Week 4:  PT Short Term Goal 1 (Week 4): STG = LTG d/t ELOS  Skilled Therapeutic Interventions/Progress Updates: Patient in [facility] with rehab tech on entrance to room. Patient alert and agreeable to PT session.   Patient reported no pain at beginning of PT session.  Therapeutic Activity: Transfers: Pt performed sit<>stand transfers throughout session with RW and light CGA/CGA for safety. Provided VC for anterior weight shift and to ensure B LE's are in stand ready position.  Gait Training:  Pt ambulated 30' x 1 using RW with minA (+WC follow) and VC for patient to avoid hitting PTA foot on R side of RW in order to decrease circumduction, and promote R hip flexion in order to clear R LE through swing phase. Patient required increased time and effort, but was able to avoid circumduction 25% of the time (increased effort for those). Patient performed 2nd trial of about 1' with same cues (to avoid hitting PTA foot on R side), and to primarily focus on  intentional steps to increase neuromuscular connection. Patient then required rest break and reported fatigue from intense stair training prior.   Stair Navigation: - Ascending 4 (6") steps in main gym x 4. 1st trial was with rehab tech pulling yellow theraband around R thigh into hip extension to promote tactile feedback for hip flexion (patient required min/modA to advance R LE). 2nd trial was rehab tech with yellow theraband and 5lb ankle weight on R LE in order to increase neuromuscular connection. Patient required increased assistance (mod/heavy modA) to advance R LE onto step with multimodal cues for patient to bring knees towards PTA hand anteriorly. 3rd (maxA) and 4th (mod/maxA) trial were the same as the 2nd except ascending with the R LE first, and descending with the L (up with bad, down with good) in order to promote increased neuromuscular control/coordination and connection. Patient noted to required less assistance (modA) when ascending final 2 steps on 4th round. - Descending 4 (6") steps in main gym x 4 without theraband and ankle weight on 1st trial (modA to advance R LE and to place on next step in safe position per adduction favoritism towards midline). 2nd trial with 5lb ankle weight on R LE descending with L LE first and required mod/heavy modA to advance R LE and for mod cues to avoid flexion at L knee prior to advancing R LE. 3rd and 4th trial patient was cued  to step down with L LE first in order to increase difficulty for hip flexion carry over in gait (5lb ankle weight on R LE). Patient required mod/maxA to advance R LE until last 2 steps on final trial as patient only required minA to advance, and to place on neutral BOS position. Patient required rest breaks between each trial.   Patient in [facility] at end of session with brakes locked, chair alarm set, and all needs within reach.      Therapy Documentation Precautions:  Precautions Precautions: Fall Precaution Comments: R hemi;  dysarthria Restrictions Weight Bearing Restrictions: Yes   Therapy/Group: Individual Therapy  Miata Culbreth PTA 07/24/2022, 3:13 PM

## 2022-07-24 NOTE — Progress Notes (Signed)
PROGRESS NOTE   Subjective/Complaints:  Up in chair. No new issues today. Denies pain. Sleeping well  ROS: Patient denies fever, rash, sore throat, blurred vision, dizziness, nausea, vomiting, diarrhea, cough, shortness of breath or chest pain, joint or back/neck pain, headache, or mood change.   Objective:   No results found. No results for input(s): "WBC", "HGB", "HCT", "PLT" in the last 72 hours.  Recent Labs    07/24/22 0825  NA 135  K 3.7  CL 102  CO2 23  GLUCOSE 240*  BUN 19  CREATININE 1.02  CALCIUM 8.9     Intake/Output Summary (Last 24 hours) at 07/24/2022 1151 Last data filed at 07/24/2022 1610 Gross per 24 hour  Intake 920 ml  Output 1350 ml  Net -430 ml        Physical Exam: Vital Signs Blood pressure (!) 152/76, pulse 72, temperature 97.6 F (36.4 C), resp. rate 18, height 5\' 9"  (1.753 m), weight 97.8 kg, SpO2 97 %.   Constitutional: No distress . Vital signs reviewed. HEENT: NCAT, EOMI, oral membranes moist Neck: supple Cardiovascular: RRR without murmur. No JVD    Respiratory/Chest: CTA Bilaterally without wheezes or rales. 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 Neurologic: No apparent word finding deficits. 0/5 right UE except trace shoulder protraction/pecs. No finger movement today.  3-/5 hip flexor, 3- knee extensors, 1/5 ankle dorsiflexor and 2- plantar flexor. Extensor tone RLE with PROM still present  Hypotonic right upper extremity Sensory exam normal sensation to light touch  in bilateral upper and lower extremities Cerebellar exam weakness on right precludes testing  Musculoskeletal: Full range of motion in all 4 extremities. Mild erythema and jt swelling RIght index MCP with TTP , no wrist synovitis  or pain with ROM, no swelling in ankles or pain with ankle ROM      Assessment/Plan: 1. Functional deficits which require 3+ hours per day of interdisciplinary therapy in a comprehensive inpatient rehab setting. Physiatrist is providing close team supervision and 24 hour management of active medical problems listed below. Physiatrist and rehab team continue to assess barriers to discharge/monitor patient progress toward functional and medical goals  Care Tool:  Bathing    Body parts bathed by patient: Abdomen, Chest, Right arm, Front perineal area, Right upper leg, Left upper leg, Face, Left arm, Left lower leg, Right lower leg   Body parts bathed by helper: Left arm, Buttocks, Right lower leg, Left lower leg     Bathing assist Assist Level: Minimal Assistance - Patient > 75%     Upper Body Dressing/Undressing Upper body dressing   What is the patient wearing?: Pull over shirt    Upper body assist Assist Level: Supervision/Verbal cueing    Lower Body Dressing/Undressing Lower body dressing      What is the patient wearing?: Incontinence brief, Pants     Lower body assist Assist for lower body dressing: Moderate Assistance - Patient 50 - 74%     Toileting Toileting    Toileting assist  Assist for toileting: Moderate Assistance - Patient 50 - 74%     Transfers Chair/bed transfer  Transfers assist  Chair/bed transfer activity did not occur: Safety/medical concerns  Chair/bed transfer assist level: Minimal Assistance - Patient > 75%     Locomotion Ambulation   Ambulation assist   Ambulation activity did not occur: Safety/medical concerns  Assist level: Minimal Assistance - Patient > 75% Assistive device: Walker-rolling Max distance: 40   Walk 10 feet activity   Assist  Walk 10 feet activity did not occur: Safety/medical concerns  Assist level: Minimal Assistance - Patient > 75% Assistive device: Walker-rolling   Walk 50 feet activity   Assist  Walk 50 feet with 2 turns activity did not occur: Safety/medical concerns         Walk 150 feet activity   Assist Walk 150 feet activity did not occur: Safety/medical concerns         Walk 10 feet on uneven surface  activity   Assist Walk 10 feet on uneven surfaces activity did not occur: Safety/medical concerns   Assist level: Minimal Assistance - Patient > 75% (outdoor surface.) Assistive device: Development worker, international aid     Assist Is the patient using a wheelchair?: Yes Type of Wheelchair: Manual    Wheelchair assist level: Minimal Assistance - Patient > 75% Max wheelchair distance: 50 ft    Wheelchair 50 feet with 2 turns activity    Assist        Assist Level: Dependent - Patient 0%   Wheelchair 150 feet activity     Assist      Assist Level: Dependent - Patient 0%   Blood pressure (!) 152/76, pulse 72, temperature 97.6 F (36.4 C), resp. rate 18, height 5\' 9"  (1.753 m), weight 97.8 kg, SpO2 97 %.  Medical Problem List and Plan: 1. 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 Large basal ganglia infarct. 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  Minimal RUE return ~56mo post CVA              -patient may  shower             -ELOS/Goals: extended 6/27 due to needing to ambulate into/within bathrooms             -Continue WHO and PRAFO             -Continue CIR therapies including PT, OT     -AFO    2.  Antithrombotics: -DVT/anticoagulation:  Pharmaceutical: Lovenox 4omg daily              -antiplatelet therapy: Aspirin and Plavix for three months followed by aspirin alone (started 5/20)   3. Pain Management: Tylenol, Robaxin as needed   4. Mood/Behavior/Sleep: LCSW to evaluate and provide emotional support             -antipsychotic agents: n/a             -trazodone as needed   5.  Neuropsych/cognition: This patient is capable of making decisions on his own behalf.   6. Skin/Wound Care: Routine skin care checks   7. Fluids/Electrolytes/Nutrition: Routine Is and Os and follow-up chemistries             -continue dys3/advanced to thin liquids; SLP following   8: Hypertension: monitor TID and prn (home meds: amlodipine 10 mg QD, valsartan 320 mg daily)             -  continue irbesartan 37.5 mg daily    07/24/2022    6:43 AM 07/23/2022    8:16 PM 07/23/2022   12:58 PM  Vitals with BMI  Systolic 152 133 161  Diastolic 76 80 74  Pulse 72 72 81     -6/2   irbesartan increased to 75mg  6/21 bp fairly controlled  9: Hyperlipidemia: continue high intensity statin, change simvastatin to atorvastatin 80mg  daily   10: Hyponatremia,Na+ fluctuating , Na+ down to 129 from 136 is off FR will resume        Latest Ref Rng & Units 07/24/2022    8:25 AM 07/20/2022    6:10 AM 07/13/2022    6:40 AM  BMP  Glucose 70 - 99 mg/dL 096  045  409   BUN 8 - 23 mg/dL 19  21  16    Creatinine 0.61 - 1.24 mg/dL 8.11  9.14  7.82   Sodium 135 - 145 mmol/L 135  134  133   Potassium 3.5 - 5.1 mmol/L 3.7  3.9  3.9   Chloride 98 - 111 mmol/L 102  99  101   CO2 22 - 32 mmol/L 23  24  22    Calcium 8.9 - 10.3 mg/dL 8.9  8.9  9.1     9/56 f/u sodium improved today.  - Continue 1500cc FR through weekend  -recheck bmet Monday  11: History of gout: resolved off colchicine   12: DM type II: HGB A1C 6.7, CBGs QID and SSI; carb  modified diet when advanced (home meds include metformin 1000 mg BID) CBG (last 3)  Recent Labs    07/23/22 1633 07/23/22 2116 07/24/22 0603  GLUCAP 122* 137* 125*  Controlled 6/21   13: RLE/RUE weakness:normal LE  venous duplex   14: Tobacco use: occasional cigars, advise cessation   15. Constipation             -resolved. Had bm   type 5 6/1  -only on prn senokot-s and sorbitol   - LBM 6/19, having regular bowel movements LOS: 30 days A FACE TO FACE EVALUATION  WAS PERFORMED  Ranelle Oyster 07/24/2022, 11:51 AM

## 2022-07-24 NOTE — Progress Notes (Signed)
Occupational Therapy Session Note  Patient Details  Name: Jimmy Valentine MRN: [id number] Date of Birth: Mar 31, 1950  Today's Date: 07/24/2022 OT Individual Time: 4098-1191 OT Individual Time Calculation (min): 31 min    Short Term Goals: Week 4:  OT Short Term Goal 1 (Week 4): Pt will be able to complete stand pivot transfers with RW w/c to toilet and back with CGA. OT Short Term Goal 2 (Week 4): Pt will be able to complete stand pivot transfers with RW w/c to shower bench and back with CGA. OT Short Term Goal 3 (Week 4): Pt will don a shirt with supervision. OT Short Term Goal 4 (Week 4): pt will be able to don R leg into pants with min A. OT Short Term Goal 5 (Week 4): Pt will be able to actively move R arm on and off his lap to demonstrate increased shoulder control .  Skilled Therapeutic Interventions/Progress Updates:    Patient agreeable to participate in OT session. Reports 0/10 pain level.   Patient participated in skilled OT session focusing on RUE NM re-ed.   Therapist facilitated RUE muscle activation utilizing muscle vibration, muscle tapping, and AA/ROM movement in order to facilitate greater muscle movement and ability to utilize RUE to complete functional ADL tasks. Prior to muscle vibration, OT assessed pt's right shoulder, elbow, wrist, and hand muscle activation with no muscle activation noted.  - Seated, RUE, AA/ROM, shoulder protraction/retraction, shoulder IR/er, 2-3X . Forearm placed on pillowcase on tabletop.  During vibration, various muscle twitches noted RUE. Post muscle vibration, pt was able to demonstrate muscle activation in shoulder while attempting to push pillowcase forward, thumb flexion and index flexion.  At end of session, pt requested to transfer to bed. Pt completed functional transfer from [facility] to bed utilizing RW with right hand splint. VC provided for hand placement with RW management, total set-up of right hand on hand splint provided. Min A required  to complete transfer. Min A required to transition from sit to supine requiring slight assist to bring right leg onto bed. 2 pillows place under RUE for proper positioning with all items needed placed on left side of pt.   Therapy Documentation Precautions:  Precautions Precautions: Fall Precaution Comments: R hemi; dysarthria    Therapy/Group: Individual Therapy  Limmie Patricia, OTR/L,CBIS  Supplemental OT - [facility] and [facility] Secure Chat Preferred   07/24/2022, 8:04 AM

## 2022-07-25 LAB — GLUCOSE, CAPILLARY
Glucose-Capillary: 139 mg/dL — ABNORMAL HIGH (ref 70–99)
Glucose-Capillary: 132 mg/dL — ABNORMAL HIGH (ref 70–99)
Glucose-Capillary: 121 mg/dL — ABNORMAL HIGH (ref 70–99)
Glucose-Capillary: 162 mg/dL — ABNORMAL HIGH (ref 70–99)

## 2022-07-25 MED ORDER — MAGNESIUM GLUCONATE 500 MG PO TABS
250.0000 mg | ORAL_TABLET | Freq: Every day | ORAL | Status: DC
  Administered 2022-07-25: 250 mg via ORAL
  Filled 2022-07-25: qty 1

## 2022-07-25 NOTE — Progress Notes (Signed)
PROGRESS NOTE   Subjective/Complaints: No complaints this morning  ROS: Patient denies fever, rash, sore throat, blurred vision, dizziness, nausea, vomiting, diarrhea, cough, shortness of breath or chest pain, joint or back/neck pain, headache, or mood change.   Objective:   No results found. No results for input(s): "WBC", "HGB", "HCT", "PLT" in the last 72 hours.  Recent Labs    07/24/22 0825  NA 135  K 3.7  CL 102  CO2 23  GLUCOSE 240*  BUN 19  CREATININE 1.02  CALCIUM 8.9     Intake/Output Summary (Last 24 hours) at 07/25/2022 1706 Last data filed at 07/25/2022 1546 Gross per 24 hour  Intake 958 ml  Output 700 ml  Net 258 ml        Physical Exam: Vital Signs Blood pressure (!) 147/62, pulse 77, temperature 97.9 F (36.6 C), temperature source Oral, resp. rate 18, height 5\' 9"  (1.753 m), weight 97.8 kg, SpO2 99 %.  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 Neurologic: No apparent word finding deficits. 0/5 right UE except trace shoulder protraction/pecs. No finger movement today.  3-/5 hip flexor, 3- knee extensors, 1/5 ankle dorsiflexor and 2- plantar flexor. Extensor tone RLE with PROM still present  Hypotonic right upper extremity Sensory exam normal sensation to light touch  in bilateral upper and lower extremities Cerebellar exam weakness on right precludes testing  Musculoskeletal: Full range of motion in all 4 extremities. Mild erythema and jt swelling RIght index MCP with TTP , no wrist synovitis or pain with ROM, no swelling in ankles or pain with ankle ROM      Assessment/Plan: 1. Functional deficits which  require 3+ hours per day of interdisciplinary therapy in a comprehensive inpatient rehab setting. Physiatrist is providing close team supervision and 24 hour management of active medical problems listed below. Physiatrist and rehab team continue to assess barriers to discharge/monitor patient progress toward functional and medical goals  Care Tool:  Bathing    Body parts bathed by patient: Abdomen, Chest, Right arm, Front perineal area, Right upper leg, Left upper leg, Face, Left arm, Left lower leg, Right lower leg, Buttocks   Body parts bathed by helper: Left arm, Buttocks, Right lower leg, Left lower leg     Bathing assist Assist Level: Contact Guard/Touching assist     Upper Body Dressing/Undressing Upper body dressing   What is the patient wearing?: Pull over shirt    Upper body assist Assist Level: Set up assist    Lower Body Dressing/Undressing Lower body dressing      What is the patient wearing?: Incontinence brief, Pants     Lower body assist Assist for lower body dressing: Minimal Assistance - Patient > 75%     Toileting Toileting    Toileting assist Assist for toileting: Moderate Assistance - Patient 50 - 74%     Transfers Chair/bed transfer  Transfers assist  Chair/bed transfer  activity did not occur: Safety/medical concerns  Chair/bed transfer assist level: Contact Guard/Touching assist     Locomotion Ambulation   Ambulation assist   Ambulation activity did not occur: Safety/medical concerns  Assist level: Minimal Assistance - Patient > 75% Assistive device: Walker-rolling Max distance: 40   Walk 10 feet activity   Assist  Walk 10 feet activity did not occur: Safety/medical concerns  Assist level: Minimal Assistance - Patient > 75% Assistive device: Walker-rolling   Walk 50 feet activity   Assist Walk 50 feet with 2 turns activity did not occur: Safety/medical concerns         Walk 150 feet activity   Assist Walk 150 feet  activity did not occur: Safety/medical concerns         Walk 10 feet on uneven surface  activity   Assist Walk 10 feet on uneven surfaces activity did not occur: Safety/medical concerns   Assist level: Minimal Assistance - Patient > 75% (outdoor surface.) Assistive device: Development worker, international aid     Assist Is the patient using a wheelchair?: Yes Type of Wheelchair: Manual    Wheelchair assist level: Minimal Assistance - Patient > 75% Max wheelchair distance: 50 ft    Wheelchair 50 feet with 2 turns activity    Assist        Assist Level: Dependent - Patient 0%   Wheelchair 150 feet activity     Assist      Assist Level: Dependent - Patient 0%   Blood pressure (!) 147/62, pulse 77, temperature 97.9 F (36.6 C), temperature source Oral, resp. rate 18, height 5\' 9"  (1.753 m), weight 97.8 kg, SpO2 99 %.  Medical Problem List and Plan: 1. 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 Large basal ganglia infarct. 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  Minimal RUE return ~56mo post CVA              -patient may  shower             -ELOS/Goals: extended 6/27 due to needing to ambulate into/within bathrooms             -Continue WHO and PRAFO             -Continue CIR therapies including PT, OT     -AFO    2.  Antithrombotics: -DVT/anticoagulation:  Pharmaceutical: Lovenox 4omg daily              -antiplatelet therapy: Aspirin and Plavix for three months followed by aspirin alone (started 5/20)   3. Pain Management: Tylenol, Robaxin as needed   4. Mood/Behavior/Sleep: LCSW to evaluate and provide emotional support             -antipsychotic agents: n/a             -trazodone as needed   5. Neuropsych/cognition: This patient is capable of making decisions on his own behalf.   6. Skin/Wound Care:  Routine skin care checks   7. Fluids/Electrolytes/Nutrition: Routine Is and Os and follow-up chemistries             -continue dys3/advanced to thin liquids; SLP following   8: Hypertension: monitor TID and prn (home meds: amlodipine 10 mg QD, valsartan 320 mg daily)             -continue irbesartan 37.5 mg daily. Add magnesium glucoante 250mg  HS  07/25/2022    2:15 PM 07/25/2022    5:53 AM 07/24/2022    7:51 PM  Vitals with BMI  Systolic 147 135 161  Diastolic 62 80 79  Pulse 77 68 71     -6/2   irbesartan increased to 75mg  6/21 bp fairly controlled  9: Hyperlipidemia: continue high intensity statin, change simvastatin to atorvastatin 80mg  daily   10: Hyponatremia,Na+ fluctuating , Na+ down to 129 from 136 is off FR will resume        Latest Ref Rng & Units 07/24/2022    8:25 AM 07/20/2022    6:10 AM 07/13/2022    6:40 AM  BMP  Glucose 70 - 99 mg/dL 096  045  409   BUN 8 - 23 mg/dL 19  21  16    Creatinine 0.61 - 1.24 mg/dL 8.11  9.14  7.82   Sodium 135 - 145 mmol/L 135  134  133   Potassium 3.5 - 5.1 mmol/L 3.7  3.9  3.9   Chloride 98 - 111 mmol/L 102  99  101   CO2 22 - 32 mmol/L 23  24  22    Calcium 8.9 - 10.3 mg/dL 8.9  8.9  9.1     9/56 f/u sodium improved today.  - Continue 1500cc FR through weekend  -recheck bmet Monday  11: History of gout: resolved off colchicine   12: DM type II: HGB A1C 6.7, CBGs QID and SSI; carb  modified diet when advanced (home meds include metformin 1000 mg BID) CBG (last 3)  Recent Labs    07/25/22 0617 07/25/22 1152 07/25/22 1644  GLUCAP 139* 132* 121*  Add magnesium glucoante 250mg  HS   13: RLE/RUE weakness:normal LE  venous duplex   14: Tobacco use: occasional cigars, advise cessation   15. Constipation             -resolved. Had bm   type 5 6/1  -only on prn senokot-s and sorbitol   - LBM 6/19, having regular bowel movements  Add magnesium glucoante 250mg  HS LOS: 31 days A FACE TO FACE EVALUATION WAS  PERFORMED  Clint Bolder P Zacariah Belue 07/25/2022, 5:06 PM

## 2022-07-25 NOTE — Progress Notes (Signed)
Physical Therapy Weekly Progress Note  Patient Details  Name: Jimmy Valentine MRN: [id number] Date of Birth: February 07, 1950  Beginning of progress report period: Jun 25, 2022 End of progress report period: July 25, 2022  Patient has made significant progress since evaluation by demonstrating being on track towards meeting LTG. Patient has increased R LE musculature activation to improve dynamic functional mobility for bed mobility (minA), transfers via squat pivot (CGA/minA to L), ambulating with RW currently at min/modA (but getting better as sessions progress), and stair navigation (modA but progressing through each PT session). Patient is still functionally limited by R LE/UE weakness and recalling cues to safely perform functional transfers and ambulation on level surfaces and stairs (progressing towards).   Patient continues to demonstrate the following deficits muscle weakness, decreased cardiorespiratoy endurance, impaired timing and sequencing, abnormal tone, unbalanced muscle activation, and decreased coordination, decreased midline orientation, decreased attention to right, and decreased motor planning, decreased awareness, decreased safety awareness, and decreased memory, and decreased standing balance and decreased balance strategies and therefore will continue to benefit from skilled PT intervention to increase functional independence with mobility.  Patient progressing toward long term goals..  Continue plan of care.  PT Short Term Goals Week 5:  PT Short Term Goal 1 (Week 5): STG = LTG d/t ELOS  Therapy Documentation Precautions:  Precautions Precautions: Fall Precaution Comments: R hemi; dysarthria Restrictions Weight Bearing Restrictions: No  General:   Vital Signs:  Pain: Pain Assessment Pain Score: 0   Mikaylee Arseneau PTA 07/25/2022, 4:22 PM   Loel Dubonnet PT, DPT, CSRS 07/27/2022, 7:01 AM

## 2022-07-26 LAB — GLUCOSE, CAPILLARY
Glucose-Capillary: 184 mg/dL — ABNORMAL HIGH (ref 70–99)
Glucose-Capillary: 112 mg/dL — ABNORMAL HIGH (ref 70–99)
Glucose-Capillary: 152 mg/dL — ABNORMAL HIGH (ref 70–99)
Glucose-Capillary: 121 mg/dL — ABNORMAL HIGH (ref 70–99)

## 2022-07-26 MED ORDER — METFORMIN HCL 500 MG PO TABS
500.0000 mg | ORAL_TABLET | Freq: Two times a day (BID) | ORAL | Status: DC
  Administered 2022-07-26 – 2022-07-30 (×8): 500 mg via ORAL
  Filled 2022-07-26 (×8): qty 1

## 2022-07-26 MED ORDER — MAGNESIUM GLUCONATE 500 MG PO TABS
500.0000 mg | ORAL_TABLET | Freq: Every day | ORAL | Status: DC
  Administered 2022-07-26 – 2022-07-29 (×4): 500 mg via ORAL
  Filled 2022-07-26 (×4): qty 1

## 2022-07-26 NOTE — Progress Notes (Signed)
PROGRESS NOTE   Subjective/Complaints: No complains this morning Feels he would benefit from more time here No issues overnight  ROS: Patient denies fever, rash, sore throat, blurred vision, dizziness, nausea, vomiting, diarrhea, cough, shortness of breath or chest pain, joint or back/neck pain, headache, or mood change.   Objective:   No results found. No results for input(s): "WBC", "HGB", "HCT", "PLT" in the last 72 hours.  Recent Labs    07/24/22 0825  NA 135  K 3.7  CL 102  CO2 23  GLUCOSE 240*  BUN 19  CREATININE 1.02  CALCIUM 8.9     Intake/Output Summary (Last 24 hours) at 07/26/2022 1640 Last data filed at 07/26/2022 1300 Gross per 24 hour  Intake 980 ml  Output 850 ml  Net 130 ml        Physical Exam: Vital Signs Blood pressure (!) 143/78, pulse 80, temperature 97.7 F (36.5 C), temperature source Oral, resp. rate 17, height 5\' 9"  (1.753 m), weight 97.8 kg, SpO2 99 %.  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 Neurologic: No apparent word finding deficits. 0/5 right UE except trace shoulder protraction/pecs. No finger movement today.  3-/5 hip flexor, 3- knee extensors, 1/5 ankle dorsiflexor and 2- plantar flexor. Extensor tone RLE with PROM still present  Hypotonic right upper extremity Sensory exam normal sensation to light touch  in bilateral upper and lower extremities Cerebellar exam weakness on right precludes testing  Musculoskeletal: Full range of motion in all 4 extremities. Mild erythema and jt swelling RIght index MCP with TTP , no wrist  synovitis or pain with ROM, no swelling in ankles or pain with ankle ROM      Assessment/Plan: 1. Functional deficits which require 3+ hours per day of interdisciplinary therapy in a comprehensive inpatient rehab setting. Physiatrist is providing close team supervision and 24 hour management of active medical problems listed below. Physiatrist and rehab team continue to assess barriers to discharge/monitor patient progress toward functional and medical goals  Care Tool:  Bathing    Body parts bathed by patient: Abdomen, Chest, Right arm, Front perineal area, Right upper leg, Left upper leg, Face, Left arm, Left lower leg, Right lower leg, Buttocks   Body parts bathed by helper: Left arm, Buttocks, Right lower leg, Left lower leg     Bathing assist Assist Level: Contact Guard/Touching assist     Upper Body Dressing/Undressing Upper body dressing   What is the patient wearing?: Pull over shirt    Upper body assist Assist Level: Set up assist    Lower Body Dressing/Undressing Lower body dressing      What is the patient wearing?: Incontinence brief, Pants     Lower body assist Assist for lower body dressing: Minimal Assistance - Patient > 75%     Toileting Toileting    Toileting assist Assist for  toileting: Moderate Assistance - Patient 50 - 74%     Transfers Chair/bed transfer  Transfers assist  Chair/bed transfer activity did not occur: Safety/medical concerns  Chair/bed transfer assist level: Contact Guard/Touching assist     Locomotion Ambulation   Ambulation assist   Ambulation activity did not occur: Safety/medical concerns  Assist level: Minimal Assistance - Patient > 75% Assistive device: Walker-rolling Max distance: 40   Walk 10 feet activity   Assist  Walk 10 feet activity did not occur: Safety/medical concerns  Assist level: Minimal Assistance - Patient > 75% Assistive device: Walker-rolling   Walk 50 feet activity   Assist Walk 50  feet with 2 turns activity did not occur: Safety/medical concerns         Walk 150 feet activity   Assist Walk 150 feet activity did not occur: Safety/medical concerns         Walk 10 feet on uneven surface  activity   Assist Walk 10 feet on uneven surfaces activity did not occur: Safety/medical concerns   Assist level: Minimal Assistance - Patient > 75% (outdoor surface.) Assistive device: Development worker, international aid     Assist Is the patient using a wheelchair?: Yes Type of Wheelchair: Manual    Wheelchair assist level: Minimal Assistance - Patient > 75% Max wheelchair distance: 50 ft    Wheelchair 50 feet with 2 turns activity    Assist        Assist Level: Dependent - Patient 0%   Wheelchair 150 feet activity     Assist      Assist Level: Dependent - Patient 0%   Blood pressure (!) 143/78, pulse 80, temperature 97.7 F (36.5 C), temperature source Oral, resp. rate 17, height 5\' 9"  (1.753 m), weight 97.8 kg, SpO2 99 %.  Medical Problem List and Plan: 1. 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 Large basal ganglia infarct. 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  Minimal RUE return ~50mo post CVA              -patient may  shower             -ELOS/Goals: extended 6/27 due to needing to ambulate into/within bathrooms             -Continue WHO and PRAFO             -Continue CIR therapies including PT, OT     -AFO    2.  Antithrombotics: -DVT/anticoagulation:  Pharmaceutical: Lovenox 4omg daily              -antiplatelet therapy: Aspirin and Plavix for three months followed by aspirin alone (started 5/20)   3. Pain Management: Tylenol, Robaxin as needed   4. Mood/Behavior/Sleep: LCSW to evaluate and provide emotional support             -antipsychotic agents: n/a             -trazodone as  needed   5. Neuropsych/cognition: This patient is capable of making decisions on his own behalf.   6. Skin/Wound Care: Routine skin care checks   7. Fluids/Electrolytes/Nutrition: Routine Is and Os and follow-up chemistries             -continue dys3/advanced to thin liquids; SLP following   8: Hypertension: monitor TID and prn (home meds: amlodipine 10 mg QD, valsartan 320 mg daily)             -  continue irbesartan 37.5 mg daily. Increase magnesium glucoante to 500mg  HS    07/26/2022    1:19 PM 07/26/2022   10:02 AM 07/26/2022    4:38 AM  Vitals with BMI  Systolic 143 157 161  Diastolic 78 80 67  Pulse 80 69 79     -6/2   irbesartan increased to 75mg   9: Hyperlipidemia: continue high intensity statin, change simvastatin to atorvastatin 80mg  daily   10: Hyponatremia,Na+ fluctuating , Na+ down to 129 from 136 is off FR will resume        Latest Ref Rng & Units 07/24/2022    8:25 AM 07/20/2022    6:10 AM 07/13/2022    6:40 AM  BMP  Glucose 70 - 99 mg/dL 096  045  409   BUN 8 - 23 mg/dL 19  21  16    Creatinine 0.61 - 1.24 mg/dL 8.11  9.14  7.82   Sodium 135 - 145 mmol/L 135  134  133   Potassium 3.5 - 5.1 mmol/L 3.7  3.9  3.9   Chloride 98 - 111 mmol/L 102  99  101   CO2 22 - 32 mmol/L 23  24  22    Calcium 8.9 - 10.3 mg/dL 8.9  8.9  9.1     9/56 f/u sodium improved today.  - Continue 1500cc FR through weekend  -recheck bmet Monday  11: History of gout: resolved off colchicine   12: DM type II: HGB A1C 6.7, CBGs QID and SSI; carb  modified diet when advanced (home meds include metformin 1000 mg BID) CBG (last 3)  Recent Labs    07/25/22 2055 07/26/22 0602 07/26/22 1143  GLUCAP 162* 184* 112*   Continue magnesium glucoante 500 HS Increase metformin to 500mg  BID   13: RLE/RUE weakness:normal LE  venous duplex   14: Tobacco use: occasional cigars, advise cessation   15. Constipation             -resolved. Had bm   type 5 6/1  -only on prn senokot-s and sorbitol   -  LBM 6/19, having regular bowel movements  Increase magnesium gluconate to 500mg  HS LOS: 32 days A FACE TO FACE EVALUATION WAS PERFORMED  Clint Bolder P Kyleah [place] 07/26/2022, 4:40 PM

## 2022-07-26 NOTE — Progress Notes (Cosign Needed)
PERRLA test conducted and passed Patient is alert and oriented X4 Lung and heart sounds are normal Bowel sounds present in all four quadrants Range of motion for left upper and left lower extremities are good Range of motion in right upper and lower extremities is limited Left and right radial pulses +2 Left and right brachial pulses +2 Right Dorsal Pedal pulse and right posterior tibial pulse +1 Unable to assess left dorsal pedal pulse and left tibial pulse.  Informed the nurse of my findings.

## 2022-07-26 NOTE — Progress Notes (Signed)
PERRLA test conducted and passed Patient is alert and oriented X4 Lung and heart sounds are normal Bowel sounds present in all four quadrants Range of motion for left upper and left lower extremities are good Range of motion in right upper and lower extremities is limited Left and right radial pulses +2 Left and right brachial pulses +2 Right Dorsal Pedal pulse and right posterior tibial pulse +1 Unable to assess left dorsal pedal pulse and left tibial pulse.  Informed the nurse of my findings.    I attest with this student's documentation.

## 2022-07-27 ENCOUNTER — Inpatient Hospital Stay (HOSPITAL_COMMUNITY): Payer: Medicare HMO

## 2022-07-27 DIAGNOSIS — M25531 Pain in right wrist: Secondary | ICD-10-CM

## 2022-07-27 LAB — CBC
HCT: 40.2 % (ref 39.0–52.0)
Hemoglobin: 14 g/dL (ref 13.0–17.0)
MCH: 31.3 pg (ref 26.0–34.0)
MCHC: 34.8 g/dL (ref 30.0–36.0)
MCV: 89.9 fL (ref 80.0–100.0)
Platelets: 228 10*3/uL (ref 150–400)
RBC: 4.47 MIL/uL (ref 4.22–5.81)
RDW: 12.7 % (ref 11.5–15.5)
WBC: 8.1 10*3/uL (ref 4.0–10.5)
nRBC: 0 % (ref 0.0–0.2)

## 2022-07-27 LAB — GLUCOSE, CAPILLARY
Glucose-Capillary: 114 mg/dL — ABNORMAL HIGH (ref 70–99)
Glucose-Capillary: 156 mg/dL — ABNORMAL HIGH (ref 70–99)
Glucose-Capillary: 110 mg/dL — ABNORMAL HIGH (ref 70–99)
Glucose-Capillary: 141 mg/dL — ABNORMAL HIGH (ref 70–99)

## 2022-07-27 LAB — BASIC METABOLIC PANEL
Anion gap: 7 (ref 5–15)
BUN: 18 mg/dL (ref 8–23)
CO2: 25 mmol/L (ref 22–32)
Calcium: 8.9 mg/dL (ref 8.9–10.3)
Chloride: 104 mmol/L (ref 98–111)
Creatinine, Ser: 0.96 mg/dL (ref 0.61–1.24)
GFR, Estimated: >60 mL/min (ref >60)
Glucose, Bld: 131 mg/dL — ABNORMAL HIGH (ref 70–99)
Potassium: 4.2 mmol/L (ref 3.5–5.1)
Sodium: 136 mmol/L (ref 135–145)

## 2022-07-27 MED ORDER — DICLOFENAC SODIUM 1 % EX GEL
2.0000 g | Freq: Three times a day (TID) | CUTANEOUS | Status: DC
  Administered 2022-07-27 – 2022-07-30 (×8): 2 g via TOPICAL
  Filled 2022-07-27: qty 100

## 2022-07-27 NOTE — Progress Notes (Signed)
Patient ID: Jimmy Valentine, male   DOB: [birth year], 72 y.o.   MRN: [id number]  Sw spoke with patient to confirm preference of follow up. Patient prefers HH with a transition to OP once [facility] services are complete. Daughter requesting a call tomorrow to discuss loaner details. No additional questions or concerns.

## 2022-07-27 NOTE — Progress Notes (Signed)
Reports having BM's daily, refused scheduled Senna S. Usually continent using urinal. Slept good. Complains of discomfort to right wrist, especially with movement. PRAFO & WHO applied to right side. New cough per patient, PRN robitussin given at 1950,  really helped-per patient. NP cough. I.S. given to patient, able to get it up to 850, 6 times. PRN trazodone given at 2132. Feeling a little blah over weekend, thinking about current situation. Alfredo Martinez A

## 2022-07-27 NOTE — Progress Notes (Signed)
Complaining of right wrist pain. Ordered Voltaren gel and x-rays. (Treated earlier in admission with colchicine due to pain, edema, erythema.)

## 2022-07-27 NOTE — Progress Notes (Signed)
Occupational Therapy Session Note  Patient Details  Name: Jimmy Valentine MRN: [id number] Date of Birth: [birth year]  Today's Date: 07/27/2022 OT Individual Time: 800-900 Session 1, 1500-1545 Session 2 OT Individual Time Calculation (min): 60 min, 45 min    Short Term Goals: Week 5:  OT Short Term Goal 1 (Week 5): STGs = LTGs  Skilled Therapeutic Interventions/Progress Updates:  Session 1:  Pt seen for full am ADL session with focus on toileting, shower, dressing, grooming and R sided NMRE. Supine to sit with adapted methods for mirroring home bed and roll technique with increased time and min A. Amb this session OOB to commode, in and out of stall shower to TTB back to w/c with set up cues but CGA consistency with R sided adapted walker splint. When performing self supported standing, pt requires min A for bowel hygiene, buttocks bathing and LB garment mngt pulling up and down over hips. OT replaced ktape to R shoulder region for prevention of further pain and subluxation. This session S for UB dressing and bathing and min A with cues using reacher excluding socks, shoes and AFO for LB self care. Unable to perform AFO trial donning without max A due to inability to un-weight R LE. Standing for oral care sink side with CGA. Left pt up in w/c with 1/2 lap tray, needs and nurse call button in place.    Pain: Mild stiffness with gentle ROM R hand during bathing and dressing. Relief with positioning on 1/2 laptray and ktaping.   Session 2:  Pt bed level upon OT arrival with R UE well-positioned on pillow. OT addressed distal hand stiffness on R with moist heat, gentle lotion massage and joint mobilization as pt barely could be moved to 1/2 range grasp this am and then approached ful grasp range after R hand attention. Care coord with MD who briefly assessed pt with pt demo improved scapular retraction, new clonus, some trace elbow extensors, intermittent spontaneous prox and distal active flexor  movement but remains fleeting. Saebo StimOne application for elbow flexors, wrist flexors and extensors. See below for Saebo parameters and response. Placed R hand in mirror box and performed L hand biceps/triceps facil as well as grasp with some spontaneous AAROM and trace distal noted. Left pt bed level with bed exit engaged, needs and nurse call button in place.    Saebo StimOne parameters:    no adverse skin reactions.  330 pulse width 35 Hz pulse rate On 8 sec/ off 8 sec Ramp up/ down 2 sec Symmetrical Biphasic wave form  Max intensity at 500 Ohm load  Pain: see above treatment   Therapy Documentation Precautions:  Precautions Precautions: Fall Precaution Comments: R hemi; dysarthria Restrictions Weight Bearing Restrictions: No   Therapy/Group: Individual Therapy  Vicenta Dunning 07/27/2022, 7:45 AM

## 2022-07-27 NOTE — Progress Notes (Signed)
PROGRESS NOTE   Subjective/Complaints: Working with OT on hand function, noted to have increased motor function in her RUE. Pt has noted some R wrist discomfort. Reports BM yesterday.   ROS: Patient denies fever, rash, sore throat, blurred vision, dizziness, nausea, vomiting, diarrhea, cough, shortness of breath or chest pain,  back/neck pain, headache, or mood change.  + wrist pain right  Objective:   No results found. Recent Labs    07/27/22 0535  WBC 8.1  HGB 14.0  HCT 40.2  PLT 228    Recent Labs    07/27/22 0535  NA 136  K 4.2  CL 104  CO2 25  GLUCOSE 131*  BUN 18  CREATININE 0.96  CALCIUM 8.9      Intake/Output Summary (Last 24 hours) at 07/27/2022 0852 Last data filed at 07/27/2022 0557 Gross per 24 hour  Intake 627 ml  Output 1650 ml  Net -1023 ml         Physical Exam: Vital Signs Blood pressure (!) 143/74, pulse 77, temperature 98.1 F (36.7 C), temperature source Oral, resp. rate 18, height 5\' 9"  (1.753 m), weight 97.8 kg, SpO2 98 %.  Gen: no distress, normal appearing HEENT: oral mucosa pink and moist, NCAT Cardio: RRR Chest: CTAB, normal effort, normal rate of breathing Abd: soft, non-distended, +BS Ext: no edema Psych: pleasant, normal affect Skin: intact  Neurologic: No apparent word finding deficits. Strength 1-2/5 R elbow extension 1/5 elbow flexion noted, clonus at wrist noted Hypotonic right upper extremity Sensory exam normal sensation to light touch  in bilateral upper and lower extremities  Musculoskeletal: Full range of motion in all 4 extremities. No erythema or swelling R wrist/hand noted, working with OT on RUE movements   Assessment/Plan: 1. Functional deficits which require 3+ hours per day of interdisciplinary therapy in a comprehensive inpatient rehab setting. Physiatrist is providing close team supervision and 24 hour management of active medical problems listed  below. Physiatrist and rehab team continue to assess barriers to discharge/monitor patient progress toward functional and medical goals  Care Tool:  Bathing    Body parts bathed by patient: Abdomen, Chest, Right arm, Front perineal area, Right upper leg, Left upper leg, Face, Left arm, Left lower leg, Right lower leg, Buttocks   Body parts bathed by helper: Left arm, Buttocks, Right lower leg, Left lower leg     Bathing assist Assist Level: Contact Guard/Touching assist     Upper Body Dressing/Undressing Upper body dressing   What is the patient wearing?: Pull over shirt    Upper body assist Assist Level: Set up assist    Lower Body Dressing/Undressing Lower body dressing      What is the patient wearing?: Incontinence brief, Pants     Lower body assist Assist for lower body dressing: Minimal Assistance - Patient > 75%     Toileting Toileting    Toileting assist Assist for toileting: Moderate Assistance - Patient 50 - 74%     Transfers Chair/bed transfer  Transfers assist  Chair/bed transfer activity did not occur: Safety/medical concerns  Chair/bed transfer assist level: Contact Guard/Touching assist     Locomotion Ambulation   Ambulation assist  Ambulation activity did not occur: Safety/medical concerns  Assist level: Minimal Assistance - Patient > 75% Assistive device: Walker-rolling Max distance: 40   Walk 10 feet activity   Assist  Walk 10 feet activity did not occur: Safety/medical concerns  Assist level: Minimal Assistance - Patient > 75% Assistive device: Walker-rolling   Walk 50 feet activity   Assist Walk 50 feet with 2 turns activity did not occur: Safety/medical concerns         Walk 150 feet activity   Assist Walk 150 feet activity did not occur: Safety/medical concerns         Walk 10 feet on uneven surface  activity   Assist Walk 10 feet on uneven surfaces activity did not occur: Safety/medical  concerns   Assist level: Minimal Assistance - Patient > 75% (outdoor surface.) Assistive device: Development worker, international aid     Assist Is the patient using a wheelchair?: Yes Type of Wheelchair: Manual    Wheelchair assist level: Minimal Assistance - Patient > 75% Max wheelchair distance: 50 ft    Wheelchair 50 feet with 2 turns activity    Assist        Assist Level: Dependent - Patient 0%   Wheelchair 150 feet activity     Assist      Assist Level: Dependent - Patient 0%   Blood pressure (!) 143/74, pulse 77, temperature 98.1 F (36.7 C), temperature source Oral, resp. rate 18, height 5\' 9"  (1.753 m), weight 97.8 kg, SpO2 98 %.  Medical Problem List and Plan: 1. 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 Large basal ganglia infarct. 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  Minimal RUE return ~38mo post CVA              -patient may  shower             -ELOS/Goals: extended 6/27 due to needing to ambulate into/within bathrooms             -Continue WHO and PRAFO             -Continue CIR therapies including PT, OT     -AFO    2.  Antithrombotics: -DVT/anticoagulation:  Pharmaceutical: Lovenox 4omg daily              -antiplatelet therapy: Aspirin and Plavix for three months followed by aspirin alone (started 5/20)   3. Pain Management: Tylenol, Robaxin as needed   4. Mood/Behavior/Sleep: LCSW to evaluate and provide emotional support             -antipsychotic agents: n/a             -trazodone as needed   5. Neuropsych/cognition: This patient is capable of making decisions on his own behalf.   6. Skin/Wound Care: Routine skin care checks   7. Fluids/Electrolytes/Nutrition: Routine Is and Os and follow-up chemistries             -continue dys3/advanced to thin liquids; SLP following   8:  Hypertension: monitor TID and prn (home meds: amlodipine 10 mg QD, valsartan 320 mg daily)             -continue irbesartan 37.5 mg daily. Increase magnesium glucoante to 500mg  HS    07/27/2022    4:14 AM 07/26/2022    7:49 PM 07/26/2022    1:19 PM  Vitals with BMI  Systolic  143 146 143  Diastolic 74 63 78  Pulse 77 70 80     -6/2   irbesartan increased to 75mg   -6/24 fair control, continue to monitor for now  9: Hyperlipidemia: continue high intensity statin, change simvastatin to atorvastatin 80mg  daily   10: Hyponatremia,Na+ fluctuating , Na+ down to 129 from 136 is off FR will resume        Latest Ref Rng & Units 07/27/2022    5:35 AM 07/24/2022    8:25 AM 07/20/2022    6:10 AM  BMP  Glucose 70 - 99 mg/dL 244  010  272   BUN 8 - 23 mg/dL 18  19  21    Creatinine 0.61 - 1.24 mg/dL 5.36  6.44  0.34   Sodium 135 - 145 mmol/L 136  135  134   Potassium 3.5 - 5.1 mmol/L 4.2  3.7  3.9   Chloride 98 - 111 mmol/L 104  102  99   CO2 22 - 32 mmol/L 25  23  24    Calcium 8.9 - 10.3 mg/dL 8.9  8.9  8.9     7/42 f/u sodium improved today.  - Continue 1500cc FR through weekend  -recheck bmet Monday  6/24 Na stable 136 11: History of gout: resolved off colchicine   12: DM type II: HGB A1C 6.7, CBGs QID and SSI; carb  modified diet when advanced (home meds include metformin 1000 mg BID) CBG (last 3)  Recent Labs    07/26/22 1643 07/26/22 2102 07/27/22 0557  GLUCAP 152* 121* 114*    Continue magnesium glucoante 500 HS Increase metformin to 500mg  BID 6/24 CBGs controlled   13: RLE/RUE weakness:normal LE  venous duplex   14: Tobacco use: occasional cigars, advise cessation   15. Constipation             -resolved. Had bm   type 5 6/1  -only on prn senokot-s and sorbitol   - LBM 6/19, having regular bowel movements  Increase magnesium gluconate to 500mg  HS  -6/24 Pt reports BM yesterday, LBM recorded 2 days ago, continue to monitor 16. R wrist pain  -6/24 voltaren gel/xrays  started today    LOS: 33 days A FACE TO FACE EVALUATION WAS PERFORMED  Fanny Dance 07/27/2022, 8:52 AM

## 2022-07-27 NOTE — Progress Notes (Signed)
Physical Therapy Session Note  Patient Details  Name: Jimmy Valentine MRN: [id number] Date of Birth: 1950-11-30  Today's Date: 07/27/2022 PT Individual Time: 1000-1100 PT Individual Time Calculation (min): 60 min   Short Term Goals: Week 5:  PT Short Term Goal 1 (Week 5): STG = LTG d/t ELOS  Skilled Therapeutic Interventions/Progress Updates:  Patient greeted sitting upright in wheelchair in his room and agreeable to PT treatment session. Patient wheeled to/from his room and day room gym for time management and energy conservation.   Patient performed various sit/stands with RW and CGA for safety- Once standing, therapist assisted with placing Rt hand on hand splint.   Patient gait trained x43' and x45' with RW and CGA/MinA for improved stability and RW management- VC for improved Lt lateral weight shift in order to improve RLE swing phase of gait. During second gait trial, patient required Min/ModA at times secondary to fatigue. Patient at times demonstrating poor RW management secondary to limited weight-bearing through RUE.   Patient stood at hemi-rail with LUE support while kicking a ball back and forth with rehab tech, 2 x 10 on each LE- Performed with RLE in order to improve swing phase of gait and then performed with LLE in order to improve stance phase and overall stability. VC for improved postural extension with good effort and improvements noted.   Patient stood with LUE at hemi-rail while performing Rt foot taps to 4" step in order to improve Rt hip flexor activation for improve swing phase of gait and to decrease compensatory strategies. Patient performed 2 x 10 with limited clearance at times, however no compensatory strategies noted which was the goal.   Patient returned to his room and left sitting upright in wheelchair with tray table in front, call bell within reach and all needs met.    Therapy Documentation Precautions:  Precautions Precautions: Fall Precaution  Comments: R hemi; dysarthria Restrictions Weight Bearing Restrictions: No  Pain: No/Denies pain.    Therapy/Group: Individual Therapy  Tayden Duran 07/27/2022, 7:58 AM

## 2022-07-27 NOTE — Progress Notes (Signed)
Patient ID: Jimmy Valentine, male   DOB: [birth year], 72 y.o.   MRN: [id number]  SW received call from daughter, Kennyth Arnold. Daughter has called to make co-payments on DME with [employer].  [employer] providing all DME bedsides bed rails and wheelchair. Daughter to purchase bed rail separately. Sw informed daughter of d/c tine frame on Thursday. No additional questions or concerns. SW will follow up wit patient to determine HH VS. OP.

## 2022-07-27 NOTE — Progress Notes (Signed)
Physical Therapy Session Note  Patient Details  Name: Jimmy Valentine MRN: [id number] Date of Birth: 06-Jun-1950  Today's Date: 07/27/2022 PT Individual Time: 1302-1350 PT Individual Time Calculation (min): 48 min   Short Term Goals: Week 5:  PT Short Term Goal 1 (Week 5): STG = LTG d/t ELOS  Skilled Therapeutic Interventions/Progress Updates:  Patient seated upright in w/c  on entrance to room. Patient alert and agreeable to PT session. AFO and shoes already donned.   Patient with no pain complaint at start of session.  Therapeutic Activity:  Transfers: Pt performed sit<>stand transfers this session with continued need for vc as to R foot positioning and minimal cues for R hand placement on RW. Stand pivot transfers throughout session with MinA for balance and vc for sequence in pivot stepping. Will need to continue to work on squat pivot prior to return home in order to safely transfer from seat to seat in the case of missing supervision/ assist.   Gait Training:  Pt introduced to [facility] this day and is able to ambulate with CGA/ MinA for balance with consistent vc/ tc for sequencing sequence of HW advancement followed by step with RLE then LLE. Pt wants to step LLE first. Pt says he feels steadier with the RW and likes his RUE supported in the saddle splint despite consistently kicking the RW with the RLE during swing phase d/t continued circumduction. With [facility], is able to walk out, turn 180*, return to w/c covering 67 ft.   Pt then ambulated 55 ft using RW with R hand saddle splint and MinA for RLE advancement. Demonstrated continued circumduction in order to advance RLE and kicks RW leg. Therpist providing MinA also for RW mgmt.   Bed mobility:  Pt returns to supine from seated position on EOB using bedrail and requires CGA and vc for effort to bring RLE onto bed surface.   Patient supine in bed at end of session with brakes locked, bed alarm set, and all needs within reach.   Therapy  Documentation Precautions:  Precautions Precautions: Fall Precaution Comments: R hemi; dysarthria Restrictions Weight Bearing Restrictions: No General:   Vital Signs: Therapy Vitals Temp: (!) 97.5 F (36.4 C) Pulse Rate: 73 Resp: 17 BP: 120/68 Patient Position (if appropriate): Lying Oxygen Therapy SpO2: 98 % O2 Device: Room Air Pain:  No pain related this session.   Therapy/Group: Individual Therapy  Loel Dubonnet PT, DPT, CSRS 07/27/2022, 6:24 PM

## 2022-07-28 LAB — GLUCOSE, CAPILLARY
Glucose-Capillary: 133 mg/dL — ABNORMAL HIGH (ref 70–99)
Glucose-Capillary: 145 mg/dL — ABNORMAL HIGH (ref 70–99)
Glucose-Capillary: 157 mg/dL — ABNORMAL HIGH (ref 70–99)
Glucose-Capillary: 121 mg/dL — ABNORMAL HIGH (ref 70–99)

## 2022-07-28 NOTE — Progress Notes (Signed)
PROGRESS NOTE   Subjective/Complaints: Working with OT this AM. Reports wrist pain is improved this AM, it hurts mostly after a lot of movement.   ROS: Patient denies fever, rash, sore throat, blurred vision, dizziness, nausea, vomiting, diarrhea, cough, shortness of breath or chest pain,  back/neck pain, headache, or mood change.  + wrist pain right- improved  Objective:   DG Wrist Complete Right  Result Date: 07/27/2022 CLINICAL DATA:  Pain EXAM: RIGHT WRIST - COMPLETE 3+ VIEW COMPARISON:  None Available. FINDINGS: No displaced fracture or dislocation is seen. No significant focal erosive changes are noted. Small smoothly marginated calcifications are noted adjacent to scaphoid and distal end of radius. Faint chondrocalcinosis is seen in the wrist. Degenerative changes are noted with bony spurs in first carpometacarpal joint. IMPRESSION: No recent fracture or dislocation is seen. There are small faint calcifications adjacent to scaphoid, distal radius and distal ulna suggesting possible cartilage calcifications due to osteoarthritis or gouty arthritis. No significant erosive changes are noted. 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 Electronically Signed   By: Ernie Avena M.D.   On: 07/27/2022 16:25   Recent Labs    07/27/22 0535  WBC 8.1  HGB 14.0  HCT 40.2  PLT 228     Recent Labs    07/27/22 0535  NA 136  K 4.2  CL 104  CO2 25  GLUCOSE 131*  BUN 18  CREATININE 0.96  CALCIUM 8.9      Intake/Output Summary (Last 24 hours) at 07/28/2022 0829 Last data filed at 07/28/2022 2956 Gross per 24 hour  Intake 540 ml  Output 900 ml  Net -360 ml         Physical Exam: Vital Signs Blood pressure 126/77, pulse 69, temperature 97.7 F (36.5 C), resp. rate 18, height 5\' 9"  (1.753 m), weight 97.8 kg, SpO2 96 %.  Gen: no distress, normal appearing, lying in bed, OT in  room HEENT: oral mucosa pink and moist, NCAT Cardio: RRR Chest: CTAB, normal effort, normal rate of breathing Abd: soft, non-distended, +BS Ext: no edema Psych: pleasant, normal affect Skin: intact  Neurologic: No apparent word finding deficits. Strength 1/5 R elbow extension 1/5 elbow flexion noted, clonus at wrist noted, passive ROM R hand increased from yesterday Sensory exam normal sensation to light touch  in bilateral upper and lower extremities  Musculoskeletal: Full range of motion in all 4 extremities. No erythema or swelling R wrist/hand noted, mild pain with Passive ROM R wrist   Assessment/Plan: 1. Functional deficits which require 3+ hours per day of interdisciplinary therapy in a comprehensive inpatient rehab setting. Physiatrist is providing close team supervision and 24 hour management of active medical problems listed below. Physiatrist and rehab team continue to assess barriers to discharge/monitor patient progress toward functional and medical goals  Care Tool:  Bathing    Body parts bathed by patient: Abdomen, Chest, Right arm, Front perineal area, Right upper leg, Left upper leg, Face, Left arm, Left lower leg, Right lower leg, Buttocks   Body parts bathed by helper: Left arm, Buttocks, Right lower leg, Left lower leg     Bathing assist Assist  Level: Contact Guard/Touching assist     Upper Body Dressing/Undressing Upper body dressing   What is the patient wearing?: Pull over shirt    Upper body assist Assist Level: Set up assist    Lower Body Dressing/Undressing Lower body dressing      What is the patient wearing?: Incontinence brief, Pants     Lower body assist Assist for lower body dressing: Minimal Assistance - Patient > 75%     Toileting Toileting    Toileting assist Assist for toileting: Moderate Assistance - Patient 50 - 74%     Transfers Chair/bed transfer  Transfers assist  Chair/bed transfer activity did not occur:  Safety/medical concerns  Chair/bed transfer assist level: Contact Guard/Touching assist     Locomotion Ambulation   Ambulation assist   Ambulation activity did not occur: Safety/medical concerns  Assist level: Minimal Assistance - Patient > 75% Assistive device: Walker-rolling Max distance: 40   Walk 10 feet activity   Assist  Walk 10 feet activity did not occur: Safety/medical concerns  Assist level: Minimal Assistance - Patient > 75% Assistive device: Walker-rolling   Walk 50 feet activity   Assist Walk 50 feet with 2 turns activity did not occur: Safety/medical concerns         Walk 150 feet activity   Assist Walk 150 feet activity did not occur: Safety/medical concerns         Walk 10 feet on uneven surface  activity   Assist Walk 10 feet on uneven surfaces activity did not occur: Safety/medical concerns   Assist level: Minimal Assistance - Patient > 75% (outdoor surface.) Assistive device: Development worker, international aid     Assist Is the patient using a wheelchair?: Yes Type of Wheelchair: Manual    Wheelchair assist level: Minimal Assistance - Patient > 75% Max wheelchair distance: 50 ft    Wheelchair 50 feet with 2 turns activity    Assist        Assist Level: Dependent - Patient 0%   Wheelchair 150 feet activity     Assist      Assist Level: Dependent - Patient 0%   Blood pressure 126/77, pulse 69, temperature 97.7 F (36.5 C), resp. rate 18, height 5\' 9"  (1.753 m), weight 97.8 kg, SpO2 96 %.  Medical Problem List and Plan: 1. 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 Large basal ganglia infarct. 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  Minimal RUE return ~7mo post CVA              -patient may  shower             -ELOS/Goals: extended 6/27 due to needing to ambulate  into/within bathrooms             -Continue WHO and PRAFO             -Continue CIR therapies including PT, OT     -AFO    -Team conference tomorrow  2.  Antithrombotics: -DVT/anticoagulation:  Pharmaceutical: Lovenox 4omg daily              -antiplatelet therapy: Aspirin and Plavix for three months followed by aspirin alone (started 5/20)   3. Pain Management: Tylenol, Robaxin as needed   4. Mood/Behavior/Sleep: LCSW to evaluate and provide emotional support             -antipsychotic agents: n/a             -  trazodone as needed   5. Neuropsych/cognition: This patient is capable of making decisions on his own behalf.   6. Skin/Wound Care: Routine skin care checks   7. Fluids/Electrolytes/Nutrition: Routine Is and Os and follow-up chemistries             -continue dys3/advanced to thin liquids; SLP following   8: Hypertension: monitor TID and prn (home meds: amlodipine 10 mg QD, valsartan 320 mg daily)             -continue irbesartan 37.5 mg daily. Increase magnesium glucoante to 500mg  HS    07/28/2022    7:21 AM 07/28/2022    5:45 AM 07/27/2022    7:17 PM  Vitals with BMI  Systolic 126 150 191  Diastolic 77 80 77  Pulse  69 73     -6/2   irbesartan increased to 75mg   -6/25 overall controlled continue current   9: Hyperlipidemia: continue high intensity statin, change simvastatin to atorvastatin 80mg  daily   10: Hyponatremia,Na+ fluctuating , Na+ down to 129 from 136 is off FR will resume        Latest Ref Rng & Units 07/27/2022    5:35 AM 07/24/2022    8:25 AM 07/20/2022    6:10 AM  BMP  Glucose 70 - 99 mg/dL 478  295  621   BUN 8 - 23 mg/dL 18  19  21    Creatinine 0.61 - 1.24 mg/dL 3.08  6.57  8.46   Sodium 135 - 145 mmol/L 136  135  134   Potassium 3.5 - 5.1 mmol/L 4.2  3.7  3.9   Chloride 98 - 111 mmol/L 104  102  99   CO2 22 - 32 mmol/L 25  23  24    Calcium 8.9 - 10.3 mg/dL 8.9  8.9  8.9     9/62 f/u sodium improved today.  - Continue 1500cc FR through  weekend  -recheck bmet Monday  6/24 Na stable 136 11: History of gout: resolved off colchicine   12: DM type II: HGB A1C 6.7, CBGs QID and SSI; carb  modified diet when advanced (home meds include metformin 1000 mg BID) CBG (last 3)  Recent Labs    07/27/22 1710 07/27/22 2116 07/28/22 0542  GLUCAP 110* 141* 133*    Continue magnesium glucoante 500 HS Increase metformin to 500mg  BID 6/25 CBGS well controlled, continue current   13: RLE/RUE weakness:normal LE  venous duplex   14: Tobacco use: occasional cigars, advise cessation   15. Constipation             -resolved. Had bm   type 5 6/1  -only on prn senokot-s and sorbitol   - LBM 6/19, having regular bowel movements  Increase magnesium gluconate to 500mg  HS  -6/24 Pt reports BM yesterday, LBM recorded 2 days ago, continue to monitor  Continue Sorbitol PRN, Senna daily, Miralax  daily   16. R wrist pain  -6/25 Xray wrist without acute changes, signs of osteoarthritis or gouty arthritis noted, pain improved with voltaren- continue    LOS: 34 days A FACE TO FACE EVALUATION WAS PERFORMED  Fanny Dance 07/28/2022, 8:29 AM

## 2022-07-28 NOTE — Progress Notes (Signed)
Physical Therapy Session Note  Patient Details  Name: Jimmy Valentine MRN: [id number] Date of Birth: 1950/10/14  {CHL IP REHAB PT TIME CALCULATION:304800500}  Short Term Goals: {BMW:4132440}  Skilled Therapeutic Interventions/Progress Updates:      Therapy Documentation Precautions:  Precautions Precautions: Fall Precaution Comments: R hemi; dysarthria Restrictions Weight Bearing Restrictions: Yes General:   Vital Signs: Therapy Vitals Temp: 97.7 F (36.5 C) Pulse Rate: 69 Resp: 18 BP: 126/77 Patient Position (if appropriate): Lying Oxygen Therapy SpO2: 96 % O2 Device: Room Air Pain:   Mobility:   Locomotion :    Trunk/Postural Assessment :    Balance:   Exercises:   Other Treatments:      Therapy/Group: {Therapy/Group:3049007}  Loel Dubonnet 07/28/2022, 9:44 AM

## 2022-07-28 NOTE — Progress Notes (Signed)
Occupational Therapy Session Note  Patient Details  Name: Jimmy Valentine MRN: [id number] Date of Birth: [birth year]  Today's Date: 07/28/2022 OT Individual Time: 4401-0272 OT Individual Time Calculation (min): 74 min    Short Term Goals: Week 5:  OT Short Term Goal 1 (Week 5): STGs = LTGs  Skilled Therapeutic Interventions/Progress Updates:   Pt completing sponge bathing and LB dressing with nursing student bed level upon OT arrival.  Pt applied moist heat to R UE to decrease stiffness for NMRE. Soft tissue mobilization distal R hand with now 3/4 range passively much improved from yesterday. Applied Saebo StimOne to deltoids for application of [facility] Stroke Recovery Air splint for scapular and proximal facilitation. Protraction/retraction, sh extemsion and horizontal add 10 reps x 2 sets with AAROM. Removed and performed supine to sit with adapted methods with increased time and effort but CGA with bed only slightly raised at head. OT donned R AFO and tennis shoes. Pt sit to stand with hemi walker and perfomred PNF with R UE clasped hands with clniician in horizontal planes x 10 reps with better than trace activation. SPT with hemi walker with CGA bed to w/c. Left pt with needs and safety measures in reach.    Saebo StimOne parameters:    no adverse skin reactions.  330 pulse width 35 Hz pulse rate On 8 sec/ off 8 sec Ramp up/ down 2 sec Symmetrical Biphasic wave form  Max intensity at 500 Ohm load  Pain: intermittent joint pain in R UE, back, knees with moist heat, soft tissue massage, repositioning, ktape in place, 0/10 pain at rest   Therapy Documentation Precautions:  Precautions Precautions: Fall Precaution Comments: R hemi; dysarthria Restrictions Weight Bearing Restrictions: Yes   Therapy/Group: Individual Therapy  Vicenta Dunning 07/28/2022, 8:00 AM

## 2022-07-28 NOTE — Progress Notes (Signed)
Physical Therapy Session Note  Patient Details  Name: Jimmy Valentine MRN: [id number] Date of Birth: May 15, 1950  {CHL IP REHAB PT TIME CALCULATION:304800500}  Short Term Goals: {WUJ:8119147}  Skilled Therapeutic Interventions/Progress Updates:      Therapy Documentation Precautions:  Precautions Precautions: Fall Precaution Comments: R hemi; dysarthria Restrictions Weight Bearing Restrictions: Yes General:   Vital Signs: Therapy Vitals Temp: 97.7 F (36.5 C) Pulse Rate: 69 Resp: 18 BP: 126/77 Patient Position (if appropriate): Lying Oxygen Therapy SpO2: 96 % O2 Device: Room Air Pain:   Mobility:   Locomotion :    Trunk/Postural Assessment :    Balance:   Exercises:   Other Treatments:      Therapy/Group: {Therapy/Group:3049007}  Loel Dubonnet 07/28/2022, 9:41 AM

## 2022-07-29 ENCOUNTER — Other Ambulatory Visit (HOSPITAL_COMMUNITY): Payer: Self-pay

## 2022-07-29 LAB — GLUCOSE, CAPILLARY
Glucose-Capillary: 115 mg/dL — ABNORMAL HIGH (ref 70–99)
Glucose-Capillary: 81 mg/dL (ref 70–99)
Glucose-Capillary: 200 mg/dL — ABNORMAL HIGH (ref 70–99)
Glucose-Capillary: 98 mg/dL (ref 70–99)

## 2022-07-29 MED ORDER — COLCHICINE 0.6 MG PO TABS: 0.6000 mg | ORAL_TABLET | Freq: Every day | ORAL | Status: DC

## 2022-07-29 MED ORDER — CLOPIDOGREL BISULFATE 75 MG PO TABS
75.0000 mg | ORAL_TABLET | Freq: Every day | ORAL | 0 refills | Status: DC
  Filled 2022-07-29: qty 30, 30d supply, fill #0

## 2022-07-29 MED ORDER — METFORMIN HCL 500 MG PO TABS: 500.0000 mg | ORAL_TABLET | Freq: Two times a day (BID) | ORAL | 0 refills | Status: DC

## 2022-07-29 MED ORDER — COLCHICINE 0.6 MG PO TABS
0.6000 mg | ORAL_TABLET | Freq: Every day | ORAL | Status: DC
  Administered 2022-07-29 – 2022-07-30 (×2): 0.6 mg via ORAL
  Filled 2022-07-29 (×2): qty 1

## 2022-07-29 MED ORDER — DICLOFENAC SODIUM 1 % EX GEL: 2.0000 g | Freq: Three times a day (TID) | CUTANEOUS | Status: DC

## 2022-07-29 MED ORDER — IRBESARTAN 75 MG PO TABS
75.0000 mg | ORAL_TABLET | Freq: Every day | ORAL | 0 refills | Status: DC
  Filled 2022-07-29: qty 30, 30d supply, fill #0

## 2022-07-29 MED ORDER — SENNOSIDES-DOCUSATE SODIUM 8.6-50 MG PO TABS: 2.0000 | ORAL_TABLET | Freq: Two times a day (BID) | ORAL | Status: DC

## 2022-07-29 MED ORDER — ATORVASTATIN CALCIUM 80 MG PO TABS
80.0000 mg | ORAL_TABLET | Freq: Every day | ORAL | 0 refills | Status: DC
  Filled 2022-07-29: qty 30, 30d supply, fill #0

## 2022-07-29 MED ORDER — MAGNESIUM OXIDE 400 MG PO TABS
400.0000 mg | ORAL_TABLET | Freq: Every day | ORAL | 0 refills | Status: DC
  Filled 2022-07-29: qty 120, 120d supply, fill #0

## 2022-07-29 MED ORDER — ACETAMINOPHEN 325 MG PO TABS: 325.0000 mg | ORAL_TABLET | ORAL | Status: DC | PRN

## 2022-07-29 NOTE — Progress Notes (Signed)
Patient ID: KHIAN REMO, male   DOB: 1950-06-10, 72 y.o.   MRN: [id number]  [facility] orders sent to [facility].

## 2022-07-29 NOTE — Progress Notes (Signed)
Physical Therapy Session Note  Patient Details  Name: Jimmy Valentine MRN: [id number] Date of Birth: [birth year]  Today's Date: 07/29/2022 PT Individual Time: 0803-0905 PT Individual Time Calculation (min): 62 min   Short Term Goals: Week 4:  PT Short Term Goal 1 (Week 4): STG = LTG d/t ELOS  Skilled Therapeutic Interventions/Progress Updates: Patient in [facility] with nurse providing medication on entrance to room. Patient alert and agreeable to PT session.   Patient reported no pain at beginning of session.  Therapeutic Activity: Transfers: Pt performed sit<>stand transfers throughout session with RW and no AD with supervision for safety, and minA to adjust R LE and R UE onto RW hemi-grip.  Pt. Ambulated a short distance (roughly 20' from hi/low mat to stairs with close supervision/CGA for safety. Pt presented with decreased R LE musculature activation per report of R LE feeling weaker. Patient advances R LE with decreased hip/knee flexion, and mostly circumducts R LE to step forward.  Stair Navigation: 2 trials - Ascending - pt with close supervision for safety (use of LHR). Pt required cues to ensure R LE is entirely on step as to avoid hyperextension at R knee. Pt has shown vast improvement with stair navigation while ascending since stair training initiation. Pt presented with R LE in extension with slight hip hike on R to elevate LE onto step. 2nd trial, pt still in extension but noted slight R hip/knee flexion - Descending - pt with minA and mod cues to avoid flexion at L knee prior to advancing R LE past standing step. Pt presented with increase assistance this session than reported from last session (pt reported that R LE felt heavier this morning. Pt with R LE abductor weakness per tendency to bring LE towards midline when stepping and required minA to advance past step and to safely place on step. 2 trials with same assistance required.  Car Transfer: Pt provided with demonstration of  safe transfer by PTA. Pt cued to sit on seat first, and then to use L UE on ceiling handle in car (bar on car in ortho gym). Patient that able to bring L LE into car, but required modA to elevate R LE into car per pt report of inability to elevate it with L UE alone.  Patient in [facility] at end of session with brakes locked, and all needs within reach.      Therapy Documentation Precautions:  Precautions Precautions: Fall Precaution Comments: R hemi; dysarthria Restrictions Weight Bearing Restrictions: No  Therapy/Group: Individual Therapy  Demaris Bousquet PTA 07/29/2022, 12:37 PM

## 2022-07-29 NOTE — Progress Notes (Addendum)
PROGRESS NOTE   Subjective/Complaints: No new complaints today. Continues to have intermittent wrist pain with activities. No new complaints or concerns noted.   ROS: ROS: Patient denies fever, rash, sore throat, blurred vision, dizziness, nausea, vomiting, diarrhea, cough,  abdominal pain, shortness of breath or chest pain, back/neck pain, headache, or mood change.  + wrist pain right- improved  Objective:   DG Wrist Complete Right  Result Date: 07/27/2022 CLINICAL DATA:  Pain EXAM: RIGHT WRIST - COMPLETE 3+ VIEW COMPARISON:  None Available. FINDINGS: No displaced fracture or dislocation is seen. No significant focal erosive changes are noted. Small smoothly marginated calcifications are noted adjacent to scaphoid and distal end of radius. Faint chondrocalcinosis is seen in the wrist. Degenerative changes are noted with bony spurs in first carpometacarpal joint. IMPRESSION: No recent fracture or dislocation is seen. There are small faint calcifications adjacent to scaphoid, distal radius and distal ulna suggesting possible cartilage calcifications due to osteoarthritis or gouty arthritis. No significant erosive changes are noted. 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 Electronically Signed   By: Ernie Avena M.D.   On: 07/27/2022 16:25   Recent Labs    07/27/22 0535  WBC 8.1  HGB 14.0  HCT 40.2  PLT 228     Recent Labs    07/27/22 0535  NA 136  K 4.2  CL 104  CO2 25  GLUCOSE 131*  BUN 18  CREATININE 0.96  CALCIUM 8.9      Intake/Output Summary (Last 24 hours) at 07/29/2022 0834 Last data filed at 07/29/2022 0716 Gross per 24 hour  Intake 716 ml  Output 415 ml  Net 301 ml         Physical Exam: Vital Signs Blood pressure (!) 145/76, pulse 70, temperature 97.8 F (36.6 C), resp. rate 18, height 5\' 9"  (1.753 m), weight 94.1 kg, SpO2 95 %.  Gen: no distress,  normal appearing,sitting in chair HEENT: oral mucosa pink and moist, NCAT Cardio: RRR Chest: CTAB, normal effort, normal rate of breathing Abd: soft, non-distended, +BS Ext: no edema Psych: pleasant, normal affect Skin: intact  Neurologic: Awake and alert, follows commands,  No apparent word finding deficits. Sensory exam normal sensation to light touch  in bilateral upper and lower extremities  Musculoskeletal: Full range of motion in all 4 extremities. No erythema noted R wrist, mild pain with Passive ROM R wrist-uncharged   Assessment/Plan: 1. Functional deficits which require 3+ hours per day of interdisciplinary therapy in a comprehensive inpatient rehab setting. Physiatrist is providing close team supervision and 24 hour management of active medical problems listed below. Physiatrist and rehab team continue to assess barriers to discharge/monitor patient progress toward functional and medical goals  Care Tool:  Bathing    Body parts bathed by patient: Abdomen, Chest, Right arm, Front perineal area, Right upper leg, Left upper leg, Face, Left arm, Left lower leg, Right lower leg, Buttocks   Body parts bathed by helper: Left arm, Buttocks, Right lower leg, Left lower leg     Bathing assist Assist Level: Contact Guard/Touching assist     Upper Body Dressing/Undressing Upper body dressing   What is  the patient wearing?: Pull over shirt    Upper body assist Assist Level: Set up assist    Lower Body Dressing/Undressing Lower body dressing      What is the patient wearing?: Incontinence brief, Pants     Lower body assist Assist for lower body dressing: Minimal Assistance - Patient > 75%     Toileting Toileting    Toileting assist Assist for toileting: Moderate Assistance - Patient 50 - 74%     Transfers Chair/bed transfer  Transfers assist  Chair/bed transfer activity did not occur: Safety/medical concerns  Chair/bed transfer assist level: Contact  Guard/Touching assist     Locomotion Ambulation   Ambulation assist   Ambulation activity did not occur: Safety/medical concerns  Assist level: Minimal Assistance - Patient > 75% Assistive device: Walker-rolling Max distance: 40   Walk 10 feet activity   Assist  Walk 10 feet activity did not occur: Safety/medical concerns  Assist level: Minimal Assistance - Patient > 75% Assistive device: Walker-rolling   Walk 50 feet activity   Assist Walk 50 feet with 2 turns activity did not occur: Safety/medical concerns         Walk 150 feet activity   Assist Walk 150 feet activity did not occur: Safety/medical concerns         Walk 10 feet on uneven surface  activity   Assist Walk 10 feet on uneven surfaces activity did not occur: Safety/medical concerns   Assist level: Minimal Assistance - Patient > 75% (outdoor surface.) Assistive device: Development worker, international aid     Assist Is the patient using a wheelchair?: Yes Type of Wheelchair: Manual    Wheelchair assist level: Minimal Assistance - Patient > 75% Max wheelchair distance: 50 ft    Wheelchair 50 feet with 2 turns activity    Assist        Assist Level: Dependent - Patient 0%   Wheelchair 150 feet activity     Assist      Assist Level: Dependent - Patient 0%   Blood pressure (!) 145/76, pulse 70, temperature 97.8 F (36.6 C), resp. rate 18, height 5\' 9"  (1.753 m), weight 94.1 kg, SpO2 95 %.  Medical Problem List and Plan: 1. 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 Large basal ganglia infarct. 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  Minimal RUE return ~55mo post CVA              -patient may  shower             -ELOS/Goals: extended 6/27 due to needing to ambulate into/within bathrooms             -Continue WHO and PRAFO              -Continue CIR therapies including PT, OT     -AFO    -Team conference today please see physician documentation under team conference tab, met with team  to discuss problems,progress, and goals. Formulized individual treatment plan based on medical history, underlying problem and comorbidities.   -DC tomorrow planned   2.  Antithrombotics: -DVT/anticoagulation:  Pharmaceutical: Lovenox 4omg daily              -antiplatelet therapy: Aspirin and Plavix for three months followed by aspirin alone (started 5/20)   3. Pain Management: Tylenol, Robaxin as needed   4. Mood/Behavior/Sleep: LCSW to evaluate and provide emotional  support             -antipsychotic agents: n/a             -trazodone as needed   5. Neuropsych/cognition: This patient is capable of making decisions on his own behalf.   6. Skin/Wound Care: Routine skin care checks   7. Fluids/Electrolytes/Nutrition: Routine Is and Os and follow-up chemistries             -continue dys3/advanced to thin liquids; SLP following   8: Hypertension: monitor TID and prn (home meds: amlodipine 10 mg QD, valsartan 320 mg daily)             -continue irbesartan 37.5 mg daily. Increase magnesium glucoante to 500mg  HS    07/29/2022    5:11 AM 07/28/2022    7:47 PM 07/28/2022    2:09 PM  Vitals with BMI  Weight 207 lbs 7 oz    Systolic 145 141 562  Diastolic 76 78 73  Pulse 70 75 70     -6/2   irbesartan increased to 75mg   -6/26 fair control, continue current regimen for now, will not increase medications today as would like to avoid causing hypotension  9: Hyperlipidemia: continue high intensity statin, change simvastatin to atorvastatin 80mg  daily   10: Hyponatremia,Na+ fluctuating , Na+ down to 129 from 136 is off FR will resume        Latest Ref Rng & Units 07/27/2022    5:35 AM 07/24/2022    8:25 AM 07/20/2022    6:10 AM  BMP  Glucose 70 - 99 mg/dL 130  865  784   BUN 8 - 23 mg/dL 18  19  21    Creatinine 0.61 - 1.24 mg/dL  6.96  2.95  2.84   Sodium 135 - 145 mmol/L 136  135  134   Potassium 3.5 - 5.1 mmol/L 4.2  3.7  3.9   Chloride 98 - 111 mmol/L 104  102  99   CO2 22 - 32 mmol/L 25  23  24    Calcium 8.9 - 10.3 mg/dL 8.9  8.9  8.9     1/32 f/u sodium improved today.  - Continue 1500cc FR through weekend  -recheck bmet Monday  6/24 Na stable 136 11: History of gout: resolved off colchicine  -Restart colchicine, start at 0.6mg  daily   12: DM type II: HGB A1C 6.7, CBGs QID and SSI; carb  modified diet when advanced (home meds include metformin 1000 mg BID) CBG (last 3)  Recent Labs    07/28/22 1629 07/28/22 2056 07/29/22 0604  GLUCAP 157* 121* 115*    Continue magnesium glucoante 500 HS Increase metformin to 500mg  BID 6/26 CBGS under control control, continue current    13: RLE/RUE weakness:normal LE  venous duplex   14: Tobacco use: occasional cigars, advise cessation   15. Constipation             -resolved. Had bm   type 5 6/1  -only on prn senokot-s and sorbitol   - LBM 6/19, having regular bowel movements  Increase magnesium gluconate to 500mg  HS  -6/24 Pt reports BM yesterday, LBM recorded 2 days ago, continue to monitor  Continue Sorbitol PRN, Senna daily, Miralax  daily  -6/26 BP yesterday and today, improved, continue regimen   16. R wrist pain  -6/25 Xray wrist without acute changes, signs of osteoarthritis or gouty arthritis noted, pain improved with voltaren- continue  -Restart colchicine, start at 0.6mg  daily  LOS: 35 days A FACE TO FACE EVALUATION WAS PERFORMED  Fanny Dance 07/29/2022, 8:34 AM

## 2022-07-29 NOTE — Progress Notes (Signed)
Patient ID: Jimmy Valentine, male   DOB: [birth year], 72 y.o.   MRN: [id number]  Team Conference Report to Patient/Family  Team Conference discussion was reviewed with the patient and caregiver, including goals, any changes in plan of care and target discharge date.  Patient and caregiver express understanding and are in agreement.  The patient has a target discharge date of 07/30/22.  SW met with patient and provided team conference updates. Sw called daughter to provide same updates. Patient DME has been delivered in the room. Daughter will be present for discharge between 9 AM-11 AM. No additional questions or concerns.   Andria Rhein 07/29/2022, 2:49 PM

## 2022-07-29 NOTE — Progress Notes (Signed)
Slept well last night. CBG monitored. Assisted with BR needs. No new changes to report. Safety maintained.

## 2022-07-29 NOTE — Progress Notes (Signed)
Physical Therapy Discharge Summary  Patient Details  Name: MAYANK TEUSCHER MRN: [id number] Date of Birth: 02-25-1950  Date of Discharge from PT service:July 29, 2022  {CHL IP REHAB PT TIME CALCULATION:304800500}   Patient has met {NUMBERS 0-12:18577} of 11 long term goals due to improved activity tolerance, improved balance, improved postural control, increased strength, decreased pain, improved attention, and improved awareness.  Patient to discharge at a wheelchair level Supervision and ambulatory level at [facility].   Patient's care partner is independent to provide the necessary physical assistance at discharge.  Reasons goals not met: ***  Recommendation:  Patient will benefit from ongoing skilled PT services in home health setting followed by neuro outpatient to continue to advance safe functional mobility, address ongoing impairments in strength, coordination, balance, activity tolerance, cognition, safety awareness, and to and minimize fall risk.  Equipment: RW, custom w/c  Reasons for discharge: treatment goals met and discharge from hospital  Patient/family agrees with progress made and goals achieved: Yes  PT Discharge Precautions/Restrictions   Vital Signs Therapy Vitals Temp: 97.8 F (36.6 C) Pulse Rate: 70 Resp: 18 BP: (!) 145/76 Patient Position (if appropriate): Lying Oxygen Therapy SpO2: 95 % O2 Device: Room Air Pain   Pain Interference   Vision/Perception     Cognition   Sensation   Motor     Mobility   Locomotion     Trunk/Postural Assessment     Balance Balance Balance Assessed: Yes Extremity Assessment            Loel Dubonnet 07/29/2022, 8:38 AM

## 2022-07-29 NOTE — Progress Notes (Signed)
Inpatient Rehabilitation Discharge Medication Review by a Pharmacist  A complete drug regimen review was completed for this patient to identify any potential clinically significant medication issues.  High Risk Drug Classes Is patient taking? Indication by Medication  Antipsychotic No   Anticoagulant No   Antibiotic No   Opioid No   Antiplatelet Yes Aspirin, clopidogrel - stroke ppx  Hypoglycemics/insulin Yes Metformin - DM  Vasoactive Medication Yes irbesartan, amlodipine - HTN   Chemotherapy No   Other Yes Diclofenac gel - topical pain Colchicine - PRN gout Magonate - sleep Pantoprazole - GERD Senokot-S - constipation Atorvastatin - HLD     Type of Medication Issue Identified Description of Issue Recommendation(s)  Drug Interaction(s) (clinically significant)     Duplicate Therapy     Allergy     No Medication Administration End Date     Incorrect Dose     Additional Drug Therapy Needed     Significant med changes from prior encounter (inform family/care partners about these prior to discharge). Simvastatin > atorvastatin Sildenafil, hydrochlorothiazide discontinued Communicate medication changes with patient/family at discharge  Other       Clinically significant medication issues were identified that warrant physician communication and completion of prescribed/recommended actions by midnight of the next day:  Yes  Name of provider notified for urgent issues identified: Wendi Maya, PA   Provider Method of Notification: secure chat   Pharmacist comments:  Per neurology recommendation, continue aspirin 81 mg daily and clopidogrel 75 mg daily for secondary stroke prevention for 3 months then aspirin alone >> PA to hold off adding EOT to prescription (defers to neurology F/U) Valsartan PTA changed to irbesartan (inpatient formulary alternative) > dose reduced from PTA dose, PA prefers to change to irbesartan at discharge  Time spent performing this drug regimen  review (minutes): 20   Thank you for allowing pharmacy to be a part of this patient's care.  Thelma Barge, PharmD Clinical Pharmacist

## 2022-07-29 NOTE — Patient Care Conference (Signed)
Inpatient RehabilitationTeam Conference and Plan of Care Update Date: 07/29/2022   Time: 10:23 AM    Patient Name: Jimmy Valentine      Medical Record Number: [id number]  Date of Birth: 09-25-1950 Sex: Male         Room/Bed: 4W20C/4W20C-01 Payor Info: Payor: [employer] MEDICARE / Plan: [employer] MEDICARE HMO/PPO / Product Type: *No Product type* /    Admit Date/Time:  06/24/2022  4:57 PM  Primary Diagnosis:  Acute ischemic left MCA stroke [facility])  Hospital Problems: Principal Problem:   Acute ischemic left MCA stroke [facility]) Active Problems:   Aphasia due to acute cerebrovascular accident (CVA) [facility])   Hyponatremia    Expected Discharge Date: Expected Discharge Date: 07/30/22  Team Members Present: Physician leading conference: Dr. Fanny Dance Social Worker Present: Lavera Guise, BSW Nurse Present: Chana Bode, RN PT Present: Ralph Leyden, PT OT Present: Candee Furbish, OT SLP Present: Feliberto Gottron, SLP PPS Coordinator present : Fae Pippin, SLP     Current Status/Progress Goal Weekly Team Focus  Bowel/Bladder   Continent of B/B. LBm 07/28/22   Maintain Continence   Assist with toileting needs.    Swallow/Nutrition/ Hydration               ADL's   continuing ktape for R sh with + results as well as estim, has new custom loaner chair with R paqdded lap tray, CGA for SPT with hemi walker and RW to toilet and shower bench, mod I UB self care seated, min A LB self care except AFO and tennis shoes   Min A overall   Continued progress with amb with AD to access toilet closet at home, R NMRE HEP for home cqarryover    Mobility   Bed mobility = minA; squat pivot = CGA, sit<>stand = minA (pt can statically stand without UE support and with close supervision for safety - about 1 minute at least). Ambulation using RW with hand splin t= MinA for safety/ balance and intermittent advancement of RLE with AFO donned; WC mobility = distant supervision   currently set to MinA  overall transfers & gait, supervision w/c level  Barriers: R hemi with improving R LE flexor tone, RUE flaccid; standing balance, safety awareness /// Weekly focus: improving LOA, continued R hemibody NMR, ambulation, balance, overall strength, stair navigation, family ed!, d/c home Thu    Communication                Safety/Cognition/ Behavioral Observations               Pain   Denies pain   Remain pain free   Assess Q$ and prn    Skin   Skin Intact   Maintain integrity  Assess QS and prn.      Discharge Planning:  Patient discharging home on Thursday. Family education complete. Assistance from children, patient potential to hire [facility].   Team Discussion: Patient with right wrist swelling and tenderness with movement; xray confirmed gouty arthritis, voltaren gel prn and brace in place. Medically stable post left MCA CVA.  Patient on target to meet rehab goals: yes, currently CGA for stand pivot transfers with hemi-walker. Completes self care with mod I assist and goals for discharge set for mi assist overall.  *See Care Plan and progress notes for long and short-term goals.   Revisions to Treatment Plan:  Brace/AFO Kinesio tape right shoulder E-stim Loaner wheelchair with lap tray    Teaching Needs: Safety, medications, dietary modifications, transfers,  toileting, etc.   Current Barriers to Discharge: Decreased caregiver support and Home enviroment access/layout  Possible Resolutions to Barriers: Family education HH follow up services QMV:[id number] wheelchair, drop arm BSC, TTB, RW     Medical Summary Current Status: L MCA CVA, sapinration PNA, lethargy, incontinence, BPH  Barriers to Discharge: Medical stability  Barriers to Discharge Comments: L MCA CVA, sapinration PNA, lethargy, incontinence, BPH Possible Resolutions to [employer] Focus: monitor CBGs, monitor BP , monitor bowel function, voltaren gel   Continued Need for Acute Rehabilitation Level  of Care: The patient requires daily medical management by a physician with specialized training in physical medicine and rehabilitation for the following reasons: Direction of a multidisciplinary physical rehabilitation program to maximize functional independence : Yes Medical management of patient stability for increased activity during participation in an intensive rehabilitation regime.: Yes Analysis of laboratory values and/or radiology reports with any subsequent need for medication adjustment and/or medical intervention. : Yes   I attest that I was present, lead the team conference, and concur with the assessment and plan of the team.   Chana Bode B 07/29/2022, 3:14 PM

## 2022-07-29 NOTE — Progress Notes (Signed)
Occupational Therapy Discharge Summary  Patient Details  Name: Jimmy Valentine MRN: [id number] Date of Birth: [birth year]  Date of Discharge from OT service:July 29, 2022  Today's Date: 07/29/2022 OT Individual Time: 1415-1500, 1415-1500 OT Individual Time Calculation (min): 45 min, 45 min    Patient has met 11 of 11 long term goals due to improved activity tolerance, improved balance, postural control, ability to compensate for deficits, functional use of  RIGHT upper, RIGHT lower, LEFT upper, and LEFT lower extremity, and improved coordination.  Patient to discharge at [facility] Assist level.  Patient's care partner is independent to provide the necessary physical assistance at discharge.    Reasons goals not met: n/a   Recommendation:  Patient will benefit from ongoing skilled OT services in home health setting to continue to advance functional skills in the area of BADL, iADL, and Reduce care partner burden.  Equipment: Paediatric nurse, commode, RW, R RW hand splint, R resting hand splint, LH sponge, Dycem   Reasons for discharge: treatment goals met  Patient/family agrees with progress made and goals achieved: Yes  OT Discharge Precautions/Restrictions  Precautions Precautions: Fall Precaution Comments: R hemi; dysarthria Restrictions Weight Bearing Restrictions: No  Vital Signs Therapy Vitals Temp: 97.8 F (36.6 C) Temp Source: Oral Pulse Rate: 65 Resp: 17 BP: 134/63 Patient Position (if appropriate): Lying Oxygen Therapy SpO2: 96 % O2 Device: Room Air Pain Pain Assessment Pain Scale: 0-10 Pain Score: 0-No pain ADL ADL Equipment Provided: Long-handled sponge Eating: Modified independent Where Assessed-Eating: Wheelchair Grooming: Supervision/safety Where Assessed-Grooming: Sitting at sink, Standing at sink Upper Body Bathing: Modified independent Where Assessed-Upper Body Bathing: Shower Lower Body Bathing: Supervision/safety Where Assessed-Lower Body  Bathing: Shower Upper Body Dressing: Modified independent (Device) Where Assessed-Upper Body Dressing: Wheelchair Lower Body Dressing: Contact guard Where Assessed-Lower Body Dressing: Wheelchair Toileting: Supervision/safety Where Assessed-Toileting: Teacher, adult education: Close supervision Toilet Transfer Method: Surveyor, minerals: Raised toilet seat, Grab bars Tub/Shower Transfer: Close supervison Web designer Method: Ship broker: Emergency planning/management officer, Acupuncturist: Close supervision Film/video editor Method: Warden/ranger: Emergency planning/management officer, Grab bars ADL Comments: assist for AFO and tennis shoes, reacher use now for pants and underwear Vision Baseline Vision/History: 1 Wears glasses Patient Visual Report: No change from baseline Vision Assessment?: No apparent visual deficits Perception  Perception: Impaired Inattention/Neglect: Does not attend to right side of body Praxis Praxis: Impaired Praxis Impairment Details: Motor planning;Initiation Cognition Cognition Overall Cognitive Status: Within Functional Limits for tasks assessed Arousal/Alertness: Awake/alert Orientation Level: Person;Situation;Place Memory: Appears intact Memory Impairment: Retrieval deficit Attention: Focused;Sustained;Selective Focused Attention: Appears intact Sustained Attention Impairment: Verbal complex;Verbal basic;Functional basic;Functional complex Selective Attention: Appears intact Selective Attention Impairment: Verbal basic;Functional basic Awareness: Appears intact Safety/Judgment: Appears intact Brief Interview for Mental Status (BIMS) Repetition of Three Words (First Attempt): 3 Temporal Orientation: Year: Correct Temporal Orientation: Month: Accurate within 5 days Temporal Orientation: Day: Correct Recall: "Sock": Yes, no cue required Recall: "Blue": Yes, no cue required Recall:  "Bed": Yes, no cue required BIMS Summary Score: 15 Sensation Sensation Light Touch: Appears Intact Hot/Cold: Appears Intact Proprioception: Impaired Detail Proprioception Impaired Details: Impaired RUE;Impaired RLE Stereognosis: Impaired by gross assessment Coordination Gross Motor Movements are Fluid and Coordinated: No Fine Motor Movements are Fluid and Coordinated: No Coordination and Movement Description: continues to present with R hemi deficits Finger Nose Finger Test: unable R, intact L Motor  Motor Motor: Other (comment);Abnormal tone;Abnormal postural alignment and control Motor - Discharge  Observations: Pt with decreased R LE coordination/strength and tendency to circumduct (warming up LE musculature improves gait pattern). Motor observations have vastly improved from evaluation Mobility  Bed Mobility Bed Mobility: Rolling Left;Rolling Right;Supine to Sit Rolling Right: Independent with assistive device Rolling Left: Supervision/Verbal cueing Supine to Sit: Supervision/Verbal cueing Transfers Sit to Stand: Supervision/Verbal cueing;Contact Guard/Touching assist Stand to Sit: Contact Guard/Touching assist;Supervision/Verbal cueing  Trunk/Postural Assessment  Cervical Assessment Cervical Assessment: Within Functional Limits Thoracic Assessment Thoracic Assessment: Exceptions to [facility] Lumbar Assessment Lumbar Assessment: Exceptions to [facility] Postural Control Righting Reactions: delayed and insufficient  Balance Balance Balance Assessed: Yes Static Sitting Balance Static Sitting - Balance Support: Feet supported;No upper extremity supported Static Sitting - Level of Assistance: 5: Stand by assistance Dynamic Sitting Balance Dynamic Sitting - Balance Support: Feet supported;No upper extremity supported;During functional activity Dynamic Sitting - Level of Assistance: 5: Stand by assistance Sitting balance - Comments: Right lateral lean Static Standing Balance Static  Standing - Balance Support: During functional activity;Bilateral upper extremity supported Static Standing - Level of Assistance: 5: Stand by assistance Dynamic Standing Balance Dynamic Standing - Balance Support: Bilateral upper extremity supported Dynamic Standing - Level of Assistance: 5: Stand by assistance;4: Min assist Extremity/Trunk Assessment  OT Intervention/Education:  Session 1:  Pt up in w/c and open to gym session. OT transported pt to and from gym for time management. Pt performed transfer with amb with L hemi walker with CGA 10 ft to and from w/c. Sit to supine on mat with close S and supine to sit with CGA. Applied Saebo Stim One to wrist extensors 10 min while working on bridging for LE dressing and hip strength. OT performed AAROM scap, sh, and elbow with improving return noted. Once back in room, pt performed simple cup to mouth and brush to hair with hand over hand support with R UE 10 reps each. Pt left up in w/c with 1/2 lap tray, needs and nurse call button in reach.  Saebo StimOne parameters:    no adverse skin reactions.  330 pulse width 35 Hz pulse rate On 8 sec/ off 8 sec Ramp up/ down 2 sec Symmetrical Biphasic wave form  Max intensity at 500 Ohm load  Pain: denies all pain this session    Session 2:  Pt seen for final OT session for discharge from CIR. Pt requesting shower. Amb this session OOB to commode, in and out of stall shower to TTB back to w/c with set up cues but close S/CGA consistency with R sided adapted walker splint. When performing self supported standing, pt requires close S  for bowel hygiene, buttocks bathing and LB garment mngt pulling up and down over hips. OT replaced ktape to R shoulder region for prevention of further pain and subluxation. This session mod I for UB dressing and bathing and CGA with cues using reacher excluding socks, shoes and AFO for LB self care.  Standing for oral care sink side with close S with L sided hemi  walker. Photographed ktaping and sent link for obtaining. Left pt bed level for rest at end of session with all bed mobility updated. Needs, bed exit and nurse call button provided.   Pain: denies all pain this session   Vicenta Dunning 07/29/2022, 9:42 PM

## 2022-07-29 NOTE — Plan of Care (Signed)
  Problem: RH Eating Goal: LTG Patient will perform eating w/assist, cues/equip (OT) Description: LTG: Patient will perform eating with assist, with/without cues using equipment (OT) Outcome: Completed/Met   Problem: RH Grooming Goal: LTG Patient will perform grooming w/assist,cues/equip (OT) Description: LTG: Patient will perform grooming with assist, with/without cues using equipment (OT) Outcome: Completed/Met Flowsheets (Taken 07/29/2022 2011) LTG: Pt will perform grooming with assistance level of: Independent with assistive device    Problem: RH Bathing Goal: LTG Patient will bathe all body parts with assist levels (OT) Description: LTG: Patient will bathe all body parts with assist levels (OT) Outcome: Completed/Met   Problem: RH Dressing Goal: LTG Patient will perform upper body dressing (OT) Description: LTG Patient will perform upper body dressing with assist, with/without cues (OT). Outcome: Completed/Met Goal: LTG Patient will perform lower body dressing w/assist (OT) Description: LTG: Patient will perform lower body dressing with assist, with/without cues in positioning using equipment (OT) Outcome: Completed/Met   Problem: RH Functional Use of Upper Extremity Goal: LTG Patient will use RT/LT upper extremity as a (OT) Description: LTG: Patient will use right/left upper extremity as a stabilizer/gross assist/diminished/nondominant/dominant level with assist, with/without cues during functional activity (OT) Outcome: Completed/Met   Problem: RH Toilet Transfers Goal: LTG Patient will perform toilet transfers w/assist (OT) Description: LTG: Patient will perform toilet transfers with assist, with/without cues using equipment (OT) Outcome: Completed/Met   Problem: RH Tub/Shower Transfers Goal: LTG Patient will perform tub/shower transfers w/assist (OT) Description: LTG: Patient will perform tub/shower transfers with assist, with/without cues using equipment (OT) Outcome:  Completed/Met

## 2022-07-30 ENCOUNTER — Other Ambulatory Visit (HOSPITAL_COMMUNITY): Payer: Self-pay

## 2022-07-30 LAB — GLUCOSE, CAPILLARY: Glucose-Capillary: 103 mg/dL — ABNORMAL HIGH (ref 70–99)

## 2022-07-30 NOTE — Progress Notes (Signed)
Inpatient Rehabilitation Care Coordinator Discharge Note   Patient Details  Name: Jimmy Valentine MRN: [id number] Date of Birth: 1950-02-04   Discharge location: Home  Length of Stay: 36 Days  Discharge activity level: Sup/Cga  Home/community participation: Children  Patient response MW:NUUVOZ Literacy - How often do you need to have someone help you when you read instructions, pamphlets, or other written material from your doctor or pharmacy?: Sometimes  Patient response DG:UYQIHK Isolation - How often do you feel lonely or isolated from those around you?: Never  Services provided included: MD, RD, OT, PT, SLP, RN, CM, TR, Pharmacy, Neuropsych, SW  Financial Services:  Field seismologist Utilized: Private Insurance [employer]  Choices offered to/list presented to: Patient and daughter  Follow-up services arranged:  Home Health, DME Home Health Agency: [facility]    DME : Rolling Walker, Shower Seat, Drop Arm Commode    Patient response to transportation need: Is the patient able to respond to transportation needs?: Yes In the past 12 months, has lack of transportation kept you from medical appointments or from getting medications?: No In the past 12 months, has lack of transportation kept you from meetings, work, or from getting things needed for daily living?: No   Patient/Family verbalized understanding of follow-up arrangements:  Yes  Individual responsible for coordination of the follow-up plan: Patient [phone] or daughter, Jimmy Valentine [phone]  Confirmed correct DME delivered: Andria Rhein 07/30/2022    Comments (or additional information):  Summary of Stay    Date/Time Discharge Planning CSW  07/28/22 1343 Patient discharging home on Thursday. Family education complete. Assistance from children, patient potential to hire [facility]. CJB  07/21/22 1301 Patient extended 1 week to obtain MOD I ADL goals. Patient family education complete. CJB   07/14/22 1414 Daughter attneding family education today. Pt discharging home with assistance from his brother and daughter. CJB  07/07/22 1415 Discharging home with BIL, daughter and other family to assist 24/7. CJB  06/30/22 0951 Discharging home with brother in law, daughter and other family members to assist 24/7. CJB       Andria Rhein

## 2022-07-30 NOTE — Progress Notes (Signed)
PROGRESS NOTE   Subjective/Complaints: Looking forward to DC home today.  NO new concerns.   ROS: ROS: Patient denies fever, rash, sore throat, blurred vision, dizziness, nausea, vomiting, diarrhea, cough,  abdominal pain, shortness of breath or chest pain, back/neck pain, headache, or mood change, worsening motor or sensory function  + wrist pain right- improved  Objective:   No results found. No results for input(s): "WBC", "HGB", "HCT", "PLT" in the last 72 hours.   No results for input(s): "NA", "K", "CL", "CO2", "GLUCOSE", "BUN", "CREATININE", "CALCIUM" in the last 72 hours.    Intake/Output Summary (Last 24 hours) at 07/30/2022 0830 Last data filed at 07/30/2022 1308 Gross per 24 hour  Intake 710 ml  Output 625 ml  Net 85 ml         Physical Exam: Vital Signs Blood pressure (!) 157/80, pulse 72, temperature 97.7 F (36.5 C), temperature source Oral, resp. rate 18, height 5\' 9"  (1.753 m), weight 94.1 kg, SpO2 98 %.  Gen: no distress, normal appearing,sitting in chair HEENT: oral mucosa pink and moist, NCAT Cardio: RRR Chest: CTAB, normal effort, normal rate of breathing Abd: soft, non-distended, +BS-normoactive Psych: pleasant, normal affect Skin: warm and dry   Neurologic: Awake and alert, follows commands,  Intermittent word finding deficits. Sensory exam normal sensation to light touch  in bilateral upper and lower extremities  Musculoskeletal: Full range of motion in all 4 extremities. No erythema noted R wrist, mild pain with Passive ROM R wrist-uncharged   Assessment/Plan: 1. Functional deficits which require 3+ hours per day of interdisciplinary therapy in a comprehensive inpatient rehab setting. Physiatrist is providing close team supervision and 24 hour management of active medical problems listed below. Physiatrist and rehab team continue to assess barriers to discharge/monitor patient progress  toward functional and medical goals  Care Tool:  Bathing    Body parts bathed by patient: Abdomen, Chest, Right arm, Front perineal area, Right upper leg, Left upper leg, Face, Left arm, Left lower leg, Right lower leg, Buttocks   Body parts bathed by helper: Left arm, Buttocks, Right lower leg, Left lower leg     Bathing assist Assist Level: Contact Guard/Touching assist     Upper Body Dressing/Undressing Upper body dressing   What is the patient wearing?: Pull over shirt    Upper body assist Assist Level: Set up assist    Lower Body Dressing/Undressing Lower body dressing      What is the patient wearing?: Incontinence brief, Pants     Lower body assist Assist for lower body dressing: Contact Guard/Touching assist     Toileting Toileting    Toileting assist Assist for toileting: Contact Guard/Touching assist     Transfers Chair/bed transfer  Transfers assist  Chair/bed transfer activity did not occur: Safety/medical concerns  Chair/bed transfer assist level: Contact Guard/Touching assist     Locomotion Ambulation   Ambulation assist   Ambulation activity did not occur: Safety/medical concerns  Assist level: Minimal Assistance - Patient > 75% Assistive device: Walker-rolling Max distance: 75 ft   Walk 10 feet activity   Assist  Walk 10 feet activity did not occur: Safety/medical concerns  Assist level: Contact Guard/Touching  assist Assistive device: Walker-rolling   Walk 50 feet activity   Assist Walk 50 feet with 2 turns activity did not occur: Safety/medical concerns  Assist level: Minimal Assistance - Patient > 75% Assistive device: Walker-rolling    Walk 150 feet activity   Assist Walk 150 feet activity did not occur: Safety/medical concerns         Walk 10 feet on uneven surface  activity   Assist Walk 10 feet on uneven surfaces activity did not occur: Safety/medical concerns   Assist level: Minimal Assistance - Patient  > 75% Assistive device: Walker-rolling   Wheelchair     Assist Is the patient using a wheelchair?: Yes Type of Wheelchair: Manual    Wheelchair assist level: Supervision/Verbal cueing Max wheelchair distance: 150 ft    Wheelchair 50 feet with 2 turns activity    Assist        Assist Level: Supervision/Verbal cueing   Wheelchair 150 feet activity     Assist      Assist Level: Supervision/Verbal cueing   Blood pressure (!) 157/80, pulse 72, temperature 97.7 F (36.5 C), temperature source Oral, resp. rate 18, height 5\' 9"  (1.753 m), weight 94.1 kg, SpO2 98 %.  Medical Problem List and Plan: 1. 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 Large basal ganglia infarct. 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  Minimal RUE return ~26mo post CVA              -patient may  shower             -ELOS/Goals: extended 6/27 due to needing to ambulate into/within bathrooms             -Continue WHO and PRAFO             -Continue CIR therapies including PT, OT     -AFO    -Team conference today please see physician documentation under team conference tab, met with team  to discuss problems,progress, and goals. Formulized individual treatment plan based on medical history, underlying problem and comorbidities.   -DC today, f/u outpatient    2.  Antithrombotics: -DVT/anticoagulation:  Pharmaceutical: Lovenox 4omg daily              -antiplatelet therapy: Aspirin and Plavix for three months followed by aspirin alone (started 5/20)   3. Pain Management: Tylenol, Robaxin as needed   4. Mood/Behavior/Sleep: LCSW to evaluate and provide emotional support             -antipsychotic agents: n/a             -trazodone as needed   5. Neuropsych/cognition: This patient is capable of making decisions on his own behalf.   6. Skin/Wound  Care: Routine skin care checks   7. Fluids/Electrolytes/Nutrition: Routine Is and Os and follow-up chemistries             -continue dys3/advanced to thin liquids; SLP following   8: Hypertension: monitor TID and prn (home meds: amlodipine 10 mg QD, valsartan 320 mg daily)             -continue irbesartan 37.5 mg daily. Increase magnesium glucoante to 500mg  HS    07/30/2022    6:09 AM 07/29/2022    9:11 PM 07/29/2022    5:11 AM  Vitals with BMI  Weight   207 lbs 7 oz  Systolic 157 134 161  Diastolic 80 63 76  Pulse 72 65 70     -6/2   irbesartan increased to 75mg   -6/26 fair control, continue current regimen for now, will not increase medications today as would like to avoid causing hypotension  -6/27 continue current, recheck outpatient with PCP  9: Hyperlipidemia: continue high intensity statin, change simvastatin to atorvastatin 80mg  daily   10: Hyponatremia,Na+ fluctuating , Na+ down to 129 from 136 is off FR will resume        Latest Ref Rng & Units 07/27/2022    5:35 AM 07/24/2022    8:25 AM 07/20/2022    6:10 AM  BMP  Glucose 70 - 99 mg/dL 161  096  045   BUN 8 - 23 mg/dL 18  19  21    Creatinine 0.61 - 1.24 mg/dL 4.09  8.11  9.14   Sodium 135 - 145 mmol/L 136  135  134   Potassium 3.5 - 5.1 mmol/L 4.2  3.7  3.9   Chloride 98 - 111 mmol/L 104  102  99   CO2 22 - 32 mmol/L 25  23  24    Calcium 8.9 - 10.3 mg/dL 8.9  8.9  8.9     7/82 f/u sodium improved today.  - Continue 1500cc FR through weekend  -recheck bmet Monday  6/24 Na stable 136 11: History of gout: resolved off colchicine  -Restart colchicine, start at 0.6mg  daily  -Continue colchicine   12: DM type II: HGB A1C 6.7, CBGs QID and SSI; carb  modified diet when advanced (home meds include metformin 1000 mg BID) CBG (last 3)  Recent Labs    07/29/22 1655 07/29/22 2139 07/30/22 0613  GLUCAP 200* 98 103*    Continue magnesium glucoante 500 HS Increase metformin to 500mg  BID 6/27 controlled overall,  continue current regimen    13: RLE/RUE weakness:normal LE  venous duplex   14: Tobacco use: occasional cigars, advise cessation   15. Constipation             -resolved. Had bm   type 5 6/1  -only on prn senokot-s and sorbitol   - LBM 6/19, having regular bowel movements  Increase magnesium gluconate to 500mg  HS  -6/24 Pt reports BM yesterday, LBM recorded 2 days ago, continue to monitor  Continue Sorbitol PRN, Senna daily, Miralax  daily  -6/26 BP yesterday and today, improved, continue regimen   6/27 BM todqay improved  16. R wrist pain  -6/25 Xray wrist without acute changes, signs of osteoarthritis or gouty arthritis noted, pain improved with voltaren- continue  -Restart colchicine, start at 0.6mg  daily  -Wrist pain controlled overall, continue current regimen, f/u with PCP    LOS: 36 days A FACE TO FACE EVALUATION WAS PERFORMED  Fanny Dance 07/30/2022, 8:30 AM

## 2022-07-30 NOTE — Plan of Care (Signed)
Problem: RH Balance Goal: LTG Patient will maintain dynamic sitting balance (PT) Description: LTG:  Patient will maintain dynamic sitting balance with assistance during mobility activities (PT) Outcome: Completed/Met Flowsheets (Taken 07/29/2022 1747) LTG: Pt will maintain dynamic sitting balance during mobility activities with:: Contact Guard/Touching assist Goal: LTG Patient will maintain dynamic standing balance (PT) Description: LTG:  Patient will maintain dynamic standing balance with assistance during mobility activities (PT) Outcome: Completed/Met Flowsheets (Taken 06/25/2022 1001 by Manhard, Christian P, PT) LTG: Pt will maintain dynamic standing balance during mobility activities with:: Minimal Assistance - Patient > 75%   Problem: Sit to Stand Goal: LTG:  Patient will perform sit to stand with assistance level (PT) Description: LTG:  Patient will perform sit to stand with assistance level (PT) Outcome: Completed/Met Flowsheets (Taken 07/29/2022 1747) LTG: PT will perform sit to stand in preparation for functional mobility with assistance level: Supervision/Verbal cueing   Problem: RH Bed Mobility Goal: LTG Patient will perform bed mobility with assist (PT) Description: LTG: Patient will perform bed mobility with assistance, with/without cues (PT). Outcome: Completed/Met Flowsheets (Taken 06/25/2022 1001 by Manhard, Christian P, PT) LTG: Pt will perform bed mobility with assistance level of: Minimal Assistance - Patient > 75%   Problem: RH Bed to Chair Transfers Goal: LTG Patient will perform bed/chair transfers w/assist (PT) Description: LTG: Patient will perform bed to chair transfers with assistance (PT). Outcome: Completed/Met Flowsheets (Taken 07/29/2022 1747) LTG: Pt will perform Bed to Chair Transfers with assistance level: Contact Guard/Touching assist   Problem: RH Car Transfers Goal: LTG Patient will perform car transfers with assist (PT) Description: LTG: Patient  will perform car transfers with assistance (PT). Outcome: Completed/Met Flowsheets (Taken 07/29/2022 1747) LTG: Pt will perform car transfers with assist:: Minimal Assistance - Patient > 75%   Problem: RH Ambulation Goal: LTG Patient will ambulate in controlled environment (PT) Description: LTG: Patient will ambulate in a controlled environment, # of feet with assistance (PT). Outcome: Completed/Met Flowsheets (Taken 06/25/2022 1001 by Manhard, Christian P, PT) LTG: Pt will ambulate in controlled environ  assist needed:: Minimal Assistance - Patient > 75% LTG: Ambulation distance in controlled environment: 23ft Note: Reaching 65-75 ft max per bout with RW and R hand saddle splint Goal: LTG Patient will ambulate in home environment (PT) Description: LTG: Patient will ambulate in home environment, # of feet with assistance (PT). Outcome: Completed/Met Flowsheets (Taken 06/25/2022 1001 by Manhard, Christian P, PT) LTG: Pt will ambulate in home environ  assist needed:: Minimal Assistance - Patient > 75% LTG: Ambulation distance in home environment: 54ft   Problem: RH Wheelchair Mobility Goal: LTG Patient will propel w/c in controlled environment (PT) Description: LTG: Patient will propel wheelchair in controlled environment, # of feet with assist (PT) Outcome: Completed/Met Flowsheets (Taken 06/25/2022 1001 by Manhard, Christian P, PT) LTG: Pt will propel w/c in controlled environ  assist needed:: Supervision/Verbal cueing LTG: Propel w/c distance in controlled environment: 182ft Goal: LTG Patient will propel w/c in home environment (PT) Description: LTG: Patient will propel wheelchair in home environment, # of feet with assistance (PT). Outcome: Completed/Met Flowsheets Taken 07/29/2022 1747 by Loel Dubonnet, PT Distance: wheelchair distance in controlled environment: 150 Taken 06/25/2022 1001 by Oda Kilts, Christian P, PT LTG: Pt will propel w/c in home environ  assist needed::  Supervision/Verbal cueing LTG: Propel w/c distance in home environment: 29ft   Problem: RH Stairs Goal: LTG Patient will ambulate up and down stairs w/assist (PT) Description: LTG: Patient will ambulate up and down #  of stairs with assistance (PT) Outcome: Completed/Met Flowsheets (Taken 06/25/2022 1001 by Manhard, Christian P, PT) LTG: Pt will ambulate up/down stairs assist needed:: Minimal Assistance - Patient > 75% LTG: Pt will  ambulate up and down number of stairs: 4 with 1 railing on L or as per home setup

## 2022-07-31 DIAGNOSIS — E669 Obesity, unspecified: Secondary | ICD-10-CM | POA: Diagnosis not present

## 2022-07-31 DIAGNOSIS — I358 Other nonrheumatic aortic valve disorders: Secondary | ICD-10-CM | POA: Diagnosis not present

## 2022-07-31 DIAGNOSIS — E119 Type 2 diabetes mellitus without complications: Secondary | ICD-10-CM | POA: Diagnosis not present

## 2022-07-31 DIAGNOSIS — I119 Hypertensive heart disease without heart failure: Secondary | ICD-10-CM | POA: Diagnosis not present

## 2022-07-31 DIAGNOSIS — I69351 Hemiplegia and hemiparesis following cerebral infarction affecting right dominant side: Secondary | ICD-10-CM | POA: Diagnosis not present

## 2022-07-31 DIAGNOSIS — Z7902 Long term (current) use of antithrombotics/antiplatelets: Secondary | ICD-10-CM | POA: Diagnosis not present

## 2022-07-31 DIAGNOSIS — Z6831 Body mass index (BMI) 31.0-31.9, adult: Secondary | ICD-10-CM | POA: Diagnosis not present

## 2022-07-31 DIAGNOSIS — I7090 Unspecified atherosclerosis: Secondary | ICD-10-CM | POA: Diagnosis not present

## 2022-07-31 DIAGNOSIS — Z9181 History of falling: Secondary | ICD-10-CM | POA: Diagnosis not present

## 2022-07-31 DIAGNOSIS — Z87891 Personal history of nicotine dependence: Secondary | ICD-10-CM | POA: Diagnosis not present

## 2022-07-31 DIAGNOSIS — M109 Gout, unspecified: Secondary | ICD-10-CM | POA: Diagnosis not present

## 2022-07-31 DIAGNOSIS — E871 Hypo-osmolality and hyponatremia: Secondary | ICD-10-CM | POA: Diagnosis not present

## 2022-07-31 DIAGNOSIS — Z7984 Long term (current) use of oral hypoglycemic drugs: Secondary | ICD-10-CM | POA: Diagnosis not present

## 2022-08-03 ENCOUNTER — Telehealth: Payer: Self-pay

## 2022-08-03 NOTE — Telephone Encounter (Signed)
Spoke w/ PT at [facility]-verbal orders given.

## 2022-08-07 ENCOUNTER — Telehealth: Payer: Self-pay | Admitting: Internal Medicine

## 2022-08-07 DIAGNOSIS — E669 Obesity, unspecified: Secondary | ICD-10-CM | POA: Diagnosis not present

## 2022-08-07 DIAGNOSIS — Z87891 Personal history of nicotine dependence: Secondary | ICD-10-CM | POA: Diagnosis not present

## 2022-08-07 DIAGNOSIS — M109 Gout, unspecified: Secondary | ICD-10-CM | POA: Diagnosis not present

## 2022-08-07 DIAGNOSIS — Z6831 Body mass index (BMI) 31.0-31.9, adult: Secondary | ICD-10-CM | POA: Diagnosis not present

## 2022-08-07 DIAGNOSIS — I119 Hypertensive heart disease without heart failure: Secondary | ICD-10-CM | POA: Diagnosis not present

## 2022-08-07 DIAGNOSIS — E871 Hypo-osmolality and hyponatremia: Secondary | ICD-10-CM | POA: Diagnosis not present

## 2022-08-07 DIAGNOSIS — I7090 Unspecified atherosclerosis: Secondary | ICD-10-CM | POA: Diagnosis not present

## 2022-08-07 DIAGNOSIS — Z7902 Long term (current) use of antithrombotics/antiplatelets: Secondary | ICD-10-CM | POA: Diagnosis not present

## 2022-08-07 DIAGNOSIS — Z9181 History of falling: Secondary | ICD-10-CM | POA: Diagnosis not present

## 2022-08-07 DIAGNOSIS — I69351 Hemiplegia and hemiparesis following cerebral infarction affecting right dominant side: Secondary | ICD-10-CM | POA: Diagnosis not present

## 2022-08-07 DIAGNOSIS — E119 Type 2 diabetes mellitus without complications: Secondary | ICD-10-CM | POA: Diagnosis not present

## 2022-08-07 DIAGNOSIS — Z7984 Long term (current) use of oral hypoglycemic drugs: Secondary | ICD-10-CM | POA: Diagnosis not present

## 2022-08-07 DIAGNOSIS — I358 Other nonrheumatic aortic valve disorders: Secondary | ICD-10-CM | POA: Diagnosis not present

## 2022-08-07 NOTE — Telephone Encounter (Signed)
Caller/Agency: [facility] (michelle)  Callback Number: [phone] (secure) Requesting OT/PT/Skilled Nursing/Social Work/Speech Therapy: OT Frequency:  1x for 1w 2x for 3w 1x for 1w

## 2022-08-07 NOTE — Telephone Encounter (Signed)
Spoke w/ Marcelino Duster, OT verbal orders given.

## 2022-08-11 ENCOUNTER — Ambulatory Visit (INDEPENDENT_AMBULATORY_CARE_PROVIDER_SITE_OTHER): Payer: Medicare HMO | Admitting: Internal Medicine

## 2022-08-11 ENCOUNTER — Encounter: Payer: Self-pay | Admitting: Internal Medicine

## 2022-08-11 VITALS — BP 128/60 | HR 91 | Temp 97.9°F | Resp 18 | Ht 69.0 in

## 2022-08-11 DIAGNOSIS — I10 Essential (primary) hypertension: Secondary | ICD-10-CM

## 2022-08-11 DIAGNOSIS — Z7984 Long term (current) use of oral hypoglycemic drugs: Secondary | ICD-10-CM | POA: Diagnosis not present

## 2022-08-11 DIAGNOSIS — I6930 Unspecified sequelae of cerebral infarction: Secondary | ICD-10-CM

## 2022-08-11 DIAGNOSIS — E1169 Type 2 diabetes mellitus with other specified complication: Secondary | ICD-10-CM | POA: Diagnosis not present

## 2022-08-11 DIAGNOSIS — I639 Cerebral infarction, unspecified: Secondary | ICD-10-CM | POA: Diagnosis not present

## 2022-08-11 NOTE — Progress Notes (Unsigned)
Subjective:    Patient ID: Jimmy Valentine, male    DOB: 02/08/1950, 72 y.o.   MRN: [id number]  DOS:  08/11/2022 Type of visit - description: Hospital follow-up  Admitted to hospital and subsequently on 06/24/2022 to the inpatient rehab unit. 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  See CTA reports for details. Severe intracranial and cervical atherosclerosis stenosis. Carotid duplex with bilateral ICA 1 to 39% stenosis. Discharged on aspirin and Plavix x 3 months then aspirin alone   Physical exam upon discharge: Alert, oriented x 3. No aphasia or neglect.  Mild dysarthria. + R dense hemiplegia with flaccidity on hypotonia. At the rehab facility, a improving activity, tolerance, balance and postural control.  Most recent labs reviewed.  Review of Systems Here with his daughter and a good friend. Overall improving.  Denies headache, chest pain or difficulty breathing. No nausea vomiting.  No blood in the stools. Emotionally it is hard but he is doing okay.   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,had a MRI, was prescribed testosterone by endocrinology   Sleep apnea    does't use cpap   Sudden hearing loss, left 2017   idiopathic sensorineural hearing loss    Past Surgical History:  Procedure Laterality Date   TOTAL KNEE ARTHROPLASTY Right 2005   TOTAL KNEE ARTHROPLASTY  12/04/2011   LEFT-- TOTAL KNEE ARTHROPLASTY;  Surgeon: Eugenia Mcalpine, MD;  Location: [facility];  Service: Orthopedics;  Laterality: Left;    Current Outpatient Medications  Medication Instructions   acetaminophen (TYLENOL) 325-650 mg, Oral, Every 4 hours PRN   amLODipine (NORVASC) 10 mg, Oral, Daily   aspirin EC 81 mg, Oral, Daily, Swallow  whole.   atorvastatin (LIPITOR) 80 mg, Oral, Daily at bedtime   clopidogrel (PLAVIX) 75 mg, Oral, Daily   colchicine 0.6 mg, Oral, Daily   diclofenac Sodium (VOLTAREN) 2 g, Topical, 3 times daily   irbesartan (AVAPRO) 75 mg, Oral, Daily   magnesium oxide (MAG-OX) 400 mg, Oral, Daily   metFORMIN (GLUCOPHAGE) 500 mg, Oral, 2 times daily with meals   senna-docusate (SENOKOT-S) 8.6-50 MG tablet 2 tablets, Oral, 2 times daily       Objective:   Physical Exam BP 128/60   Pulse 91   Temp 97.9 F (36.6 C) (Oral)   Resp 18   Ht 5\' 9"  (1.753 m)   SpO2 96%   BMI 30.64 kg/m  General:   Well developed, NAD, BMI noted. HEENT:  Normocephalic . Face symmetric, atraumatic Lungs:  CTA B Normal respiratory effort, no intercostal retractions, no accessory muscle use. Heart: RRR,  no murmur.  Lower extremities: no pretibial edema bilaterally  Skin: Not pale. Not jaundice Neurologic:  alert & oriented X3.  Speech normal, gait not tested. Motor: Right-sided deficit, extremities-proximal more than distal, minimal at the face. Psych--  Cognition and judgment appear intact.  Cooperative with normal attention span and concentration.  Behavior appropriate. Slightly emotional at the end of the visit    Assessment     Assessment DM, Started metformin 04-2015 HTN Hyperlipidemia Hypogonadism Dx few years ago by endocrinology, secondary hypogonadism. Was rx testosterone, did not notice any subjective difference  while on meds. Currently on no HRT. DEXA 2014 and 2023: Normal  Erectile dysfunction Sleep apnea, mild, saw pulm 2014, CPAP was not mandatory, mostly to increase his QOL (see pulm note) DJD Gout Elevated LFTs x years, previous w/u (-) per pt ; hep B and C (-) 10-2014 Sees dermatology prn EKG 09/2017: RBBB, echo [facility].  PLAN: Stroke: Dx w/  a stroke recently, has residual right-sided paresis. Currently living at home, still needs a walker and the help of others to ambulate.    Still  needs help with   showers. Doing at home OT and PT for the next 3 to 4 weeks, after that will have outpatient PT OT. Overall he feels slightly stronger.  Emotionally has been very hard for him but he has a lot of support. He is here with his daughter Kennyth Arnold and a good friend Jonny Ruiz. Plan: A1c, CMP, CBC. Keep appointment with physical medicine iIn August and neurology in September. He will let me know if needs to be seen sooner. DM: On metformin, checking A1c, not checking CBGs at home.  We talk about the possibility of either a glucometer or CGM.  Will decide based on A1c. HTN: Ambulatory BPs 140/80 but his home cuff is reading higher than our cuff.  Rec to continue amlodipine, Avapro.  Monitor BPs with a new monitor, goals provided, labs. High cholesterol: Now on atorvastatin 80 mg, good compliance.  Labs on RTC. RTC 2 months.

## 2022-08-11 NOTE — Patient Instructions (Addendum)
It was good to see you today.  You can stop clopidogrel gel on August 23.  Please call anytime for questions of concerns.  Vaccines I recommend: Covid booster RSV vaccine Flu shot this fall  Please bring or send [place] a copy of your Healthcare Power of Attorney for your chart.    Continue checking your blood pressures at home, daily if possible. You could get a OMRON brand BP CUFF  Check the  blood pressure regularly BP GOAL is between 110/65 and  135/85. If it is consistently higher or lower, let me know    GO TO THE LAB : Get the blood work     GO TO THE FRONT DESK, PLEASE SCHEDULE YOUR APPOINTMENTS Come back for checkup in 2 months.

## 2022-08-12 DIAGNOSIS — I119 Hypertensive heart disease without heart failure: Secondary | ICD-10-CM | POA: Diagnosis not present

## 2022-08-12 DIAGNOSIS — E669 Obesity, unspecified: Secondary | ICD-10-CM | POA: Diagnosis not present

## 2022-08-12 DIAGNOSIS — I69351 Hemiplegia and hemiparesis following cerebral infarction affecting right dominant side: Secondary | ICD-10-CM | POA: Diagnosis not present

## 2022-08-12 DIAGNOSIS — Z87891 Personal history of nicotine dependence: Secondary | ICD-10-CM | POA: Diagnosis not present

## 2022-08-12 DIAGNOSIS — Z9181 History of falling: Secondary | ICD-10-CM | POA: Diagnosis not present

## 2022-08-12 DIAGNOSIS — E119 Type 2 diabetes mellitus without complications: Secondary | ICD-10-CM | POA: Diagnosis not present

## 2022-08-12 DIAGNOSIS — E871 Hypo-osmolality and hyponatremia: Secondary | ICD-10-CM | POA: Diagnosis not present

## 2022-08-12 DIAGNOSIS — Z6831 Body mass index (BMI) 31.0-31.9, adult: Secondary | ICD-10-CM | POA: Diagnosis not present

## 2022-08-12 DIAGNOSIS — I358 Other nonrheumatic aortic valve disorders: Secondary | ICD-10-CM | POA: Diagnosis not present

## 2022-08-12 DIAGNOSIS — M109 Gout, unspecified: Secondary | ICD-10-CM | POA: Diagnosis not present

## 2022-08-12 DIAGNOSIS — Z7902 Long term (current) use of antithrombotics/antiplatelets: Secondary | ICD-10-CM | POA: Diagnosis not present

## 2022-08-12 DIAGNOSIS — Z7984 Long term (current) use of oral hypoglycemic drugs: Secondary | ICD-10-CM | POA: Diagnosis not present

## 2022-08-12 DIAGNOSIS — I7090 Unspecified atherosclerosis: Secondary | ICD-10-CM | POA: Diagnosis not present

## 2022-08-12 LAB — CBC WITH DIFFERENTIAL/PLATELET
Basophils Absolute: 0.1 10*3/uL (ref 0.0–0.1)
Basophils Relative: 0.9 % (ref 0.0–3.0)
Eosinophils Absolute: 0.4 10*3/uL (ref 0.0–0.7)
Eosinophils Relative: 3.8 % (ref 0.0–5.0)
HCT: 43.8 % (ref 39.0–52.0)
Hemoglobin: 15.2 g/dL (ref 13.0–17.0)
Lymphocytes Relative: 16.9 % (ref 12.0–46.0)
Lymphs Abs: 1.6 10*3/uL (ref 0.7–4.0)
MCHC: 34.7 g/dL (ref 30.0–36.0)
MCV: 92.2 fl (ref 78.0–100.0)
Monocytes Absolute: 0.7 10*3/uL (ref 0.1–1.0)
Monocytes Relative: 7.9 % (ref 3.0–12.0)
Neutro Abs: 6.6 10*3/uL (ref 1.4–7.7)
Neutrophils Relative %: 70.5 % (ref 43.0–77.0)
Platelets: 332 10*3/uL (ref 150.0–400.0)
RBC: 4.76 Mil/uL (ref 4.22–5.81)
RDW: 13.3 % (ref 11.5–15.5)
WBC: 9.4 10*3/uL (ref 4.0–10.5)

## 2022-08-12 LAB — COMPREHENSIVE METABOLIC PANEL
ALT: 36 U/L (ref 0–53)
AST: 22 U/L (ref 0–37)
Albumin: 4.1 g/dL (ref 3.5–5.2)
Alkaline Phosphatase: 124 U/L — ABNORMAL HIGH (ref 39–117)
BUN: 17 mg/dL (ref 6–23)
CO2: 25 mEq/L (ref 19–32)
Calcium: 9.9 mg/dL (ref 8.4–10.5)
Chloride: 103 mEq/L (ref 96–112)
Creatinine, Ser: 0.86 mg/dL (ref 0.40–1.50)
GFR: 85.31 mL/min (ref >60.00)
Glucose, Bld: 124 mg/dL — ABNORMAL HIGH (ref 70–99)
Potassium: 4.1 mEq/L (ref 3.5–5.1)
Sodium: 138 mEq/L (ref 135–145)
Total Bilirubin: 0.8 mg/dL (ref 0.2–1.2)
Total Protein: 6.7 g/dL (ref 6.0–8.3)

## 2022-08-12 LAB — HEMOGLOBIN A1C: Hgb A1c MFr Bld: 6.5 % (ref 4.6–6.5)

## 2022-08-12 NOTE — Assessment & Plan Note (Signed)
Stroke: Dx w/  a stroke recently, has residual right-sided paresis. Currently living at home, still needs a walker and the help of others to ambulate.    Still needs help with   showers. Doing at home OT and PT for the next 3 to 4 weeks, after that will have outpatient PT OT. Overall he feels slightly stronger.  Emotionally has been very hard for him but he has a lot of support. He is here with his daughter Kennyth Arnold and a good friend Jonny Ruiz. Plan: A1c, CMP, CBC. Keep appointment with physical medicine iIn August and neurology in September. He will let me know if needs to be seen sooner. DM: On metformin, checking A1c, not checking CBGs at home.  We talk about the possibility of either a glucometer or CGM.  Will decide based on A1c. HTN: Ambulatory BPs 140/80 but his home cuff is reading higher than our cuff.  Rec to continue amlodipine, Avapro.  Monitor BPs with a new monitor, goals provided, labs. High cholesterol: Now on atorvastatin 80 mg, good compliance.  Labs on RTC. RTC 2 months.

## 2022-08-13 DIAGNOSIS — I69351 Hemiplegia and hemiparesis following cerebral infarction affecting right dominant side: Secondary | ICD-10-CM | POA: Diagnosis not present

## 2022-08-13 DIAGNOSIS — I119 Hypertensive heart disease without heart failure: Secondary | ICD-10-CM | POA: Diagnosis not present

## 2022-08-13 DIAGNOSIS — E119 Type 2 diabetes mellitus without complications: Secondary | ICD-10-CM | POA: Diagnosis not present

## 2022-08-13 DIAGNOSIS — I358 Other nonrheumatic aortic valve disorders: Secondary | ICD-10-CM | POA: Diagnosis not present

## 2022-08-13 DIAGNOSIS — Z9181 History of falling: Secondary | ICD-10-CM | POA: Diagnosis not present

## 2022-08-13 DIAGNOSIS — M109 Gout, unspecified: Secondary | ICD-10-CM | POA: Diagnosis not present

## 2022-08-13 DIAGNOSIS — E669 Obesity, unspecified: Secondary | ICD-10-CM | POA: Diagnosis not present

## 2022-08-13 DIAGNOSIS — Z87891 Personal history of nicotine dependence: Secondary | ICD-10-CM | POA: Diagnosis not present

## 2022-08-13 DIAGNOSIS — Z7984 Long term (current) use of oral hypoglycemic drugs: Secondary | ICD-10-CM | POA: Diagnosis not present

## 2022-08-13 DIAGNOSIS — Z7902 Long term (current) use of antithrombotics/antiplatelets: Secondary | ICD-10-CM | POA: Diagnosis not present

## 2022-08-13 DIAGNOSIS — E871 Hypo-osmolality and hyponatremia: Secondary | ICD-10-CM | POA: Diagnosis not present

## 2022-08-13 DIAGNOSIS — I7090 Unspecified atherosclerosis: Secondary | ICD-10-CM | POA: Diagnosis not present

## 2022-08-13 DIAGNOSIS — Z6831 Body mass index (BMI) 31.0-31.9, adult: Secondary | ICD-10-CM | POA: Diagnosis not present

## 2022-08-14 ENCOUNTER — Other Ambulatory Visit: Payer: Self-pay | Admitting: Internal Medicine

## 2022-08-14 DIAGNOSIS — Z6831 Body mass index (BMI) 31.0-31.9, adult: Secondary | ICD-10-CM | POA: Diagnosis not present

## 2022-08-14 DIAGNOSIS — Z7984 Long term (current) use of oral hypoglycemic drugs: Secondary | ICD-10-CM | POA: Diagnosis not present

## 2022-08-14 DIAGNOSIS — E119 Type 2 diabetes mellitus without complications: Secondary | ICD-10-CM | POA: Diagnosis not present

## 2022-08-14 DIAGNOSIS — I69351 Hemiplegia and hemiparesis following cerebral infarction affecting right dominant side: Secondary | ICD-10-CM | POA: Diagnosis not present

## 2022-08-14 DIAGNOSIS — M109 Gout, unspecified: Secondary | ICD-10-CM | POA: Diagnosis not present

## 2022-08-14 DIAGNOSIS — E669 Obesity, unspecified: Secondary | ICD-10-CM | POA: Diagnosis not present

## 2022-08-14 DIAGNOSIS — I358 Other nonrheumatic aortic valve disorders: Secondary | ICD-10-CM | POA: Diagnosis not present

## 2022-08-14 DIAGNOSIS — E871 Hypo-osmolality and hyponatremia: Secondary | ICD-10-CM | POA: Diagnosis not present

## 2022-08-14 DIAGNOSIS — Z7902 Long term (current) use of antithrombotics/antiplatelets: Secondary | ICD-10-CM | POA: Diagnosis not present

## 2022-08-14 DIAGNOSIS — I119 Hypertensive heart disease without heart failure: Secondary | ICD-10-CM | POA: Diagnosis not present

## 2022-08-14 DIAGNOSIS — Z9181 History of falling: Secondary | ICD-10-CM | POA: Diagnosis not present

## 2022-08-14 DIAGNOSIS — Z87891 Personal history of nicotine dependence: Secondary | ICD-10-CM | POA: Diagnosis not present

## 2022-08-14 DIAGNOSIS — I7090 Unspecified atherosclerosis: Secondary | ICD-10-CM | POA: Diagnosis not present

## 2022-08-17 ENCOUNTER — Other Ambulatory Visit (HOSPITAL_COMMUNITY): Payer: Self-pay

## 2022-08-17 DIAGNOSIS — Z7902 Long term (current) use of antithrombotics/antiplatelets: Secondary | ICD-10-CM | POA: Diagnosis not present

## 2022-08-17 DIAGNOSIS — Z6831 Body mass index (BMI) 31.0-31.9, adult: Secondary | ICD-10-CM | POA: Diagnosis not present

## 2022-08-17 DIAGNOSIS — I119 Hypertensive heart disease without heart failure: Secondary | ICD-10-CM | POA: Diagnosis not present

## 2022-08-17 DIAGNOSIS — Z9181 History of falling: Secondary | ICD-10-CM | POA: Diagnosis not present

## 2022-08-17 DIAGNOSIS — E871 Hypo-osmolality and hyponatremia: Secondary | ICD-10-CM | POA: Diagnosis not present

## 2022-08-17 DIAGNOSIS — I69351 Hemiplegia and hemiparesis following cerebral infarction affecting right dominant side: Secondary | ICD-10-CM | POA: Diagnosis not present

## 2022-08-17 DIAGNOSIS — Z7984 Long term (current) use of oral hypoglycemic drugs: Secondary | ICD-10-CM | POA: Diagnosis not present

## 2022-08-17 DIAGNOSIS — Z87891 Personal history of nicotine dependence: Secondary | ICD-10-CM | POA: Diagnosis not present

## 2022-08-17 DIAGNOSIS — I358 Other nonrheumatic aortic valve disorders: Secondary | ICD-10-CM | POA: Diagnosis not present

## 2022-08-17 DIAGNOSIS — E119 Type 2 diabetes mellitus without complications: Secondary | ICD-10-CM | POA: Diagnosis not present

## 2022-08-17 DIAGNOSIS — I7090 Unspecified atherosclerosis: Secondary | ICD-10-CM | POA: Diagnosis not present

## 2022-08-17 DIAGNOSIS — M109 Gout, unspecified: Secondary | ICD-10-CM | POA: Diagnosis not present

## 2022-08-17 DIAGNOSIS — E669 Obesity, unspecified: Secondary | ICD-10-CM | POA: Diagnosis not present

## 2022-08-18 DIAGNOSIS — Z6831 Body mass index (BMI) 31.0-31.9, adult: Secondary | ICD-10-CM | POA: Diagnosis not present

## 2022-08-18 DIAGNOSIS — Z7902 Long term (current) use of antithrombotics/antiplatelets: Secondary | ICD-10-CM | POA: Diagnosis not present

## 2022-08-18 DIAGNOSIS — I119 Hypertensive heart disease without heart failure: Secondary | ICD-10-CM | POA: Diagnosis not present

## 2022-08-18 DIAGNOSIS — E871 Hypo-osmolality and hyponatremia: Secondary | ICD-10-CM | POA: Diagnosis not present

## 2022-08-18 DIAGNOSIS — Z7984 Long term (current) use of oral hypoglycemic drugs: Secondary | ICD-10-CM | POA: Diagnosis not present

## 2022-08-18 DIAGNOSIS — I69351 Hemiplegia and hemiparesis following cerebral infarction affecting right dominant side: Secondary | ICD-10-CM | POA: Diagnosis not present

## 2022-08-18 DIAGNOSIS — I7090 Unspecified atherosclerosis: Secondary | ICD-10-CM | POA: Diagnosis not present

## 2022-08-18 DIAGNOSIS — E669 Obesity, unspecified: Secondary | ICD-10-CM | POA: Diagnosis not present

## 2022-08-18 DIAGNOSIS — I358 Other nonrheumatic aortic valve disorders: Secondary | ICD-10-CM | POA: Diagnosis not present

## 2022-08-18 DIAGNOSIS — M109 Gout, unspecified: Secondary | ICD-10-CM | POA: Diagnosis not present

## 2022-08-18 DIAGNOSIS — E119 Type 2 diabetes mellitus without complications: Secondary | ICD-10-CM | POA: Diagnosis not present

## 2022-08-18 DIAGNOSIS — Z87891 Personal history of nicotine dependence: Secondary | ICD-10-CM | POA: Diagnosis not present

## 2022-08-18 DIAGNOSIS — Z9181 History of falling: Secondary | ICD-10-CM | POA: Diagnosis not present

## 2022-08-19 DIAGNOSIS — Z7902 Long term (current) use of antithrombotics/antiplatelets: Secondary | ICD-10-CM | POA: Diagnosis not present

## 2022-08-19 DIAGNOSIS — M109 Gout, unspecified: Secondary | ICD-10-CM | POA: Diagnosis not present

## 2022-08-19 DIAGNOSIS — Z9181 History of falling: Secondary | ICD-10-CM | POA: Diagnosis not present

## 2022-08-19 DIAGNOSIS — I69351 Hemiplegia and hemiparesis following cerebral infarction affecting right dominant side: Secondary | ICD-10-CM | POA: Diagnosis not present

## 2022-08-19 DIAGNOSIS — E669 Obesity, unspecified: Secondary | ICD-10-CM | POA: Diagnosis not present

## 2022-08-19 DIAGNOSIS — Z87891 Personal history of nicotine dependence: Secondary | ICD-10-CM | POA: Diagnosis not present

## 2022-08-19 DIAGNOSIS — Z6831 Body mass index (BMI) 31.0-31.9, adult: Secondary | ICD-10-CM | POA: Diagnosis not present

## 2022-08-19 DIAGNOSIS — Z7984 Long term (current) use of oral hypoglycemic drugs: Secondary | ICD-10-CM | POA: Diagnosis not present

## 2022-08-19 DIAGNOSIS — E871 Hypo-osmolality and hyponatremia: Secondary | ICD-10-CM | POA: Diagnosis not present

## 2022-08-19 DIAGNOSIS — I358 Other nonrheumatic aortic valve disorders: Secondary | ICD-10-CM | POA: Diagnosis not present

## 2022-08-19 DIAGNOSIS — I7090 Unspecified atherosclerosis: Secondary | ICD-10-CM | POA: Diagnosis not present

## 2022-08-19 DIAGNOSIS — E119 Type 2 diabetes mellitus without complications: Secondary | ICD-10-CM | POA: Diagnosis not present

## 2022-08-19 DIAGNOSIS — I119 Hypertensive heart disease without heart failure: Secondary | ICD-10-CM | POA: Diagnosis not present

## 2022-08-20 ENCOUNTER — Telehealth: Payer: Self-pay

## 2022-08-20 NOTE — Telephone Encounter (Signed)
Plan of care signed and faxed back to [facility] at [phone]. Form sent for scanning.

## 2022-08-21 DIAGNOSIS — I119 Hypertensive heart disease without heart failure: Secondary | ICD-10-CM | POA: Diagnosis not present

## 2022-08-21 DIAGNOSIS — I69351 Hemiplegia and hemiparesis following cerebral infarction affecting right dominant side: Secondary | ICD-10-CM | POA: Diagnosis not present

## 2022-08-21 DIAGNOSIS — E119 Type 2 diabetes mellitus without complications: Secondary | ICD-10-CM | POA: Diagnosis not present

## 2022-08-21 DIAGNOSIS — E669 Obesity, unspecified: Secondary | ICD-10-CM | POA: Diagnosis not present

## 2022-08-21 DIAGNOSIS — M109 Gout, unspecified: Secondary | ICD-10-CM | POA: Diagnosis not present

## 2022-08-21 DIAGNOSIS — Z9181 History of falling: Secondary | ICD-10-CM | POA: Diagnosis not present

## 2022-08-21 DIAGNOSIS — I7090 Unspecified atherosclerosis: Secondary | ICD-10-CM | POA: Diagnosis not present

## 2022-08-21 DIAGNOSIS — I358 Other nonrheumatic aortic valve disorders: Secondary | ICD-10-CM | POA: Diagnosis not present

## 2022-08-21 DIAGNOSIS — E871 Hypo-osmolality and hyponatremia: Secondary | ICD-10-CM | POA: Diagnosis not present

## 2022-08-21 DIAGNOSIS — Z7902 Long term (current) use of antithrombotics/antiplatelets: Secondary | ICD-10-CM | POA: Diagnosis not present

## 2022-08-21 DIAGNOSIS — Z6831 Body mass index (BMI) 31.0-31.9, adult: Secondary | ICD-10-CM | POA: Diagnosis not present

## 2022-08-21 DIAGNOSIS — Z7984 Long term (current) use of oral hypoglycemic drugs: Secondary | ICD-10-CM | POA: Diagnosis not present

## 2022-08-21 DIAGNOSIS — Z87891 Personal history of nicotine dependence: Secondary | ICD-10-CM | POA: Diagnosis not present

## 2022-08-24 ENCOUNTER — Telehealth: Payer: Self-pay | Admitting: Internal Medicine

## 2022-08-24 MED ORDER — CLOPIDOGREL BISULFATE 75 MG PO TABS: 75.0000 mg | ORAL_TABLET | Freq: Every day | ORAL | 1 refills | Status: DC

## 2022-08-24 MED ORDER — METFORMIN HCL 500 MG PO TABS: 500.0000 mg | ORAL_TABLET | Freq: Two times a day (BID) | ORAL | 1 refills | Status: DC

## 2022-08-24 MED ORDER — ATORVASTATIN CALCIUM 80 MG PO TABS: 80.0000 mg | ORAL_TABLET | Freq: Every day | ORAL | 1 refills | Status: DC

## 2022-08-24 MED ORDER — AMLODIPINE BESYLATE 10 MG PO TABS: 10.0000 mg | ORAL_TABLET | Freq: Every day | ORAL | 1 refills | Status: DC

## 2022-08-24 MED ORDER — IRBESARTAN 75 MG PO TABS: 75.0000 mg | ORAL_TABLET | Freq: Every day | ORAL | 1 refills | Status: DC

## 2022-08-24 NOTE — Telephone Encounter (Signed)
amLODipine (NORVASC) 10 MG tablet  metFORMIN (GLUCOPHAGE) tablet 500 mg  atorvastatin (LIPITOR) 80 MG tablet  irbesartan (AVAPRO) 75 MG tablet  clopidogrel (PLAVIX) 75 MG tablet   Patient called needing med refills on the medications about. Please send to [facility] on [address].

## 2022-08-24 NOTE — Telephone Encounter (Signed)
Rxs sent

## 2022-08-24 NOTE — Addendum Note (Signed)
Addended byConrad Sylvanite D on: 08/24/2022 03:03 PM   Modules accepted: Orders

## 2022-08-26 DIAGNOSIS — I119 Hypertensive heart disease without heart failure: Secondary | ICD-10-CM | POA: Diagnosis not present

## 2022-08-26 DIAGNOSIS — I7090 Unspecified atherosclerosis: Secondary | ICD-10-CM | POA: Diagnosis not present

## 2022-08-26 DIAGNOSIS — Z9181 History of falling: Secondary | ICD-10-CM | POA: Diagnosis not present

## 2022-08-26 DIAGNOSIS — Z7984 Long term (current) use of oral hypoglycemic drugs: Secondary | ICD-10-CM | POA: Diagnosis not present

## 2022-08-26 DIAGNOSIS — Z7902 Long term (current) use of antithrombotics/antiplatelets: Secondary | ICD-10-CM | POA: Diagnosis not present

## 2022-08-26 DIAGNOSIS — Z6831 Body mass index (BMI) 31.0-31.9, adult: Secondary | ICD-10-CM | POA: Diagnosis not present

## 2022-08-26 DIAGNOSIS — I69351 Hemiplegia and hemiparesis following cerebral infarction affecting right dominant side: Secondary | ICD-10-CM | POA: Diagnosis not present

## 2022-08-26 DIAGNOSIS — M109 Gout, unspecified: Secondary | ICD-10-CM | POA: Diagnosis not present

## 2022-08-26 DIAGNOSIS — I358 Other nonrheumatic aortic valve disorders: Secondary | ICD-10-CM | POA: Diagnosis not present

## 2022-08-26 DIAGNOSIS — E119 Type 2 diabetes mellitus without complications: Secondary | ICD-10-CM | POA: Diagnosis not present

## 2022-08-26 DIAGNOSIS — E669 Obesity, unspecified: Secondary | ICD-10-CM | POA: Diagnosis not present

## 2022-08-26 DIAGNOSIS — Z87891 Personal history of nicotine dependence: Secondary | ICD-10-CM | POA: Diagnosis not present

## 2022-08-26 DIAGNOSIS — E871 Hypo-osmolality and hyponatremia: Secondary | ICD-10-CM | POA: Diagnosis not present

## 2022-08-28 ENCOUNTER — Telehealth: Payer: Self-pay | Admitting: Internal Medicine

## 2022-08-28 DIAGNOSIS — Z7984 Long term (current) use of oral hypoglycemic drugs: Secondary | ICD-10-CM | POA: Diagnosis not present

## 2022-08-28 DIAGNOSIS — E119 Type 2 diabetes mellitus without complications: Secondary | ICD-10-CM | POA: Diagnosis not present

## 2022-08-28 DIAGNOSIS — Z6831 Body mass index (BMI) 31.0-31.9, adult: Secondary | ICD-10-CM | POA: Diagnosis not present

## 2022-08-28 DIAGNOSIS — I358 Other nonrheumatic aortic valve disorders: Secondary | ICD-10-CM | POA: Diagnosis not present

## 2022-08-28 DIAGNOSIS — I7090 Unspecified atherosclerosis: Secondary | ICD-10-CM | POA: Diagnosis not present

## 2022-08-28 DIAGNOSIS — I69351 Hemiplegia and hemiparesis following cerebral infarction affecting right dominant side: Secondary | ICD-10-CM | POA: Diagnosis not present

## 2022-08-28 DIAGNOSIS — Z7902 Long term (current) use of antithrombotics/antiplatelets: Secondary | ICD-10-CM | POA: Diagnosis not present

## 2022-08-28 DIAGNOSIS — M109 Gout, unspecified: Secondary | ICD-10-CM | POA: Diagnosis not present

## 2022-08-28 DIAGNOSIS — I119 Hypertensive heart disease without heart failure: Secondary | ICD-10-CM | POA: Diagnosis not present

## 2022-08-28 DIAGNOSIS — E871 Hypo-osmolality and hyponatremia: Secondary | ICD-10-CM | POA: Diagnosis not present

## 2022-08-28 DIAGNOSIS — E669 Obesity, unspecified: Secondary | ICD-10-CM | POA: Diagnosis not present

## 2022-08-28 DIAGNOSIS — Z87891 Personal history of nicotine dependence: Secondary | ICD-10-CM | POA: Diagnosis not present

## 2022-08-28 DIAGNOSIS — Z9181 History of falling: Secondary | ICD-10-CM | POA: Diagnosis not present

## 2022-08-28 NOTE — Telephone Encounter (Signed)
Please advise 

## 2022-08-28 NOTE — Telephone Encounter (Signed)
Spoke w/ Kennyth Arnold, Pt's daughter- informed of PCP recommendations. Stacy verbalized understanding.

## 2022-08-28 NOTE — Telephone Encounter (Signed)
Potentially could be amlodipine (calcium channel blocker) but I am not recommending them to stop it. Could be related to being less mobile than before. Plan: - Use MiraLAX 17 g daily or every other day. - Drink plenty fluids

## 2022-08-28 NOTE — Telephone Encounter (Signed)
Patients daughter called and has a couple questions regarding the medications her dad is using. She wants to know which may be causing constipation. Please call

## 2022-08-31 ENCOUNTER — Telehealth: Payer: Self-pay | Admitting: Internal Medicine

## 2022-08-31 DIAGNOSIS — M109 Gout, unspecified: Secondary | ICD-10-CM | POA: Diagnosis not present

## 2022-08-31 DIAGNOSIS — I7090 Unspecified atherosclerosis: Secondary | ICD-10-CM | POA: Diagnosis not present

## 2022-08-31 DIAGNOSIS — I69351 Hemiplegia and hemiparesis following cerebral infarction affecting right dominant side: Secondary | ICD-10-CM | POA: Diagnosis not present

## 2022-08-31 DIAGNOSIS — Z9181 History of falling: Secondary | ICD-10-CM | POA: Diagnosis not present

## 2022-08-31 DIAGNOSIS — Z7984 Long term (current) use of oral hypoglycemic drugs: Secondary | ICD-10-CM | POA: Diagnosis not present

## 2022-08-31 DIAGNOSIS — E669 Obesity, unspecified: Secondary | ICD-10-CM | POA: Diagnosis not present

## 2022-08-31 DIAGNOSIS — E871 Hypo-osmolality and hyponatremia: Secondary | ICD-10-CM | POA: Diagnosis not present

## 2022-08-31 DIAGNOSIS — E119 Type 2 diabetes mellitus without complications: Secondary | ICD-10-CM | POA: Diagnosis not present

## 2022-08-31 DIAGNOSIS — Z6831 Body mass index (BMI) 31.0-31.9, adult: Secondary | ICD-10-CM | POA: Diagnosis not present

## 2022-08-31 DIAGNOSIS — I119 Hypertensive heart disease without heart failure: Secondary | ICD-10-CM | POA: Diagnosis not present

## 2022-08-31 DIAGNOSIS — I358 Other nonrheumatic aortic valve disorders: Secondary | ICD-10-CM | POA: Diagnosis not present

## 2022-08-31 DIAGNOSIS — Z7902 Long term (current) use of antithrombotics/antiplatelets: Secondary | ICD-10-CM | POA: Diagnosis not present

## 2022-08-31 DIAGNOSIS — Z87891 Personal history of nicotine dependence: Secondary | ICD-10-CM | POA: Diagnosis not present

## 2022-08-31 NOTE — Telephone Encounter (Signed)
Noted  

## 2022-08-31 NOTE — Telephone Encounter (Signed)
Jimmy Valentine from [facility] called to inform us that the pt will be evaluated next week to continue therapy.

## 2022-09-01 ENCOUNTER — Ambulatory Visit: Payer: Medicare HMO | Admitting: Internal Medicine

## 2022-09-01 ENCOUNTER — Telehealth: Payer: Self-pay | Admitting: Internal Medicine

## 2022-09-01 DIAGNOSIS — I358 Other nonrheumatic aortic valve disorders: Secondary | ICD-10-CM | POA: Diagnosis not present

## 2022-09-01 DIAGNOSIS — M109 Gout, unspecified: Secondary | ICD-10-CM | POA: Diagnosis not present

## 2022-09-01 DIAGNOSIS — I7090 Unspecified atherosclerosis: Secondary | ICD-10-CM | POA: Diagnosis not present

## 2022-09-01 DIAGNOSIS — E669 Obesity, unspecified: Secondary | ICD-10-CM | POA: Diagnosis not present

## 2022-09-01 DIAGNOSIS — Z9181 History of falling: Secondary | ICD-10-CM | POA: Diagnosis not present

## 2022-09-01 DIAGNOSIS — Z6831 Body mass index (BMI) 31.0-31.9, adult: Secondary | ICD-10-CM | POA: Diagnosis not present

## 2022-09-01 DIAGNOSIS — Z7984 Long term (current) use of oral hypoglycemic drugs: Secondary | ICD-10-CM | POA: Diagnosis not present

## 2022-09-01 DIAGNOSIS — E119 Type 2 diabetes mellitus without complications: Secondary | ICD-10-CM | POA: Diagnosis not present

## 2022-09-01 DIAGNOSIS — Z7902 Long term (current) use of antithrombotics/antiplatelets: Secondary | ICD-10-CM | POA: Diagnosis not present

## 2022-09-01 DIAGNOSIS — Z87891 Personal history of nicotine dependence: Secondary | ICD-10-CM | POA: Diagnosis not present

## 2022-09-01 DIAGNOSIS — I69351 Hemiplegia and hemiparesis following cerebral infarction affecting right dominant side: Secondary | ICD-10-CM | POA: Diagnosis not present

## 2022-09-01 DIAGNOSIS — I119 Hypertensive heart disease without heart failure: Secondary | ICD-10-CM | POA: Diagnosis not present

## 2022-09-01 DIAGNOSIS — E871 Hypo-osmolality and hyponatremia: Secondary | ICD-10-CM | POA: Diagnosis not present

## 2022-09-01 NOTE — Telephone Encounter (Signed)
Caller/Agency: michelle ([facility])  Callback Number: [phone] (secure) Requesting OT/PT/Skilled Nursing/Social Work/Speech Therapy: extending OT eft 8/5 Frequency: 2x for 2 weeks 1x for 1 week

## 2022-09-01 NOTE — Telephone Encounter (Signed)
LMOM for [facility] w/ verbal orders.

## 2022-09-02 ENCOUNTER — Telehealth: Payer: Self-pay

## 2022-09-02 NOTE — Telephone Encounter (Signed)
Physician orders (order number: [id number]) signed and faxed back to [facility] at [phone]. Form sent for scanning.

## 2022-09-04 ENCOUNTER — Encounter: Payer: Self-pay | Admitting: Physical Medicine & Rehabilitation

## 2022-09-04 ENCOUNTER — Encounter: Payer: Medicare HMO | Attending: Physical Medicine & Rehabilitation | Admitting: Physical Medicine & Rehabilitation

## 2022-09-04 ENCOUNTER — Telehealth: Payer: Self-pay

## 2022-09-04 VITALS — BP 124/77 | HR 87 | Ht 69.0 in | Wt 207.0 lb

## 2022-09-04 DIAGNOSIS — R269 Unspecified abnormalities of gait and mobility: Secondary | ICD-10-CM | POA: Diagnosis not present

## 2022-09-04 DIAGNOSIS — I69398 Other sequelae of cerebral infarction: Secondary | ICD-10-CM | POA: Diagnosis not present

## 2022-09-04 DIAGNOSIS — I69351 Hemiplegia and hemiparesis following cerebral infarction affecting right dominant side: Secondary | ICD-10-CM | POA: Insufficient documentation

## 2022-09-04 NOTE — Telephone Encounter (Signed)
Physician orders (order number: [id number]) signed and faxed back to [facility]- [phone]. Form sent for scanning.

## 2022-09-04 NOTE — Progress Notes (Signed)
Subjective:    Patient ID: KVION SHAPLEY, male    DOB: November 08, 1950, 72 y.o.   MRN: [id number]  72 y.o. male who presented to [facility] ED on 06/21/2022 with sudden onset of right-sided weakness, dysarthria and facial drooping. Code stroke initiated. CT head showed questionable hyperdense left MCA m1 and a chronic lacunar infarct of the left corona radiata/basal ganglia. He was administered tenecteplase. 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 Carotid duplex showed bilateral stenosis 1-39%. MRI showed large MCA territory basal ganglia infarct with petechial hemorrhage with no hemorrhagic transformation or mass effect. Echo showed EF 65-70%, trivial MVR. Hgb A1c is 6.7%. LDL 91. Started for DAPT x 3 months for intracranial atherosclerosis, then aspirin monotherapy, change statin to atorvastatin and continue at discharge.    On 5/19, patient's bedside RN noted worsening right-sided weakness from her initial assessment (initially slight pronator drift, now minimal antigravity strength noted) and neurology was notified while on the unit at 08:30 AM. Assessed by Dr. Viviann Spare.   Patient's right-sided weakness noted to be fluctuating but patient was still weaker on the right than bedside RN's initial assessment.  Stat CT head order placed and did not show new acute changes.   Admit date: 06/24/2022 Discharge date: 07/30/2022    HPI 72 year old male with history of left MCA distribution infarct with right hemiparesis.  His aphasia has been improving.  He still has significant right upper extremity and right lower extremity weakness.  Home health is coming out and only comes around 1 time per week.  He does have hired caregivers 24 hours a week.  They are able to transport him as well to appointments.  We discussed the potential for outpatient therapy once he finishes with home health. He wears an AFO.  He would like to take this off at times.  We discussed  that this is still important to prevent falls and that he could practice walking without it only with physical therapy.  Mod I UE dressing , still needs some assist with LE dresing  Needs assist with amb uses gait belt and walker  Fell x 1 time, no injury, therapy there at the time   Seen by PCP, instructions re: clopidigrel given Pain Inventory Average Pain 0 Pain Right Now 0 My pain is  no pain  BOWEL Number of stools per week: 2-3 Oral laxative use Yes  Type of laxative miralax  BLADDER Normal  Mobility use a walker how many minutes can you walk? 5-10 ability to climb steps?  yes do you drive?  no use a wheelchair needs help with transfers  Function retired I need assistance with the following:  dressing, bathing, toileting, meal prep, household duties, and shopping  Neuro/Psych weakness numbness trouble walking  Prior Studies Any changes since last visit?  no  Physicians involved in your care Primary care has seen PCP since discharge   Family History  Problem Relation Age of Onset   Hypertension Mother    Coronary artery disease Mother        M CABG at age 1s   Alzheimer's disease Mother    Diabetes Sister    Colon cancer Neg Hx    Prostate cancer Neg Hx    Stomach cancer Neg Hx    Rectal cancer Neg Hx    Social History   Socioeconomic History   Marital status: Widowed    Spouse name: married   Number of children: 0  Years of education: Not on file   Highest education level: Not on file  Occupational History   Occupation: fully retired-sales rep  Tobacco Use   Smoking status: Former    Current packs/day: 0.00    Average packs/day: 1 pack/day for 15.0 years (15.0 ttl pk-yrs)    Types: Cigarettes    Start date: 12/03/1980    Quit date: 12/04/1995    Years since quitting: 26.7   Smokeless tobacco: Never   Tobacco comments:    occ cigars   Substance and Sexual Activity   Alcohol use: Yes    Alcohol/week: 12.0 standard drinks of alcohol     Types: 12 Standard drinks or equivalent per week    Comment: social drinker   Drug use: No   Sexual activity: Not Currently  Other Topics Concern   Not on file  Social History Narrative   Lost first wife remotely.   Widow, lost 2nd wife 08-03-16   Lives alone, no biological children   2 step children ([place], [place])  from second wife , still very close to them      Social Determinants of Health   Financial Resource Strain: Low Risk  (10/01/2020)   Overall Financial Resource Strain (CARDIA)    Difficulty of Paying Living Expenses: Not hard at all  Food Insecurity: No Food Insecurity (06/22/2022)   Hunger Vital Sign    Worried About Running Out of Food in the Last Year: Never true    Ran Out of Food in the Last Year: Never true  Transportation Needs: No Transportation Needs (06/22/2022)   PRAPARE - Administrator, Civil Service (Medical): No    Lack of Transportation (Non-Medical): No  Physical Activity: Insufficiently Active (10/01/2020)   Exercise Vital Sign    Days of Exercise per Week: 3 days    Minutes of Exercise per Session: 30 min  Stress: No Stress Concern Present (10/01/2020)   [employer] of Occupational Health - Occupational Stress Questionnaire    Feeling of Stress : Not at all  Social Connections: Socially Isolated (10/01/2020)   Social Connection and Isolation Panel [NHANES]    Frequency of Communication with Friends and Family: More than three times a week    Frequency of Social Gatherings with Friends and Family: More than three times a week    Attends Religious Services: Never    Database administrator or Organizations: No    Attends Banker Meetings: Never    Marital Status: Widowed   Past Surgical History:  Procedure Laterality Date   TOTAL KNEE ARTHROPLASTY Right 2005   TOTAL KNEE ARTHROPLASTY  12/04/2011   LEFT-- TOTAL KNEE ARTHROPLASTY;  Surgeon: Eugenia Mcalpine, MD;  Location: [facility];  Service: Orthopedics;  Laterality: Left;    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,had a MRI, was prescribed testosterone by endocrinology   Sleep apnea    does't use cpap   Sudden hearing loss, left 2017   idiopathic sensorineural hearing loss   BP 124/77   Pulse 87   Ht 5\' 9"  (1.753 m)   Wt 207 lb (93.9 kg) Comment: last recorded  SpO2 96%   BMI 30.57 kg/m   Opioid Risk  Score:   Fall Risk Score:  `1  Depression screen PHQ 2/9     09/04/2022    1:32 PM 03/03/2022    8:54 AM 08/19/2021    8:01 AM 02/26/2021    8:55 AM 11/21/2020    8:17 AM 10/01/2020    9:47 AM 07/18/2020    8:32 AM  Depression screen PHQ 2/9  Decreased Interest 0 0 0 0 0 0 0  Down, Depressed, Hopeless 0 0 0 0 0 0 0  PHQ - 2 Score 0 0 0 0 0 0 0  Altered sleeping 0        Tired, decreased energy 0        Change in appetite 0        Feeling bad or failure about yourself  0        Trouble concentrating 0        Moving slowly or fidgety/restless 0        Suicidal thoughts 0        PHQ-9 Score 0           Review of Systems  Constitutional: Negative.   HENT: Negative.    Eyes: Negative.   Respiratory: Negative.    Cardiovascular: Negative.   Gastrointestinal:  Positive for constipation.  Endocrine: Negative.   Genitourinary: Negative.   Musculoskeletal:  Positive for gait problem.  Skin: Negative.   Allergic/Immunologic: Negative.   Neurological:  Positive for numbness.  Hematological:  Bruises/bleeds easily.       Plavix  Psychiatric/Behavioral: Negative.    All other systems reviewed and are negative.      Objective:   Physical Exam Vitals and nursing note reviewed.  Constitutional:      Appearance: He is obese.  HENT:     Head: Normocephalic and atraumatic.  Eyes:     Extraocular Movements: Extraocular  movements intact.     Conjunctiva/sclera: Conjunctivae normal.     Pupils: Pupils are equal, round, and reactive to light.  Musculoskeletal:     Comments: Mild edema dorsum of the right hand nontender to palpation no pain with wrist finger elbow or shoulder range of motion in the right upper extremity.  No pain with right lower extremity range of motion at the hip knee or ankle.   Skin:    General: Skin is warm and dry.  Neurological:     Mental Status: He is alert and oriented to person, place, and time.     Comments: No evidence vasomotor changes in the right upper extremity. Motor strength Right upper limb To minus tricep to minus bicep to minus finger flexors 0 finger extensors 0 wrist flexion and extension. Right lower limb to minus at the hip flexor 3 at the knee extensor 3 - at the ankle dorsiflexor plantar flexor as well as toe flexors and extensors Sensation intact to light touch in the right upper limb and right lower limb. Tone without evidence of significant spasticity.  MAS 1 at the elbow flexors wrist flexors and finger flexors as well as the hamstrings.  No evidence of clonus at the ankle.  Psychiatric:        Mood and Affect: Mood normal.        Behavior: Behavior normal.           Assessment & Plan:   1.  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  His strength has unfortunately not improved much since discharge from the hospital.  His speech has improved.  Functionally he is still making some progress with therapy. I do think he would do overall better with outpatient therapy but he would like to complete home health first. I do think he meets the criteria for handicap placard Would not advise driving at this time Patient will call when he would like orders for outpatient PT OT speech Discussed with patient as well as his family members who are in attendance at the clinic visit

## 2022-09-07 ENCOUNTER — Telehealth: Payer: Self-pay | Admitting: Internal Medicine

## 2022-09-07 DIAGNOSIS — Z9181 History of falling: Secondary | ICD-10-CM | POA: Diagnosis not present

## 2022-09-07 DIAGNOSIS — E871 Hypo-osmolality and hyponatremia: Secondary | ICD-10-CM | POA: Diagnosis not present

## 2022-09-07 DIAGNOSIS — I7090 Unspecified atherosclerosis: Secondary | ICD-10-CM | POA: Diagnosis not present

## 2022-09-07 DIAGNOSIS — Z7902 Long term (current) use of antithrombotics/antiplatelets: Secondary | ICD-10-CM | POA: Diagnosis not present

## 2022-09-07 DIAGNOSIS — E669 Obesity, unspecified: Secondary | ICD-10-CM | POA: Diagnosis not present

## 2022-09-07 DIAGNOSIS — Z7984 Long term (current) use of oral hypoglycemic drugs: Secondary | ICD-10-CM | POA: Diagnosis not present

## 2022-09-07 DIAGNOSIS — I69351 Hemiplegia and hemiparesis following cerebral infarction affecting right dominant side: Secondary | ICD-10-CM | POA: Diagnosis not present

## 2022-09-07 DIAGNOSIS — Z6831 Body mass index (BMI) 31.0-31.9, adult: Secondary | ICD-10-CM | POA: Diagnosis not present

## 2022-09-07 DIAGNOSIS — M109 Gout, unspecified: Secondary | ICD-10-CM | POA: Diagnosis not present

## 2022-09-07 DIAGNOSIS — Z87891 Personal history of nicotine dependence: Secondary | ICD-10-CM | POA: Diagnosis not present

## 2022-09-07 DIAGNOSIS — I358 Other nonrheumatic aortic valve disorders: Secondary | ICD-10-CM | POA: Diagnosis not present

## 2022-09-07 DIAGNOSIS — I119 Hypertensive heart disease without heart failure: Secondary | ICD-10-CM | POA: Diagnosis not present

## 2022-09-07 DIAGNOSIS — E119 Type 2 diabetes mellitus without complications: Secondary | ICD-10-CM | POA: Diagnosis not present

## 2022-09-07 NOTE — Telephone Encounter (Signed)
Caller/Agency: Brett Canales ([facility])  Callback Number: [phone]  Requesting OT/PT/Skilled Nursing/Social Work/Speech Therapy: PT Frequency: 1x for 1 week 2x for 2 weeks

## 2022-09-07 NOTE — Telephone Encounter (Signed)
Left message giving verbal orders for PT

## 2022-09-09 DIAGNOSIS — E669 Obesity, unspecified: Secondary | ICD-10-CM | POA: Diagnosis not present

## 2022-09-09 DIAGNOSIS — I358 Other nonrheumatic aortic valve disorders: Secondary | ICD-10-CM | POA: Diagnosis not present

## 2022-09-09 DIAGNOSIS — I119 Hypertensive heart disease without heart failure: Secondary | ICD-10-CM | POA: Diagnosis not present

## 2022-09-09 DIAGNOSIS — M109 Gout, unspecified: Secondary | ICD-10-CM | POA: Diagnosis not present

## 2022-09-09 DIAGNOSIS — I69351 Hemiplegia and hemiparesis following cerebral infarction affecting right dominant side: Secondary | ICD-10-CM | POA: Diagnosis not present

## 2022-09-09 DIAGNOSIS — Z7902 Long term (current) use of antithrombotics/antiplatelets: Secondary | ICD-10-CM | POA: Diagnosis not present

## 2022-09-09 DIAGNOSIS — Z9181 History of falling: Secondary | ICD-10-CM | POA: Diagnosis not present

## 2022-09-09 DIAGNOSIS — E871 Hypo-osmolality and hyponatremia: Secondary | ICD-10-CM | POA: Diagnosis not present

## 2022-09-09 DIAGNOSIS — Z7984 Long term (current) use of oral hypoglycemic drugs: Secondary | ICD-10-CM | POA: Diagnosis not present

## 2022-09-09 DIAGNOSIS — Z6831 Body mass index (BMI) 31.0-31.9, adult: Secondary | ICD-10-CM | POA: Diagnosis not present

## 2022-09-09 DIAGNOSIS — Z87891 Personal history of nicotine dependence: Secondary | ICD-10-CM | POA: Diagnosis not present

## 2022-09-09 DIAGNOSIS — I7090 Unspecified atherosclerosis: Secondary | ICD-10-CM | POA: Diagnosis not present

## 2022-09-09 DIAGNOSIS — E119 Type 2 diabetes mellitus without complications: Secondary | ICD-10-CM | POA: Diagnosis not present

## 2022-09-14 DIAGNOSIS — I7090 Unspecified atherosclerosis: Secondary | ICD-10-CM | POA: Diagnosis not present

## 2022-09-14 DIAGNOSIS — M109 Gout, unspecified: Secondary | ICD-10-CM | POA: Diagnosis not present

## 2022-09-14 DIAGNOSIS — Z87891 Personal history of nicotine dependence: Secondary | ICD-10-CM | POA: Diagnosis not present

## 2022-09-14 DIAGNOSIS — Z6831 Body mass index (BMI) 31.0-31.9, adult: Secondary | ICD-10-CM | POA: Diagnosis not present

## 2022-09-14 DIAGNOSIS — I69351 Hemiplegia and hemiparesis following cerebral infarction affecting right dominant side: Secondary | ICD-10-CM | POA: Diagnosis not present

## 2022-09-14 DIAGNOSIS — Z7984 Long term (current) use of oral hypoglycemic drugs: Secondary | ICD-10-CM | POA: Diagnosis not present

## 2022-09-14 DIAGNOSIS — E669 Obesity, unspecified: Secondary | ICD-10-CM | POA: Diagnosis not present

## 2022-09-14 DIAGNOSIS — Z9181 History of falling: Secondary | ICD-10-CM | POA: Diagnosis not present

## 2022-09-14 DIAGNOSIS — I119 Hypertensive heart disease without heart failure: Secondary | ICD-10-CM | POA: Diagnosis not present

## 2022-09-14 DIAGNOSIS — I358 Other nonrheumatic aortic valve disorders: Secondary | ICD-10-CM | POA: Diagnosis not present

## 2022-09-14 DIAGNOSIS — Z7902 Long term (current) use of antithrombotics/antiplatelets: Secondary | ICD-10-CM | POA: Diagnosis not present

## 2022-09-14 DIAGNOSIS — E871 Hypo-osmolality and hyponatremia: Secondary | ICD-10-CM | POA: Diagnosis not present

## 2022-09-14 DIAGNOSIS — E119 Type 2 diabetes mellitus without complications: Secondary | ICD-10-CM | POA: Diagnosis not present

## 2022-09-16 DIAGNOSIS — M109 Gout, unspecified: Secondary | ICD-10-CM | POA: Diagnosis not present

## 2022-09-16 DIAGNOSIS — Z87891 Personal history of nicotine dependence: Secondary | ICD-10-CM | POA: Diagnosis not present

## 2022-09-16 DIAGNOSIS — Z7902 Long term (current) use of antithrombotics/antiplatelets: Secondary | ICD-10-CM | POA: Diagnosis not present

## 2022-09-16 DIAGNOSIS — E119 Type 2 diabetes mellitus without complications: Secondary | ICD-10-CM | POA: Diagnosis not present

## 2022-09-16 DIAGNOSIS — E871 Hypo-osmolality and hyponatremia: Secondary | ICD-10-CM | POA: Diagnosis not present

## 2022-09-16 DIAGNOSIS — E669 Obesity, unspecified: Secondary | ICD-10-CM | POA: Diagnosis not present

## 2022-09-16 DIAGNOSIS — Z7984 Long term (current) use of oral hypoglycemic drugs: Secondary | ICD-10-CM | POA: Diagnosis not present

## 2022-09-16 DIAGNOSIS — Z6831 Body mass index (BMI) 31.0-31.9, adult: Secondary | ICD-10-CM | POA: Diagnosis not present

## 2022-09-16 DIAGNOSIS — I358 Other nonrheumatic aortic valve disorders: Secondary | ICD-10-CM | POA: Diagnosis not present

## 2022-09-16 DIAGNOSIS — I7090 Unspecified atherosclerosis: Secondary | ICD-10-CM | POA: Diagnosis not present

## 2022-09-16 DIAGNOSIS — I119 Hypertensive heart disease without heart failure: Secondary | ICD-10-CM | POA: Diagnosis not present

## 2022-09-16 DIAGNOSIS — Z9181 History of falling: Secondary | ICD-10-CM | POA: Diagnosis not present

## 2022-09-16 DIAGNOSIS — I69351 Hemiplegia and hemiparesis following cerebral infarction affecting right dominant side: Secondary | ICD-10-CM | POA: Diagnosis not present

## 2022-09-18 DIAGNOSIS — I358 Other nonrheumatic aortic valve disorders: Secondary | ICD-10-CM | POA: Diagnosis not present

## 2022-09-18 DIAGNOSIS — M109 Gout, unspecified: Secondary | ICD-10-CM | POA: Diagnosis not present

## 2022-09-18 DIAGNOSIS — Z7984 Long term (current) use of oral hypoglycemic drugs: Secondary | ICD-10-CM | POA: Diagnosis not present

## 2022-09-18 DIAGNOSIS — Z9181 History of falling: Secondary | ICD-10-CM | POA: Diagnosis not present

## 2022-09-18 DIAGNOSIS — Z7902 Long term (current) use of antithrombotics/antiplatelets: Secondary | ICD-10-CM | POA: Diagnosis not present

## 2022-09-18 DIAGNOSIS — I7090 Unspecified atherosclerosis: Secondary | ICD-10-CM | POA: Diagnosis not present

## 2022-09-18 DIAGNOSIS — I119 Hypertensive heart disease without heart failure: Secondary | ICD-10-CM | POA: Diagnosis not present

## 2022-09-18 DIAGNOSIS — Z87891 Personal history of nicotine dependence: Secondary | ICD-10-CM | POA: Diagnosis not present

## 2022-09-18 DIAGNOSIS — E119 Type 2 diabetes mellitus without complications: Secondary | ICD-10-CM | POA: Diagnosis not present

## 2022-09-18 DIAGNOSIS — E871 Hypo-osmolality and hyponatremia: Secondary | ICD-10-CM | POA: Diagnosis not present

## 2022-09-18 DIAGNOSIS — I69351 Hemiplegia and hemiparesis following cerebral infarction affecting right dominant side: Secondary | ICD-10-CM | POA: Diagnosis not present

## 2022-09-18 DIAGNOSIS — E669 Obesity, unspecified: Secondary | ICD-10-CM | POA: Diagnosis not present

## 2022-09-18 DIAGNOSIS — Z6831 Body mass index (BMI) 31.0-31.9, adult: Secondary | ICD-10-CM | POA: Diagnosis not present

## 2022-09-22 DIAGNOSIS — Z7984 Long term (current) use of oral hypoglycemic drugs: Secondary | ICD-10-CM | POA: Diagnosis not present

## 2022-09-22 DIAGNOSIS — I69351 Hemiplegia and hemiparesis following cerebral infarction affecting right dominant side: Secondary | ICD-10-CM | POA: Diagnosis not present

## 2022-09-22 DIAGNOSIS — Z7902 Long term (current) use of antithrombotics/antiplatelets: Secondary | ICD-10-CM | POA: Diagnosis not present

## 2022-09-22 DIAGNOSIS — Z87891 Personal history of nicotine dependence: Secondary | ICD-10-CM | POA: Diagnosis not present

## 2022-09-22 DIAGNOSIS — M109 Gout, unspecified: Secondary | ICD-10-CM | POA: Diagnosis not present

## 2022-09-22 DIAGNOSIS — Z6831 Body mass index (BMI) 31.0-31.9, adult: Secondary | ICD-10-CM | POA: Diagnosis not present

## 2022-09-22 DIAGNOSIS — E119 Type 2 diabetes mellitus without complications: Secondary | ICD-10-CM | POA: Diagnosis not present

## 2022-09-22 DIAGNOSIS — I358 Other nonrheumatic aortic valve disorders: Secondary | ICD-10-CM | POA: Diagnosis not present

## 2022-09-22 DIAGNOSIS — E871 Hypo-osmolality and hyponatremia: Secondary | ICD-10-CM | POA: Diagnosis not present

## 2022-09-22 DIAGNOSIS — E669 Obesity, unspecified: Secondary | ICD-10-CM | POA: Diagnosis not present

## 2022-09-22 DIAGNOSIS — I119 Hypertensive heart disease without heart failure: Secondary | ICD-10-CM | POA: Diagnosis not present

## 2022-09-22 DIAGNOSIS — I7090 Unspecified atherosclerosis: Secondary | ICD-10-CM | POA: Diagnosis not present

## 2022-09-22 DIAGNOSIS — Z9181 History of falling: Secondary | ICD-10-CM | POA: Diagnosis not present

## 2022-09-23 ENCOUNTER — Telehealth: Payer: Self-pay | Admitting: Internal Medicine

## 2022-09-23 ENCOUNTER — Encounter: Payer: Self-pay | Admitting: Internal Medicine

## 2022-09-23 DIAGNOSIS — Z7902 Long term (current) use of antithrombotics/antiplatelets: Secondary | ICD-10-CM | POA: Diagnosis not present

## 2022-09-23 DIAGNOSIS — Z7984 Long term (current) use of oral hypoglycemic drugs: Secondary | ICD-10-CM | POA: Diagnosis not present

## 2022-09-23 DIAGNOSIS — Z6831 Body mass index (BMI) 31.0-31.9, adult: Secondary | ICD-10-CM | POA: Diagnosis not present

## 2022-09-23 DIAGNOSIS — M109 Gout, unspecified: Secondary | ICD-10-CM | POA: Diagnosis not present

## 2022-09-23 DIAGNOSIS — E669 Obesity, unspecified: Secondary | ICD-10-CM | POA: Diagnosis not present

## 2022-09-23 DIAGNOSIS — I69351 Hemiplegia and hemiparesis following cerebral infarction affecting right dominant side: Secondary | ICD-10-CM | POA: Diagnosis not present

## 2022-09-23 DIAGNOSIS — I358 Other nonrheumatic aortic valve disorders: Secondary | ICD-10-CM | POA: Diagnosis not present

## 2022-09-23 DIAGNOSIS — Z9181 History of falling: Secondary | ICD-10-CM | POA: Diagnosis not present

## 2022-09-23 DIAGNOSIS — E871 Hypo-osmolality and hyponatremia: Secondary | ICD-10-CM | POA: Diagnosis not present

## 2022-09-23 DIAGNOSIS — I119 Hypertensive heart disease without heart failure: Secondary | ICD-10-CM | POA: Diagnosis not present

## 2022-09-23 DIAGNOSIS — Z87891 Personal history of nicotine dependence: Secondary | ICD-10-CM | POA: Diagnosis not present

## 2022-09-23 DIAGNOSIS — E119 Type 2 diabetes mellitus without complications: Secondary | ICD-10-CM | POA: Diagnosis not present

## 2022-09-23 DIAGNOSIS — I7090 Unspecified atherosclerosis: Secondary | ICD-10-CM | POA: Diagnosis not present

## 2022-09-23 NOTE — Telephone Encounter (Signed)
PT referral placed.

## 2022-09-23 NOTE — Telephone Encounter (Signed)
Brett Canales from [facility] states pt is being discharged from [facility] and wanting to go to out patient therapy. They would like the referral sent to [facility] outpatient therapy.

## 2022-09-24 DIAGNOSIS — Z87891 Personal history of nicotine dependence: Secondary | ICD-10-CM | POA: Diagnosis not present

## 2022-09-24 DIAGNOSIS — Z6831 Body mass index (BMI) 31.0-31.9, adult: Secondary | ICD-10-CM | POA: Diagnosis not present

## 2022-09-24 DIAGNOSIS — I7090 Unspecified atherosclerosis: Secondary | ICD-10-CM | POA: Diagnosis not present

## 2022-09-24 DIAGNOSIS — E871 Hypo-osmolality and hyponatremia: Secondary | ICD-10-CM | POA: Diagnosis not present

## 2022-09-24 DIAGNOSIS — Z7902 Long term (current) use of antithrombotics/antiplatelets: Secondary | ICD-10-CM | POA: Diagnosis not present

## 2022-09-24 DIAGNOSIS — I358 Other nonrheumatic aortic valve disorders: Secondary | ICD-10-CM | POA: Diagnosis not present

## 2022-09-24 DIAGNOSIS — E669 Obesity, unspecified: Secondary | ICD-10-CM | POA: Diagnosis not present

## 2022-09-24 DIAGNOSIS — I119 Hypertensive heart disease without heart failure: Secondary | ICD-10-CM | POA: Diagnosis not present

## 2022-09-24 DIAGNOSIS — I69351 Hemiplegia and hemiparesis following cerebral infarction affecting right dominant side: Secondary | ICD-10-CM | POA: Diagnosis not present

## 2022-09-24 DIAGNOSIS — Z7984 Long term (current) use of oral hypoglycemic drugs: Secondary | ICD-10-CM | POA: Diagnosis not present

## 2022-09-24 DIAGNOSIS — M109 Gout, unspecified: Secondary | ICD-10-CM | POA: Diagnosis not present

## 2022-09-24 DIAGNOSIS — Z9181 History of falling: Secondary | ICD-10-CM | POA: Diagnosis not present

## 2022-09-24 DIAGNOSIS — E119 Type 2 diabetes mellitus without complications: Secondary | ICD-10-CM | POA: Diagnosis not present

## 2022-09-28 ENCOUNTER — Telehealth: Payer: Self-pay | Admitting: Internal Medicine

## 2022-09-28 NOTE — Addendum Note (Signed)
Addended byConrad Warsaw D on: 09/28/2022 01:09 PM   Modules accepted: Orders

## 2022-09-28 NOTE — Telephone Encounter (Signed)
Plavix started on 06/22/2022, to be taken for 3 months. Okay to stop Plavix. Continue aspirin. Please let the pharmacy know no further refills needed.

## 2022-09-28 NOTE — Telephone Encounter (Signed)
Pt called & requested to speak with Dr. Drue Novel or Vicente Males because he mentioned he was advised from Dr. Drue Novel to stop taking the medication clopidogrel (PLAVIX) 75 MG tablet after the 23rd, however, his pharmacy refilled the medication for 90 days. He is unsure if e should continue taking the medication. Please call and advise pt.

## 2022-09-28 NOTE — Telephone Encounter (Signed)
Please advise 

## 2022-09-28 NOTE — Telephone Encounter (Signed)
LMOM informing Pt's daughter Jimmy Valentine to have Pt d/c Plavix and continue aspirin. Instructed to call if questions/concerns. Med list updated.

## 2022-09-30 ENCOUNTER — Telehealth: Payer: Self-pay | Admitting: Physical Therapy

## 2022-09-30 ENCOUNTER — Ambulatory Visit: Payer: Medicare HMO | Attending: Internal Medicine | Admitting: Physical Therapy

## 2022-09-30 DIAGNOSIS — M6281 Muscle weakness (generalized): Secondary | ICD-10-CM | POA: Diagnosis not present

## 2022-09-30 DIAGNOSIS — I69351 Hemiplegia and hemiparesis following cerebral infarction affecting right dominant side: Secondary | ICD-10-CM | POA: Insufficient documentation

## 2022-09-30 DIAGNOSIS — R2689 Other abnormalities of gait and mobility: Secondary | ICD-10-CM | POA: Insufficient documentation

## 2022-09-30 DIAGNOSIS — R2681 Unsteadiness on feet: Secondary | ICD-10-CM | POA: Diagnosis not present

## 2022-09-30 NOTE — Telephone Encounter (Signed)
Dr. Drue Novel,  Derry Skill was evaluated by PT on 09/30/22.  The patient would benefit from an OT evaluation for R hemiparesis.    If you agree, please place an order in [facility] workque in [facility] or fax the order to [phone].  Thank you, Josephine Igo, PT, DPT [facility] [address] [place], [place]  [address] Phone:  [phone] Fax:  [phone]

## 2022-09-30 NOTE — Therapy (Signed)
OUTPATIENT PHYSICAL THERAPY NEURO EVALUATION   Patient Name: UNICE ANGIER MRN: [id number] DOB:28-Apr-1950, 72 y.o., male Today's Date: 10/01/2022   PCP: Wanda Plump, MD REFERRING PROVIDER: Wanda Plump, MD  END OF SESSION:  PT End of Session - 09/30/22 1538     Visit Number 1    Number of Visits 17   Plus eval   Date for PT Re-Evaluation 12/09/22    Authorization Type [employer]    PT Start Time 1535    PT Stop Time 1616    PT Time Calculation (min) 41 min    Equipment Utilized During Treatment Gait belt    Activity Tolerance Patient tolerated treatment well    Behavior During Therapy WFL for tasks assessed/performed             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,had a MRI, was prescribed testosterone by endocrinology   Sleep apnea    does't use cpap   Sudden hearing loss, left 2017   idiopathic sensorineural hearing loss   Past Surgical History:  Procedure Laterality Date   TOTAL KNEE ARTHROPLASTY Right 2005   TOTAL KNEE ARTHROPLASTY  12/04/2011   LEFT-- TOTAL KNEE ARTHROPLASTY;  Surgeon: Eugenia Mcalpine, MD;  Location: [facility];  Service: Orthopedics;  Laterality: Left;   Patient Active Problem List   Diagnosis Date Noted   Hemiparesis affecting right side as late effect of cerebrovascular accident (CVA) (HCC) 09/04/2022   Gait disturbance, post-stroke 09/04/2022   Hyponatremia 07/18/2022   Aphasia due to acute cerebrovascular accident (CVA) (HCC) 07/15/2022   Acute ischemic left MCA stroke (HCC) 06/24/2022   Stroke (cerebrum) (HCC) 06/21/2022   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  10/18/2014   Elevated LFTs 04/13/2014   Diabetes (HCC) 09/03/2011   Gout 09/03/2011   OSA (obstructive sleep apnea) 04/22/2011   Annual physical exam 12/04/2010   Hypertension    Hyperlipidemia    Hypogonadism male    Erectile dysfunction     ONSET DATE: 09/23/2022 (referral)   REFERRING DIAG: W09.811 (ICD-10-CM) - Hemiparesis affecting right side as late effect of cerebrovascular accident (CVA) (HCC)  THERAPY DIAG:  Hemiplegia and hemiparesis following cerebral infarction affecting right dominant side (HCC)  Muscle weakness (generalized)  Unsteadiness on feet  Other abnormalities of gait and mobility  Rationale for Evaluation and Treatment: Rehabilitation  SUBJECTIVE:  SUBJECTIVE STATEMENT: Pt ambulated into clinic w/RW and R hand orthosis. Not wearing AFO, states he stopped wearing it a few weeks ago. Cannot get his AFO on well. Has a caregiver during the day and stays alone at night. Works on his HEP from [employer] during the day. Is able to go to the bathroom on his own now. Uses the RW for walking and is in the [facility] for most of the day.   Pt accompanied by:  Caregiver, [name]   PERTINENT HISTORY: L MCA CVA in May 2024   PAIN:  Are you having pain? No  PRECAUTIONS: Fall  WEIGHT BEARING RESTRICTIONS: No  FALLS: Has patient fallen in last 6 months? Yes. Number of falls 1  LIVING ENVIRONMENT: Lives with: lives alone Lives in: House/apartment Stairs: Yes: External: 3 steps; on right going up, on left going up, and can reach both Has following equipment at home: Walker - 2 wheeled, Wheelchair (manual), shower chair, bed side commode, and AFO  PLOF: Independent  PATIENT GOALS: "Get back to where I can walk by myself without the walker and without assistance"   OBJECTIVE:   DIAGNOSTIC  FINDINGS: MRI of brain on 06/22/22  IMPRESSION: 1. Positive for Left MCA territory lacunar type infarct tracking from the left corona radiata to the lentiform. Petechial hemorrhage (Heidelberg classification 1b: HI2). No malignant hemorrhagic transformation. No significant mass effect.   2. No other acute intracranial abnormality. 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  CT angio of head/neck on 06/21/22 IMPRESSION: 1. 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 The left MCA bi/trifurcation and left M2 branches remain patent.   2. Superimposed Severe intracranial and cervical ICA Atherosclerosis resulting in: - distal Right Vertebral Artery (V4) OCCLUSION. - distal Left Vertebral Artery Severe (V4) stenosis. - Right ICA origin RADIOGRAPHIC STRING SIGN stenosis due to calcified plaque. - Left ICA origin stenosis due to complex plaque estimated 65-70%. - Severe Left ICA supraclinoid stenosis due to calcified plaque.  COGNITION: Overall cognitive status: Within functional limits for tasks assessed   SENSATION: Pt denies numbness/tingling in RLE/RUE   COORDINATION: Heel to shin test: WNL on LLE, unable to perform hip flexion on RLE but able to achieve cross-legged position    MUSCLE TONE: Mild clonus in R foot that fatigues after 3s   POSTURE: rounded shoulders and forward head   LOWER EXTREMITY MMT:  Tested in seated position   MMT Right Eval Left Eval  Hip flexion 2- 4+  Hip extension    Hip abduction 1 4+  Hip adduction 2 4+  Hip internal rotation    Hip external rotation    Knee flexion 3 4+  Knee extension 2 4+  Ankle dorsiflexion 4- 4+  Ankle plantarflexion    Ankle inversion    Ankle eversion    (Blank rows = not tested)  BED  MOBILITY:  Has a rail but requires assistance (up to max) to roll and perform sit <>supine   TRANSFERS: Assistive device utilized: Environmental consultant - 2 wheeled  Sit to stand: SBA Stand to sit: SBA Pt kickstands RLE and leans to L side to perform    GAIT: Gait pattern: step to pattern, decreased step length- Right, decreased stride length, decreased hip/knee flexion- Right, decreased ankle dorsiflexion- Right, Left hip hike, lateral lean- Left,  and poor foot clearance- Right Distance walked: Various clinic distances  Assistive device utilized: Walker - 2 wheeled Level of assistance: CGA and Min A Comments: Pt not wearing AFO this date. Noted pt dragging RLE on ground, especially w/fatigue, and would lose balance anteriorly requiring min A to stabilize. Pt also vaults to progress RLE and frequently tips RW over on L side.   FUNCTIONAL TESTS:   [facility] PT Assessment - 09/30/22 1602       Transfers   Five time sit to stand comments  11.53s   LUE support, no weight on RLE     Ambulation/Gait   Gait velocity 32.8' over 65.87s = 0.49 ft/s w/RW   min A            TODAY'S TREATMENT:       Next session                                                                                                                       PATIENT EDUCATION: Education details: POC, eval findings, bringing AFO and shoe w/toe cap to next session  Person educated: Patient and Horticulturist, commercial Education method: Medical illustrator Education comprehension: verbalized understanding  HOME EXERCISE PROGRAM: To be established   GOALS: Goals reviewed with patient? Yes  SHORT TERM GOALS: Target date: 10/28/2022   Pt will perform initial HEP w/min A from caregiver for improved strength, balance, transfers and gait.  Baseline: to be established  Goal status: INITIAL  2.  Pt will improve gait velocity to at least 0.7 ft/s w/RW and CGA for improved gait efficiency and independence   Baseline: 0.49 ft/s w/RW  and min A Goal status: INITIAL  3.  Pt will be able to don and doff R hand orthotic on RW w/CGA for improved independence and weight shift to R side  Baseline: max A  Goal status: INITIAL  4.  or to be assessed and STG/LTG updated  Baseline:  Goal status: INITIAL  5.  Berg to be assessed and LTG written  Baseline:  Goal status: INITIAL  6.  Pt will perform supine > sit EOB w/min A or use of rail for improved independence and functional strength Baseline: mod-max A Goal status: INITIAL  LONG TERM GOALS: Target date: 11/25/2022   Pt will be independent with final HEP for improved strength, balance, transfers and gait.  Baseline:  Goal status: INITIAL  2.  Pt will improve gait velocity to at least 1.0 ft/s w/LRAD and SBA for improved gait efficiency and independence   Baseline:  Goal status: INITIAL  3.  2MWT/6MWT goal  Baseline:  Goal status: INITIAL  4.  Berg goal  Baseline:  Goal status: INITIAL  5.  Pt will perform bed mobility at CGA or SBA level for improved independence at home  Baseline: mod-max A Goal status: INITIAL   ASSESSMENT:  CLINICAL IMPRESSION: Patient is a 72 year old male referred to Neuro OPPT for CVA.   Pt's PMH  is significant for: DM2, arthritis, HTN, sleep apnea.  The following deficits were present during the exam: decreased strength, impaired body mechanics, decreased activity tolerance, clonus and impaired functional use of RUE. Based on gait speed and falls history, pt is an incr risk for falls. Pt would benefit from skilled PT to address these impairments and functional limitations to maximize functional mobility independence.    OBJECTIVE IMPAIRMENTS: Abnormal gait, decreased activity tolerance, decreased balance, decreased coordination, decreased mobility, difficulty walking, decreased strength, impaired tone, impaired UE functional use, improper body mechanics, and pain.   ACTIVITY LIMITATIONS: carrying, lifting, standing,  squatting, stairs, transfers, bed mobility, bathing, toileting, dressing, self feeding, reach over head, hygiene/grooming, locomotion level, and caring for others  PARTICIPATION LIMITATIONS: meal prep, cleaning, laundry, medication management, personal finances, interpersonal relationship, driving, shopping, community activity, and yard work  PERSONAL FACTORS: Fitness, Past/current experiences, Transportation, and 1 comorbidity: CVA  are also affecting patient's functional outcome.   REHAB POTENTIAL: Good  CLINICAL DECISION MAKING: Evolving/moderate complexity  EVALUATION COMPLEXITY: Moderate  PLAN:  PT FREQUENCY: 2x/week  PT DURATION: 8 weeks (POC written for 10 weeks due to delay in scheduling)   PLANNED INTERVENTIONS: Therapeutic exercises, Therapeutic activity, Neuromuscular re-education, Balance training, Gait training, Patient/Family education, Self Care, Joint mobilization, Stair training, Orthotic/Fit training, DME instructions, Aquatic Therapy, Dry Needling, Electrical stimulation, Manual therapy, and Re-evaluation  PLAN FOR NEXT SESSION: Berg and 2MWT/6MWT and update goals. Did he bring AFO? If so, assess gait with and without AFO. RLE NMR, weight shift to R side, work on bed mobility and donning/doffing RUE from hand orthotic    Ramses Klecka E Danaye Sobh, PT, DPT 10/01/2022, 7:52 AM

## 2022-10-01 ENCOUNTER — Other Ambulatory Visit: Payer: Self-pay

## 2022-10-01 NOTE — Telephone Encounter (Signed)
Referral placed.

## 2022-10-01 NOTE — Patient Outreach (Signed)
Telephone outreach to patient to obtain mRS was successfully completed. MRS= 3  Jimmy Valentine [facility] Care Management Assistant [phone]  

## 2022-10-01 NOTE — Telephone Encounter (Signed)
I agree. Jimmy Valentine please place the order

## 2022-10-12 ENCOUNTER — Ambulatory Visit (INDEPENDENT_AMBULATORY_CARE_PROVIDER_SITE_OTHER): Payer: Medicare HMO | Admitting: Internal Medicine

## 2022-10-12 ENCOUNTER — Encounter: Payer: Self-pay | Admitting: Internal Medicine

## 2022-10-12 VITALS — BP 136/80 | HR 80 | Temp 98.1°F | Resp 18 | Ht 69.0 in | Wt 195.0 lb

## 2022-10-12 DIAGNOSIS — R7989 Other specified abnormal findings of blood chemistry: Secondary | ICD-10-CM

## 2022-10-12 DIAGNOSIS — E785 Hyperlipidemia, unspecified: Secondary | ICD-10-CM | POA: Diagnosis not present

## 2022-10-12 DIAGNOSIS — Z7984 Long term (current) use of oral hypoglycemic drugs: Secondary | ICD-10-CM

## 2022-10-12 DIAGNOSIS — I6930 Unspecified sequelae of cerebral infarction: Secondary | ICD-10-CM

## 2022-10-12 DIAGNOSIS — E1169 Type 2 diabetes mellitus with other specified complication: Secondary | ICD-10-CM | POA: Diagnosis not present

## 2022-10-12 DIAGNOSIS — Z23 Encounter for immunization: Secondary | ICD-10-CM

## 2022-10-12 DIAGNOSIS — I10 Essential (primary) hypertension: Secondary | ICD-10-CM | POA: Diagnosis not present

## 2022-10-12 NOTE — Patient Instructions (Addendum)
Go back on aspirin 81 mg 1 every day.  You have a list of the recommended medications   Vaccines I recommend: Covid booster- new this fall Flu shot this fall RSV vaccine  Check the  blood pressure regularly Blood pressure goal:  between 110/65 and  135/85. If it is consistently higher or lower, let me know      GO TO THE FRONT DESK, PLEASE SCHEDULE YOUR APPOINTMENTS Come back for checkup in 4 months  Per our records you are due for your diabetic eye exam. Please contact your eye doctor to schedule an appointment. Please have them send copies of your office visit notes to [place]. Our fax number is [phone]. If you need a referral to an eye doctor please let us know.

## 2022-10-12 NOTE — Assessment & Plan Note (Signed)
Stroke: L MCA --Since the last office visit, things are stable, Saw Dr. Doroteo Bradford, 09/04/2022, visit w/ neuro pending for next week --Soon to start home PT/OT twice a week -- Not taking aspirin, apparently a misunderstanding, recommend to start taking aspirin 81 mg every day. HTN: Slightly elevated upon arrival, recheck 136/80.  Reports WNL readings at home.  Continue amlodipine, Avapro.  Last BMP satisfactory. On magnesium supplements.  Check level. DM: Recheck A1c, on metformin. High cholesterol: Prior to the stroke was on simvastatin, now on atorvastatin.  Recheck FLP and LFTs. Vaccine advice provided.  Patient elected to do the flu shot today RTC 4 months

## 2022-10-12 NOTE — Progress Notes (Signed)
Subjective:    Patient ID: Jimmy Valentine, male    DOB: May 19, 1950, 72 y.o.   MRN: [id number]  DOS:  10/12/2022 Type of visit - description: Follow-up, here with her caregiver Helmut Muster)  Compared to last office visit is feeling a lot better. Denies chest pain or difficulty breathing. No nausea vomiting. Ambulatory BPs at home normal. Not taking aspirin, " I think you told me to stop".   Review of Systems See above   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,had a MRI, was prescribed testosterone by endocrinology   Sleep apnea    does't use cpap   Sudden hearing loss, left 2017   idiopathic sensorineural hearing loss    Past Surgical History:  Procedure Laterality Date   TOTAL KNEE ARTHROPLASTY Right 2005   TOTAL KNEE ARTHROPLASTY  12/04/2011   LEFT-- TOTAL KNEE ARTHROPLASTY;  Surgeon: Eugenia Mcalpine, MD;  Location: [facility];  Service: Orthopedics;  Laterality: Left;    Current Outpatient Medications  Medication Instructions   acetaminophen (TYLENOL) 325-650 mg, Oral, Every 4 hours PRN   amLODipine (NORVASC) 10 mg, Oral, Daily   aspirin EC 81 mg, Oral, Daily, Swallow whole.   atorvastatin (LIPITOR) 80 mg, Oral, Daily at bedtime   colchicine 0.6 mg, Oral, Daily   diclofenac Sodium (VOLTAREN) 2 g, Topical, 3 times daily   irbesartan (AVAPRO) 75 mg, Oral, Daily   magnesium oxide (MAG-OX) 400 mg, Oral, Daily   metFORMIN (GLUCOPHAGE) 500 mg, Oral, 2 times daily with meals   senna-docusate (SENOKOT-S) 8.6-50 MG tablet 2 tablets, Oral, 2 times daily       Objective:   Physical Exam BP (!) 151/84   Pulse 80   Temp 98.1 F (36.7 C) (Oral)   Resp 18   Ht 5\' 9"  (1.753 m)   Wt 195 lb (88.5 kg) Comment: approximate  SpO2 98%   BMI 28.80 kg/m  General:    Well developed, NAD, BMI noted. HEENT:  Normocephalic . Face symmetric, atraumatic Lungs:  CTA B Normal respiratory effort, no intercostal retractions, no accessory muscle use. Heart: RRR,  no murmur.  Lower extremities: no pretibial edema bilaterally  Skin: Not pale. Not jaundice Neurologic:  alert & oriented X3.  Speech normal, gait not  tested.  R paresis noted . Psych--  Cognition and judgment appear intact.  Cooperative with normal attention span and concentration.  Behavior appropriate. No anxious or depressed appearing.      Assessment    Assessment DM, Started metformin 04-2015 HTN Hyperlipidemia Hypogonadism Dx few years ago by endocrinology, secondary hypogonadism. Was rx testosterone, did not notice any subjective difference while on meds. Currently on no HRT. DEXA 2014 and 2023: Normal  Erectile dysfunction Sleep apnea, mild, saw pulm 2014, CPAP was not mandatory, mostly to increase his QOL (see pulm note) DJD Gout Elevated LFTs x years, previous w/u (-) per pt ; hep B and C (-) 10-2014 Sees dermatology prn EKG 09/2017: RBBB, echo LVH. Stroke (06/2022).  L MCA distribution, R hemiparesis.   PLAN: Stroke: L MCA --Since the last office visit, things are stable, Saw Dr. Doroteo Bradford, 09/04/2022, visit w/ neuro pending for next week --Soon to start home PT/OT twice a week --  Not taking aspirin, apparently a misunderstanding, recommend to start taking aspirin 81 mg every day. HTN: Slightly elevated upon arrival, recheck 136/80.  Reports WNL readings at home.  Continue amlodipine, Avapro.  Last BMP satisfactory. On magnesium supplements.  Check level. DM: Recheck A1c, on metformin. High cholesterol: Prior to the stroke was on simvastatin, now on atorvastatin.  Recheck FLP and LFTs. Vaccine advice provided.  Patient elected to do the flu shot today RTC 4 months

## 2022-10-13 ENCOUNTER — Encounter: Payer: Medicare HMO | Admitting: *Deleted

## 2022-10-13 LAB — LIPID PANEL
Cholesterol: 131 mg/dL (ref 0–200)
HDL: 40.6 mg/dL (ref >39.00)
LDL Cholesterol: 57 mg/dL (ref 0–99)
NonHDL: 90.05
Total CHOL/HDL Ratio: 3
Triglycerides: 166 mg/dL — ABNORMAL HIGH (ref 0.0–149.0)
VLDL: 33.2 mg/dL (ref 0.0–40.0)

## 2022-10-13 LAB — HEMOGLOBIN A1C: Hgb A1c MFr Bld: 6.3 % (ref 4.6–6.5)

## 2022-10-13 LAB — MAGNESIUM: Magnesium: 2 mg/dL (ref 1.5–2.5)

## 2022-10-13 LAB — HEPATIC FUNCTION PANEL
ALT: 24 U/L (ref 0–53)
AST: 16 U/L (ref 0–37)
Albumin: 4.3 g/dL (ref 3.5–5.2)
Alkaline Phosphatase: 133 U/L — ABNORMAL HIGH (ref 39–117)
Bilirubin, Direct: 0.1 mg/dL (ref 0.0–0.3)
Total Bilirubin: 0.6 mg/dL (ref 0.2–1.2)
Total Protein: 7.3 g/dL (ref 6.0–8.3)

## 2022-10-13 NOTE — Progress Notes (Signed)
Called pt for AWV but pt wanted to reschedule.  He will call the office at a later time.This encounter was created in error - please disregard.

## 2022-10-14 ENCOUNTER — Ambulatory Visit: Payer: Medicare HMO

## 2022-10-15 ENCOUNTER — Ambulatory Visit: Payer: Medicare HMO | Attending: Internal Medicine | Admitting: Occupational Therapy

## 2022-10-15 DIAGNOSIS — M6281 Muscle weakness (generalized): Secondary | ICD-10-CM | POA: Diagnosis not present

## 2022-10-15 DIAGNOSIS — R2689 Other abnormalities of gait and mobility: Secondary | ICD-10-CM | POA: Diagnosis not present

## 2022-10-15 DIAGNOSIS — I69351 Hemiplegia and hemiparesis following cerebral infarction affecting right dominant side: Secondary | ICD-10-CM

## 2022-10-15 DIAGNOSIS — R278 Other lack of coordination: Secondary | ICD-10-CM | POA: Diagnosis not present

## 2022-10-15 DIAGNOSIS — I63512 Cerebral infarction due to unspecified occlusion or stenosis of left middle cerebral artery: Secondary | ICD-10-CM | POA: Insufficient documentation

## 2022-10-15 DIAGNOSIS — R2681 Unsteadiness on feet: Secondary | ICD-10-CM | POA: Insufficient documentation

## 2022-10-15 DIAGNOSIS — S43111D Subluxation of right acromioclavicular joint, subsequent encounter: Secondary | ICD-10-CM | POA: Diagnosis not present

## 2022-10-15 NOTE — Therapy (Signed)
OUTPATIENT OCCUPATIONAL THERAPY NEURO EVALUATION  Patient Name: Jimmy Valentine MRN: [id number] DOB:23-Apr-1950, 72 y.o., male Today's Date: 10/15/2022  PCP: Wanda Plump, MD REFERRING PROVIDER: Wanda Plump, MD  END OF SESSION:  OT End of Session - 10/15/22 1011     Visit Number 1    Number of Visits 16   + evaluaiton   Date for OT Re-Evaluation 12/11/22    Authorization Type [employer] MC 2024 Met VL: [place] Auth Not Reqd    OT Start Time 1015    OT Stop Time 1100    OT Time Calculation (min) 45 min    Equipment Utilized During Treatment Testing Material    Activity Tolerance Patient tolerated treatment well    Behavior During Therapy WFL for tasks assessed/performed             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,had a MRI, was prescribed testosterone by endocrinology   Sleep apnea    does't use cpap   Sudden hearing loss, left 2017   idiopathic sensorineural hearing loss   Past Surgical History:  Procedure Laterality Date   TOTAL KNEE ARTHROPLASTY Right 2005   TOTAL KNEE ARTHROPLASTY  12/04/2011   LEFT-- TOTAL KNEE ARTHROPLASTY;  Surgeon: Eugenia Mcalpine, MD;  Location: [facility];  Service: Orthopedics;  Laterality: Left;   Patient Active Problem List   Diagnosis Date Noted   Hemiparesis affecting right side as late effect of cerebrovascular accident (CVA) (HCC) 09/04/2022   Gait disturbance, post-stroke 09/04/2022   Hyponatremia 07/18/2022   Aphasia due to acute cerebrovascular accident (CVA) (HCC) 07/15/2022   Acute ischemic left MCA stroke (HCC) 06/24/2022   Stroke (cerebrum) (HCC) 06/21/2022   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 MRI showed large MCA territory basal ganglia infarct with petechial hemorrhage with no hemorrhagic transformation or mass effect.   Inpatient Rehab: 06/24/2022 - 07/30/2022 Patient discharged at [facility] Assist level.  Patient's care partners able to provide the necessary physical assistance at discharge.   Recommendation:  Patient will benefit from ongoing  skilled OT services in home health setting to continue to advance functional skills in the area of BADL, iADL, and Reduce care partner burden. Equipment: Shower chair, commode, RW, R RW hand splint, R resting hand splint, LH sponge, Dycem   Home Health therapies 07/24 - 08/24     PRECAUTIONS: Fall  WEIGHT BEARING RESTRICTIONS: No  PAIN:  Are you having pain? No  FALLS: Has patient fallen in last 6 months? Yes. Number of falls 1x - fell on his bottom about a  month ago - got the walker stuck on the doorframe  LIVING ENVIRONMENT: Lives with: lives alone and Caregiver 3-4 days/week x 12 hours, daughter, son and brother-in-law assist on other days Lives in: House/apartment Stairs: Yes: External: 3-5 steps; can reach both Has following equipment at home: Environmental consultant - 2 wheeled, Wheelchair (manual), Shower bench, bed side commode, Grab bars, and urinal  PLOF: Independent - still driving, played golf, retired Immunologist with lots traveling (driving)  PATIENT GOALS: To get my right side back working again  OBJECTIVE:   HAND DOMINANCE: Right (affected side)  ADLs: Overall ADLs: Min to mod assistance Transfers/ambulation related to ADLs: Supervision Eating: Gets help to cut food as needed, having to use L hand Grooming: Used regular razor yesterday for the first time UB Dressing: Needs help to get R arm in sleeve LB Dressing: Can pull pants up most of the time Toileting: Most of the time he is able to perform on his own; uses urinal at bedside at night Bathing: "50/50" with caregiver Tub Shower transfers: Gets help to get his leg into the tub Equipment: Transfer tub bench, Grab bars, bed side commode, and Long handled sponge  IADLs: Shopping: caregiver and family (daughter) Light housekeeping: Caregiver Meal Prep: Psychologist, counselling mobility: RW with hand orthosis & dycem + strap for R hand Medication management: Assistance from caregivers Financial management: Assistance from family Handwriting: unable with R UE; not assessed with L UE  MOBILITY STATUS: Needs Assist: Help to get his R UE/hand on the handle of his walker and Hx of falls  POSTURE COMMENTS:  rounded shoulders and forward head Sitting balance:  WFL in arm chair at table  ACTIVITY TOLERANCE: Activity tolerance: Limited due to R hemiplegia  FUNCTIONAL OUTCOME MEASURES: PSFS - 1.0  UPPER EXTREMITY ROM:    Active ROM Right eval Left eval  Shoulder flexion  [facility]   Shoulder abduction    Shoulder adduction    Shoulder extension min   Shoulder internal rotation    Shoulder external rotation    Elbow flexion min   Elbow extension    Wrist flexion min   Wrist extension    Wrist ulnar deviation    Wrist radial deviation    Wrist pronation    Wrist supination    (Blank rows = not tested)  UPPER EXTREMITY MMT:     MMT Right eval Left eval  Shoulder flexion  4/5  Shoulder abduction    Shoulder adduction    Shoulder extension    Shoulder internal rotation    Shoulder external rotation    Middle trapezius    Lower trapezius    Elbow flexion    Elbow extension    Wrist flexion    Wrist extension    Wrist ulnar deviation    Wrist radial deviation    Wrist pronation    Wrist supination    (Blank rows = not tested)  HAND FUNCTION: Grip strength: Right: 3.9, 3.7, 5.2 lbs; Left:  34.8, 36.5, 37.9 lbs Average: Right 4.3 lbs Left 36.4 lbs  COORDINATION: Unable to perform R UE coordinaiton tasks,   SENSATION: WFL  EDEMA: Slight RUE  MUSCLE TONE: RUE: Hypotonic and Flaccid  COGNITION: Overall cognitive status: Within functional limits for tasks assessed and h/o more severe aphasia and does self-report need to think about what he is trying to say etc  VISION: Subjective report: Eye Dr ~ 3 weeks Baseline vision: Wears glasses for reading only Visual history: cataracts  VISION ASSESSMENT: TBA  Patient is not having difficulty with activities due to vision.  PERCEPTION: Not tested  PRAXIS: Not tested  OBSERVATIONS: Pt ambulated into clinic w/RW and R hand orthosis. Patient was wearing his AFO which he stated he stopped wearing after he finished home health therapy.   TODAY'S TREATMENT:                                                                                                                              DATE: NA   PATIENT EDUCATION: Education details: OT role and POC considerations Person educated: Patient and Caregiver  /Aide Education method: Explanation Education comprehension: verbalized understanding and needs further education  HOME EXERCISE PROGRAM: TBD   GOALS: Goals reviewed with patient? Yes  SHORT TERM GOALS: Target date: 11/13/22  Patient will demonstrate updated RUE HEP with 25% verbal cues or less for proper execution.  Baseline: New to outpatient OT.  Self reported difficulty with motivation at home Goal status: INITIAL  2.  Patient will demonstrate at least 8+ lbs RUE grip strength as needed to stabilize container on tabletop/hold large handled object.  Baseline: Right 4.3 lbs Goal status: INITIAL  3.  Patient will be able to don T-shirt without physical assistance x 5 days in a row. Baseline: Caregiver assistance to get R UE in sleeve Goal status: INITIAL  4.  Patient will improve R UE AROM for shoulder elevation and elbow flexion to lift arm/hand to walker.  Baseline: Caregiver assist or uses LUE.  Trace elbow/shoulder movement Goal status: INITIAL  5.  Patient will be assisted to don/doff appropriate splints/positioning devices to minimize subluxation and minimize risk of RUE/hand contracture. Baseline: None place Goal status: INITIAL   LONG TERM GOALS: Target date: 12/11/22  Patient will demonstrate updated RUE HEP with visual schedule and images for proper execution and frequency. Baseline: New to outpatient OT.  Self reported difficulty with motivation at home. Goal status: INITIAL  2.  Patient will demonstrate at least 10 lbs RUE grip strength as needed to hold objects for manipulation by LUE. Baseline: Right 4.3 lbs Goal status: INITIAL  3.  Patient will be able to get to EOB with no physical assistance to toilet with BSC as needed. Baseline: Assistance to get to EOB from caregivers. Goal status: INITIAL  4.  Patient will be able to prepare simple food items with appropriate AE (small crock pot, microwave, toaster oven etc) Baseline: Caregiver/family perform  meal  prep  Goal status: INITIAL  5.  Patient will report at least two-point increase in average PSFS score or at least three-point increase in a single activity score indicating functionally significant improvement given minimum detectable change. Baseline: 1.0 total score (See above for individual activity scores)  Goal status: INITIAL   ASSESSMENT:  CLINICAL IMPRESSION: Patient is a 72 y.o. male who was seen today for occupational therapy evaluation for Acute ischemic left MCA stroke. Hx includes    Hypertension, gout, DM2. Patient currently presents significantly below PLOF.  Baseline level of function was independent and he is  demonstrating severe functional deficits and impairments due to R hemiplegia. Pt would benefit from skilled OT services in the outpatient setting to work on impairments to help pt return to higher independence, safety and as modified independent as possible.      PERFORMANCE DEFICITS: in functional skills including ADLs, coordination, dexterity, proprioception, sensation, tone, ROM, strength, flexibility, Fine motor control, Gross motor control, mobility, balance, endurance, decreased knowledge of use of DME, skin integrity, and UE functional use, cognitive skills including energy/drive, safety awareness, and sequencing, and psychosocial skills including coping strategies, environmental adaptation, and routines and behaviors.   IMPAIRMENTS: are limiting patient from ADLs, IADLs, rest and sleep, leisure, and social participation.   CO-MORBIDITIES: may have co-morbidities  that affects occupational performance. Patient will benefit from skilled OT to address above impairments and improve overall function.  MODIFICATION OR ASSISTANCE TO COMPLETE EVALUATION: No modification of tasks or assist necessary to complete an evaluation.  OT OCCUPATIONAL PROFILE AND HISTORY: Problem focused assessment: Including review of records relating to presenting problem.  CLINICAL  DECISION MAKING: LOW - limited treatment options, no task modification necessary  REHAB POTENTIAL: Good  EVALUATION COMPLEXITY: Low    PLAN:  OT FREQUENCY: 1-2x/week  OT DURATION: 8 weeks  PLANNED INTERVENTIONS: self care/ADL training, therapeutic exercise, therapeutic activity, neuromuscular re-education, manual therapy, passive range of motion, balance training, functional mobility training, aquatic therapy, splinting, electrical stimulation, patient/family education, cognitive remediation/compensation, visual/perceptual remediation/compensation, energy conservation, coping strategies training, and DME and/or AE instructions  RECOMMENDED OTHER SERVICES: PT evaluation already completed.  CONSULTED AND AGREED WITH PLAN OF CARE: Patient and family member/caregiver  PLAN FOR NEXT SESSION:  Review previous HEPs for continued appropriateness and need for updating E-stim protocols for R UE AAROM Splinting needs (?) ie) hemi sling/hand splint  Consideration for Neuro Hand Glove options Geraldo Pitter option Myomo v. Darrol Poke, OT 10/15/2022, 2:34 PM

## 2022-10-20 ENCOUNTER — Ambulatory Visit: Payer: Medicare HMO | Admitting: Physical Therapy

## 2022-10-20 ENCOUNTER — Ambulatory Visit: Payer: Medicare HMO | Admitting: Occupational Therapy

## 2022-10-20 VITALS — BP 156/87 | HR 88

## 2022-10-20 DIAGNOSIS — R2689 Other abnormalities of gait and mobility: Secondary | ICD-10-CM

## 2022-10-20 DIAGNOSIS — R2681 Unsteadiness on feet: Secondary | ICD-10-CM

## 2022-10-20 DIAGNOSIS — M6281 Muscle weakness (generalized): Secondary | ICD-10-CM

## 2022-10-20 DIAGNOSIS — I69351 Hemiplegia and hemiparesis following cerebral infarction affecting right dominant side: Secondary | ICD-10-CM

## 2022-10-20 DIAGNOSIS — I63512 Cerebral infarction due to unspecified occlusion or stenosis of left middle cerebral artery: Secondary | ICD-10-CM | POA: Diagnosis not present

## 2022-10-20 DIAGNOSIS — S43111D Subluxation of right acromioclavicular joint, subsequent encounter: Secondary | ICD-10-CM | POA: Diagnosis not present

## 2022-10-20 DIAGNOSIS — R278 Other lack of coordination: Secondary | ICD-10-CM | POA: Diagnosis not present

## 2022-10-20 NOTE — Therapy (Unsigned)
OUTPATIENT OCCUPATIONAL THERAPY NEURO TREATMENT  Patient Name: Jimmy Valentine MRN: [id number] DOB:[birth year], 72 y.o., male Today's Date: 10/20/2022  PCP: Wanda Plump, MD REFERRING PROVIDER: Wanda Plump, MD  END OF SESSION:  OT End of Session - 10/20/22 1539     Visit Number 2    Number of Visits 16   + evaluaiton   Date for OT Re-Evaluation 12/11/22    Authorization Type [employer] MC 2024 Met VL: [place] Auth Not Reqd    OT Start Time 1540    OT Stop Time 1625    OT Time Calculation (min) 45 min    Activity Tolerance Patient tolerated treatment well    Behavior During Therapy WFL for tasks assessed/performed             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,had a MRI, was prescribed testosterone by endocrinology   Sleep apnea    does't use cpap   Sudden hearing loss, left 2017   idiopathic sensorineural hearing loss   Past Surgical History:  Procedure Laterality Date   TOTAL KNEE ARTHROPLASTY Right 2005   TOTAL KNEE ARTHROPLASTY  12/04/2011   LEFT-- TOTAL KNEE ARTHROPLASTY;  Surgeon: Eugenia Mcalpine, MD;  Location: [facility];  Service: Orthopedics;  Laterality: Left;   Patient Active Problem List   Diagnosis Date Noted   Hemiparesis affecting right side as late effect of cerebrovascular accident (CVA) (HCC) 09/04/2022   Gait disturbance, post-stroke 09/04/2022   Hyponatremia 07/18/2022   Aphasia due to acute cerebrovascular accident (CVA) (HCC) 07/15/2022   Acute ischemic left MCA stroke (HCC) 06/24/2022   Stroke (cerebrum) (HCC) 06/21/2022   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 MRI showed large MCA territory basal ganglia infarct with petechial hemorrhage with no hemorrhagic transformation or mass effect.   Inpatient Rehab: 06/24/2022 - 07/30/2022 Patient discharged at [facility] Assist level.  Patient's care partners able to provide the necessary physical assistance at discharge.   Recommendation:  Patient will benefit from ongoing skilled OT services in home health setting to continue to advance functional skills in  the area of BADL, iADL, and Reduce care partner burden. Equipment: Shower chair, commode, RW, R RW hand splint, R resting hand splint, LH sponge, Dycem   Home Health therapies 07/24 - 08/24     PRECAUTIONS: Fall  WEIGHT BEARING RESTRICTIONS: No  PAIN:  Are you having pain? No  FALLS: Has patient fallen in last 6 months? Yes. Number of falls 1x - fell on his bottom about a month ago - got the walker stuck on the doorframe  LIVING ENVIRONMENT: Lives with:  lives alone and Caregiver 3-4 days/week x 12 hours, daughter, son and brother-in-law assist on other days Lives in: House/apartment Stairs: Yes: External: 3-5 steps; can reach both Has following equipment at home: Environmental consultant - 2 wheeled, Wheelchair (manual), Shower bench, bed side commode, Grab bars, and urinal  PLOF: Independent - still driving, played golf, retired Immunologist with lots traveling (driving)  PATIENT GOALS: To get my right side back working again  OBJECTIVE:   HAND DOMINANCE: Right (affected side)  ADLs: Overall ADLs: Min to mod assistance Transfers/ambulation related to ADLs: Supervision Eating: Gets help to cut food as needed, having to use L hand Grooming: Used regular razor yesterday for the first time UB Dressing: Needs help to get R arm in sleeve LB Dressing: Can pull pants up most of the time Toileting: Most of the time he is able to perform on his own; uses urinal at bedside at night Bathing: "50/50" with caregiver Tub Shower transfers: Gets help to get his leg into the tub Equipment: Transfer tub bench, Grab bars, bed side commode, and Long handled sponge  IADLs: Shopping: caregiver and family (daughter) Light housekeeping: Caregiver Meal Prep: Psychologist, counselling mobility: RW with hand orthosis & dycem + strap for R hand Medication management: Assistance from caregivers Financial management: Assistance from family Handwriting: unable with R UE; not assessed with L UE  MOBILITY STATUS: Needs Assist: Help to get his R UE/hand on the handle of his walker and Hx of falls  POSTURE COMMENTS:  rounded shoulders and forward head Sitting balance:  WFL in arm chair at table  ACTIVITY TOLERANCE: Activity tolerance: Limited due to R hemiplegia  FUNCTIONAL OUTCOME MEASURES: PSFS - 1.0  UPPER EXTREMITY ROM:    Active ROM Right eval Left eval  Shoulder flexion  [facility]  Shoulder abduction    Shoulder adduction    Shoulder extension min   Shoulder  internal rotation    Shoulder external rotation    Elbow flexion min   Elbow extension    Wrist flexion min   Wrist extension    Wrist ulnar deviation    Wrist radial deviation    Wrist pronation    Wrist supination    (Blank rows = not tested)  UPPER EXTREMITY MMT:     MMT Right eval Left eval  Shoulder flexion  4/5  Shoulder abduction    Shoulder adduction    Shoulder extension    Shoulder internal rotation    Shoulder external rotation    Middle trapezius    Lower trapezius    Elbow flexion    Elbow extension    Wrist flexion    Wrist extension    Wrist ulnar deviation    Wrist radial deviation    Wrist pronation    Wrist supination    (Blank rows = not tested)  HAND FUNCTION: Grip strength: Right: 3.9, 3.7, 5.2 lbs; Left: 34.8, 36.5, 37.9 lbs Average: Right 4.3 lbs Left 36.4 lbs  COORDINATION: Unable  to perform R UE coordinaiton tasks,   SENSATION: WFL  EDEMA: Slight RUE  MUSCLE TONE: RUE: Hypotonic and Flaccid  COGNITION: Overall cognitive status: Within functional limits for tasks assessed and h/o more severe aphasia and does self-report need to think about what he is trying to say etc  VISION: Subjective report: Eye Dr ~ 3 weeks Baseline vision: Wears glasses for reading only Visual history: cataracts  VISION ASSESSMENT: TBA  Patient is not having difficulty with activities due to vision.  PERCEPTION: Not tested  PRAXIS: Not tested  OBSERVATIONS: Pt ambulated into clinic w/RW and R hand orthosis. Patient was wearing his AFO which he stated he stopped wearing after he finished home health therapy.   TODAY'S TREATMENT:                                                                                                                                Neuro Rehab Neuromuscular reeducation differs from therapeutic exercise in that neuromuscular reeducation requires specific cueing and assistance to stimulate the neuromuscular system and promote  functional movement.  You can. Finger squeezing (flexion) Forearm turning in (pronation)/out (supination) Elbow pull (flexion) Shoulder shrugs  Shoulder blade pinches  It's harder to.  Open fingers (extension) Elbow push (extension)  PATIENT EDUCATION: Education details: OT role and POC considerations Person educated: Patient and Caregiver /Aide Education method: Explanation Education comprehension: verbalized understanding and needs further education  HOME EXERCISE PROGRAM: TBD   GOALS: Goals reviewed with patient? Yes  SHORT TERM GOALS: Target date: 11/13/22  Patient will demonstrate updated RUE HEP with 25% verbal cues or less for proper execution.  Baseline: New to outpatient OT.  Self reported difficulty with motivation at home Goal status: INITIAL  2.  Patient will demonstrate at least 8+ lbs RUE grip strength as needed to stabilize container on tabletop/hold large handled object.  Baseline: Right 4.3 lbs Goal status: INITIAL  3.  Patient will be able to don T-shirt without physical assistance x 5 days in a row. Baseline: Caregiver assistance to get R UE in sleeve Goal status: INITIAL  4.  Patient will improve R UE AROM for shoulder elevation and elbow flexion to lift arm/hand to walker.  Baseline: Caregiver assist or uses LUE.  Trace elbow/shoulder movement Goal status: INITIAL  5.  Patient will be assisted to don/doff appropriate splints/positioning devices to minimize subluxation and minimize risk of RUE/hand contracture. Baseline: None place Goal status: INITIAL   LONG TERM GOALS: Target date: 12/11/22  Patient will demonstrate updated RUE HEP with visual schedule and images for proper execution and frequency. Baseline: New to outpatient OT.  Self reported difficulty with motivation at home. Goal status: INITIAL  2.  Patient will demonstrate at least 10 lbs RUE grip strength as needed to hold objects for manipulation by LUE. Baseline: Right 4.3  lbs Goal status: INITIAL  3.  Patient will be able to get to EOB with no physical assistance to  toilet with BSC as needed. Baseline: Assistance to get to EOB from caregivers. Goal status: INITIAL  4.  Patient will be able to prepare simple food items with appropriate AE (small crock pot, microwave, toaster oven etc) Baseline: Caregiver/family perform meal prep  Goal status: INITIAL  5.  Patient will report at least two-point increase in average PSFS score or at least three-point increase in a single activity score indicating functionally significant improvement given minimum detectable change. Baseline: 1.0 total score (See above for individual activity scores)  Goal status: INITIAL   ASSESSMENT:  CLINICAL IMPRESSION: Patient is a 72 y.o. male who was seen today for occupational therapy evaluation for Acute ischemic left MCA stroke. Hx includes  Hypertension, gout, DM2. Patient currently presents significantly below PLOF.  Baseline level of function was independent and he is  demonstrating severe functional deficits and impairments due to R hemiplegia. Pt would benefit from skilled OT services in the outpatient setting to work on impairments to help pt return to higher independence, safety and as modified independent as possible.      PERFORMANCE DEFICITS: in functional skills including ADLs, coordination, dexterity, proprioception, sensation, tone, ROM, strength, flexibility, Fine motor control, Gross motor control, mobility, balance, endurance, decreased knowledge of use of DME, skin integrity, and UE functional use, cognitive skills including energy/drive, safety awareness, and sequencing, and psychosocial skills including coping strategies, environmental adaptation, and routines and behaviors.   IMPAIRMENTS: are limiting patient from ADLs, IADLs, rest and sleep, leisure, and social participation.   CO-MORBIDITIES: may have co-morbidities  that affects occupational performance. Patient  will benefit from skilled OT to address above impairments and improve overall function.  MODIFICATION OR ASSISTANCE TO COMPLETE EVALUATION: No modification of tasks or assist necessary to complete an evaluation.  OT OCCUPATIONAL PROFILE AND HISTORY: Problem focused assessment: Including review of records relating to presenting problem.  CLINICAL DECISION MAKING: LOW - limited treatment options, no task modification necessary  REHAB POTENTIAL: Good   PLAN:  OT FREQUENCY: 1-2x/week  OT DURATION: 8 weeks  PLANNED INTERVENTIONS: self care/ADL training, therapeutic exercise, therapeutic activity, neuromuscular re-education, manual therapy, passive range of motion, balance training, functional mobility training, aquatic therapy, splinting, electrical stimulation, patient/family education, cognitive remediation/compensation, visual/perceptual remediation/compensation, energy conservation, coping strategies training, and DME and/or AE instructions  RECOMMENDED OTHER SERVICES: PT evaluation already completed.  CONSULTED AND AGREED WITH PLAN OF CARE: Patient and family member/caregiver  PLAN FOR NEXT SESSION:  Review previous HEPs for continued appropriateness and need for updating E-stim protocols for R UE AAROM Splinting needs (?) ie) hemi sling/hand splint  Consideration for Neuro Hand Glove options Geraldo Pitter option Myomo v. Darrol Poke, OT 10/20/2022, 5:54 PM

## 2022-10-20 NOTE — Therapy (Signed)
OUTPATIENT PHYSICAL THERAPY NEURO TREATMENT   Patient Name: Jimmy Valentine MRN: [id number] DOB:[birth year], 72 y.o., male Today's Date: 10/20/2022   PCP: Wanda Plump, MD REFERRING PROVIDER: Wanda Plump, MD  END OF SESSION:  PT End of Session - 10/20/22 1455     Visit Number 2    Number of Visits 17   Plus eval   Date for PT Re-Evaluation 12/09/22    Authorization Type [employer]    PT Start Time 1450    PT Stop Time 1530    PT Time Calculation (min) 40 min    Equipment Utilized During Treatment Gait belt    Activity Tolerance Patient tolerated treatment well    Behavior During Therapy WFL for tasks assessed/performed              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,had a MRI, was prescribed testosterone by endocrinology   Sleep apnea    does't use cpap   Sudden hearing loss, left 2017   idiopathic sensorineural hearing loss   Past Surgical History:  Procedure Laterality Date   TOTAL KNEE ARTHROPLASTY Right 2005   TOTAL KNEE ARTHROPLASTY  12/04/2011   LEFT-- TOTAL KNEE ARTHROPLASTY;  Surgeon: Eugenia Mcalpine, MD;  Location: [facility];  Service: Orthopedics;  Laterality: Left;   Patient Active Problem List   Diagnosis Date Noted   Hemiparesis affecting right side as late effect of cerebrovascular accident (CVA) (HCC) 09/04/2022   Gait disturbance, post-stroke 09/04/2022   Hyponatremia 07/18/2022   Aphasia due to acute cerebrovascular accident (CVA) (HCC) 07/15/2022   Acute ischemic left MCA stroke (HCC) 06/24/2022   Stroke (cerebrum) (HCC) 06/21/2022   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  10/18/2014   Elevated LFTs 04/13/2014   Diabetes (HCC) 09/03/2011   Gout 09/03/2011   OSA (obstructive sleep apnea) 04/22/2011   Annual physical exam 12/04/2010   Hypertension    Hyperlipidemia    Hypogonadism male    Erectile dysfunction     ONSET DATE: 09/23/2022 (referral)   REFERRING DIAG: N82.956 (ICD-10-CM) - Hemiparesis affecting right side as late effect of cerebrovascular accident (CVA) (HCC)  THERAPY DIAG:  Muscle weakness (generalized)  Unsteadiness on feet  Other abnormalities of gait and mobility  Rationale for Evaluation and Treatment: Rehabilitation  SUBJECTIVE:  SUBJECTIVE STATEMENT: Pt denies any acute changes since last visit, no complaints of pain.  Pt accompanied by:  Caregiver, [name] (in lobby)   PERTINENT HISTORY: L MCA CVA in May 2024   PAIN:  Are you having pain? No  PRECAUTIONS: Fall  WEIGHT BEARING RESTRICTIONS: No  FALLS: Has patient fallen in last 6 months? Yes. Number of falls 1  LIVING ENVIRONMENT: Lives with: lives alone Lives in: House/apartment Stairs: Yes: External: 3 steps; on right going up, on left going up, and can reach both Has following equipment at home: Walker - 2 wheeled, Wheelchair (manual), shower chair, bed side commode, and AFO  PLOF: Independent  PATIENT GOALS: "Get back to where I can walk by myself without the walker and without assistance"   OBJECTIVE:   DIAGNOSTIC FINDINGS: MRI of brain on 06/22/22  IMPRESSION: 1. Positive for Left MCA territory lacunar type infarct tracking from the left corona radiata to the lentiform. Petechial hemorrhage (Heidelberg classification 1b: HI2). No malignant hemorrhagic transformation. No significant mass effect.   2. No other acute intracranial abnormality. 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  CT angio of head/neck on 06/21/22 IMPRESSION: 1. 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 The left MCA bi/trifurcation and left M2 branches remain patent.   2. Superimposed Severe intracranial and cervical ICA Atherosclerosis resulting in: - distal Right Vertebral Artery (V4) OCCLUSION. - distal Left Vertebral Artery Severe (V4) stenosis. - Right ICA origin RADIOGRAPHIC STRING SIGN stenosis due to calcified plaque. - Left ICA origin stenosis due to complex plaque estimated 65-70%. - Severe Left ICA supraclinoid stenosis due to calcified plaque.  COGNITION: Overall cognitive status: Within functional limits for tasks assessed   SENSATION: Pt denies numbness/tingling in RLE/RUE   COORDINATION: Heel to shin test: WNL on LLE, unable to perform hip flexion on RLE but able to achieve cross-legged position    MUSCLE TONE: Mild clonus in R foot that fatigues after 3s   POSTURE: rounded shoulders and forward head   LOWER EXTREMITY MMT:  Tested in seated position   MMT Right Eval Left Eval  Hip flexion 2- 4+  Hip extension    Hip abduction 1 4+  Hip adduction 2 4+  Hip internal rotation    Hip external rotation    Knee flexion 3 4+  Knee extension 2 4+  Ankle dorsiflexion 4- 4+  Ankle plantarflexion    Ankle inversion    Ankle eversion    (Blank rows = not tested)  BED MOBILITY:  Has a rail but requires assistance (up to max) to roll and perform sit <>supine   TRANSFERS: Assistive device utilized: Environmental consultant - 2 wheeled  Sit to stand: SBA Stand to sit: SBA Pt kickstands RLE and leans to L side to perform    GAIT: Gait pattern: step to pattern, decreased step length- Right, decreased stride length, decreased hip/knee flexion-  Right, decreased ankle dorsiflexion- Right, Left hip hike, lateral lean- Left, and poor foot clearance- Right Distance walked: Various clinic distances  Assistive device utilized: Walker - 2 wheeled Level of assistance: CGA and Min A Comments: Pt not wearing AFO this date. Noted pt dragging RLE on ground, especially w/fatigue, and would lose balance anteriorly requiring min A to stabilize. Pt also vaults to progress RLE and frequently tips RW  over on L side.     TODAY'S TREATMENT:       TherAct Vitals:   10/20/22 1459  BP: (!) 156/87  Pulse: 88    [facility] PT Assessment - 10/20/22 1509       6 minute walk test results    Aerobic Endurance Distance Walked 213    Endurance additional comments RPE 6/10      Standardized Balance Assessment   Standardized Balance Assessment Berg Balance Test      Berg Balance Test   Sit to Stand Able to stand without using hands and stabilize independently    Standing Unsupported Able to stand 2 minutes with supervision    Sitting with Back Unsupported but Feet Supported on Floor or Stool Able to sit safely and securely 2 minutes    Stand to Sit Sits safely with minimal use of hands    Transfers Needs one person to assist    Standing Unsupported with Eyes Closed Able to stand 10 seconds with supervision    Standing Unsupported with Feet Together Able to place feet together independently and stand for 1 minute with supervision    From Standing, Reach Forward with Outstretched Arm Loses balance while trying/requires external support    From Standing Position, Pick up Object from Floor Unable to try/needs assist to keep balance    From Standing Position, Turn to Look Behind Over each Shoulder Turn sideways only but maintains balance    Turn 360 Degrees Needs assistance while turning            Will need to finish assessing Berg next session, unable to complete due to time constraints  Gait Gait pattern: decreased hip/knee flexion- Right, decreased  ankle dorsiflexion- Right, lateral lean- Left, and poor foot clearance- Right Distance walked: various clinic distances Assistive device utilized: Environmental consultant - 2 wheeled Level of assistance: Min A Comments: trial gait with R AFO, does still catch foot on the floor even with toe cap, L lateral lean to clear limb  Comments: without AFO - has increased lean to the L and increased instances of R foot catching; encouraged him to continue to wear AFO                                                                                                                     PATIENT EDUCATION: Education details: continue bringing AFO and shoe w/toe cap to therapy sessions Person educated: Patient Education method: Explanation and Demonstration Education comprehension: verbalized understanding  HOME EXERCISE PROGRAM: To be established   GOALS: Goals reviewed with patient? Yes  SHORT TERM GOALS: Target date: 10/28/2022   Pt will perform initial HEP w/min A from caregiver for improved strength, balance, transfers and gait.  Baseline: to be established  Goal status: INITIAL  2.  Pt will improve gait velocity to at least 0.7 ft/s w/RW and CGA for improved gait efficiency and independence   Baseline: 0.49 ft/s w/RW and min A Goal status: INITIAL  3.  Pt will  be able to don and doff R hand orthotic on RW w/CGA for improved independence and weight shift to R side  Baseline: max A  Goal status: INITIAL  4.  Pt will ambulate greater than or equal to 300 feet on with LRAD and CGA for improved cardiovascular endurance and BLE strength.    Baseline: 213 ft with RW and min A (9/17) Goal status: INITIAL  5.  Berg to be assessed and LTG written  Baseline:  Goal status: INITIAL  6.  Pt will perform supine > sit EOB w/min A or use of rail for improved independence and functional strength Baseline: mod-max A Goal status: INITIAL  LONG TERM GOALS: Target date: 11/25/2022   Pt will be independent with  final HEP for improved strength, balance, transfers and gait.  Baseline:  Goal status: INITIAL  2.  Pt will improve gait velocity to at least 1.0 ft/s w/LRAD and SBA for improved gait efficiency and independence   Baseline:  Goal status: INITIAL  3.  Pt will ambulate greater than or equal to 400 feet on with LRAD and CGA for improved cardiovascular endurance and BLE strength.   Baseline: 213 ft with RW and min A (9/17)  Goal status: INITIAL  4.  Berg goal  Baseline:  Goal status: INITIAL  5.  Pt will perform bed mobility at CGA or SBA level for improved independence at home  Baseline: mod-max A Goal status: INITIAL   ASSESSMENT:  CLINICAL IMPRESSION: Emphasis of skilled PT session on assessing , initiating Berg assessment, and assessing gait with and without AFO. Pt only able to ambulate 213 ft during indicating impaired endurance. Unable to complete Berg assessment due to time constraints, will complete assessment next session. Pt also continues to exhibit decreased RLE clearance with gait impairments as noted above, does exhibit improved clearance with use of AFO with decreased L lateral lean noted with use of AFO. Encouraged pt to continue to wear his AFO and will work towards increased independence with donning device. Continue POC.    OBJECTIVE IMPAIRMENTS: Abnormal gait, decreased activity tolerance, decreased balance, decreased coordination, decreased mobility, difficulty walking, decreased strength, impaired tone, impaired UE functional use, improper body mechanics, and pain.   ACTIVITY LIMITATIONS: carrying, lifting, standing, squatting, stairs, transfers, bed mobility, bathing, toileting, dressing, self feeding, reach over head, hygiene/grooming, locomotion level, and caring for others  PARTICIPATION LIMITATIONS: meal prep, cleaning, laundry, medication management, personal finances, interpersonal relationship, driving, shopping, community activity, and yard  work  PERSONAL FACTORS: Fitness, Past/current experiences, Transportation, and 1 comorbidity: CVA  are also affecting patient's functional outcome.   REHAB POTENTIAL: Good  CLINICAL DECISION MAKING: Evolving/moderate complexity  EVALUATION COMPLEXITY: Moderate  PLAN:  PT FREQUENCY: 2x/week  PT DURATION: 8 weeks (POC written for 10 weeks due to delay in scheduling)   PLANNED INTERVENTIONS: Therapeutic exercises, Therapeutic activity, Neuromuscular re-education, Balance training, Gait training, Patient/Family education, Self Care, Joint mobilization, Stair training, Orthotic/Fit training, DME instructions, Aquatic Therapy, Dry Needling, Electrical stimulation, Manual therapy, and Re-evaluation  PLAN FOR NEXT SESSION: finish Berg and update goals.  RLE NMR, weight shift to R side, work on bed mobility and donning/doffing RUE from hand orthotic and donning/doffing AFO more independently    Peter Congo, PT, DPT, CSRS  10/20/2022, 5:32 PM

## 2022-10-21 ENCOUNTER — Encounter: Payer: Self-pay | Admitting: Neurology

## 2022-10-21 ENCOUNTER — Ambulatory Visit: Payer: Medicare HMO | Admitting: Neurology

## 2022-10-21 VITALS — BP 146/73 | HR 98 | Ht 69.0 in | Wt 190.0 lb

## 2022-10-21 DIAGNOSIS — I6522 Occlusion and stenosis of left carotid artery: Secondary | ICD-10-CM

## 2022-10-21 DIAGNOSIS — G8191 Hemiplegia, unspecified affecting right dominant side: Secondary | ICD-10-CM | POA: Diagnosis not present

## 2022-10-21 NOTE — Progress Notes (Signed)
[facility] [address] [place]. [place] [address] [phone]       OFFICE FOLLOW-UP NOTE  [name] Date of Birth:  1950-03-12 Medical Record Number:  [id number]   HPI: Jimmy Valentine is a 71 year old Caucasian male seen today for initial office follow-up visit following hospital admission for stroke in May 2024.  History is obtained from the patient and review of electronic medical records.  I personally reviewed pertinent available imaging films in PACS.  He has past medical history of hypertension, hyperlipidemia, sleep apnea, diabetes, arthritis and left-sided hearing loss.  He presented to the emergency room on 06/21/2022 with sudden onset of right-sided weakness.  He went to sleep at 10 PM the night prior and woke up in the middle of the night the next day at 3 AM still feeling normal.  He went back to sleep and when he woke up later at 4 AM he noted having trouble turning over in the bed due to right-sided weakness.  He called his neighbor at 49 and never called EMS.  He was found to have right hemiplegia with right facial droop but was able to answer questions and follow commands.  Symptoms resolved spontaneously en route to the emergency room but then subsequently recurred.  CT head showed questionable hyperdense left M1 segment of middle cerebral artery and chronic lacunar infarct in the left subcortical region.  CT angiogram of the head and neck was negative for any complete LVO but showed adherent thrombus in the left MCA M1 segment resulting in high-grade stenosis with preserved distal flow.  There were also changes of superimposed severe intracranial and cervical atherosclerosis.  There was occlusion of the distal right vertebral artery in the V4 segment left vertebral artery in the V4 segment had severe stenosis.  Calcified plaque at both carotid bifurcations suggesting severe right and moderate left ICA stenosis however carotid ultrasound showed only 1-39%  stenosis bilaterally suggesting CTA measurement of stenosis was CTA likely error in read after review and discussion with vascular surgery.  2D echo showed ejection fraction of 65 to 70%.  LDL cholesterol 91 mg percent.  Hemoglobin A1c was 6.7.  Patient was started on aspirin and Plavix for 3 months followed by aspirin monotherapy and advised aggressive risk factor modification.  Patient states he is currently living at home.  He has a caregiver for 12 hours during the day and family checks on him via phone later.  He has shown improvement though his right upper extremity still quite plegic with minimal movement but his right leg strength has improved significantly.  He is able to walk with a foot brace.  He is currently doing outpatient physical and Occupational Therapy which started yesterday.  He needs help with changing clothes and the showers but can manage most activities by himself.  He still has some occasional slurring on certain words particularly when he is tired.  He feels his balance is improving.  He is able to walk with a walker.  He has had no falls or injuries.  He has stopped the Plavix and is now on aspirin alone and tolerating it well without side effects.  States his blood pressure and sugars under good control.  He had lab work on 10/12/2022 which showed LDL-cholesterol to be optimal at 57 mg percent and hemoglobin A1c was 6.5.  ROS:   14 system review of systems is positive for weakness, difficulty walking, imbalance all other systems negative  PMH:  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,had a MRI, was prescribed testosterone by endocrinology   Sleep apnea    does't use cpap   Sudden hearing loss, left 2017   idiopathic sensorineural hearing loss     Social History:  Social History   Socioeconomic History   Marital status: Widowed    Spouse name: married   Number of children: 0   Years of education: Not on file   Highest education level: Not on file  Occupational History   Occupation: fully retired-sales rep  Tobacco Use   Smoking status: Former    Current packs/day: 0.00    Average packs/day: 1 pack/day for 15.0 years (15.0 ttl pk-yrs)    Types: Cigarettes    Start date: 12/03/1980    Quit date: 12/04/1995    Years since quitting: 26.8   Smokeless tobacco: Never   Tobacco comments:    occ cigars   Substance and Sexual Activity   Alcohol use: Yes    Alcohol/week: 12.0 standard drinks of alcohol    Types: 12 Standard drinks or equivalent per week    Comment: social drinker   Drug use: No   Sexual activity: Not Currently  Other Topics Concern   Not on file  Social History Narrative   Lost first wife remotely.   Widow, lost 2nd wife 08-03-16   Lives alone, no biological children   2 step children ([place], [place])  from second wife , still very close to them      Social Determinants of Health   Financial Resource Strain: Low Risk  (10/01/2020)   Overall Financial Resource Strain (CARDIA)    Difficulty of Paying Living Expenses: Not hard at all  Food Insecurity: No Food Insecurity (06/22/2022)   Hunger Vital Sign    Worried About Running Out of Food in the Last Year: Never true    Ran Out of Food in the Last Year: Never true  Transportation Needs: No Transportation Needs (06/22/2022)   PRAPARE - Administrator, Civil Service (Medical): No    Lack of Transportation (Non-Medical): No  Physical Activity: Insufficiently Active (10/01/2020)   Exercise Vital Sign    Days of Exercise per Week: 3 days    Minutes of Exercise per Session: 30 min  Stress: No Stress Concern Present (10/01/2020)   [employer] of Occupational Health - Occupational Stress Questionnaire    Feeling of Stress : Not at all  Social  Connections: Socially Isolated (10/01/2020)   Social Connection and Isolation Panel [NHANES]    Frequency of Communication with Friends and Family: More than three times a week    Frequency of Social Gatherings with Friends and Family: More than three times a week    Attends Religious Services: Never    Database administrator or Organizations: No    Attends Banker Meetings: Never    Marital Status: Widowed  Intimate Partner Violence: Not At Risk (06/22/2022)   Humiliation, Afraid, Rape, and Kick questionnaire    Fear of Current or Ex-Partner: No    Emotionally Abused: No    Physically Abused: No    Sexually Abused: No    Medications:   Current Outpatient Medications on File Prior to  Visit  Medication Sig Dispense Refill   acetaminophen (TYLENOL) 325 MG tablet Take 1-2 tablets (325-650 mg total) by mouth every 4 (four) hours as needed for mild pain.     amLODipine (NORVASC) 10 MG tablet Take 1 tablet (10 mg total) by mouth daily. 90 tablet 1   atorvastatin (LIPITOR) 80 MG tablet Take 1 tablet (80 mg total) by mouth at bedtime. 90 tablet 1   colchicine 0.6 MG tablet Take 1 tablet (0.6 mg total) by mouth daily.     diclofenac Sodium (VOLTAREN) 1 % GEL Apply 2 g topically 3 (three) times daily.     irbesartan (AVAPRO) 75 MG tablet Take 1 tablet (75 mg total) by mouth daily. 90 tablet 1   magnesium oxide (MAG-OX) 400 MG tablet Take 1 tablet (400 mg total) by mouth daily at 2 PM. 120 tablet 0   metFORMIN (GLUCOPHAGE) 500 MG tablet Take 1 tablet (500 mg total) by mouth 2 (two) times daily with a meal. 180 tablet 1   aspirin EC 81 MG tablet Take 1 tablet (81 mg total) by mouth daily. Swallow whole. (Patient not taking: Reported on 10/12/2022) 30 tablet 12   senna-docusate (SENOKOT-S) 8.6-50 MG tablet Take 2 tablets by mouth 2 (two) times daily. (Patient not taking: Reported on 10/12/2022)     No current facility-administered medications on file prior to visit.    Allergies:  No Known  Allergies  Physical Exam General: Obese elderly Caucasian male, seated, in no evident distress Head: head normocephalic and atraumatic.  Neck: supple with no carotid or supraclavicular bruits Cardiovascular: regular rate and rhythm, no murmurs Musculoskeletal: no deformity Skin:  no rash/petichiae Vascular:  Normal pulses all extremities Vitals:   10/21/22 0826  BP: (!) 146/73  Pulse: 98   Neurologic Exam Mental Status: Awake and fully alert. Oriented to place and time. Recent and remote memory intact. Attention span, concentration and fund of knowledge appropriate. Mood and affect appropriate.  No aphasia.  Mild dysarthria with occasional word slurred. Cranial Nerves: Fundoscopic exam reveals sharp disc margins. Pupils equal, briskly reactive to light. Extraocular movements full without nystagmus. Visual fields full to confrontation. Hearing intact. Facial sensation intact.  Moderate right lower facial weakness.  Tongue, palate moves normally and symmetrically.  Motor: Right hemiplegia with right upper extremity 1/5 strength with only trace movement in the shoulder and elbows.  Right lower extremity slight drift.  Increased tone in the right leg.  Mild right foot drop. Sensory.: intact to touch ,pinprick .position and vibratory sensation.  Coordination: Unable to test on the right.  Normal on the left.   Gait and Station: Arises from chair with  difficulty.  Gait is broad-based and slightly ataxic and uses a walker.  Slight right foot drop and stiffness of right leg Reflexes: 2+ and asymmetric and brisker on the right. Toes downgoing.   NIHSS  7 Modified Rankin  3   ASSESSMENT: 72 year old Caucasian male with left MCA infarct in May 2024 due to left M1 occlusion with underlying MCA stenosis with significant residual right hemiplegia.  Vascular risk factors of diabetes, hypertension, hyperlipidemia , sleep apnea, intracranial and extracranial stenosis and obesity     PLAN:I had a  long d/w patient and his caregiver about his recent stroke, residual right hemiplegia, intracranial carotid stenosis, risk for recurrent stroke/TIAs, personally independently reviewed imaging studies and stroke evaluation results and answered questions.Continue aspirin 81 mg daily  for secondary stroke prevention and maintain strict control of hypertension with blood pressure  goal below 130/90, diabetes with hemoglobin A1c goal below 6.5% and lipids with LDL cholesterol goal below 70 mg/dL. I also advised the patient to eat a healthy diet with plenty of whole grains, cereals, fruits and vegetables, exercise regularly and maintain ideal body weight .he was advised to use his walker at all times and we discussed fall prevention precautions.  Continue ongoing physical occupational therapy patient may also consider possible participation in the Sammy Martinez trial and was given information to review at home with his family and call us if interested.  Followup in the future with me in 6 months Greater than 50% of time during this 40 minute visit was spent on counseling,explanation of diagnosis of stroke, planning of further management, discussion with patient and family and coordination of care Delia Heady, MD Note: This document was prepared with digital dictation and possible smart phrase technology. Any transcriptional errors that result from this process are unintentional

## 2022-10-21 NOTE — Patient Instructions (Signed)
Handout provided with simple ROM instructions as noted below:  You can perfrom. Finger squeezing (flexion) Forearm turning in (pronation)/out (supination) Elbow pull (flexion) Shoulder shrugs  Shoulder blade pinches  It's harder to.  Open fingers (extension) Elbow push (extension)

## 2022-10-21 NOTE — Patient Instructions (Signed)
I had a long d/w patient and his caregiver about his recent stroke, residual right hemiplegia, intracranial carotid stenosis, risk for recurrent stroke/TIAs, personally independently reviewed imaging studies and stroke evaluation results and answered questions.Continue aspirin 81 mg daily  for secondary stroke prevention and maintain strict control of hypertension with blood pressure goal below 130/90, diabetes with hemoglobin A1c goal below 6.5% and lipids with LDL cholesterol goal below 70 mg/dL. I also advised the patient to eat a healthy diet with plenty of whole grains, cereals, fruits and vegetables, exercise regularly and maintain ideal body weight .he was advised to use his walker at all times and we discussed fall prevention precautions.  Continue ongoing physical occupational therapy patient may also consider possible participation in the [employer] trial and was given information to review at home with his family and call us if interested.  Followup in the future with me in 6 months Stroke Prevention Some medical conditions and behaviors can lead to a higher chance of having a stroke. You can help prevent a stroke by eating healthy, exercising, not smoking, and managing any medical conditions you have. Stroke is a leading cause of functional impairment. Primary prevention is particularly important because a majority of strokes are first-time events. Stroke changes the lives of not only those who experience a stroke but also their family and other caregivers. How can this condition affect me? A stroke is a medical emergency and should be treated right away. A stroke can lead to brain damage and can sometimes be life-threatening. If a person gets medical treatment right away, there is a better chance of surviving and recovering from a stroke. What can increase my risk? The following medical conditions may increase your risk of a stroke: Cardiovascular disease. High blood pressure  (hypertension). Diabetes. High cholesterol. Sickle cell disease. Blood clotting disorders (hypercoagulable state). Obesity. Sleep disorders (obstructive sleep apnea). Other risk factors include: Being older than age 6. Having a history of blood clots, stroke, or mini-stroke (transient ischemic attack, TIA). Genetic factors, such as race, ethnicity, or a family history of stroke. Smoking cigarettes or using other tobacco products. Taking birth control pills, especially if you also use tobacco. Heavy use of alcohol or drugs, especially cocaine and methamphetamine. Physical inactivity. What actions can I take to prevent this? Manage your health conditions High cholesterol levels. Eating a healthy diet is important for preventing high cholesterol. If cholesterol cannot be managed through diet alone, you may need to take medicines. Take any prescribed medicines to control your cholesterol as told by your health care provider. Hypertension. To reduce your risk of stroke, try to keep your blood pressure below 130/80. Eating a healthy diet and exercising regularly are important for controlling blood pressure. If these steps are not enough to manage your blood pressure, you may need to take medicines. Take any prescribed medicines to control hypertension as told by your health care provider. Ask your health care provider if you should monitor your blood pressure at home. Have your blood pressure checked every year, even if your blood pressure is normal. Blood pressure increases with age and some medical conditions. Diabetes. Eating a healthy diet and exercising regularly are important parts of managing your blood sugar (glucose). If your blood sugar cannot be managed through diet and exercise, you may need to take medicines. Take any prescribed medicines to control your diabetes as told by your health care provider. Get evaluated for obstructive sleep apnea. Talk to your health care provider  about getting a  sleep evaluation if you snore a lot or have excessive sleepiness. Make sure that any other medical conditions you have, such as atrial fibrillation or atherosclerosis, are managed. Nutrition Follow instructions from your health care provider about what to eat or drink to help manage your health condition. These instructions may include: Reducing your daily calorie intake. Limiting how much salt (sodium) you use to 1,500 milligrams (mg) each day. Using only healthy fats for cooking, such as olive oil, canola oil, or sunflower oil. Eating healthy foods. You can do this by: Choosing foods that are high in fiber, such as whole grains, and fresh fruits and vegetables. Eating at least 5 servings of fruits and vegetables a day. Try to fill one-half of your plate with fruits and vegetables at each meal. Choosing lean protein foods, such as lean cuts of meat, poultry without skin, fish, tofu, beans, and nuts. Eating low-fat dairy products. Avoiding foods that are high in sodium. This can help lower blood pressure. Avoiding foods that have saturated fat, trans fat, and cholesterol. This can help prevent high cholesterol. Avoiding processed and prepared foods. Counting your daily carbohydrate intake.  Lifestyle If you drink alcohol: Limit how much you have to: 0-1 drink a day for women who are not pregnant. 0-2 drinks a day for men. Know how much alcohol is in your drink. In the [place] one drink equals one 12 oz bottle of beer ( ), one 5 oz glass of wine ( ), or one 1 oz glass of hard liquor (44mL). Do not use any products that contain nicotine or tobacco. These products include cigarettes, chewing tobacco, and vaping devices, such as e-cigarettes. If you need help quitting, ask your health care provider. Avoid secondhand smoke. Do not use drugs. Activity  Try to stay at a healthy weight. Get at least 30 minutes of exercise on most days, such as: Fast  walking. Biking. Swimming. Medicines Take over-the-counter and prescription medicines only as told by your health care provider. Aspirin or blood thinners (antiplatelets or anticoagulants) may be recommended to reduce your risk of forming blood clots that can lead to stroke. Avoid taking birth control pills. Talk to your health care provider about the risks of taking birth control pills if: You are over 9 years old. You smoke. You get very bad headaches. You have had a blood clot. Where to find more information American Stroke Association: www.strokeassociation.org Get help right away if: You or a loved one has any symptoms of a stroke. "BE FAST" is an easy way to remember the main warning signs of a stroke: B - Balance. Signs are dizziness, sudden trouble walking, or loss of balance. E - Eyes. Signs are trouble seeing or a sudden change in vision. F - Face. Signs are sudden weakness or numbness of the face, or the face or eyelid drooping on one side. A - Arms. Signs are weakness or numbness in an arm. This happens suddenly and usually on one side of the body. S - Speech. Signs are sudden trouble speaking, slurred speech, or trouble understanding what people say. T - Time. Time to call emergency services. Write down what time symptoms started. You or a loved one has other signs of a stroke, such as: A sudden, severe headache with no known cause. Nausea or vomiting. Seizure. These symptoms may represent a serious problem that is an emergency. Do not wait to see if the symptoms will go away. Get medical help right away. Call your local emergency services (911 in the  [place] Do not drive yourself to the hospital. Summary You can help to prevent a stroke by eating healthy, exercising, not smoking, limiting alcohol intake, and managing any medical conditions you may have. Do not use any products that contain nicotine or tobacco. These include cigarettes, chewing tobacco, and vaping devices,  such as e-cigarettes. If you need help quitting, ask your health care provider. Remember "BE FAST" for warning signs of a stroke. Get help right away if you or a loved one has any of these signs. This information is not intended to replace advice given to you by your health care provider. Make sure you discuss any questions you have with your health care provider. Document Revised: 12/22/2021 Document Reviewed: 12/22/2021 Elsevier Patient Education  2024 [employer].

## 2022-10-22 ENCOUNTER — Ambulatory Visit: Payer: Medicare HMO | Admitting: Physical Therapy

## 2022-10-22 ENCOUNTER — Ambulatory Visit: Payer: Medicare HMO | Admitting: Occupational Therapy

## 2022-10-22 VITALS — BP 128/79 | HR 89

## 2022-10-22 DIAGNOSIS — M6281 Muscle weakness (generalized): Secondary | ICD-10-CM

## 2022-10-22 DIAGNOSIS — S43111D Subluxation of right acromioclavicular joint, subsequent encounter: Secondary | ICD-10-CM

## 2022-10-22 DIAGNOSIS — R2681 Unsteadiness on feet: Secondary | ICD-10-CM

## 2022-10-22 DIAGNOSIS — I69351 Hemiplegia and hemiparesis following cerebral infarction affecting right dominant side: Secondary | ICD-10-CM | POA: Diagnosis not present

## 2022-10-22 DIAGNOSIS — R278 Other lack of coordination: Secondary | ICD-10-CM | POA: Diagnosis not present

## 2022-10-22 DIAGNOSIS — I63512 Cerebral infarction due to unspecified occlusion or stenosis of left middle cerebral artery: Secondary | ICD-10-CM | POA: Diagnosis not present

## 2022-10-22 DIAGNOSIS — R2689 Other abnormalities of gait and mobility: Secondary | ICD-10-CM | POA: Diagnosis not present

## 2022-10-22 NOTE — Therapy (Signed)
OUTPATIENT OCCUPATIONAL THERAPY NEURO TREATMENT  Patient Name: Jimmy Valentine MRN: [id number] DOB:15-May-1950, 72 y.o., male Today's Date: 10/22/2022  PCP: Wanda Plump, MD REFERRING PROVIDER: Wanda Plump, MD  END OF SESSION:  OT End of Session - 10/22/22 1005     Visit Number 3    Number of Visits 16   + evaluaiton   Date for OT Re-Evaluation 12/11/22    Authorization Type [employer] 2024 Met VL: [place] Auth Not Reqd    OT Start Time 1014    OT Stop Time 1100    OT Time Calculation (min) 46 min    Equipment Utilized During Treatment Cones    Activity Tolerance Patient tolerated treatment well    Behavior During Therapy WFL for tasks assessed/performed             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,had a MRI, was prescribed testosterone by endocrinology   Sleep apnea    does't use cpap   Sudden hearing loss, left 2017   idiopathic sensorineural hearing loss   Past Surgical History:  Procedure Laterality Date   TOTAL KNEE ARTHROPLASTY Right 2005   TOTAL KNEE ARTHROPLASTY  12/04/2011   LEFT-- TOTAL KNEE ARTHROPLASTY;  Surgeon: Eugenia Mcalpine, MD;  Location: [facility];  Service: Orthopedics;  Laterality: Left;   Patient Active Problem List   Diagnosis Date Noted   Hemiparesis affecting right side as late effect of cerebrovascular accident (CVA) (HCC) 09/04/2022   Gait disturbance, post-stroke 09/04/2022   Hyponatremia 07/18/2022   Aphasia due to acute cerebrovascular accident (CVA) (HCC) 07/15/2022   Acute ischemic left MCA stroke (HCC) 06/24/2022   Stroke (cerebrum) (HCC) 06/21/2022   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 10/18/2014   Elevated LFTs 04/13/2014   Diabetes (HCC) 09/03/2011   Gout 09/03/2011   OSA (obstructive sleep apnea) 04/22/2011   Annual physical exam 12/04/2010   Hypertension    Hyperlipidemia    Hypogonadism male    Erectile dysfunction     ONSET DATE: Referral date: 10/01/2022  Stroke 06/21/22  REFERRING DIAG: I69.351 (ICD-10-CM) - Hemiplegia and hemiparesis following cerebral infarction affecting right dominant side (HCC)  THERAPY DIAG:   Hemiplegia and hemiparesis following cerebral infarction affecting right dominant side (HCC)  Muscle weakness (generalized)  Subluxation of acromioclavicular joint, right, subsequent encounter  Other lack of coordination  Rationale for Evaluation and Treatment: Rehabilitation  SUBJECTIVE:   SUBJECTIVE STATEMENT:  Pt states brought his hand splint for OT to check today.  He states that he hasn't used it much.   Pt accompanied by: self   PERTINENT HISTORY:  Presented to [facility] ED on 06/21/2022 with sudden onset of right-sided weakness, dysarthria and facial drooping. MRI showed large MCA territory basal ganglia infarct with petechial hemorrhage with no hemorrhagic transformation or mass effect.   Inpatient Rehab: 06/24/2022 - 07/30/2022 Patient discharged at [facility] Assist level.  Patient's care partners able to provide the necessary physical assistance at discharge.   Recommendation:  Patient will benefit from ongoing skilled OT services in home health setting to continue to  advance functional skills in the area of BADL, iADL, and Reduce care partner burden. Equipment: Shower chair, commode, RW, R RW hand splint, R resting hand splint, LH sponge, Dycem   Home Health therapies 07/24 - 08/24     PRECAUTIONS: Fall  WEIGHT BEARING RESTRICTIONS: No  PAIN:  Are you having pain? No  FALLS: Has patient fallen in last 6 months? Yes. Number of falls 1x - fell on his bottom about a month ago - got the walker stuck on  the doorframe  LIVING ENVIRONMENT: Lives with: lives alone and Caregiver 3-4 days/week x 12 hours, daughter, son and brother-in-law assist on other days Lives in: House/apartment Stairs: Yes: External: 3-5 steps; can reach both Has following equipment at home: Environmental consultant - 2 wheeled, Wheelchair (manual), Shower bench, bed side commode, Grab bars, and urinal  PLOF: Independent - still driving, played golf, retired Immunologist with lots traveling (driving)  PATIENT GOALS: To get my right side back working again  OBJECTIVE:   HAND DOMINANCE: Right (affected side)  ADLs: Overall ADLs: Min to mod assistance Transfers/ambulation related to ADLs: Supervision Eating: Gets help to cut food as needed, having to use L hand Grooming: Used regular razor yesterday for the first time UB Dressing: Needs help to get R arm in sleeve LB Dressing: Can pull pants up most of the time Toileting: Most of the time he is able to perform on his own; uses urinal at bedside at night Bathing: "50/50" with caregiver Tub Shower transfers: Gets help to get his leg into the tub Equipment: Transfer tub bench, Grab bars, bed side commode, and Long handled sponge  IADLs: Shopping: caregiver and family (daughter) Light housekeeping: Caregiver Meal Prep: Psychologist, counselling mobility: RW with hand orthosis & dycem + strap for R hand Medication management: Assistance from caregivers Financial management: Assistance from family Handwriting: unable with R UE; not assessed with L UE  MOBILITY STATUS: Needs Assist: Help to get his R UE/hand on the handle of his walker and Hx of falls  POSTURE COMMENTS:  rounded shoulders and forward head Sitting balance:  WFL in arm chair at table  ACTIVITY TOLERANCE: Activity tolerance: Limited due to R hemiplegia  FUNCTIONAL OUTCOME MEASURES: PSFS - 1.0  UPPER EXTREMITY ROM:    Active ROM Right eval Left eval  Shoulder flexion  [facility]urgical [facility]  Shoulder abduction    Shoulder  adduction    Shoulder extension min   Shoulder internal rotation    Shoulder external rotation    Elbow flexion min   Elbow extension    Wrist flexion min   Wrist extension    Wrist ulnar deviation    Wrist radial deviation    Wrist pronation    Wrist supination    (Blank rows = not tested)  UPPER EXTREMITY MMT:     MMT Right eval Left eval  Shoulder flexion  4/5  Shoulder abduction    Shoulder adduction    Shoulder extension    Shoulder internal rotation    Shoulder external rotation    Middle trapezius    Lower trapezius    Elbow flexion    Elbow extension    Wrist flexion    Wrist extension    Wrist ulnar deviation    Wrist radial deviation    Wrist pronation    Wrist supination    (Blank rows = not tested)  HAND FUNCTION: Grip strength: Right: 3.9, 3.7, 5.2 lbs; Left: 34.8, 36.5, 37.9 lbs Average: Right 4.3 lbs Left 36.4  lbs  COORDINATION: Unable to perform R UE coordinaiton tasks,   SENSATION: WFL  EDEMA: Slight RUE  MUSCLE TONE: RUE: Hypotonic and Flaccid  COGNITION: Overall cognitive status: Within functional limits for tasks assessed and h/o more severe aphasia and does self-report need to think about what he is trying to say etc  VISION: Subjective report: Eye Dr ~ 3 weeks Baseline vision: Wears glasses for reading only Visual history: cataracts  VISION ASSESSMENT: TBA  Patient is not having difficulty with activities due to vision.  PERCEPTION: Not tested  PRAXIS: Not tested  OBSERVATIONS: Pt ambulated into clinic w/RW and R hand orthosis. Patient was wearing his AFO which he stated he stopped wearing after he finished home health therapy.   TODAY'S TREATMENT:                                                                                                                               Orthotic Pt brought his SoftPro resting hand splint but doesn't wear it much because it wasn't comfortable and didn't stretch his fingers much.  OTR  applied splint and noted need to lessen thumb stretch, straighten finger pan and noted need for additional velcro on soft good to allow straps to fasten more effectively.  Pt to bring his 2 splints next time to determine which will be better to modify for appropriate fit, comfort and stretch.  Neuro Rehab Neuromuscular reeducation including specific visual, tactile and verbal cues and assistance to stimulate the neuromuscular system and promote functional movement in RUE.  Reviewed and practiced motions he can perform with OTR providing arm support to combine 5 exercises worked on last session as combined to work on [employer] ie) picking up, lifting and placing cones repetitively with RUE and OTR supporting under elbow and wrist to guide motion and minimize subluxation.   1) Finger squeezing (flexion) - Pt practiced holding cones with guidance to move hand above medium cones and sliding his hand down before squeezing cone with no hand over hand assistance needed to grip container during assistance to lift arm.  2) Forearm turning out (supination) - Pt assisted to supinate RUE during cone stacking activities. 3) Elbow push (extension)/pull (flexion) - Task completed with arm/elbow on table top to start but then with hand support by OT under wrist and elbow to pick up cones and stack them.  Encouraged to isolate R arm/elbow movement to pick up and move cones 4) Shoulder shrugs - Task completed in sitting and then combine with arm movement with OTR supporting arm at elbow and wrist to minimize subluxation. Also combined task with weightbearing which minimizes palpable subluxation and is most comfortable with a small pillow or towel roll under a cupped hand versus flat palm. Although it's harder for patient to open his fingers , he can relax enough to remove fingers form cones with min support of cone.   PATIENT EDUCATION: Education details: [place] [facility]  Person educated: Patient  Education method:  Explanation, Demonstration, Tactile cues, and Verbal cues Education comprehension: verbalized understanding, returned demonstration, verbal cues required, tactile cues required, and needs further education  HOME EXERCISE PROGRAM: 10/20/22 - UE ROM (grip, forearm, elbow flex, shoulder shrugs) - no images provided only written suggestions.   GOALS: Goals reviewed with patient? Yes  SHORT TERM GOALS: Target date: 11/13/22  Patient will demonstrate updated RUE HEP with 25% verbal cues or less for proper execution.  Baseline: New to outpatient OT.  Self reported difficulty with motivation at home Goal status: IN progress  2.  Patient will demonstrate at least 8+ lbs RUE grip strength as needed to stabilize container on tabletop/hold large handled object.  Baseline: Right 4.3 lbs Goal status: IN progress  3.  Patient will be able to don T-shirt without physical assistance x 5 days in a row. Baseline: Caregiver assistance to get R UE in sleeve Goal status: INITIAL  4.  Patient will improve R UE AROM for shoulder elevation and elbow flexion to lift arm/hand to walker.  Baseline: Caregiver assist or uses LUE.  Trace elbow/shoulder movement Goal status: IN Progress  5.  Patient will be assisted to don/doff appropriate splints/positioning devices to minimize subluxation and minimize risk of RUE/hand contracture. Baseline: None place Goal status: IN Progress   LONG TERM GOALS: Target date: 12/11/22  Patient will demonstrate updated RUE HEP with visual schedule and images for proper execution and frequency. Baseline: New to outpatient OT.  Self reported difficulty with motivation at home. Goal status: IN Progress  2.  Patient will demonstrate at least 10 lbs RUE grip strength as needed to hold objects for manipulation by LUE. Baseline: Right 4.3 lbs Goal status: IN Progress  3.  Patient will be able to get to EOB with no physical assistance to toilet with BSC as needed. Baseline:  Assistance to get to EOB from caregivers. Goal status: INITIAL  4.  Patient will be able to prepare simple food items with appropriate AE (small crock pot, microwave, toaster oven etc) Baseline: Caregiver/family perform meal prep  Goal status: INITIAL  5.  Patient will report at least two-point increase in average PSFS score or at least three-point increase in a single activity score indicating functionally significant improvement given minimum detectable change. Baseline: 1.0 total score (See above for individual activity scores)  Goal status: INITIAL   ASSESSMENT:  CLINICAL IMPRESSION: Patient is a 72 y.o. male who was seen today for occupational therapy treatment for left MCA stroke with RUE weakness/hemiparesis. Pt responded well to neuro re-ed for R UE A/AROM activities involved to pick up and stack cones today.  Pt will benefit from trials of NMES for R UE to improve neuromuscular return and functional use of RUE.  He will benefit from continued skilled OT services in the outpatient setting to work on RUE impairments to help pt return to higher independence, safety and as modified independence as able.      PERFORMANCE DEFICITS: in functional skills including ADLs, coordination, dexterity, proprioception, sensation, tone, ROM, strength, flexibility, Fine motor control, Gross motor control, mobility, balance, endurance, decreased knowledge of use of DME, skin integrity, and UE functional use, cognitive skills including energy/drive, safety awareness, and sequencing, and psychosocial skills including coping strategies, environmental adaptation, and routines and behaviors.   IMPAIRMENTS: are limiting patient from ADLs, IADLs, rest and sleep, leisure, and social participation.   CO-MORBIDITIES: may have co-morbidities  that affects occupational performance. Patient will benefit from skilled OT to address above impairments  and improve overall function.  REHAB POTENTIAL: Good   PLAN:  OT  FREQUENCY: 1-2x/week  OT DURATION: 8 weeks  PLANNED INTERVENTIONS: self care/ADL training, therapeutic exercise, therapeutic activity, neuromuscular re-education, manual therapy, passive range of motion, balance training, functional mobility training, aquatic therapy, splinting, electrical stimulation, patient/family education, cognitive remediation/compensation, visual/perceptual remediation/compensation, energy conservation, coping strategies training, and DME and/or AE instructions  RECOMMENDED OTHER SERVICES: PT evaluation already completed.  CONSULTED AND AGREED WITH PLAN OF CARE: Patient and family member/caregiver  PLAN FOR NEXT SESSION:  Review/progress previous HEPs for continued appropriateness and need for updating - PRINT HEP images E-stim protocols for R UE AAROM Splinting needs - hand splint  Consideration for Neuro Hand Glove options [employer] option (online visual video watched 10/20/22) [employer] v. Darrol Poke, OT 10/22/2022, 12:27 PM

## 2022-10-22 NOTE — Therapy (Signed)
OUTPATIENT PHYSICAL THERAPY NEURO TREATMENT   Patient Name: Jimmy Valentine MRN: [id number] DOB:07/12/1950, 72 y.o., male Today's Date: 10/22/2022   PCP: Wanda Plump, MD REFERRING PROVIDER: Wanda Plump, MD  END OF SESSION:  PT End of Session - 10/22/22 1104     Visit Number 3    Number of Visits 17   Plus eval   Date for PT Re-Evaluation 12/09/22    Authorization Type [employer]    PT Start Time 1103   Handoff w/OT   PT Stop Time 1148    PT Time Calculation (min) 45 min    Equipment Utilized During Treatment Gait belt;Other (comment)   Bioness L300 thigh and lower leg cuff   Activity Tolerance Patient tolerated treatment well    Behavior During Therapy WFL for tasks assessed/performed               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,had a MRI, was prescribed testosterone by endocrinology   Sleep apnea    does't use cpap   Sudden hearing loss, left 2017   idiopathic sensorineural hearing loss   Past Surgical History:  Procedure Laterality Date   TOTAL KNEE ARTHROPLASTY Right 2005   TOTAL KNEE ARTHROPLASTY  12/04/2011   LEFT-- TOTAL KNEE ARTHROPLASTY;  Surgeon: Eugenia Mcalpine, MD;  Location: [facility];  Service: Orthopedics;  Laterality: Left;   Patient Active Problem List   Diagnosis Date Noted   Hemiparesis affecting right side as late effect of cerebrovascular accident (CVA) (HCC) 09/04/2022   Gait disturbance, post-stroke 09/04/2022   Hyponatremia 07/18/2022   Aphasia due to acute cerebrovascular accident (CVA) (HCC) 07/15/2022   Acute ischemic left MCA stroke (HCC) 06/24/2022   Stroke (cerebrum) (HCC) 06/21/2022   Chronic mycotic otitis externa 10/11/2019   Conductive hearing loss of left ear 01/05/2019   Sensorineural hearing  loss (SNHL), bilateral 09/24/2016    PCP NOTES >>>>>>>>>>>>>>>>>>. 10/18/2014   Elevated LFTs 04/13/2014   Diabetes (HCC) 09/03/2011   Gout 09/03/2011   OSA (obstructive sleep apnea) 04/22/2011   Annual physical exam 12/04/2010   Hypertension    Hyperlipidemia    Hypogonadism male    Erectile dysfunction     ONSET DATE: 09/23/2022 (referral)   REFERRING DIAG: B28.413 (ICD-10-CM) - Hemiparesis affecting right side as late effect of cerebrovascular accident (CVA) (HCC)  THERAPY DIAG:  Hemiplegia and hemiparesis following cerebral infarction affecting right dominant side (HCC)  Muscle weakness (generalized)  Unsteadiness on feet  Other abnormalities of gait and mobility  Other lack of coordination  Rationale for Evaluation and Treatment: Rehabilitation  SUBJECTIVE:  SUBJECTIVE STATEMENT: Pt denies any acute changes since last visit, no complaints of pain.  Pt accompanied by:  Caregiver, [name] (in lobby)   PERTINENT HISTORY: L MCA CVA in May 2024   PAIN:  Are you having pain? No  PRECAUTIONS: Fall  WEIGHT BEARING RESTRICTIONS: No  FALLS: Has patient fallen in last 6 months? Yes. Number of falls 1  LIVING ENVIRONMENT: Lives with: lives alone Lives in: House/apartment Stairs: Yes: External: 3 steps; on right going up, on left going up, and can reach both Has following equipment at home: Walker - 2 wheeled, Wheelchair (manual), shower chair, bed side commode, and AFO  PLOF: Independent  PATIENT GOALS: "Get back to where I can walk by myself without the walker and without assistance"   OBJECTIVE:   DIAGNOSTIC FINDINGS: MRI of brain on 06/22/22  IMPRESSION: 1. Positive for Left MCA territory lacunar type infarct tracking from the left corona radiata to the lentiform. Petechial  hemorrhage (Heidelberg classification 1b: HI2). No malignant hemorrhagic transformation. No significant mass effect.   2. No other acute intracranial abnormality. 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  CT angio of head/neck on 06/21/22 IMPRESSION: 1. 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 The left MCA bi/trifurcation and left M2 branches remain patent.   2. Superimposed Severe intracranial and cervical ICA Atherosclerosis resulting in: - distal Right Vertebral Artery (V4) OCCLUSION. - distal Left Vertebral Artery Severe (V4) stenosis. - Right ICA origin RADIOGRAPHIC STRING SIGN stenosis due to calcified plaque. - Left ICA origin stenosis due to complex plaque estimated 65-70%. - Severe Left ICA supraclinoid stenosis due to calcified plaque.  COGNITION: Overall cognitive status: Within functional limits for tasks assessed   SENSATION: Pt denies numbness/tingling in RLE/RUE   COORDINATION: Heel to shin test: WNL on LLE, unable to perform hip flexion on RLE but able to achieve cross-legged position    MUSCLE TONE: Mild clonus in R foot that fatigues after 3s   POSTURE: rounded shoulders and forward head   LOWER EXTREMITY MMT:  Tested in seated position   MMT Right Eval Left Eval  Hip flexion 2- 4+  Hip extension    Hip abduction 1 4+  Hip adduction 2 4+  Hip internal rotation    Hip external rotation    Knee flexion 3 4+  Knee extension 2 4+  Ankle dorsiflexion 4- 4+  Ankle plantarflexion    Ankle inversion    Ankle eversion    (Blank rows = not tested)  BED MOBILITY:  Has a rail but requires assistance (up to max) to roll and perform sit <>supine   TRANSFERS: Assistive device utilized: Environmental consultant - 2 wheeled  Sit to stand:  SBA Stand to sit: SBA Pt kickstands RLE and leans to L side to perform    GAIT: Gait pattern: step to pattern, decreased step length- Right, decreased stride length, decreased hip/knee flexion- Right, decreased ankle dorsiflexion- Right, Left hip hike, lateral lean- Left, and poor foot clearance- Right Distance walked: Various clinic distances  Assistive device utilized: Walker - 2 wheeled Level of assistance: CGA and Min A Comments: Pt not wearing AFO this date. Noted pt dragging RLE on ground, especially w/fatigue, and would lose balance anteriorly requiring min A to stabilize. Pt also vaults to progress RLE and frequently tips RW  over on L side.   VITALS Vitals:   10/22/22 1108  BP: 128/79  Pulse: 89      TODAY'S TREATMENT:       [facility]Act    [facility] PT Assessment - 10/22/22 1110       Berg Balance Test   Sit to Stand Able to stand without using hands and stabilize independently    Standing Unsupported Able to stand 2 minutes with supervision    Sitting with Back Unsupported but Feet Supported on Floor or Stool Able to sit safely and securely 2 minutes    Stand to Sit Sits safely with minimal use of hands    Transfers Needs one person to assist    Standing Unsupported with Eyes Closed Able to stand 10 seconds with supervision    Standing Unsupported with Feet Together Able to place feet together independently and stand for 1 minute with supervision    From Standing, Reach Forward with Outstretched Arm Loses balance while trying/requires external support    From Standing Position, Pick up Object from Floor Unable to try/needs assist to keep balance    From Standing Position, Turn to Look Behind Over each Shoulder Turn sideways only but maintains balance    Turn 360 Degrees Needs assistance while turning    Standing Unsupported, Alternately Place Feet on Step/Stool Needs assistance to keep from falling or unable to try    Standing Unsupported, One Foot in Front Needs help to step  but can hold 15 seconds    Standing on One Leg Unable to try or needs assist to prevent fall    Total Score 25    Berg comment: High fall risk            E-stim attended  Bioness set up to R anterior tibialis and quads for improved ankle DF and facilitation of hip flexion w/gait. See tablet 1 for details.   Gait Training  Gait pattern: step through pattern, decreased hip/knee flexion- Right, decreased ankle dorsiflexion- Right, circumduction- Right, ataxic, lateral lean- Left, and poor foot clearance- Right Distance walked: 345' Assistive device utilized: Environmental consultant - 2 wheeled Level of assistance: CGA Comments: With Bioness donned to R quads and anterior tibialis (no AFO), ambulated 345' around gym w/RW and CGA. Noted improved step clearance w/RLE due to increased hip flexion and decreased circumduction compensation. Pt did require increase in intensity of E-stim w/fatigue but did not catch his R foot throughout training. Pt occasionally demonstrating knee extension thrust on R side but no hyperextension noted. Min cues for reduced truncal lean to L side and pt able to maintain midline orientation more effectively, w/no tipping of RW noted this date. Min cues to facilitate knee flexion throughout and plan to trial Bioness on hamstrings next session.                                                                                                                      PATIENT EDUCATION: Education details: Plan to trial Bioness on  hamstrings next session, Sharlene Motts results  Person educated: Patient Education method: Explanation and Demonstration Education comprehension: verbalized understanding  HOME EXERCISE PROGRAM: To be established   GOALS: Goals reviewed with patient? Yes  SHORT TERM GOALS: Target date: 10/28/2022   Pt will perform initial HEP w/min A from caregiver for improved strength, balance, transfers and gait.  Baseline: to be established  Goal status: INITIAL  2.  Pt will  improve gait velocity to at least 0.7 ft/s w/RW and CGA for improved gait efficiency and independence   Baseline: 0.49 ft/s w/RW and min A Goal status: INITIAL  3.  Pt will be able to don and doff R hand orthotic on RW w/CGA for improved independence and weight shift to R side  Baseline: max A  Goal status: INITIAL  4.  Pt will ambulate greater than or equal to 300 feet on with LRAD and CGA for improved cardiovascular endurance and BLE strength.    Baseline: 213 ft with RW and min A (9/17) Goal status: INITIAL  5.  Berg to be assessed and LTG written  Baseline: 25/56 Goal status: MET  6.  Pt will perform supine > sit EOB w/min A or use of rail for improved independence and functional strength Baseline: mod-max A Goal status: INITIAL  LONG TERM GOALS: Target date: 11/25/2022   Pt will be independent with final HEP for improved strength, balance, transfers and gait.  Baseline:  Goal status: INITIAL  2.  Pt will improve gait velocity to at least 1.0 ft/s w/LRAD and SBA for improved gait efficiency and independence   Baseline:  Goal status: INITIAL  3.  Pt will ambulate greater than or equal to 400 feet on with LRAD and CGA for improved cardiovascular endurance and BLE strength.   Baseline: 213 ft with RW and min A (9/17)  Goal status: INITIAL  4.  Pt will improve Berg score to 34/56 for decreased fall risk  Baseline: 25/56 Goal status: REVISED  5.  Pt will perform bed mobility at CGA or SBA level for improved independence at home  Baseline: mod-max A Goal status: INITIAL   ASSESSMENT:  CLINICAL IMPRESSION: Emphasis of skilled PT session on balance assessment via Berg and gait training w/Bioness L300 thigh and lower leg cuff. Pt scored a 25/56 on Berg, indicative of high fall risk. Pt most challenged by single leg stance, lateral weight shifting and rotation. Trialed use of Bioness L300 lower leg and thigh cuff to anterior tibialis and quads on R side and  noted improved step clearance of RLE compared to w/use of AFO and toe cap. Continue POC.    OBJECTIVE IMPAIRMENTS: Abnormal gait, decreased activity tolerance, decreased balance, decreased coordination, decreased mobility, difficulty walking, decreased strength, impaired tone, impaired UE functional use, improper body mechanics, and pain.   ACTIVITY LIMITATIONS: carrying, lifting, standing, squatting, stairs, transfers, bed mobility, bathing, toileting, dressing, self feeding, reach over head, hygiene/grooming, locomotion level, and caring for others  PARTICIPATION LIMITATIONS: meal prep, cleaning, laundry, medication management, personal finances, interpersonal relationship, driving, shopping, community activity, and yard work  PERSONAL FACTORS: Fitness, Past/current experiences, Transportation, and 1 comorbidity: CVA  are also affecting patient's functional outcome.   REHAB POTENTIAL: Good  CLINICAL DECISION MAKING: Evolving/moderate complexity  EVALUATION COMPLEXITY: Moderate  PLAN:  PT FREQUENCY: 2x/week  PT DURATION: 8 weeks (POC written for 10 weeks due to delay in scheduling)   PLANNED INTERVENTIONS: Therapeutic exercises, Therapeutic activity, Neuromuscular re-education, Balance training, Gait training, Patient/Family education, Self Care,  Joint mobilization, Stair training, Orthotic/Fit training, DME instructions, Aquatic Therapy, Dry Needling, Electrical stimulation, Manual therapy, and Re-evaluation  PLAN FOR NEXT SESSION: Bioness to R hammies.  RLE NMR, weight shift to R side, work on bed mobility and donning/doffing RUE from hand orthotic and donning/doffing AFO more independently    Josephine Igo, PT, DPT [facility] [address] [place], [place]  [address] Phone:  [phone] Fax:  [phone]  10/22/2022, 11:55 AM

## 2022-10-27 ENCOUNTER — Ambulatory Visit: Payer: Medicare HMO | Admitting: Occupational Therapy

## 2022-10-27 ENCOUNTER — Ambulatory Visit: Payer: Medicare HMO | Admitting: Physical Therapy

## 2022-10-27 ENCOUNTER — Encounter: Payer: Self-pay | Admitting: Internal Medicine

## 2022-10-27 DIAGNOSIS — R278 Other lack of coordination: Secondary | ICD-10-CM

## 2022-10-27 DIAGNOSIS — I63512 Cerebral infarction due to unspecified occlusion or stenosis of left middle cerebral artery: Secondary | ICD-10-CM | POA: Diagnosis not present

## 2022-10-27 DIAGNOSIS — I69351 Hemiplegia and hemiparesis following cerebral infarction affecting right dominant side: Secondary | ICD-10-CM

## 2022-10-27 DIAGNOSIS — R2681 Unsteadiness on feet: Secondary | ICD-10-CM

## 2022-10-27 DIAGNOSIS — S43111D Subluxation of right acromioclavicular joint, subsequent encounter: Secondary | ICD-10-CM

## 2022-10-27 DIAGNOSIS — R2689 Other abnormalities of gait and mobility: Secondary | ICD-10-CM | POA: Diagnosis not present

## 2022-10-27 DIAGNOSIS — M6281 Muscle weakness (generalized): Secondary | ICD-10-CM

## 2022-10-27 NOTE — Therapy (Unsigned)
OUTPATIENT OCCUPATIONAL THERAPY NEURO TREATMENT  Patient Name: Jimmy Valentine MRN: [id number] DOB:September 12, 1950, 72 y.o., male Today's Date: 10/27/2022  PCP: Wanda Plump, MD REFERRING PROVIDER: Wanda Plump, MD  END OF SESSION:  OT End of Session - 10/27/22 1109     Visit Number 4    Number of Visits 16   + evaluaiton   Date for OT Re-Evaluation 12/11/22    Authorization Type [employer] MC 2024 Met VL: [place] Auth Not Reqd    OT Start Time 1107    OT Stop Time 1145    OT Time Calculation (min) 38 min    Equipment Utilized During Treatment --    Activity Tolerance Patient tolerated treatment well    Behavior During Therapy WFL for tasks assessed/performed             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,had a MRI, was prescribed testosterone by endocrinology   Sleep apnea    does't use cpap   Sudden hearing loss, left 2017   idiopathic sensorineural hearing loss   Past Surgical History:  Procedure Laterality Date   TOTAL KNEE ARTHROPLASTY Right 2005   TOTAL KNEE ARTHROPLASTY  12/04/2011   LEFT-- TOTAL KNEE ARTHROPLASTY;  Surgeon: Eugenia Mcalpine, MD;  Location: [facility];  Service: Orthopedics;  Laterality: Left;   Patient Active Problem List   Diagnosis Date Noted   Hemiparesis affecting right side as late effect of cerebrovascular accident (CVA) (HCC) 09/04/2022   Gait disturbance, post-stroke 09/04/2022   Hyponatremia 07/18/2022   Aphasia due to acute cerebrovascular accident (CVA) (HCC) 07/15/2022   Acute ischemic left MCA stroke (HCC) 06/24/2022   Stroke (cerebrum) (HCC) 06/21/2022   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 10/18/2014   Elevated LFTs 04/13/2014   Diabetes (HCC) 09/03/2011   Gout 09/03/2011   OSA (obstructive sleep apnea) 04/22/2011   Annual physical exam 12/04/2010   Hypertension    Hyperlipidemia    Hypogonadism male    Erectile dysfunction     ONSET DATE: Referral date: 10/01/2022  Stroke 06/21/22  REFERRING DIAG: I69.351 (ICD-10-CM) - Hemiplegia and hemiparesis following cerebral infarction affecting right dominant side (HCC)  THERAPY DIAG:   Muscle weakness (generalized)  Other lack of coordination  Hemiplegia and hemiparesis following cerebral infarction affecting right dominant side (HCC)  Subluxation of acromioclavicular joint, right, subsequent encounter  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  Pt accompanied by: self   PERTINENT HISTORY:  Presented to [facility] ED on 06/21/2022 with sudden onset of right-sided weakness, dysarthria and facial drooping. MRI showed large MCA territory basal ganglia infarct with petechial hemorrhage with no hemorrhagic transformation or mass effect.   Inpatient Rehab: 06/24/2022 - 07/30/2022 Patient discharged at [facility] Assist level.  Patient's care partners able to provide the necessary physical assistance at discharge.   Recommendation:  Patient will benefit from ongoing skilled OT services in  home health setting to continue to advance functional skills in the area of BADL, iADL, and Reduce care partner burden. Equipment: Shower chair, commode, RW, R RW hand splint, R resting hand splint, LH sponge, Dycem   Home Health therapies 07/24 - 08/24     PRECAUTIONS: Fall  WEIGHT BEARING RESTRICTIONS: No  PAIN:  Are you having pain? No  FALLS: Has patient fallen in last 6 months? Yes. Number of falls  1x - fell on his bottom about a month ago - got the walker stuck on the doorframe  LIVING ENVIRONMENT: Lives with: lives alone and Caregiver 3-4 days/week x 12 hours, daughter, son and brother-in-law assist on other days Lives in: House/apartment Stairs: Yes: External: 3-5 steps; can reach both Has following equipment at home: Environmental consultant - 2 wheeled, Wheelchair (manual), Shower bench, bed side commode, Grab bars, and urinal  PLOF: Independent - still driving, played golf, retired Immunologist with lots traveling (driving)  PATIENT GOALS: To get my right side back working again  OBJECTIVE:   HAND DOMINANCE: Right (affected side)  ADLs: Overall ADLs: Min to mod assistance Transfers/ambulation related to ADLs: Supervision Eating: Gets help to cut food as needed, having to use L hand Grooming: Used regular razor yesterday for the first time UB Dressing: Needs help to get R arm in sleeve LB Dressing: Can pull pants up most of the time Toileting: Most of the time he is able to perform on his own; uses urinal at bedside at night Bathing: "50/50" with caregiver Tub Shower transfers: Gets help to get his leg into the tub Equipment: Transfer tub bench, Grab bars, bed side commode, and Long handled sponge  IADLs: Shopping: caregiver and family (daughter) Light housekeeping: Caregiver Meal Prep: Psychologist, counselling mobility: RW with hand orthosis & dycem + strap for R hand Medication management: Assistance from caregivers Financial management: Assistance from family Handwriting: unable with R UE; not assessed with L UE  MOBILITY STATUS: Needs Assist: Help to get his R UE/hand on the handle of his walker and Hx of falls  POSTURE COMMENTS:  rounded shoulders and forward head Sitting balance:  WFL in arm chair at table  ACTIVITY TOLERANCE: Activity tolerance: Limited due to R hemiplegia  FUNCTIONAL OUTCOME MEASURES: PSFS - 1.0  UPPER EXTREMITY ROM:    Active ROM Right eval  Left eval  Shoulder flexion  [facility]  Shoulder abduction    Shoulder adduction    Shoulder extension min   Shoulder internal rotation    Shoulder external rotation    Elbow flexion min   Elbow extension    Wrist flexion min   Wrist extension    Wrist ulnar deviation    Wrist radial deviation    Wrist pronation    Wrist supination    (Blank rows = not tested)  UPPER EXTREMITY MMT:     MMT Right eval Left eval  Shoulder flexion  4/5  Shoulder abduction    Shoulder adduction    Shoulder extension    Shoulder internal rotation    Shoulder external rotation    Middle trapezius    Lower trapezius    Elbow flexion    Elbow extension    Wrist flexion    Wrist extension    Wrist ulnar deviation    Wrist radial deviation    Wrist pronation    Wrist supination    (Blank rows = not tested)  HAND FUNCTION: Grip strength: Right: 3.9, 3.7, 5.2 lbs; Left: 34.8, 36.5, 37.9 lbs  Average: Right 4.3 lbs Left 36.4 lbs  COORDINATION: Unable to perform R UE coordinaiton tasks,   SENSATION: WFL  EDEMA: Slight RUE  MUSCLE TONE: RUE: Hypotonic and Flaccid  COGNITION: Overall cognitive status: Within functional limits for tasks assessed and h/o more severe aphasia and does self-report need to think about what he is trying to say etc  VISION: Subjective report: Eye Dr ~ 3 weeks Baseline vision: Wears glasses for reading only Visual history: cataracts  VISION ASSESSMENT: TBA  Patient is not having difficulty with activities due to vision.  PERCEPTION: Not tested  PRAXIS: Not tested  OBSERVATIONS: Pt ambulated into clinic w/RW and R hand orthosis. Patient was wearing his AFO which he stated he stopped wearing after he finished home health therapy.   TODAY'S TREATMENT:                                                                                                                               Reviewed HEP from previous session adding pictures AROM and AAROM for RUE ROM.   OT  had pt complete RUE pushes at table top to move large pegs and cubes to targets for improved ROM and functional use.   OT assessed muscle contraction at R biceps and extensor wad to determine appropriateness of Myopro following CVA.   PATIENT EDUCATION: Education details: AROM/AAROM RUE  Person educated: Patient Education method: Explanation, Demonstration, Tactile cues, Verbal cues, and Handouts Education comprehension: verbalized understanding, returned demonstration, verbal cues required, tactile cues required, and needs further education  HOME EXERCISE PROGRAM: 10/20/22 - UE ROM (grip, forearm, elbow flex, shoulder shrugs) - no images provided only written suggestions. 10/27/2022: updated ROM HEP Access Code [email or web address]  GOALS: Goals reviewed with patient? Yes  SHORT TERM GOALS: Target date: 11/13/22  Patient will demonstrate updated RUE HEP with 25% verbal cues or less for proper execution.  Baseline: New to outpatient OT.  Self reported difficulty with motivation at home Goal status: IN progress  2.  Patient will demonstrate at least 8+ lbs RUE grip strength as needed to stabilize container on tabletop/hold large handled object.  Baseline: Right 4.3 lbs Goal status: IN progress  3.  Patient will be able to don T-shirt without physical assistance x 5 days in a row. Baseline: Caregiver assistance to get R UE in sleeve Goal status: INITIAL  4.  Patient will improve R UE AROM for shoulder elevation and elbow flexion to lift arm/hand to walker.  Baseline: Caregiver assist or uses LUE.  Trace elbow/shoulder movement Goal status: IN Progress  5.  Patient will be assisted to don/doff appropriate splints/positioning devices to minimize subluxation and minimize risk of RUE/hand contracture. Baseline: None place Goal status: IN Progress   LONG TERM GOALS: Target date: 12/11/22  Patient will demonstrate updated RUE HEP with visual schedule and images for proper execution and  frequency. Baseline: New to outpatient OT.  Self reported difficulty with motivation at home. Goal status:  IN Progress  2.  Patient will demonstrate at least 10 lbs RUE grip strength as needed to hold objects for manipulation by LUE. Baseline: Right 4.3 lbs Goal status: IN Progress  3.  Patient will be able to get to EOB with no physical assistance to toilet with BSC as needed. Baseline: Assistance to get to EOB from caregivers. Goal status: INITIAL  4.  Patient will be able to prepare simple food items with appropriate AE (small crock pot, microwave, toaster oven etc) Baseline: Caregiver/family perform meal prep  Goal status: INITIAL  5.  Patient will report at least two-point increase in average PSFS score or at least three-point increase in a single activity score indicating functionally significant improvement given minimum detectable change. Baseline: 1.0 total score (See above for individual activity scores)  Goal status: INITIAL  ASSESSMENT:  CLINICAL IMPRESSION: Patient demonstrates good understanding of HEP as needed to progress towards goals. Pt demonstrates sufficient involvement of RUE as needed to be Myopro candidate.   PERFORMANCE DEFICITS: in functional skills including ADLs, coordination, dexterity, proprioception, sensation, tone, ROM, strength, flexibility, Fine motor control, Gross motor control, mobility, balance, endurance, decreased knowledge of use of DME, skin integrity, and UE functional use, cognitive skills including energy/drive, safety awareness, and sequencing, and psychosocial skills including coping strategies, environmental adaptation, and routines and behaviors.   IMPAIRMENTS: are limiting patient from ADLs, IADLs, rest and sleep, leisure, and social participation.   CO-MORBIDITIES: may have co-morbidities  that affects occupational performance. Patient will benefit from skilled OT to address above impairments and improve overall function.  REHAB  POTENTIAL: Good   PLAN:  OT FREQUENCY: 1-2x/week  OT DURATION: 8 weeks  PLANNED INTERVENTIONS: self care/ADL training, therapeutic exercise, therapeutic activity, neuromuscular re-education, manual therapy, passive range of motion, balance training, functional mobility training, aquatic therapy, splinting, electrical stimulation, patient/family education, cognitive remediation/compensation, visual/perceptual remediation/compensation, energy conservation, coping strategies training, and DME and/or AE instructions  RECOMMENDED OTHER SERVICES: PT evaluation already completed.  CONSULTED AND AGREED WITH PLAN OF CARE: Patient and family member/caregiver  PLAN FOR NEXT SESSION:   E-stim protocols for R UE AAROM Splinting needs - hand splint  Consideration for Neuro Hand Glove options [employer] option (online visual video watched 10/20/22) Myomo appropriate  Delana Meyer, OT 10/27/2022, 5:52 PM

## 2022-10-27 NOTE — Therapy (Signed)
OUTPATIENT PHYSICAL THERAPY NEURO TREATMENT   Patient Name: Jimmy Valentine MRN: [id number] DOB:05-14-1950, 72 y.o., male Today's Date: 10/27/2022   PCP: Wanda Plump, MD REFERRING PROVIDER: Wanda Plump, MD  END OF SESSION:  PT End of Session - 10/27/22 1152     Visit Number 4    Number of Visits 17   Plus eval   Date for PT Re-Evaluation 12/09/22    Authorization Type [employer]    PT Start Time 1150   from OT session   PT Stop Time 1235    PT Time Calculation (min) 45 min    Equipment Utilized During Treatment Gait belt;Other (comment)   Bioness L300 thigh and lower leg cuff   Activity Tolerance Patient tolerated treatment well    Behavior During Therapy WFL for tasks assessed/performed                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,had a MRI, was prescribed testosterone by endocrinology   Sleep apnea    does't use cpap   Sudden hearing loss, left 2017   idiopathic sensorineural hearing loss   Past Surgical History:  Procedure Laterality Date   TOTAL KNEE ARTHROPLASTY Right 2005   TOTAL KNEE ARTHROPLASTY  12/04/2011   LEFT-- TOTAL KNEE ARTHROPLASTY;  Surgeon: Eugenia Mcalpine, MD;  Location: [facility];  Service: Orthopedics;  Laterality: Left;   Patient Active Problem List   Diagnosis Date Noted   Hemiparesis affecting right side as late effect of cerebrovascular accident (CVA) (HCC) 09/04/2022   Gait disturbance, post-stroke 09/04/2022   Hyponatremia 07/18/2022   Aphasia due to acute cerebrovascular accident (CVA) (HCC) 07/15/2022   Acute ischemic left MCA stroke (HCC) 06/24/2022   Stroke (cerebrum) (HCC) 06/21/2022   Chronic mycotic otitis externa 10/11/2019   Conductive hearing loss of left ear 01/05/2019   Sensorineural  hearing loss (SNHL), bilateral 09/24/2016    PCP NOTES >>>>>>>>>>>>>>>>>>. 10/18/2014   Elevated LFTs 04/13/2014   Diabetes (HCC) 09/03/2011   Gout 09/03/2011   OSA (obstructive sleep apnea) 04/22/2011   Annual physical exam 12/04/2010   Hypertension    Hyperlipidemia    Hypogonadism male    Erectile dysfunction     ONSET DATE: 09/23/2022 (referral)   REFERRING DIAG: L87.564 (ICD-10-CM) - Hemiparesis affecting right side as late effect of cerebrovascular accident (CVA) (HCC)  THERAPY DIAG:  Muscle weakness (generalized)  Unsteadiness on feet  Other abnormalities of gait and mobility  Rationale for Evaluation and Treatment: Rehabilitation  SUBJECTIVE:  SUBJECTIVE STATEMENT: Pt denies any acute changes since last visit, no complaints of pain today.  Pt accompanied by:  Caregiver, [name] (in lobby)   PERTINENT HISTORY: L MCA CVA in May 2024   PAIN:  Are you having pain? No  PRECAUTIONS: Fall  WEIGHT BEARING RESTRICTIONS: No  FALLS: Has patient fallen in last 6 months? Yes. Number of falls 1  LIVING ENVIRONMENT: Lives with: lives alone Lives in: House/apartment Stairs: Yes: External: 3 steps; on right going up, on left going up, and can reach both Has following equipment at home: Walker - 2 wheeled, Wheelchair (manual), shower chair, bed side commode, and AFO  PLOF: Independent  PATIENT GOALS: "Get back to where I can walk by myself without the walker and without assistance"   OBJECTIVE:   DIAGNOSTIC FINDINGS: MRI of brain on 06/22/22  IMPRESSION: 1. Positive for Left MCA territory lacunar type infarct tracking from the left corona radiata to the lentiform. Petechial hemorrhage (Heidelberg classification 1b: HI2). No malignant hemorrhagic transformation. No significant mass  effect.   2. No other acute intracranial abnormality. 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  CT angio of head/neck on 06/21/22 IMPRESSION: 1. 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 The left MCA bi/trifurcation and left M2 branches remain patent.   2. Superimposed Severe intracranial and cervical ICA Atherosclerosis resulting in: - distal Right Vertebral Artery (V4) OCCLUSION. - distal Left Vertebral Artery Severe (V4) stenosis. - Right ICA origin RADIOGRAPHIC STRING SIGN stenosis due to calcified plaque. - Left ICA origin stenosis due to complex plaque estimated 65-70%. - Severe Left ICA supraclinoid stenosis due to calcified plaque.  COGNITION: Overall cognitive status: Within functional limits for tasks assessed   SENSATION: Pt denies numbness/tingling in RLE/RUE   COORDINATION: Heel to shin test: WNL on LLE, unable to perform hip flexion on RLE but able to achieve cross-legged position    MUSCLE TONE: Mild clonus in R foot that fatigues after 3s   POSTURE: rounded shoulders and forward head   LOWER EXTREMITY MMT:  Tested in seated position   MMT Right Eval Left Eval  Hip flexion 2- 4+  Hip extension    Hip abduction 1 4+  Hip adduction 2 4+  Hip internal rotation    Hip external rotation    Knee flexion 3 4+  Knee extension 2 4+  Ankle dorsiflexion 4- 4+  Ankle plantarflexion    Ankle inversion    Ankle eversion    (Blank rows = not tested)  BED MOBILITY:  Has a rail but requires assistance (up to max) to roll and perform sit <>supine   TRANSFERS: Assistive device utilized: Environmental consultant - 2 wheeled  Sit to stand: SBA Stand to sit: SBA Pt kickstands RLE and leans to L side to perform    GAIT: Gait pattern: step to  pattern, decreased step length- Right, decreased stride length, decreased hip/knee flexion- Right, decreased ankle dorsiflexion- Right, Left hip hike, lateral lean- Left, and poor foot clearance- Right Distance walked: Various clinic distances  Assistive device utilized: Walker - 2 wheeled Level of assistance: CGA and Min A Comments: Pt not wearing AFO this date. Noted pt dragging RLE on ground, especially w/fatigue, and would lose balance anteriorly requiring min A to stabilize. Pt also vaults to progress RLE and frequently tips  RW over on L side.   VITALS There were no vitals filed for this visit.     TODAY'S TREATMENT:        NMR To encourage increased weight shift to the R and increased WB through RLE: Staggered sit to stand with 2" step under LLE x 10 reps Staggered sit to stand x 5 reps Encouraged pt to perform staggered sit to stands and home and focus on increased WB through RLE during all transfers  E-stim attended  Bioness set up to R anterior tibialis and hamstrings for improved ankle DF and facilitation of knee flexion w/gait. See tablet 1 for details.   Gait Training  Gait pattern: step through pattern, decreased hip/knee flexion- Right, decreased ankle dorsiflexion- Right, circumduction- Right, lateral lean- Left, and poor foot clearance- Right Distance walked: 345 ft Assistive device utilized: Walker - 2 wheeled Level of assistance: CGA Comments: with Bioness donned to R hamstrings and anterior tibialis (no AFO); exhibits improved limb clearance with use of Bioness FES; does have onset of decreased DF with onset of fatigue as well as increase in L lateral lean with onset of fatigue                                                                                                                      PATIENT EDUCATION: Education details: work on staggered sit to stands at home, plan to assess STG next session Person educated: Patient Education method: Software engineer Education comprehension: verbalized understanding  HOME EXERCISE PROGRAM: To be established   GOALS: Goals reviewed with patient? Yes  SHORT TERM GOALS: Target date: 10/28/2022   Pt will perform initial HEP w/min A from caregiver for improved strength, balance, transfers and gait.  Baseline: to be established  Goal status: INITIAL  2.  Pt will improve gait velocity to at least 0.7 ft/s w/RW and CGA for improved gait efficiency and independence   Baseline: 0.49 ft/s w/RW and min A Goal status: INITIAL  3.  Pt will be able to don and doff R hand orthotic on RW w/CGA for improved independence and weight shift to R side  Baseline: max A  Goal status: INITIAL  4.  Pt will ambulate greater than or equal to 300 feet on with LRAD and CGA for improved cardiovascular endurance and BLE strength.    Baseline: 213 ft with RW and min A (9/17) Goal status: INITIAL  5.  Berg to be assessed and LTG written  Baseline: 25/56 Goal status: MET  6.  Pt will perform supine > sit EOB w/min A or use of rail for improved independence and functional strength Baseline: mod-max A Goal status: INITIAL  LONG TERM GOALS: Target date: 11/25/2022   Pt will be independent with final HEP for improved strength, balance, transfers and gait.  Baseline:  Goal status: INITIAL  2.  Pt will improve gait velocity to at least 1.0 ft/s w/LRAD and SBA for improved gait efficiency and independence   Baseline:  Goal status: INITIAL  3.  Pt will ambulate greater than or equal to 400 feet on with LRAD and CGA for improved cardiovascular endurance and BLE strength.   Baseline: 213 ft with RW and min A (9/17)  Goal status: INITIAL  4.  Pt will improve Berg score to 34/56 for decreased fall risk  Baseline: 25/56 Goal status: REVISED  5.  Pt will perform bed mobility at CGA or SBA level for improved independence at home  Baseline: mod-max A Goal status:  INITIAL   ASSESSMENT:  CLINICAL IMPRESSION: Emphasis of skilled PT session on trialing Bioness to R hamstrings and R ant tib during gait. Pt does exhibit improved RLE clearance with gait with use of Bioness this session, does have onset of decreased R limb clearance with onset of fatigue. Pt also continues to exhibit decreased weight shift to the R during transfers and during gait, benefits from blocked practice of staggered sit to stands. Pt continues to benefit from skilled therapy services to work on R hemibody strengthening in order to improve his balance and increase his safety and independence with functional mobility. Continue POC.    OBJECTIVE IMPAIRMENTS: Abnormal gait, decreased activity tolerance, decreased balance, decreased coordination, decreased mobility, difficulty walking, decreased strength, impaired tone, impaired UE functional use, improper body mechanics, and pain.   ACTIVITY LIMITATIONS: carrying, lifting, standing, squatting, stairs, transfers, bed mobility, bathing, toileting, dressing, self feeding, reach over head, hygiene/grooming, locomotion level, and caring for others  PARTICIPATION LIMITATIONS: meal prep, cleaning, laundry, medication management, personal finances, interpersonal relationship, driving, shopping, community activity, and yard work  PERSONAL FACTORS: Fitness, Past/current experiences, Transportation, and 1 comorbidity: CVA  are also affecting patient's functional outcome.   REHAB POTENTIAL: Good  CLINICAL DECISION MAKING: Evolving/moderate complexity  EVALUATION COMPLEXITY: Moderate  PLAN:  PT FREQUENCY: 2x/week  PT DURATION: 8 weeks (POC written for 10 weeks due to delay in scheduling)   PLANNED INTERVENTIONS: Therapeutic exercises, Therapeutic activity, Neuromuscular re-education, Balance training, Gait training, Patient/Family education, Self Care, Joint mobilization, Stair training, Orthotic/Fit training, DME instructions, Aquatic Therapy,  Dry Needling, Electrical stimulation, Manual therapy, and Re-evaluation  PLAN FOR NEXT SESSION: assess STGs, continue with Bioness to R HS and ant tib; RLE NMR, weight shift to R side, work on bed mobility and donning/doffing RUE from hand orthotic and donning/doffing AFO more independently    Peter Congo, PT, DPT, [facility] [address] [place], [place]  [address] Phone:  [phone] Fax:  [phone]  10/27/2022, 12:36 PM

## 2022-10-29 ENCOUNTER — Ambulatory Visit: Payer: Medicare HMO | Admitting: Occupational Therapy

## 2022-10-29 ENCOUNTER — Ambulatory Visit: Payer: Medicare HMO | Admitting: Physical Therapy

## 2022-10-29 VITALS — BP 132/78 | HR 94

## 2022-10-29 DIAGNOSIS — S43111D Subluxation of right acromioclavicular joint, subsequent encounter: Secondary | ICD-10-CM

## 2022-10-29 DIAGNOSIS — I69351 Hemiplegia and hemiparesis following cerebral infarction affecting right dominant side: Secondary | ICD-10-CM

## 2022-10-29 DIAGNOSIS — M6281 Muscle weakness (generalized): Secondary | ICD-10-CM

## 2022-10-29 DIAGNOSIS — R2689 Other abnormalities of gait and mobility: Secondary | ICD-10-CM | POA: Diagnosis not present

## 2022-10-29 DIAGNOSIS — R278 Other lack of coordination: Secondary | ICD-10-CM

## 2022-10-29 DIAGNOSIS — R2681 Unsteadiness on feet: Secondary | ICD-10-CM

## 2022-10-29 DIAGNOSIS — I63512 Cerebral infarction due to unspecified occlusion or stenosis of left middle cerebral artery: Secondary | ICD-10-CM | POA: Diagnosis not present

## 2022-10-29 NOTE — Therapy (Signed)
OUTPATIENT OCCUPATIONAL THERAPY NEURO TREATMENT  Patient Name: [name] MRN: [id number] DOB:1950/10/28, 72 y.o., male Today's Date: 10/29/2022  PCP: Wanda Plump, MD REFERRING PROVIDER: Wanda Plump, MD  END OF SESSION:  OT End of Session - 10/29/22 1016     Visit Number 5    Number of Visits 16   + evaluaiton   Date for OT Re-Evaluation 12/11/22    Authorization Type [employer] MC 2024 Met VL: [place] Auth Not Reqd    OT Start Time 1017    OT Stop Time 1100    OT Time Calculation (min) 43 min    Equipment Utilized During Treatment blocks, cones and pillowcase    Activity Tolerance Patient tolerated treatment well    Behavior During Therapy WFL for tasks assessed/performed             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,had a MRI, was prescribed testosterone by endocrinology   Sleep apnea    does't use cpap   Sudden hearing loss, left 2017   idiopathic sensorineural hearing loss   Past Surgical History:  Procedure Laterality Date   TOTAL KNEE ARTHROPLASTY Right 2005   TOTAL KNEE ARTHROPLASTY  12/04/2011   LEFT-- TOTAL KNEE ARTHROPLASTY;  Surgeon: Eugenia Mcalpine, MD;  Location: [facility];  Service: Orthopedics;  Laterality: Left;   Patient Active Problem List   Diagnosis Date Noted   Hemiparesis affecting right side as late effect of cerebrovascular accident (CVA) (HCC) 09/04/2022   Gait disturbance, post-stroke 09/04/2022   Hyponatremia 07/18/2022   Aphasia due to acute cerebrovascular accident (CVA) (HCC) 07/15/2022   Acute ischemic left MCA stroke (HCC) 06/24/2022   Stroke (cerebrum) (HCC) 06/21/2022   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 10/18/2014   Elevated LFTs 04/13/2014   Diabetes (HCC) 09/03/2011   Gout 09/03/2011   OSA (obstructive sleep apnea) 04/22/2011   Annual physical exam 12/04/2010   Hypertension    Hyperlipidemia    Hypogonadism male    Erectile dysfunction     ONSET DATE: Referral date: 10/01/2022  Stroke 06/21/22  REFERRING DIAG: I69.351 (ICD-10-CM) - Hemiplegia and hemiparesis following cerebral infarction affecting right dominant side (HCC)  THERAPY DIAG:   Muscle weakness (generalized)  Hemiplegia and hemiparesis following cerebral infarction affecting right dominant side (HCC)  Other lack of coordination  Subluxation of acromioclavicular joint, right, subsequent encounter  Rationale for Evaluation and Treatment: Rehabilitation  SUBJECTIVE:   SUBJECTIVE STATEMENT:  He was a little sore after Tuesday but it was better when he got home.  He also reported that he is able to hold his walker handle a little better on his own ie) without the strap over his hand. He has worn his splint 2-4 hours in the day and may even where it into the night but not usually all night.  Pt accompanied by: self   PERTINENT HISTORY:  Presented to [facility] ED on 06/21/2022 with sudden onset of right-sided weakness, dysarthria and facial drooping. MRI showed large MCA territory basal ganglia infarct with petechial hemorrhage with no hemorrhagic transformation or mass effect.  Inpatient Rehab: 06/24/2022 - 07/30/2022 Patient discharged at [facility] Assist level.  Patient's care partners able to provide the necessary physical assistance at discharge.   Recommendation:  Patient will benefit from ongoing skilled OT services in home health setting to continue to advance functional skills in the area of BADL, iADL, and Reduce care partner burden. Equipment: Shower chair, commode, RW, R RW hand splint, R resting hand splint, LH sponge, Dycem   Home Health therapies 07/24 - 08/24      PRECAUTIONS: Fall  WEIGHT BEARING RESTRICTIONS: No  PAIN:  Are you having pain? No  FALLS: Has patient fallen in last 6 months? Yes. Number of falls 1x - fell on his bottom about a month ago - got the walker stuck on the doorframe  LIVING ENVIRONMENT: Lives with: lives alone and Caregiver 3-4 days/week x 12 hours, daughter, son and brother-in-law assist on other days Lives in: House/apartment Stairs: Yes: External: 3-5 steps; can reach both Has following equipment at home: Environmental consultant - 2 wheeled, Wheelchair (manual), Shower bench, bed side commode, Grab bars, and urinal  PLOF: Independent - still driving, played golf, retired Immunologist with lots traveling (driving)  PATIENT GOALS: To get my right side back working again  OBJECTIVE:   HAND DOMINANCE: Right (affected side)  ADLs: Overall ADLs: Min to mod assistance Transfers/ambulation related to ADLs: Supervision Eating: Gets help to cut food as needed, having to use L hand Grooming: Used regular razor yesterday for the first time UB Dressing: Needs help to get R arm in sleeve LB Dressing: Can pull pants up most of the time Toileting: Most of the time he is able to perform on his own; uses urinal at bedside at night Bathing: "50/50" with caregiver Tub Shower transfers: Gets help to get his leg into the tub Equipment: Transfer tub bench, Grab bars, bed side commode, and Long handled sponge  IADLs: Shopping: caregiver and family (daughter) Light housekeeping: Caregiver Meal Prep: Psychologist, counselling mobility: RW with hand orthosis & dycem + strap for R hand Medication management: Assistance from caregivers Financial management: Assistance from family Handwriting: unable with R UE; not assessed with L UE  MOBILITY STATUS: Needs Assist: Help to get his R UE/hand on the handle of his walker and Hx of falls  POSTURE COMMENTS:  rounded shoulders and forward head Sitting balance:  WFL in arm chair at  table  ACTIVITY TOLERANCE: Activity tolerance: Limited due to R hemiplegia  FUNCTIONAL OUTCOME MEASURES: PSFS - 1.0  UPPER EXTREMITY ROM:    Active ROM Right eval Left eval  Shoulder flexion  [facility]  Shoulder abduction    Shoulder adduction    Shoulder extension min   Shoulder internal rotation    Shoulder external rotation    Elbow flexion min   Elbow extension    Wrist flexion min   Wrist extension    Wrist ulnar deviation    Wrist radial deviation    Wrist pronation    Wrist supination    (Blank rows = not tested)  UPPER EXTREMITY MMT:     MMT Right eval Left eval  Shoulder flexion  4/5  Shoulder abduction    Shoulder adduction    Shoulder extension    Shoulder internal rotation    Shoulder external rotation    Middle trapezius    Lower trapezius    Elbow flexion    Elbow extension    Wrist flexion    Wrist extension    Wrist ulnar deviation  Wrist radial deviation    Wrist pronation    Wrist supination    (Blank rows = not tested)  HAND FUNCTION: Grip strength: Right: 3.9, 3.7, 5.2 lbs; Left: 34.8, 36.5, 37.9 lbs Average: Right 4.3 lbs Left 36.4 lbs  COORDINATION: Unable to perform R UE coordinaiton tasks,   SENSATION: WFL  EDEMA: Slight RUE  MUSCLE TONE: RUE: Hypotonic and Flaccid  COGNITION: Overall cognitive status: Within functional limits for tasks assessed and h/o more severe aphasia and does self-report need to think about what he is trying to say etc  VISION: Subjective report: Eye Dr ~ 3 weeks Baseline vision: Wears glasses for reading only Visual history: cataracts  VISION ASSESSMENT: TBA  Patient is not having difficulty with activities due to vision.  PERCEPTION: Not tested  PRAXIS: Not tested  OBSERVATIONS: Pt ambulated into clinic w/RW and R hand orthosis. Patient was wearing his AFO which he stated he stopped wearing after he finished home health therapy.   TODAY'S TREATMENT:                                                                                                                                 Neuro Re-ed: Neuromuscular reeducation continuing to include specific visual, tactile and verbal cues and assistance to stimulate the neuromuscular system and promote functional movement in RUE.  Reviewed and practiced motions he can perform with R UE support on pillow case to slide on table top to move cones and blocks.   He can pull cones and blocks off the table via elbow flexion and internal rotation.  He has difficulty extending back out across the table but can knock a cone off behind his elbow.     OTR ensured RUE/shoulder is supported to minimize subluxation and continues to combine previous motions  to work on picking up 1" cubes and releasing them into a container.  After initial physical guidance from OT to lift and direct his arm/hand to the blocks, pt was able to carryover motion on his own via assistance from LUE.     Also worked on UE motions needed to help push himself up to sitting in the bed by getting to his L elbow and pushing through his hand.   PATIENT EDUCATION: Education details: AROM/AAROM RUE  Person educated: Patient Education method: Explanation, Demonstration, Tactile cues, and Verbal cues Education comprehension: verbalized understanding, returned demonstration, verbal cues required, tactile cues required, and needs further education  HOME EXERCISE PROGRAM: 10/20/22 - UE ROM (grip, forearm, elbow flex, shoulder shrugs) - no images provided only written suggestions. 10/27/2022: updated ROM HEP Access Code [email or web address]  GOALS: Goals reviewed with patient? Yes  SHORT TERM GOALS: Target date: 11/13/22  Patient will demonstrate updated RUE HEP with 25% verbal cues or less for proper execution.  Baseline: New to outpatient OT.  Self reported difficulty with motivation at home Goal status: IN progress  2.  Patient will demonstrate at least 8+  lbs RUE grip strength as needed to  stabilize container on tabletop/hold large handled object.  Baseline: Right 4.3 lbs Goal status: IN progress  3.  Patient will be able to don T-shirt without physical assistance x 5 days in a row. Baseline: Caregiver assistance to get R UE in sleeve Goal status: INITIAL  4.  Patient will improve R UE AROM for shoulder elevation and elbow flexion to lift arm/hand to walker.  Baseline: Caregiver assist or uses LUE.  Trace elbow/shoulder movement Goal status: IN Progress  5.  Patient will be assisted to don/doff appropriate splints/positioning devices to minimize subluxation and minimize risk of RUE/hand contracture. Baseline: None place Goal status: IN Progress   LONG TERM GOALS: Target date: 12/11/22  Patient will demonstrate updated RUE HEP with visual schedule and images for proper execution and frequency. Baseline: New to outpatient OT.  Self reported difficulty with motivation at home. Goal status: IN Progress  2.  Patient will demonstrate at least 10 lbs RUE grip strength as needed to hold objects for manipulation by LUE. Baseline: Right 4.3 lbs Goal status: IN Progress  3.  Patient will be able to get to EOB with no physical assistance to toilet with BSC as needed. Baseline: Assistance to get to EOB from caregivers. Goal status: IN Progress  4.  Patient will be able to prepare simple food items with appropriate AE (small crock pot, microwave, toaster oven etc) Baseline: Caregiver/family perform meal prep  Goal status: INITIAL  5.  Patient will report at least two-point increase in average PSFS score or at least three-point increase in a single activity score indicating functionally significant improvement given minimum detectable change. Baseline: 1.0 total score (See above for individual activity scores)  Goal status: INITIAL   ASSESSMENT:  CLINICAL IMPRESSION: Patient is a 72 y.o. male who was seen today for occupational therapy treatment for left MCA stroke with RUE  weakness/hemiparesis. Pt responded well to neuro re-ed for R UE A/AROM activities involved to pick up and release blocks today.  Pt will benefit from trials of NMES for R UE to improve neuromuscular return and functional use of RUE.  He will benefit from continued skilled OT services in the outpatient setting to work on RUE impairments to help pt return to higher independence, safety and as modified independence as able.      PERFORMANCE DEFICITS: in functional skills including ADLs, coordination, dexterity, proprioception, sensation, tone, ROM, strength, flexibility, Fine motor control, Gross motor control, mobility, balance, endurance, decreased knowledge of use of DME, skin integrity, and UE functional use, cognitive skills including energy/drive, safety awareness, and sequencing, and psychosocial skills including coping strategies, environmental adaptation, and routines and behaviors.   IMPAIRMENTS: are limiting patient from ADLs, IADLs, rest and sleep, leisure, and social participation.   CO-MORBIDITIES: may have co-morbidities  that affects occupational performance. Patient will benefit from skilled OT to address above impairments and improve overall function.  REHAB POTENTIAL: Good   PLAN:  OT FREQUENCY: 1-2x/week  OT DURATION: 8 weeks  PLANNED INTERVENTIONS: self care/ADL training, therapeutic exercise, therapeutic activity, neuromuscular re-education, manual therapy, passive range of motion, balance training, functional mobility training, aquatic therapy, splinting, electrical stimulation, patient/family education, cognitive remediation/compensation, visual/perceptual remediation/compensation, energy conservation, coping strategies training, and DME and/or AE instructions  RECOMMENDED OTHER SERVICES: PT evaluation already completed.  CONSULTED AND AGREED WITH PLAN OF CARE: Patient and family member/caregiver  PLAN FOR NEXT SESSION:   E-stim protocols for R UE AAROM Splinting needs  - hand splint  Consideration for Neuro Hand Glove options Mellissa Kohut v. [employer] option (online visual video watched 10/20/22) Myomo v. [employer]   ADL dressing trials and IADLS (cooking)   Victorino Sparrow, OT 10/29/2022, 2:35 PM

## 2022-10-29 NOTE — Therapy (Signed)
OUTPATIENT PHYSICAL THERAPY NEURO TREATMENT   Patient Name: Jimmy Valentine MRN: [id number] DOB:December 09, 1950, 72 y.o., male Today's Date: 10/29/2022   PCP: Wanda Plump, MD REFERRING PROVIDER: Wanda Plump, MD  END OF SESSION:  PT End of Session - 10/29/22 1100     Visit Number 5    Number of Visits 17   Plus eval   Date for PT Re-Evaluation 12/09/22    Authorization Type [employer]    PT Start Time 1100    PT Stop Time 1144    PT Time Calculation (min) 44 min    Equipment Utilized During Treatment Gait belt   AFO   Activity Tolerance Patient tolerated treatment well    Behavior During Therapy WFL for tasks assessed/performed                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,had a MRI, was prescribed testosterone by endocrinology   Sleep apnea    does't use cpap   Sudden hearing loss, left 2017   idiopathic sensorineural hearing loss   Past Surgical History:  Procedure Laterality Date   TOTAL KNEE ARTHROPLASTY Right 2005   TOTAL KNEE ARTHROPLASTY  12/04/2011   LEFT-- TOTAL KNEE ARTHROPLASTY;  Surgeon: Eugenia Mcalpine, MD;  Location: [facility];  Service: Orthopedics;  Laterality: Left;   Patient Active Problem List   Diagnosis Date Noted   Hemiparesis affecting right side as late effect of cerebrovascular accident (CVA) (HCC) 09/04/2022   Gait disturbance, post-stroke 09/04/2022   Hyponatremia 07/18/2022   Aphasia due to acute cerebrovascular accident (CVA) (HCC) 07/15/2022   Acute ischemic left MCA stroke (HCC) 06/24/2022   Stroke (cerebrum) (HCC) 06/21/2022   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 10/18/2014   Elevated LFTs 04/13/2014   Diabetes (HCC) 09/03/2011   Gout 09/03/2011   OSA (obstructive sleep apnea) 04/22/2011   Annual physical exam 12/04/2010   Hypertension    Hyperlipidemia    Hypogonadism male    Erectile dysfunction     ONSET DATE: 09/23/2022 (referral)   REFERRING DIAG: N82.956 (ICD-10-CM) - Hemiparesis affecting right side as late effect of cerebrovascular accident (CVA) (HCC)  THERAPY DIAG:  Muscle weakness (generalized)  Other lack of coordination  Hemiplegia and hemiparesis following cerebral infarction affecting right dominant side (HCC)  Unsteadiness on feet  Rationale for Evaluation and Treatment: Rehabilitation  SUBJECTIVE:  SUBJECTIVE STATEMENT: Pt denies any acute changes since last visit, no complaints of pain today. Has not been wearing his AFO at home, just to come to therapy. Has been working on walking short distances at home without assistance and is now able to flush the toilet on his own.   Pt accompanied by:  Caregiver, [name] (in lobby)   PERTINENT HISTORY: L MCA CVA in May 2024   PAIN:  Are you having pain? No  PRECAUTIONS: Fall  WEIGHT BEARING RESTRICTIONS: No  FALLS: Has patient fallen in last 6 months? Yes. Number of falls 1  LIVING ENVIRONMENT: Lives with: lives alone Lives in: House/apartment Stairs: Yes: External: 3 steps; on right going up, on left going up, and can reach both Has following equipment at home: Walker - 2 wheeled, Wheelchair (manual), shower chair, bed side commode, and AFO  PLOF: Independent  PATIENT GOALS: "Get back to where I can walk by myself without the walker and without assistance"   OBJECTIVE:   DIAGNOSTIC FINDINGS: MRI of brain on 06/22/22  IMPRESSION: 1. Positive for Left MCA  territory lacunar type infarct tracking from the left corona radiata to the lentiform. Petechial hemorrhage (Heidelberg classification 1b: HI2). No malignant hemorrhagic transformation. No significant mass effect.   2. No other acute intracranial abnormality. 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  CT angio of head/neck on 06/21/22 IMPRESSION: 1. 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 The left MCA bi/trifurcation and left M2 branches remain patent.   2. Superimposed Severe intracranial and cervical ICA Atherosclerosis resulting in: - distal Right Vertebral Artery (V4) OCCLUSION. - distal Left Vertebral Artery Severe (V4) stenosis. - Right ICA origin RADIOGRAPHIC STRING SIGN stenosis due to calcified plaque. - Left ICA origin stenosis due to complex plaque estimated 65-70%. - Severe Left ICA supraclinoid stenosis due to calcified plaque.  COGNITION: Overall cognitive status: Within functional limits for tasks assessed   SENSATION: Pt denies numbness/tingling in RLE/RUE   COORDINATION: Heel to shin test: WNL on LLE, unable to perform hip flexion on RLE but able to achieve cross-legged position    MUSCLE TONE: Mild clonus in R foot that fatigues after 3s   POSTURE: rounded shoulders and forward head   LOWER EXTREMITY MMT:  Tested in seated position   MMT Right Eval Left Eval  Hip flexion 2- 4+  Hip extension    Hip abduction 1 4+  Hip adduction 2 4+  Hip internal rotation    Hip external rotation    Knee flexion 3 4+  Knee extension 2 4+  Ankle dorsiflexion 4- 4+  Ankle plantarflexion    Ankle inversion    Ankle eversion    (Blank rows = not tested)  BED MOBILITY:  Has a rail but requires assistance (up to max) to roll and  perform sit <>supine   TRANSFERS: Assistive device utilized: Environmental consultant - 2 wheeled  Sit to stand: SBA Stand to sit: SBA Pt kickstands RLE and leans to L side to perform    GAIT: Gait pattern: step to pattern, decreased step length- Right, decreased stride length, decreased hip/knee flexion- Right, decreased ankle dorsiflexion- Right, Left hip hike, lateral lean- Left, and poor foot clearance- Right Distance walked: Various clinic distances  Assistive device utilized: Walker - 2 wheeled Level of assistance: CGA and  Min A Comments: Pt not wearing AFO this date. Noted pt dragging RLE on ground, especially w/fatigue, and would lose balance anteriorly requiring min A to stabilize. Pt also vaults to progress RLE and frequently tips RW over on L side.   VITALS Vitals:   10/29/22 1108 10/29/22 1113  BP: (!) 142/85 132/78  Pulse: 94 94     TODAY'S TREATMENT:       Ther Act  STG Assessment   [facility] PT Assessment - 10/29/22 1134       Ambulation/Gait   Gait velocity 32.8' over 49.93s = 0.66 ft/s w/RW   SBA          Pt performed sit > supine > sit edge of mat w/min A for RLE management and minor trunk support. Pt able to cross LLE under RLE to get into supine and then push-up from R elbow to perform supine to sit. Pt reported feeling dizzy once sitting up, so assessed vitals (see above) and systolic BP elevated. Reassessed after a few minutes and pt reported no longer feeling dizzy.  Replaced pt's velcro on R hand orthotic this date and had pt practice donning/doffing hand x5 reps. Pt able to shrug shoulder but has absent shoulder flexion to bring hand forward onto orthotic, requiring min A. However, pt able to don/doff velcro strap independently.     Gait pattern:  ER of RLE, step to pattern, decreased step length- Right, decreased stride length, decreased hip/knee flexion- Right, decreased ankle dorsiflexion- Right, lateral lean- Left, wide BOS, and poor foot clearance-  Right Distance walked: 115' loop completed 1x + 80' = 195'  Assistive device utilized: Environmental consultant - 2 wheeled Level of assistance: SBA Comments: w/R AFO donned. Pt required min A to don hand orthotic. Noted improved weight shift to R side and reduced catching of R foot this date w/decreased reliance on circumduction.                                                                                                                       PATIENT EDUCATION: Education details: Continue working on sit to stands at home  Person educated: Patient Education method: Explanation Education comprehension: verbalized understanding  HOME EXERCISE PROGRAM: To be established   GOALS: Goals reviewed with patient? Yes  SHORT TERM GOALS: Target date: 10/28/2022   Pt will perform initial HEP w/min A from caregiver for improved strength, balance, transfers and gait.  Baseline: to be established  Goal status: IN PROGRESS  2.  Pt will improve gait velocity to at least 0.7 ft/s w/RW and CGA for improved gait efficiency and independence   Baseline: 0.49 ft/s w/RW and min A; 0.66 ft/s w/RW and SBA Goal status: PARTIALLY MET   3.  Pt will be able to don and doff R hand orthotic on RW w/CGA for improved independence and weight shift to R side  Baseline: max A; min A Goal status: IN PROGRESS   4.  Pt will ambulate greater than or  equal to 300 feet on with LRAD and CGA for improved cardiovascular endurance and BLE strength.  Baseline: 213 ft with RW and min A (9/17); 195' w/RW and SBA  Goal status: PARTIALLY MET   5.  Berg to be assessed and LTG written  Baseline: 25/56 Goal status: MET  6.  Pt will perform supine > sit EOB w/min A or use of rail for improved independence and functional strength Baseline: mod-max A; min A (9/26) Goal status: MET  LONG TERM GOALS: Target date: 11/25/2022   Pt will be independent with final HEP for improved strength, balance, transfers and gait.  Baseline:  Goal  status: INITIAL  2.  Pt will improve gait velocity to at least 1.0 ft/s w/LRAD and SBA for improved gait efficiency and independence   Baseline:  Goal status: INITIAL  3.  Pt will ambulate greater than or equal to 400 feet on with LRAD and CGA for improved cardiovascular endurance and BLE strength.   Baseline: 213 ft with RW and min A (9/17)  Goal status: INITIAL  4.  Pt will improve Berg score to 34/56 for decreased fall risk  Baseline: 25/56 Goal status: REVISED  5.  Pt will perform bed mobility at CGA or SBA level for improved independence at home  Baseline: mod-max A Goal status: INITIAL   ASSESSMENT:  CLINICAL IMPRESSION: Emphasis of skilled PT session on STG assessment. Pt has met 2 of 6 STGs, completing his Berg assessment and performing sit >supine w/min A. Pt has partially met 2 of 6 STGs, improving his gait speed, just not quite to goal level. Pt did not improve his distance on but did not require as much assistance w/gait, so consider his goal partially met. Pt continues to progress 2 of 6 of his STGs, as he has not been given a formal HEP this date and continues to work towards donning/doff his R hand into his hand orthotic without assistance. Pt overall has significantly improved his gait kinematics and safety w/use of RW as well as his endurance. Pt will continue to benefit from skilled PT to improve functional independence and reduce fall risk. Continue POC.    OBJECTIVE IMPAIRMENTS: Abnormal gait, decreased activity tolerance, decreased balance, decreased coordination, decreased mobility, difficulty walking, decreased strength, impaired tone, impaired UE functional use, improper body mechanics, and pain.   ACTIVITY LIMITATIONS: carrying, lifting, standing, squatting, stairs, transfers, bed mobility, bathing, toileting, dressing, self feeding, reach over head, hygiene/grooming, locomotion level, and caring for others  PARTICIPATION LIMITATIONS: meal prep,  cleaning, laundry, medication management, personal finances, interpersonal relationship, driving, shopping, community activity, and yard work  PERSONAL FACTORS: Fitness, Past/current experiences, Transportation, and 1 comorbidity: CVA  are also affecting patient's functional outcome.   REHAB POTENTIAL: Good  CLINICAL DECISION MAKING: Evolving/moderate complexity  EVALUATION COMPLEXITY: Moderate  PLAN:  PT FREQUENCY: 2x/week  PT DURATION: 8 weeks (POC written for 10 weeks due to delay in scheduling)   PLANNED INTERVENTIONS: Therapeutic exercises, Therapeutic activity, Neuromuscular re-education, Balance training, Gait training, Patient/Family education, Self Care, Joint mobilization, Stair training, Orthotic/Fit training, DME instructions, Aquatic Therapy, Dry Needling, Electrical stimulation, Manual therapy, and Re-evaluation  PLAN FOR NEXT SESSION:  continue with Bioness to R HS and ant tib; RLE NMR, weight shift to R side, work on bed mobility and donning/doffing RUE from hand orthotic and donning/doffing AFO more independently    Josephine Igo, PT, DPT [facility] [address] [place], [place]  [address] Phone:  [phone]  Fax:  [phone]  10/29/2022, 11:58 AM

## 2022-11-03 ENCOUNTER — Ambulatory Visit: Payer: Medicare HMO | Attending: Internal Medicine | Admitting: Physical Therapy

## 2022-11-03 ENCOUNTER — Ambulatory Visit: Payer: Medicare HMO | Admitting: Occupational Therapy

## 2022-11-03 VITALS — BP 117/78 | HR 103

## 2022-11-03 DIAGNOSIS — I63512 Cerebral infarction due to unspecified occlusion or stenosis of left middle cerebral artery: Secondary | ICD-10-CM

## 2022-11-03 DIAGNOSIS — S43111D Subluxation of right acromioclavicular joint, subsequent encounter: Secondary | ICD-10-CM | POA: Diagnosis present

## 2022-11-03 DIAGNOSIS — R278 Other lack of coordination: Secondary | ICD-10-CM | POA: Diagnosis present

## 2022-11-03 DIAGNOSIS — I69351 Hemiplegia and hemiparesis following cerebral infarction affecting right dominant side: Secondary | ICD-10-CM | POA: Diagnosis present

## 2022-11-03 DIAGNOSIS — M6281 Muscle weakness (generalized): Secondary | ICD-10-CM

## 2022-11-03 DIAGNOSIS — R2689 Other abnormalities of gait and mobility: Secondary | ICD-10-CM | POA: Insufficient documentation

## 2022-11-03 DIAGNOSIS — R2681 Unsteadiness on feet: Secondary | ICD-10-CM

## 2022-11-03 NOTE — Therapy (Signed)
OUTPATIENT PHYSICAL THERAPY NEURO TREATMENT   Patient Name: Jimmy Valentine MRN: [id number] DOB:Aug 01, 1950, 72 y.o., male Today's Date: 11/03/2022   PCP: Wanda Plump, MD REFERRING PROVIDER: Wanda Plump, MD  END OF SESSION:  PT End of Session - 11/03/22 1108     Visit Number 6    Number of Visits 17   Plus eval   Date for PT Re-Evaluation 12/09/22    Authorization Type [employer]    PT Start Time 1107   Previous pt session ran late   PT Stop Time 1147    PT Time Calculation (min) 40 min    Equipment Utilized During Treatment Gait belt   AFO   Activity Tolerance Patient tolerated treatment well    Behavior During Therapy WFL for tasks assessed/performed                 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,had a MRI, was prescribed testosterone by endocrinology   Sleep apnea    does't use cpap   Sudden hearing loss, left 2017   idiopathic sensorineural hearing loss   Past Surgical History:  Procedure Laterality Date   TOTAL KNEE ARTHROPLASTY Right 2005   TOTAL KNEE ARTHROPLASTY  12/04/2011   LEFT-- TOTAL KNEE ARTHROPLASTY;  Surgeon: Eugenia Mcalpine, MD;  Location: [facility];  Service: Orthopedics;  Laterality: Left;   Patient Active Problem List   Diagnosis Date Noted   Hemiparesis affecting right side as late effect of cerebrovascular accident (CVA) (HCC) 09/04/2022   Gait disturbance, post-stroke 09/04/2022   Hyponatremia 07/18/2022   Aphasia due to acute cerebrovascular accident (CVA) (HCC) 07/15/2022   Acute ischemic left MCA stroke (HCC) 06/24/2022   Stroke (cerebrum) (HCC) 06/21/2022   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 10/18/2014   Elevated LFTs 04/13/2014   Diabetes (HCC) 09/03/2011   Gout 09/03/2011   OSA (obstructive sleep apnea) 04/22/2011   Annual physical exam 12/04/2010   Hypertension    Hyperlipidemia    Hypogonadism male    Erectile dysfunction     ONSET DATE: 09/23/2022 (referral)   REFERRING DIAG: U44.034 (ICD-10-CM) - Hemiparesis affecting right side as late effect of cerebrovascular accident (CVA) (HCC)  THERAPY DIAG:  Unsteadiness on feet  Other lack of coordination  Muscle weakness (generalized)  Hemiplegia and hemiparesis following cerebral infarction affecting right dominant side (HCC)  Rationale for Evaluation and Treatment: Rehabilitation  SUBJECTIVE:  SUBJECTIVE STATEMENT: Pt denies any acute changes since last visit, no complaints of pain today. Is tired today.   Pt accompanied by:  Caregiver, [name] (in lobby)   PERTINENT HISTORY: L MCA CVA in May 2024   PAIN:  Are you having pain? No  PRECAUTIONS: Fall  WEIGHT BEARING RESTRICTIONS: No  FALLS: Has patient fallen in last 6 months? Yes. Number of falls 1  LIVING ENVIRONMENT: Lives with: lives alone Lives in: House/apartment Stairs: Yes: External: 3 steps; on right going up, on left going up, and can reach both Has following equipment at home: Walker - 2 wheeled, Wheelchair (manual), shower chair, bed side commode, and AFO  PLOF: Independent  PATIENT GOALS: "Get back to where I can walk by myself without the walker and without assistance"   OBJECTIVE:   DIAGNOSTIC FINDINGS: MRI of brain on 06/22/22  IMPRESSION: 1. Positive for Left MCA territory lacunar type infarct tracking from the left corona radiata to the lentiform. Petechial hemorrhage (Heidelberg classification 1b:  HI2). No malignant hemorrhagic transformation. No significant mass effect.   2. No other acute intracranial abnormality. 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  CT angio of head/neck on 06/21/22 IMPRESSION: 1. 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 The left MCA bi/trifurcation and left M2 branches remain patent.   2. Superimposed Severe intracranial and cervical ICA Atherosclerosis resulting in: - distal Right Vertebral Artery (V4) OCCLUSION. - distal Left Vertebral Artery Severe (V4) stenosis. - Right ICA origin RADIOGRAPHIC STRING SIGN stenosis due to calcified plaque. - Left ICA origin stenosis due to complex plaque estimated 65-70%. - Severe Left ICA supraclinoid stenosis due to calcified plaque.  COGNITION: Overall cognitive status: Within functional limits for tasks assessed   SENSATION: Pt denies numbness/tingling in RLE/RUE   COORDINATION: Heel to shin test: WNL on LLE, unable to perform hip flexion on RLE but able to achieve cross-legged position    MUSCLE TONE: Mild clonus in R foot that fatigues after 3s   POSTURE: rounded shoulders and forward head   LOWER EXTREMITY MMT:  Tested in seated position   MMT Right Eval Left Eval  Hip flexion 2- 4+  Hip extension    Hip abduction 1 4+  Hip adduction 2 4+  Hip internal rotation    Hip external rotation    Knee flexion 3 4+  Knee extension 2 4+  Ankle dorsiflexion 4- 4+  Ankle plantarflexion    Ankle inversion    Ankle eversion    (Blank rows = not tested)  BED MOBILITY:  Has a rail but requires assistance (up to max) to roll and perform sit <>supine   TRANSFERS: Assistive device utilized: Environmental consultant - 2 wheeled  Sit to stand: SBA Stand to sit: SBA Pt kickstands RLE and  leans to L side to perform    GAIT: Gait pattern: step to pattern, decreased step length- Right, decreased stride length, decreased hip/knee flexion- Right, decreased ankle dorsiflexion- Right, Left hip hike, lateral lean- Left, and poor foot clearance- Right Distance walked: Various clinic distances  Assistive device utilized: Walker - 2 wheeled Level of assistance: CGA and Min A Comments: Pt not wearing AFO this date. Noted pt dragging RLE on ground, especially w/fatigue, and would lose balance anteriorly requiring min A to stabilize. Pt also vaults to progress  RLE and frequently tips RW over on L side.   VITALS Vitals:   11/03/22 1141  BP: 117/78  Pulse: (!) 103      TODAY'S TREATMENT:       NMR  Attempted to use Bioness during session, but unable to connect to bluetooth so used AFO during session instead.  RLE toe taps to 6" step w/LUE support on rail, x15 reps for improved hip/knee flexion and reduced circumduction compensation. Pt initially using momentum to perform, so cued to facilitate knee flexion instead.  At ballet bar, lateral R toe taps to 6" step w/BUE support on rail, 2x8 reps. Pt very challenged by this and required seated rest breaks. Noted tremor in RUE throughout activity as well. Pt reported he is very fatigued today and did not eat much prior to session. Assessed vitals (see above) and BP WNL, but pt requested to end session due to fatigue.     Gait Training  Gait pattern:  ER of RLE, step to pattern, decreased step length- Right, decreased stride length, decreased hip/knee flexion- Right, decreased ankle dorsiflexion- Right, lateral lean- Left, wide BOS, and poor foot clearance- Right Distance walked: various clinic distances  Assistive device utilized: Walker - 2 wheeled Level of assistance: SBA Comments: w/R AFO donned. Escorted pt out of clinic due to fatigue                                                                                                                        PATIENT EDUCATION: Education details: Continue working on sit to stands at home  Person educated: Patient Education method: Explanation Education comprehension: verbalized understanding  HOME EXERCISE PROGRAM: To be established   GOALS: Goals reviewed with patient? Yes  SHORT TERM GOALS: Target date: 10/28/2022   Pt will perform initial HEP w/min A from caregiver for improved strength, balance, transfers and gait.  Baseline: to be established  Goal status: IN PROGRESS  2.  Pt will improve gait velocity to at least 0.7 ft/s w/RW and CGA for improved gait efficiency and independence   Baseline: 0.49 ft/s w/RW and min A; 0.66 ft/s w/RW and SBA Goal status: PARTIALLY MET   3.  Pt will be able to don and doff R hand orthotic on RW w/CGA for improved independence and weight shift to R side  Baseline: max A; min A Goal status: IN PROGRESS   4.  Pt will ambulate greater than or equal to 300 feet on with LRAD and CGA for improved cardiovascular endurance and BLE strength.  Baseline: 213 ft with RW and min A (9/17); 195' w/RW and SBA  Goal status: PARTIALLY MET   5.  Berg to be assessed and LTG written  Baseline: 25/56 Goal status: MET  6.  Pt will perform supine > sit EOB w/min A or use of rail for improved independence and functional strength Baseline: mod-max A; min A (9/26) Goal status: MET  LONG TERM GOALS: Target date: 11/25/2022   Pt  will be independent with final HEP for improved strength, balance, transfers and gait.  Baseline:  Goal status: INITIAL  2.  Pt will improve gait velocity to at least 1.0 ft/s w/LRAD and SBA for improved gait efficiency and independence   Baseline:  Goal status: INITIAL  3.  Pt will ambulate greater than or equal to 400 feet on with LRAD and CGA for improved cardiovascular endurance and BLE strength.   Baseline: 213 ft with RW and min A (9/17)  Goal status: INITIAL  4.  Pt will improve Berg score to  34/56 for decreased fall risk  Baseline: 25/56 Goal status: REVISED  5.  Pt will perform bed mobility at CGA or SBA level for improved independence at home  Baseline: mod-max A Goal status: INITIAL   ASSESSMENT:  CLINICAL IMPRESSION: Emphasis of skilled PT session on LE coordination, R hip/knee flexion strength and reduction of circumduction strategy w/gait. Pt every fatigued today and required frequent rest breaks but was able to facilitate knee and hip flexion of RLE w/activity. Continue POC.    OBJECTIVE IMPAIRMENTS: Abnormal gait, decreased activity tolerance, decreased balance, decreased coordination, decreased mobility, difficulty walking, decreased strength, impaired tone, impaired UE functional use, improper body mechanics, and pain.   ACTIVITY LIMITATIONS: carrying, lifting, standing, squatting, stairs, transfers, bed mobility, bathing, toileting, dressing, self feeding, reach over head, hygiene/grooming, locomotion level, and caring for others  PARTICIPATION LIMITATIONS: meal prep, cleaning, laundry, medication management, personal finances, interpersonal relationship, driving, shopping, community activity, and yard work  PERSONAL FACTORS: Fitness, Past/current experiences, Transportation, and 1 comorbidity: CVA  are also affecting patient's functional outcome.   REHAB POTENTIAL: Good  CLINICAL DECISION MAKING: Evolving/moderate complexity  EVALUATION COMPLEXITY: Moderate  PLAN:  PT FREQUENCY: 2x/week  PT DURATION: 8 weeks (POC written for 10 weeks due to delay in scheduling)   PLANNED INTERVENTIONS: Therapeutic exercises, Therapeutic activity, Neuromuscular re-education, Balance training, Gait training, Patient/Family education, Self Care, Joint mobilization, Stair training, Orthotic/Fit training, DME instructions, Aquatic Therapy, Dry Needling, Electrical stimulation, Manual therapy, and Re-evaluation  PLAN FOR NEXT SESSION:  continue with Bioness to R HS and ant  tib; RLE NMR, weight shift to R side, work on bed mobility and donning/doffing RUE from hand orthotic and donning/doffing AFO more independently    Josephine Igo, PT, DPT [facility] [address] [place], [place]  [address] Phone:  [phone] Fax:  [phone]  11/03/2022, 11:54 AM

## 2022-11-03 NOTE — Therapy (Signed)
OUTPATIENT OCCUPATIONAL THERAPY NEURO TREATMENT  Patient Name: Jimmy Valentine MRN: [id number] DOB:[birth year], 72 y.o., male Today's Date: 11/03/2022  PCP: Wanda Plump, MD REFERRING PROVIDER: Wanda Plump, MD  END OF SESSION:  OT End of Session - 11/03/22 1150     Visit Number 6    Number of Visits 16   + evaluaiton   Date for OT Re-Evaluation 12/11/22    Authorization Type [employer] MC 2024 Met VL: [place] Auth Not Reqd    OT Start Time 1017    OT Stop Time 1100    OT Time Calculation (min) 43 min    Equipment Utilized During Treatment cones    Activity Tolerance Patient tolerated treatment well    Behavior During Therapy WFL for tasks assessed/performed;Flat affect              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,had a MRI, was prescribed testosterone by endocrinology   Sleep apnea    does't use cpap   Sudden hearing loss, left 2017   idiopathic sensorineural hearing loss   Past Surgical History:  Procedure Laterality Date   TOTAL KNEE ARTHROPLASTY Right 2005   TOTAL KNEE ARTHROPLASTY  12/04/2011   LEFT-- TOTAL KNEE ARTHROPLASTY;  Surgeon: Eugenia Mcalpine, MD;  Location: [facility];  Service: Orthopedics;  Laterality: Left;   Patient Active Problem List   Diagnosis Date Noted   Hemiparesis affecting right side as late effect of cerebrovascular accident (CVA) (HCC) 09/04/2022   Gait disturbance, post-stroke 09/04/2022   Hyponatremia 07/18/2022   Aphasia due to acute cerebrovascular accident (CVA) (HCC) 07/15/2022   Acute ischemic left MCA stroke (HCC) 06/24/2022   Stroke (cerebrum) (HCC) 06/21/2022   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 10/18/2014   Elevated LFTs 04/13/2014   Diabetes (HCC) 09/03/2011   Gout 09/03/2011   OSA (obstructive sleep apnea) 04/22/2011   Annual physical exam 12/04/2010   Hypertension    Hyperlipidemia    Hypogonadism male    Erectile dysfunction     ONSET DATE: Referral date: 10/01/2022  Stroke 06/21/22  REFERRING DIAG: I69.351 (ICD-10-CM) - Hemiplegia and hemiparesis following cerebral infarction affecting right dominant side (HCC)  THERAPY DIAG:   Muscle weakness (generalized)  Other lack of coordination  Hemiplegia and hemiparesis following cerebral infarction affecting right dominant side (HCC)  Subluxation of acromioclavicular joint, right, subsequent encounter  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 Pt confirmed he had no questions or concerns  regarding HEP. Pt reported wearing splint 2-3 hours per day.  Pt accompanied by: self  PERTINENT HISTORY:  Presented to [facility] ED on 06/21/2022 with sudden onset of right-sided weakness, dysarthria and facial drooping. MRI showed large MCA territory basal ganglia infarct with petechial hemorrhage with no hemorrhagic transformation or mass effect.   Inpatient Rehab: 06/24/2022 - 07/30/2022 Patient discharged at overall  Min Assist level.  Patient's care partners able to provide the necessary physical assistance at discharge.   Recommendation:  Patient will benefit from ongoing skilled OT services in home health setting to continue to advance functional skills in the area of BADL, iADL, and Reduce care partner burden. Equipment: Shower chair, commode, RW, R RW hand splint, R resting hand splint, LH sponge, Dycem   Home Health therapies 07/24 - 08/24      PRECAUTIONS: Fall  WEIGHT BEARING RESTRICTIONS: No  PAIN:  Are you having pain? No  FALLS: Has patient fallen in last 6 months? Yes. Number of falls 1x - fell on his bottom about a month ago - got the walker stuck on the doorframe  LIVING ENVIRONMENT: Lives with: lives alone and Caregiver 3-4 days/week x 12 hours, daughter, son and brother-in-law assist on other days Lives in: House/apartment Stairs: Yes: External: 3-5 steps; can reach both Has following equipment at home: Environmental consultant - 2 wheeled, Wheelchair (manual), Shower bench, bed side commode, Grab bars, and urinal  PLOF: Independent - still driving, played golf, retired Immunologist with lots traveling (driving)  PATIENT GOALS: To get my right side back working again  OBJECTIVE:   HAND DOMINANCE: Right (affected side)  ADLs: Overall ADLs: Min to mod assistance Transfers/ambulation related to ADLs: Supervision Eating: Gets help to cut food as needed, having to use L hand Grooming: Used regular razor yesterday for the first time UB Dressing: Needs help to get R arm in sleeve LB Dressing: Can pull pants up most of the time Toileting: Most of the time he is able to perform on his own; uses urinal at bedside at night Bathing: "50/50" with caregiver Tub Shower transfers: Gets help to get his leg into the tub Equipment: Transfer tub bench, Grab bars, bed side commode, and Long handled sponge  IADLs: Shopping: caregiver and family (daughter) Light housekeeping: Caregiver Meal Prep: Psychologist, counselling mobility: RW with hand orthosis & dycem + strap for R hand Medication management: Assistance from caregivers Financial management: Assistance from family Handwriting: unable with R UE; not assessed with L UE  MOBILITY STATUS: Needs Assist: Help to get his R UE/hand on the handle of his walker and Hx of falls  POSTURE COMMENTS:  rounded shoulders and forward head Sitting balance:  WFL in arm chair at table  ACTIVITY  TOLERANCE: Activity tolerance: Limited due to R hemiplegia  FUNCTIONAL OUTCOME MEASURES: PSFS - 1.0  UPPER EXTREMITY ROM:    Active ROM Right eval Left eval  Shoulder flexion  [facility]  Shoulder abduction    Shoulder adduction    Shoulder extension min   Shoulder internal rotation    Shoulder external rotation    Elbow flexion min   Elbow extension    Wrist flexion min   Wrist extension    Wrist ulnar deviation    Wrist radial deviation    Wrist pronation    Wrist supination    (Blank rows = not tested)  UPPER EXTREMITY MMT:     MMT Right eval Left eval  Shoulder flexion  4/5  Shoulder abduction    Shoulder adduction    Shoulder extension    Shoulder internal rotation    Shoulder external rotation    Middle trapezius    Lower trapezius    Elbow flexion    Elbow extension    Wrist flexion    Wrist extension    Wrist ulnar deviation    Wrist radial deviation    Wrist  pronation    Wrist supination    (Blank rows = not tested)  HAND FUNCTION: Grip strength: Right: 3.9, 3.7, 5.2 lbs; Left: 34.8, 36.5, 37.9 lbs Average: Right 4.3 lbs Left 36.4 lbs  COORDINATION: Unable to perform R UE coordinaiton tasks,   SENSATION: WFL  EDEMA: Slight RUE  MUSCLE TONE: RUE: Hypotonic and Flaccid  COGNITION: Overall cognitive status: Within functional limits for tasks assessed and h/o more severe aphasia and does self-report need to think about what he is trying to say etc  VISION: Subjective report: Eye Dr ~ 3 weeks Baseline vision: Wears glasses for reading only Visual history: cataracts  VISION ASSESSMENT: TBA  Patient is not having difficulty with activities due to vision.  PERCEPTION: Not tested  PRAXIS: Not tested  OBSERVATIONS: Pt ambulated into clinic w/RW and R hand orthosis. Patient was wearing his AFO which he stated he stopped wearing after he finished home health therapy.   TODAY'S TREATMENT:                                                                                                                               Pt presented with with small open wounds on L dorsal hand and forearm. Pt reported running into door frame. OT cleaned wounds and covered with bandages.  At end of session, pt ambulated to mat with SBA and remained seated on mat with supervision at end of OT session in preparation for upcoming PT session. OT updated PT that pt was ready for PT session.  Neuro Re-Ed Neuromuscular reeducation continuing to include specific visual, tactile and verbal cues and assistance to stimulate the neuromuscular system and promote functional movement in RUE.   Seated elbow ext and shoulder ext rotation with towel to improve ROM for additional therapeutic activities. Intermittent rest breaks between reps PRN. 4 sets, 5-10 reps.  Edu sleep positioning for joint protection. Pt verbalized understanding. Pt described currently sleeping supine. Handout provided (see pt instructions).  Pt expressed concerns about insurance paying for [facility] and stated intent to follow up about MYOMO if insurance covers. OT provided pt with handout instructions about MYOMO and contact information.  Weight-bearing to improve proprioception, joint stability, and strengthening. While seated, pt moved x5 large cones on tabletop from left to right then back for x5 sets, while weight-bearing through R hand at R side as tolerated. OT provided RUE support at elbow and wrist to promote correct positioning. Pt reported pain during task d/t R thumb ext and RUE fatigue after task. OT repositioned pt's RUE to improve pain tolerance.   NMES OT educated patient on use of NMES unit as well as e-stim to encourage improved nerve function as needed to produce more active RUE wrist ext. Therapist educated patient on application for RUE wrist ext and applied electrodes accordingly. Therapist utilized NMES channel 1, Small, Custom: Time: 10 minutes, On time: 10 s, Off time: 8 s, Ramp 2s, Rate 50  Hz, Sym waveform.  Patient tolerating up to 34 intensity with supervised application to promote improved AROM and pain management. Therapist cued patient to complete AAROM wrist ext of affected UE.    PATIENT EDUCATION: Education details: sleep positioning, NMES, weightbearing, MYOMO, activity tolerance Person educated: Patient Education method: Explanation, Demonstration, Tactile cues, Verbal cues, and Handouts Education comprehension: verbalized understanding, returned demonstration, verbal cues required, tactile cues required, and needs further education  HOME EXERCISE PROGRAM:  10/20/22 - UE ROM (grip, forearm, elbow flex, shoulder shrugs) - no images provided only written suggestions. 10/27/2022: updated ROM HEP Access Code [email or web address] 11/03/22 - sleep positioning handout provided  GOALS: Goals reviewed with patient? Yes  SHORT TERM GOALS: Target date: 11/13/22  Patient will demonstrate updated RUE HEP with 25% verbal cues or less for proper execution.  Baseline: New to outpatient OT.  Self reported difficulty with motivation at home Goal status: IN progress  2.  Patient will demonstrate at least 8+ lbs RUE grip strength as needed to stabilize container on tabletop/hold large handled object.  Baseline: Right 4.3 lbs Goal status: IN progress  3.  Patient will be able to don T-shirt without physical assistance x 5 days in a row. Baseline: Caregiver assistance to get R UE in sleeve Goal status: INITIAL  4.  Patient will improve R UE AROM for shoulder elevation and elbow flexion to lift arm/hand to walker.  Baseline: Caregiver assist or uses LUE.  Trace elbow/shoulder movement Goal status: IN Progress  5.  Patient will be assisted to don/doff appropriate splints/positioning devices to minimize subluxation and minimize risk of RUE/hand contracture. Baseline: None place Goal status: IN Progress   LONG TERM GOALS: Target date: 12/11/22  Patient will demonstrate updated RUE HEP with  visual schedule and images for proper execution and frequency. Baseline: New to outpatient OT.  Self reported difficulty with motivation at home. Goal status: IN Progress  2.  Patient will demonstrate at least 10 lbs RUE grip strength as needed to hold objects for manipulation by LUE. Baseline: Right 4.3 lbs Goal status: IN Progress  3.  Patient will be able to get to EOB with no physical assistance to toilet with BSC as needed. Baseline: Assistance to get to EOB from caregivers. Goal status: IN Progress  4.  Patient will be able to prepare simple food items with appropriate AE (small crock pot, microwave, toaster oven etc) Baseline: Caregiver/family perform meal prep  Goal status: INITIAL  5.  Patient will report at least two-point increase in average PSFS score or at least three-point increase in a single activity score indicating functionally significant improvement given minimum detectable change. Baseline: 1.0 total score (See above for individual activity scores)  Goal status: INITIAL   ASSESSMENT:  CLINICAL IMPRESSION: Patient is a 72 y.o. male who was seen today for occupational therapy treatment for left MCA stroke with RUE weakness/hemiparesis. Pt responded well to NMES R wrist ext and neuro re-ed for RUE A/AROM activities which involved weightbearing through R hand.  Pt will continue to benefit from trials of NMES for R UE to improve neuromuscular return and functional use of RUE.  He will benefit from continued skilled OT services in the outpatient setting to work on RUE impairments to help pt return to higher independence, safety and as modified independence as able.      PERFORMANCE DEFICITS: in functional skills including ADLs, coordination, dexterity, proprioception, sensation, tone, ROM, strength, flexibility, Fine motor control, Gross motor control, mobility, balance, endurance, decreased knowledge of use of DME, skin  integrity, and UE functional use, cognitive skills  including energy/drive, safety awareness, and sequencing, and psychosocial skills including coping strategies, environmental adaptation, and routines and behaviors.   IMPAIRMENTS: are limiting patient from ADLs, IADLs, rest and sleep, leisure, and social participation.   CO-MORBIDITIES: may have co-morbidities  that affects occupational performance. Patient will benefit from skilled OT to address above impairments and improve overall function.  REHAB POTENTIAL: Good   PLAN:  OT FREQUENCY: 1-2x/week  OT DURATION: 8 weeks  PLANNED INTERVENTIONS: self care/ADL training, therapeutic exercise, therapeutic activity, neuromuscular re-education, manual therapy, passive range of motion, balance training, functional mobility training, aquatic therapy, splinting, electrical stimulation, patient/family education, cognitive remediation/compensation, visual/perceptual remediation/compensation, energy conservation, coping strategies training, and DME and/or AE instructions  RECOMMENDED OTHER SERVICES: PT evaluation already completed.  CONSULTED AND AGREED WITH PLAN OF CARE: Patient and family member/caregiver  PLAN FOR NEXT SESSION:  Continue E-stim protocols for R UE AAROM as tolerated Splinting needs - hand splint  (11/03/22 - OT requested pt to bring current splint next visit and OT to evaluate splint fit. Please see Amada Kingfisher, OTR/L for pre-prepared potential splint modifications PRN.)  Consideration for Neuro Hand Glove options Mellissa Kohut v. [employer] option (online visual video watched 10/20/22) Myomo v. [employer]istim (additional info on handout provided 11/03/22) ADL dressing trials and IADLS (cooking)   Wynetta Emery, OT 11/03/2022, 12:15 PM

## 2022-11-05 ENCOUNTER — Ambulatory Visit: Payer: Medicare HMO | Admitting: Physical Therapy

## 2022-11-05 ENCOUNTER — Ambulatory Visit: Payer: Medicare HMO | Admitting: Occupational Therapy

## 2022-11-05 VITALS — BP 127/72 | HR 86

## 2022-11-05 DIAGNOSIS — R278 Other lack of coordination: Secondary | ICD-10-CM

## 2022-11-05 DIAGNOSIS — M6281 Muscle weakness (generalized): Secondary | ICD-10-CM

## 2022-11-05 DIAGNOSIS — I69351 Hemiplegia and hemiparesis following cerebral infarction affecting right dominant side: Secondary | ICD-10-CM

## 2022-11-05 DIAGNOSIS — R2689 Other abnormalities of gait and mobility: Secondary | ICD-10-CM

## 2022-11-05 DIAGNOSIS — S43111D Subluxation of right acromioclavicular joint, subsequent encounter: Secondary | ICD-10-CM

## 2022-11-05 DIAGNOSIS — R2681 Unsteadiness on feet: Secondary | ICD-10-CM

## 2022-11-05 NOTE — Therapy (Signed)
OUTPATIENT PHYSICAL THERAPY NEURO TREATMENT   Patient Name: Jimmy Valentine MRN: [id number] DOB:[birth year], 72 y.o., male Today's Date: 11/05/2022   PCP: Wanda Plump, MD REFERRING PROVIDER: Wanda Plump, MD  END OF SESSION:  PT End of Session - 11/05/22 1022     Visit Number 7    Number of Visits 17   Plus eval   Date for PT Re-Evaluation 12/09/22    Authorization Type [employer]    PT Start Time 1018    PT Stop Time 1102    PT Time Calculation (min) 44 min    Equipment Utilized During Treatment Gait belt;Other (comment)   Bioness L300   Activity Tolerance Patient tolerated treatment well    Behavior During Therapy WFL for tasks assessed/performed                  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,had a MRI, was prescribed testosterone by endocrinology   Sleep apnea    does't use cpap   Sudden hearing loss, left 2017   idiopathic sensorineural hearing loss   Past Surgical History:  Procedure Laterality Date   TOTAL KNEE ARTHROPLASTY Right 2005   TOTAL KNEE ARTHROPLASTY  12/04/2011   LEFT-- TOTAL KNEE ARTHROPLASTY;  Surgeon: Eugenia Mcalpine, MD;  Location: [facility];  Service: Orthopedics;  Laterality: Left;   Patient Active Problem List   Diagnosis Date Noted   Hemiparesis affecting right side as late effect of cerebrovascular accident (CVA) (HCC) 09/04/2022   Gait disturbance, post-stroke 09/04/2022   Hyponatremia 07/18/2022   Aphasia due to acute cerebrovascular accident (CVA) (HCC) 07/15/2022   Acute ischemic left MCA stroke (HCC) 06/24/2022   Stroke (cerebrum) (HCC) 06/21/2022   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 10/18/2014   Elevated LFTs 04/13/2014   Diabetes (HCC) 09/03/2011   Gout 09/03/2011   OSA (obstructive sleep apnea) 04/22/2011   Annual physical exam 12/04/2010   Hypertension    Hyperlipidemia    Hypogonadism male    Erectile dysfunction     ONSET DATE: 09/23/2022 (referral)   REFERRING DIAG: Y86.578 (ICD-10-CM) - Hemiparesis affecting right side as late effect of cerebrovascular accident (CVA) (HCC)  THERAPY DIAG:  Hemiplegia and hemiparesis following cerebral infarction affecting right dominant side (HCC)  Unsteadiness on feet  Other abnormalities of gait and mobility  Muscle weakness (generalized)  Rationale for Evaluation and Treatment: Rehabilitation  SUBJECTIVE:  SUBJECTIVE STATEMENT: Pt reports doing okay. Having some L shoulder pain today and took some tylenol. Denies falls.   Pt accompanied by:  Caregiver, [name] (in lobby)   PERTINENT HISTORY: L MCA CVA in May 2024   PAIN:  Are you having pain? Yes: NPRS scale: 6/10 Pain location: L shoulder  Pain description: Achy/throbbing   PRECAUTIONS: Fall  WEIGHT BEARING RESTRICTIONS: No  FALLS: Has patient fallen in last 6 months? Yes. Number of falls 1  LIVING ENVIRONMENT: Lives with: lives alone Lives in: House/apartment Stairs: Yes: External: 3 steps; on right going up, on left going up, and can reach both Has following equipment at home: Walker - 2 wheeled, Wheelchair (manual), shower chair, bed side commode, and AFO  PLOF: Independent  PATIENT GOALS: "Get back to where I can walk by myself without the walker and without assistance"   OBJECTIVE:   DIAGNOSTIC FINDINGS: MRI of brain on 06/22/22  IMPRESSION: 1. Positive for Left MCA territory lacunar type infarct tracking from the left  corona radiata to the lentiform. Petechial hemorrhage (Heidelberg classification 1b: HI2). No malignant hemorrhagic transformation. No significant mass effect.   2. No other acute intracranial abnormality. 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  CT angio of head/neck on 06/21/22 IMPRESSION: 1. 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 The left MCA bi/trifurcation and left M2 branches remain patent.   2. Superimposed Severe intracranial and cervical ICA Atherosclerosis resulting in: - distal Right Vertebral Artery (V4) OCCLUSION. - distal Left Vertebral Artery Severe (V4) stenosis. - Right ICA origin RADIOGRAPHIC STRING SIGN stenosis due to calcified plaque. - Left ICA origin stenosis due to complex plaque estimated 65-70%. - Severe Left ICA supraclinoid stenosis due to calcified plaque.  COGNITION: Overall cognitive status: Within functional limits for tasks assessed   SENSATION: Pt denies numbness/tingling in RLE/RUE   COORDINATION: Heel to shin test: WNL on LLE, unable to perform hip flexion on RLE but able to achieve cross-legged position    MUSCLE TONE: Mild clonus in R foot that fatigues after 3s   POSTURE: rounded shoulders and forward head   LOWER EXTREMITY MMT:  Tested in seated position   MMT Right Eval Left Eval  Hip flexion 2- 4+  Hip extension    Hip abduction 1 4+  Hip adduction 2 4+  Hip internal rotation    Hip external rotation    Knee flexion 3 4+  Knee extension 2 4+  Ankle dorsiflexion 4- 4+  Ankle plantarflexion    Ankle inversion    Ankle eversion    (Blank rows = not tested)  BED MOBILITY:  Has a rail but requires assistance (up to max) to roll and perform sit <>supine   TRANSFERS: Assistive device  utilized: Environmental consultant - 2 wheeled  Sit to stand: SBA Stand to sit: SBA Pt kickstands RLE and leans to L side to perform    GAIT: Gait pattern: step to pattern, decreased step length- Right, decreased stride length, decreased hip/knee flexion- Right, decreased ankle dorsiflexion- Right, Left hip hike, lateral lean- Left, and poor foot clearance- Right Distance walked: Various clinic distances  Assistive device utilized: Walker - 2 wheeled Level of assistance: CGA and Min A Comments: Pt not wearing AFO this date. Noted pt dragging RLE on ground, especially w/fatigue, and would lose  balance anteriorly requiring min A to stabilize. Pt also vaults to progress RLE and frequently tips RW over on L side.   VITALS Vitals:   11/05/22 1026  BP: 127/72  Pulse: 86      TODAY'S TREATMENT:       Ther Act  Assessed vitals (see above) and WNL for therapy   E-stim (attended)  Set up Bioness to R anterior tib and hamstrings for improved ankle DF and knee flexion w/gait. See tablet 1 for details    Gait Training/NMR  Gait pattern:  ER of RLE, step to pattern, decreased step length- Right, decreased stride length, decreased hip/knee flexion- Right, decreased ankle dorsiflexion- Right, lateral lean- Left, wide BOS, and poor foot clearance- Right Distance walked: >350'  Assistive device utilized: Walker - 2 wheeled Level of assistance: SBA Comments: Noted increased knee flexion and step clearance this date w/reduced ER of RLE. As pt fatigued, increased intensity of Bioness to hamstrings. Noted maintained knee flexion w/stance this date, but no buckling noted and pt denied feeling unstable.   At ballet bar w/BUE support, attempted lateral step taps w/RLE to 6" step w/Bioness on exercise mode (5s on, 7s off). Pt able to perform 5 reps prior to fatiguing. Noted increased tremor in RUE this date which was relayed to OT.  Donned AFO at end of session w/max A.                                                                                                                       PATIENT EDUCATION: Education details: Continue working on sit to stands at home, plan for next session ( pt requesting to end early as he has another appointment after PT)  Person educated: Patient Education method: Explanation Education comprehension: verbalized understanding  HOME EXERCISE PROGRAM: To be established   GOALS: Goals reviewed with patient? Yes  SHORT TERM GOALS: Target date: 10/28/2022   Pt will perform initial HEP w/min A from caregiver for improved strength, balance, transfers and gait.  Baseline: to be established  Goal status: IN PROGRESS  2.  Pt will improve gait velocity to at least 0.7 ft/s w/RW and CGA for improved gait efficiency and independence   Baseline: 0.49 ft/s w/RW and min A; 0.66 ft/s w/RW and SBA Goal status: PARTIALLY MET   3.  Pt will be able to don and doff R hand orthotic on RW w/CGA for improved independence and weight shift to R side  Baseline: max A; min A; SBA (10/3)  Goal status: MET   4.  Pt will ambulate greater than or equal to 300 feet on with LRAD and CGA for improved cardiovascular endurance and BLE strength.  Baseline: 213 ft with RW and min A (9/17); 195' w/RW and SBA  Goal status: PARTIALLY MET   5.  Berg to be assessed and LTG written  Baseline: 25/56 Goal status: MET  6.  Pt will perform supine > sit EOB w/min A or use of rail for  improved independence and functional strength Baseline: mod-max A; min A (9/26) Goal status: MET  LONG TERM GOALS: Target date: 11/25/2022   Pt will be independent with final HEP for improved strength, balance, transfers and gait.  Baseline:  Goal status: INITIAL  2.  Pt will improve gait velocity to at least 1.0 ft/s w/LRAD and SBA for improved gait efficiency and independence   Baseline:  Goal status: INITIAL  3.  Pt will ambulate greater than or equal to 400 feet on with LRAD and CGA for improved  cardiovascular endurance and BLE strength.   Baseline: 213 ft with RW and min A (9/17)  Goal status: INITIAL  4.  Pt will improve Berg score to 34/56 for decreased fall risk  Baseline: 25/56 Goal status: REVISED  5.  Pt will perform bed mobility at CGA or SBA level for improved independence at home  Baseline: mod-max A Goal status: INITIAL   ASSESSMENT:  CLINICAL IMPRESSION: Emphasis of skilled PT session on gait training w/Bioness for improved ankle DF and knee flexion w/gait. Pt demonstrates improved R knee flexion and step clearance this date w/reduced lateral lean to L side and circumduction compensation both with and without Bioness, but more notable when using Bioness. Continue POC.  OBJECTIVE IMPAIRMENTS: Abnormal gait, decreased activity tolerance, decreased balance, decreased coordination, decreased mobility, difficulty walking, decreased strength, impaired tone, impaired UE functional use, improper body mechanics, and pain.   ACTIVITY LIMITATIONS: carrying, lifting, standing, squatting, stairs, transfers, bed mobility, bathing, toileting, dressing, self feeding, reach over head, hygiene/grooming, locomotion level, and caring for others  PARTICIPATION LIMITATIONS: meal prep, cleaning, laundry, medication management, personal finances, interpersonal relationship, driving, shopping, community activity, and yard work  PERSONAL FACTORS: Fitness, Past/current experiences, Transportation, and 1 comorbidity: CVA  are also affecting patient's functional outcome.   REHAB POTENTIAL: Good  CLINICAL DECISION MAKING: Evolving/moderate complexity  EVALUATION COMPLEXITY: Moderate  PLAN:  PT FREQUENCY: 2x/week  PT DURATION: 8 weeks (POC written for 10 weeks due to delay in scheduling)   PLANNED INTERVENTIONS: Therapeutic exercises, Therapeutic activity, Neuromuscular re-education, Balance training, Gait training, Patient/Family education, Self Care, Joint mobilization, Stair training,  Orthotic/Fit training, DME instructions, Aquatic Therapy, Dry Needling, Electrical stimulation, Manual therapy, and Re-evaluation  PLAN FOR NEXT SESSION:  continue with Bioness to R HS and ant tib; RLE NMR, weight shift to R side, work on bed mobility and donning/doffing RUE from hand orthotic and donning/doffing AFO more independently    Josephine Igo, PT, DPT [facility] [address] [place], [place]  [address] Phone:  [phone] Fax:  [phone]  11/05/2022, 11:23 AM

## 2022-11-05 NOTE — Therapy (Signed)
OUTPATIENT OCCUPATIONAL THERAPY NEURO TREATMENT  Patient Name: Jimmy Valentine MRN: [id number] DOB:May 30, 1950, 72 y.o., male Today's Date: 11/05/2022  PCP: Wanda Plump, MD REFERRING PROVIDER: Wanda Plump, MD  END OF SESSION:  OT End of Session - 11/05/22 1110     Visit Number 7    Number of Visits 16   + evaluaiton   Date for OT Re-Evaluation 12/11/22    Authorization Type [employer] MC 2024 Met VL: [place] Auth Not Reqd    OT Start Time 1105    OT Stop Time 1150    OT Time Calculation (min) 45 min    Equipment Utilized During Treatment cones, estim    Activity Tolerance Patient tolerated treatment well    Behavior During Therapy WFL for tasks assessed/performed              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,had a MRI, was prescribed testosterone by endocrinology   Sleep apnea    does't use cpap   Sudden hearing loss, left 2017   idiopathic sensorineural hearing loss   Past Surgical History:  Procedure Laterality Date   TOTAL KNEE ARTHROPLASTY Right 2005   TOTAL KNEE ARTHROPLASTY  12/04/2011   LEFT-- TOTAL KNEE ARTHROPLASTY;  Surgeon: Eugenia Mcalpine, MD;  Location: [facility];  Service: Orthopedics;  Laterality: Left;   Patient Active Problem List   Diagnosis Date Noted   Hemiparesis affecting right side as late effect of cerebrovascular accident (CVA) (HCC) 09/04/2022   Gait disturbance, post-stroke 09/04/2022   Hyponatremia 07/18/2022   Aphasia due to acute cerebrovascular accident (CVA) (HCC) 07/15/2022   Acute ischemic left MCA stroke (HCC) 06/24/2022   Stroke (cerebrum) (HCC) 06/21/2022   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 10/18/2014   Elevated LFTs 04/13/2014   Diabetes (HCC) 09/03/2011   Gout 09/03/2011   OSA (obstructive sleep apnea) 04/22/2011   Annual physical exam 12/04/2010   Hypertension    Hyperlipidemia    Hypogonadism male    Erectile dysfunction     ONSET DATE: Referral date: 10/01/2022  Stroke 06/21/22  REFERRING DIAG: I69.351 (ICD-10-CM) - Hemiplegia and hemiparesis following cerebral infarction affecting right dominant side (HCC)  THERAPY DIAG:   Hemiplegia and hemiparesis following cerebral infarction affecting right dominant side (HCC)  Muscle weakness (generalized)  Other lack of coordination  Subluxation of acromioclavicular joint, right, subsequent encounter  Rationale for Evaluation and Treatment: Rehabilitation  SUBJECTIVE:   SUBJECTIVE STATEMENT:  Pt brought his splint for OTR today.  He reports difficulty with getting it on himself though.  Pt accompanied by: self  PERTINENT HISTORY:  Presented to [facility] ED on 06/21/2022 with sudden onset of right-sided weakness, dysarthria and facial drooping. MRI showed large MCA territory basal ganglia infarct with petechial hemorrhage with no hemorrhagic transformation or mass effect.   Inpatient Rehab: 06/24/2022 - 07/30/2022 Patient discharged at [facility] level.  Patient's care partners able to provide the necessary physical assistance at discharge.   Recommendation:  Patient will benefit from ongoing skilled OT services in home health setting to continue to advance functional skills  in the area of BADL, iADL, and Reduce care partner burden. Equipment: Shower chair, commode, RW, R RW hand splint, R resting hand splint, LH sponge, Dycem   Home Health therapies 07/24 - 08/24     PRECAUTIONS: Fall  WEIGHT BEARING RESTRICTIONS: No  PAIN:  Are you having pain? No Some in L arm/shoulder from pulling up in the bed  FALLS: Has patient fallen in last 6 months? Yes. Number of falls 1x - fell on his  bottom about a month ago - got the walker stuck on the doorframe  LIVING ENVIRONMENT: Lives with: lives alone and Caregiver 3-4 days/week x 12 hours, daughter, son and brother-in-law assist on other days Lives in: House/apartment Stairs: Yes: External: 3-5 steps; can reach both Has following equipment at home: Environmental consultant - 2 wheeled, Wheelchair (manual), Shower bench, bed side commode, Grab bars, and urinal  PLOF: Independent - still driving, played golf, retired Immunologist with lots traveling (driving)  PATIENT GOALS: To get my right side back working again  OBJECTIVE:   HAND DOMINANCE: Right (affected side)  ADLs: Overall ADLs: Min to mod assistance Transfers/ambulation related to ADLs: Supervision Eating: Gets help to cut food as needed, having to use L hand Grooming: Used regular razor yesterday for the first time UB Dressing: Needs help to get R arm in sleeve LB Dressing: Can pull pants up most of the time Toileting: Most of the time he is able to perform on his own; uses urinal at bedside at night Bathing: "50/50" with caregiver Tub Shower transfers: Gets help to get his leg into the tub Equipment: Transfer tub bench, Grab bars, bed side commode, and Long handled sponge  IADLs: Shopping: caregiver and family (daughter) Light housekeeping: Caregiver Meal Prep: Psychologist, counselling mobility: RW with hand orthosis & dycem + strap for R hand Medication management: Assistance from caregivers Financial management: Assistance from family Handwriting: unable with R UE; not assessed with L UE  MOBILITY STATUS: Needs Assist: Help to get his R UE/hand on the handle of his walker and Hx of falls  POSTURE COMMENTS:  rounded shoulders and forward head Sitting balance:  WFL in arm chair at table  ACTIVITY TOLERANCE: Activity tolerance: Limited due to R hemiplegia  FUNCTIONAL OUTCOME MEASURES: PSFS - 1.0  UPPER EXTREMITY ROM:    Active ROM Right eval Left eval   Shoulder flexion  [facility]  Shoulder abduction    Shoulder adduction    Shoulder extension min   Shoulder internal rotation    Shoulder external rotation    Elbow flexion min   Elbow extension    Wrist flexion min   Wrist extension    Wrist ulnar deviation    Wrist radial deviation    Wrist pronation    Wrist supination    (Blank rows = not tested)  UPPER EXTREMITY MMT:     MMT Right eval Left eval  Shoulder flexion  4/5  Shoulder abduction    Shoulder adduction    Shoulder extension    Shoulder internal rotation    Shoulder external rotation    Middle trapezius    Lower trapezius    Elbow flexion    Elbow extension    Wrist flexion    Wrist extension    Wrist ulnar deviation    Wrist radial deviation    Wrist pronation    Wrist supination    (Blank rows = not tested)  HAND FUNCTION: Grip strength: Right: 3.9, 3.7, 5.2 lbs; Left: 34.8, 36.5, 37.9  lbs Average: Right 4.3 lbs Left 36.4 lbs  COORDINATION: Unable to perform R UE coordinaiton tasks,   SENSATION: WFL  EDEMA: Slight RUE  MUSCLE TONE: RUE: Hypotonic and Flaccid  COGNITION: Overall cognitive status: Within functional limits for tasks assessed and h/o more severe aphasia and does self-report need to think about what he is trying to say etc  VISION: Subjective report: Eye Dr ~ 3 weeks Baseline vision: Wears glasses for reading only Visual history: cataracts  VISION ASSESSMENT: TBA  Patient is not having difficulty with activities due to vision.  PERCEPTION: Not tested  PRAXIS: Not tested  OBSERVATIONS: Pt ambulated into clinic w/RW and R hand orthosis. Patient was wearing his AFO which he stated he stopped wearing after he finished home health therapy.   TODAY'S TREATMENT:                                                                                                                               Orthotic management Patient had splint covered modified by OTR via additional velcro hoop sewn  to 3 specific areas of splint to allow straps to hold in place better with good success when cover was reapplied to his splint.  Pt practiced donning splint but working with splint turned on its side so he could get his thumb in place and then fasten straps.  NMES OT educated patient on use of  estim/NMES to encourage improved nerve function as needed to produce more active RUE wrist/digital flexion. Therapist educated patient on application for RUE wrist and digital flexion and applied electrodes accordingly. Therapist utilized NMES at same setting as previous session -- channel 1, Small, Custom: Time: 10 minutes, On time: 10 s, Off time: 8 s, Ramp 2s, Rate 50 Hz, Sym waveform. Patient tolerating up to 24 intensity with supervised application to promote improved AROM and pain management. Therapist assisted pt to complete AROM grasp with RUE during NMES treatment.  Neuro Re-Ed Neuromuscular reeducation continuing to include specific visual, tactile and verbal cues and assistance to stimulate the neuromuscular system and promote functional movement in RUE during application of NMES to grasp and release cones, lift arm to stack cones and to move hand/arm on table top.  OT provided RUE support at elbow and wrist to promote correct positioning and act like a Elevating Mobile Arm Support for hand to face motions, picking up cones and further R UE AAROM motions.     PATIENT EDUCATION: Education details: AAROM Person educated: Patient Education method: Explanation, Demonstration, Tactile cues, and Verbal cues Education comprehension: verbalized understanding, returned demonstration, verbal cues required, tactile cues required, and needs further education  HOME EXERCISE PROGRAM:  10/20/22 - UE ROM (grip, forearm, elbow flex, shoulder shrugs) - no images provided only written suggestions. 10/27/2022: updated ROM HEP Access Code [email or web address] 11/03/22 - sleep positioning handout provided  GOALS: Goals reviewed  with patient? Yes  SHORT TERM GOALS: Target date: 11/13/22  Patient will demonstrate updated  RUE HEP with 25% verbal cues or less for proper execution.  Baseline: New to outpatient OT.  Self reported difficulty with motivation at home Goal status: IN progress  2.  Patient will demonstrate at least 8+ lbs RUE grip strength as needed to stabilize container on tabletop/hold large handled object.  Baseline: Right 4.3 lbs Goal status: IN progress  3.  Patient will be able to don T-shirt without physical assistance x 5 days in a row. Baseline: Caregiver assistance to get R UE in sleeve Goal status: INITIAL  4.  Patient will improve R UE AROM for shoulder elevation and elbow flexion to lift arm/hand to walker.  Baseline: Caregiver assist or uses LUE.  Trace elbow/shoulder movement Goal status: IN Progress  5.  Patient will be assisted to don/doff appropriate splints/positioning devices to minimize subluxation and minimize risk of RUE/hand contracture. Baseline: None place Goal status: IN Progress   LONG TERM GOALS: Target date: 12/11/22  Patient will demonstrate updated RUE HEP with visual schedule and images for proper execution and frequency. Baseline: New to outpatient OT.  Self reported difficulty with motivation at home. Goal status: IN Progress  2.  Patient will demonstrate at least 10 lbs RUE grip strength as needed to hold objects for manipulation by LUE. Baseline: Right 4.3 lbs Goal status: IN Progress  3.  Patient will be able to get to EOB with no physical assistance to toilet with BSC as needed. Baseline: Assistance to get to EOB from caregivers. Goal status: IN Progress  4.  Patient will be able to prepare simple food items with appropriate AE (small crock pot, microwave, toaster oven etc) Baseline: Caregiver/family perform meal prep  Goal status: INITIAL  5.  Patient will report at least two-point increase in average PSFS score or at least three-point increase in a  single activity score indicating functionally significant improvement given minimum detectable change. Baseline: 1.0 total score (See above for individual activity scores)  Goal status: INITIAL   ASSESSMENT:  CLINICAL IMPRESSION: Patient is a 72 y.o. male who was seen today for occupational therapy treatment for left MCA stroke with RUE weakness/hemiparesis. Pt responded well to NMES R wrist ext and neuro re-ed for RUE A/AROM activities which involved weightbearing through R hand.  Pt will continue to benefit from trials of NMES for R UE to improve neuromuscular return and functional use of RUE.  He will benefit from continued skilled OT services in the outpatient setting to work on RUE impairments to help pt return to higher independence, safety and as modified independence as able.      PERFORMANCE DEFICITS: in functional skills including ADLs, coordination, dexterity, proprioception, sensation, tone, ROM, strength, flexibility, Fine motor control, Gross motor control, mobility, balance, endurance, decreased knowledge of use of DME, skin integrity, and UE functional use, cognitive skills including energy/drive, safety awareness, and sequencing, and psychosocial skills including coping strategies, environmental adaptation, and routines and behaviors.   IMPAIRMENTS: are limiting patient from ADLs, IADLs, rest and sleep, leisure, and social participation.   CO-MORBIDITIES: may have co-morbidities  that affects occupational performance. Patient will benefit from skilled OT to address above impairments and improve overall function.  REHAB POTENTIAL: Good   PLAN:  OT FREQUENCY: 1-2x/week  OT DURATION: 8 weeks  PLANNED INTERVENTIONS: self care/ADL training, therapeutic exercise, therapeutic activity, neuromuscular re-education, manual therapy, passive range of motion, balance training, functional mobility training, aquatic therapy, splinting, electrical stimulation, patient/family education,  cognitive remediation/compensation, visual/perceptual remediation/compensation, energy conservation, coping strategies training, and DME  and/or AE instructions  RECOMMENDED OTHER SERVICES: PT evaluation already completed.  CONSULTED AND AGREED WITH PLAN OF CARE: Patient and family member/caregiver  PLAN FOR NEXT SESSION:   Continue E-stim protocols for R UE AAROM as tolerated Ensure good success with splinting needs/PRN modifications  UB dressing education for I carryover ADL dressing trials and IADLS (cooking)  Supportive RUE treatment options  Victorino Sparrow, OT 11/05/2022, 3:29 PM

## 2022-11-10 ENCOUNTER — Ambulatory Visit: Payer: Medicare HMO | Admitting: Physical Therapy

## 2022-11-10 ENCOUNTER — Ambulatory Visit: Payer: Medicare HMO | Admitting: Occupational Therapy

## 2022-11-10 DIAGNOSIS — E119 Type 2 diabetes mellitus without complications: Secondary | ICD-10-CM | POA: Diagnosis not present

## 2022-11-10 LAB — HM DIABETES EYE EXAM

## 2022-11-12 ENCOUNTER — Ambulatory Visit: Payer: Medicare HMO | Admitting: Occupational Therapy

## 2022-11-12 ENCOUNTER — Ambulatory Visit: Payer: Medicare HMO | Admitting: Physical Therapy

## 2022-11-12 VITALS — BP 124/66 | HR 51

## 2022-11-12 DIAGNOSIS — I69351 Hemiplegia and hemiparesis following cerebral infarction affecting right dominant side: Secondary | ICD-10-CM

## 2022-11-12 DIAGNOSIS — R2689 Other abnormalities of gait and mobility: Secondary | ICD-10-CM

## 2022-11-12 DIAGNOSIS — R278 Other lack of coordination: Secondary | ICD-10-CM

## 2022-11-12 DIAGNOSIS — R2681 Unsteadiness on feet: Secondary | ICD-10-CM | POA: Diagnosis not present

## 2022-11-12 DIAGNOSIS — M6281 Muscle weakness (generalized): Secondary | ICD-10-CM

## 2022-11-12 NOTE — Therapy (Signed)
OUTPATIENT PHYSICAL THERAPY NEURO TREATMENT   Patient Name: Jimmy Valentine MRN: [id number] DOB:Mar 29, 1950, 72 y.o., male Today's Date: 11/12/2022   PCP: Wanda Plump, MD REFERRING PROVIDER: Wanda Plump, MD  END OF SESSION:  PT End of Session - 11/12/22 1021     Visit Number 8    Number of Visits 17   Plus eval   Date for PT Re-Evaluation 12/09/22    Authorization Type [employer]    PT Start Time 1018    PT Stop Time 1100    PT Time Calculation (min) 42 min    Equipment Utilized During Treatment Gait belt    Activity Tolerance Patient tolerated treatment well    Behavior During Therapy WFL for tasks assessed/performed                  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,had a MRI, was prescribed testosterone by endocrinology   Sleep apnea    does't use cpap   Sudden hearing loss, left 2017   idiopathic sensorineural hearing loss   Past Surgical History:  Procedure Laterality Date   TOTAL KNEE ARTHROPLASTY Right 2005   TOTAL KNEE ARTHROPLASTY  12/04/2011   LEFT-- TOTAL KNEE ARTHROPLASTY;  Surgeon: Eugenia Mcalpine, MD;  Location: [facility];  Service: Orthopedics;  Laterality: Left;   Patient Active Problem List   Diagnosis Date Noted   Hemiparesis affecting right side as late effect of cerebrovascular accident (CVA) (HCC) 09/04/2022   Gait disturbance, post-stroke 09/04/2022   Hyponatremia 07/18/2022   Aphasia due to acute cerebrovascular accident (CVA) (HCC) 07/15/2022   Acute ischemic left MCA stroke (HCC) 06/24/2022   Stroke (cerebrum) (HCC) 06/21/2022   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 10/18/2014   Elevated LFTs 04/13/2014   Diabetes (HCC) 09/03/2011   Gout 09/03/2011   OSA (obstructive sleep apnea) 04/22/2011   Annual physical exam 12/04/2010   Hypertension    Hyperlipidemia    Hypogonadism male    Erectile dysfunction     ONSET DATE: 09/23/2022 (referral)   REFERRING DIAG: K44.010 (ICD-10-CM) - Hemiparesis affecting right side as late effect of cerebrovascular accident (CVA) (HCC)  THERAPY DIAG:  Hemiplegia and hemiparesis following cerebral infarction affecting right dominant side (HCC)  Other abnormalities of gait and mobility  Muscle weakness (generalized)  Rationale for Evaluation and Treatment: Rehabilitation  SUBJECTIVE:  SUBJECTIVE STATEMENT: Pt reports he did not feel well Tuesday, thought he cancelled his therapy appointments. Denies falls. Having some sciatic pain today on L side   Pt accompanied by:  Caregiver, [name] (in lobby)   PERTINENT HISTORY: L MCA CVA in May 2024   PAIN:  Are you having pain? Yes: NPRS scale: 3/10 Pain location: L sciatica Pain description: Achy/throbbing   PRECAUTIONS: Fall  WEIGHT BEARING RESTRICTIONS: No  FALLS: Has patient fallen in last 6 months? Yes. Number of falls 1  LIVING ENVIRONMENT: Lives with: lives alone Lives in: House/apartment Stairs: Yes: External: 3 steps; on right going up, on left going up, and can reach both Has following equipment at home: Walker - 2 wheeled, Wheelchair (manual), shower chair, bed side commode, and AFO  PLOF: Independent  PATIENT GOALS: "Get back to where I can walk by myself without the walker and without assistance"   OBJECTIVE:   DIAGNOSTIC FINDINGS: MRI of brain on 06/22/22  IMPRESSION: 1. Positive for Left MCA territory lacunar type infarct tracking from  the left corona radiata to the lentiform. Petechial hemorrhage (Heidelberg classification 1b: HI2). No malignant hemorrhagic transformation. No significant mass effect.   2. No other acute intracranial abnormality. 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  CT angio of head/neck on 06/21/22 IMPRESSION: 1. 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 The left MCA bi/trifurcation and left M2 branches remain patent.   2. Superimposed Severe intracranial and cervical ICA Atherosclerosis resulting in: - distal Right Vertebral Artery (V4) OCCLUSION. - distal Left Vertebral Artery Severe (V4) stenosis. - Right ICA origin RADIOGRAPHIC STRING SIGN stenosis due to calcified plaque. - Left ICA origin stenosis due to complex plaque estimated 65-70%. - Severe Left ICA supraclinoid stenosis due to calcified plaque.  COGNITION: Overall cognitive status: Within functional limits for tasks assessed   SENSATION: Pt denies numbness/tingling in RLE/RUE   COORDINATION: Heel to shin test: WNL on LLE, unable to perform hip flexion on RLE but able to achieve cross-legged position    MUSCLE TONE: Mild clonus in R foot that fatigues after 3s   POSTURE: rounded shoulders and forward head   LOWER EXTREMITY MMT:  Tested in seated position   MMT Right Eval Left Eval  Hip flexion 2- 4+  Hip extension    Hip abduction 1 4+  Hip adduction 2 4+  Hip internal rotation    Hip external rotation    Knee flexion 3 4+  Knee extension 2 4+  Ankle dorsiflexion 4- 4+  Ankle plantarflexion    Ankle inversion    Ankle eversion    (Blank rows = not tested)  BED MOBILITY:  Has a rail but requires assistance (up to max) to roll and perform sit <>supine   TRANSFERS: Assistive  device utilized: Environmental consultant - 2 wheeled  Sit to stand: SBA Stand to sit: SBA Pt kickstands RLE and leans to L side to perform    GAIT: Gait pattern: step to pattern, decreased step length- Right, decreased stride length, decreased hip/knee flexion- Right, decreased ankle dorsiflexion- Right, Left hip hike, lateral lean- Left, and poor foot clearance- Right Distance walked: Various clinic distances  Assistive device utilized: Walker - 2 wheeled Level of assistance: CGA and Min A Comments: Pt not wearing AFO this date. Noted pt dragging RLE  on ground, especially w/fatigue, and would lose balance anteriorly requiring min A to stabilize. Pt also vaults to progress RLE and frequently tips RW over on L side.   VITALS Vitals:   11/12/22 1026 11/12/22 1057  BP: (!) 155/79 124/66  Pulse: 95 (!) 51      TODAY'S TREATMENT:       Ther Act  Assessed vitals (see above) and WNL for therapy    NMR  Pt performed sit > stand > tall kneel to mat w/min A for RUE and RLE management. Used bench on second to tallest height to provide stability:  Pt performed x12 mini squats w/min cues to facilitate hip hinge and obtain full hip extension at top of movement. Pt able to bear weight through RUE w/activity. Pt did report pain in bilateral quads w/activity due to hip tightness, so pt performed tall kneel to stand w/min A for RLE management and took seated rest break After break, performed tall kneel transition again w/light min A for RLE management. Attempted side stepping w/LLE first and pt unable to stabilize on R knee, resulting in anterior LOB into bench and requiring max A to get into seated position. Pt did scrape anterior tib of LLE, requiring light first aid and bandaid to be applied but was able to continue with session.   Ther Ex  Performed seated piriformis stretch on L side and added to HEP (see bolded below) for improved hip mobility and pain modulation. Also added supine figure-4 stretch for pt to  perform if this is easier for him at home, as he did need min A to bring L ankle over R knee to perform seated stretch.                                                                                                                     PATIENT EDUCATION: Education details: Potential to try rolling to prone next session to work on anterior hip mobility, additions to HEP Person educated: Patient Education method: Programmer, multimedia, Demonstration, and Handouts Education comprehension: verbalized understanding and returned demonstration  HOME EXERCISE PROGRAM: Access Code: [email or web address] URL: [email or web address] Date: 11/12/2022 Prepared by: Alethia Berthold Maisee Vollman  Exercises - Supine Figure 4 Piriformis Stretch  - 1 x daily - 7 x weekly - 3 sets - 1-3 minute hold - Seated Piriformis Stretch  - 1 x daily - 7 x weekly - 3 sets - 1-3 minute hold  GOALS: Goals reviewed with patient? Yes  SHORT TERM GOALS: Target date: 10/28/2022   Pt will perform initial HEP w/min A from caregiver for improved strength, balance, transfers and gait.  Baseline: to be established  Goal status: IN PROGRESS  2.  Pt will improve gait velocity to at least 0.7 ft/s w/RW and CGA for improved gait efficiency and independence   Baseline: 0.49 ft/s w/RW and min A; 0.66 ft/s w/RW and SBA Goal status: PARTIALLY MET   3.  Pt will be able to don and doff R hand orthotic on RW w/CGA for  improved independence and weight shift to R side  Baseline: max A; min A; SBA (10/3)  Goal status: MET   4.  Pt will ambulate greater than or equal to 300 feet on with LRAD and CGA for improved cardiovascular endurance and BLE strength.  Baseline: 213 ft with RW and min A (9/17); 195' w/RW and SBA  Goal status: PARTIALLY MET   5.  Berg to be assessed and LTG written  Baseline: 25/56 Goal status: MET  6.  Pt will perform supine > sit EOB w/min A or use of rail for improved independence and functional strength Baseline: mod-max  A; min A (9/26) Goal status: MET  LONG TERM GOALS: Target date: 11/25/2022   Pt will be independent with final HEP for improved strength, balance, transfers and gait.  Baseline:  Goal status: INITIAL  2.  Pt will improve gait velocity to at least 1.0 ft/s w/LRAD and SBA for improved gait efficiency and independence   Baseline:  Goal status: INITIAL  3.  Pt will ambulate greater than or equal to 400 feet on with LRAD and CGA for improved cardiovascular endurance and BLE strength.   Baseline: 213 ft with RW and min A (9/17)  Goal status: INITIAL  4.  Pt will improve Berg score to 34/56 for decreased fall risk  Baseline: 25/56 Goal status: REVISED  5.  Pt will perform bed mobility at CGA or SBA level for improved independence at home  Baseline: mod-max A Goal status: INITIAL   ASSESSMENT:  CLINICAL IMPRESSION: Emphasis of skilled PT session on hip strength, hip mobility and posterior chain strength. Pt tolerated session well but did require single instance of max A to perform tall kneel to sitting due to leg weakness and fatigue. Pt will benefit from continued practice in tall kneel for functional hip strengthening, weight shift to R side and mobility. Continue POC.  OBJECTIVE IMPAIRMENTS: Abnormal gait, decreased activity tolerance, decreased balance, decreased coordination, decreased mobility, difficulty walking, decreased strength, impaired tone, impaired UE functional use, improper body mechanics, and pain.   ACTIVITY LIMITATIONS: carrying, lifting, standing, squatting, stairs, transfers, bed mobility, bathing, toileting, dressing, self feeding, reach over head, hygiene/grooming, locomotion level, and caring for others  PARTICIPATION LIMITATIONS: meal prep, cleaning, laundry, medication management, personal finances, interpersonal relationship, driving, shopping, community activity, and yard work  PERSONAL FACTORS: Fitness, Past/current experiences, Transportation, and  1 comorbidity: CVA  are also affecting patient's functional outcome.   REHAB POTENTIAL: Good  CLINICAL DECISION MAKING: Evolving/moderate complexity  EVALUATION COMPLEXITY: Moderate  PLAN:  PT FREQUENCY: 2x/week  PT DURATION: 8 weeks (POC written for 10 weeks due to delay in scheduling)   PLANNED INTERVENTIONS: Therapeutic exercises, Therapeutic activity, Neuromuscular re-education, Balance training, Gait training, Patient/Family education, Self Care, Joint mobilization, Stair training, Orthotic/Fit training, DME instructions, Aquatic Therapy, Dry Needling, Electrical stimulation, Manual therapy, and Re-evaluation  PLAN FOR NEXT SESSION:  Will need +2 if doing tall kneel activities. Maybe try rolling to prone? continue with Bioness to R HS and ant tib; RLE NMR, weight shift to R side, work on bed mobility and donning/doffing RUE from hand orthotic and donning/doffing AFO more independently    Josephine Igo, PT, DPT [facility] [address] [place], [place]  [address] Phone:  [phone] Fax:  [phone]  11/12/2022, 11:25 AM

## 2022-11-12 NOTE — Therapy (Signed)
OUTPATIENT OCCUPATIONAL THERAPY NEURO TREATMENT  Patient Name: Jimmy Valentine MRN: [id number] DOB:November 20, 1950, 72 y.o., male Today's Date: 11/12/2022  PCP: Wanda Plump, MD REFERRING PROVIDER: Wanda Plump, MD  END OF SESSION:  OT End of Session - 11/12/22 1107     Visit Number 8    Number of Visits 16   + evaluaiton   Date for OT Re-Evaluation 12/11/22    Authorization Type [employer] MC 2024 Met VL: [place] Auth Not Reqd    OT Start Time 1105    OT Stop Time 1145    OT Time Calculation (min) 40 min    Equipment Utilized During Treatment blocks, estim    Activity Tolerance Patient tolerated treatment well    Behavior During Therapy WFL for tasks assessed/performed              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,had a MRI, was prescribed testosterone by endocrinology   Sleep apnea    does't use cpap   Sudden hearing loss, left 2017   idiopathic sensorineural hearing loss   Past Surgical History:  Procedure Laterality Date   TOTAL KNEE ARTHROPLASTY Right 2005   TOTAL KNEE ARTHROPLASTY  12/04/2011   LEFT-- TOTAL KNEE ARTHROPLASTY;  Surgeon: Eugenia Mcalpine, MD;  Location: [facility];  Service: Orthopedics;  Laterality: Left;   Patient Active Problem List   Diagnosis Date Noted   Hemiparesis affecting right side as late effect of cerebrovascular accident (CVA) (HCC) 09/04/2022   Gait disturbance, post-stroke 09/04/2022   Hyponatremia 07/18/2022   Aphasia due to acute cerebrovascular accident (CVA) (HCC) 07/15/2022   Acute ischemic left MCA stroke (HCC) 06/24/2022   Stroke (cerebrum) (HCC) 06/21/2022   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 10/18/2014   Elevated LFTs 04/13/2014   Diabetes (HCC) 09/03/2011   Gout 09/03/2011   OSA (obstructive sleep apnea) 04/22/2011   Annual physical exam 12/04/2010   Hypertension    Hyperlipidemia    Hypogonadism male    Erectile dysfunction     ONSET DATE: Referral date: 10/01/2022  Stroke 06/21/22  REFERRING DIAG: I69.351 (ICD-10-CM) - Hemiplegia and hemiparesis following cerebral infarction affecting right dominant side (HCC)  THERAPY DIAG:   Hemiplegia and hemiparesis following cerebral infarction affecting right dominant side (HCC)  Muscle weakness (generalized)  Other lack of coordination  Rationale for Evaluation and Treatment: Rehabilitation  SUBJECTIVE:   SUBJECTIVE STATEMENT:  Pt reported that the estim felt good last session.   Pt accompanied by: self  PERTINENT HISTORY:  Presented to [facility] ED on 06/21/2022 with sudden onset of right-sided weakness, dysarthria and facial drooping. MRI showed large MCA territory basal ganglia infarct with petechial hemorrhage with no hemorrhagic transformation or mass effect.   Inpatient Rehab: 06/24/2022 - 07/30/2022 Patient discharged at [facility] Assist level.  Patient's care partners able to provide the necessary physical assistance at discharge.   Recommendation:  Patient will benefit from ongoing skilled OT services in home health setting to continue to advance functional skills in the area of BADL, iADL, and Reduce care partner burden. Equipment: Paediatric nurse, commode,  RW, R RW hand splint, R resting hand splint, LH sponge, Dycem   Home Health therapies 07/24 - 08/24     PRECAUTIONS: Fall  WEIGHT BEARING RESTRICTIONS: No  PAIN:  Are you having pain? No Some in L arm/shoulder is not as tender from pulling up in the bed  FALLS: Has patient fallen in last 6 months? Yes. Number of falls 1x - fell on his bottom about a month ago - got the walker stuck on the doorframe  LIVING  ENVIRONMENT: Lives with: lives alone and Caregiver 3-4 days/week x 12 hours, daughter, son and brother-in-law assist on other days Lives in: House/apartment Stairs: Yes: External: 3-5 steps; can reach both Has following equipment at home: Environmental consultant - 2 wheeled, Wheelchair (manual), Shower bench, bed side commode, Grab bars, and urinal  PLOF: Independent - still driving, played golf, retired Immunologist with lots traveling (driving)  PATIENT GOALS: To get my right side back working again  OBJECTIVE:   HAND DOMINANCE: Right (affected side)  ADLs: Overall ADLs: Min to mod assistance Transfers/ambulation related to ADLs: Supervision Eating: Gets help to cut food as needed, having to use L hand Grooming: Used regular razor yesterday for the first time UB Dressing: Needs help to get R arm in sleeve LB Dressing: Can pull pants up most of the time Toileting: Most of the time he is able to perform on his own; uses urinal at bedside at night Bathing: "50/50" with caregiver Tub Shower transfers: Gets help to get his leg into the tub Equipment: Transfer tub bench, Grab bars, bed side commode, and Long handled sponge  IADLs: Shopping: caregiver and family (daughter) Light housekeeping: Caregiver Meal Prep: Psychologist, counselling mobility: RW with hand orthosis & dycem + strap for R hand Medication management: Assistance from caregivers Financial management: Assistance from family Handwriting: unable with R UE; not assessed with L UE  MOBILITY STATUS: Needs Assist: Help to get his R UE/hand on the handle of his walker and Hx of falls  POSTURE COMMENTS:  rounded shoulders and forward head Sitting balance:  WFL in arm chair at table  ACTIVITY TOLERANCE: Activity tolerance: Limited due to R hemiplegia  FUNCTIONAL OUTCOME MEASURES: PSFS - 1.0  UPPER EXTREMITY ROM:    Active ROM Right eval Left eval  Shoulder flexion  [facility]  Shoulder abduction    Shoulder adduction    Shoulder  extension min   Shoulder internal rotation    Shoulder external rotation    Elbow flexion min   Elbow extension    Wrist flexion min   Wrist extension    Wrist ulnar deviation    Wrist radial deviation    Wrist pronation    Wrist supination    (Blank rows = not tested)  UPPER EXTREMITY MMT:     MMT Right eval Left eval  Shoulder flexion  4/5  Shoulder abduction    Shoulder adduction    Shoulder extension    Shoulder internal rotation    Shoulder external rotation    Middle trapezius    Lower trapezius    Elbow flexion    Elbow extension    Wrist flexion    Wrist extension    Wrist ulnar deviation    Wrist radial deviation    Wrist pronation    Wrist supination    (Blank rows = not tested)  HAND FUNCTION: Grip strength: Right: 3.9, 3.7, 5.2 lbs; Left: 34.8, 36.5, 37.9 lbs Average: Right 4.3 lbs Left 36.4 lbs  COORDINATION: Unable  to perform R UE coordinaiton tasks,   SENSATION: WFL  EDEMA: Slight RUE  MUSCLE TONE: RUE: Hypotonic and Flaccid  COGNITION: Overall cognitive status: Within functional limits for tasks assessed and h/o more severe aphasia and does self-report need to think about what he is trying to say etc  VISION: Subjective report: Eye Dr ~ 3 weeks Baseline vision: Wears glasses for reading only Visual history: cataracts  VISION ASSESSMENT: TBA  Patient is not having difficulty with activities due to vision.  PERCEPTION: Not tested  PRAXIS: Not tested  OBSERVATIONS: Pt ambulated into clinic w/RW and R hand orthosis. Patient was wearing his AFO which he stated he stopped wearing after he finished home health therapy.   TODAY'S TREATMENT:                                                                                                                               NMES OT educated patient on use of  estim/NMES to encourage improved nerve function as needed to produce more active RUE digital/thumb extension. Therapist educated patient on  application for RUE digital/thumb extension and applied electrodes accordingly. Therapist utilized NMES at same setting as previous session -- channel 1, Small, Custom: Time: >10 minutes due to adjusting electrode postioning at least 2x for most effective thumb ROM, On time: 10 s, Off time: 8 s, Ramp 2s, Rate 50 Hz, Sym waveform. Patient tolerating up to 32 intensity with supervised application to promote improved AROM and pain management. Therapist assisted pt to complete AROM release with RUE during NMES treatment.  Neuro Re-Ed Neuromuscular reeducation continuing to include specific visual, tactile and verbal cues and assistance to stimulate the neuromuscular system and promote functional movement in RUE during application of NMES to grasp and release blocks, gold balls etc.  Pt most effective with arm resting on elevated foam arm wedge/trough, for max comfort and success with dropping item into a container.  Pt engaged in "holding" item when estim was off ant hen using estim to help release the object with good success.   PATIENT EDUCATION: Education details: AAROM Person educated: Patient Education method: Explanation, Demonstration, Tactile cues, and Verbal cues Education comprehension: verbalized understanding, returned demonstration, verbal cues required, tactile cues required, and needs further education  HOME EXERCISE PROGRAM:  10/20/22 - UE ROM (grip, forearm, elbow flex, shoulder shrugs) - no images provided only written suggestions. 10/27/2022: updated ROM HEP Access Code [email or web address] 11/03/22 - sleep positioning handout provided  GOALS: Goals reviewed with patient? Yes  SHORT TERM GOALS: Target date: 11/13/22  Patient will demonstrate updated RUE HEP with 25% verbal cues or less for proper execution.  Baseline: New to outpatient OT.  Self reported difficulty with motivation at home Goal status: IN progress  2.  Patient will demonstrate at least 8+ lbs RUE grip strength as needed to  stabilize container on tabletop/hold large handled object.  Baseline: Right 4.3 lbs Goal status: IN progress  3.  Patient  will be able to don T-shirt without physical assistance x 5 days in a row. Baseline: Caregiver assistance to get R UE in sleeve Goal status: INITIAL  4.  Patient will improve R UE AROM for shoulder elevation and elbow flexion to lift arm/hand to walker.  Baseline: Caregiver assist or uses LUE.  Trace elbow/shoulder movement Goal status: IN Progress  5.  Patient will be assisted to don/doff appropriate splints/positioning devices to minimize subluxation and minimize risk of RUE/hand contracture. Baseline: None place Goal status: IN Progress   LONG TERM GOALS: Target date: 12/11/22  Patient will demonstrate updated RUE HEP with visual schedule and images for proper execution and frequency. Baseline: New to outpatient OT.  Self reported difficulty with motivation at home. Goal status: IN Progress  2.  Patient will demonstrate at least 10 lbs RUE grip strength as needed to hold objects for manipulation by LUE. Baseline: Right 4.3 lbs Goal status: IN Progress  3.  Patient will be able to get to EOB with no physical assistance to toilet with BSC as needed. Baseline: Assistance to get to EOB from caregivers. Goal status: IN Progress  4.  Patient will be able to prepare simple food items with appropriate AE (small crock pot, microwave, toaster oven etc) Baseline: Caregiver/family perform meal prep  Goal status: IN Progress 11/12/22 - showed pt walker tray for home use  5.  Patient will report at least two-point increase in average PSFS score or at least three-point increase in a single activity score indicating functionally significant improvement given minimum detectable change. Baseline: 1.0 total score (See above for individual activity scores)  Goal status: INITIAL   ASSESSMENT:  CLINICAL IMPRESSION: Patient is a 72 y.o. male who was seen today for  occupational therapy treatment for left MCA stroke with RUE weakness/hemiparesis. Pt responded well to NMES R digital/thumb extension and neuro re-ed for RUE A/AROM activities which functional use of R hand.  Pt will continue to benefit from trials of NMES for R UE to improve neuromuscular return and functional use of RUE.  He will benefit from continued skilled OT services in the outpatient setting to work on RUE impairments to help pt return to higher independence, safety and as modified independence as able.      PERFORMANCE DEFICITS: in functional skills including ADLs, coordination, dexterity, proprioception, sensation, tone, ROM, strength, flexibility, Fine motor control, Gross motor control, mobility, balance, endurance, decreased knowledge of use of DME, skin integrity, and UE functional use, cognitive skills including energy/drive, safety awareness, and sequencing, and psychosocial skills including coping strategies, environmental adaptation, and routines and behaviors.   IMPAIRMENTS: are limiting patient from ADLs, IADLs, rest and sleep, leisure, and social participation.   CO-MORBIDITIES: may have co-morbidities  that affects occupational performance. Patient will benefit from skilled OT to address above impairments and improve overall function.  REHAB POTENTIAL: Good   PLAN:  OT FREQUENCY: 1-2x/week  OT DURATION: 8 weeks  PLANNED INTERVENTIONS: self care/ADL training, therapeutic exercise, therapeutic activity, neuromuscular re-education, manual therapy, passive range of motion, balance training, functional mobility training, aquatic therapy, splinting, electrical stimulation, patient/family education, cognitive remediation/compensation, visual/perceptual remediation/compensation, energy conservation, coping strategies training, and DME and/or AE instructions  RECOMMENDED OTHER SERVICES: PT evaluation already completed.  CONSULTED AND AGREED WITH PLAN OF CARE: Patient and family  member/caregiver  PLAN FOR NEXT SESSION:   Continue E-stim protocols for R UE AAROM as tolerated Ensure good success with splinting needs/PRN modifications  UB dressing education for I carryover ADL dressing  trials and IADLS (cooking)  Supportive RUE treatment options  Victorino Sparrow, OT 11/12/2022, 2:29 PM

## 2022-11-13 ENCOUNTER — Telehealth: Payer: Self-pay | Admitting: Internal Medicine

## 2022-11-13 MED ORDER — IRBESARTAN 75 MG PO TABS: 75.0000 mg | ORAL_TABLET | Freq: Every day | ORAL | 1 refills | Status: DC

## 2022-11-13 MED ORDER — MAGNESIUM OXIDE 400 MG PO TABS: 400.0000 mg | ORAL_TABLET | Freq: Every day | ORAL | 3 refills | Status: DC

## 2022-11-13 MED ORDER — AMLODIPINE BESYLATE 10 MG PO TABS: 10.0000 mg | ORAL_TABLET | Freq: Every day | ORAL | 1 refills | Status: DC

## 2022-11-13 NOTE — Telephone Encounter (Signed)
Prescription Request  11/13/2022  Is this a "Controlled Substance" medicine? No  LOV: 10/12/2022  What is the name of the medication or equipment?  irbesartan (AVAPRO) 75 MG table magnesium oxide amLODipine (NORVASC) 10 MG tablet  Have you contacted your pharmacy to request a refill? No   Which pharmacy would you like this sent to?  [id number] [place], [place] - [address] [place] [address] Phone: [phone] Fax: [phone]    Patient notified that their request is being sent to the clinical staff for review and that they should receive a response within 2 business days.   Please advise at [facility] [phone]

## 2022-11-13 NOTE — Telephone Encounter (Signed)
Rxs sent

## 2022-11-13 NOTE — Addendum Note (Signed)
Addended byConrad Hermosa D on: 11/13/2022 08:53 AM   Modules accepted: Orders

## 2022-11-17 ENCOUNTER — Ambulatory Visit: Payer: Medicare HMO | Admitting: Occupational Therapy

## 2022-11-17 ENCOUNTER — Ambulatory Visit: Payer: Medicare HMO | Admitting: Physical Therapy

## 2022-11-17 VITALS — BP 142/91 | HR 88

## 2022-11-17 DIAGNOSIS — M6281 Muscle weakness (generalized): Secondary | ICD-10-CM

## 2022-11-17 DIAGNOSIS — R2681 Unsteadiness on feet: Secondary | ICD-10-CM | POA: Diagnosis not present

## 2022-11-17 DIAGNOSIS — R278 Other lack of coordination: Secondary | ICD-10-CM

## 2022-11-17 DIAGNOSIS — R2689 Other abnormalities of gait and mobility: Secondary | ICD-10-CM

## 2022-11-17 DIAGNOSIS — I69351 Hemiplegia and hemiparesis following cerebral infarction affecting right dominant side: Secondary | ICD-10-CM

## 2022-11-17 NOTE — Therapy (Signed)
OUTPATIENT PHYSICAL THERAPY NEURO TREATMENT   Patient Name: Jimmy Valentine MRN: [id number] DOB:04/23/1950, 72 y.o., male Today's Date: 11/17/2022   PCP: Wanda Plump, MD REFERRING PROVIDER: Wanda Plump, MD  END OF SESSION:  PT End of Session - 11/17/22 1100     Visit Number 9    Number of Visits 17   Plus eval   Date for PT Re-Evaluation 12/09/22    Authorization Type [employer]    PT Start Time 1100    PT Stop Time 1143    PT Time Calculation (min) 43 min    Equipment Utilized During Treatment Gait belt    Activity Tolerance Patient tolerated treatment well    Behavior During Therapy WFL for tasks assessed/performed                   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,had a MRI, was prescribed testosterone by endocrinology   Sleep apnea    does't use cpap   Sudden hearing loss, left 2017   idiopathic sensorineural hearing loss   Past Surgical History:  Procedure Laterality Date   TOTAL KNEE ARTHROPLASTY Right 2005   TOTAL KNEE ARTHROPLASTY  12/04/2011   LEFT-- TOTAL KNEE ARTHROPLASTY;  Surgeon: Eugenia Mcalpine, MD;  Location: [facility];  Service: Orthopedics;  Laterality: Left;   Patient Active Problem List   Diagnosis Date Noted   Hemiparesis affecting right side as late effect of cerebrovascular accident (CVA) (HCC) 09/04/2022   Gait disturbance, post-stroke 09/04/2022   Hyponatremia 07/18/2022   Aphasia due to acute cerebrovascular accident (CVA) (HCC) 07/15/2022   Acute ischemic left MCA stroke (HCC) 06/24/2022   Stroke (cerebrum) (HCC) 06/21/2022   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 10/18/2014   Elevated LFTs 04/13/2014   Diabetes (HCC) 09/03/2011   Gout 09/03/2011   OSA (obstructive sleep apnea) 04/22/2011   Annual physical exam 12/04/2010   Hypertension    Hyperlipidemia    Hypogonadism male    Erectile dysfunction     ONSET DATE: 09/23/2022 (referral)   REFERRING DIAG: O13.086 (ICD-10-CM) - Hemiparesis affecting right side as late effect of cerebrovascular accident (CVA) (HCC)  THERAPY DIAG:  Hemiplegia and hemiparesis following cerebral infarction affecting right dominant side (HCC)  Other abnormalities of gait and mobility  Muscle weakness (generalized)  Other lack of coordination  Unsteadiness on feet  Rationale for Evaluation and Treatment: Rehabilitation  SUBJECTIVE:  SUBJECTIVE STATEMENT:  HEP stretches have gone well. Reports no falls. Wants to walk without walker, is aware that he will need some sort of assistive device (cane, hemiwalker).  Pt accompanied by:  Caregiver, [name] (in lobby)   PERTINENT HISTORY: L MCA CVA in May 2024   PAIN:  Are you having pain?  "Not really," just feels "something in groin"  PRECAUTIONS: Fall  WEIGHT BEARING RESTRICTIONS: No  FALLS: Has patient fallen in last 6 months? Yes. Number of falls 1  LIVING ENVIRONMENT: Lives with: lives alone Lives in: House/apartment Stairs: Yes: External: 3 steps; on right going up, on left going up, and can reach both Has following equipment at home: Walker - 2 wheeled, Wheelchair (manual), shower chair, bed side commode, and AFO  PLOF: Independent  PATIENT GOALS: "Get back to where I can walk by myself without the walker and without assistance"   OBJECTIVE:   DIAGNOSTIC FINDINGS: MRI of brain on 06/22/22  IMPRESSION: 1. Positive for Left MCA territory  lacunar type infarct tracking from the left corona radiata to the lentiform. Petechial hemorrhage (Heidelberg classification 1b: HI2). No malignant hemorrhagic transformation. No significant mass effect.   2. No other acute intracranial abnormality. 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  CT angio of head/neck on 06/21/22 IMPRESSION: 1. 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 The left MCA bi/trifurcation and left M2 branches remain patent.   2. Superimposed Severe intracranial and cervical ICA Atherosclerosis resulting in: - distal Right Vertebral Artery (V4) OCCLUSION. - distal Left Vertebral Artery Severe (V4) stenosis. - Right ICA origin RADIOGRAPHIC STRING SIGN stenosis due to calcified plaque. - Left ICA origin stenosis due to complex plaque estimated 65-70%. - Severe Left ICA supraclinoid stenosis due to calcified plaque.  COGNITION: Overall cognitive status: Within functional limits for tasks assessed   SENSATION: Pt denies numbness/tingling in RLE/RUE   COORDINATION: Heel to shin test: WNL on LLE, unable to perform hip flexion on RLE but able to achieve cross-legged position    MUSCLE TONE: Mild clonus in R foot that fatigues after 3s   POSTURE: rounded shoulders and forward head   LOWER EXTREMITY MMT:  Tested in seated position   MMT Right Eval Left Eval  Hip flexion 2- 4+  Hip extension    Hip abduction 1 4+  Hip adduction 2 4+  Hip internal rotation    Hip external rotation    Knee flexion 3 4+  Knee extension 2 4+  Ankle dorsiflexion 4- 4+  Ankle plantarflexion    Ankle inversion    Ankle eversion    (Blank rows = not tested)  BED MOBILITY:  Has a rail but requires assistance (up to max) to roll and perform sit  <>supine   TRANSFERS: Assistive device utilized: Environmental consultant - 2 wheeled  Sit to stand: SBA Stand to sit: SBA Pt kickstands RLE and leans to L side to perform    GAIT: Gait pattern: step to pattern, decreased step length- Right, decreased stride length, decreased hip/knee flexion- Right, decreased ankle dorsiflexion- Right, Left hip hike, lateral lean- Left, and poor foot clearance- Right Distance walked: Various clinic distances  Assistive device utilized: Walker - 2 wheeled Level of assistance: CGA and Min A Comments: Pt not wearing AFO this date. Noted pt dragging RLE on ground,  especially w/fatigue, and would lose balance anteriorly requiring min A to stabilize. Pt also vaults to progress RLE and frequently tips RW over on L side.      TODAY'S TREATMENT:       TherAct- vitals WNL for therapy Vitals:   11/17/22 1106  BP: (!) 142/91  Pulse: 88   NMR Bed mobility Supine scooting to the right with verbal cues for sequencing Supine to prone roll to the left with Min A for RUE management and R hip rotation  In prone AROM hamstring curl x6 reps on LLE AAROM hamstring curl x6 reps on RLE, palpated hamstring contraction x1-2 seconds AROM hip abduction x10 reps bilaterally  Press up to bilateral elbows for weightbearing 2 repetitions x1 minute holds Pt initially more comfortable with pillow under R elbow, progressed to R elbow on mat Pt demonstrates weight shifted to L  Bed mobility Prone to supine roll to the right with Mod A for RUE management and R hip/LE placement Supine to short sitting with Mod A left hand hold assist Note this was attempted from a poor initial position and pt can likely do with less assistance from a better start point  Gait At ballet bar Ambulating w/ LUE support on ballet bar x10 feet x3 repetitions w/ CGA progressing to Min A Ambulating w/ LUE hand-hold support x10 feet x2 repetitions w/ Min A progressing to Mod A Pt with increased difficulty completing  180* turns, slow  Small base quad cane x35 ft  Pt w/ spontaneous step-to 3-point pattern Gait pattern: step to pattern, decreased step length- Right, decreased hip/knee flexion- Right, decreased ankle dorsiflexion- Right, Left hip hike, lateral lean- Left, and poor foot clearance- Right Distance walked: 35 ft Assistive device utilized: Quad cane small base Level of assistance:  +2 CGA>Min A for balance, Total A for RUE management Comments: Pt notes "I feel very unstable", increased time and effort                                                                                                              PATIENT EDUCATION:  Education details: Potential to try hemiwalker for balance challenges but SBQC is not safe at this moment, try prone at home with aid Person educated: Patient Education method: Explanation and Demonstration Education comprehension: verbalized understanding and returned demonstration  HOME EXERCISE PROGRAM:  Access Code: [email or web address] URL: [email or web address] Date: 11/12/2022 Prepared by: Alethia Berthold Plaster  Exercises - Supine Figure 4 Piriformis Stretch  - 1 x daily - 7 x weekly - 3 sets - 1-3 minute hold - Seated Piriformis Stretch  - 1 x daily - 7 x weekly - 3 sets - 1-3 minute hold  GOALS: Goals reviewed with patient? Yes  SHORT TERM GOALS: Target date: 10/28/2022   Pt will perform initial HEP w/min A from caregiver for improved strength, balance, transfers and gait.  Baseline: to be established  Goal status: IN PROGRESS  2.  Pt will improve gait velocity to at least 0.7 ft/s w/RW and CGA for improved gait efficiency  and independence   Baseline: 0.49 ft/s w/RW and min A; 0.66 ft/s w/RW and SBA Goal status: PARTIALLY MET   3.  Pt will be able to don and doff R hand orthotic on RW w/CGA for improved independence and weight shift to R side  Baseline: max A; min A; SBA (10/3)  Goal status: MET   4.  Pt will ambulate greater than or equal to 300  feet on with LRAD and CGA for improved cardiovascular endurance and BLE strength.  Baseline: 213 ft with RW and min A (9/17); 195' w/RW and SBA  Goal status: PARTIALLY MET   5.  Berg to be assessed and LTG written  Baseline: 25/56 Goal status: MET  6.  Pt will perform supine > sit EOB w/min A or use of rail for improved independence and functional strength Baseline: mod-max A; min A (9/26) Goal status: MET  LONG TERM GOALS: Target date: 11/25/2022   Pt will be independent with final HEP for improved strength, balance, transfers and gait.  Baseline:  Goal status: INITIAL  2.  Pt will improve gait velocity to at least 1.0 ft/s w/LRAD and SBA for improved gait efficiency and independence   Baseline:  Goal status: INITIAL  3.  Pt will ambulate greater than or equal to 400 feet on with LRAD and CGA for improved cardiovascular endurance and BLE strength.   Baseline: 213 ft with RW and min A (9/17)  Goal status: INITIAL  4.  Pt will improve Berg score to 34/56 for decreased fall risk  Baseline: 25/56 Goal status: REVISED  5.  Pt will perform bed mobility at CGA or SBA level for improved independence at home  Baseline: mod-max A Goal status: INITIAL   ASSESSMENT:  CLINICAL IMPRESSION:  Emphasis of skilled PT session on functional mobility, hip strength and mobility, RUE weightbearing, and assistive device practice. Pt tolerated prone positioning well, requiring min to mod A to get in and out of the position. Practiced ambulating w/ single UE support with ballet bars, hand hold assist, and SBQC and discussed imbalance and lack of safety with these options for home compared to RW at this time, but can continue to practice in clinic. Continue POC.  OBJECTIVE IMPAIRMENTS: Abnormal gait, decreased activity tolerance, decreased balance, decreased coordination, decreased mobility, difficulty walking, decreased strength, impaired tone, impaired UE functional use, improper body  mechanics, and pain.   ACTIVITY LIMITATIONS: carrying, lifting, standing, squatting, stairs, transfers, bed mobility, bathing, toileting, dressing, self feeding, reach over head, hygiene/grooming, locomotion level, and caring for others  PARTICIPATION LIMITATIONS: meal prep, cleaning, laundry, medication management, personal finances, interpersonal relationship, driving, shopping, community activity, and yard work  PERSONAL FACTORS: Fitness, Past/current experiences, Transportation, and 1 comorbidity: CVA  are also affecting patient's functional outcome.   REHAB POTENTIAL: Good  CLINICAL DECISION MAKING: Evolving/moderate complexity  EVALUATION COMPLEXITY: Moderate  PLAN:  PT FREQUENCY: 2x/week  PT DURATION: 8 weeks (POC written for 10 weeks due to delay in scheduling)   PLANNED INTERVENTIONS: Therapeutic exercises, Therapeutic activity, Neuromuscular re-education, Balance training, Gait training, Patient/Family education, Self Care, Joint mobilization, Stair training, Orthotic/Fit training, DME instructions, Aquatic Therapy, Dry Needling, Electrical stimulation, Manual therapy, and Re-evaluation  PLAN FOR NEXT SESSION:  Will need +2 if doing tall kneel activities. Prone- decrease assist amount? continue with Bioness to R HS and ant tib; RLE NMR, weight shift to R side, work on bed mobility and donning/doffing RUE from hand orthotic and donning/doffing AFO more independently; can trial  hemiwalker ambulation- sling for RUE?, stepping activities at parallel/ballet bars   Beverely Low, [facility] [address] [place], [place]  [address] Phone:  [phone] Fax:  [phone]  11/17/2022, 12:46 PM

## 2022-11-17 NOTE — Therapy (Unsigned)
OUTPATIENT OCCUPATIONAL THERAPY NEURO TREATMENT  Patient Name: Jimmy Valentine MRN: [id number] DOB:07/11/1950, 72 y.o., male Today's Date: 11/17/2022  PCP: Wanda Plump, MD REFERRING PROVIDER: Wanda Plump, MD  END OF SESSION:  OT End of Session - 11/17/22 1019     Visit Number 9    Number of Visits 16   + evaluaiton   Date for OT Re-Evaluation 12/11/22    Authorization Type [employer] MC 2024 Met VL: [place] Auth Not Reqd    OT Start Time 1017    OT Stop Time 1100    OT Time Calculation (min) 43 min    Equipment Utilized During Treatment blocks, arm trough    Activity Tolerance Patient tolerated treatment well    Behavior During Therapy WFL for tasks assessed/performed              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,had a MRI, was prescribed testosterone by endocrinology   Sleep apnea    does't use cpap   Sudden hearing loss, left 2017   idiopathic sensorineural hearing loss   Past Surgical History:  Procedure Laterality Date   TOTAL KNEE ARTHROPLASTY Right 2005   TOTAL KNEE ARTHROPLASTY  12/04/2011   LEFT-- TOTAL KNEE ARTHROPLASTY;  Surgeon: Eugenia Mcalpine, MD;  Location: [facility];  Service: Orthopedics;  Laterality: Left;   Patient Active Problem List   Diagnosis Date Noted   Hemiparesis affecting right side as late effect of cerebrovascular accident (CVA) (HCC) 09/04/2022   Gait disturbance, post-stroke 09/04/2022   Hyponatremia 07/18/2022   Aphasia due to acute cerebrovascular accident (CVA) (HCC) 07/15/2022   Acute ischemic left MCA stroke (HCC) 06/24/2022   Stroke (cerebrum) (HCC) 06/21/2022   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 10/18/2014   Elevated LFTs 04/13/2014   Diabetes (HCC) 09/03/2011   Gout 09/03/2011   OSA (obstructive sleep apnea) 04/22/2011   Annual physical exam 12/04/2010   Hypertension    Hyperlipidemia    Hypogonadism male    Erectile dysfunction     ONSET DATE: Referral date: 10/01/2022  Stroke 06/21/22  REFERRING DIAG: I69.351 (ICD-10-CM) - Hemiplegia and hemiparesis following cerebral infarction affecting right dominant side (HCC)  THERAPY DIAG:   Muscle weakness (generalized)  Hemiplegia and hemiparesis following cerebral infarction affecting right dominant side (HCC)  Other lack of coordination  Rationale for Evaluation and Treatment: Rehabilitation  SUBJECTIVE:   SUBJECTIVE STATEMENT:  Pt reported that he had some soreness in R groin/hip this morning but that it was going away.  Also reports being able to get up on edge of bed himself.  He continues to get some help with dressing from caregivers but they are letting him do as much as he can for himself.    Pt accompanied by: self  PERTINENT HISTORY:  Presented to [facility] ED on 06/21/2022 with sudden onset of right-sided weakness, dysarthria and facial drooping. MRI showed large MCA territory basal ganglia infarct with petechial hemorrhage with no hemorrhagic transformation or mass effect.   Inpatient Rehab: 06/24/2022 - 07/30/2022 Patient discharged at [facility] Assist level.  Patient's  care partners able to provide the necessary physical assistance at discharge.   Recommendation:  Patient will benefit from ongoing skilled OT services in home health setting to continue to advance functional skills in the area of BADL, iADL, and Reduce care partner burden. Equipment: Shower chair, commode, RW, R RW hand splint, R resting hand splint, LH sponge, Dycem   Home Health therapies 07/24 - 08/24     PRECAUTIONS: Fall  WEIGHT BEARING RESTRICTIONS: No  PAIN:  Are you having pain? No Some in L  arm/shoulder is not as tender from pulling up in the bed  FALLS: Has patient fallen in last 6 months? Yes. Number of falls 1x - fell on his bottom about a month ago - got the walker stuck on the doorframe  LIVING ENVIRONMENT: Lives with: lives alone and Caregiver 3-4 days/week x 12 hours, daughter, son and brother-in-law assist on other days Lives in: House/apartment Stairs: Yes: External: 3-5 steps; can reach both Has following equipment at home: Environmental consultant - 2 wheeled, Wheelchair (manual), Shower bench, bed side commode, Grab bars, and urinal  PLOF: Independent - still driving, played golf, retired Immunologist with lots traveling (driving)  PATIENT GOALS: To get my right side back working again  OBJECTIVE:   HAND DOMINANCE: Right (affected side)  ADLs: Overall ADLs: Min to mod assistance Transfers/ambulation related to ADLs: Supervision Eating: Gets help to cut food as needed, having to use L hand Grooming: Used regular razor yesterday for the first time UB Dressing: Needs help to get R arm in sleeve LB Dressing: Can pull pants up most of the time Toileting: Most of the time he is able to perform on his own; uses urinal at bedside at night Bathing: "50/50" with caregiver Tub Shower transfers: Gets help to get his leg into the tub Equipment: Transfer tub bench, Grab bars, bed side commode, and Long handled sponge  IADLs: Shopping: caregiver and family (daughter) Light housekeeping: Caregiver Meal Prep: Psychologist, counselling mobility: RW with hand orthosis & dycem + strap for R hand Medication management: Assistance from caregivers Financial management: Assistance from family Handwriting: unable with R UE; not assessed with L UE  MOBILITY STATUS: Needs Assist: Help to get his R UE/hand on the handle of his walker and Hx of falls  POSTURE COMMENTS:  rounded shoulders and forward head Sitting balance:  WFL in arm chair at table  ACTIVITY TOLERANCE: Activity tolerance:  Limited due to R hemiplegia  FUNCTIONAL OUTCOME MEASURES: PSFS - 1.0  UPPER EXTREMITY ROM:    Active ROM Right eval Left eval  Shoulder flexion  [facility]  Shoulder abduction    Shoulder adduction    Shoulder extension min   Shoulder internal rotation    Shoulder external rotation    Elbow flexion min   Elbow extension    Wrist flexion min   Wrist extension    Wrist ulnar deviation    Wrist radial deviation    Wrist pronation    Wrist supination    (Blank rows = not tested)  UPPER EXTREMITY MMT:     MMT Right eval Left eval  Shoulder flexion  4/5  Shoulder abduction    Shoulder adduction    Shoulder extension    Shoulder internal rotation    Shoulder external rotation    Middle trapezius    Lower trapezius    Elbow flexion    Elbow extension    Wrist flexion    Wrist extension    Wrist ulnar deviation  Wrist radial deviation    Wrist pronation    Wrist supination    (Blank rows = not tested)  HAND FUNCTION: Grip strength: Right: 3.9, 3.7, 5.2 lbs; Left: 34.8, 36.5, 37.9 lbs Average: Right 4.3 lbs Left 36.4 lbs  COORDINATION: Unable to perform R UE coordinaiton tasks,   SENSATION: WFL  EDEMA: Slight RUE  MUSCLE TONE: RUE: Hypotonic and Flaccid  COGNITION: Overall cognitive status: Within functional limits for tasks assessed and h/o more severe aphasia and does self-report need to think about what he is trying to say etc  VISION: Subjective report: Eye Dr ~ 3 weeks Baseline vision: Wears glasses for reading only Visual history: cataracts  VISION ASSESSMENT: TBA  Patient is not having difficulty with activities due to vision.  PERCEPTION: Not tested  PRAXIS: Not tested  OBSERVATIONS: Pt ambulated into clinic w/RW and R hand orthosis. Patient was wearing his AFO which he stated he stopped wearing after he finished home health therapy.   TODAY'S TREATMENT:                                                                                                                                 Neuro Re-Ed Neuromuscular reeducation continuing to include specific visual, tactile and verbal cues and assistance to stimulate the neuromuscular system and promote functional movement in RUE grasp and release activities Tasks conducted with RUE atop elevated arm trough with wrist over the edge to allow him some use of gravity to help with opening hand with fair success without use of NMES today to hold different sized, textured and weights of objects including: Blocks x 2 different sizes 1-1.5 " and Golf balls  Pt encouraged to hold objects for 1-10 second increments to work on purposeful release rather than accidentally dropping or losing grip.  He then worked on opening fingers to drop objects in container with arm supported on elevated wedge.  Pt encouraged to use some slight wrist flexion and pushing arm forward to  also help with release of objects as well as muscle tap in forearm to awaken extensor muscles.  He is engaged in trying to isolate digital and thumb motions as much as possible.  He needs object placed in his pincer/tripod grasp for max success.  Varied the position of wrist throughout task to isolate optimal position for pt to release blocks with most succes with 1" versus larger blocks/items.  Also reviewed shoulder shrugs and shoulder retraction for improving shoulder blade stability with use of mirror to improve symmetrical motions, max elevation and good posture during activity.   PATIENT EDUCATION: Education details: AAROM Person educated: Patient Education method: Explanation, Demonstration, Tactile cues, and Verbal cues Education comprehension: verbalized understanding, returned demonstration, verbal cues required, tactile cues required, and needs further education  HOME EXERCISE PROGRAM:  10/20/22 - UE ROM (grip, forearm, elbow flex, shoulder shrugs) - no images provided only written suggestions. 10/27/2022: updated ROM HEP Access Code  [email or web address]  11/03/22 - sleep positioning handout provided  GOALS: Goals reviewed with patient? Yes  SHORT TERM GOALS: Target date: 11/13/22  Patient will demonstrate updated RUE HEP with 25% verbal cues or less for proper execution.  Baseline: New to outpatient OT.  Self reported difficulty with motivation at home Goal status: IN progress  2.  Patient will demonstrate at least 8+ lbs RUE grip strength as needed to stabilize container on tabletop/hold large handled object.  Baseline: Right 4.3 lbs Goal status: IN progress  3.  Patient will be able to don T-shirt without physical assistance x 5 days in a row. Baseline: Caregiver assistance to get R UE in sleeve Goal status: IN Progress  4.  Patient will improve R UE AROM for shoulder elevation and elbow flexion to lift arm/hand to walker.  Baseline: Caregiver assist or uses LUE.  Trace elbow/shoulder movement Goal status: IN Progress  5.  Patient will be assisted to don/doff appropriate splints/positioning devices to minimize subluxation and minimize risk of RUE/hand contracture. Baseline: None place Goal status: IN Progress   LONG TERM GOALS: Target date: 12/11/22  Patient will demonstrate updated RUE HEP with visual schedule and images for proper execution and frequency. Baseline: New to outpatient OT.  Self reported difficulty with motivation at home. Goal status: IN Progress  2.  Patient will demonstrate at least 10 lbs RUE grip strength as needed to hold objects for manipulation by LUE. Baseline: Right 4.3 lbs Goal status: IN Progress  3.  Patient will be able to get to EOB with no physical assistance to toilet with BSC as needed. Baseline: Assistance to get to EOB from caregivers. Goal status: IN Progress  4.  Patient will be able to prepare simple food items with appropriate AE (small crock pot, microwave, toaster oven etc) Baseline: Caregiver/family perform meal prep  Goal status: IN Progress 11/12/22 - showed pt walker  tray for home use  5.  Patient will report at least two-point increase in average PSFS score or at least three-point increase in a single activity score indicating functionally significant improvement given minimum detectable change. Baseline: 1.0 total score (See above for individual activity scores)  Goal status: INITIAL   ASSESSMENT:  CLINICAL IMPRESSION: Patient is a 72 y.o. male who was seen today for occupational therapy treatment for left MCA stroke with RUE weakness/hemiparesis. Pt responded well to NRE for RUE A/AROM activities which functional use of R hand in elevated position on arm support.  Pt will continue to benefit from trials of NMES along with NRE for R UE to improve neuromuscular return and functional use of RUE.  He will benefit from continued skilled OT services in the outpatient setting to work on RUE impairments to help pt return to higher independence, safety and as modified independence as able.      PERFORMANCE DEFICITS: in functional skills including ADLs, coordination, dexterity, proprioception, sensation, tone, ROM, strength, flexibility, Fine motor control, Gross motor control, mobility, balance, endurance, decreased knowledge of use of DME, skin integrity, and UE functional use, cognitive skills including energy/drive, safety awareness, and sequencing, and psychosocial skills including coping strategies, environmental adaptation, and routines and behaviors.   IMPAIRMENTS: are limiting patient from ADLs, IADLs, rest and sleep, leisure, and social participation.   CO-MORBIDITIES: may have co-morbidities  that affects occupational performance. Patient will benefit from skilled OT to address above impairments and improve overall function.  REHAB POTENTIAL: Good   PLAN:  OT FREQUENCY: 1-2x/week  OT DURATION: 8 weeks  PLANNED INTERVENTIONS:  self care/ADL training, therapeutic exercise, therapeutic activity, neuromuscular re-education, manual therapy, passive range  of motion, balance training, functional mobility training, aquatic therapy, splinting, electrical stimulation, patient/family education, cognitive remediation/compensation, visual/perceptual remediation/compensation, energy conservation, coping strategies training, and DME and/or AE instructions  RECOMMENDED OTHER SERVICES: PT evaluation already completed.  CONSULTED AND AGREED WITH PLAN OF CARE: Patient and family member/caregiver  PLAN FOR NEXT SESSION:   Continue E-stim protocols and NRE for R UE AAROM  Supportive RUE treatment options Shoulder stability  ADL dressing trials and IADLS (cooking) PRN splinting checks   Victorino Sparrow, OT 11/17/2022, 12:30 PM

## 2022-11-19 ENCOUNTER — Ambulatory Visit: Payer: Medicare HMO | Admitting: Occupational Therapy

## 2022-11-19 ENCOUNTER — Ambulatory Visit: Payer: Medicare HMO | Admitting: Physical Therapy

## 2022-11-19 VITALS — BP 158/72 | HR 94

## 2022-11-19 DIAGNOSIS — I69351 Hemiplegia and hemiparesis following cerebral infarction affecting right dominant side: Secondary | ICD-10-CM

## 2022-11-19 DIAGNOSIS — R2689 Other abnormalities of gait and mobility: Secondary | ICD-10-CM

## 2022-11-19 DIAGNOSIS — R2681 Unsteadiness on feet: Secondary | ICD-10-CM | POA: Diagnosis not present

## 2022-11-19 DIAGNOSIS — R278 Other lack of coordination: Secondary | ICD-10-CM

## 2022-11-19 DIAGNOSIS — M6281 Muscle weakness (generalized): Secondary | ICD-10-CM

## 2022-11-19 NOTE — Therapy (Addendum)
OUTPATIENT OCCUPATIONAL THERAPY NEURO TREATMENT / Progress Note  Patient Name: Jimmy Valentine MRN: [id number] DOB:12-10-1950, 72 y.o., male Today's Date: 11/19/2022  PCP: Wanda Plump, MD REFERRING PROVIDER: Wanda Plump, MD  END OF SESSION:  OT End of Session - 11/19/22 1148     Visit Number 10    Number of Visits 16   + evaluaiton   Date for OT Re-Evaluation 12/11/22    Authorization Type [employer] MC 2024 Met VL: [place] Auth Not Reqd    OT Start Time 1102    OT Stop Time 1143    OT Time Calculation (min) 41 min    Activity Tolerance Patient tolerated treatment well    Behavior During Therapy WFL for tasks assessed/performed               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,had a MRI, was prescribed testosterone by endocrinology   Sleep apnea    does't use cpap   Sudden hearing loss, left 2017   idiopathic sensorineural hearing loss   Past Surgical History:  Procedure Laterality Date   TOTAL KNEE ARTHROPLASTY Right 2005   TOTAL KNEE ARTHROPLASTY  12/04/2011   LEFT-- TOTAL KNEE ARTHROPLASTY;  Surgeon: Eugenia Mcalpine, MD;  Location: [facility];  Service: Orthopedics;  Laterality: Left;   Patient Active Problem List   Diagnosis Date Noted   Hemiparesis affecting right side as late effect of cerebrovascular accident (CVA) (HCC) 09/04/2022   Gait disturbance, post-stroke 09/04/2022   Hyponatremia 07/18/2022   Aphasia due to acute cerebrovascular accident (CVA) (HCC) 07/15/2022   Acute ischemic left MCA stroke (HCC) 06/24/2022   Stroke (cerebrum) (HCC) 06/21/2022   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  10/18/2014   Elevated LFTs 04/13/2014   Diabetes (HCC) 09/03/2011   Gout 09/03/2011   OSA (obstructive sleep apnea) 04/22/2011   Annual physical exam 12/04/2010   Hypertension    Hyperlipidemia    Hypogonadism male    Erectile dysfunction     ONSET DATE: Referral date: 10/01/2022  Stroke 06/21/22  REFERRING DIAG: I69.351 (ICD-10-CM) - Hemiplegia and hemiparesis following cerebral infarction affecting right dominant side (HCC)  THERAPY DIAG:   Other lack of coordination  Muscle weakness (generalized)  Hemiplegia and hemiparesis following cerebral infarction affecting right dominant side (HCC)  Rationale for Evaluation and Treatment: Rehabilitation  SUBJECTIVE:   SUBJECTIVE STATEMENT:  Pt reports no recent falls and no changes in medication. Pt reports things are "going okay" and "knows I'm getting better but it's a very slow process."  Pt accompanied by: self  PERTINENT HISTORY:  Presented to [facility] ED on 06/21/2022 with sudden onset of right-sided weakness, dysarthria and facial drooping. MRI showed large MCA territory basal ganglia infarct with petechial hemorrhage with no hemorrhagic transformation or mass effect.   Inpatient Rehab: 06/24/2022 - 07/30/2022 Patient discharged at [facility] Assist level.  Patient's care partners able to provide the necessary physical assistance at discharge.   Recommendation:  Patient will benefit from ongoing skilled OT services in home health setting to continue to advance functional skills in the area  of BADL, iADL, and Reduce care partner burden. Equipment: Shower chair, commode, RW, R RW hand splint, R resting hand splint, LH sponge, Dycem   Home Health therapies 07/24 - 08/24     PRECAUTIONS: Fall  WEIGHT BEARING RESTRICTIONS: No  PAIN:  Are you having pain? None at rest Pt reports minimal L arm/shoulder when completing tasks at home  FALLS: Has patient fallen in last 6 months? Yes. Number of falls 1x - fell on his bottom about a  month ago - got the walker stuck on the doorframe  LIVING ENVIRONMENT: Lives with: lives alone and Caregiver 3-4 days/week x 12 hours, daughter, son and brother-in-law assist on other days Lives in: House/apartment Stairs: Yes: External: 3-5 steps; can reach both Has following equipment at home: Environmental consultant - 2 wheeled, Wheelchair (manual), Shower bench, bed side commode, Grab bars, and urinal  PLOF: Independent - still driving, played golf, retired Immunologist with lots traveling (driving)  PATIENT GOALS: To get my right side back working again  OBJECTIVE:   HAND DOMINANCE: Right (affected side)  ADLs: Overall ADLs: Min to mod assistance Transfers/ambulation related to ADLs: Supervision Eating: Gets help to cut food as needed, having to use L hand Grooming: Used regular razor yesterday for the first time UB Dressing: Needs help to get R arm in sleeve LB Dressing: Can pull pants up most of the time Toileting: Most of the time he is able to perform on his own; uses urinal at bedside at night Bathing: "50/50" with caregiver Tub Shower transfers: Gets help to get his leg into the tub Equipment: Transfer tub bench, Grab bars, bed side commode, and Long handled sponge  IADLs: Shopping: caregiver and family (daughter) Light housekeeping: Caregiver Meal Prep: Psychologist, counselling mobility: RW with hand orthosis & dycem + strap for R hand Medication management: Assistance from caregivers Financial management: Assistance from family Handwriting: unable with R UE; not assessed with L UE  MOBILITY STATUS: Needs Assist: Help to get his R UE/hand on the handle of his walker and Hx of falls  POSTURE COMMENTS:  rounded shoulders and forward head Sitting balance:  WFL in arm chair at table  ACTIVITY TOLERANCE: Activity tolerance: Limited due to R hemiplegia  FUNCTIONAL OUTCOME MEASURES: PSFS - 1.0  UPPER EXTREMITY ROM:    Active ROM Right eval Left eval  Shoulder flexion  [employer]   Shoulder abduction    Shoulder adduction    Shoulder extension min   Shoulder internal rotation    Shoulder external rotation    Elbow flexion min   Elbow extension    Wrist flexion min   Wrist extension    Wrist ulnar deviation    Wrist radial deviation    Wrist pronation    Wrist supination    (Blank rows = not tested)  UPPER EXTREMITY MMT:     MMT Right eval Left eval  Shoulder flexion  4/5  Shoulder abduction    Shoulder adduction    Shoulder extension    Shoulder internal rotation    Shoulder external rotation    Middle trapezius    Lower trapezius    Elbow flexion    Elbow extension    Wrist flexion    Wrist extension    Wrist ulnar deviation    Wrist radial deviation    Wrist pronation    Wrist supination    (Blank rows = not tested)  HAND FUNCTION: Grip strength: Right: 3.9, 3.7, 5.2 lbs; Left: 34.8, 36.5, 37.9 lbs  Average: Right 4.3 lbs Left 36.4 lbs  COORDINATION: Unable to perform R UE coordinaiton tasks,   SENSATION: WFL  EDEMA: Slight RUE  MUSCLE TONE: RUE: Hypotonic and Flaccid  COGNITION: Overall cognitive status: Within functional limits for tasks assessed and h/o more severe aphasia and does self-report need to think about what he is trying to say etc  VISION: Subjective report: Eye Dr ~ 3 weeks Baseline vision: Wears glasses for reading only Visual history: cataracts  VISION ASSESSMENT: TBA  Patient is not having difficulty with activities due to vision.  PERCEPTION: Not tested  PRAXIS: Not tested  OBSERVATIONS: Pt ambulated into clinic w/RW and R hand orthosis. Patient was wearing his AFO which he stated he stopped wearing after he finished home health therapy.   TODAY'S TREATMENT:                                                                                                                               Therapeutic Activities Reviewed pt's progress towards goals. See goals below for details.  OT recommended pt bring  splint/brace next visit to evaluate fit d/t pt reporting discomfort at night.  OT educated pt about positioning of RUE during sleep and throughout the day to prevent shoulder subluxation and protect joints, purpose of wrist ext/flex exercises and stretches, upright seated/standing posture, core stability. Pt verbalized understanding.  Neuro Re-Ed  OT educated patient on use of NMES unit. Therapist educated patient on application for R wrist and digit flex. Patient tolerated supervised application of NMES, ramp up to 25, on R wrist flexors to promote improved AROM wrist flex and grasp strength. Pt demo'd key pinch and composite fist grasping of larger objects then progressed to smaller objects as task progressed with OT assistance for positioning of items PRN.  Modalities Skin Integrity Assessment:  NMES utilized: No redness, irritation or skin integrity concerns were noted before, during and/or after use. Patient was informed of risks and benefits to treatment today. Skin integrity prior to treatment: Intact. Skin integrity after treatment: Intact.   RUE wrist and digit ext on tabletop and with LUE assistance PRN - to improve ROM, provide counter-stretch after NMES wrist/digit flex task, decrease pain, prevent contractures. - OT provided support at pt's elbow. OT educated pt about attempting motions with RUE first before LUE assistance, and pt verbalized and demo'd understanding.  PATIENT EDUCATION: Education details: See Today's treatment above Person educated: Patient Education method: Explanation, Demonstration, Tactile cues, and Verbal cues Education comprehension: verbalized understanding, returned demonstration, verbal cues required, tactile cues required, and needs further education  HOME EXERCISE PROGRAM:  10/20/22 - UE ROM (grip, forearm, elbow flex, shoulder shrugs) - no images provided only written suggestions. 10/27/2022: updated ROM HEP Access Code [email or web address] 11/03/22 - sleep  positioning handout provided  GOALS: Goals reviewed with patient? Yes  SHORT TERM GOALS: Target date: 11/13/22  Patient will demonstrate updated RUE HEP with 25% verbal cues or less for proper execution.  Baseline: New to outpatient OT.  Self reported difficulty with motivation at home Goal status: IN progress 11/19/22 - Pt reported completing HEP. Pt demo'd understanding of exercises with approx. 40% v/c.  2.  Patient will demonstrate at least 8+ lbs RUE grip strength as needed to stabilize container on tabletop/hold large handled object.  Baseline: Right 4.3 lbs Goal status: IN progress 11/19/22 - Right: 5 lbs, with support from OT and pt's LUE for initial grasp positioning of RUE  3.  Patient will be able to don T-shirt without physical assistance x 5 days in a row. Baseline: Caregiver assistance to get R UE in sleeve Goal status: IN Progress 11/19/22 - Pt dons t-shirt on LUE and head, then caregiver provides assistance for RUE in sleeve.  4.  Patient will improve R UE AROM for shoulder elevation and elbow flexion to lift arm/hand to walker.  Baseline: Caregiver assist or uses LUE.  Trace elbow/shoulder movement Goal status: IN Progress 11/19/22 - Pt reports "sometimes" placing RUE on walker without LUE assistance though positioning requires full body compensatory movements. Pt primarily continues to use LUE.  5.  Patient will be assisted to don/doff appropriate splints/positioning devices to minimize subluxation and minimize risk of RUE/hand contracture. Baseline: None place Goal status: IN Progress 11/19/22 - Pt reports wearing splint at least 2-3 hours per day, not currently wearing at night d/t discomfort.    LONG TERM GOALS: Target date: 12/11/22  Patient will demonstrate updated RUE HEP with visual schedule and images for proper execution and frequency. Baseline: New to outpatient OT.  Self reported difficulty with motivation at home. Goal status: IN Progress  2.   Patient will demonstrate at least 10 lbs RUE grip strength as needed to hold objects for manipulation by LUE. Baseline: Right 4.3 lbs Goal status: IN Progress  3.  Patient will be able to get to EOB with no physical assistance to toilet with BSC as needed. Baseline: Assistance to get to EOB from caregivers. Goal status: IN Progress 11/19/22 - Pt continues to use urinal in bed. Pt reports improved ability to ind transition to EOB and standing.   4.  Patient will be able to prepare simple food items with appropriate AE (small crock pot, microwave, toaster oven etc) Baseline: Caregiver/family perform meal prep  Goal status: IN Progress 11/12/22 - showed pt walker tray for home use  5.  Patient will report at least two-point increase in average PSFS score or at least three-point increase in a single activity score indicating functionally significant improvement given minimum detectable change. Baseline: 1.0 total score (See above for individual activity scores)  Goal status: IN Progress   ASSESSMENT:  CLINICAL IMPRESSION: This 10th progress note is for dates: 10/15/22 to 11/19/2022. Pt has met 0 STGs and 0 LTGs though making steady progress towards goals. OT noted pt demo'd slightly improved R grip strength, pt reported increased ind with EOB and standing transfers, and pt demo'd increased ind with HEP. Pt making steady progress towards goals and continues to benefit from skilled OT services in the outpatient setting to work towards remaining goals or until max rehab potential is met.    Pt tolerated OT session tasks well today, including NMES. Continue pt education regarding use of RUE in daily tasks and adaptive strategies and A/E PRN. Pt will benefit from continued skilled OT services in the outpatient setting to work on RUE impairments to help pt return to higher independence, safety and as modified independence as able.  PERFORMANCE DEFICITS: in functional skills including ADLs,  coordination, dexterity, proprioception, sensation, tone, ROM, strength, flexibility, Fine motor control, Gross motor control, mobility, balance, endurance, decreased knowledge of use of DME, skin integrity, and UE functional use, cognitive skills including energy/drive, safety awareness, and sequencing, and psychosocial skills including coping strategies, environmental adaptation, and routines and behaviors.   IMPAIRMENTS: are limiting patient from ADLs, IADLs, rest and sleep, leisure, and social participation.   CO-MORBIDITIES: may have co-morbidities  that affects occupational performance. Patient will benefit from skilled OT to address above impairments and improve overall function.  REHAB POTENTIAL: Good   PLAN:  OT FREQUENCY: 1-2x/week  OT DURATION: 8 weeks  PLANNED INTERVENTIONS: self care/ADL training, therapeutic exercise, therapeutic activity, neuromuscular re-education, manual therapy, passive range of motion, balance training, functional mobility training, aquatic therapy, splinting, electrical stimulation, patient/family education, cognitive remediation/compensation, visual/perceptual remediation/compensation, energy conservation, coping strategies training, and DME and/or AE instructions  RECOMMENDED OTHER SERVICES: PT evaluation already completed.  CONSULTED AND AGREED WITH PLAN OF CARE: Patient and family member/caregiver  PLAN FOR NEXT SESSION:   Continue E-stim protocols and NRE for R UE AAROM  Supportive RUE treatment options Shoulder stability  ADL dressing trials and IADLS (cooking) PRN splinting checks - 11/19/22: OT recommended pt bring splint to next OT session d/t pt reporting discomfort at night.   Wynetta Emery, OT 11/19/2022, 11:52 AM

## 2022-11-19 NOTE — Therapy (Addendum)
OUTPATIENT PHYSICAL THERAPY NEURO TREATMENT- 10th VISIT PROGRESS NOTE   Patient Name: Jimmy Valentine MRN: [id number] DOB:[birth year], 72 y.o., male Today's Date: 11/19/2022   PCP: Wanda Plump, MD REFERRING PROVIDER: Wanda Plump, MD  Physical Therapy Progress Note   Dates of Reporting Period:09/30/22 - 11/19/22  See Note below for Objective Data and Assessment of Progress/Goals.  Thank you for the referral of this patient. Peter Congo, PT, DPT, CSRS   END OF SESSION:  PT End of Session - 11/19/22 1014     Visit Number 10    Number of Visits 17   Plus eval   Date for PT Re-Evaluation 12/09/22    Authorization Type [employer]    PT Start Time 1015    PT Stop Time 1104    PT Time Calculation (min) 49 min    Equipment Utilized During Treatment Gait belt    Activity Tolerance Patient tolerated treatment well    Behavior During Therapy WFL for tasks assessed/performed                    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,had a MRI, was prescribed testosterone by endocrinology   Sleep apnea    does't use cpap   Sudden hearing loss, left 2017   idiopathic sensorineural hearing loss   Past Surgical History:  Procedure Laterality Date   TOTAL KNEE ARTHROPLASTY Right 2005   TOTAL KNEE ARTHROPLASTY  12/04/2011   LEFT-- TOTAL KNEE ARTHROPLASTY;  Surgeon: Eugenia Mcalpine, MD;  Location: [facility];  Service: Orthopedics;  Laterality: Left;   Patient Active Problem List   Diagnosis Date Noted   Hemiparesis affecting right side as late effect of cerebrovascular accident (CVA) (HCC) 09/04/2022   Gait disturbance, post-stroke 09/04/2022   Hyponatremia 07/18/2022   Aphasia due to acute cerebrovascular accident (CVA) (HCC) 07/15/2022   Acute  ischemic left MCA stroke (HCC) 06/24/2022   Stroke (cerebrum) (HCC) 06/21/2022   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 10/18/2014   Elevated LFTs 04/13/2014   Diabetes (HCC) 09/03/2011   Gout 09/03/2011   OSA (obstructive sleep apnea) 04/22/2011   Annual physical exam 12/04/2010   Hypertension    Hyperlipidemia    Hypogonadism male    Erectile dysfunction     ONSET DATE: 09/23/2022 (referral)   REFERRING DIAG: W09.811 (ICD-10-CM) - Hemiparesis affecting right side as late effect of cerebrovascular accident (CVA) (HCC)  THERAPY DIAG:  Muscle weakness (generalized)  Hemiplegia and hemiparesis following cerebral infarction affecting right dominant side (HCC)  Other lack of coordination  Other abnormalities of gait and mobility  Unsteadiness on feet  Rationale for Evaluation and Treatment: Rehabilitation  SUBJECTIVE:  SUBJECTIVE STATEMENT:  Reports that he felt sore but recovered quickly after last session. Denies falls or any other acute changes.   Pt accompanied by:  Caregiver, [name] (in lobby)   PERTINENT HISTORY: L MCA CVA in May 2024   PAIN:  Are you having pain? No  PRECAUTIONS: Fall  WEIGHT BEARING RESTRICTIONS: No  FALLS: Has patient fallen in last 6 months? Yes. Number of falls 1  LIVING ENVIRONMENT: Lives with: lives alone Lives in: House/apartment Stairs: Yes: External: 3 steps; on right going up, on left going up, and can reach both Has following equipment at home: Walker - 2 wheeled, Wheelchair (manual), shower chair, bed side commode, and AFO  PLOF: Independent  PATIENT GOALS: "Get back to where I can walk by myself  without the walker and without assistance"   OBJECTIVE:   DIAGNOSTIC FINDINGS: MRI of brain on 06/22/22  IMPRESSION: 1. Positive for Left MCA territory lacunar type infarct tracking from the left corona radiata to the lentiform. Petechial hemorrhage (Heidelberg classification 1b: HI2). No malignant hemorrhagic transformation. No significant mass effect.   2. No other acute intracranial abnormality. 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  CT angio of head/neck on 06/21/22 IMPRESSION: 1. 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 The left MCA bi/trifurcation and left M2 branches remain patent.   2. Superimposed Severe intracranial and cervical ICA Atherosclerosis resulting in: - distal Right Vertebral Artery (V4) OCCLUSION. - distal Left Vertebral Artery Severe (V4) stenosis. - Right ICA origin RADIOGRAPHIC STRING SIGN stenosis due to calcified plaque. - Left ICA origin stenosis due to complex plaque estimated 65-70%. - Severe Left ICA supraclinoid stenosis due to calcified plaque.  COGNITION: Overall cognitive status: Within functional limits for tasks assessed   SENSATION: Pt denies numbness/tingling in RLE/RUE   COORDINATION: Heel to shin test: WNL on LLE, unable to perform hip flexion on RLE but able to achieve cross-legged position    MUSCLE TONE: Mild clonus in R foot that fatigues after 3s   POSTURE: rounded shoulders and forward head   LOWER EXTREMITY MMT:  Tested in seated position   MMT Right Eval Left Eval  Hip flexion 2- 4+  Hip extension    Hip abduction 1 4+  Hip adduction 2 4+  Hip internal rotation    Hip external rotation    Knee flexion 3 4+  Knee extension 2 4+  Ankle dorsiflexion 4- 4+  Ankle plantarflexion     Ankle inversion    Ankle eversion    (Blank rows = not tested)  BED MOBILITY:  Has a rail but requires assistance (up to max) to roll and perform sit <>supine   TRANSFERS: Assistive device utilized: Environmental consultant - 2 wheeled  Sit to stand: SBA Stand to sit: SBA Pt kickstands RLE and leans to L side to perform    GAIT: Gait pattern: step to pattern, decreased step length- Right, decreased stride length, decreased hip/knee flexion- Right, decreased ankle dorsiflexion- Right, Left hip hike, lateral lean- Left, and poor foot clearance- Right Distance walked: Various clinic distances  Assistive device utilized: Walker - 2 wheeled Level of assistance: CGA and Min A Comments: Pt not wearing AFO this date. Noted pt dragging RLE on ground, especially w/fatigue, and would lose balance anteriorly requiring min A to stabilize. Pt also  vaults to progress RLE and frequently tips RW over on L side.      TODAY'S TREATMENT:       TherAct- vitals WNL for therapy Vitals:   11/19/22 1023  BP: (!) 158/72  Pulse: 94    E-stim attended Bioness donned and set up for R anterior tibialis and hamstrings for improved ankle DF and facilitation of knee flexion w/ gait. See tablet 1 for details.  Gait training NMR Gait pattern: step through pattern, decreased hip/knee flexion- Right, decreased ankle dorsiflexion- Right, circumduction- Right, lateral lean- Left, narrow BOS, and poor foot clearance- Right Distance walked: 230 feet Assistive device utilized: Walker - 2 wheeled Level of assistance: CGA and Min A Comments: w/ Bioness donned to R hamstrings and anterior tibialis (no AFO), demonstrates improved limb clearance with use of Bioness FES, cues for wider steps w/ RLE to avoid catching R heel on L shoe (narrow stance causes instability in several instances-> min A to recover to standing balance)  Gait pattern: step through pattern, decreased hip/knee flexion- Right, decreased ankle dorsiflexion- Right,  circumduction- Right, lateral lean- Left, narrow BOS, and poor foot clearance- Right Distance walked: 115 feet Assistive device utilized: Hemi walker LUE Level of assistance: CGA and Min A Comments: w/ Bioness donned to R hamstrings and anterior tibialis (no AFO), demonstrates improved limb clearance with use of Bioness FES, pt feels less stable w/ single UE for support but still safe; onset of decreased DF, increased medial R foot placement, and increased L lateral lean with onset of fatigue ~50ft    TherAct Standing w/ LUE support on platform on mat table, no AFO, CGA Stepping forwards, to center, backwards, to center; 2 sets x8 reps bilaterally Performed w/ RLE stepping for hip, knee, and ankle mobility. Pt often dragging foot backwards, verbal cues for knee flexion Performed w/ LLE stepping for increased weightbearing on RLE, cued press R heel into floor and use UE only as necessary for safety                                                                                                              PATIENT EDUCATION:  Education details: Continue HEP Person educated: Patient Education method: Medical illustrator Education comprehension: verbalized understanding and returned demonstration  HOME EXERCISE PROGRAM:  Access Code: [email or web address] URL: [email or web address] Date: 11/12/2022 Prepared by: Alethia Berthold Plaster  Exercises - Supine Figure 4 Piriformis Stretch  - 1 x daily - 7 x weekly - 3 sets - 1-3 minute hold - Seated Piriformis Stretch  - 1 x daily - 7 x weekly - 3 sets - 1-3 minute hold  GOALS: Goals reviewed with patient? Yes  SHORT TERM GOALS: Target date: 10/28/2022    Pt will perform initial HEP w/min A from caregiver for improved strength, balance, transfers and gait.  Baseline: to be established  Goal status: IN PROGRESS  2.  Pt will improve gait velocity to at least 0.7 ft/s w/RW and CGA for improved gait efficiency and independence    Baseline: 0.49 ft/s w/RW  and min A; 0.66 ft/s w/RW and SBA Goal status: PARTIALLY MET   3.  Pt will be able to don and doff R hand orthotic on RW w/CGA for improved independence and weight shift to R side  Baseline: max A; min A; SBA (10/3)  Goal status: MET   4.  Pt will ambulate greater than or equal to 300 feet on with LRAD and CGA for improved cardiovascular endurance and BLE strength.  Baseline: 213 ft with RW and min A (9/17); 195' w/RW and SBA  Goal status: PARTIALLY MET   5.  Berg to be assessed and LTG written  Baseline: 25/56 Goal status: MET  6.  Pt will perform supine > sit EOB w/min A or use of rail for improved independence and functional strength Baseline: mod-max A; min A (9/26) Goal status: MET  LONG TERM GOALS: Target date: 11/25/2022   Pt will be independent with final HEP for improved strength, balance, transfers and gait.  Baseline:  Goal status: INITIAL  2.  Pt will improve gait velocity to at least 1.0 ft/s w/LRAD and SBA for improved gait efficiency and independence   Baseline:  Goal status: INITIAL  3.  Pt will ambulate greater than or equal to 400 feet on with LRAD and CGA for improved cardiovascular endurance and BLE strength.   Baseline: 213 ft with RW and min A (9/17)  Goal status: INITIAL  4.  Pt will improve Berg score to 34/56 for decreased fall risk  Baseline: 25/56 Goal status: REVISED  5.  Pt will perform bed mobility at CGA or SBA level for improved independence at home  Baseline: mod-max A Goal status: INITIAL   ASSESSMENT:  CLINICAL IMPRESSION:  Emphasis of skilled PT session on ambulation with Bioness to R hamstrings and R ant tib w/ RW and hemiwalker. Pt w/ increased R knee flexion and dorsiflexion with estim, tolerated hemiwalker trial well. When pt fatigues, R foot tends to drift more midline, becoming a tripping hazard when not fully stepped on floor, verbal cues for wider stance improve safety. Pt continues to  benefit from skilled therapy services to work on R hemibody strengthening in order to improve his balance and increase his safety and independence with functional mobility. Continue POC.  OBJECTIVE IMPAIRMENTS: Abnormal gait, decreased activity tolerance, decreased balance, decreased coordination, decreased mobility, difficulty walking, decreased strength, impaired tone, impaired UE functional use, improper body mechanics, and pain.   ACTIVITY LIMITATIONS: carrying, lifting, standing, squatting, stairs, transfers, bed mobility, bathing, toileting, dressing, self feeding, reach over head, hygiene/grooming, locomotion level, and caring for others  PARTICIPATION LIMITATIONS: meal prep, cleaning, laundry, medication management, personal finances, interpersonal relationship, driving, shopping, community activity, and yard work  PERSONAL FACTORS: Fitness, Past/current experiences, Transportation, and 1 comorbidity: CVA  are also affecting patient's functional outcome.   REHAB POTENTIAL: Good  CLINICAL DECISION MAKING: Evolving/moderate complexity  EVALUATION COMPLEXITY: Moderate  PLAN:  PT FREQUENCY: 2x/week  PT DURATION: 8 weeks (POC written for 10 weeks due to delay in scheduling)   PLANNED INTERVENTIONS: Therapeutic exercises, Therapeutic activity, Neuromuscular re-education, Balance training, Gait training, Patient/Family education, Self Care, Joint mobilization, Stair training, Orthotic/Fit training, DME instructions, Aquatic Therapy, Dry Needling, Electrical stimulation, Manual therapy, and Re-evaluation  PLAN FOR NEXT SESSION:  Will need +2 if doing tall kneel activities. Prone- decrease assist amount? continue with Bioness to R HS and ant tib; RLE NMR, weight shift to R side, work on bed mobility and donning/doffing RUE from hand orthotic and  donning/doffing AFO more independently; can trial hemiwalker ambulation- sling for RUE?, stepping activities at parallel/ballet bars    Beverely Low, [facility] [address] [place], [place]  [address] Phone:  [phone] Fax:  [phone]  11/19/2022, 11:28 AM

## 2022-11-24 ENCOUNTER — Ambulatory Visit: Payer: Medicare HMO | Admitting: Physical Therapy

## 2022-11-24 ENCOUNTER — Encounter: Payer: Self-pay | Admitting: Physical Therapy

## 2022-11-24 ENCOUNTER — Ambulatory Visit: Payer: Medicare HMO | Admitting: Occupational Therapy

## 2022-11-24 VITALS — BP 138/79 | HR 95

## 2022-11-24 DIAGNOSIS — M6281 Muscle weakness (generalized): Secondary | ICD-10-CM

## 2022-11-24 DIAGNOSIS — I69351 Hemiplegia and hemiparesis following cerebral infarction affecting right dominant side: Secondary | ICD-10-CM

## 2022-11-24 DIAGNOSIS — R2681 Unsteadiness on feet: Secondary | ICD-10-CM | POA: Diagnosis not present

## 2022-11-24 DIAGNOSIS — R278 Other lack of coordination: Secondary | ICD-10-CM

## 2022-11-24 DIAGNOSIS — R2689 Other abnormalities of gait and mobility: Secondary | ICD-10-CM

## 2022-11-24 DIAGNOSIS — S43111D Subluxation of right acromioclavicular joint, subsequent encounter: Secondary | ICD-10-CM

## 2022-11-24 NOTE — Therapy (Unsigned)
OUTPATIENT PHYSICAL THERAPY NEURO TREATMENT   Patient Name: Jimmy Valentine MRN: [id number] DOB:1950-05-21, 72 y.o., male Today's Date: 11/24/2022   PCP: Wanda Plump, MD REFERRING PROVIDER: Wanda Plump, MD    END OF SESSION:  PT End of Session - 11/24/22 1104     Visit Number 11    Number of Visits 17   Plus eval   Date for PT Re-Evaluation 12/09/22    Authorization Type [employer]    PT Start Time 1103   received from OT   PT Stop Time 1147    PT Time Calculation (min) 44 min    Equipment Utilized During Treatment Gait belt    Activity Tolerance Patient tolerated treatment well    Behavior During Therapy WFL for tasks assessed/performed                    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,had a MRI, was prescribed testosterone by endocrinology   Sleep apnea    does't use cpap   Sudden hearing loss, left 2017   idiopathic sensorineural hearing loss   Past Surgical History:  Procedure Laterality Date   TOTAL KNEE ARTHROPLASTY Right 2005   TOTAL KNEE ARTHROPLASTY  12/04/2011   LEFT-- TOTAL KNEE ARTHROPLASTY;  Surgeon: Eugenia Mcalpine, MD;  Location: [facility];  Service: Orthopedics;  Laterality: Left;   Patient Active Problem List   Diagnosis Date Noted   Hemiparesis affecting right side as late effect of cerebrovascular accident (CVA) (HCC) 09/04/2022   Gait disturbance, post-stroke 09/04/2022   Hyponatremia 07/18/2022   Aphasia due to acute cerebrovascular accident (CVA) (HCC) 07/15/2022   Acute ischemic left MCA stroke (HCC) 06/24/2022   Stroke (cerebrum) (HCC) 06/21/2022   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 10/18/2014   Elevated LFTs 04/13/2014   Diabetes (HCC) 09/03/2011   Gout 09/03/2011   OSA (obstructive sleep apnea) 04/22/2011   Annual physical exam 12/04/2010   Hypertension    Hyperlipidemia    Hypogonadism male    Erectile dysfunction     ONSET DATE: 09/23/2022 (referral)   REFERRING DIAG: I34.742 (ICD-10-CM) - Hemiparesis affecting right side as late effect of cerebrovascular accident (CVA) (HCC)  THERAPY DIAG:  Muscle weakness (generalized)  Hemiplegia and hemiparesis following cerebral infarction affecting right dominant side (HCC)  Other lack of coordination  Other abnormalities of gait and mobility  Unsteadiness on feet  Rationale for Evaluation and Treatment: Rehabilitation  SUBJECTIVE:  SUBJECTIVE STATEMENT:  Reports that he felt fine after last session.  He states a lot of times he does not wear his AFO at home and that the foot tends to do better not rotating out. Denies falls or any other acute changes.   Pt accompanied by:  Caregiver, [name] (in lobby)   PERTINENT HISTORY: L MCA CVA in May 2024   PAIN:  Are you having pain? No  PRECAUTIONS: Fall  WEIGHT BEARING RESTRICTIONS: No  FALLS: Has patient fallen in last 6 months? Yes. Number of falls 1  LIVING ENVIRONMENT: Lives with: lives alone Lives in: House/apartment Stairs: Yes: External: 3 steps; on right going up, on left going up, and can reach both Has following equipment at home: Walker - 2 wheeled, Wheelchair (manual), shower chair, bed side commode, and AFO  PLOF: Independent  PATIENT GOALS: "Get back to where I can walk by myself without the walker and without assistance"   OBJECTIVE:   DIAGNOSTIC FINDINGS: MRI of brain on 06/22/22  IMPRESSION: 1. Positive for Left MCA  territory lacunar type infarct tracking from the left corona radiata to the lentiform. Petechial hemorrhage (Heidelberg classification 1b: HI2). No malignant hemorrhagic transformation. No significant mass effect.   2. No other acute intracranial abnormality. 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  CT angio of head/neck on 06/21/22 IMPRESSION: 1. 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 The left MCA bi/trifurcation and left M2 branches remain patent.   2. Superimposed Severe intracranial and cervical ICA Atherosclerosis resulting in: - distal Right Vertebral Artery (V4) OCCLUSION. - distal Left Vertebral Artery Severe (V4) stenosis. - Right ICA origin RADIOGRAPHIC STRING SIGN stenosis due to calcified plaque. - Left ICA origin stenosis due to complex plaque estimated 65-70%. - Severe Left ICA supraclinoid stenosis due to calcified plaque.  COGNITION: Overall cognitive status: Within functional limits for tasks assessed   SENSATION: Pt denies numbness/tingling in RLE/RUE   COORDINATION: Heel to shin test: WNL on LLE, unable to perform hip flexion on RLE but able to achieve cross-legged position    MUSCLE TONE: Mild clonus in R foot that fatigues after 3s   POSTURE: rounded shoulders and forward head   LOWER EXTREMITY MMT:  Tested in seated position   MMT Right Eval Left Eval  Hip flexion 2- 4+  Hip extension    Hip abduction 1 4+  Hip adduction 2 4+  Hip internal rotation    Hip external rotation    Knee flexion 3 4+  Knee extension 2 4+  Ankle dorsiflexion 4- 4+  Ankle plantarflexion    Ankle inversion    Ankle eversion    (Blank rows = not tested)  BED MOBILITY:  Has a rail but requires assistance (up to max) to roll and  perform sit <>supine   TRANSFERS: Assistive device utilized: Environmental consultant - 2 wheeled  Sit to stand: SBA Stand to sit: SBA Pt kickstands RLE and leans to L side to perform    GAIT: Gait pattern: step to pattern, decreased step length- Right, decreased stride length, decreased hip/knee flexion- Right, decreased ankle dorsiflexion- Right, Left hip hike, lateral lean- Left, and poor foot clearance- Right Distance walked: Various clinic distances  Assistive device utilized: Walker - 2 wheeled Level of assistance: CGA and Min A Comments: Pt not wearing  AFO this date. Noted pt dragging RLE on ground, especially w/fatigue, and would lose balance anteriorly requiring min A to stabilize. Pt also vaults to progress RLE and frequently tips RW over on L side.      TODAY'S TREATMENT:       TherAct- vitals WNL for therapy Vitals:   11/24/22 1106  BP: 138/79  Pulse: 95   Gait training NMR Gait pattern: step through pattern, decreased hip/knee flexion- Right, decreased ankle dorsiflexion- Right, circumduction- Right, lateral lean- Left, narrow BOS, and poor foot clearance- Right Distance walked: 115 feet Assistive device utilized: Hemi walker LUE Level of assistance: CGA and Min A Comments: W/o Bioness, cued for HW placement, facilitated lateral trunk weight shift and maintaining R upper trunk from collapsing towards midline during compensation in swing phase   -Attempted setup of Bioness for Therex/NMR to hamstrings and DF, but pt has 2 profiles on Bioness 1 tablet both of which would not populate with electrode pairing today.  Several attempts and switching to alternative tablet without pt profile available on tablet 2.  Did explain to patient situation and had pt practice doffing AFO and right shoe w/ supervision.  -Practiced bed mobility: Sitting EOM <> supine using right lean into supine and returning to upright requiring minA and cues to increase LUE reach across and push into upright sitting.   Practiced rolling left and right x1 each w/ cues for form and protection of RUE especially into left side-lying.  Pt denies pain when lying on R shoulder. -In supine: -Heel slides 2x15 RLE only, compensatory hip flexor synergy -Supine bridges w/ PT facilitating hip approximation and preventing abduction of RLE 2x10 -AAROM marching 2x20 RLE only, tone more notable here -Bilateral hamstring curls on red theraball x20 w/ PT maintaining R foot position and using tapping to engage hamstring as able  -PT ambulates with pt to front of clinic cuing to prevent toe drag due to fatigue and poor RLE clearance, using 2WW w/ hand splint.                                                                                                             PATIENT EDUCATION:  Education details: Continue HEP. Person educated: Patient Education method: Medical illustrator Education comprehension: verbalized understanding and returned demonstration  HOME EXERCISE PROGRAM:  Access Code: [email or web address] URL: [email or web address] Date: 11/12/2022 Prepared by: Alethia Berthold Plaster  Exercises - Supine Figure 4 Piriformis Stretch  - 1 x daily - 7 x weekly - 3 sets - 1-3 minute hold - Seated Piriformis Stretch  - 1 x daily - 7 x weekly - 3 sets - 1-3 minute hold  GOALS: Goals reviewed with patient? Yes  SHORT TERM GOALS: Target date: 10/28/2022    Pt will perform initial HEP w/min A from caregiver for improved strength, balance, transfers and gait.  Baseline: to be established  Goal status: IN PROGRESS  2.  Pt will improve gait velocity to at least 0.7 ft/s w/RW and CGA for improved gait efficiency and independence   Baseline: 0.49  ft/s w/RW and min A; 0.66 ft/s w/RW and SBA Goal status: PARTIALLY MET   3.  Pt will be able to don and doff R hand orthotic on RW w/CGA for improved independence and weight shift to R side  Baseline: max A; min A; SBA (10/3)  Goal status: MET   4.  Pt will ambulate  greater than or equal to 300 feet on with LRAD and CGA for improved cardiovascular endurance and BLE strength.  Baseline: 213 ft with RW and min A (9/17); 195' w/RW and SBA  Goal status: PARTIALLY MET   5.  Berg to be assessed and LTG written  Baseline: 25/56 Goal status: MET  6.  Pt will perform supine > sit EOB w/min A or use of rail for improved independence and functional strength Baseline: mod-max A; min A (9/26) Goal status: MET  LONG TERM GOALS: Target date: 11/25/2022   Pt will be independent with final HEP for improved strength, balance, transfers and gait.  Baseline:  Goal status: INITIAL  2.  Pt will improve gait velocity to at least 1.0 ft/s w/LRAD and SBA for improved gait efficiency and independence   Baseline:  Goal status: INITIAL  3.  Pt will ambulate greater than or equal to 400 feet on with LRAD and CGA for improved cardiovascular endurance and BLE strength.   Baseline: 213 ft with RW and min A (9/17)  Goal status: INITIAL  4.  Pt will improve Berg score to 34/56 for decreased fall risk  Baseline: 25/56 Goal status: REVISED  5.  Pt will perform bed mobility at CGA or SBA level for improved independence at home  Baseline: mod-max A Goal status: INITIAL   ASSESSMENT:  CLINICAL IMPRESSION:  Attempted Bioness for NMR and therapeutic exercises this visit, but Bioness tablet 1 not allowing patient profile to be downloaded.  Focused on general hamstring activation for improved ROM and strengthening.  He continues to be limited by right foot placement during gait even with AFO donned.  Will continue per POC to address deficits as outlined in ongoing POC.  OBJECTIVE IMPAIRMENTS: Abnormal gait, decreased activity tolerance, decreased balance, decreased coordination, decreased mobility, difficulty walking, decreased strength, impaired tone, impaired UE functional use, improper body mechanics, and pain.   ACTIVITY LIMITATIONS: carrying, lifting,  standing, squatting, stairs, transfers, bed mobility, bathing, toileting, dressing, self feeding, reach over head, hygiene/grooming, locomotion level, and caring for others  PARTICIPATION LIMITATIONS: meal prep, cleaning, laundry, medication management, personal finances, interpersonal relationship, driving, shopping, community activity, and yard work  PERSONAL FACTORS: Fitness, Past/current experiences, Transportation, and 1 comorbidity: CVA  are also affecting patient's functional outcome.   REHAB POTENTIAL: Good  CLINICAL DECISION MAKING: Evolving/moderate complexity  EVALUATION COMPLEXITY: Moderate  PLAN:  PT FREQUENCY: 2x/week  PT DURATION: 8 weeks (POC written for 10 weeks due to delay in scheduling)   PLANNED INTERVENTIONS: Therapeutic exercises, Therapeutic activity, Neuromuscular re-education, Balance training, Gait training, Patient/Family education, Self Care, Joint mobilization, Stair training, Orthotic/Fit training, DME instructions, Aquatic Therapy, Dry Needling, Electrical stimulation, Manual therapy, and Re-evaluation  PLAN FOR NEXT SESSION:  Will need +2 if doing tall kneel activities. Prone- decrease assist amount? continue with Bioness to R HS and ant tib; RLE NMR, weight shift to R side, work on bed mobility and donning/doffing RUE from hand orthotic and donning/doffing AFO more independently; can trial hemiwalker ambulation- sling for RUE?, stepping activities at parallel/ballet bars    Camille Bal, PT, DPT   [facility] [address]  [address] [place], [place]  [address] Phone:  [phone] Fax:  [phone]  11/24/2022, 11:54 AM

## 2022-11-24 NOTE — Therapy (Unsigned)
OUTPATIENT OCCUPATIONAL THERAPY NEURO TREATMENT  Patient Name: Jimmy Valentine MRN: [id number] DOB:1950-12-08, 72 y.o., male Today's Date: 11/24/2022  PCP: Wanda Plump, MD REFERRING PROVIDER: Wanda Plump, MD  END OF SESSION:  OT End of Session - 11/24/22 1022     Visit Number 11    Number of Visits 16   + evaluaiton   Date for OT Re-Evaluation 12/11/22    Authorization Type [employer] MC 2024 Met VL: [place] Auth Not Reqd    OT Start Time 1020    OT Stop Time 1100    OT Time Calculation (min) 40 min    Activity Tolerance Patient tolerated treatment well    Behavior During Therapy WFL for tasks assessed/performed               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,had a MRI, was prescribed testosterone by endocrinology   Sleep apnea    does't use cpap   Sudden hearing loss, left 2017   idiopathic sensorineural hearing loss   Past Surgical History:  Procedure Laterality Date   TOTAL KNEE ARTHROPLASTY Right 2005   TOTAL KNEE ARTHROPLASTY  12/04/2011   LEFT-- TOTAL KNEE ARTHROPLASTY;  Surgeon: Eugenia Mcalpine, MD;  Location: [facility];  Service: Orthopedics;  Laterality: Left;   Patient Active Problem List   Diagnosis Date Noted   Hemiparesis affecting right side as late effect of cerebrovascular accident (CVA) (HCC) 09/04/2022   Gait disturbance, post-stroke 09/04/2022   Hyponatremia 07/18/2022   Aphasia due to acute cerebrovascular accident (CVA) (HCC) 07/15/2022   Acute ischemic left MCA stroke (HCC) 06/24/2022   Stroke (cerebrum) (HCC) 06/21/2022   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  Pt reports things are going well.  He brought his splint but reports it is fitting well - it's just not comfortable to wear all night and so he tries to wear it during the daytime also.    Pt accompanied by: self  PERTINENT HISTORY:  Presented to [facility] ED on 06/21/2022 with sudden onset of right-sided weakness, dysarthria and facial drooping. MRI showed large MCA territory basal ganglia infarct with petechial hemorrhage with no hemorrhagic transformation or mass effect.   Inpatient Rehab: 06/24/2022 - 07/30/2022 Patient discharged at [facility] Assist level.  Patient's care partners able to provide the necessary  physical assistance at discharge.   Recommendation:  Patient will benefit from ongoing skilled OT services in home health setting to continue to advance functional skills in the area of BADL, iADL, and Reduce care partner burden. Equipment: Shower chair, commode, RW, R RW hand splint, R resting hand splint, LH sponge, Dycem   Home Health therapies 07/24 - 08/24     PRECAUTIONS: Fall  WEIGHT BEARING RESTRICTIONS: No  PAIN:  Are you having pain? No   FALLS: Has patient fallen in last 6  months? Yes. Number of falls 1x - fell on his bottom about a month ago - got the walker stuck on the doorframe  LIVING ENVIRONMENT: Lives with: lives alone and Caregiver 3-4 days/week x 12 hours, daughter, son and brother-in-law assist on other days Lives in: House/apartment Stairs: Yes: External: 3-5 steps; can reach both Has following equipment at home: Environmental consultant - 2 wheeled, Wheelchair (manual), Shower bench, bed side commode, Grab bars, and urinal  PLOF: Independent - still driving, played golf, retired Immunologist with lots traveling (driving)  PATIENT GOALS: To get my right side back working again  OBJECTIVE:   HAND DOMINANCE: Right (affected side)  ADLs: Overall ADLs: Min to mod assistance Transfers/ambulation related to ADLs: Supervision Eating: Gets help to cut food as needed, having to use L hand Grooming: Used regular razor yesterday for the first time UB Dressing: Needs help to get R arm in sleeve LB Dressing: Can pull pants up most of the time Toileting: Most of the time he is able to perform on his own; uses urinal at bedside at night Bathing: "50/50" with caregiver Tub Shower transfers: Gets help to get his leg into the tub Equipment: Transfer tub bench, Grab bars, bed side commode, and Long handled sponge  IADLs: Shopping: caregiver and family (daughter) Light housekeeping: Caregiver Meal Prep: Psychologist, counselling mobility: RW with hand orthosis & dycem + strap for R hand Medication management: Assistance from caregivers Financial management: Assistance from family Handwriting: unable with R UE; not assessed with L UE  MOBILITY STATUS: Needs Assist: Help to get his R UE/hand on the handle of his walker and Hx of falls  POSTURE COMMENTS:  rounded shoulders and forward head Sitting balance:  WFL in arm chair at table  ACTIVITY TOLERANCE: Activity tolerance: Limited due to R hemiplegia  FUNCTIONAL OUTCOME MEASURES: PSFS - 1.0  UPPER EXTREMITY ROM:     Active ROM Right eval Left eval  Shoulder flexion  [facility]  Shoulder abduction    Shoulder adduction    Shoulder extension min   Shoulder internal rotation    Shoulder external rotation    Elbow flexion min   Elbow extension    Wrist flexion min   Wrist extension    Wrist ulnar deviation    Wrist radial deviation    Wrist pronation    Wrist supination    (Blank rows = not tested)  UPPER EXTREMITY MMT:     MMT Right eval Left eval  Shoulder flexion  4/5  Shoulder abduction    Shoulder adduction    Shoulder extension    Shoulder internal rotation    Shoulder external rotation    Middle trapezius    Lower trapezius    Elbow flexion    Elbow extension    Wrist flexion    Wrist extension    Wrist ulnar deviation    Wrist radial deviation    Wrist pronation    Wrist supination    (Blank rows =  not tested)  HAND FUNCTION: Grip strength: Right: 3.9, 3.7, 5.2 lbs; Left: 34.8, 36.5, 37.9 lbs Average: Right 4.3 lbs Left 36.4 lbs  COORDINATION: Unable to perform R UE coordination tasks,   SENSATION: WFL  EDEMA: Slight RUE  MUSCLE TONE: RUE: Hypotonic and Flaccid  COGNITION: Overall cognitive status: Within functional limits for tasks assessed and h/o more severe aphasia and does self-report need to think about what he is trying to say etc  VISION: Subjective report: Eye Dr ~ 3 weeks Baseline vision: Wears glasses for reading only Visual history: cataracts  VISION ASSESSMENT: TBA  Patient is not having difficulty with activities due to vision.  PERCEPTION: Not tested  PRAXIS: Not tested  OBSERVATIONS: Pt ambulated into clinic w/RW and R hand orthosis. Patient was wearing his AFO which he stated he stopped wearing after he finished home health therapy.   TODAY'S TREATMENT:                                                                                                                               Neuro Re-Ed Neuromuscular reeducation provided, including  specific visual, tactile and verbal cues and physical assistance to stimulate the neuromuscular system and promote functional A/AROM, movement and use of R UE for activities such as grasp and release activities ie) to hold, stack and manipulate cones as well as elbow flexion/extension and shoulder elevation/flexion and extension to move items.  Pt able to perform some elbow flexion more on his own today.  RUE initially supported by OTR but as motion was practiced, he was able to perform motion himself ie) lift, move hand towards cone and slide hand down the cone, grasp it and pick it up to move it.  He does use use compensatory trunk motions to help get his arm in position but he is able to see and feel progression of RUE active movements without constant physical assist from OT.  RUE ROM conducted on tabletop and with R hand atop cane on floor to improve AROM, decrease shoulder subluxation and risk for pain, prevent contractures and promote max neuro return for appropriate assistive device - OT educated pt about attempting motions with RUE first before LUE assistance, and pt verbalized and demo'd understanding.  Also used camera on phone set to selfie for increased visual regard of correct engagement and alignment of shoulder elevation.   PATIENT EDUCATION: Education details: Continued RUE HEP ideas Person educated: Patient Education method: Explanation, Demonstration, Tactile cues, and Verbal cues Education comprehension: verbalized understanding, returned demonstration, verbal cues required, tactile cues required, and needs further education  HOME EXERCISE PROGRAM:  10/20/22 - UE ROM (grip, forearm, elbow flex, shoulder shrugs) - no images provided only written suggestions. 10/27/2022: updated ROM HEP Access Code [email or web address] 11/03/22 - sleep positioning handout provided  GOALS: Goals reviewed with patient? Yes  SHORT TERM GOALS: Target date: 11/13/22  Patient will demonstrate updated RUE HEP with  25% verbal cues or less for  proper execution.  Baseline: New to outpatient OT.  Self reported difficulty with motivation at home Goal status: IN progress 11/19/22 - Pt reported completing HEP. Pt demo'd understanding of exercises with approx. 40% v/c.  2.  Patient will demonstrate at least 8+ lbs RUE grip strength as needed to stabilize container on tabletop/hold large handled object.  Baseline: Right 4.3 lbs Goal status: IN progress 11/19/22 - Right: 5 lbs, with support from OT and pt's LUE for initial grasp positioning of RUE  3.  Patient will be able to don T-shirt without physical assistance x 5 days in a row. Baseline: Caregiver assistance to get R UE in sleeve Goal status: IN Progress 11/19/22 - Pt dons t-shirt on LUE and head, then caregiver provides assistance for RUE in sleeve.  4.  Patient will improve R UE AROM for shoulder elevation and elbow flexion to lift arm/hand to walker.  Baseline: Caregiver assist or uses LUE.  Trace elbow/shoulder movement Goal status: IN Progress 11/19/22 - Pt reports "sometimes" placing RUE on walker without LUE assistance though positioning requires full body compensatory movements. Pt primarily continues to use LUE.  5.  Patient will be assisted to don/doff appropriate splints/positioning devices to minimize subluxation and minimize risk of RUE/hand contracture. Baseline: None place Goal status: IN Progress 11/19/22 - Pt reports wearing splint at least 2-3 hours per day, not currently wearing at night d/t discomfort.    LONG TERM GOALS: Target date: 12/11/22  Patient will demonstrate updated RUE HEP with visual schedule and images for proper execution and frequency. Baseline: New to outpatient OT.  Self reported difficulty with motivation at home. Goal status: IN Progress  2.  Patient will demonstrate at least 10 lbs RUE grip strength as needed to hold objects for manipulation by LUE. Baseline: Right 4.3 lbs Goal status: IN Progress  3.   Patient will be able to get to EOB with no physical assistance to toilet with BSC as needed. Baseline: Assistance to get to EOB from caregivers. Goal status: IN Progress 11/19/22 - Pt continues to use urinal in bed. Pt reports improved ability to ind transition to EOB and standing.   4.  Patient will be able to prepare simple food items with appropriate AE (small crock pot, microwave, toaster oven etc) Baseline: Caregiver/family perform meal prep  Goal status: IN Progress 11/12/22 - showed pt walker tray for home use  5.  Patient will report at least two-point increase in average PSFS score or at least three-point increase in a single activity score indicating functionally significant improvement given minimum detectable change. Baseline: 1.0 total score (See above for individual activity scores)  Goal status: IN Progress   ASSESSMENT:  CLINICAL IMPRESSION: Patient is a 72 y.o. male who was seen today for occupational therapy evaluation for R hemiplegia.  Continues pt training and education regarding RUE HEP ideas for A/AROM. Pt continuing to benefit from skilled OT services in the outpatient setting to work on RUE impairments to return pt to highest neurological return of RUE for max independence, safety and functional B coordination as able.      PERFORMANCE DEFICITS: in functional skills including ADLs, coordination, dexterity, proprioception, sensation, tone, ROM, strength, flexibility, Fine motor control, Gross motor control, mobility, balance, endurance, decreased knowledge of use of DME, skin integrity, and UE functional use, cognitive skills including energy/drive, safety awareness, and sequencing, and psychosocial skills including coping strategies, environmental adaptation, and routines and behaviors.   IMPAIRMENTS: are limiting patient from ADLs, IADLs, rest and  sleep, leisure, and social participation.   CO-MORBIDITIES: may have co-morbidities  that affects occupational  performance. Patient will benefit from skilled OT to address above impairments and improve overall function.  REHAB POTENTIAL: Good   PLAN:  OT FREQUENCY: 1-2x/week  OT DURATION: 8 weeks  PLANNED INTERVENTIONS: self care/ADL training, therapeutic exercise, therapeutic activity, neuromuscular re-education, manual therapy, passive range of motion, balance training, functional mobility training, aquatic therapy, splinting, electrical stimulation, patient/family education, cognitive remediation/compensation, visual/perceptual remediation/compensation, energy conservation, coping strategies training, and DME and/or AE instructions  RECOMMENDED OTHER SERVICES: PT evaluation already completed.  CONSULTED AND AGREED WITH PLAN OF CARE: Patient and family member/caregiver  PLAN FOR NEXT SESSION:   Continue E-stim protocols and NRE for R UE AAROM  Supportive RUE treatment options Shoulder stability/weightbearing  ADL dressing trials and IADLS (cooking) etc in standing PRN splinting checks   Victorino Sparrow, OT 11/24/2022, 12:01 PM

## 2022-11-26 ENCOUNTER — Ambulatory Visit: Payer: Medicare HMO | Admitting: Physical Therapy

## 2022-11-26 ENCOUNTER — Encounter: Payer: Medicare HMO | Admitting: Occupational Therapy

## 2022-12-01 ENCOUNTER — Ambulatory Visit: Payer: Medicare HMO | Admitting: Occupational Therapy

## 2022-12-01 ENCOUNTER — Ambulatory Visit: Payer: Medicare HMO | Admitting: Physical Therapy

## 2022-12-01 VITALS — BP 140/76 | HR 96

## 2022-12-01 DIAGNOSIS — M6281 Muscle weakness (generalized): Secondary | ICD-10-CM

## 2022-12-01 DIAGNOSIS — R278 Other lack of coordination: Secondary | ICD-10-CM

## 2022-12-01 DIAGNOSIS — R2681 Unsteadiness on feet: Secondary | ICD-10-CM

## 2022-12-01 DIAGNOSIS — I69351 Hemiplegia and hemiparesis following cerebral infarction affecting right dominant side: Secondary | ICD-10-CM

## 2022-12-01 DIAGNOSIS — S43111D Subluxation of right acromioclavicular joint, subsequent encounter: Secondary | ICD-10-CM

## 2022-12-01 DIAGNOSIS — R2689 Other abnormalities of gait and mobility: Secondary | ICD-10-CM

## 2022-12-01 NOTE — Therapy (Cosign Needed)
OUTPATIENT PHYSICAL THERAPY NEURO TREATMENT- RE-CERTIFICATION   Patient Name: Jimmy Valentine MRN: [id number] DOB:04/10/1950, 72 y.o., male Today's Date: 12/01/2022    PCP: Wanda Plump, MD REFERRING PROVIDER: Wanda Plump, MD    END OF SESSION:  PT End of Session - 12/01/22 1150     Visit Number 12    Number of Visits 25   recert   Date for PT Re-Evaluation 01/26/23   to allow for delays in scheduling   Authorization Type [employer]    PT Start Time 1150    PT Stop Time 1229    PT Time Calculation (min) 39 min    Equipment Utilized During Treatment Gait belt    Activity Tolerance Patient tolerated treatment well    Behavior During Therapy [facility] for tasks assessed/performed                     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,had a MRI, was prescribed testosterone by endocrinology   Sleep apnea    does't use cpap   Sudden hearing loss, left 2017   idiopathic sensorineural hearing loss   Past Surgical History:  Procedure Laterality Date   TOTAL KNEE ARTHROPLASTY Right 2005   TOTAL KNEE ARTHROPLASTY  12/04/2011   LEFT-- TOTAL KNEE ARTHROPLASTY;  Surgeon: Eugenia Mcalpine, MD;  Location: [facility];  Service: Orthopedics;  Laterality: Left;   Patient Active Problem List   Diagnosis Date Noted   Hemiparesis affecting right side as late effect of cerebrovascular accident (CVA) (HCC) 09/04/2022   Gait disturbance, post-stroke 09/04/2022   Hyponatremia 07/18/2022   Aphasia due to acute cerebrovascular accident (CVA) (HCC) 07/15/2022   Acute ischemic left MCA stroke (HCC) 06/24/2022   Stroke (cerebrum) (HCC) 06/21/2022   Chronic mycotic otitis externa 10/11/2019   Conductive hearing loss of left ear 01/05/2019   Sensorineural hearing  loss (SNHL), bilateral 09/24/2016    PCP NOTES >>>>>>>>>>>>>>>>>>. 10/18/2014   Elevated LFTs 04/13/2014   Diabetes (HCC) 09/03/2011   Gout 09/03/2011   OSA (obstructive sleep apnea) 04/22/2011   Annual physical exam 12/04/2010   Hypertension    Hyperlipidemia    Hypogonadism male    Erectile dysfunction     ONSET DATE: 09/23/2022 (referral)   REFERRING DIAG: X91.478 (ICD-10-CM) - Hemiparesis affecting right side as late effect of cerebrovascular accident (CVA) (HCC)  THERAPY DIAG:  Muscle weakness (generalized)  Hemiplegia and hemiparesis following cerebral infarction affecting right dominant side (HCC)  Other abnormalities of gait and mobility  Unsteadiness on feet  Rationale for Evaluation and Treatment: Rehabilitation  SUBJECTIVE:  SUBJECTIVE STATEMENT:  Larey Seat forwards last Thursday, landed on floor, had to call neighbor to help recover from fall. "I have no idea" when asked why or if there was a cause. L sided bruising on hand and upper thigh present today.   Pt accompanied by:  Caregiver, [name] (in lobby)   PERTINENT HISTORY: L MCA CVA in May 2024   PAIN:  Are you having pain? No  PRECAUTIONS: Fall  WEIGHT BEARING RESTRICTIONS: No  FALLS: Has patient fallen in last 6 months? Yes. Number of falls 1  LIVING ENVIRONMENT: Lives with: lives alone Lives in: House/apartment Stairs: Yes: External: 3 steps; on right going up, on left going up, and can reach both Has following equipment at home: Walker - 2 wheeled, Wheelchair (manual), shower chair, bed side commode, and AFO  PLOF: Independent  PATIENT GOALS: "Get back to where I can walk by myself without the walker and without assistance"   OBJECTIVE:   DIAGNOSTIC FINDINGS: MRI of brain on 06/22/22  IMPRESSION: 1. Positive  for Left MCA territory lacunar type infarct tracking from the left corona radiata to the lentiform. Petechial hemorrhage (Heidelberg classification 1b: HI2). No malignant hemorrhagic transformation. No significant mass effect.   2. No other acute intracranial abnormality. 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  CT angio of head/neck on 06/21/22 IMPRESSION: 1. 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 The left MCA bi/trifurcation and left M2 branches remain patent.   2. Superimposed Severe intracranial and cervical ICA Atherosclerosis resulting in: - distal Right Vertebral Artery (V4) OCCLUSION. - distal Left Vertebral Artery Severe (V4) stenosis. - Right ICA origin RADIOGRAPHIC STRING SIGN stenosis due to calcified plaque. - Left ICA origin stenosis due to complex plaque estimated 65-70%. - Severe Left ICA supraclinoid stenosis due to calcified plaque.  COGNITION: Overall cognitive status: Within functional limits for tasks assessed   SENSATION: Pt denies numbness/tingling in RLE/RUE   COORDINATION: Heel to shin test: WNL on LLE, unable to perform hip flexion on RLE but able to achieve cross-legged position    MUSCLE TONE: Mild clonus in R foot that fatigues after 3s   POSTURE: rounded shoulders and forward head   LOWER EXTREMITY MMT:  Tested in seated position   MMT Right Eval Left Eval  Hip flexion 2- 4+  Hip extension    Hip abduction 1 4+  Hip adduction 2 4+  Hip internal rotation    Hip external rotation    Knee flexion 3 4+  Knee extension 2 4+  Ankle dorsiflexion 4- 4+  Ankle plantarflexion    Ankle inversion    Ankle eversion    (Blank rows = not tested)  BED MOBILITY:  Has a rail but requires assistance (up to max) to  roll and perform sit <>supine   TRANSFERS: Assistive device utilized: Environmental consultant - 2 wheeled  Sit to stand: SBA Stand to sit: SBA Pt kickstands RLE and leans to L side to perform    GAIT: Gait pattern: step to pattern, decreased step length- Right, decreased stride length, decreased hip/knee flexion- Right, decreased ankle dorsiflexion- Right, Left hip hike, lateral lean- Left, and poor foot clearance- Right Distance walked: Various clinic distances  Assistive device utilized: Walker - 2 wheeled Level of assistance: CGA and Min A Comments: Pt not wearing AFO this  date. Noted pt dragging RLE on ground, especially w/fatigue, and would lose balance anteriorly requiring min A to stabilize. Pt also vaults to progress RLE and frequently tips RW over on L side.      TODAY'S TREATMENT:       TherAct- vitals WNL for therapy Vitals:   12/01/22 1153  BP: (!) 140/76  Pulse: 96     [facility] PT Assessment - 12/01/22 1213       Ambulation/Gait   Gait velocity 32.8' over 54.69 s= 0.60 ft/s   RW, close SBA     6 minute walk test results    Aerobic Endurance Distance Walked 208      Berg Balance Test   Sit to Stand Able to stand without using hands and stabilize independently    Standing Unsupported Able to stand safely 2 minutes    Sitting with Back Unsupported but Feet Supported on Floor or Stool Able to sit safely and securely 2 minutes    Stand to Sit Sits safely with minimal use of hands    Transfers Able to transfer safely, definite need of hands    Standing Unsupported with Eyes Closed Able to stand 10 seconds with supervision    Standing Unsupported with Feet Together Able to place feet together independently and stand for 1 minute with supervision    From Standing, Reach Forward with Outstretched Arm Can reach forward >12 cm safely (5")    From Standing Position, Pick up Object from Floor Able to pick up shoe, needs supervision    From Standing Position, Turn to Look Behind Over each  Shoulder Turn sideways only but maintains balance    Turn 360 Degrees Needs close supervision or verbal cueing    Standing Unsupported, Alternately Place Feet on Step/Stool Needs assistance to keep from falling or unable to try   not attempted, safety   Standing Unsupported, One Foot in Front Loses balance while stepping or standing    Standing on One Leg Tries to lift leg/unable to hold 3 seconds but remains standing independently    Total Score 35    Berg comment: high fall risk                                                                                                                         PATIENT EDUCATION:  Education details: Continue HEP. Person educated: Patient Education method: Medical illustrator Education comprehension: verbalized understanding and returned demonstration  HOME EXERCISE PROGRAM:  Access Code: [email or web address] URL: [email or web address] Date: 11/12/2022 Prepared by: Alethia Berthold Plaster  Exercises - Supine Figure 4 Piriformis Stretch  - 1 x daily - 7 x weekly - 3 sets - 1-3 minute hold - Seated Piriformis Stretch  - 1 x daily - 7 x weekly - 3 sets - 1-3 minute hold  GOALS: Goals reviewed with patient? Yes  SHORT TERM GOALS: Target date: 10/28/2022    Pt will perform initial HEP w/min A from caregiver for improved strength, balance, transfers  and gait.  Baseline: to be established  Goal status: IN PROGRESS  2.  Pt will improve gait velocity to at least 0.7 ft/s w/RW and CGA for improved gait efficiency and independence   Baseline: 0.49 ft/s w/RW and min A; 0.66 ft/s w/RW and SBA Goal status: PARTIALLY MET   3.  Pt will be able to don and doff R hand orthotic on RW w/CGA for improved independence and weight shift to R side  Baseline: max A; min A; SBA (10/3)  Goal status: MET   4.  Pt will ambulate greater than or equal to 300 feet on with LRAD and CGA for improved cardiovascular endurance and BLE strength.  Baseline: 213  ft with RW and min A (9/17); 195' w/RW and SBA  Goal status: PARTIALLY MET   5.  Berg to be assessed and LTG written  Baseline: 25/56 Goal status: MET  6.  Pt will perform supine > sit EOB w/min A or use of rail for improved independence and functional strength Baseline: mod-max A; min A (9/26) Goal status: MET  LONG TERM GOALS: Target date: 11/25/2022    Pt will be independent with final HEP for improved strength, balance, transfers and gait.  Baseline: "going alright" Goal status: In Progress  2.  Pt will improve gait velocity to at least 1.0 ft/s w/LRAD and SBA for improved gait efficiency and independence   Baseline: 0.49 ft/s w/RW and min A; 0.66 ft/s w/RW and SBA, 0.60 ft/s w/ RW and close SBA (12/01/22) Goal status: NOT MET  3.  Pt will ambulate greater than or equal to 400 feet on with LRAD and CGA for improved cardiovascular endurance and BLE strength.   Baseline: 213 ft with RW and min A (9/17), 195' w/RW and SBA, 208 with RW and CGA (12/01/22) Goal status: NOT MET  4.  Pt will improve Berg score to 34/56 for decreased fall risk  Baseline: 25/56, 35/56 (12/01/22) Goal status: MET  5.  Pt will perform bed mobility at CGA or SBA level for improved independence at home  Baseline: mod-max A, Mod I Goal status: MET    SHORT TERM GOALS=  12/22/2022   1.  Pt will improve gait velocity to at least 0.75 ft/s w/ LRAD and SBA for improved gait efficiency and independence. Baseline: 0.60 ft/s w/ RW and close SBA (12/01/22) Goal status: INITIAL   RECERTIFICATION LONG TERM GOALS:  Target date: 01/12/2023   Pt will improve gait velocity to at least 1.0 ft/s w/ LRAD and SBA for improved gait efficiency and independence. Baseline: 0.60 ft/s w/ RW and close SBA (12/01/22) Goal status: INITIAL  2.  Pt will improve Berg score to 39/56 for decreased fall risk Baseline: 35/56 (12/01/22) Goal status: INITIAL  3.  Pt will ambulate greater than or equal to 400 feet on  with LRAD and CGA for improved cardiovascular endurance and BLE strength.  Baseline: 208 FT with RW and CGA (12/01/22) Goal status: INITIAL  4.  Pt will safely transfer from the floor to standing with Mod A or less using proper body mechanics Baseline:  Goal status: INITIAL   ASSESSMENT:  CLINICAL IMPRESSION:  Emphasis of skilled PT session on assessing LTGs. This date, 2/5 LTGs met, 2/5 LTGs not met, and 1/5 LTGs in progress. Pt  met goals for bed mobility at a modified independent level as well as Berg score improvement to 35/56, but this score remains at high fall risk. Pt did not meet  goals for gait speed or , with no improvement or regression seen. Pt is making progress on an HEP and will benefit from continued additions as appropriate and safe. Continue POC.   OBJECTIVE IMPAIRMENTS: Abnormal gait, decreased activity tolerance, decreased balance, decreased coordination, decreased mobility, difficulty walking, decreased strength, impaired tone, impaired UE functional use, improper body mechanics, and pain.   ACTIVITY LIMITATIONS: carrying, lifting, standing, squatting, stairs, transfers, bed mobility, bathing, toileting, dressing, self feeding, reach over head, hygiene/grooming, locomotion level, and caring for others  PARTICIPATION LIMITATIONS: meal prep, cleaning, laundry, medication management, personal finances, interpersonal relationship, driving, shopping, community activity, and yard work  PERSONAL FACTORS: Fitness, Past/current experiences, Transportation, and 1 comorbidity: CVA  are also affecting patient's functional outcome.   REHAB POTENTIAL: Good  CLINICAL DECISION MAKING: Evolving/moderate complexity  EVALUATION COMPLEXITY: Moderate  PLAN:  PT FREQUENCY: 2x/week  PT DURATION: 8 weeks (POC written for 10 weeks due to delay in scheduling)   PLANNED INTERVENTIONS: Therapeutic exercises, Therapeutic activity, Neuromuscular re-education, Balance training, Gait  training, Patient/Family education, Self Care, Joint mobilization, Stair training, Orthotic/Fit training, DME instructions, Aquatic Therapy, Dry Needling, Electrical stimulation, Manual therapy, and Re-evaluation  PLAN FOR NEXT SESSION:  Will need +2 if doing tall kneel activities. Prone- decrease assist amount? continue with Bioness to R HS and ant tib; RLE NMR, weight shift to R side, work on bed mobility and donning/doffing RUE from hand orthotic and donning/doffing AFO more independently; can trial hemiwalker ambulation- sling for RUE?, stepping activities at parallel/ballet bars, pt wants to do stair activities (step ups/taps?)   Beverely Low, [facility] [address] [place], [place]  [address] Phone:  [phone] Fax:  [phone]  12/01/2022, 3:24 PM

## 2022-12-01 NOTE — Therapy (Unsigned)
OUTPATIENT OCCUPATIONAL THERAPY NEURO TREATMENT  Patient Name: Jimmy Valentine MRN: [id number] DOB:22-Mar-1950, 72 y.o., male Today's Date: 12/01/2022  PCP: Wanda Plump, MD REFERRING PROVIDER: Wanda Plump, MD  END OF SESSION:  OT End of Session - 12/01/22 1100     Visit Number 12    Number of Visits 16   + evaluaiton   Date for OT Re-Evaluation 12/11/22    Authorization Type [employer] MC 2024 Met VL: [place] Auth Not Reqd    OT Start Time 1104    OT Stop Time 1145    OT Time Calculation (min) 41 min    Activity Tolerance Patient tolerated treatment well    Behavior During Therapy WFL for tasks assessed/performed             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,had a MRI, was prescribed testosterone by endocrinology   Sleep apnea    does't use cpap   Sudden hearing loss, left 2017   idiopathic sensorineural hearing loss   Past Surgical History:  Procedure Laterality Date   TOTAL KNEE ARTHROPLASTY Right 2005   TOTAL KNEE ARTHROPLASTY  12/04/2011   LEFT-- TOTAL KNEE ARTHROPLASTY;  Surgeon: Eugenia Mcalpine, MD;  Location: [facility];  Service: Orthopedics;  Laterality: Left;   Patient Active Problem List   Diagnosis Date Noted   Hemiparesis affecting right side as late effect of cerebrovascular accident (CVA) (HCC) 09/04/2022   Gait disturbance, post-stroke 09/04/2022   Hyponatremia 07/18/2022   Aphasia due to acute cerebrovascular accident (CVA) (HCC) 07/15/2022   Acute ischemic left MCA stroke (HCC) 06/24/2022   Stroke (cerebrum) (HCC) 06/21/2022   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 MRI showed large MCA territory basal ganglia infarct with petechial hemorrhage with no hemorrhagic transformation or mass effect.   Inpatient Rehab: 06/24/2022 - 07/30/2022 Patient discharged at [facility] Assist level.  Patient's care partners able to provide the necessary physical assistance at discharge.   Recommendation:  Patient will benefit from ongoing skilled OT services in home health setting to continue to advance functional skills in the area of BADL, iADL, and Reduce  care partner burden. Equipment: Shower chair, commode, RW, R RW hand splint, R resting hand splint, LH sponge, Dycem   Home Health therapies 07/24 - 08/24     PRECAUTIONS: Fall  WEIGHT BEARING RESTRICTIONS: No  PAIN:  Are you having pain? No   FALLS: Has patient fallen in last 6 months? Yes. Number of falls 1x - fell on his bottom about a month ago - got the walker stuck on the doorframe  LIVING  ENVIRONMENT: Lives with: lives alone and Caregiver 3-4 days/week x 12 hours, daughter, son and brother-in-law assist on other days Lives in: House/apartment Stairs: Yes: External: 3-5 steps; can reach both Has following equipment at home: Environmental consultant - 2 wheeled, Wheelchair (manual), Shower bench, bed side commode, Grab bars, and urinal  PLOF: Independent - still driving, played golf, retired Immunologist with lots traveling (driving)  PATIENT GOALS: To get my right side back working again  OBJECTIVE:   HAND DOMINANCE: Right (affected side)  ADLs: Overall ADLs: Min to mod assistance Transfers/ambulation related to ADLs: Supervision Eating: Gets help to cut food as needed, having to use L hand Grooming: Used regular razor yesterday for the first time UB Dressing: Needs help to get R arm in sleeve LB Dressing: Can pull pants up most of the time Toileting: Most of the time he is able to perform on his own; uses urinal at bedside at night Bathing: "50/50" with caregiver Tub Shower transfers: Gets help to get his leg into the tub Equipment: Transfer tub bench, Grab bars, bed side commode, and Long handled sponge  IADLs: Shopping: caregiver and family (daughter) Light housekeeping: Caregiver Meal Prep: Psychologist, counselling mobility: RW with hand orthosis & dycem + strap for R hand Medication management: Assistance from caregivers Financial management: Assistance from family Handwriting: unable with R UE; not assessed with L UE  MOBILITY STATUS: Needs Assist: Help to get his R UE/hand on the handle of his walker and Hx of falls  POSTURE COMMENTS:  rounded shoulders and forward head Sitting balance:  WFL in arm chair at table  ACTIVITY TOLERANCE: Activity tolerance: Limited due to R hemiplegia  FUNCTIONAL OUTCOME MEASURES: PSFS - 1.0  UPPER EXTREMITY ROM:    Active ROM Right eval Left eval  Shoulder flexion  [facility]  Shoulder abduction    Shoulder adduction    Shoulder  extension min   Shoulder internal rotation    Shoulder external rotation    Elbow flexion min   Elbow extension    Wrist flexion min   Wrist extension    Wrist ulnar deviation    Wrist radial deviation    Wrist pronation    Wrist supination    (Blank rows = not tested)  UPPER EXTREMITY MMT:     MMT Right eval Left eval  Shoulder flexion  4/5  Shoulder abduction    Shoulder adduction    Shoulder extension    Shoulder internal rotation    Shoulder external rotation    Middle trapezius    Lower trapezius    Elbow flexion    Elbow extension    Wrist flexion    Wrist extension    Wrist ulnar deviation    Wrist radial deviation    Wrist pronation    Wrist supination    (Blank rows = not tested)  HAND FUNCTION: Grip strength: Right: 3.9, 3.7, 5.2 lbs; Left: 34.8, 36.5, 37.9 lbs Average: Right 4.3 lbs Left 36.4 lbs  COORDINATION: Unable to perform R UE coordination tasks,  SENSATION: WFL  EDEMA: Slight RUE  MUSCLE TONE: RUE: Hypotonic and Flaccid  COGNITION: Overall cognitive status: Within functional limits for tasks assessed and h/o more severe aphasia and does self-report need to think about what he is trying to say etc  VISION: Subjective report: Eye Dr ~ 3 weeks Baseline vision: Wears glasses for reading only Visual history: cataracts  VISION ASSESSMENT: TBA  Patient is not having difficulty with activities due to vision.  PERCEPTION: Not tested  PRAXIS: Not tested  OBSERVATIONS: Pt ambulated into clinic w/RW and R hand orthosis. Patient was wearing his AFO which he stated he stopped wearing after he finished home health therapy.   TODAY'S TREATMENT:                                                                                                                               Neuro Re-Ed *** table stretch Con[place] Drawer With dycem reading  PATIENT EDUCATION: Education details: Continued RUE HEP ideas Person educated: Patient Education method:  Explanation, Demonstration, Tactile cues, and Verbal cues Education comprehension: verbalized understanding, returned demonstration, verbal cues required, tactile cues required, and needs further education  HOME EXERCISE PROGRAM:  10/20/22 - UE ROM (grip, forearm, elbow flex, shoulder shrugs) - no images provided only written suggestions. 10/27/2022: updated ROM HEP Access Code [email or web address] 11/03/22 - sleep positioning handout provided  GOALS: Goals reviewed with patient? Yes  SHORT TERM GOALS: Target date: 11/13/22  Patient will demonstrate updated RUE HEP with 25% verbal cues or less for proper execution.  Baseline: New to outpatient OT.  Self reported difficulty with motivation at home Goal status: IN progress 11/19/22 - Pt reported completing HEP. Pt demo'd understanding of exercises with approx. 40% v/c.  2.  Patient will demonstrate at least 8+ lbs RUE grip strength as needed to stabilize container on tabletop/hold large handled object.  Baseline: Right 4.3 lbs Goal status: IN progress 11/19/22 - Right: 5 lbs, with support from OT and pt's LUE for initial grasp positioning of RUE  3.  Patient will be able to don T-shirt without physical assistance x 5 days in a row. Baseline: Caregiver assistance to get R UE in sleeve Goal status: IN Progress 11/19/22 - Pt dons t-shirt on LUE and head, then caregiver provides assistance for RUE in sleeve.  4.  Patient will improve R UE AROM for shoulder elevation and elbow flexion to lift arm/hand to walker.  Baseline: Caregiver assist or uses LUE.  Trace elbow/shoulder movement Goal status: IN Progress 11/19/22 - Pt reports "sometimes" placing RUE on walker without LUE assistance though positioning requires full body compensatory movements. Pt primarily continues to use LUE.  5.  Patient will be assisted to don/doff appropriate splints/positioning devices to minimize subluxation and minimize risk of RUE/hand contracture. Baseline: None place Goal  status: IN Progress 11/19/22 - Pt reports wearing splint at least 2-3 hours per day, not currently wearing at night d/t discomfort.    LONG  TERM GOALS: Target date: 12/11/22  Patient will demonstrate updated RUE HEP with visual schedule and images for proper execution and frequency. Baseline: New to outpatient OT.  Self reported difficulty with motivation at home. Goal status: IN Progress  2.  Patient will demonstrate at least 10 lbs RUE grip strength as needed to hold objects for manipulation by LUE. Baseline: Right 4.3 lbs Goal status: IN Progress  3.  Patient will be able to get to EOB with no physical assistance to toilet with BSC as needed. Baseline: Assistance to get to EOB from caregivers. Goal status: IN Progress 11/19/22 - Pt continues to use urinal in bed. Pt reports improved ability to ind transition to EOB and standing.   4.  Patient will be able to prepare simple food items with appropriate AE (small crock pot, microwave, toaster oven etc) Baseline: Caregiver/family perform meal prep  Goal status: IN Progress 11/12/22 - showed pt walker tray for home use  5.  Patient will report at least two-point increase in average PSFS score or at least three-point increase in a single activity score indicating functionally significant improvement given minimum detectable change. Baseline: 1.0 total score (See above for individual activity scores)  Goal status: IN Progress   ASSESSMENT:  CLINICAL IMPRESSION: Pt demonstrating ability to incorporate RUE into functional tasks with repetition and education. Lacks volitional incorporation or use of RUE as needed to progress towards goals.   PERFORMANCE DEFICITS: in functional skills including ADLs, coordination, dexterity, proprioception, sensation, tone, ROM, strength, flexibility, Fine motor control, Gross motor control, mobility, balance, endurance, decreased knowledge of use of DME, skin integrity, and UE functional use, cognitive  skills including energy/drive, safety awareness, and sequencing, and psychosocial skills including coping strategies, environmental adaptation, and routines and behaviors.   IMPAIRMENTS: are limiting patient from ADLs, IADLs, rest and sleep, leisure, and social participation.   CO-MORBIDITIES: may have co-morbidities  that affects occupational performance. Patient will benefit from skilled OT to address above impairments and improve overall function.  REHAB POTENTIAL: Good   PLAN:  OT FREQUENCY: 1-2x/week  OT DURATION: 8 weeks  PLANNED INTERVENTIONS: self care/ADL training, therapeutic exercise, therapeutic activity, neuromuscular re-education, manual therapy, passive range of motion, balance training, functional mobility training, aquatic therapy, splinting, electrical stimulation, patient/family education, cognitive remediation/compensation, visual/perceptual remediation/compensation, energy conservation, coping strategies training, and DME and/or AE instructions  RECOMMENDED OTHER SERVICES: PT evaluation already completed.  CONSULTED AND AGREED WITH PLAN OF CARE: Patient and family member/caregiver  PLAN FOR NEXT SESSION: EXTEND vs DC  Continue E-stim protocols and NRE for R UE AAROM  Supportive RUE treatment options Shoulder stability/weightbearing  ADL dressing trials and IADLS (cooking) etc in standing PRN splinting checks   Delana Meyer, OT 12/01/2022, 4:18 PM

## 2022-12-02 NOTE — Addendum Note (Signed)
Addended by: Peter Congo on: 12/02/2022 09:38 AM   Modules accepted: Orders

## 2022-12-03 ENCOUNTER — Ambulatory Visit: Payer: Medicare HMO | Admitting: Occupational Therapy

## 2022-12-03 ENCOUNTER — Ambulatory Visit: Payer: Medicare HMO | Admitting: Physical Therapy

## 2022-12-03 VITALS — BP 136/76 | HR 88

## 2022-12-03 DIAGNOSIS — S43111D Subluxation of right acromioclavicular joint, subsequent encounter: Secondary | ICD-10-CM

## 2022-12-03 DIAGNOSIS — R278 Other lack of coordination: Secondary | ICD-10-CM

## 2022-12-03 DIAGNOSIS — M6281 Muscle weakness (generalized): Secondary | ICD-10-CM

## 2022-12-03 DIAGNOSIS — I69351 Hemiplegia and hemiparesis following cerebral infarction affecting right dominant side: Secondary | ICD-10-CM

## 2022-12-03 DIAGNOSIS — R2681 Unsteadiness on feet: Secondary | ICD-10-CM

## 2022-12-03 DIAGNOSIS — R2689 Other abnormalities of gait and mobility: Secondary | ICD-10-CM

## 2022-12-03 NOTE — Therapy (Signed)
OUTPATIENT PHYSICAL THERAPY NEURO TREATMENT   Patient Name: Jimmy Valentine MRN: [id number] DOB:1950/09/13, 72 y.o., male Today's Date: 12/03/2022    PCP: Wanda Plump, MD REFERRING PROVIDER: Wanda Plump, MD    END OF SESSION:  PT End of Session - 12/03/22 1109     Visit Number 13    Number of Visits 25   recert   Date for PT Re-Evaluation 01/26/23   to allow for delays in scheduling   Authorization Type [employer]    PT Start Time 1103    PT Stop Time 1143    PT Time Calculation (min) 40 min    Equipment Utilized During Treatment Gait belt    Activity Tolerance Patient tolerated treatment well    Behavior During Therapy [facility] for tasks assessed/performed                      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,had a MRI, was prescribed testosterone by endocrinology   Sleep apnea    does't use cpap   Sudden hearing loss, left 2017   idiopathic sensorineural hearing loss   Past Surgical History:  Procedure Laterality Date   TOTAL KNEE ARTHROPLASTY Right 2005   TOTAL KNEE ARTHROPLASTY  12/04/2011   LEFT-- TOTAL KNEE ARTHROPLASTY;  Surgeon: Eugenia Mcalpine, MD;  Location: [facility];  Service: Orthopedics;  Laterality: Left;   Patient Active Problem List   Diagnosis Date Noted   Hemiparesis affecting right side as late effect of cerebrovascular accident (CVA) (HCC) 09/04/2022   Gait disturbance, post-stroke 09/04/2022   Hyponatremia 07/18/2022   Aphasia due to acute cerebrovascular accident (CVA) (HCC) 07/15/2022   Acute ischemic left MCA stroke (HCC) 06/24/2022   Stroke (cerebrum) (HCC) 06/21/2022   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 10/18/2014   Elevated LFTs 04/13/2014   Diabetes (HCC) 09/03/2011   Gout 09/03/2011   OSA (obstructive sleep apnea) 04/22/2011   Annual physical exam 12/04/2010   Hypertension    Hyperlipidemia    Hypogonadism male    Erectile dysfunction     ONSET DATE: 09/23/2022 (referral)   REFERRING DIAG: N02.725 (ICD-10-CM) - Hemiparesis affecting right side as late effect of cerebrovascular accident (CVA) (HCC)  THERAPY DIAG:  Hemiplegia and hemiparesis following cerebral infarction affecting right dominant side (HCC)  Other abnormalities of gait and mobility  Unsteadiness on feet  Muscle weakness (generalized)  Rationale for Evaluation and Treatment: Rehabilitation  SUBJECTIVE:  SUBJECTIVE STATEMENT:  Pt reports doing well, no new falls since last visit. Having some sciatica pain in his LLE  today but feels better since getting up out of bed.   Pt accompanied by:  Caregiver, [name] (in lobby)   PERTINENT HISTORY: L MCA CVA in May 2024   PAIN:  Are you having pain? No  PRECAUTIONS: Fall  WEIGHT BEARING RESTRICTIONS: No  FALLS: Has patient fallen in last 6 months? Yes. Number of falls 1  LIVING ENVIRONMENT: Lives with: lives alone Lives in: House/apartment Stairs: Yes: External: 3 steps; on right going up, on left going up, and can reach both Has following equipment at home: Walker - 2 wheeled, Wheelchair (manual), shower chair, bed side commode, and AFO  PLOF: Independent  PATIENT GOALS: "Get back to where I can walk by myself without the walker and without assistance"   OBJECTIVE:   DIAGNOSTIC FINDINGS: MRI of brain on 06/22/22  IMPRESSION: 1. Positive for Left MCA territory lacunar type infarct tracking from the left corona  radiata to the lentiform. Petechial hemorrhage (Heidelberg classification 1b: HI2). No malignant hemorrhagic transformation. No significant mass effect.   2. No other acute intracranial abnormality. 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  CT angio of head/neck on 06/21/22 IMPRESSION: 1. 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 The left MCA bi/trifurcation and left M2 branches remain patent.   2. Superimposed Severe intracranial and cervical ICA Atherosclerosis resulting in: - distal Right Vertebral Artery (V4) OCCLUSION. - distal Left Vertebral Artery Severe (V4) stenosis. - Right ICA origin RADIOGRAPHIC STRING SIGN stenosis due to calcified plaque. - Left ICA origin stenosis due to complex plaque estimated 65-70%. - Severe Left ICA supraclinoid stenosis due to calcified plaque.  COGNITION: Overall cognitive status: Within functional limits for tasks assessed   SENSATION: Pt denies numbness/tingling in RLE/RUE   COORDINATION: Heel to shin test: WNL on LLE, unable to perform hip flexion on RLE but able to achieve cross-legged position    MUSCLE TONE: Mild clonus in R foot that fatigues after 3s   POSTURE: rounded shoulders and forward head   LOWER EXTREMITY MMT:  Tested in seated position   MMT Right Eval Left Eval  Hip flexion 2- 4+  Hip extension    Hip abduction 1 4+  Hip adduction 2 4+  Hip internal rotation    Hip external rotation    Knee flexion 3 4+  Knee extension 2 4+  Ankle dorsiflexion 4- 4+  Ankle plantarflexion    Ankle inversion    Ankle eversion    (Blank rows = not tested)  BED MOBILITY:  Has a rail but requires assistance (up to max) to roll and perform sit <>supine   TRANSFERS: Assistive device utilized:  Environmental consultant - 2 wheeled  Sit to stand: SBA Stand to sit: SBA Pt kickstands RLE and leans to L side to perform    GAIT: Gait pattern: step to pattern, decreased step length- Right, decreased stride length, decreased hip/knee flexion- Right, decreased ankle dorsiflexion- Right, Left hip hike, lateral lean- Left, and poor foot clearance- Right Distance walked: Various clinic distances  Assistive device utilized: Walker - 2 wheeled Level of assistance: CGA and Min A Comments: Pt not wearing AFO this date. Noted pt dragging RLE on ground, especially w/fatigue, and would  lose balance anteriorly requiring min A to stabilize. Pt also vaults to progress RLE and frequently tips RW over on L side.    VITALS  Vitals:   12/03/22 1108  BP: 136/76  Pulse: 88     TODAY'S TREATMENT:       TherAct Assessed vitals (see above) and WNL for therapy   Gait Training  The following treadmill training was completed for aerobic/neural priming, endurance, increased step clearance and gait speed.  -Mod A to step up to TM w/RW  - Total of 5 minutes of walking w/bodyweight support harness (size XL) at 0.3 mph w/min A to facilitate knee flexion of RLE. Total of 3 person assist required to manage RLE, controls of treadmill and provide CGA for pt. Mod verbal cues to facilitate step-through pattern w/LLE, as pt taking very small steps w/LLE. With tactile cues to "step", pt able to facilitate symmetrical step length and cadence. Short standing rest break taken at 2:43.                                                                                                                PATIENT EDUCATION:  Education details: Continue HEP, adding appointments  Person educated: Patient Education method: Medical illustrator Education comprehension: verbalized understanding and returned demonstration  HOME EXERCISE PROGRAM:  Access Code: [email or web address] URL: [email or web address] Date: 11/12/2022 Prepared by:  Alethia Berthold Tomio Kirk  Exercises - Supine Figure 4 Piriformis Stretch  - 1 x daily - 7 x weekly - 3 sets - 1-3 minute hold - Seated Piriformis Stretch  - 1 x daily - 7 x weekly - 3 sets - 1-3 minute hold  GOALS: Goals reviewed with patient? Yes  SHORT TERM GOALS: Target date: 10/28/2022    Pt will perform initial HEP w/min A from caregiver for improved strength, balance, transfers and gait.  Baseline: to be established  Goal status: IN PROGRESS  2.  Pt will improve gait velocity to at least 0.7 ft/s w/RW and CGA for improved gait efficiency and independence   Baseline: 0.49 ft/s w/RW and min A; 0.66 ft/s w/RW and SBA Goal status: PARTIALLY MET   3.  Pt will be able to don and doff R hand orthotic on RW w/CGA for improved independence and weight shift to R side  Baseline: max A; min A; SBA (10/3)  Goal status: MET   4.  Pt will ambulate greater than or equal to 300 feet on with LRAD and CGA for improved cardiovascular endurance and BLE strength.  Baseline: 213 ft with RW and min A (9/17); 195' w/RW and SBA  Goal status: PARTIALLY MET   5.  Berg to be assessed and LTG written  Baseline: 25/56 Goal status: MET  6.  Pt will perform supine > sit EOB w/min A or use of rail for improved independence and functional strength Baseline: mod-max A; min A (9/26) Goal status: MET  LONG TERM GOALS: Target date: 11/25/2022    Pt will be independent with final HEP for improved strength, balance,  transfers and gait.  Baseline: "going alright" Goal status: In Progress  2.  Pt will improve gait velocity to at least 1.0 ft/s w/LRAD and SBA for improved gait efficiency and independence   Baseline: 0.49 ft/s w/RW and min A; 0.66 ft/s w/RW and SBA, 0.60 ft/s w/ RW and close SBA (12/01/22) Goal status: NOT MET  3.  Pt will ambulate greater than or equal to 400 feet on with LRAD and CGA for improved cardiovascular endurance and BLE strength.   Baseline: 213 ft with RW and min A (9/17),  195' w/RW and SBA, 208 with RW and CGA (12/01/22) Goal status: NOT MET  4.  Pt will improve Berg score to 34/56 for decreased fall risk  Baseline: 25/56, 35/56 (12/01/22) Goal status: MET  5.  Pt will perform bed mobility at CGA or SBA level for improved independence at home  Baseline: mod-max A, Mod I Goal status: MET    RECERTIFICATION SHORT TERM GOALS=  12/22/2022   1.  Pt will improve gait velocity to at least 0.75 ft/s w/ LRAD and SBA for improved gait efficiency and independence. Baseline: 0.60 ft/s w/ RW and close SBA (12/01/22) Goal status: INITIAL   RECERTIFICATION LONG TERM GOALS:  Target date: 01/12/2023   Pt will improve gait velocity to at least 1.0 ft/s w/ LRAD and SBA for improved gait efficiency and independence. Baseline: 0.60 ft/s w/ RW and close SBA (12/01/22) Goal status: INITIAL  2.  Pt will improve Berg score to 39/56 for decreased fall risk Baseline: 35/56 (12/01/22) Goal status: INITIAL  3.  Pt will ambulate greater than or equal to 400 feet on with LRAD and CGA for improved cardiovascular endurance and BLE strength.  Baseline: 208 FT with RW and CGA (12/01/22) Goal status: INITIAL  4.  Pt will safely transfer from the floor to standing with Mod A or less using proper body mechanics Baseline:  Goal status: INITIAL   ASSESSMENT:  CLINICAL IMPRESSION:  Emphasis of skilled PT session on gait training on treadmill for improved knee/hip flexion of RLE, step clearance and endurance. Pt initially required mod-max cues to facilitate step-through pattern w/LLE and to find symmetrical cadence. Pt able to actively facilitate knee flexion of RLE ~50% of time on treadmill. Continue POC.   OBJECTIVE IMPAIRMENTS: Abnormal gait, decreased activity tolerance, decreased balance, decreased coordination, decreased mobility, difficulty walking, decreased strength, impaired tone, impaired UE functional use, improper body mechanics, and pain.   ACTIVITY  LIMITATIONS: carrying, lifting, standing, squatting, stairs, transfers, bed mobility, bathing, toileting, dressing, self feeding, reach over head, hygiene/grooming, locomotion level, and caring for others  PARTICIPATION LIMITATIONS: meal prep, cleaning, laundry, medication management, personal finances, interpersonal relationship, driving, shopping, community activity, and yard work  PERSONAL FACTORS: Fitness, Past/current experiences, Transportation, and 1 comorbidity: CVA  are also affecting patient's functional outcome.   REHAB POTENTIAL: Good  CLINICAL DECISION MAKING: Evolving/moderate complexity  EVALUATION COMPLEXITY: Moderate  PLAN:  PT FREQUENCY: 2x/week  PT DURATION: 8 weeks (POC written for 10 weeks due to delay in scheduling) + 6 weeks (recert)  PLANNED INTERVENTIONS: Therapeutic exercises, Therapeutic activity, Neuromuscular re-education, Balance training, Gait training, Patient/Family education, Self Care, Joint mobilization, Stair training, Orthotic/Fit training, DME instructions, Aquatic Therapy, Dry Needling, Electrical stimulation, Manual therapy, and Re-evaluation  PLAN FOR NEXT SESSION: Continue TM training? Will need +2 if doing tall kneel activities. Prone- decrease assist amount? continue with Bioness to R HS and ant tib; RLE NMR, weight shift to R side, work on  bed mobility and donning/doffing RUE from hand orthotic and donning/doffing AFO more independently; can trial hemiwalker ambulation- sling for RUE?, stepping activities at parallel/ballet bars, pt wants to do stair activities (step ups/taps?), needs to schedule 2x/week for 6 more weeks PT and OT   Jill Alexanders Bless Lisenby, PT, DPT [facility] [address] [place], [place]  [address] Phone:  [phone] Fax:  [phone]  12/03/2022, 12:11 PM

## 2022-12-03 NOTE — Therapy (Signed)
OUTPATIENT OCCUPATIONAL THERAPY NEURO TREATMENT  Patient Name: Jimmy Valentine MRN: [id number] DOB:1951-01-29, 72 y.o., male Today's Date: 12/03/2022  PCP: Wanda Plump, MD REFERRING PROVIDER: Wanda Plump, MD  END OF SESSION:  OT End of Session - 12/03/22 1009     Visit Number 13    Number of Visits 16   + evaluaiton   Date for OT Re-Evaluation 12/11/22    Authorization Type [employer] MC 2024 Met VL: [place] Auth Not Reqd    OT Start Time 1015    OT Stop Time 1100    OT Time Calculation (min) 45 min    Equipment Utilized During Treatment arm trough    Activity Tolerance Patient tolerated treatment well    Behavior During Therapy WFL for tasks assessed/performed             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,had a MRI, was prescribed testosterone by endocrinology   Sleep apnea    does't use cpap   Sudden hearing loss, left 2017   idiopathic sensorineural hearing loss   Past Surgical History:  Procedure Laterality Date   TOTAL KNEE ARTHROPLASTY Right 2005   TOTAL KNEE ARTHROPLASTY  12/04/2011   LEFT-- TOTAL KNEE ARTHROPLASTY;  Surgeon: Eugenia Mcalpine, MD;  Location: [facility];  Service: Orthopedics;  Laterality: Left;   Patient Active Problem List   Diagnosis Date Noted   Hemiparesis affecting right side as late effect of cerebrovascular accident (CVA) (HCC) 09/04/2022   Gait disturbance, post-stroke 09/04/2022   Hyponatremia 07/18/2022   Aphasia due to acute cerebrovascular accident (CVA) (HCC) 07/15/2022   Acute ischemic left MCA stroke (HCC) 06/24/2022   Stroke (cerebrum) (HCC) 06/21/2022   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  He had been having spasms that day and missed therapy in the morning and then had the fall in the afternoon.   Pt accompanied by: self  PERTINENT HISTORY:  Presented to [facility] ED on 06/21/2022 with sudden onset of right-sided weakness, dysarthria and facial drooping. MRI showed large MCA territory basal ganglia infarct with petechial hemorrhage with no hemorrhagic transformation or mass effect.   Inpatient Rehab: 06/24/2022 - 07/30/2022 Patient discharged at [facility] Assist level.  Patient's care partners able to provide the necessary physical assistance at discharge.   Recommendation:  Patient will benefit from ongoing skilled OT services in home health setting to continue to advance functional skills in the area of BADL, iADL, and Reduce care partner burden. Equipment: Shower chair, commode, RW, R RW hand splint, R resting hand splint, LH sponge, Dycem   Home Health therapies 07/24 - 08/24     PRECAUTIONS: Fall  WEIGHT BEARING  RESTRICTIONS: No  PAIN:  Are you having pain? No   FALLS: Has patient fallen in last 6 months? Yes. Number of falls 1x - fell on his bottom about a month prior to evaluation - got the walker stuck on the doorframe  11/26/22 - had spasms in the AM and fell in PM trying to sit down but fell forwards instead  LIVING ENVIRONMENT: Lives with: lives alone and Caregiver 3-4 days/week x 12 hours, daughter, son and brother-in-law assist on other days Lives in: House/apartment Stairs: Yes: External: 3-5 steps; can reach both Has following equipment at home: Environmental consultant - 2 wheeled, Wheelchair (manual), Shower bench, bed side commode, Grab bars, and urinal  PLOF: Independent - still driving, played golf, retired Immunologist with lots traveling (driving)  PATIENT GOALS: To get my right side back working again  OBJECTIVE:   HAND DOMINANCE: Right (affected side)  ADLs: Overall ADLs: Min to mod assistance Transfers/ambulation related to ADLs: Supervision Eating: Gets help to cut food as needed, having to use L hand Grooming: Used regular razor yesterday for the first time UB Dressing: Needs help to get R arm in sleeve LB Dressing: Can pull pants up most of the time Toileting: Most of the time he is able to perform on his own; uses urinal at bedside at night Bathing: "50/50" with caregiver Tub Shower transfers: Gets help to get his leg into the tub Equipment: Transfer tub bench, Grab bars, bed side commode, and Long handled sponge  IADLs: Shopping: caregiver and family (daughter) Light housekeeping: Caregiver Meal Prep: Psychologist, counselling mobility: RW with hand orthosis & dycem + strap for R hand Medication management: Assistance from caregivers Financial management: Assistance from family Handwriting: unable with R UE; not assessed with L UE  MOBILITY STATUS: Needs Assist: Help to get his R UE/hand on the handle of his walker and Hx of falls  POSTURE COMMENTS:  rounded shoulders  and forward head Sitting balance:  WFL in arm chair at table  ACTIVITY TOLERANCE: Activity tolerance: Limited due to R hemiplegia  FUNCTIONAL OUTCOME MEASURES: PSFS - 1.0  12/03/22 PSFS = 4.7  UPPER EXTREMITY ROM:    Active ROM Right eval Left eval  Shoulder flexion  [facility]  Shoulder abduction    Shoulder adduction    Shoulder extension min   Shoulder internal rotation    Shoulder external rotation    Elbow flexion min   Elbow extension    Wrist flexion min   Wrist extension    Wrist ulnar deviation    Wrist radial deviation    Wrist pronation    Wrist supination    (Blank rows = not tested)  UPPER EXTREMITY MMT:     MMT Right eval Left eval  Shoulder flexion  4/5  Shoulder abduction    Shoulder adduction    Shoulder extension    Shoulder internal rotation    Shoulder external rotation    Middle trapezius    Lower trapezius    Elbow flexion    Elbow extension  Wrist flexion    Wrist extension    Wrist ulnar deviation    Wrist radial deviation    Wrist pronation    Wrist supination    (Blank rows = not tested)  HAND FUNCTION: Grip strength: Right: 3.9, 3.7, 5.2 lbs; Left: 34.8, 36.5, 37.9 lbs Average: Right 4.3 lbs Left 36.4 lbs  12/03/22: Right 6.1, 6.6, 4.8  Left 38.3, 39.4, 38.8 Average: Right 5.8 lbs Left 38.8 lbs  COORDINATION: Unable to perform R UE coordination tasks,   SENSATION: WFL  EDEMA: Slight RUE  MUSCLE TONE: RUE: Hypotonic and Flaccid  COGNITION: Overall cognitive status: Within functional limits for tasks assessed and h/o more severe aphasia and does self-report need to think about what he is trying to say etc  VISION: Subjective report: Eye Dr ~ 3 weeks Baseline vision: Wears glasses for reading only Visual history: cataracts  VISION ASSESSMENT: TBA  Patient is not having difficulty with activities due to vision.  PERCEPTION: Not tested  PRAXIS: Not tested  OBSERVATIONS: Pt ambulated into clinic w/RW and R hand  orthosis. Patient was wearing his AFO which he stated he stopped wearing after he finished home health therapy.   TODAY'S TREATMENT:                                                                                                                               Therapeutic activities: Reviewed OT POC, goals and updated progress.  He has improved ease with transition to EOB and is without caregivers at night now.  He has tried to do stuff in the kitchen but reports that he couldn't cut is banana for cereal for breakfast.  Minor improvements in B grip strengths noted as recorded in goal section below.  Neuromuscular Reed: Neuromuscular reeducation provided, including specific visual, tactile and verbal cues and physical assistance to stimulate the neuromuscular system and promote functional ROM, movement and use of RUE for HEP exercises per prior printed pt instructions for finger stretches, wrist flexion/extension, forearm pronation/supination, elbow flexion/extension and shoulder flexion.  PATIENT EDUCATION: Education details: RUE HEP and continued OT POC Person educated: Patient Education method: Explanation, Demonstration, Tactile cues, and Verbal cues Education comprehension: verbalized understanding, returned demonstration, verbal cues required, tactile cues required, and needs further education  HOME EXERCISE PROGRAM:  10/20/22 - UE ROM (grip, forearm, elbow flex, shoulder shrugs) - no images provided only written suggestions. 10/27/2022: updated ROM HEP Access Code [email or web address] 11/03/22 - sleep positioning handout provided  GOALS: Goals reviewed with patient? Yes  SHORT TERM GOALS: Target date: 12/31/22  Patient will demonstrate updated RUE HEP with 25% verbal cues or less for proper execution.  Baseline: New to outpatient OT.  Self reported difficulty with motivation at home Goal status: MET 11/19/22 - Pt reported completing HEP. Pt demo'd understanding of exercises with approx. 40%  v/c. 12/03/22 - splint 2x/day, stretching hand/wrist, shoulder elevations, squeezing ball   2.  Patient will demonstrate at least 8+  lbs RUE grip strength as needed to stabilize container on tabletop/hold large handled object.  Baseline: Right 4.3 lbs Goal status: IN progress 11/19/22 - Right: 5 lbs, with support from OT and pt's LUE for initial grasp positioning of RUE 12/03/22 - Average: Right 5.8 lbs Left 38.8 lbs  3.  Patient will be able to don T-shirt without physical assistance x 5 days in a row. Baseline: Caregiver assistance to get R UE in sleeve Goal status: IN Progress 11/19/22 - Pt dons t-shirt on LUE and head, then caregiver provides assistance for RUE in sleeve.  4.  Patient will improve R UE AROM for shoulder elevation and elbow flexion to lift arm/hand to walker.  Baseline: Caregiver assist or uses LUE.  Trace elbow/shoulder movement Goal status: IN Progress 11/19/22 - Pt reports "sometimes" placing RUE on walker without LUE assistance though positioning requires full body compensatory movements. Pt primarily continues to use LUE.  5.  Patient will be assisted to don/doff appropriate splints/positioning devices to minimize subluxation and minimize risk of RUE/hand contracture. Baseline: None place Goal status: MET  11/19/22 - Pt reports wearing splint at least 2-3 hours per day, not currently wearing at night d/t discomfort.    LONG TERM GOALS: Target date: 01/28/23  Patient will demonstrate updated RUE HEP with visual schedule and images for proper execution and frequency. Baseline: New to outpatient OT.  Self reported difficulty with motivation at home. Goal status: IN Progress  2.  Patient will demonstrate at least 10 lbs RUE grip strength as needed to hold objects for manipulation by LUE. Baseline: Right 4.3 lbs Goal status: IN Progress 12/03/22 - Average: Right 5.8 lbs Left 38.8 lbs  3.  Patient will be able to get to EOB with no physical assistance to toilet  with BSC as needed. Baseline: Assistance to get to EOB from caregivers. Goal status: MET 11/19/22 - Pt continues to use urinal in bed. Pt reports improved ability to ind transition to EOB and standing.   4.  Patient will be able to prepare simple food items with appropriate AE (small crock pot, microwave, toaster oven etc) Baseline: Caregiver/family perform meal prep  Goal status: IN Progress 11/12/22 - showed pt walker tray for home use  5.  Patient will report at least two-point increase in average PSFS score or at least three-point increase in a single activity score indicating functionally significant improvement given minimum detectable change. Baseline: 1.0 total score (See above for individual activity scores)  Goal status: MET and updated to continue to work towards additional 3 pt improvement s/p prog note 12/03/22 12/03/22 PSFS = 4.7   ASSESSMENT:  CLINICAL IMPRESSION: Patient is a 72 y.o. male who was seen today for occupational therapy treatment and progress report s/p CVA 09/04/22 with R hemiplegia.  Patient currently presents below baseline level of function with ADLs and IADLS but no longer needs 24/7 assistance from caregivers and is alone at home at night.  R hemiplegia remains but he is able to lift arm up towards RW handle and demonstrates ability to incorporate RUE into functional neuro reed tasks during therapy with repetition and education. Pt would benefit from skilled OT services in the outpatient setting to work on impairments as noted below to help pt return to PLOF as able.    This progress note is for dates: 10/15/22 to 12/03/2022. Pt has met 2/5 STGs and 2/5 LTGs. Pt making progress towards goals as expected and continues to benefit from skilled OT services in the  outpatient setting to work towards remaining goals or until max rehab potential is met.    PERFORMANCE DEFICITS: in functional skills including ADLs, coordination, dexterity, proprioception, sensation,  tone, ROM, strength, flexibility, Fine motor control, Gross motor control, mobility, balance, endurance, decreased knowledge of use of DME, skin integrity, and UE functional use, cognitive skills including energy/drive, safety awareness, and sequencing, and psychosocial skills including coping strategies, environmental adaptation, and routines and behaviors.   IMPAIRMENTS: are limiting patient from ADLs, IADLs, rest and sleep, leisure, and social participation.   CO-MORBIDITIES: may have co-morbidities  that affects occupational performance. Patient will benefit from skilled OT to address above impairments and improve overall function.  REHAB POTENTIAL: Good   PLAN:  OT FREQUENCY: 1-2x/week  OT DURATION: additional 8 weeks  PLANNED INTERVENTIONS: self care/ADL training, therapeutic exercise, therapeutic activity, neuromuscular re-education, manual therapy, passive range of motion, balance training, functional mobility training, aquatic therapy, splinting, electrical stimulation, patient/family education, cognitive remediation/compensation, visual/perceptual remediation/compensation, energy conservation, coping strategies training, and DME and/or AE instructions  RECOMMENDED OTHER SERVICES: PT treatment in place  CONSULTED AND AGREED WITH PLAN OF CARE: Patient and family member/caregiver  PLAN FOR NEXT SESSION:   ADLS/IADLS - pt to bring extra shirt; work in Surveyor, mining Continue E-stim protocols and NRE for R UE AAROM  Review and progress RUE treatment options Shoulder stability/weightbearing PRN splinting checks   Victorino Sparrow, OT 12/03/2022, 3:23 PM

## 2022-12-04 ENCOUNTER — Encounter: Payer: Medicare HMO | Attending: Physical Medicine & Rehabilitation | Admitting: Physical Medicine & Rehabilitation

## 2022-12-04 ENCOUNTER — Encounter: Payer: Self-pay | Admitting: Physical Medicine & Rehabilitation

## 2022-12-04 VITALS — BP 132/78 | HR 74 | Ht 69.0 in | Wt 198.0 lb

## 2022-12-04 DIAGNOSIS — I69351 Hemiplegia and hemiparesis following cerebral infarction affecting right dominant side: Secondary | ICD-10-CM | POA: Diagnosis not present

## 2022-12-04 NOTE — Progress Notes (Signed)
Subjective:    Patient ID: Jimmy Valentine, male    DOB: [birth year], 72 y.o.   MRN: [id number] 72 y.o. male who presented to [facility] ED on 06/21/2022 with sudden onset of right-sided weakness, dysarthria and facial drooping. Code stroke initiated. CT head showed questionable hyperdense left MCA m1 and a chronic lacunar infarct of the left corona radiata/basal ganglia. He was administered tenecteplase. 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 Carotid duplex showed bilateral stenosis 1-39%. MRI showed large MCA territory basal ganglia infarct with petechial hemorrhage with no hemorrhagic transformation or mass effect. Echo showed EF 65-70%, trivial MVR. Hgb A1c is 6.7%. LDL 91. Started for DAPT x 3 months for intracranial atherosclerosis, then aspirin monotherapy, change statin to atorvastatin and continue at discharge.    On 5/19, patient's bedside RN noted worsening right-sided weakness from her initial assessment (initially slight pronator drift, now minimal antigravity strength noted) and neurology was notified while on the unit at 08:30 AM. Assessed by Dr. Viviann Spare.   Patient's right-sided weakness noted to be fluctuating but patient was still weaker on the right than bedside RN's initial assessment.  Stat CT head order placed and did not show new acute changes.   Admit date: 06/24/2022 Discharge date: 07/30/2022 HPI  The patient completed his inpatient rehabilitation and is living with family assistance and attending outpatient therapy at neuro rehab. OP PT, OT Patient has treatment scheduled through the beginning of January.  Spasms one night kept him up  2 falls since d/c from hosp but no injury, states that he did not trip on his foot.  Mod I bathing except tub transfers needs help with dressing  Pain Inventory Average Pain 0 Pain Right Now 0   In the last 24 hours, has pain interfered with the following? General activity  0 Relation with others 0 Enjoyment of life 0 What TIME of day is your pain at its worst? evening Sleep (in general) Good  Pain is worse with: some activites Pain improves with: rest Relief from Meds: 7  Family History  Problem Relation Age of Onset   Hypertension Mother    Coronary artery disease Mother        M CABG at age 24s   Alzheimer's disease Mother    Diabetes Sister    Colon cancer Neg Hx    Prostate cancer Neg Hx    Stomach cancer Neg Hx    Rectal cancer Neg Hx    Social History   Socioeconomic History   Marital status: Widowed    Spouse name: married   Number of children: 0   Years of education: Not on file   Highest education level: Not on file  Occupational History   Occupation: fully retired-sales rep  Tobacco Use   Smoking status: Former    Current packs/day: 0.00    Average packs/day: 1 pack/day for 15.0 years (15.0 ttl pk-yrs)    Types: Cigarettes    Start date: 12/03/1980    Quit date: 12/04/1995    Years since quitting: 27.0   Smokeless tobacco: Never   Tobacco comments:    occ cigars   Substance and Sexual Activity   Alcohol use: Yes    Alcohol/week: 12.0 standard drinks of alcohol    Types: 12 Standard drinks or equivalent per week    Comment: social drinker   Drug use: No   Sexual activity: Not Currently  Other Topics Concern   Not on file  Social History Narrative   Lost first wife remotely.   Widow, lost 2nd wife 08-03-16   Lives alone, no biological children   2 step children ([place], [place])  from second wife , still very close to them      Social Determinants of Health   Financial Resource Strain: Low Risk  (10/01/2020)   Overall Financial Resource Strain (CARDIA)    Difficulty of Paying Living Expenses: Not hard at all  Food Insecurity: No Food Insecurity (06/22/2022)   Hunger Vital Sign    Worried About Running Out of Food in the Last Year: Never true    Ran Out of Food in the Last Year: Never true  Transportation Needs: No  Transportation Needs (06/22/2022)   PRAPARE - Administrator, Civil Service (Medical): No    Lack of Transportation (Non-Medical): No  Physical Activity: Insufficiently Active (10/01/2020)   Exercise Vital Sign    Days of Exercise per Week: 3 days    Minutes of Exercise per Session: 30 min  Stress: No Stress Concern Present (10/01/2020)   [employer] of Occupational Health - Occupational Stress Questionnaire    Feeling of Stress : Not at all  Social Connections: Socially Isolated (10/01/2020)   Social Connection and Isolation Panel [NHANES]    Frequency of Communication with Friends and Family: More than three times a week    Frequency of Social Gatherings with Friends and Family: More than three times a week    Attends Religious Services: Never    Database administrator or Organizations: No    Attends Banker Meetings: Never    Marital Status: Widowed   Past Surgical History:  Procedure Laterality Date   TOTAL KNEE ARTHROPLASTY Right 2005   TOTAL KNEE ARTHROPLASTY  12/04/2011   LEFT-- TOTAL KNEE ARTHROPLASTY;  Surgeon: Eugenia Mcalpine, MD;  Location: [facility];  Service: Orthopedics;  Laterality: Left;   Past Surgical History:  Procedure Laterality Date   TOTAL KNEE ARTHROPLASTY Right 2005   TOTAL KNEE ARTHROPLASTY  12/04/2011   LEFT-- TOTAL KNEE ARTHROPLASTY;  Surgeon: Eugenia Mcalpine, MD;  Location: [facility];  Service: Orthopedics;  Laterality: Left;   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,had a MRI, was prescribed testosterone by endocrinology   Sleep apnea    does't use cpap   Sudden hearing loss, left 2017   idiopathic sensorineural hearing loss   BP 132/78   Pulse 74   Ht 5\' 9"  (1.753 m)   Wt 198 lb (89.8 kg)   SpO2 96%    BMI 29.24 kg/m   Opioid Risk Score:   Fall Risk Score:  `1  Depression screen PHQ 2/9     12/04/2022    1:44 PM 10/12/2022    2:13 PM 09/04/2022    1:32 PM 03/03/2022    8:54 AM 08/19/2021    8:01 AM 02/26/2021    8:55 AM 11/21/2020    8:17 AM  Depression screen PHQ 2/9  Decreased Interest 1 0 0 0 0 0 0  Down, Depressed, Hopeless 1 0 0 0 0 0 0  PHQ - 2 Score 2 0 0 0 0 0 0  Altered sleeping   0  Tired, decreased energy   0      Change in appetite   0      Feeling bad or failure about yourself    0      Trouble concentrating   0      Moving slowly or fidgety/restless   0      Suicidal thoughts   0      PHQ-9 Score   0         Review of Systems  Constitutional: Negative.   HENT: Negative.    Eyes: Negative.   Respiratory: Negative.    Cardiovascular: Negative.   Gastrointestinal: Negative.   Endocrine: Negative.   Genitourinary: Negative.   Musculoskeletal:  Positive for gait problem.  Skin: Negative.   Allergic/Immunologic: Negative.   Hematological: Negative.   Psychiatric/Behavioral:  Positive for dysphoric mood.   All other systems reviewed and are negative.      Objective:   Physical Exam General No acute distress Mood and affect are appropriate Motor strength is 3 - right deltoid to minus bicep tricep trace finger flexion 0 finger extension 0 wrist flexion extension. Right lower extremity strength Hip flexion to minus knee extension 4 - with the hip extensor synergy 3 - at the ankle dorsiflexor plantar flexor Sensation is intact to light touch in the right upper and right lower limb. Speech with evidence of dysarthria.  He does have some minimal word finding issues. Gait was not tested did not have his assistive device with him.       Assessment & Plan:  1.  Left MCA distribution infarct with right hemiparesis decreased fine motor decreased balance.  Overall he is making good recovery and is approximately 4 months post stroke.  Continue outpatient  therapy I will see him back at the conclusion around the beginning of January.

## 2022-12-04 NOTE — Patient Instructions (Signed)
Stroke Prevention Some medical conditions and lifestyle choices can lead to a higher risk for a stroke. You can help to prevent a stroke by eating healthy foods and exercising. It also helps to not smoke and to manage any health problems you may have. How can this condition affect me? A stroke is an emergency. It should be treated right away. A stroke can lead to brain damage or threaten your life. There is a better chance of surviving and getting better after a stroke if you get medical help right away. What can increase my risk? The following medical conditions may increase your risk of a stroke: Diseases of the heart and blood vessels (cardiovascular disease). High blood pressure (hypertension). Diabetes. High cholesterol. Sickle cell disease. Problems with blood clotting. Being very overweight. Sleeping problems (obstructivesleep apnea). Other risk factors include: Being older than age 43. A history of blood clots, stroke, or mini-stroke (TIA). Race, ethnic background, or a family history of stroke. Smoking or using tobacco products. Taking birth control pills, especially if you smoke. Heavy alcohol and drug use. Not being active. What actions can I take to prevent this? Manage your health conditions High cholesterol. Eat a healthy diet. If this is not enough to manage your cholesterol, you may need to take medicines. Take medicines as told by your doctor. High blood pressure. Try to keep your blood pressure below 130/80. If your blood pressure cannot be managed through a healthy diet and regular exercise, you may need to take medicines. Take medicines as told by your doctor. Ask your doctor if you should check your blood pressure at home. Have your blood pressure checked every year. Diabetes. Eat a healthy diet and get regular exercise. If your blood sugar (glucose) cannot be managed through diet and exercise, you may need to take medicines. Take medicines as told by your  doctor. Talk to your doctor about getting checked for sleeping problems. Signs of a problem can include: Snoring a lot. Feeling very tired. Make sure that you manage any other conditions you have. Nutrition  Follow instructions from your doctor about what to eat or drink. You may be told to: Eat and drink fewer calories each day. Limit how much salt (sodium) you use to 1,500 milligrams (mg) each day. Use only healthy fats for cooking, such as olive oil, canola oil, and sunflower oil. Eat healthy foods. To do this: Choose foods that are high in fiber. These include whole grains, and fresh fruits and vegetables. Eat at least 5 servings of fruits and vegetables a day. Try to fill one-half of your plate with fruits and vegetables at each meal. Choose low-fat (lean) proteins. These include low-fat cuts of meat, chicken without skin, fish, tofu, beans, and nuts. Eat low-fat dairy products. Avoid foods that: Are high in salt. Have saturated fat. Have trans fat. Have cholesterol. Are processed or pre-made. Count how many carbohydrates you eat and drink each day. Lifestyle If you drink alcohol: Limit how much you have to: 0-1 drink a day for women who are not pregnant. 0-2 drinks a day for men. Know how much alcohol is in your drink. In the [place] one drink equals one 12 oz bottle of beer ( ), one 5 oz glass of wine ( ), or one 1 oz glass of hard liquor (44mL). Do not smoke or use any products that have nicotine or tobacco. If you need help quitting, ask your doctor. Avoid secondhand smoke. Do not use drugs. Activity  Try to stay at a  healthy weight. Get at least 30 minutes of exercise on most days, such as: Fast walking. Biking. Swimming. Medicines Take over-the-counter and prescription medicines only as told by your doctor. Avoid taking birth control pills. Talk to your doctor about the risks of taking birth control pills if: You are over 94 years old. You smoke. You get  very bad headaches. You have had a blood clot. Where to find more information American Stroke Association: www.strokeassociation.org Get help right away if: You or a loved one has any signs of a stroke. "BE FAST" is an easy way to remember the warning signs: B - Balance. Dizziness, sudden trouble walking, or loss of balance. E - Eyes. Trouble seeing or a change in how you see. F - Face. Sudden weakness or loss of feeling of the face. The face or eyelid may droop on one side. A - Arms. Weakness or loss of feeling in an arm. This happens all of a sudden and most often on one side of the body. S - Speech. Sudden trouble speaking, slurred speech, or trouble understanding what people say. T - Time. Time to call emergency services. Write down what time symptoms started. You or a loved one has other signs of a stroke, such as: A sudden, very bad headache with no known cause. Feeling like you may vomit (nausea). Vomiting. A seizure. These symptoms may be an emergency. Get help right away. Call your local emergency services (911 in the [place] Do not wait to see if the symptoms will go away. Do not drive yourself to the hospital. Summary You can help to prevent a stroke by eating healthy, exercising, and not smoking. It also helps to manage any health problems you have. Do not smoke or use any products that contain nicotine or tobacco. Get help right away if you or a loved one has any signs of a stroke. This information is not intended to replace advice given to you by your health care provider. Make sure you discuss any questions you have with your health care provider. Document Revised: 12/22/2021 Document Reviewed: 12/22/2021 Elsevier Patient Education  2024 [employer].

## 2022-12-16 ENCOUNTER — Ambulatory Visit: Payer: Medicare HMO | Attending: Internal Medicine | Admitting: Physical Therapy

## 2022-12-16 ENCOUNTER — Ambulatory Visit: Payer: Medicare HMO | Admitting: Occupational Therapy

## 2022-12-16 VITALS — BP 151/80 | HR 93

## 2022-12-16 DIAGNOSIS — R2681 Unsteadiness on feet: Secondary | ICD-10-CM | POA: Diagnosis not present

## 2022-12-16 DIAGNOSIS — M6281 Muscle weakness (generalized): Secondary | ICD-10-CM

## 2022-12-16 DIAGNOSIS — R278 Other lack of coordination: Secondary | ICD-10-CM | POA: Insufficient documentation

## 2022-12-16 DIAGNOSIS — I69351 Hemiplegia and hemiparesis following cerebral infarction affecting right dominant side: Secondary | ICD-10-CM | POA: Diagnosis not present

## 2022-12-16 DIAGNOSIS — S43111D Subluxation of right acromioclavicular joint, subsequent encounter: Secondary | ICD-10-CM | POA: Diagnosis not present

## 2022-12-16 NOTE — Patient Instructions (Signed)
Shoulder ROM  Access Code: [email or web address] URL: [email or web address] Date: 12/16/2022 Prepared by: Amada Kingfisher  Exercises - Supine Shoulder Flexion AAROM with Hands Clasped  - 1 x daily - 10 reps - Seated Shoulder Flexion Towel Slide at Table Top  - 1 x daily - 10 reps - Seated Shoulder Flexion Self PROM  - 1 x daily - 10 reps

## 2022-12-16 NOTE — Therapy (Signed)
OUTPATIENT PHYSICAL THERAPY NEURO TREATMENT   Patient Name: Jimmy Valentine MRN: [id number] DOB:04/13/1950, 72 y.o., male Today's Date: 12/16/2022    PCP: Wanda Plump, MD REFERRING PROVIDER: Wanda Plump, MD    END OF SESSION:  PT End of Session - 12/16/22 1238     Visit Number 14    Number of Visits 25   recert   Date for PT Re-Evaluation 01/26/23   to allow for delays in scheduling   Authorization Type [employer]    PT Start Time 1234    PT Stop Time 1315    PT Time Calculation (min) 41 min    Equipment Utilized During Treatment Gait belt    Activity Tolerance Patient tolerated treatment well    Behavior During Therapy [facility] for tasks assessed/performed               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,had a MRI, was prescribed testosterone by endocrinology   Sleep apnea    does't use cpap   Sudden hearing loss, left 2017   idiopathic sensorineural hearing loss   Past Surgical History:  Procedure Laterality Date   TOTAL KNEE ARTHROPLASTY Right 2005   TOTAL KNEE ARTHROPLASTY  12/04/2011   LEFT-- TOTAL KNEE ARTHROPLASTY;  Surgeon: Eugenia Mcalpine, MD;  Location: [facility];  Service: Orthopedics;  Laterality: Left;   Patient Active Problem List   Diagnosis Date Noted   Hemiparesis affecting right side as late effect of cerebrovascular accident (CVA) (HCC) 09/04/2022   Gait disturbance, post-stroke 09/04/2022   Hyponatremia 07/18/2022   Aphasia due to acute cerebrovascular accident (CVA) (HCC) 07/15/2022   Acute ischemic left MCA stroke (HCC) 06/24/2022   Stroke (cerebrum) (HCC) 06/21/2022   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 10/18/2014   Elevated LFTs 04/13/2014   Diabetes (HCC) 09/03/2011   Gout 09/03/2011   OSA (obstructive sleep apnea) 04/22/2011   Annual physical exam 12/04/2010   Hypertension    Hyperlipidemia    Hypogonadism male    Erectile dysfunction     ONSET DATE: 09/23/2022 (referral)   REFERRING DIAG: U44.034 (ICD-10-CM) - Hemiparesis affecting right side as late effect of cerebrovascular accident (CVA) (HCC)  THERAPY DIAG:  Hemiplegia and hemiparesis following cerebral infarction affecting right dominant side (HCC)  Unsteadiness on feet  Muscle weakness (generalized)  Other lack of coordination  Rationale for Evaluation and Treatment: Rehabilitation  SUBJECTIVE:  SUBJECTIVE STATEMENT:  Pt reports doing well, has been lazy this past week due to not having therapy. Denies pain or acute changes   Pt accompanied by:  Caregiver, [name] (in lobby)   PERTINENT HISTORY: L MCA CVA in May 2024   PAIN:  Are you having pain? No  PRECAUTIONS: Fall  WEIGHT BEARING RESTRICTIONS: No  FALLS: Has patient fallen in last 6 months? Yes. Number of falls 1  LIVING ENVIRONMENT: Lives with: lives alone Lives in: House/apartment Stairs: Yes: External: 3 steps; on right going up, on left going up, and can reach both Has following equipment at home: Walker - 2 wheeled, Wheelchair (manual), shower chair, bed side commode, and AFO  PLOF: Independent  PATIENT GOALS: "Get back to where I can walk by myself without the walker and without assistance"   OBJECTIVE:   DIAGNOSTIC FINDINGS: MRI of brain on 06/22/22  IMPRESSION: 1. Positive for Left MCA territory lacunar type infarct tracking from the left corona radiata to the lentiform. Petechial hemorrhage (Heidelberg  classification 1b: HI2). No malignant hemorrhagic transformation. No significant mass effect.   2. No other acute intracranial abnormality. 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  CT angio of head/neck on 06/21/22 IMPRESSION: 1. 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 The left MCA bi/trifurcation and left M2 branches remain patent.   2. Superimposed Severe intracranial and cervical ICA Atherosclerosis resulting in: - distal Right Vertebral Artery (V4) OCCLUSION. - distal Left Vertebral Artery Severe (V4) stenosis. - Right ICA origin RADIOGRAPHIC STRING SIGN stenosis due to calcified plaque. - Left ICA origin stenosis due to complex plaque estimated 65-70%. - Severe Left ICA supraclinoid stenosis due to calcified plaque.  COGNITION: Overall cognitive status: Within functional limits for tasks assessed   SENSATION: Pt denies numbness/tingling in RLE/RUE   COORDINATION: Heel to shin test: WNL on LLE, unable to perform hip flexion on RLE but able to achieve cross-legged position    MUSCLE TONE: Mild clonus in R foot that fatigues after 3s   POSTURE: rounded shoulders and forward head   LOWER EXTREMITY MMT:  Tested in seated position   MMT Right Eval Left Eval  Hip flexion 2- 4+  Hip extension    Hip abduction 1 4+  Hip adduction 2 4+  Hip internal rotation    Hip external rotation    Knee flexion 3 4+  Knee extension 2 4+  Ankle dorsiflexion 4- 4+  Ankle plantarflexion    Ankle inversion    Ankle eversion    (Blank rows = not tested)  BED MOBILITY:  Has a rail but requires assistance (up to max) to roll and perform sit <>supine   TRANSFERS: Assistive device utilized: Environmental consultant - 2 wheeled  Sit to stand: SBA Stand to sit: SBA Pt  kickstands RLE and leans to L side to perform    GAIT: Gait pattern: step to pattern, decreased step length- Right, decreased stride length, decreased hip/knee flexion- Right, decreased ankle dorsiflexion- Right, Left hip hike, lateral lean- Left, and poor foot clearance- Right Distance walked: Various clinic distances  Assistive device utilized: Walker - 2 wheeled Level of assistance: CGA and Min A Comments: Pt not wearing AFO this date. Noted pt dragging RLE on ground, especially w/fatigue, and would lose balance anteriorly requiring min A to stabilize.  Pt also vaults to progress RLE and frequently tips RW over on L side.    VITALS  Vitals:   12/16/22 1242  BP: (!) 151/80  Pulse: 93    TODAY'S TREATMENT:       TherAct Assessed vitals (see above) and systolic elevated but WNL for therapy   Gait Training  Gait pattern:  ER of RLE, step through pattern, decreased stride length, decreased hip/knee flexion- Right, decreased ankle dorsiflexion- Right, lateral hip instability, lateral lean- Left, narrow BOS, and poor foot clearance- Right Distance walked: 115' Assistive device utilized: Environmental consultant - 2 wheeled and R AFO Level of assistance: SBA Comments: Noted pt dragging R foot on ground this date when ambulating into session. Donned blue theraband around RLE to facilitate increased hip/knee flexion and pt able to facilitate step-through pattern w/increased step clearance with use of band.   NMR  In // bars for improved hip/knee flexion of RLE and stair training. Blue theraband present around RLE throughout:  Step ups to 6" step leading w/LLE fwd and RLE retro, x10 reps w/LUE support on rail and CGA. Pt relies on circumduction to progress RLE to step and assumes narrow BOS when bringing RLE off step. Min cues for wider BOS when stepping down w/RLE for improved BOS and stability when off step and pt able to perform well.  Step taps w/RLE to 6" step, x10 reps w/LUE support. Pt relied heavily on  momentum and lateral truncal lean to L to perform. Once fatigued, pt unable to bring leg to 6" height, so provided seated rest break.  Removed blue theraband. At mat table, seated march overs using 5# KB, x15 reps per side, for improved functional core and hip strength. Pt initially unable to perform on RLE but after a few failed reps, was able to clear KB fully by using some momentum and posterior lean of trunk. Pt reports he will work on this at home.                                                                                                               PATIENT EDUCATION:  Education details: Continue HEP  Person educated: Patient Education method: Medical illustrator Education comprehension: verbalized understanding and returned demonstration  HOME EXERCISE PROGRAM:  Access Code: [email or web address] URL: [email or web address] Date: 11/12/2022 Prepared by: Alethia Berthold Everlyn Farabaugh  Exercises - Supine Figure 4 Piriformis Stretch  - 1 x daily - 7 x weekly - 3 sets - 1-3 minute hold - Seated Piriformis Stretch  - 1 x daily - 7 x weekly - 3 sets - 1-3 minute hold  GOALS: Goals reviewed with patient? Yes  SHORT TERM GOALS: Target date: 10/28/2022    Pt will perform initial HEP w/min A from caregiver for improved strength, balance, transfers and gait.  Baseline: to be established  Goal status: IN PROGRESS  2.  Pt will improve gait velocity to at least 0.7 ft/s w/RW and CGA for improved gait efficiency and independence   Baseline: 0.49 ft/s w/RW and min A; 0.66 ft/s  w/RW and SBA Goal status: PARTIALLY MET   3.  Pt will be able to don and doff R hand orthotic on RW w/CGA for improved independence and weight shift to R side  Baseline: max A; min A; SBA (10/3)  Goal status: MET   4.  Pt will ambulate greater than or equal to 300 feet on with LRAD and CGA for improved cardiovascular endurance and BLE strength.  Baseline: 213 ft with RW and min A (9/17); 195' w/RW and SBA   Goal status: PARTIALLY MET   5.  Berg to be assessed and LTG written  Baseline: 25/56 Goal status: MET  6.  Pt will perform supine > sit EOB w/min A or use of rail for improved independence and functional strength Baseline: mod-max A; min A (9/26) Goal status: MET  LONG TERM GOALS: Target date: 11/25/2022    Pt will be independent with final HEP for improved strength, balance, transfers and gait.  Baseline: "going alright" Goal status: In Progress  2.  Pt will improve gait velocity to at least 1.0 ft/s w/LRAD and SBA for improved gait efficiency and independence   Baseline: 0.49 ft/s w/RW and min A; 0.66 ft/s w/RW and SBA, 0.60 ft/s w/ RW and close SBA (12/01/22) Goal status: NOT MET  3.  Pt will ambulate greater than or equal to 400 feet on with LRAD and CGA for improved cardiovascular endurance and BLE strength.   Baseline: 213 ft with RW and min A (9/17), 195' w/RW and SBA, 208 with RW and CGA (12/01/22) Goal status: NOT MET  4.  Pt will improve Berg score to 34/56 for decreased fall risk  Baseline: 25/56, 35/56 (12/01/22) Goal status: MET  5.  Pt will perform bed mobility at CGA or SBA level for improved independence at home  Baseline: mod-max A, Mod I Goal status: MET    RECERTIFICATION SHORT TERM GOALS=  12/22/2022   1.  Pt will improve gait velocity to at least 0.75 ft/s w/ LRAD and SBA for improved gait efficiency and independence. Baseline: 0.60 ft/s w/ RW and close SBA (12/01/22) Goal status: INITIAL   RECERTIFICATION LONG TERM GOALS:  Target date: 01/12/2023   Pt will improve gait velocity to at least 1.0 ft/s w/ LRAD and SBA for improved gait efficiency and independence. Baseline: 0.60 ft/s w/ RW and close SBA (12/01/22) Goal status: INITIAL  2.  Pt will improve Berg score to 39/56 for decreased fall risk Baseline: 35/56 (12/01/22) Goal status: INITIAL  3.  Pt will ambulate greater than or equal to 400 feet on with LRAD and CGA for  improved cardiovascular endurance and BLE strength.  Baseline: 208 FT with RW and CGA (12/01/22) Goal status: INITIAL  4.  Pt will safely transfer from the floor to standing with Mod A or less using proper body mechanics Baseline:  Goal status: INITIAL   ASSESSMENT:  CLINICAL IMPRESSION:  Emphasis of skilled PT session on gait training w/facilitation of R knee/hip flexion, functional hip and core strength. Pt initially dragging his RLE when ambulating into clinic, stating he has "been lazy" the past two weeks. However, pt able to facilitate increased step clearance and length w/hip and knee flexion facilitation via blue resistance band. Pt has significantly improved his hip/knee flexion strength since starting therapy and will continue to benefit from PT for improved independence and endurance. Continue POC.   OBJECTIVE IMPAIRMENTS: Abnormal gait, decreased activity tolerance, decreased balance, decreased coordination, decreased mobility, difficulty walking, decreased  strength, impaired tone, impaired UE functional use, improper body mechanics, and pain.   ACTIVITY LIMITATIONS: carrying, lifting, standing, squatting, stairs, transfers, bed mobility, bathing, toileting, dressing, self feeding, reach over head, hygiene/grooming, locomotion level, and caring for others  PARTICIPATION LIMITATIONS: meal prep, cleaning, laundry, medication management, personal finances, interpersonal relationship, driving, shopping, community activity, and yard work  PERSONAL FACTORS: Fitness, Past/current experiences, Transportation, and 1 comorbidity: CVA  are also affecting patient's functional outcome.   REHAB POTENTIAL: Good  CLINICAL DECISION MAKING: Evolving/moderate complexity  EVALUATION COMPLEXITY: Moderate  PLAN:  PT FREQUENCY: 2x/week  PT DURATION: 8 weeks (POC written for 10 weeks due to delay in scheduling) + 6 weeks (recert)  PLANNED INTERVENTIONS: Therapeutic exercises, Therapeutic  activity, Neuromuscular re-education, Balance training, Gait training, Patient/Family education, Self Care, Joint mobilization, Stair training, Orthotic/Fit training, DME instructions, Aquatic Therapy, Dry Needling, Electrical stimulation, Manual therapy, and Re-evaluation  PLAN FOR NEXT SESSION: Continue TM training? Will need +2 if doing tall kneel activities. Prone- decrease assist amount? continue with Bioness to R HS and ant tib; RLE NMR, weight shift to R side, work on bed mobility and donning/doffing RUE from hand orthotic and donning/doffing AFO more independently; can trial hemiwalker ambulation- sling for RUE?, stepping activities at parallel/ballet bars, pt wants to do stair activities (step ups/taps?), needs to schedule 2x/week for 6 more weeks PT and OT   Jill Alexanders Braelee Herrle, PT, DPT [facility] [address] [place], [place]  [address] Phone:  [phone] Fax:  [phone]  12/16/2022, 1:34 PM

## 2022-12-16 NOTE — Therapy (Unsigned)
OUTPATIENT OCCUPATIONAL THERAPY NEURO TREATMENT  Patient Name: Jimmy Valentine MRN: [id number] DOB:1950/08/11, 72 y.o., male Today's Date: 12/16/2022  PCP: Wanda Plump, MD REFERRING PROVIDER: Wanda Plump, MD  END OF SESSION:  OT End of Session - 12/16/22 1313     Visit Number 14    Number of Visits 32   + evaluaiton   Date for OT Re-Evaluation 02/05/23    Authorization Type [employer] MC 2024 Met VL: [place] Auth Not Reqd    OT Start Time 1315    OT Stop Time 1400    OT Time Calculation (min) 45 min    Equipment Utilized During Treatment knife, cones, theraband    Activity Tolerance Patient tolerated treatment well    Behavior During Therapy WFL for tasks assessed/performed             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,had a MRI, was prescribed testosterone by endocrinology   Sleep apnea    does't use cpap   Sudden hearing loss, left 2017   idiopathic sensorineural hearing loss   Past Surgical History:  Procedure Laterality Date   TOTAL KNEE ARTHROPLASTY Right 2005   TOTAL KNEE ARTHROPLASTY  12/04/2011   LEFT-- TOTAL KNEE ARTHROPLASTY;  Surgeon: Eugenia Mcalpine, MD;  Location: [facility];  Service: Orthopedics;  Laterality: Left;   Patient Active Problem List   Diagnosis Date Noted   Hemiparesis affecting right side as late effect of cerebrovascular accident (CVA) (HCC) 09/04/2022   Gait disturbance, post-stroke 09/04/2022   Hyponatremia 07/18/2022   Aphasia due to acute cerebrovascular accident (CVA) (HCC) 07/15/2022   Acute ischemic left MCA stroke (HCC) 06/24/2022   Stroke (cerebrum) (HCC) 06/21/2022   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 10/18/2014   Elevated LFTs 04/13/2014   Diabetes (HCC) 09/03/2011   Gout 09/03/2011   OSA (obstructive sleep apnea) 04/22/2011   Annual physical exam 12/04/2010   Hypertension    Hyperlipidemia    Hypogonadism male    Erectile dysfunction     ONSET DATE: Referral date: 10/01/2022  Stroke 06/21/22  REFERRING DIAG: I69.351 (ICD-10-CM) - Hemiplegia and hemiparesis following cerebral infarction affecting right dominant side (HCC)  THERAPY DIAG:   Hemiplegia and hemiparesis following cerebral infarction affecting right dominant side (HCC)  Muscle weakness (generalized)  Other lack of coordination  Subluxation of acromioclavicular joint, right, subsequent encounter  Rationale for Evaluation and Treatment: Rehabilitation  SUBJECTIVE:   SUBJECTIVE STATEMENT:  Pt reports that he can still only do little things with his R hand and that he hasn't been having spasms this week  Pt accompanied by: self  PERTINENT HISTORY:  Presented to [facility] ED on 06/21/2022 with sudden onset of right-sided weakness, dysarthria and facial drooping. MRI showed large MCA territory basal ganglia infarct with petechial hemorrhage with no hemorrhagic transformation or mass effect.   Inpatient Rehab: 06/24/2022 - 07/30/2022 Patient discharged at [facility] Assist level.  Patient's care partners able to provide the necessary physical assistance at discharge.   Recommendation:  Patient will benefit from ongoing skilled OT services in home health setting  to continue to advance functional skills in the area of BADL, iADL, and Reduce care partner burden. Equipment: Shower chair, commode, RW, R RW hand splint, R resting hand splint, LH sponge, Dycem   Home Health therapies 07/24 - 08/24     PRECAUTIONS: Fall  WEIGHT BEARING RESTRICTIONS: No  PAIN:  Are you having pain? No   FALLS: Has patient fallen in last 6 months? Yes. Number of falls 1x - fell on his bottom  about a month prior to evaluation - got the walker stuck on the doorframe  11/26/22 - had spasms in the AM and fell in PM trying to sit down but fell forwards instead  LIVING ENVIRONMENT: Lives with: lives alone and Caregiver 3-4 days/week x 12 hours, daughter, son and brother-in-law assist on other days Lives in: House/apartment Stairs: Yes: External: 3-5 steps; can reach both Has following equipment at home: Environmental consultant - 2 wheeled, Wheelchair (manual), Shower bench, bed side commode, Grab bars, and urinal  PLOF: Independent - still driving, played golf, retired Immunologist with lots traveling (driving)  PATIENT GOALS: To get my right side back working again  OBJECTIVE:   HAND DOMINANCE: Right (affected side)  ADLs: Overall ADLs: Min to mod assistance Transfers/ambulation related to ADLs: Supervision Eating: Gets help to cut food as needed, having to use L hand Grooming: Used regular razor yesterday for the first time UB Dressing: Needs help to get R arm in sleeve LB Dressing: Can pull pants up most of the time Toileting: Most of the time he is able to perform on his own; uses urinal at bedside at night Bathing: "50/50" with caregiver Tub Shower transfers: Gets help to get his leg into the tub Equipment: Transfer tub bench, Grab bars, bed side commode, and Long handled sponge  IADLs: Shopping: caregiver and family (daughter) Light housekeeping: Caregiver Meal Prep: Psychologist, counselling mobility: RW with hand orthosis & dycem + strap for R hand Medication management: Assistance from caregivers Financial management: Assistance from family Handwriting: unable with R UE; not assessed with L UE  MOBILITY STATUS: Needs Assist: Help to get his R UE/hand on the handle of his walker and Hx of falls  POSTURE COMMENTS:  rounded shoulders and forward head Sitting balance:  WFL in arm chair at table  ACTIVITY TOLERANCE: Activity tolerance: Limited due to R hemiplegia  FUNCTIONAL  OUTCOME MEASURES: PSFS - 1.0  12/03/22 PSFS = 4.7  UPPER EXTREMITY ROM:    Active ROM Right eval Left eval  Shoulder flexion  [facility]  Shoulder abduction    Shoulder adduction    Shoulder extension min   Shoulder internal rotation    Shoulder external rotation    Elbow flexion min   Elbow extension    Wrist flexion min   Wrist extension    Wrist ulnar deviation    Wrist radial deviation    Wrist pronation    Wrist supination    (Blank rows = not tested)  UPPER EXTREMITY MMT:     MMT Right eval Left eval  Shoulder flexion  4/5  Shoulder abduction    Shoulder adduction    Shoulder extension    Shoulder internal rotation    Shoulder external rotation    Middle trapezius    Lower trapezius    Elbow flexion    Elbow extension    Wrist flexion    Wrist extension    Wrist ulnar deviation    Wrist radial deviation    Wrist pronation  Wrist supination    (Blank rows = not tested)  HAND FUNCTION: Grip strength: Right: 3.9, 3.7, 5.2 lbs; Left: 34.8, 36.5, 37.9 lbs Average: Right 4.3 lbs Left 36.4 lbs  12/03/22: Right 6.1, 6.6, 4.8  Left 38.3, 39.4, 38.8 Average: Right 5.8 lbs Left 38.8 lbs  COORDINATION: Unable to perform R UE coordination tasks,   SENSATION: WFL  EDEMA: Slight RUE  MUSCLE TONE: RUE: Hypotonic and Flaccid  COGNITION: Overall cognitive status: Within functional limits for tasks assessed and h/o more severe aphasia and does self-report need to think about what he is trying to say etc  VISION: Subjective report: Eye Dr ~ 3 weeks Baseline vision: Wears glasses for reading only Visual history: cataracts  VISION ASSESSMENT: TBA  Patient is not having difficulty with activities due to vision.  PERCEPTION: Not tested  PRAXIS: Not tested  OBSERVATIONS: Pt ambulated into clinic w/RW and R hand orthosis. Patient was wearing his AFO which he stated he stopped wearing after he finished home health therapy.   TODAY'S TREATMENT:                                                                                                                                Self Care: Pt engaged in B UE use for cutting a banana as he reported having difficulty with this at previous session.  Pt encouraged to 'grab' the banana several times first ie) to put it on the table.  The item is put in his grasp first and then he had to squeeze it and pick it up onto the table with forearm support by OTR.  Pt shown how to position R hand to stabilize banana to let him cut the peel with knife in L hand and then how to use a large handled spoon in R hand to block/stabilize the banana while cutting it.  Pt engaged in using RUE movements practiced during NRE to help with UB dressing ie) lifting R hand up to sleeve hole in his lap, reaching into sleeve with L hand to find R hand and then trying to lift RUE overhead to allow the sleeve to slide down to his elbow.  Pt encouraged to consider lifting shirt over his head after dressing R arm and finally pushing his L  hand through the other sleeve.  Neuromuscular Reed: Neuromuscular reeducation provided, including specific visual, tactile and verbal cues and physical assistance to stimulate the neuromuscular system and promote functional ROM, movement and use of RUE for HEP exercises per new printed pt instructions for shoulder flexion activities due to difficulty even with PROM of shoulder from seated position ie) during dressing activities.  Focussed on R shoulder ROM with assistance of LUE during table slides and with trying to lift arm during dressing activities.  Pt has difficulty lifting arm greater than 45 degrees which makes sliding T-shirt sleeve down his arm difficulty using gravity.  In addition, OTR supported LUE with looped resistance  band - at elbow and through hand, while pt worked in gravity eliminated plane to knock over small objects/cones, as well as try and pick up stabilized cones by sliding his hand over the top and  squeezing to hold item.  Pt initially feels he is only doing 20% of the work when supported by [employer] but feels like his is doing more with the band for support (as held by OTR to provide gravity eliminated plane).  PATIENT EDUCATION: Education details: RUE HEP and continued OT POC Person educated: Patient Education method: Explanation, Demonstration, Tactile cues, and Verbal cues Education comprehension: verbalized understanding, returned demonstration, verbal cues required, tactile cues required, and needs further education  HOME EXERCISE PROGRAM:  10/20/22 - UE ROM (grip, forearm, elbow flex, shoulder shrugs) - no images provided only written suggestions. 10/27/2022: updated ROM HEP Access Code [email or web address] 11/03/22 - sleep positioning handout provided  GOALS: Goals reviewed with patient? Yes  SHORT TERM GOALS: Target date: 12/31/22  Patient will demonstrate updated RUE HEP with 25% verbal cues or less for proper execution.  Baseline: New to outpatient OT.  Self reported difficulty with motivation at home Goal status: MET 11/19/22 - Pt reported completing HEP. Pt demo'd understanding of exercises with approx. 40% v/c. 12/03/22 - splint 2x/day, stretching hand/wrist, shoulder elevations, squeezing ball   2.  Patient will demonstrate at least 8+ lbs RUE grip strength as needed to stabilize container on tabletop/hold large handled object.  Baseline: Right 4.3 lbs Goal status: IN progress 11/19/22 - Right: 5 lbs, with support from OT and pt's LUE for initial grasp positioning of RUE 12/03/22 - Average: Right 5.8 lbs Left 38.8 lbs  3.  Patient will be able to don T-shirt without physical assistance x 5 days in a row. Baseline: Caregiver assistance to get R UE in sleeve Goal status: IN Progress 11/19/22 - Pt dons t-shirt on LUE and head, then caregiver provides assistance for RUE in sleeve.  4.  Patient will improve R UE AROM for shoulder elevation and elbow flexion to lift arm/hand to  walker.  Baseline: Caregiver assist or uses LUE.  Trace elbow/shoulder movement Goal status: IN Progress 11/19/22 - Pt reports "sometimes" placing RUE on walker without LUE assistance though positioning requires full body compensatory movements. Pt primarily continues to use LUE.  5.  Patient will be assisted to don/doff appropriate splints/positioning devices to minimize subluxation and minimize risk of RUE/hand contracture. Baseline: None place Goal status: MET  11/19/22 - Pt reports wearing splint at least 2-3 hours per day, not currently wearing at night d/t discomfort.    LONG TERM GOALS: Target date: 01/28/23  Patient will demonstrate updated RUE HEP with visual schedule and images for proper execution and frequency. Baseline: New to outpatient OT.  Self reported difficulty with motivation at home. Goal status: IN Progress  2.  Patient will demonstrate at least 10 lbs RUE grip strength as needed to hold objects for manipulation by LUE. Baseline: Right 4.3 lbs Goal status: IN Progress 12/03/22 - Average: Right 5.8 lbs Left 38.8 lbs  3.  Patient will be able to get to EOB with no physical assistance to toilet with BSC as needed. Baseline: Assistance to get to EOB from caregivers. Goal status: MET 11/19/22 - Pt continues to use urinal in bed. Pt reports improved ability to ind transition to EOB and standing.   4.  Patient will be able to prepare simple food items with appropriate AE (small crock pot, microwave, toaster oven etc) Baseline:  Caregiver/family perform meal prep  Goal status: IN Progress 11/12/22 - showed pt walker tray for home use  5.  Patient will report at least two-point increase in average PSFS score or at least three-point increase in a single activity score indicating functionally significant improvement given minimum detectable change. Baseline: 1.0 total score (See above for individual activity scores)  Goal status: MET and updated to continue to work towards  additional 3 pt improvement s/p prog note 12/03/22 12/03/22 PSFS = 4.7   ASSESSMENT:  CLINICAL IMPRESSION: Patient is a 72 y.o. male who was seen today for occupational therapy treatment s/p CVA 09/04/22 with R hemiplegia.  Patient continues to be below baseline level of function with ADLs and IADLS but no longer needs 24/7 assistance from caregivers and is alone at home at night.  R hemiplegia remains but he is able to lift arm up towards RW handle and continues to be encouraged to incorporate RUE into functional neuro reed tasks during therapy with repetition and education. Pt would benefit from skilled OT services in the outpatient setting to work on impairments as noted below to help pt return to PLOF as able.    PERFORMANCE DEFICITS: in functional skills including ADLs, coordination, dexterity, proprioception, sensation, tone, ROM, strength, flexibility, Fine motor control, Gross motor control, mobility, balance, endurance, decreased knowledge of use of DME, skin integrity, and UE functional use, cognitive skills including energy/drive, safety awareness, and sequencing, and psychosocial skills including coping strategies, environmental adaptation, and routines and behaviors.   IMPAIRMENTS: are limiting patient from ADLs, IADLs, rest and sleep, leisure, and social participation.   CO-MORBIDITIES: may have co-morbidities  that affects occupational performance. Patient will benefit from skilled OT to address above impairments and improve overall function.  REHAB POTENTIAL: Good   PLAN:  OT FREQUENCY: 1-2x/week  OT DURATION: additional 8 weeks  PLANNED INTERVENTIONS: self care/ADL training, therapeutic exercise, therapeutic activity, neuromuscular re-education, manual therapy, passive range of motion, balance training, functional mobility training, aquatic therapy, splinting, electrical stimulation, patient/family education, cognitive remediation/compensation, visual/perceptual  remediation/compensation, energy conservation, coping strategies training, and DME and/or AE instructions  RECOMMENDED OTHER SERVICES: PT treatment in place  CONSULTED AND AGREED WITH PLAN OF CARE: Patient and family member/caregiver  PLAN FOR NEXT SESSION:   ADLS/IADLS - pt to bring extra shirt; work in Warden/ranger and NRE for [employer]  Review and progress RUE treatment options Shoulder ROM, stability/weightbearing - hard to lift RUE to/above shoulder height PRN splinting checks   Victorino Sparrow, OT 12/16/2022, 5:20 PM

## 2022-12-22 ENCOUNTER — Ambulatory Visit: Payer: Medicare HMO | Admitting: Physical Therapy

## 2022-12-22 ENCOUNTER — Ambulatory Visit: Payer: Medicare HMO | Admitting: Occupational Therapy

## 2022-12-22 VITALS — BP 146/74 | HR 94

## 2022-12-22 DIAGNOSIS — R2681 Unsteadiness on feet: Secondary | ICD-10-CM | POA: Diagnosis not present

## 2022-12-22 DIAGNOSIS — R278 Other lack of coordination: Secondary | ICD-10-CM | POA: Diagnosis not present

## 2022-12-22 DIAGNOSIS — M6281 Muscle weakness (generalized): Secondary | ICD-10-CM | POA: Diagnosis not present

## 2022-12-22 DIAGNOSIS — I69351 Hemiplegia and hemiparesis following cerebral infarction affecting right dominant side: Secondary | ICD-10-CM | POA: Diagnosis not present

## 2022-12-22 DIAGNOSIS — S43111D Subluxation of right acromioclavicular joint, subsequent encounter: Secondary | ICD-10-CM | POA: Diagnosis not present

## 2022-12-22 NOTE — Therapy (Signed)
OUTPATIENT PHYSICAL THERAPY NEURO TREATMENT   Patient Name: Jimmy Valentine MRN: [id number] DOB:[birth year], 72 y.o., male Today's Date: 12/22/2022    PCP: Wanda Plump, MD REFERRING PROVIDER: Wanda Plump, MD    END OF SESSION:  PT End of Session - 12/22/22 1234     Visit Number 15    Number of Visits 25   recert   Date for PT Re-Evaluation 01/26/23   to allow for delays in scheduling   Authorization Type [employer]    PT Start Time 1230    PT Stop Time 1313    PT Time Calculation (min) 43 min    Equipment Utilized During Treatment Gait belt    Activity Tolerance Patient tolerated treatment well    Behavior During Therapy [facility] for tasks assessed/performed                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,had a MRI, was prescribed testosterone by endocrinology   Sleep apnea    does't use cpap   Sudden hearing loss, left 2017   idiopathic sensorineural hearing loss   Past Surgical History:  Procedure Laterality Date   TOTAL KNEE ARTHROPLASTY Right 2005   TOTAL KNEE ARTHROPLASTY  12/04/2011   LEFT-- TOTAL KNEE ARTHROPLASTY;  Surgeon: Eugenia Mcalpine, MD;  Location: [facility];  Service: Orthopedics;  Laterality: Left;   Patient Active Problem List   Diagnosis Date Noted   Hemiparesis affecting right side as late effect of cerebrovascular accident (CVA) (HCC) 09/04/2022   Gait disturbance, post-stroke 09/04/2022   Hyponatremia 07/18/2022   Aphasia due to acute cerebrovascular accident (CVA) (HCC) 07/15/2022   Acute ischemic left MCA stroke (HCC) 06/24/2022   Stroke (cerebrum) (HCC) 06/21/2022   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 10/18/2014   Elevated LFTs 04/13/2014   Diabetes (HCC) 09/03/2011   Gout 09/03/2011   OSA (obstructive sleep apnea) 04/22/2011   Annual physical exam 12/04/2010   Hypertension    Hyperlipidemia    Hypogonadism male    Erectile dysfunction     ONSET DATE: 09/23/2022 (referral)   REFERRING DIAG: Y86.578 (ICD-10-CM) - Hemiparesis affecting right side as late effect of cerebrovascular accident (CVA) (HCC)  THERAPY DIAG:  Hemiplegia and hemiparesis following cerebral infarction affecting right dominant side (HCC)  Muscle weakness (generalized)  Other lack of coordination  Unsteadiness on feet  Rationale for Evaluation and Treatment: Rehabilitation  SUBJECTIVE:  SUBJECTIVE STATEMENT:  Pt reports doing well. Denies pain or acute changes   Pt accompanied by:  Caregiver, [name] (in lobby)   PERTINENT HISTORY: L MCA CVA in May 2024   PAIN:  Are you having pain? No  PRECAUTIONS: Fall  WEIGHT BEARING RESTRICTIONS: No  FALLS: Has patient fallen in last 6 months? Yes. Number of falls 1  LIVING ENVIRONMENT: Lives with: lives alone Lives in: House/apartment Stairs: Yes: External: 3 steps; on right going up, on left going up, and can reach both Has following equipment at home: Walker - 2 wheeled, Wheelchair (manual), shower chair, bed side commode, and AFO  PLOF: Independent  PATIENT GOALS: "Get back to where I can walk by myself without the walker and without assistance"   OBJECTIVE:   DIAGNOSTIC FINDINGS: MRI of brain on 06/22/22  IMPRESSION: 1. Positive for Left MCA territory lacunar type infarct tracking from the left corona radiata to the lentiform. Petechial hemorrhage (Heidelberg classification 1b: HI2). No malignant  hemorrhagic transformation. No significant mass effect.   2. No other acute intracranial abnormality. 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  CT angio of head/neck on 06/21/22 IMPRESSION: 1. 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 The left MCA bi/trifurcation and left M2 branches remain patent.   2. Superimposed Severe intracranial and cervical ICA Atherosclerosis resulting in: - distal Right Vertebral Artery (V4) OCCLUSION. - distal Left Vertebral Artery Severe (V4) stenosis. - Right ICA origin RADIOGRAPHIC STRING SIGN stenosis due to calcified plaque. - Left ICA origin stenosis due to complex plaque estimated 65-70%. - Severe Left ICA supraclinoid stenosis due to calcified plaque.  COGNITION: Overall cognitive status: Within functional limits for tasks assessed   SENSATION: Pt denies numbness/tingling in RLE/RUE   COORDINATION: Heel to shin test: WNL on LLE, unable to perform hip flexion on RLE but able to achieve cross-legged position    MUSCLE TONE: Mild clonus in R foot that fatigues after 3s   POSTURE: rounded shoulders and forward head   LOWER EXTREMITY MMT:  Tested in seated position   MMT Right Eval Left Eval  Hip flexion 2- 4+  Hip extension    Hip abduction 1 4+  Hip adduction 2 4+  Hip internal rotation    Hip external rotation    Knee flexion 3 4+  Knee extension 2 4+  Ankle dorsiflexion 4- 4+  Ankle plantarflexion    Ankle inversion    Ankle eversion    (Blank rows = not tested)  BED MOBILITY:  Has a rail but requires assistance (up to max) to roll and perform sit <>supine   TRANSFERS: Assistive device utilized: Environmental consultant - 2 wheeled  Sit to stand: SBA Stand to sit: SBA Pt kickstands RLE and leans to L side to  perform    GAIT: Gait pattern: step to pattern, decreased step length- Right, decreased stride length, decreased hip/knee flexion- Right, decreased ankle dorsiflexion- Right, Left hip hike, lateral lean- Left, and poor foot clearance- Right Distance walked: Various clinic distances  Assistive device utilized: Walker - 2 wheeled Level of assistance: CGA and Min A Comments: Pt not wearing AFO this date. Noted pt dragging RLE on ground, especially w/fatigue, and would lose balance anteriorly requiring min A to stabilize. Pt also vaults to progress RLE and frequently tips RW over  on L side.    VITALS  Vitals:   12/22/22 1236  BP: (!) 146/74  Pulse: 94     TODAY'S TREATMENT:       TherAct Assessed vitals (see above) and systolic elevated but WNL for therapy   STG Assessment   [facility] PT Assessment - 12/22/22 1245       Ambulation/Gait   Gait velocity 32.8' over 46.22s = .71 ft/s   w/RW and SBA           Pt performed sit to supine on R side w/min A for RLE management. Performed unilateral supine butterfly stretch to R adductor x4 minutes w/added overpressure to pt tolerance. Pt did report relied w/stretch, so added to HEP (See bolded below). Pt performed supine >sit w/min A for trunk support.   Gait Training  Gait pattern:  ER of RLE, step through pattern, decreased stride length, decreased hip/knee flexion- Right, decreased ankle dorsiflexion- Right, lateral hip instability, lateral lean- Left, narrow BOS, and poor foot clearance- Right Distance walked: 115' Assistive device utilized: Quad cane small base and R AFO Level of assistance: CGA Comments: Trialed use of SBQC around gym. Pt initially placing cane too far anteriorly, resulting in anterior LOB, but was able to correct cane placement without cues from therapist. Pt moving slowly and had most difficulty w/turns, but no major LOB noted. RPE of 5-6/10 following activity.                                                                                                                PATIENT EDUCATION:  Education details: Additions to HEP  Person educated: Patient Education method: Explanation, Demonstration, Tactile cues, and Handouts Education comprehension: verbalized understanding, returned demonstration, and needs further education  HOME EXERCISE PROGRAM:  Access Code: [email or web address] URL: [email or web address] Date: 11/12/2022 Prepared by: Alethia Berthold Robecca Fulgham  Exercises - Supine Figure 4 Piriformis Stretch  - 1 x daily - 7 x weekly - 3 sets - 1-3 minute hold - Seated Piriformis Stretch  - 1 x daily - 7 x weekly - 3 sets - 1-3 minute hold - Unilateral Supported Supine Butterfly Stretch  - 1 x daily - 7 x weekly - 3 sets - 2 minute hold  GOALS: Goals reviewed with patient? Yes  SHORT TERM GOALS: Target date: 10/28/2022    Pt will perform initial HEP w/min A from caregiver for improved strength, balance, transfers and gait.  Baseline: to be established  Goal status: IN PROGRESS  2.  Pt will improve gait velocity to at least 0.7 ft/s w/RW and CGA for improved gait efficiency and independence   Baseline: 0.49 ft/s w/RW and min A; 0.66 ft/s w/RW and SBA Goal status: PARTIALLY MET   3.  Pt will be able to don and doff R hand orthotic on RW w/CGA for improved independence and weight shift to R side  Baseline: max A; min A; SBA (10/3)  Goal status: MET   4.  Pt will ambulate greater than or equal to 300 feet on  with LRAD and CGA for improved cardiovascular endurance and BLE strength.  Baseline: 213 ft with RW and min A (9/17); 195' w/RW and SBA  Goal status: PARTIALLY MET   5.  Berg to be assessed and LTG written  Baseline: 25/56 Goal status: MET  6.  Pt will perform supine > sit EOB w/min A or use of rail for improved independence and functional strength Baseline: mod-max A; min A (9/26) Goal status: MET  LONG TERM GOALS: Target date: 11/25/2022    Pt will be independent with final HEP  for improved strength, balance, transfers and gait.  Baseline: "going alright" Goal status: In Progress  2.  Pt will improve gait velocity to at least 1.0 ft/s w/LRAD and SBA for improved gait efficiency and independence   Baseline: 0.49 ft/s w/RW and min A; 0.66 ft/s w/RW and SBA, 0.60 ft/s w/ RW and close SBA (12/01/22) Goal status: NOT MET  3.  Pt will ambulate greater than or equal to 400 feet on with LRAD and CGA for improved cardiovascular endurance and BLE strength.   Baseline: 213 ft with RW and min A (9/17), 195' w/RW and SBA, 208 with RW and CGA (12/01/22) Goal status: NOT MET  4.  Pt will improve Berg score to 34/56 for decreased fall risk  Baseline: 25/56, 35/56 (12/01/22) Goal status: MET  5.  Pt will perform bed mobility at CGA or SBA level for improved independence at home  Baseline: mod-max A, Mod I Goal status: MET    RECERTIFICATION SHORT TERM GOALS=  12/22/2022   1.  Pt will improve gait velocity to at least 0.75 ft/s w/ LRAD and SBA for improved gait efficiency and independence. Baseline: 0.60 ft/s w/ RW and close SBA (12/01/22); 0.71 ft/s w/RW and SBA Goal status: PARTIALLY MET    RECERTIFICATION LONG TERM GOALS:  Target date: 01/12/2023   Pt will improve gait velocity to at least 1.0 ft/s w/ LRAD and SBA for improved gait efficiency and independence. Baseline: 0.60 ft/s w/ RW and close SBA (12/01/22) Goal status: INITIAL  2.  Pt will improve Berg score to 39/56 for decreased fall risk Baseline: 35/56 (12/01/22) Goal status: INITIAL  3.  Pt will ambulate greater than or equal to 400 feet on with LRAD and CGA for improved cardiovascular endurance and BLE strength.  Baseline: 208 FT with RW and CGA (12/01/22) Goal status: INITIAL  4.  Pt will safely transfer from the floor to standing with Mod A or less using proper body mechanics Baseline:  Goal status: INITIAL   ASSESSMENT:  CLINICAL IMPRESSION:  Emphasis of skilled PT session on  STG assessment and gait training w/SBQC. Pt has partially met his STG, improving his gait speed to 0.7 ft/s w/RW and R AFO with SBA, narrowly missing his goal of 0.75 ft/s. Pt reporting discomfort in R hip adductor this date, likely due to his adductor compensation during swing phase. Added adductor stretch to HEP to assist with this. Initiated gait training w/SBQC this date and pt performed well w/CGA but did fatigue quickly and demonstrated minor anterior instability. Continue POC.   OBJECTIVE IMPAIRMENTS: Abnormal gait, decreased activity tolerance, decreased balance, decreased coordination, decreased mobility, difficulty walking, decreased strength, impaired tone, impaired UE functional use, improper body mechanics, and pain.   ACTIVITY LIMITATIONS: carrying, lifting, standing, squatting, stairs, transfers, bed mobility, bathing, toileting, dressing, self feeding, reach over head, hygiene/grooming, locomotion level, and caring for others  PARTICIPATION LIMITATIONS: meal prep, cleaning, laundry, medication management,  personal finances, interpersonal relationship, driving, shopping, community activity, and yard work  PERSONAL FACTORS: Fitness, Past/current experiences, Transportation, and 1 comorbidity: CVA  are also affecting patient's functional outcome.   REHAB POTENTIAL: Good  CLINICAL DECISION MAKING: Evolving/moderate complexity  EVALUATION COMPLEXITY: Moderate  PLAN:  PT FREQUENCY: 2x/week  PT DURATION: 8 weeks (POC written for 10 weeks due to delay in scheduling) + 6 weeks (recert)  PLANNED INTERVENTIONS: Therapeutic exercises, Therapeutic activity, Neuromuscular re-education, Balance training, Gait training, Patient/Family education, Self Care, Joint mobilization, Stair training, Orthotic/Fit training, DME instructions, Aquatic Therapy, Dry Needling, Electrical stimulation, Manual therapy, and Re-evaluation  PLAN FOR NEXT SESSION: Gait w/SBQC, Continue TM training? Will need +2 if  doing tall kneel activities. Prone- decrease assist amount? continue with Bioness to R HS and ant tib; RLE NMR, weight shift to R side, work on bed mobility and donning/doffing RUE from hand orthotic and donning/doffing AFO more independently; can trial hemiwalker ambulation- sling for RUE?, stepping activities at parallel/ballet bars, pt wants to do stair activities (step ups/taps?)   Furqan Gosselin E Jisella Ashenfelter, PT, DPT 12/22/2022, 1:17 PM

## 2022-12-22 NOTE — Patient Instructions (Signed)
Access Code: [email or web address] URL: [email or web address] Date: 12/22/2022 Prepared by: Amada Kingfisher  Exercises - Seated Shoulder Flexion Towel Slide at Table Top  - 1 x daily - 10 reps - Seated Shoulder Abduction Towel Slide at Table Top  - 1 x daily - 10 reps - Seated Shoulder Single Arm Scaption Towel Slides at Counter  - 1 x daily - 10 reps - Supine Shoulder Flexion AAROM with Hands Clasped  - 2 x daily - 5 reps

## 2022-12-22 NOTE — Therapy (Unsigned)
OUTPATIENT OCCUPATIONAL THERAPY NEURO TREATMENT  Patient Name: Jimmy Valentine MRN: [id number] DOB:02-Oct-1950, 72 y.o., male Today's Date: 12/22/2022  PCP: Wanda Plump, MD REFERRING PROVIDER: Wanda Plump, MD  END OF SESSION:  OT End of Session - 12/22/22 1321     Visit Number 15    Number of Visits 32   + evaluaiton   Date for OT Re-Evaluation 02/05/23    Authorization Type [employer] MC 2024 Met VL: [place] Auth Not Reqd    OT Start Time 1320    OT Stop Time 1400    OT Time Calculation (min) 40 min    Equipment Utilized During Treatment cane for UE ROM    Activity Tolerance Patient tolerated treatment well    Behavior During Therapy WFL for tasks assessed/performed             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,had a MRI, was prescribed testosterone by endocrinology   Sleep apnea    does't use cpap   Sudden hearing loss, left 2017   idiopathic sensorineural hearing loss   Past Surgical History:  Procedure Laterality Date   TOTAL KNEE ARTHROPLASTY Right 2005   TOTAL KNEE ARTHROPLASTY  12/04/2011   LEFT-- TOTAL KNEE ARTHROPLASTY;  Surgeon: Eugenia Mcalpine, MD;  Location: [facility];  Service: Orthopedics;  Laterality: Left;   Patient Active Problem List   Diagnosis Date Noted   Hemiparesis affecting right side as late effect of cerebrovascular accident (CVA) (HCC) 09/04/2022   Gait disturbance, post-stroke 09/04/2022   Hyponatremia 07/18/2022   Aphasia due to acute cerebrovascular accident (CVA) (HCC) 07/15/2022   Acute ischemic left MCA stroke (HCC) 06/24/2022   Stroke (cerebrum) (HCC) 06/21/2022   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 10/18/2014   Elevated LFTs 04/13/2014   Diabetes (HCC) 09/03/2011   Gout 09/03/2011   OSA (obstructive sleep apnea) 04/22/2011   Annual physical exam 12/04/2010   Hypertension    Hyperlipidemia    Hypogonadism male    Erectile dysfunction     ONSET DATE: Referral date: 10/01/2022  Stroke 06/21/22  REFERRING DIAG: I69.351 (ICD-10-CM) - Hemiplegia and hemiparesis following cerebral infarction affecting right dominant side (HCC)  THERAPY DIAG:   Hemiplegia and hemiparesis following cerebral infarction affecting right dominant side (HCC)  Muscle weakness (generalized)  Other lack of coordination  Rationale for Evaluation and Treatment: Rehabilitation  SUBJECTIVE:   SUBJECTIVE STATEMENT:  Pt reports working on arm but confirms difficulty with shoulder ROM.  Pt accompanied by: self  PERTINENT HISTORY:  Presented to [facility] ED on 06/21/2022 with sudden onset of right-sided weakness, dysarthria and facial drooping. MRI showed large MCA territory basal ganglia infarct with petechial hemorrhage with no hemorrhagic transformation or mass effect.   Inpatient Rehab: 06/24/2022 - 07/30/2022 Patient discharged at [facility] Assist level.  Patient's care partners able to provide the necessary physical assistance at discharge.   Recommendation:  Patient will benefit from ongoing skilled OT services in home health setting to continue to advance functional skills in the area of BADL, iADL, and Reduce care partner burden. Equipment: Shower  chair, commode, RW, R RW hand splint, R resting hand splint, LH sponge, Dycem   Home Health therapies 07/24 - 08/24     PRECAUTIONS: Fall  WEIGHT BEARING RESTRICTIONS: No  PAIN:  Are you having pain? No   FALLS: Has patient fallen in last 6 months? Yes. Number of falls 1x - fell on his bottom about a month prior to evaluation - got the walker stuck on the doorframe  11/26/22 - had spasms in the AM and fell in PM trying to  sit down but fell forwards instead  LIVING ENVIRONMENT: Lives with: lives alone and Caregiver 3-4 days/week x 12 hours, daughter, son and brother-in-law assist on other days Lives in: House/apartment Stairs: Yes: External: 3-5 steps; can reach both Has following equipment at home: Environmental consultant - 2 wheeled, Wheelchair (manual), Shower bench, bed side commode, Grab bars, and urinal  PLOF: Independent - still driving, played golf, retired Immunologist with lots traveling (driving)  PATIENT GOALS: To get my right side back working again  OBJECTIVE:   HAND DOMINANCE: Right (affected side)  ADLs: Overall ADLs: Min to mod assistance Transfers/ambulation related to ADLs: Supervision Eating: Gets help to cut food as needed, having to use L hand Grooming: Used regular razor yesterday for the first time UB Dressing: Needs help to get R arm in sleeve LB Dressing: Can pull pants up most of the time Toileting: Most of the time he is able to perform on his own; uses urinal at bedside at night Bathing: "50/50" with caregiver Tub Shower transfers: Gets help to get his leg into the tub Equipment: Transfer tub bench, Grab bars, bed side commode, and Long handled sponge  IADLs: Shopping: caregiver and family (daughter) Light housekeeping: Caregiver Meal Prep: Psychologist, counselling mobility: RW with hand orthosis & dycem + strap for R hand Medication management: Assistance from caregivers Financial management: Assistance from family Handwriting: unable with R UE; not assessed with L UE  MOBILITY STATUS: Needs Assist: Help to get his R UE/hand on the handle of his walker and Hx of falls  POSTURE COMMENTS:  rounded shoulders and forward head Sitting balance:  WFL in arm chair at table  ACTIVITY TOLERANCE: Activity tolerance: Limited due to R hemiplegia  FUNCTIONAL OUTCOME MEASURES: PSFS - 1.0  12/03/22 PSFS = 4.7  UPPER EXTREMITY ROM:    Active ROM Right eval Left eval  Shoulder  flexion  [facility]  Shoulder abduction    Shoulder adduction    Shoulder extension min   Shoulder internal rotation    Shoulder external rotation    Elbow flexion min   Elbow extension    Wrist flexion min   Wrist extension    Wrist ulnar deviation    Wrist radial deviation    Wrist pronation    Wrist supination    (Blank rows = not tested)  UPPER EXTREMITY MMT:     MMT Right eval Left eval  Shoulder flexion  4/5  Shoulder abduction    Shoulder adduction    Shoulder extension    Shoulder internal rotation    Shoulder external rotation    Middle trapezius    Lower trapezius    Elbow flexion    Elbow extension    Wrist flexion    Wrist extension    Wrist ulnar deviation    Wrist radial deviation    Wrist pronation    Wrist supination    (Blank rows = not tested)  HAND FUNCTION: Grip strength: Right: 3.9, 3.7, 5.2  lbs; Left: 34.8, 36.5, 37.9 lbs Average: Right 4.3 lbs Left 36.4 lbs  12/03/22: Right 6.1, 6.6, 4.8  Left 38.3, 39.4, 38.8 Average: Right 5.8 lbs Left 38.8 lbs  COORDINATION: Unable to perform R UE coordination tasks,   SENSATION: WFL  EDEMA: Slight RUE  MUSCLE TONE: RUE: Hypotonic and Flaccid  COGNITION: Overall cognitive status: Within functional limits for tasks assessed and h/o more severe aphasia and does self-report need to think about what he is trying to say etc  VISION: Subjective report: Eye Dr ~ 3 weeks Baseline vision: Wears glasses for reading only Visual history: cataracts  VISION ASSESSMENT: TBA  Patient is not having difficulty with activities due to vision.  PERCEPTION: Not tested  PRAXIS: Not tested  OBSERVATIONS: Pt ambulated into clinic w/RW and R hand orthosis. Patient was wearing his AFO which he stated he stopped wearing after he finished home health therapy.   TODAY'S TREATMENT:                                                                                                                               Neuromuscular  Reed: Neuromuscular reeducation provided, including specific visual, tactile and verbal cues and physical assistance to stimulate the neuromuscular system and promote functional ROM, movement and use of RUE for HEP exercises per printed pt instructions for shoulder flexion activities due to difficulty with PROM of shoulder from seated position for dressing activities.  Focussed on R shoulder ROM with assistance of LUE during table slides for flexion, abduction (with elbow bent) and scaption while sitting beside the table as well as facing the table.    Also worked on R UE movements with L hand support over R hand atop cane in front of him ie) pushing and pulling cane back and forth.  He is encouraged to move the cane as much as possible with R UE movements with L hand for grip support as needed.  Pt assisted to work on active motion of R UE verus body and although he does need to use shoulder elevation to start motions, he is able to move arm slightly on his own. Reviewed and progress shoulder Exercises requiring NRE techniques including: - Seated Shoulder Flexion Towel Slide at Table Top  - Seated Shoulder Abduction Towel Slide at Table Top   - Seated Shoulder Single Arm Scaption Towel Slides at Counter   - Supine Shoulder Flexion AAROM with Hands Clasped   Manual Techniques: Pt required manual techniques to right shoulder to help with ROM due to tightness of musculature and resulting limitations in ROM.  Pt educated in how to help with manual massage and stretching of shoulder especially tightness along front of under arm to help maximize ROM.  PATIENT EDUCATION: Education details: RUE HEP and continued OT POC Person educated: Patient Education method: Explanation, Demonstration, Tactile cues, and Verbal cues Education comprehension: verbalized understanding, returned demonstration, verbal cues required, tactile cues required, and needs further education  HOME EXERCISE PROGRAM:  10/20/22 - UE ROM  (grip, forearm, elbow flex, shoulder shrugs) - no images provided only written suggestions. 10/27/2022: updated ROM HEP Access Code [email or web address] 11/03/22 - sleep positioning handout provided 12/16/22 - shoulder ROM Access Code: [email or web address]  12/22/22 - shoulder ROM Access Code: [email or web address]   GOALS: Goals reviewed with patient? Yes  SHORT TERM GOALS: Target date: 12/31/22  Patient will demonstrate updated RUE HEP with 25% verbal cues or less for proper execution.  Baseline: New to outpatient OT.  Self reported difficulty with motivation at home Goal status: MET 11/19/22 - Pt reported completing HEP. Pt demo'd understanding of exercises with approx. 40% v/c. 12/03/22 - splint 2x/day, stretching hand/wrist, shoulder elevations, squeezing ball   2.  Patient will demonstrate at least 8+ lbs RUE grip strength as needed to stabilize container on tabletop/hold large handled object.  Baseline: Right 4.3 lbs Goal status: IN progress 11/19/22 - Right: 5 lbs, with support from OT and pt's LUE for initial grasp positioning of RUE 12/03/22 - Average: Right 5.8 lbs Left 38.8 lbs  3.  Patient will be able to don T-shirt without physical assistance x 5 days in a row. Baseline: Caregiver assistance to get R UE in sleeve Goal status: IN Progress 11/19/22 - Pt dons t-shirt on LUE and head, then caregiver provides assistance for RUE in sleeve.  4.  Patient will improve R UE AROM for shoulder elevation and elbow flexion to lift arm/hand to walker.  Baseline: Caregiver assist or uses LUE.  Trace elbow/shoulder movement Goal status: IN Progress 11/19/22 - Pt reports "sometimes" placing RUE on walker without LUE assistance though positioning requires full body compensatory movements. Pt primarily continues to use LUE.  5.  Patient will be assisted to don/doff appropriate splints/positioning devices to minimize subluxation and minimize risk of RUE/hand contracture. Baseline: None place Goal status: MET  11/19/22 - Pt  reports wearing splint at least 2-3 hours per day, not currently wearing at night d/t discomfort.    LONG TERM GOALS: Target date: 01/28/23  Patient will demonstrate updated RUE HEP with visual schedule and images for proper execution and frequency. Baseline: New to outpatient OT.  Self reported difficulty with motivation at home. Goal status: IN Progress  2.  Patient will demonstrate at least 10 lbs RUE grip strength as needed to hold objects for manipulation by LUE. Baseline: Right 4.3 lbs Goal status: IN Progress 12/03/22 - Average: Right 5.8 lbs Left 38.8 lbs  3.  Patient will be able to get to EOB with no physical assistance to toilet with BSC as needed. Baseline: Assistance to get to EOB from caregivers. Goal status: MET 11/19/22 - Pt continues to use urinal in bed. Pt reports improved ability to ind transition to EOB and standing.   4.  Patient will be able to prepare simple food items with appropriate AE (small crock pot, microwave, toaster oven etc) Baseline: Caregiver/family perform meal prep  Goal status: IN Progress 11/12/22 - showed pt walker tray for home use  5.  Patient will report at least two-point increase in average PSFS score or at least three-point increase in a single activity score indicating functionally significant improvement given minimum detectable change. Baseline: 1.0 total score (See above for individual activity scores)  Goal status: MET and updated to continue to work towards additional 3 pt improvement s/p prog note 12/03/22 12/03/22 PSFS = 4.7   ASSESSMENT:  CLINICAL IMPRESSION: Patient is a 72 y.o. male who was seen today for occupational therapy  treatment s/p CVA 09/04/22 with below baseline level of function with ADLs and IADLS due to R hemiplegia.  He continues to work on PROM and AAROM as well as gravity eliminated movements to help with lifting arm up towards RW handle, using it a s a stabilizer and conducting functional neuro reed tasks with  repetition at home. Pt would benefit from skilled OT services in the outpatient setting to work on impairments as noted below to help pt return to PLOF as able.    PERFORMANCE DEFICITS: in functional skills including ADLs, coordination, dexterity, proprioception, sensation, tone, ROM, strength, flexibility, Fine motor control, Gross motor control, mobility, balance, endurance, decreased knowledge of use of DME, skin integrity, and UE functional use, cognitive skills including energy/drive, safety awareness, and sequencing, and psychosocial skills including coping strategies, environmental adaptation, and routines and behaviors.   IMPAIRMENTS: are limiting patient from ADLs, IADLs, rest and sleep, leisure, and social participation.   CO-MORBIDITIES: may have co-morbidities  that affects occupational performance. Patient will benefit from skilled OT to address above impairments and improve overall function.  REHAB POTENTIAL: Good   PLAN:  OT FREQUENCY: 1-2x/week  OT DURATION: additional 8 weeks  PLANNED INTERVENTIONS: self care/ADL training, therapeutic exercise, therapeutic activity, neuromuscular re-education, manual therapy, passive range of motion, balance training, functional mobility training, aquatic therapy, splinting, electrical stimulation, patient/family education, cognitive remediation/compensation, visual/perceptual remediation/compensation, energy conservation, coping strategies training, and DME and/or AE instructions  RECOMMENDED OTHER SERVICES: PT treatment in place  CONSULTED AND AGREED WITH PLAN OF CARE: Patient and family member/caregiver  PLAN FOR NEXT SESSION:   ADLS/IADLS - work in Warden/ranger and NRE for R UE AAROM (obtain personal unit?) Review and progress RUE treatment options Shoulder ROM, stability/weightbearing - hard to lift RUE to/above shoulder height - JB (combine all exercises to 1 access code) PRN splinting checks   Victorino Sparrow,  OT 12/22/2022, 4:51 PM

## 2022-12-24 ENCOUNTER — Ambulatory Visit: Payer: Medicare HMO | Admitting: Physical Therapy

## 2022-12-24 ENCOUNTER — Ambulatory Visit: Payer: Medicare HMO | Admitting: Occupational Therapy

## 2022-12-24 DIAGNOSIS — I69351 Hemiplegia and hemiparesis following cerebral infarction affecting right dominant side: Secondary | ICD-10-CM

## 2022-12-24 DIAGNOSIS — S43111D Subluxation of right acromioclavicular joint, subsequent encounter: Secondary | ICD-10-CM | POA: Diagnosis not present

## 2022-12-24 DIAGNOSIS — M6281 Muscle weakness (generalized): Secondary | ICD-10-CM

## 2022-12-24 DIAGNOSIS — R2681 Unsteadiness on feet: Secondary | ICD-10-CM | POA: Diagnosis not present

## 2022-12-24 DIAGNOSIS — R278 Other lack of coordination: Secondary | ICD-10-CM | POA: Diagnosis not present

## 2022-12-24 NOTE — Therapy (Signed)
OUTPATIENT OCCUPATIONAL THERAPY NEURO TREATMENT  Patient Name: Jimmy Valentine MRN: [id number] DOB:01/07/1951, 72 y.o., male Today's Date: 12/24/2022  PCP: Wanda Plump, MD REFERRING PROVIDER: Wanda Plump, MD  END OF SESSION:  OT End of Session - 12/24/22 1312     Visit Number 16    Number of Visits 32   + evaluaiton   Date for OT Re-Evaluation 02/05/23    Authorization Type [employer] MC 2024 Met VL: [place] Auth Not Reqd    OT Start Time 1102    OT Stop Time 1143    OT Time Calculation (min) 41 min    Activity Tolerance Patient tolerated treatment well    Behavior During Therapy WFL for tasks assessed/performed              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,had a MRI, was prescribed testosterone by endocrinology   Sleep apnea    does't use cpap   Sudden hearing loss, left 2017   idiopathic sensorineural hearing loss   Past Surgical History:  Procedure Laterality Date   TOTAL KNEE ARTHROPLASTY Right 2005   TOTAL KNEE ARTHROPLASTY  12/04/2011   LEFT-- TOTAL KNEE ARTHROPLASTY;  Surgeon: Eugenia Mcalpine, MD;  Location: [facility];  Service: Orthopedics;  Laterality: Left;   Patient Active Problem List   Diagnosis Date Noted   Hemiparesis affecting right side as late effect of cerebrovascular accident (CVA) (HCC) 09/04/2022   Gait disturbance, post-stroke 09/04/2022   Hyponatremia 07/18/2022   Aphasia due to acute cerebrovascular accident (CVA) (HCC) 07/15/2022   Acute ischemic left MCA stroke (HCC) 06/24/2022   Stroke (cerebrum) (HCC) 06/21/2022   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 10/18/2014   Elevated  LFTs 04/13/2014   Diabetes (HCC) 09/03/2011   Gout 09/03/2011   OSA (obstructive sleep apnea) 04/22/2011   Annual physical exam 12/04/2010   Hypertension    Hyperlipidemia    Hypogonadism male    Erectile dysfunction     ONSET DATE: Referral date: 10/01/2022  Stroke 06/21/22  REFERRING DIAG: I69.351 (ICD-10-CM) - Hemiplegia and hemiparesis following cerebral infarction affecting right dominant side (HCC)  THERAPY DIAG:   Hemiplegia and hemiparesis following cerebral infarction affecting right dominant side (HCC)  Muscle weakness (generalized)  Other lack of coordination  Rationale for Evaluation and Treatment: Rehabilitation  SUBJECTIVE:   SUBJECTIVE STATEMENT:  Pt reports R foot was experiencing spasms "all night long" after most recent therapy sessions on Tuesday. Pt reported difficulty walking yesterday (Wednesday) d/t sharp foot pain on top of foot though symptoms improved as the day went on. Pt reports continued soreness today (Thursday) though foot is "not as sore as it was." Pt reports spasms have happened before but not to that degree.  Pt accompanied by: self  PERTINENT HISTORY:  Presented to [facility] ED on 06/21/2022 with sudden onset of right-sided weakness, dysarthria and facial drooping. MRI showed large MCA territory basal ganglia infarct with petechial hemorrhage with no hemorrhagic transformation or mass effect.   Inpatient Rehab: 06/24/2022 - 07/30/2022 Patient discharged at [facility] Assist level.  Patient's care partners  able to provide the necessary physical assistance at discharge.   Recommendation:  Patient will benefit from ongoing skilled OT services in home health setting to continue to advance functional skills in the area of BADL, iADL, and Reduce care partner burden. Equipment: Shower chair, commode, RW, R RW hand splint, R resting hand splint, LH sponge, Dycem   Home Health therapies 07/24 - 08/24     PRECAUTIONS: Fall  WEIGHT BEARING RESTRICTIONS:  No  PAIN:  Are you having pain? Yes: NPRS scale: 0/10 at rest but 3/10 pain with movement/walking Pain location: R foot on top Pain description: sore Aggravating factors: walking Relieving factors: rest   FALLS: Has patient fallen in last 6 months? Yes. Number of falls 1x - fell on his bottom about a month prior to evaluation - got the walker stuck on the doorframe  11/26/22 - had spasms in the AM and fell in PM trying to sit down but fell forwards instead  LIVING ENVIRONMENT: Lives with: lives alone and Caregiver 3-4 days/week x 12 hours, daughter, son and brother-in-law assist on other days Lives in: House/apartment Stairs: Yes: External: 3-5 steps; can reach both Has following equipment at home: Environmental consultant - 2 wheeled, Wheelchair (manual), Shower bench, bed side commode, Grab bars, and urinal  PLOF: Independent - still driving, played golf, retired Immunologist with lots traveling (driving)  PATIENT GOALS: To get my right side back working again  OBJECTIVE:   HAND DOMINANCE: Right (affected side)  ADLs: Overall ADLs: Min to mod assistance Transfers/ambulation related to ADLs: Supervision Eating: Gets help to cut food as needed, having to use L hand Grooming: Used regular razor yesterday for the first time UB Dressing: Needs help to get R arm in sleeve LB Dressing: Can pull pants up most of the time Toileting: Most of the time he is able to perform on his own; uses urinal at bedside at night Bathing: "50/50" with caregiver Tub Shower transfers: Gets help to get his leg into the tub Equipment: Transfer tub bench, Grab bars, bed side commode, and Long handled sponge  IADLs: Shopping: caregiver and family (daughter) Light housekeeping: Caregiver Meal Prep: Psychologist, counselling mobility: RW with hand orthosis & dycem + strap for R hand Medication management: Assistance from caregivers Financial management: Assistance from family Handwriting: unable with R UE; not assessed  with L UE  MOBILITY STATUS: Needs Assist: Help to get his R UE/hand on the handle of his walker and Hx of falls  POSTURE COMMENTS:  rounded shoulders and forward head Sitting balance:  WFL in arm chair at table  ACTIVITY TOLERANCE: Activity tolerance: Limited due to R hemiplegia  FUNCTIONAL OUTCOME MEASURES: PSFS - 1.0  12/03/22 PSFS = 4.7  UPPER EXTREMITY ROM:    Active ROM Right eval Left eval  Shoulder flexion  [facility]  Shoulder abduction    Shoulder adduction    Shoulder extension min   Shoulder internal rotation    Shoulder external rotation    Elbow flexion min   Elbow extension    Wrist flexion min   Wrist extension    Wrist ulnar deviation    Wrist radial deviation    Wrist pronation    Wrist supination    (Blank rows = not tested)  UPPER EXTREMITY MMT:     MMT Right eval Left eval  Shoulder flexion  4/5  Shoulder abduction    Shoulder adduction    Shoulder extension    Shoulder internal rotation    Shoulder external rotation  Middle trapezius    Lower trapezius    Elbow flexion    Elbow extension    Wrist flexion    Wrist extension    Wrist ulnar deviation    Wrist radial deviation    Wrist pronation    Wrist supination    (Blank rows = not tested)  HAND FUNCTION: Grip strength: Right: 3.9, 3.7, 5.2 lbs; Left: 34.8, 36.5, 37.9 lbs Average: Right 4.3 lbs Left 36.4 lbs  12/03/22: Right 6.1, 6.6, 4.8  Left 38.3, 39.4, 38.8 Average: Right 5.8 lbs Left 38.8 lbs  COORDINATION: Unable to perform R UE coordination tasks,   SENSATION: WFL  EDEMA: Slight RUE  MUSCLE TONE: RUE: Hypotonic and Flaccid  COGNITION: Overall cognitive status: Within functional limits for tasks assessed and h/o more severe aphasia and does self-report need to think about what he is trying to say etc  VISION: Subjective report: Eye Dr ~ 3 weeks Baseline vision: Wears glasses for reading only Visual history: cataracts  VISION ASSESSMENT: TBA  Patient is not  having difficulty with activities due to vision.  PERCEPTION: Not tested  PRAXIS: Not tested  OBSERVATIONS: Pt ambulated into clinic w/RW and R hand orthosis. Patient was wearing his AFO which he stated he stopped wearing after he finished home health therapy.   TODAY'S TREATMENT:                                                                                                                               Neuromuscular Reed: E-stim NMES - 10 minutes timed - functional grasp and release of large pegs with OT placing peg in pt's fingers/composite fist - [place] small e-stim, custom unit - Pt tolerated ramp up to 20. Pt's skin intact before and after e-stim. - to improve neuromuscular re-education for functional RUE grasp/pinch, to increase RUE coordination, to increase RUE strengthening, to improve RUE proprioception.  Weightbearing through RUE - OT provided support at pt's R elbow and wrist - Reaching across midline to move beanbags - in order to increase RUE proprioception, increase muscle lengthening, prevent contractures. Pt reported "stretch" discomfort though denied pain throughout task.  PATIENT EDUCATION: Education details: e-stim purpose, purpose of activities, posture, weightbearing Person educated: Patient Education method: Explanation, Demonstration, Tactile cues, and Verbal cues Education comprehension: verbalized understanding, returned demonstration, verbal cues required, tactile cues required, and needs further education  HOME EXERCISE PROGRAM:  10/20/22 - UE ROM (grip, forearm, elbow flex, shoulder shrugs) - no images provided only written suggestions. 10/27/2022: updated ROM HEP Access Code [email or web address] 11/03/22 - sleep positioning handout provided 12/16/22 - shoulder ROM Access Code: [email or web address]  12/22/22 - shoulder ROM Access Code: [email or web address]   GOALS: Goals reviewed with patient? Yes  SHORT TERM GOALS: Target date: 12/31/22  Patient will demonstrate updated RUE HEP with 25%  verbal cues or less for proper execution.  Baseline: New to outpatient OT.  Self reported difficulty with motivation at home Goal status: MET 11/19/22 -  Pt reported completing HEP. Pt demo'd understanding of exercises with approx. 40% v/c. 12/03/22 - splint 2x/day, stretching hand/wrist, shoulder elevations, squeezing ball   2.  Patient will demonstrate at least 8+ lbs RUE grip strength as needed to stabilize container on tabletop/hold large handled object.  Baseline: Right 4.3 lbs Goal status: IN progress 11/19/22 - Right: 5 lbs, with support from OT and pt's LUE for initial grasp positioning of RUE 12/03/22 - Average: Right 5.8 lbs Left 38.8 lbs  3.  Patient will be able to don T-shirt without physical assistance x 5 days in a row. Baseline: Caregiver assistance to get R UE in sleeve Goal status: IN Progress 11/19/22 - Pt dons t-shirt on LUE and head, then caregiver provides assistance for RUE in sleeve.  4.  Patient will improve R UE AROM for shoulder elevation and elbow flexion to lift arm/hand to walker.  Baseline: Caregiver assist or uses LUE.  Trace elbow/shoulder movement Goal status: IN Progress 11/19/22 - Pt reports "sometimes" placing RUE on walker without LUE assistance though positioning requires full body compensatory movements. Pt primarily continues to use LUE.  5.  Patient will be assisted to don/doff appropriate splints/positioning devices to minimize subluxation and minimize risk of RUE/hand contracture. Baseline: None place Goal status: MET  11/19/22 - Pt reports wearing splint at least 2-3 hours per day, not currently wearing at night d/t discomfort.    LONG TERM GOALS: Target date: 01/28/23  Patient will demonstrate updated RUE HEP with visual schedule and images for proper execution and frequency. Baseline: New to outpatient OT.  Self reported difficulty with motivation at home. Goal status: IN Progress  2.  Patient will demonstrate at least 10 lbs RUE grip  strength as needed to hold objects for manipulation by LUE. Baseline: Right 4.3 lbs Goal status: IN Progress 12/03/22 - Average: Right 5.8 lbs Left 38.8 lbs  3.  Patient will be able to get to EOB with no physical assistance to toilet with BSC as needed. Baseline: Assistance to get to EOB from caregivers. Goal status: MET 11/19/22 - Pt continues to use urinal in bed. Pt reports improved ability to ind transition to EOB and standing.   4.  Patient will be able to prepare simple food items with appropriate AE (small crock pot, microwave, toaster oven etc) Baseline: Caregiver/family perform meal prep  Goal status: IN Progress 11/12/22 - showed pt walker tray for home use  5.  Patient will report at least two-point increase in average PSFS score or at least three-point increase in a single activity score indicating functionally significant improvement given minimum detectable change. Baseline: 1.0 total score (See above for individual activity scores)  Goal status: MET and updated to continue to work towards additional 3 pt improvement s/p prog note 12/03/22 12/03/22 PSFS = 4.7   ASSESSMENT:  CLINICAL IMPRESSION: Pt tolerated treatment well though continues to be limited by decreased AROM of RUE. Pt would benefit from skilled OT services in the outpatient setting to work on impairments as noted below to help pt return to PLOF as able.    PERFORMANCE DEFICITS: in functional skills including ADLs, coordination, dexterity, proprioception, sensation, tone, ROM, strength, flexibility, Fine motor control, Gross motor control, mobility, balance, endurance, decreased knowledge of use of DME, skin integrity, and UE functional use, cognitive skills including energy/drive, safety awareness, and sequencing, and psychosocial skills including coping strategies, environmental adaptation, and routines and behaviors.   IMPAIRMENTS: are limiting patient from ADLs, IADLs, rest and sleep, leisure, and  social  participation.   CO-MORBIDITIES: may have co-morbidities  that affects occupational performance. Patient will benefit from skilled OT to address above impairments and improve overall function.  REHAB POTENTIAL: Good   PLAN:  OT FREQUENCY: 1-2x/week  OT DURATION: additional 8 weeks  PLANNED INTERVENTIONS: self care/ADL training, therapeutic exercise, therapeutic activity, neuromuscular re-education, manual therapy, passive range of motion, balance training, functional mobility training, aquatic therapy, splinting, electrical stimulation, patient/family education, cognitive remediation/compensation, visual/perceptual remediation/compensation, energy conservation, coping strategies training, and DME and/or AE instructions  RECOMMENDED OTHER SERVICES: PT treatment in place  CONSULTED AND AGREED WITH PLAN OF CARE: Patient and family member/caregiver  PLAN FOR NEXT SESSION:   ADLS/IADLS - work in Warden/ranger and NRE for R UE AAROM (obtain personal unit?) Review and progress RUE treatment options Shoulder ROM, stability/weightbearing - hard to lift RUE to/above shoulder height - JB (combine all exercises to 1 access code) PRN splinting checks   Wynetta Emery, OT 12/24/2022, 1:18 PM

## 2022-12-24 NOTE — Therapy (Signed)
OUTPATIENT PHYSICAL THERAPY NEURO TREATMENT   Patient Name: Jimmy Valentine MRN: [id number] DOB:[birth year], 72 y.o., male Today's Date: 12/24/2022    PCP: Wanda Plump, MD REFERRING PROVIDER: Wanda Plump, MD    END OF SESSION:  PT End of Session - 12/24/22 1145     Visit Number 16    Number of Visits 25   recert   Date for PT Re-Evaluation 01/26/23   to allow for delays in scheduling   Authorization Type [employer]    PT Start Time 1145    PT Stop Time 1230    PT Time Calculation (min) 45 min    Equipment Utilized During Treatment Gait belt    Activity Tolerance Patient tolerated treatment well    Behavior During Therapy [facility] for tasks assessed/performed                 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,had a MRI, was prescribed testosterone by endocrinology   Sleep apnea    does't use cpap   Sudden hearing loss, left 2017   idiopathic sensorineural hearing loss   Past Surgical History:  Procedure Laterality Date   TOTAL KNEE ARTHROPLASTY Right 2005   TOTAL KNEE ARTHROPLASTY  12/04/2011   LEFT-- TOTAL KNEE ARTHROPLASTY;  Surgeon: Eugenia Mcalpine, MD;  Location: [facility];  Service: Orthopedics;  Laterality: Left;   Patient Active Problem List   Diagnosis Date Noted   Hemiparesis affecting right side as late effect of cerebrovascular accident (CVA) (HCC) 09/04/2022   Gait disturbance, post-stroke 09/04/2022   Hyponatremia 07/18/2022   Aphasia due to acute cerebrovascular accident (CVA) (HCC) 07/15/2022   Acute ischemic left MCA stroke (HCC) 06/24/2022   Stroke (cerebrum) (HCC) 06/21/2022   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 10/18/2014   Elevated LFTs 04/13/2014   Diabetes (HCC) 09/03/2011   Gout 09/03/2011   OSA (obstructive sleep apnea) 04/22/2011   Annual physical exam 12/04/2010   Hypertension    Hyperlipidemia    Hypogonadism male    Erectile dysfunction     ONSET DATE: 09/23/2022 (referral)   REFERRING DIAG: L87.564 (ICD-10-CM) - Hemiparesis affecting right side as late effect of cerebrovascular accident (CVA) (HCC)  THERAPY DIAG:  Hemiplegia and hemiparesis following cerebral infarction affecting right dominant side (HCC)  Muscle weakness (generalized)  Unsteadiness on feet  Other lack of coordination  Rationale for Evaluation and Treatment: Rehabilitation  SUBJECTIVE:  SUBJECTIVE STATEMENT:  Pt reports his right foot had bad spasms in it Tuesday night, making it difficult to sleep. On Wednesday, foot was very painful and difficult to walk on. Feeling a bit better today, rating pain as a 2/10. No falls.   Pt accompanied by:  Caregiver, [name] (in lobby)   PERTINENT HISTORY: L MCA CVA in May 2024   PAIN:  Are you having pain? Yes: NPRS scale: 2/10 Pain location: Right foot Pain description: achy  PRECAUTIONS: Fall  WEIGHT BEARING RESTRICTIONS: No  FALLS: Has patient fallen in last 6 months? Yes. Number of falls 1  LIVING ENVIRONMENT: Lives with: lives alone Lives in: House/apartment Stairs: Yes: External: 3 steps; on right going up, on left going up, and can reach both Has following equipment at home: Walker - 2 wheeled, Wheelchair (manual), shower chair, bed side commode, and AFO  PLOF: Independent  PATIENT GOALS: "Get back to where I can walk by myself without the walker and without assistance"   OBJECTIVE:   DIAGNOSTIC FINDINGS: MRI of brain  on 06/22/22  IMPRESSION: 1. Positive for Left MCA territory lacunar type infarct tracking from the left corona radiata to the lentiform. Petechial hemorrhage (Heidelberg classification 1b: HI2). No malignant hemorrhagic transformation. No significant mass effect.   2. No other acute intracranial abnormality. 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  CT angio of head/neck on 06/21/22 IMPRESSION: 1. 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 The left MCA bi/trifurcation and left M2 branches remain patent.   2. Superimposed Severe intracranial and cervical ICA Atherosclerosis resulting in: - distal Right Vertebral Artery (V4) OCCLUSION. - distal Left Vertebral Artery Severe (V4) stenosis. - Right ICA origin RADIOGRAPHIC STRING SIGN stenosis due to calcified plaque. - Left ICA origin stenosis due to complex plaque estimated 65-70%. - Severe Left ICA supraclinoid stenosis due to calcified plaque.  COGNITION: Overall cognitive status: Within functional limits for tasks assessed   SENSATION: Pt denies numbness/tingling in RLE/RUE   COORDINATION: Heel to shin test: WNL on LLE, unable to perform hip flexion on RLE but able to achieve cross-legged position    MUSCLE TONE: Mild clonus in R foot that fatigues after 3s   POSTURE: rounded shoulders and forward head   LOWER EXTREMITY MMT:  Tested in seated position   MMT Right Eval Left Eval  Hip flexion 2- 4+  Hip extension    Hip abduction 1 4+  Hip adduction 2 4+  Hip internal rotation    Hip external rotation    Knee flexion 3 4+  Knee extension 2 4+  Ankle dorsiflexion 4- 4+  Ankle plantarflexion    Ankle inversion    Ankle eversion    (Blank rows = not tested)  BED MOBILITY:  Has a rail  but requires assistance (up to max) to roll and perform sit <>supine   TRANSFERS: Assistive device utilized: Environmental consultant - 2 wheeled  Sit to stand: SBA Stand to sit: SBA Pt kickstands RLE and leans to L side to perform    VITALS  There were no vitals filed for this visit.    TODAY'S TREATMENT:       Ther Act  Removed pt's AFO and assessed R foot. Pt has clonus that fatigues after 5s and is more limited w/PF > DF ROM. Performed  anterior talar glide to R foot for 2 minutes. Pt denied pain w/glide.   Ther Ex  SciFit multi-peaks level 5 for 8 minutes using BLEs for cardiovascular warmup and BLE strength. Pt maintained abducted position of RLE but was able to perform ~75% ROM w/RLE. Min A to place RLE into/out of bike    NMR  In // bars, Adv/retreat over 4" beam w/LLE, x12 reps w/BUE support and min A for RUE management. Noted R knee hyperextending in stance and posterior lean to perform. Attempted x2 reps on RLE, but pt unable to lift leg  Side stepping in // bars, x3 reps down and back, for improved functional hip strength and step clearance. Min A for RUE management and anterior weight shift. Pt frequently rotating hips when stepping to R side to bias quads rather than hip abductors, requiring cues to keep toes pointed forward. In // bars, 5 Blaze pods on random reach setting for improved LE coordination, functional RLE strength and single leg stability.  Performed on 2 minute intervals with 5 minute rest periods.  Pt requires CGA guarding, min A for RUE management and LUE support on rail. Round 1:  lateral setup.  14 hits. Tapping pods w/RLE only  Round 2:  same setup.  16 hits. Tapping pods w/BLEs Notable errors/deficits:  With fatigue, pt has increased hyperextension of R knee and drags foot, requiring cues to ensure his foot is positioned properly prior to moving trunk, as pt frequently losing balance anteriorly.  Donned R AFO in // bars w/max A prior to pt ambulating out of session    Gait Training  Gait pattern:  ER of RLE, step through pattern, decreased stride length, decreased hip/knee flexion- Right, decreased ankle dorsiflexion- Right, lateral hip instability, lateral lean- Left, narrow BOS, and poor foot clearance- Right Distance walked: various clinic distances  Assistive device utilized: Walker - 2 wheeled and R AFO Level of assistance: CGA Comments: no catching of foot noted w/use of AFO                                                                                                              PATIENT EDUCATION:  Education details: Continue HEP, importance of stretching R foot after sessions  Person educated: Patient Education method: Explanation and Demonstration Education comprehension: verbalized understanding and needs further education  HOME EXERCISE PROGRAM:  Access Code: [email or web address] URL: [email or web address] Date: 11/12/2022 Prepared by: Alethia Berthold Decklan Mau  Exercises - Supine Figure 4 Piriformis Stretch  - 1 x daily - 7 x weekly - 3 sets - 1-3 minute hold - Seated Piriformis Stretch  - 1 x daily - 7 x weekly - 3 sets - 1-3 minute hold - Unilateral Supported Supine Butterfly Stretch  - 1 x daily - 7 x weekly - 3 sets - 2 minute hold  GOALS: Goals reviewed with patient? Yes  SHORT TERM GOALS: Target date: 10/28/2022    Pt will perform initial HEP w/min A from caregiver for improved strength, balance, transfers and gait.  Baseline: to be established  Goal status: IN PROGRESS  2.  Pt will improve gait velocity to at least 0.7 ft/s w/RW and CGA for improved gait efficiency and independence   Baseline: 0.49 ft/s w/RW and min A; 0.66 ft/s w/RW and SBA Goal status: PARTIALLY MET   3.  Pt will be able to don and doff R hand orthotic on RW w/CGA for improved independence and weight shift to R side  Baseline: max A; min A; SBA (10/3)  Goal status: MET   4.  Pt will ambulate greater than or equal to 300 feet on with LRAD and CGA  for improved cardiovascular endurance and BLE strength.  Baseline: 213 ft with RW and min A (9/17); 195' w/RW and SBA  Goal status: PARTIALLY MET   5.  Berg to be assessed and LTG written  Baseline: 25/56 Goal status: MET  6.  Pt will perform supine > sit EOB w/min A or use of rail for improved independence and functional strength Baseline: mod-max A; min A (9/26) Goal status: MET  LONG TERM GOALS: Target date: 11/25/2022    Pt will be independent with final HEP for improved strength, balance, transfers and gait.  Baseline: "going alright" Goal status: In Progress  2.  Pt will improve gait velocity to at least 1.0 ft/s w/LRAD and SBA for improved gait efficiency and independence   Baseline: 0.49 ft/s w/RW and min A; 0.66 ft/s w/RW and SBA, 0.60 ft/s w/ RW and close SBA (12/01/22) Goal status: NOT MET  3.  Pt will ambulate greater than or equal to 400 feet on with LRAD and CGA for improved cardiovascular endurance and BLE strength.   Baseline: 213 ft with RW and min A (9/17), 195' w/RW and SBA, 208 with RW and CGA (12/01/22) Goal status: NOT MET  4.  Pt will improve Berg score to 34/56 for decreased fall risk  Baseline: 25/56, 35/56 (12/01/22) Goal status: MET  5.  Pt will perform bed mobility at CGA or SBA level for improved independence at home  Baseline: mod-max A, Mod I Goal status: MET    RECERTIFICATION SHORT TERM GOALS=  12/22/2022   1.  Pt will improve gait velocity to at least 0.75 ft/s w/ LRAD and SBA for improved gait efficiency and independence. Baseline: 0.60 ft/s w/ RW and close SBA (12/01/22); 0.71 ft/s w/RW and SBA Goal status: PARTIALLY MET    RECERTIFICATION LONG TERM GOALS:  Target date: 01/12/2023   Pt will improve gait velocity to at least 1.0 ft/s w/ LRAD and SBA for improved gait efficiency and independence. Baseline: 0.60 ft/s w/ RW and close SBA (12/01/22) Goal status: INITIAL  2.  Pt will improve Berg score to 39/56 for decreased  fall risk Baseline: 35/56 (12/01/22) Goal status: INITIAL  3.  Pt will ambulate greater than or equal to 400 feet on with LRAD and CGA for improved cardiovascular endurance and BLE strength.  Baseline: 208 FT with RW and CGA (12/01/22) Goal status: INITIAL  4.  Pt will safely transfer from the floor to standing with Mod A or less using proper body mechanics Baseline:  Goal status: INITIAL   ASSESSMENT:  CLINICAL IMPRESSION:  Emphasis of skilled PT session on BLE strength, increased step clearance of RLE w/o AFO, single leg stability and LE coordination. Pt reports his R foot was very tight following last session, so performed gentle mobility to foot at beginning of session and encouraged pt to stretch following sessions. Entirety of session spent w/o  use of AFO, which was challenging for pt but he does demonstrate improved hip/knee flexion this date. With fatigue, increased genu recurvatum of RLE noted as well as anterior LOB. Continue POC.   OBJECTIVE IMPAIRMENTS: Abnormal gait, decreased activity tolerance, decreased balance, decreased coordination, decreased mobility, difficulty walking, decreased strength, impaired tone, impaired UE functional use, improper body mechanics, and pain.   ACTIVITY LIMITATIONS: carrying, lifting, standing, squatting, stairs, transfers, bed mobility, bathing, toileting, dressing, self feeding, reach over head, hygiene/grooming, locomotion level, and caring for others  PARTICIPATION LIMITATIONS: meal prep, cleaning, laundry, medication management, personal finances, interpersonal relationship, driving, shopping, community activity, and yard work  PERSONAL FACTORS: Fitness, Past/current experiences, Transportation, and 1 comorbidity: CVA  are also affecting patient's functional outcome.   REHAB POTENTIAL: Good  CLINICAL DECISION MAKING: Evolving/moderate complexity  EVALUATION COMPLEXITY: Moderate  PLAN:  PT FREQUENCY: 2x/week  PT DURATION: 8  weeks (POC written for 10 weeks due to delay in scheduling) + 6 weeks (recert)  PLANNED INTERVENTIONS: Therapeutic exercises, Therapeutic activity, Neuromuscular re-education, Balance training, Gait training, Patient/Family education, Self Care, Joint mobilization, Stair training, Orthotic/Fit training, DME instructions, Aquatic Therapy, Dry Needling, Electrical stimulation, Manual therapy, and Re-evaluation  PLAN FOR NEXT SESSION: Gait w/SBQC, Continue TM training? Will need +2 if doing tall kneel activities. Prone- decrease assist amount? continue with Bioness to R HS and ant tib; RLE NMR, weight shift to R side, work on bed mobility and donning/doffing RUE from hand orthotic and donning/doffing AFO more independently; can trial hemiwalker ambulation- sling for RUE?, stepping activities at parallel/ballet bars, pt wants to do stair activities (step ups/taps?)   Rain Wilhide E Samayra Hebel, PT, DPT 12/24/2022, 12:35 PM

## 2022-12-29 ENCOUNTER — Ambulatory Visit: Payer: Medicare HMO | Admitting: Physical Therapy

## 2022-12-29 ENCOUNTER — Ambulatory Visit: Payer: Medicare HMO | Admitting: Occupational Therapy

## 2022-12-29 VITALS — BP 132/81 | HR 96

## 2022-12-29 DIAGNOSIS — I69351 Hemiplegia and hemiparesis following cerebral infarction affecting right dominant side: Secondary | ICD-10-CM

## 2022-12-29 DIAGNOSIS — R278 Other lack of coordination: Secondary | ICD-10-CM

## 2022-12-29 DIAGNOSIS — R2681 Unsteadiness on feet: Secondary | ICD-10-CM | POA: Diagnosis not present

## 2022-12-29 DIAGNOSIS — S43111D Subluxation of right acromioclavicular joint, subsequent encounter: Secondary | ICD-10-CM | POA: Diagnosis not present

## 2022-12-29 DIAGNOSIS — M6281 Muscle weakness (generalized): Secondary | ICD-10-CM

## 2022-12-29 NOTE — Therapy (Unsigned)
OUTPATIENT OCCUPATIONAL THERAPY NEURO TREATMENT  Patient Name: Jimmy Valentine MRN: [id number] DOB:03-Nov-1950, 72 y.o., male Today's Date: 12/29/2022  PCP: Wanda Plump, MD REFERRING PROVIDER: Wanda Plump, MD  END OF SESSION:  OT End of Session - 12/29/22 1319     Visit Number 17    Number of Visits 32   + evaluaiton   Date for OT Re-Evaluation 02/05/23    Authorization Type [employer] MC 2024 Met VL: [place] Auth Not Reqd    OT Start Time 1317    OT Stop Time 1400    OT Time Calculation (min) 43 min    Activity Tolerance Patient tolerated treatment well    Behavior During Therapy WFL for tasks assessed/performed              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,had a MRI, was prescribed testosterone by endocrinology   Sleep apnea    does't use cpap   Sudden hearing loss, left 2017   idiopathic sensorineural hearing loss   Past Surgical History:  Procedure Laterality Date   TOTAL KNEE ARTHROPLASTY Right 2005   TOTAL KNEE ARTHROPLASTY  12/04/2011   LEFT-- TOTAL KNEE ARTHROPLASTY;  Surgeon: Eugenia Mcalpine, MD;  Location: [facility];  Service: Orthopedics;  Laterality: Left;   Patient Active Problem List   Diagnosis Date Noted   Hemiparesis affecting right side as late effect of cerebrovascular accident (CVA) (HCC) 09/04/2022   Gait disturbance, post-stroke 09/04/2022   Hyponatremia 07/18/2022   Aphasia due to acute cerebrovascular accident (CVA) (HCC) 07/15/2022   Acute ischemic left MCA stroke (HCC) 06/24/2022   Stroke (cerebrum) (HCC) 06/21/2022   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 10/18/2014   Elevated  LFTs 04/13/2014   Diabetes (HCC) 09/03/2011   Gout 09/03/2011   OSA (obstructive sleep apnea) 04/22/2011   Annual physical exam 12/04/2010   Hypertension    Hyperlipidemia    Hypogonadism male    Erectile dysfunction     ONSET DATE: Referral date: 10/01/2022  Stroke 06/21/22  REFERRING DIAG: I69.351 (ICD-10-CM) - Hemiplegia and hemiparesis following cerebral infarction affecting right dominant side (HCC)  THERAPY DIAG:   Muscle weakness (generalized)  Other lack of coordination  Hemiplegia and hemiparesis following cerebral infarction affecting right dominant side (HCC)  Rationale for Evaluation and Treatment: Rehabilitation  SUBJECTIVE:   SUBJECTIVE STATEMENT:  Pt reports he fell on his right side/elbow this past Friday when he was turning around in the kitchen after a walk.  His daughter and a neighbor had to help him get up but he reports being fine except for a bit of tenderness around the bruise on his R elbow.   Pt accompanied by: self  PERTINENT HISTORY:  Presented to [facility] ED on 06/21/2022 with sudden onset of right-sided weakness, dysarthria and facial drooping. MRI showed large MCA territory basal ganglia infarct with petechial hemorrhage with no hemorrhagic transformation or mass effect.   Inpatient Rehab: 06/24/2022 - 07/30/2022 Patient discharged at [facility] Assist level.  Patient's care partners able to provide the necessary physical assistance at discharge.   Recommendation:  Patient will benefit from ongoing skilled OT services in home health setting to continue to advance functional skills in the area of BADL, iADL, and Reduce care partner burden. Equipment: Shower chair, commode, RW, R RW hand splint, R resting hand splint, LH sponge, Dycem   Home Health therapies 07/24 - 08/24     PRECAUTIONS: Fall  WEIGHT BEARING RESTRICTIONS: No  PAIN:  Are you having pain? No R elbow tender to touch s/p fall   FALLS: Has patient fallen in last 6 months? Yes.  Number of falls 1x - fell on his bottom about a month prior to evaluation - got the walker stuck on the doorframe  11/26/22 - had spasms in the AM and fell in PM trying to sit down but fell forwards instead 12/25/22 - turning in the kitchen - fell to Right side and bumped elbow  LIVING ENVIRONMENT: Lives with: lives alone and Caregiver 3-4 days/week x 12 hours, daughter, son and brother-in-law assist on other days Lives in: House/apartment Stairs: Yes: External: 3-5 steps; can reach both Has following equipment at home: Environmental consultant - 2 wheeled, Wheelchair (manual), Shower bench, bed side commode, Grab bars, and urinal  PLOF: Independent - still driving, played golf, retired Immunologist with lots traveling (driving)  PATIENT GOALS: To get my right side back working again  OBJECTIVE:   HAND DOMINANCE: Right (affected side)  ADLs: Overall ADLs: Min to mod assistance Transfers/ambulation related to ADLs: Supervision Eating: Gets help to cut food as needed, having to use L hand Grooming: Used regular razor yesterday for the first time UB Dressing: Needs help to get R arm in sleeve LB Dressing: Can pull pants up most of the time Toileting: Most of the time he is able to perform on his own; uses urinal at bedside at night Bathing: "50/50" with caregiver Tub Shower transfers: Gets help to get his leg into the tub Equipment: Transfer tub bench, Grab bars, bed side commode, and Long handled sponge  IADLs: Shopping: caregiver and family (daughter) Light housekeeping: Caregiver Meal Prep: Psychologist, counselling mobility: RW with hand orthosis & dycem + strap for R hand Medication management: Assistance from caregivers Financial management: Assistance from family Handwriting: unable with R UE; not assessed with L UE  MOBILITY STATUS: Needs Assist: Help to get his R UE/hand on the handle of his walker and Hx of falls  POSTURE COMMENTS:  rounded shoulders and forward head Sitting balance:   WFL in arm chair at table  ACTIVITY TOLERANCE: Activity tolerance: Limited due to R hemiplegia  FUNCTIONAL OUTCOME MEASURES: PSFS - 1.0  12/03/22 PSFS = 4.7  UPPER EXTREMITY ROM:    Active ROM Right eval Left eval  Shoulder flexion  [facility]  Shoulder abduction    Shoulder adduction    Shoulder extension min   Shoulder internal rotation    Shoulder external rotation    Elbow flexion min   Elbow extension    Wrist flexion min   Wrist extension    Wrist ulnar deviation    Wrist radial deviation    Wrist pronation    Wrist supination    (Blank rows = not tested)  UPPER EXTREMITY MMT:     MMT Right eval Left eval  Shoulder flexion  4/5  Shoulder abduction    Shoulder adduction    Shoulder extension    Shoulder internal rotation    Shoulder external rotation    Middle trapezius    Lower trapezius    Elbow flexion  Elbow extension    Wrist flexion    Wrist extension    Wrist ulnar deviation    Wrist radial deviation    Wrist pronation    Wrist supination    (Blank rows = not tested)  HAND FUNCTION: Grip strength: Right: 3.9, 3.7, 5.2 lbs; Left: 34.8, 36.5, 37.9 lbs Average: Right 4.3 lbs Left 36.4 lbs  12/03/22: Right 6.1, 6.6, 4.8  Left 38.3, 39.4, 38.8 Average: Right 5.8 lbs Left 38.8 lbs  COORDINATION: Unable to perform R UE coordination tasks,   SENSATION: WFL  EDEMA: Slight RUE  MUSCLE TONE: RUE: Hypotonic and Flaccid  COGNITION: Overall cognitive status: Within functional limits for tasks assessed and h/o more severe aphasia and does self-report need to think about what he is trying to say etc  VISION: Subjective report: Eye Dr ~ 3 weeks Baseline vision: Wears glasses for reading only Visual history: cataracts  VISION ASSESSMENT: TBA  Patient is not having difficulty with activities due to vision.  PERCEPTION: Not tested  PRAXIS: Not tested  OBSERVATIONS: Pt ambulated into clinic w/RW and R hand orthosis. Patient was wearing his AFO  which he stated he stopped wearing after he finished home health therapy.   TODAY'S TREATMENT:                                                                                                                               Neuromuscular Reed: Neuromuscular reeducation provided, including specific visual, tactile and verbal cues and physical assistance to stimulate the neuromuscular system and promote functional ROM, movement and use of R UE for activities such as push/pull, elbow flexion/extension, grasp and release activities.  Pt engaged in working muscles from shoulder, to elbow, forearm, as well as wrist and digits.  Then motions were combined to move objects ie) grasp blocks and/or balls and then use forearm pronation to drop objects into a container held by L hand.  Constant physical guidance/support from OTR needed to R arm.  Pt able to initiate muscle twitches/movements of each joint and tires after a few motions. Self Care  Shown examples of simple meal prep idea/carryying objects on cookie sheet with walker.  Cookie sheet would be a good place to make a sandwich as the bread could be placed in a corner to minimize movement when putting condiments on the bread etc.  In addition a cookie sheet fit well between the handles of his walker as an option to move things around in his home. Dressing -- Reviewed option to help don LB clothing with education to dress R foot first, use small stool to prop his R foot and pt had success with getting resistance band loop over his R foot to simulate pants without physical assistance from OT.  Pt encouraged to practice small clothing items ie) undergarments or shorts, while using stool to minimize feeling like he is going to tip over as well as increase ease with getting items over the  right toe.  PATIENT EDUCATION: Education details: LB dressing sequence (R foot first), kitchen adaptations Person educated: Patient Education method: Explanation, Demonstration,  Tactile cues, and Verbal cues Education comprehension: verbalized understanding, returned demonstration, verbal cues required, tactile cues required, and needs further education  HOME EXERCISE PROGRAM:  10/20/22 - UE ROM (grip, forearm, elbow flex, shoulder shrugs) - no images provided only written suggestions. 10/27/2022: updated ROM HEP Access Code [email or web address] 11/03/22 - sleep positioning handout provided 12/16/22 - shoulder ROM Access Code: [email or web address]  12/22/22 - shoulder ROM Access Code: [email or web address]   GOALS: Goals reviewed with patient? Yes  SHORT TERM GOALS: Target date: 12/31/22  Patient will demonstrate updated RUE HEP with 25% verbal cues or less for proper execution.  Baseline: New to outpatient OT.  Self reported difficulty with motivation at home Goal status: MET 11/19/22 - Pt reported completing HEP. Pt demo'd understanding of exercises with approx. 40% v/c. 12/03/22 - splint 2x/day, stretching hand/wrist, shoulder elevations, squeezing ball   2.  Patient will demonstrate at least 8+ lbs RUE grip strength as needed to stabilize container on tabletop/hold large handled object.  Baseline: Right 4.3 lbs Goal status: IN progress 11/19/22 - Right: 5 lbs, with support from OT and pt's LUE for initial grasp positioning of RUE 12/03/22 - Average: Right 5.8 lbs Left 38.8 lbs  3.  Patient will be able to don T-shirt without physical assistance x 5 days in a row. Baseline: Caregiver assistance to get R UE in sleeve Goal status: IN Progress 11/19/22 - Pt dons t-shirt on LUE and head, then caregiver provides assistance for RUE in sleeve.  4.  Patient will improve R UE AROM for shoulder elevation and elbow flexion to lift arm/hand to walker.  Baseline: Caregiver assist or uses LUE.  Trace elbow/shoulder movement Goal status: IN Progress 11/19/22 - Pt reports "sometimes" placing RUE on walker without LUE assistance though positioning requires full body compensatory movements. Pt primarily  continues to use LUE.  5.  Patient will be assisted to don/doff appropriate splints/positioning devices to minimize subluxation and minimize risk of RUE/hand contracture. Baseline: None place Goal status: MET  11/19/22 - Pt reports wearing splint at least 2-3 hours per day, not currently wearing at night d/t discomfort.    LONG TERM GOALS: Target date: 01/28/23  Patient will demonstrate updated RUE HEP with visual schedule and images for proper execution and frequency. Baseline: New to outpatient OT.  Self reported difficulty with motivation at home. Goal status: IN Progress  2.  Patient will demonstrate at least 10 lbs RUE grip strength as needed to hold objects for manipulation by LUE. Baseline: Right 4.3 lbs Goal status: IN Progress 12/03/22 - Average: Right 5.8 lbs Left 38.8 lbs  3.  Patient will be able to get to EOB with no physical assistance to toilet with BSC as needed. Baseline: Assistance to get to EOB from caregivers. Goal status: MET 11/19/22 - Pt continues to use urinal in bed. Pt reports improved ability to ind transition to EOB and standing.   4.  Patient will be able to prepare simple food items with appropriate AE (small crock pot, microwave, toaster oven etc) Baseline: Caregiver/family perform meal prep  Goal status: IN Progress 11/12/22 - showed pt walker tray for home use  5.  Patient will report at least two-point increase in average PSFS score or at least three-point increase in a single activity score indicating functionally significant improvement given minimum detectable change. Baseline: 1.0 total score (See above for individual  activity scores)  Goal status: MET and updated to continue to work towards additional 3 pt improvement s/p prog note  12/03/22 PSFS = 4.7   ASSESSMENT:  CLINICAL IMPRESSION: Pt continues to tolerate treatment well though remains limited by decreased AROM of RUE/LE. Pt may benefit form a home NMES program for daily  neuro-stimulation of muscles as he has the ability to initiate movement in most joints but motions are small and limited in repetitions.  Pt continues to benefit from skilled OT services in the outpatient setting to work on impairments as noted at eval and compensate for R hemiplegia to help pt return to max independence with ADLs as able.    PERFORMANCE DEFICITS: in functional skills including ADLs, coordination, dexterity, proprioception, sensation, tone, ROM, strength, flexibility, Fine motor control, Gross motor control, mobility, balance, endurance, decreased knowledge of use of DME, skin integrity, and UE functional use, cognitive skills including energy/drive, safety awareness, and sequencing, and psychosocial skills including coping strategies, environmental adaptation, and routines and behaviors.   IMPAIRMENTS: are limiting patient from ADLs, IADLs, rest and sleep, leisure, and social participation.   CO-MORBIDITIES: may have co-morbidities  that affects occupational performance. Patient will benefit from skilled OT to address above impairments and improve overall function.  REHAB POTENTIAL: Good   PLAN:  OT FREQUENCY: 1-2x/week  OT DURATION: additional 8 weeks  PLANNED INTERVENTIONS: self care/ADL training, therapeutic exercise, therapeutic activity, neuromuscular re-education, manual therapy, passive range of motion, balance training, functional mobility training, aquatic therapy, splinting, electrical stimulation, patient/family education, cognitive remediation/compensation, visual/perceptual remediation/compensation, energy conservation, coping strategies training, and DME and/or AE instructions  RECOMMENDED OTHER SERVICES: PT treatment in place  CONSULTED AND AGREED WITH PLAN OF CARE: Patient and family member/caregiver  PLAN FOR NEXT SESSION:   ADLS/IADLS - work in Water engineer home E-stim protocols and NRE for R UE AAROM (obtain personal unit?) Review and  progress RUE treatment options Shoulder ROM, stability/weightbearing - hard to lift RUE to/above shoulder height - JB (combine all exercises to 1 access code) PRN splinting checks   Victorino Sparrow, OT 12/29/2022, 4:15 PM

## 2022-12-29 NOTE — Therapy (Signed)
OUTPATIENT PHYSICAL THERAPY NEURO TREATMENT   Patient Name: Jimmy Valentine MRN: [id number] DOB:1951/01/26, 72 y.o., male Today's Date: 12/29/2022    PCP: Wanda Plump, MD REFERRING PROVIDER: Wanda Plump, MD    END OF SESSION:  PT End of Session - 12/29/22 1236     Visit Number 17    Number of Visits 25   recert   Date for PT Re-Evaluation 01/26/23   to allow for delays in scheduling   Authorization Type [employer]    PT Start Time 1233    PT Stop Time 1317    PT Time Calculation (min) 44 min    Equipment Utilized During Treatment Gait belt    Activity Tolerance Patient tolerated treatment well    Behavior During Therapy [facility] for tasks assessed/performed                  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,had a MRI, was prescribed testosterone by endocrinology   Sleep apnea    does't use cpap   Sudden hearing loss, left 2017   idiopathic sensorineural hearing loss   Past Surgical History:  Procedure Laterality Date   TOTAL KNEE ARTHROPLASTY Right 2005   TOTAL KNEE ARTHROPLASTY  12/04/2011   LEFT-- TOTAL KNEE ARTHROPLASTY;  Surgeon: Eugenia Mcalpine, MD;  Location: [facility];  Service: Orthopedics;  Laterality: Left;   Patient Active Problem List   Diagnosis Date Noted   Hemiparesis affecting right side as late effect of cerebrovascular accident (CVA) (HCC) 09/04/2022   Gait disturbance, post-stroke 09/04/2022   Hyponatremia 07/18/2022   Aphasia due to acute cerebrovascular accident (CVA) (HCC) 07/15/2022   Acute ischemic left MCA stroke (HCC) 06/24/2022   Stroke (cerebrum) (HCC) 06/21/2022   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 10/18/2014   Elevated LFTs 04/13/2014   Diabetes (HCC) 09/03/2011   Gout 09/03/2011   OSA (obstructive sleep apnea) 04/22/2011   Annual physical exam 12/04/2010   Hypertension    Hyperlipidemia    Hypogonadism male    Erectile dysfunction     ONSET DATE: 09/23/2022 (referral)   REFERRING DIAG: W09.811 (ICD-10-CM) - Hemiparesis affecting right side as late effect of cerebrovascular accident (CVA) (HCC)  THERAPY DIAG:  Hemiplegia and hemiparesis following cerebral infarction affecting right dominant side (HCC)  Muscle weakness (generalized)  Unsteadiness on feet  Rationale for Evaluation and Treatment: Rehabilitation  SUBJECTIVE:  SUBJECTIVE STATEMENT:  Pt reports he had a fall on Friday, was in the kitchen and turned too quickly and fell onto his R side. Neighbor came over to help him up. States his elbow is sore but denies pain.   Pt accompanied by:  Caregiver, [name] (in lobby)   PERTINENT HISTORY: L MCA CVA in May 2024   PAIN:  Are you having pain? No  PRECAUTIONS: Fall  WEIGHT BEARING RESTRICTIONS: No  FALLS: Has patient fallen in last 6 months? Yes. Number of falls 1  LIVING ENVIRONMENT: Lives with: lives alone Lives in: House/apartment Stairs: Yes: External: 3 steps; on right going up, on left going up, and can reach both Has following equipment at home: Walker - 2 wheeled, Wheelchair (manual), shower chair, bed side commode, and AFO  PLOF: Independent  PATIENT GOALS: "Get back to where I can walk by myself without the walker and without assistance"   OBJECTIVE:   DIAGNOSTIC FINDINGS: MRI of brain on 06/22/22  IMPRESSION: 1. Positive for Left MCA territory lacunar type infarct tracking from the left corona radiata to the  lentiform. Petechial hemorrhage (Heidelberg classification 1b: HI2). No malignant hemorrhagic transformation. No significant mass effect.   2. No other acute intracranial abnormality. 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  CT angio of head/neck on 06/21/22 IMPRESSION: 1. 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 The left MCA bi/trifurcation and left M2 branches remain patent.   2. Superimposed Severe intracranial and cervical ICA Atherosclerosis resulting in: - distal Right Vertebral Artery (V4) OCCLUSION. - distal Left Vertebral Artery Severe (V4) stenosis. - Right ICA origin RADIOGRAPHIC STRING SIGN stenosis due to calcified plaque. - Left ICA origin stenosis due to complex plaque estimated 65-70%. - Severe Left ICA supraclinoid stenosis due to calcified plaque.  COGNITION: Overall cognitive status: Within functional limits for tasks assessed   SENSATION: Pt denies numbness/tingling in RLE/RUE   COORDINATION: Heel to shin test: WNL on LLE, unable to perform hip flexion on RLE but able to achieve cross-legged position    MUSCLE TONE: Mild clonus in R foot that fatigues after 3s   POSTURE: rounded shoulders and forward head   LOWER EXTREMITY MMT:  Tested in seated position   MMT Right Eval Left Eval  Hip flexion 2- 4+  Hip extension    Hip abduction 1 4+  Hip adduction 2 4+  Hip internal rotation    Hip external rotation    Knee flexion 3 4+  Knee extension 2 4+  Ankle dorsiflexion 4- 4+  Ankle plantarflexion    Ankle inversion    Ankle eversion    (Blank rows = not tested)  BED MOBILITY:  Has a rail but requires assistance (up to max) to roll and perform sit <>supine   TRANSFERS: Assistive device utilized: Environmental consultant - 2 wheeled   Sit to stand: SBA Stand to sit: SBA Pt kickstands RLE and leans to L side to perform    VITALS  Vitals:   12/29/22 1242  BP: 132/81  Pulse: 96      TODAY'S TREATMENT:       Ther Act  Assessed pt's RUE and R hip for bruising. No bruising or discoloration noted. Did note minor swelling along olecranon that was TTP.  Assessed vitals (see above) and WNL  Ther Ex  SciFit multi-peaks level 7.5 for 8 minutes using BLEs for cardiovascular warmup and BLE strength. Pt maintained abducted position of RLE throughout. Min A to place RLE into/out of bike.   NMR  6 Blaze pods on random reach setting for improved turns, LE coordination and single leg stability.  Performed on 3 minute minute intervals with 3 minute rest periods.  Pt requires CGA guarding and LUE support. Round 1:  circle setup in // bars.  23 hits. Round 2:  same setup.  19 hits. Notable errors/deficits:  Min cues to avoid pivoting on RLE and pick up foot instead. Pt tends to turn to L and leave RLE in ER position, so cued to move both feet. RPE of 6-7/10 following activity.     Gait Training  Gait pattern:  ER of RLE, step through pattern, decreased stride length, decreased hip/knee flexion- Right, decreased ankle dorsiflexion- Right, lateral hip instability, lateral lean- Left, narrow BOS, and poor foot clearance- Right Distance walked: various clinic distances  Assistive device utilized: Walker - 2 wheeled and R AFO Level of assistance: CGA Comments: no catching of foot noted w/use of AFO                                                                                                              PATIENT EDUCATION:  Education details: Continue HEP, prioritizing R foot position when performing transfers and turns  Person educated: Patient Education method: Medical illustrator Education comprehension: verbalized understanding and needs further education  HOME EXERCISE PROGRAM:  Access Code: [email or web address] URL:  [email or web address] Date: 11/12/2022 Prepared by: Alethia Berthold Mutasim Tuckey  Exercises - Supine Figure 4 Piriformis Stretch  - 1 x daily - 7 x weekly - 3 sets - 1-3 minute hold - Seated Piriformis Stretch  - 1 x daily - 7 x weekly - 3 sets - 1-3 minute hold - Unilateral Supported Supine Butterfly Stretch  - 1 x daily - 7 x weekly - 3 sets - 2 minute hold  GOALS: Goals reviewed with patient? Yes  SHORT TERM GOALS: Target date: 10/28/2022    Pt will perform initial HEP w/min A from caregiver for improved strength, balance, transfers and gait.  Baseline: to be established  Goal status: IN PROGRESS  2.  Pt will improve gait velocity to at least 0.7 ft/s w/RW and CGA for improved gait efficiency and independence   Baseline: 0.49 ft/s w/RW and min A; 0.66 ft/s w/RW and SBA Goal status: PARTIALLY MET   3.  Pt will be able to don and doff R hand orthotic on RW w/CGA for improved independence and weight shift to R side  Baseline: max A; min A; SBA (10/3)  Goal status: MET   4.  Pt will ambulate greater than or equal to 300 feet on with LRAD and CGA for improved cardiovascular endurance and BLE strength.  Baseline: 213 ft with RW and min A (9/17); 195' w/RW and SBA  Goal status: PARTIALLY MET   5.  Berg to be assessed  and LTG written  Baseline: 25/56 Goal status: MET  6.  Pt will perform supine > sit EOB w/min A or use of rail for improved independence and functional strength Baseline: mod-max A; min A (9/26) Goal status: MET  LONG TERM GOALS: Target date: 11/25/2022    Pt will be independent with final HEP for improved strength, balance, transfers and gait.  Baseline: "going alright" Goal status: In Progress  2.  Pt will improve gait velocity to at least 1.0 ft/s w/LRAD and SBA for improved gait efficiency and independence   Baseline: 0.49 ft/s w/RW and min A; 0.66 ft/s w/RW and SBA, 0.60 ft/s w/ RW and close SBA (12/01/22) Goal status: NOT MET  3.  Pt will  ambulate greater than or equal to 400 feet on with LRAD and CGA for improved cardiovascular endurance and BLE strength.   Baseline: 213 ft with RW and min A (9/17), 195' w/RW and SBA, 208 with RW and CGA (12/01/22) Goal status: NOT MET  4.  Pt will improve Berg score to 34/56 for decreased fall risk  Baseline: 25/56, 35/56 (12/01/22) Goal status: MET  5.  Pt will perform bed mobility at CGA or SBA level for improved independence at home  Baseline: mod-max A, Mod I Goal status: MET    RECERTIFICATION SHORT TERM GOALS=  12/22/2022   1.  Pt will improve gait velocity to at least 0.75 ft/s w/ LRAD and SBA for improved gait efficiency and independence. Baseline: 0.60 ft/s w/ RW and close SBA (12/01/22); 0.71 ft/s w/RW and SBA Goal status: PARTIALLY MET    RECERTIFICATION LONG TERM GOALS:  Target date: 01/12/2023   Pt will improve gait velocity to at least 1.0 ft/s w/ LRAD and SBA for improved gait efficiency and independence. Baseline: 0.60 ft/s w/ RW and close SBA (12/01/22) Goal status: INITIAL  2.  Pt will improve Berg score to 39/56 for decreased fall risk Baseline: 35/56 (12/01/22) Goal status: INITIAL  3.  Pt will ambulate greater than or equal to 400 feet on with LRAD and CGA for improved cardiovascular endurance and BLE strength.  Baseline: 208 FT with RW and CGA (12/01/22) Goal status: INITIAL  4.  Pt will safely transfer from the floor to standing with Mod A or less using proper body mechanics Baseline:  Goal status: INITIAL   ASSESSMENT:  CLINICAL IMPRESSION:  Emphasis of skilled PT session on endurance, LE coordination, turns and single leg stability. Pt has had one fall since last session, occurred during a turn. Noted pt pivoting on R foot rather than picking it up while turning, resulting in either very narrow BOS or extreme ER position of RLE. When cued to pick up R foot w/turns, pt able to perform more safely. Continue POC.   OBJECTIVE IMPAIRMENTS:  Abnormal gait, decreased activity tolerance, decreased balance, decreased coordination, decreased mobility, difficulty walking, decreased strength, impaired tone, impaired UE functional use, improper body mechanics, and pain.   ACTIVITY LIMITATIONS: carrying, lifting, standing, squatting, stairs, transfers, bed mobility, bathing, toileting, dressing, self feeding, reach over head, hygiene/grooming, locomotion level, and caring for others  PARTICIPATION LIMITATIONS: meal prep, cleaning, laundry, medication management, personal finances, interpersonal relationship, driving, shopping, community activity, and yard work  PERSONAL FACTORS: Fitness, Past/current experiences, Transportation, and 1 comorbidity: CVA  are also affecting patient's functional outcome.   REHAB POTENTIAL: Good  CLINICAL DECISION MAKING: Evolving/moderate complexity  EVALUATION COMPLEXITY: Moderate  PLAN:  PT FREQUENCY: 2x/week  PT DURATION: 8 weeks (POC written for  10 weeks due to delay in scheduling) + 6 weeks (recert)  PLANNED INTERVENTIONS: Therapeutic exercises, Therapeutic activity, Neuromuscular re-education, Balance training, Gait training, Patient/Family education, Self Care, Joint mobilization, Stair training, Orthotic/Fit training, DME instructions, Aquatic Therapy, Dry Needling, Electrical stimulation, Manual therapy, and Re-evaluation  PLAN FOR NEXT SESSION: Gait w/SBQC, Continue TM training? Will need +2 if doing tall kneel activities. Prone- decrease assist amount? continue with Bioness to R HS and ant tib; RLE NMR, weight shift to R side, work on bed mobility and donning/doffing RUE from hand orthotic and donning/doffing AFO more independently; can trial hemiwalker ambulation- sling for RUE?, stepping activities at parallel/ballet bars, pt wants to do stair activities (step ups/taps?)   Chelbi Herber E Chaselynn Kepple, PT, DPT 12/29/2022, 1:32 PM

## 2023-01-05 ENCOUNTER — Encounter: Payer: Self-pay | Admitting: Physical Therapy

## 2023-01-05 ENCOUNTER — Ambulatory Visit: Payer: Medicare HMO | Attending: Internal Medicine | Admitting: Physical Therapy

## 2023-01-05 ENCOUNTER — Ambulatory Visit: Payer: Medicare HMO | Admitting: Occupational Therapy

## 2023-01-05 DIAGNOSIS — M6281 Muscle weakness (generalized): Secondary | ICD-10-CM | POA: Diagnosis not present

## 2023-01-05 DIAGNOSIS — I69351 Hemiplegia and hemiparesis following cerebral infarction affecting right dominant side: Secondary | ICD-10-CM | POA: Diagnosis not present

## 2023-01-05 DIAGNOSIS — R2689 Other abnormalities of gait and mobility: Secondary | ICD-10-CM | POA: Diagnosis not present

## 2023-01-05 DIAGNOSIS — R2681 Unsteadiness on feet: Secondary | ICD-10-CM | POA: Diagnosis not present

## 2023-01-05 DIAGNOSIS — S43111D Subluxation of right acromioclavicular joint, subsequent encounter: Secondary | ICD-10-CM | POA: Insufficient documentation

## 2023-01-05 DIAGNOSIS — R29818 Other symptoms and signs involving the nervous system: Secondary | ICD-10-CM | POA: Diagnosis not present

## 2023-01-05 DIAGNOSIS — R278 Other lack of coordination: Secondary | ICD-10-CM

## 2023-01-05 NOTE — Therapy (Signed)
OUTPATIENT OCCUPATIONAL THERAPY NEURO TREATMENT  Patient Name: Jimmy Valentine MRN: [id number] DOB:[birth year], 72 y.o., male Today's Date: 01/05/2023  PCP: Wanda Plump, MD REFERRING PROVIDER: Wanda Plump, MD  END OF SESSION:  OT End of Session - 01/05/23 1102     Visit Number 18    Number of Visits 32   + evaluaiton   Date for OT Re-Evaluation 02/05/23    Authorization Type [employer] MC 2024 Met VL: [place] Auth Not Reqd    OT Start Time 1102    OT Stop Time 1145    OT Time Calculation (min) 43 min    Equipment Utilized During Treatment UBE    Activity Tolerance Patient tolerated treatment well    Behavior During Therapy WFL for tasks assessed/performed              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,had a MRI, was prescribed testosterone by endocrinology   Sleep apnea    does't use cpap   Sudden hearing loss, left 2017   idiopathic sensorineural hearing loss   Past Surgical History:  Procedure Laterality Date   TOTAL KNEE ARTHROPLASTY Right 2005   TOTAL KNEE ARTHROPLASTY  12/04/2011   LEFT-- TOTAL KNEE ARTHROPLASTY;  Surgeon: Eugenia Mcalpine, MD;  Location: [facility];  Service: Orthopedics;  Laterality: Left;   Patient Active Problem List   Diagnosis Date Noted   Hemiparesis affecting right side as late effect of cerebrovascular accident (CVA) (HCC) 09/04/2022   Gait disturbance, post-stroke 09/04/2022   Hyponatremia 07/18/2022   Aphasia due to acute cerebrovascular accident (CVA) (HCC) 07/15/2022   Acute ischemic left MCA stroke (HCC) 06/24/2022   Stroke (cerebrum) (HCC) 06/21/2022   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 10/18/2014   Elevated LFTs 04/13/2014   Diabetes (HCC) 09/03/2011   Gout 09/03/2011   OSA (obstructive sleep apnea) 04/22/2011   Annual physical exam 12/04/2010   Hypertension    Hyperlipidemia    Hypogonadism male    Erectile dysfunction     ONSET DATE: Referral date: 10/01/2022  Stroke 06/21/22  REFERRING DIAG: I69.351 (ICD-10-CM) - Hemiplegia and hemiparesis following cerebral infarction affecting right dominant side (HCC)  THERAPY DIAG:   Hemiplegia and hemiparesis following cerebral infarction affecting right dominant side (HCC)  Muscle weakness (generalized)  Other lack of coordination  Rationale for Evaluation and Treatment: Rehabilitation  SUBJECTIVE:   SUBJECTIVE STATEMENT:  Pt reports the dycem on his walker handle is not sticky anymore and asked for help to fix it.     Pt reports he wears his splint a couple of times/day but not at night due to discomfort   Pt accompanied by: self  PERTINENT HISTORY:  Presented to [facility] ED on 06/21/2022 with sudden onset of right-sided weakness, dysarthria and facial drooping. MRI showed large MCA territory basal ganglia infarct with petechial hemorrhage with no hemorrhagic transformation or mass effect.   Inpatient Rehab: 06/24/2022 - 07/30/2022 Patient discharged at [facility] Assist level.  Patient's care partners able to provide the necessary physical assistance at discharge.   Recommendation:  Patient will benefit  from ongoing skilled OT services in home health setting to continue to advance functional skills in the area of BADL, iADL, and Reduce care partner burden. Equipment: Shower chair, commode, RW, R RW hand splint, R resting hand splint, LH sponge, Dycem   Home Health therapies 07/24 - 08/24     PRECAUTIONS: Fall  WEIGHT BEARING RESTRICTIONS: No  PAIN:  Are you having pain? No R elbow tender to touch s/p fall   FALLS: Has patient fallen in last 6 months? Yes. Number of falls 1x - fell on  his bottom about a month prior to evaluation - got the walker stuck on the doorframe  11/26/22 - had spasms in the AM and fell in PM trying to sit down but fell forwards instead 12/25/22 - turning in the kitchen - fell to Right side and bumped elbow  LIVING ENVIRONMENT: Lives with: lives alone and Caregiver 3-4 days/week x 12 hours, daughter, son and brother-in-law assist on other days Lives in: House/apartment Stairs: Yes: External: 3-5 steps; can reach both Has following equipment at home: Environmental consultant - 2 wheeled, Wheelchair (manual), Shower bench, bed side commode, Grab bars, and urinal  PLOF: Independent - still driving, played golf, retired Immunologist with lots traveling (driving)  PATIENT GOALS: To get my right side back working again  OBJECTIVE:   HAND DOMINANCE: Right (affected side)  ADLs: Overall ADLs: Min to mod assistance Transfers/ambulation related to ADLs: Supervision Eating: Gets help to cut food as needed, having to use L hand Grooming: Used regular razor yesterday for the first time UB Dressing: Needs help to get R arm in sleeve LB Dressing: Can pull pants up most of the time Toileting: Most of the time he is able to perform on his own; uses urinal at bedside at night Bathing: "50/50" with caregiver Tub Shower transfers: Gets help to get his leg into the tub Equipment: Transfer tub bench, Grab bars, bed side commode, and Long handled sponge  IADLs: Shopping: caregiver and family (daughter) Light housekeeping: Caregiver Meal Prep: Psychologist, counselling mobility: RW with hand orthosis & dycem + strap for R hand Medication management: Assistance from caregivers Financial management: Assistance from family Handwriting: unable with R UE; not assessed with L UE  MOBILITY STATUS: Needs Assist: Help to get his R UE/hand on the handle of his walker and Hx of falls  POSTURE COMMENTS:  rounded shoulders and forward head Sitting balance:  WFL in arm chair at  table  ACTIVITY TOLERANCE: Activity tolerance: Limited due to R hemiplegia  FUNCTIONAL OUTCOME MEASURES: PSFS - 1.0  12/03/22 PSFS = 4.7  UPPER EXTREMITY ROM:    Active ROM Right eval Left eval  Shoulder flexion  [facility]  Shoulder abduction    Shoulder adduction    Shoulder extension min   Shoulder internal rotation    Shoulder external rotation    Elbow flexion min   Elbow extension    Wrist flexion min   Wrist extension    Wrist ulnar deviation    Wrist radial deviation    Wrist pronation    Wrist supination    (Blank rows = not tested)  UPPER EXTREMITY MMT:     MMT Right eval Left eval  Shoulder flexion  4/5  Shoulder abduction    Shoulder adduction    Shoulder extension    Shoulder internal rotation    Shoulder external rotation    Middle trapezius    Lower trapezius    Elbow flexion    Elbow extension  Wrist flexion    Wrist extension    Wrist ulnar deviation    Wrist radial deviation    Wrist pronation    Wrist supination    (Blank rows = not tested)  HAND FUNCTION: Grip strength: Right: 3.9, 3.7, 5.2 lbs; Left: 34.8, 36.5, 37.9 lbs Average: Right 4.3 lbs Left 36.4 lbs  12/03/22: Right 6.1, 6.6, 4.8  Left 38.3, 39.4, 38.8 Average: Right 5.8 lbs Left 38.8 lbs  COORDINATION: Unable to perform R UE coordination tasks,   SENSATION: WFL  EDEMA: Slight RUE  MUSCLE TONE: RUE: Hypotonic and Flaccid  COGNITION: Overall cognitive status: Within functional limits for tasks assessed and h/o more severe aphasia and does self-report need to think about what he is trying to say etc  VISION: Subjective report: Eye Dr ~ 3 weeks Baseline vision: Wears glasses for reading only Visual history: cataracts  VISION ASSESSMENT: TBA  Patient is not having difficulty with activities due to vision.  PERCEPTION: Not tested  PRAXIS: Not tested  OBSERVATIONS: Pt ambulated into clinic w/RW and R hand orthosis. Patient was wearing his AFO which he stated he  stopped wearing after he finished home health therapy.   TODAY'S TREATMENT:     OT able to clean dycem to improve pt's grasp of walker handle today. Therapeutic Exercise: Pt completed arm bike in sitting for 5 minutes with average RPM above 10 but up to 18 for endurance, ROM, and strengthening of affected extremity. Pt pedaling forward throughout introductory activity today. Intermittent cues provided to maintain stability with respect to anterior/posterior trunk lean and consistent grasp maintenance with use of Active hand to support RUE.                                                                Neuromuscular Reed: Neuromuscular reeducation provided, including specific visual, tactile and verbal cues and physical assistance to stimulate the neuromuscular system and promote functional ROM, movement and use of R UE for activities such as push/pull, elbow flexion/extension, grasp and release activities.  Pt engaged in working muscles from shoulder, to elbow, forearm, as well as wrist and digits.   Pt able to work well at push and pull of shoulder with forearm support as well as elbow flexion with increased assistance for extension (during table slide) although he is able to push down from flexed position on table top.  Motions continued to be combined to move objects ie) grasp and release small squishy balls, use forearm forearm pronation and wrist flexion to release objects.  Constant physical guidance/support from OTR needed to R forearm.  Pt able to initiate muscle twitches/movements of each joint with most success with flexion, gravity assisted and gripping activities. PATIENT EDUCATION: Education details: RUE NRE Person educated: Patient Education method: Explanation, Demonstration, Tactile cues, and Verbal cues Education comprehension: verbalized understanding, returned demonstration, verbal cues required, tactile cues required, and needs further education  HOME EXERCISE PROGRAM:  10/20/22  - UE ROM (grip, forearm, elbow flex, shoulder shrugs) - no images provided only written suggestions. 10/27/2022: updated ROM HEP Access Code [email or web address] 11/03/22 - sleep positioning handout provided 12/16/22 - shoulder ROM Access Code: [email or web address]  12/22/22 - shoulder ROM Access Code: [email or web address]   GOALS: Goals reviewed with patient? Yes  SHORT TERM GOALS: Target date:  12/31/22  Patient will demonstrate updated RUE HEP with 25% verbal cues or less for proper execution.  Baseline: New to outpatient OT.  Self reported difficulty with motivation at home Goal status: MET 11/19/22 - Pt reported completing HEP. Pt demo'd understanding of exercises with approx. 40% v/c. 12/03/22 - splint 2x/day, stretching hand/wrist, shoulder elevations, squeezing ball   2.  Patient will demonstrate at least 8+ lbs RUE grip strength as needed to stabilize container on tabletop/hold large handled object.  Baseline: Right 4.3 lbs Goal status: IN progress 11/19/22 - Right: 5 lbs, with support from OT and pt's LUE for initial grasp positioning of RUE 12/03/22 - Average: Right 5.8 lbs Left 38.8 lbs  3.  Patient will be able to don T-shirt without physical assistance x 5 days in a row. Baseline: Caregiver assistance to get R UE in sleeve Goal status: IN Progress 11/19/22 - Pt dons t-shirt on LUE and head, then caregiver provides assistance for RUE in sleeve.  4.  Patient will improve R UE AROM for shoulder elevation and elbow flexion to lift arm/hand to walker.  Baseline: Caregiver assist or uses LUE.  Trace elbow/shoulder movement Goal status: IN Progress 11/19/22 - Pt reports "sometimes" placing RUE on walker without LUE assistance though positioning requires full body compensatory movements. Pt primarily continues to use LUE.  5.  Patient will be assisted to don/doff appropriate splints/positioning devices to minimize subluxation and minimize risk of RUE/hand contracture. Baseline: None place Goal status: MET   11/19/22 - Pt reports wearing splint at least 2-3 hours per day, not currently wearing at night d/t discomfort.    LONG TERM GOALS: Target date: 01/28/23  Patient will demonstrate updated RUE HEP with visual schedule and images for proper execution and frequency. Baseline: New to outpatient OT.  Self reported difficulty with motivation at home. Goal status: IN Progress  2.  Patient will demonstrate at least 10 lbs RUE grip strength as needed to hold objects for manipulation by LUE. Baseline: Right 4.3 lbs Goal status: IN Progress 12/03/22 - Average: Right 5.8 lbs Left 38.8 lbs  3.  Patient will be able to get to EOB with no physical assistance to toilet with BSC as needed. Baseline: Assistance to get to EOB from caregivers. Goal status: MET 11/19/22 - Pt continues to use urinal in bed. Pt reports improved ability to ind transition to EOB and standing.   4.  Patient will be able to prepare simple food items with appropriate AE (small crock pot, microwave, toaster oven etc) Baseline: Caregiver/family perform meal prep  Goal status: IN Progress 11/12/22 - showed pt walker tray for home use  5.  Patient will report at least two-point increase in average PSFS score or at least three-point increase in a single activity score indicating functionally significant improvement given minimum detectable change. Baseline: 1.0 total score (See above for individual activity scores)  Goal status: MET and updated to continue to work towards additional 3 pt improvement s/p prog note  12/03/22 PSFS = 4.7   ASSESSMENT:  CLINICAL IMPRESSION: Pt continues to tolerate OT sessions well to facilitate AROM of RUE with good success on UBE today. OTR continues to wants pt to explore a home NMES program for daily neuro-stimulation of muscles as he has the ability to initiate movement in most joints but motions are small and limited in repetitions.  Pt continues to benefit from skilled OT services in the  outpatient setting to work on impairments as noted at eval and compensate  for R hemiplegia to help pt return to max independence with ADLs as able.    PERFORMANCE DEFICITS: in functional skills including ADLs, coordination, dexterity, proprioception, sensation, tone, ROM, strength, flexibility, Fine motor control, Gross motor control, mobility, balance, endurance, decreased knowledge of use of DME, skin integrity, and UE functional use, cognitive skills including energy/drive, safety awareness, and sequencing, and psychosocial skills including coping strategies, environmental adaptation, and routines and behaviors.   IMPAIRMENTS: are limiting patient from ADLs, IADLs, rest and sleep, leisure, and social participation.   CO-MORBIDITIES: may have co-morbidities  that affects occupational performance. Patient will benefit from skilled OT to address above impairments and improve overall function.  REHAB POTENTIAL: Good   PLAN:  OT FREQUENCY: 1-2x/week  OT DURATION: additional 8 weeks  PLANNED INTERVENTIONS: self care/ADL training, therapeutic exercise, therapeutic activity, neuromuscular re-education, manual therapy, passive range of motion, balance training, functional mobility training, aquatic therapy, splinting, electrical stimulation, patient/family education, cognitive remediation/compensation, visual/perceptual remediation/compensation, energy conservation, coping strategies training, and DME and/or AE instructions  RECOMMENDED OTHER SERVICES: PT treatment in place  CONSULTED AND AGREED WITH PLAN OF CARE: Patient and family member/caregiver  PLAN FOR NEXT SESSION:   Continue/establish home E-stim protocols and NRE for R UE AAROM (obtain personal unit?) Review and progress RUE treatment options Shoulder ROM, stability/weightbearing - hard to lift RUE to/above shoulder height - JB (combine all exercises to 1 access code) ADLS/IADLS - work in [employer], OT 01/05/2023,  12:39 PM

## 2023-01-05 NOTE — Therapy (Signed)
OUTPATIENT PHYSICAL THERAPY NEURO TREATMENT   Patient Name: Jimmy Valentine MRN: [id number] DOB:[birth year], 72 y.o., male Today's Date: 01/05/2023    PCP: Wanda Plump, MD REFERRING PROVIDER: Wanda Plump, MD    END OF SESSION:  PT End of Session - 01/05/23 1151     Visit Number 18    Number of Visits 25   recert   Date for PT Re-Evaluation 01/26/23   to allow for delays in scheduling   Authorization Type [employer]    PT Start Time 1149    PT Stop Time 1230    PT Time Calculation (min) 41 min    Equipment Utilized During Treatment Gait belt    Activity Tolerance Patient tolerated treatment well;Patient limited by fatigue    Behavior During Therapy [facility] for tasks assessed/performed                  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,had a MRI, was prescribed testosterone by endocrinology   Sleep apnea    does't use cpap   Sudden hearing loss, left 2017   idiopathic sensorineural hearing loss   Past Surgical History:  Procedure Laterality Date   TOTAL KNEE ARTHROPLASTY Right 2005   TOTAL KNEE ARTHROPLASTY  12/04/2011   LEFT-- TOTAL KNEE ARTHROPLASTY;  Surgeon: Eugenia Mcalpine, MD;  Location: [facility];  Service: Orthopedics;  Laterality: Left;   Patient Active Problem List   Diagnosis Date Noted   Hemiparesis affecting right side as late effect of cerebrovascular accident (CVA) (HCC) 09/04/2022   Gait disturbance, post-stroke 09/04/2022   Hyponatremia 07/18/2022   Aphasia due to acute cerebrovascular accident (CVA) (HCC) 07/15/2022   Acute ischemic left MCA stroke (HCC) 06/24/2022   Stroke (cerebrum) (HCC) 06/21/2022   Chronic mycotic otitis externa 10/11/2019   Conductive hearing loss of left ear 01/05/2019   Sensorineural  hearing loss (SNHL), bilateral 09/24/2016    PCP NOTES >>>>>>>>>>>>>>>>>>. 10/18/2014   Elevated LFTs 04/13/2014   Diabetes (HCC) 09/03/2011   Gout 09/03/2011   OSA (obstructive sleep apnea) 04/22/2011   Annual physical exam 12/04/2010   Hypertension    Hyperlipidemia    Hypogonadism male    Erectile dysfunction     ONSET DATE: 09/23/2022 (referral)   REFERRING DIAG: Q65.784 (ICD-10-CM) - Hemiparesis affecting right side as late effect of cerebrovascular accident (CVA) (HCC)  THERAPY DIAG:  Muscle weakness (generalized)  Other lack of coordination  Hemiplegia and hemiparesis following cerebral infarction affecting right dominant side (HCC)  Unsteadiness on feet  Rationale for Evaluation and Treatment: Rehabilitation  SUBJECTIVE:  SUBJECTIVE STATEMENT:  Pt reports he is still doing fine since his last fall and has had no falls beyond this.  He denies pain or acute changes today.  He presents alone in gym following OT w/ 2WW seated on mat table.  Pt accompanied by:  Caregiver, [name] (in lobby)   PERTINENT HISTORY: L MCA CVA in May 2024   PAIN:  Are you having pain? No  PRECAUTIONS: Fall  WEIGHT BEARING RESTRICTIONS: No  FALLS: Has patient fallen in last 6 months? Yes. Number of falls 1  LIVING ENVIRONMENT: Lives with: lives alone Lives in: House/apartment Stairs: Yes: External: 3 steps; on right going up, on left going up, and can reach both Has following equipment at home: Walker - 2 wheeled, Wheelchair (manual), shower chair, bed side commode, and AFO  PLOF: Independent  PATIENT GOALS: "Get back to where I can walk by myself without the walker and without assistance"   OBJECTIVE:   DIAGNOSTIC FINDINGS: MRI of brain on 06/22/22  IMPRESSION: 1. Positive for Left MCA  territory lacunar type infarct tracking from the left corona radiata to the lentiform. Petechial hemorrhage (Heidelberg classification 1b: HI2). No malignant hemorrhagic transformation. No significant mass effect.   2. No other acute intracranial abnormality. 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  CT angio of head/neck on 06/21/22 IMPRESSION: 1. 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 The left MCA bi/trifurcation and left M2 branches remain patent.   2. Superimposed Severe intracranial and cervical ICA Atherosclerosis resulting in: - distal Right Vertebral Artery (V4) OCCLUSION. - distal Left Vertebral Artery Severe (V4) stenosis. - Right ICA origin RADIOGRAPHIC STRING SIGN stenosis due to calcified plaque. - Left ICA origin stenosis due to complex plaque estimated 65-70%. - Severe Left ICA supraclinoid stenosis due to calcified plaque.  COGNITION: Overall cognitive status: Within functional limits for tasks assessed   SENSATION: Pt denies numbness/tingling in RLE/RUE   COORDINATION: Heel to shin test: WNL on LLE, unable to perform hip flexion on RLE but able to achieve cross-legged position    MUSCLE TONE: Mild clonus in R foot that fatigues after 3s   POSTURE: rounded shoulders and forward head   LOWER EXTREMITY MMT:  Tested in seated position   MMT Right Eval Left Eval  Hip flexion 2- 4+  Hip extension    Hip abduction 1 4+  Hip adduction 2 4+  Hip internal rotation    Hip external rotation    Knee flexion 3 4+  Knee extension 2 4+  Ankle dorsiflexion 4- 4+  Ankle plantarflexion    Ankle inversion    Ankle eversion    (Blank rows = not tested)  BED MOBILITY:  Has a rail but requires assistance (up to max) to roll and  perform sit <>supine   TRANSFERS: Assistive device utilized: Environmental consultant - 2 wheeled  Sit to stand: SBA Stand to sit: SBA Pt kickstands RLE and leans to L side to perform    VITALS  There were no vitals filed for this visit.     TODAY'S TREATMENT:        Gait Training  Gait pattern:  ER of RLE, step through pattern, decreased stride length, decreased hip/knee flexion- Right, decreased ankle dorsiflexion- Right, lateral hip instability, lateral lean- Left, narrow BOS, and poor foot clearance-  Right Distance walked: 115 ft + 115 ft Assistive device utilized: Walker - 2 wheeled and R AFO and built-in toe cap Level of assistance: CGA and Min A Comments: PT provides minA for facilitation of right hip translation during limb advancement allowing pt to engage first and to stabilize pt when turning left due to far spacing of NBQC during turns on level track.  Discussed decreasing cane placement from BOS during turns to improve stability.  LOB x2.  Pt has slightly narrowed BOS w/ LBQC w/o LOB and better placement of cane.  He states it does not feel heavy and he feels more stable.  He does not own a cane and is waiting to purchase one.  PT instructed to not purchase until further practice and assessment.  -Pt ambulates to ballet bar CGA w/ LBQC and at bar performs RLE kickovers of 6" cones to promote hip and knee flexion -SciFit x6 minutes using RUE w/ active hands assist and BLE progressing to level 3.0 for improved hip and knee flexion and global strengthening; pt visibly fatigued following task requiring brief rest then PT ambulated pt to lobby CGA using 2WW for improved stability.  Handoff to caregiver in lobby w/ encouragement for close CGA due to fatigue.                                                                                              PATIENT EDUCATION:  Education details: Continue HEP, prioritizing R foot position when performing transfers and turns.  See above for further. Person  educated: Patient Education method: Medical illustrator Education comprehension: verbalized understanding and needs further education  HOME EXERCISE PROGRAM:  Access Code: [email or web address] URL: [email or web address] Date: 11/12/2022 Prepared by: Alethia Berthold Plaster  Exercises - Supine Figure 4 Piriformis Stretch  - 1 x daily - 7 x weekly - 3 sets - 1-3 minute hold - Seated Piriformis Stretch  - 1 x daily - 7 x weekly - 3 sets - 1-3 minute hold - Unilateral Supported Supine Butterfly Stretch  - 1 x daily - 7 x weekly - 3 sets - 2 minute hold  GOALS: Goals reviewed with patient? Yes  SHORT TERM GOALS: Target date: 10/28/2022    Pt will perform initial HEP w/min A from caregiver for improved strength, balance, transfers and gait.  Baseline: to be established  Goal status: IN PROGRESS  2.  Pt will improve gait velocity to at least 0.7 ft/s w/RW and CGA for improved gait efficiency and independence   Baseline: 0.49 ft/s w/RW and min A; 0.66 ft/s w/RW and SBA Goal status: PARTIALLY MET   3.  Pt will be able to don and doff R hand orthotic on RW w/CGA for improved independence and weight shift to R side  Baseline: max A; min A; SBA (10/3)  Goal status: MET   4.  Pt will ambulate greater than or equal to 300 feet on with LRAD and CGA for improved cardiovascular endurance and BLE strength.  Baseline: 213 ft with RW and min A (9/17); 195' w/RW and SBA  Goal status: PARTIALLY MET   5.  Sharlene Motts  to be assessed and LTG written  Baseline: 25/56 Goal status: MET  6.  Pt will perform supine > sit EOB w/min A or use of rail for improved independence and functional strength Baseline: mod-max A; min A (9/26) Goal status: MET  LONG TERM GOALS: Target date: 11/25/2022    Pt will be independent with final HEP for improved strength, balance, transfers and gait.  Baseline: "going alright" Goal status: In Progress  2.  Pt will improve gait velocity to at least 1.0 ft/s w/LRAD  and SBA for improved gait efficiency and independence   Baseline: 0.49 ft/s w/RW and min A; 0.66 ft/s w/RW and SBA, 0.60 ft/s w/ RW and close SBA (12/01/22) Goal status: NOT MET  3.  Pt will ambulate greater than or equal to 400 feet on with LRAD and CGA for improved cardiovascular endurance and BLE strength.   Baseline: 213 ft with RW and min A (9/17), 195' w/RW and SBA, 208 with RW and CGA (12/01/22) Goal status: NOT MET  4.  Pt will improve Berg score to 34/56 for decreased fall risk  Baseline: 25/56, 35/56 (12/01/22) Goal status: MET  5.  Pt will perform bed mobility at CGA or SBA level for improved independence at home  Baseline: mod-max A, Mod I Goal status: MET    RECERTIFICATION SHORT TERM GOALS=  12/22/2022   1.  Pt will improve gait velocity to at least 0.75 ft/s w/ LRAD and SBA for improved gait efficiency and independence. Baseline: 0.60 ft/s w/ RW and close SBA (12/01/22); 0.71 ft/s w/RW and SBA Goal status: PARTIALLY MET    RECERTIFICATION LONG TERM GOALS:  Target date: 01/12/2023   Pt will improve gait velocity to at least 1.0 ft/s w/ LRAD and SBA for improved gait efficiency and independence. Baseline: 0.60 ft/s w/ RW and close SBA (12/01/22) Goal status: INITIAL  2.  Pt will improve Berg score to 39/56 for decreased fall risk Baseline: 35/56 (12/01/22) Goal status: INITIAL  3.  Pt will ambulate greater than or equal to 400 feet on with LRAD and CGA for improved cardiovascular endurance and BLE strength.  Baseline: 208 FT with RW and CGA (12/01/22) Goal status: INITIAL  4.  Pt will safely transfer from the floor to standing with Mod A or less using proper body mechanics Baseline:  Goal status: INITIAL   ASSESSMENT:  CLINICAL IMPRESSION:  Focus of skilled session on continued gait training with narrow and large base quad canes to determine best fit.  Patient demonstrates more narrowed BOS with [facility] likely due to surface area of base of cane  and he has some placement issues during turns with NBQC creating instability.  Will likely work with pt more on this in future sessions to continue promoting safety with LRAD and improved hip and knee flexion as attempted today.  Will continue per POC.   OBJECTIVE IMPAIRMENTS: Abnormal gait, decreased activity tolerance, decreased balance, decreased coordination, decreased mobility, difficulty walking, decreased strength, impaired tone, impaired UE functional use, improper body mechanics, and pain.   ACTIVITY LIMITATIONS: carrying, lifting, standing, squatting, stairs, transfers, bed mobility, bathing, toileting, dressing, self feeding, reach over head, hygiene/grooming, locomotion level, and caring for others  PARTICIPATION LIMITATIONS: meal prep, cleaning, laundry, medication management, personal finances, interpersonal relationship, driving, shopping, community activity, and yard work  PERSONAL FACTORS: Fitness, Past/current experiences, Transportation, and 1 comorbidity: CVA  are also affecting patient's functional outcome.   REHAB POTENTIAL: Good  CLINICAL DECISION MAKING: Evolving/moderate complexity  EVALUATION  COMPLEXITY: Moderate  PLAN:  PT FREQUENCY: 2x/week  PT DURATION: 8 weeks (POC written for 10 weeks due to delay in scheduling) + 6 weeks (recert)  PLANNED INTERVENTIONS: Therapeutic exercises, Therapeutic activity, Neuromuscular re-education, Balance training, Gait training, Patient/Family education, Self Care, Joint mobilization, Stair training, Orthotic/Fit training, DME instructions, Aquatic Therapy, Dry Needling, Electrical stimulation, Manual therapy, and Re-evaluation  PLAN FOR NEXT SESSION: Gait w/SBQC vs LBQC, Continue TM training? Will need +2 if doing tall kneel activities. Prone- decrease assist amount? continue with Bioness to R HS and ant tib; RLE NMR, weight shift to R side, work on bed mobility and donning/doffing RUE from hand orthotic and donning/doffing AFO  more independently; can trial hemiwalker ambulation- sling for RUE?, stepping activities at parallel/ballet bars, pt wants to do stair activities (step ups/taps?)  Camille Bal, PT, DPT 01/05/2023, 1:59 PM

## 2023-01-07 ENCOUNTER — Ambulatory Visit: Payer: Medicare HMO | Admitting: Physical Therapy

## 2023-01-07 ENCOUNTER — Ambulatory Visit: Payer: Medicare HMO | Admitting: Occupational Therapy

## 2023-01-07 VITALS — BP 137/74 | HR 87

## 2023-01-07 DIAGNOSIS — I69351 Hemiplegia and hemiparesis following cerebral infarction affecting right dominant side: Secondary | ICD-10-CM | POA: Diagnosis not present

## 2023-01-07 DIAGNOSIS — R2689 Other abnormalities of gait and mobility: Secondary | ICD-10-CM | POA: Diagnosis not present

## 2023-01-07 DIAGNOSIS — M6281 Muscle weakness (generalized): Secondary | ICD-10-CM

## 2023-01-07 DIAGNOSIS — R278 Other lack of coordination: Secondary | ICD-10-CM

## 2023-01-07 DIAGNOSIS — R2681 Unsteadiness on feet: Secondary | ICD-10-CM

## 2023-01-07 DIAGNOSIS — R29818 Other symptoms and signs involving the nervous system: Secondary | ICD-10-CM

## 2023-01-07 DIAGNOSIS — S43111D Subluxation of right acromioclavicular joint, subsequent encounter: Secondary | ICD-10-CM | POA: Diagnosis not present

## 2023-01-07 NOTE — Therapy (Signed)
OUTPATIENT PHYSICAL THERAPY NEURO TREATMENT   Patient Name: Jimmy Valentine MRN: [id number] DOB:[birth year], 72 y.o., male Today's Date: 01/07/2023    PCP: Wanda Plump, MD REFERRING PROVIDER: Wanda Plump, MD    END OF SESSION:  PT End of Session - 01/07/23 1102     Visit Number 19    Number of Visits 25   recert   Date for PT Re-Evaluation 01/26/23   to allow for delays in scheduling   Authorization Type [employer]    PT Start Time 1101    PT Stop Time 1145    PT Time Calculation (min) 44 min    Equipment Utilized During Treatment Gait belt    Activity Tolerance Patient tolerated treatment well    Behavior During Therapy [facility] for tasks assessed/performed                   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,had a MRI, was prescribed testosterone by endocrinology   Sleep apnea    does't use cpap   Sudden hearing loss, left 2017   idiopathic sensorineural hearing loss   Past Surgical History:  Procedure Laterality Date   TOTAL KNEE ARTHROPLASTY Right 2005   TOTAL KNEE ARTHROPLASTY  12/04/2011   LEFT-- TOTAL KNEE ARTHROPLASTY;  Surgeon: Eugenia Mcalpine, MD;  Location: [facility];  Service: Orthopedics;  Laterality: Left;   Patient Active Problem List   Diagnosis Date Noted   Hemiparesis affecting right side as late effect of cerebrovascular accident (CVA) (HCC) 09/04/2022   Gait disturbance, post-stroke 09/04/2022   Hyponatremia 07/18/2022   Aphasia due to acute cerebrovascular accident (CVA) (HCC) 07/15/2022   Acute ischemic left MCA stroke (HCC) 06/24/2022   Stroke (cerebrum) (HCC) 06/21/2022   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 10/18/2014   Elevated LFTs 04/13/2014   Diabetes (HCC) 09/03/2011   Gout 09/03/2011   OSA (obstructive sleep apnea) 04/22/2011   Annual physical exam 12/04/2010   Hypertension    Hyperlipidemia    Hypogonadism male    Erectile dysfunction     ONSET DATE: 09/23/2022 (referral)   REFERRING DIAG: W09.811 (ICD-10-CM) - Hemiparesis affecting right side as late effect of cerebrovascular accident (CVA) (HCC)  THERAPY DIAG:  Hemiplegia and hemiparesis following cerebral infarction affecting right dominant side (HCC)  Muscle weakness (generalized)  Unsteadiness on feet  Other abnormalities of gait and mobility  Rationale for Evaluation and Treatment: Rehabilitation  SUBJECTIVE:  SUBJECTIVE STATEMENT:  Pt reports his sciatica is flared up today, took iBuprofen prior to session. Rating pain as a 4/10 . No falls or acute changes to report.   Pt accompanied by:  Caregiver, [name] (in lobby)   PERTINENT HISTORY: L MCA CVA in May 2024   PAIN:  Are you having pain? Yes: NPRS scale: 4/10 Pain location: L sciatica  Pain description: nerve pain  PRECAUTIONS: Fall  WEIGHT BEARING RESTRICTIONS: No  FALLS: Has patient fallen in last 6 months? Yes. Number of falls 1  LIVING ENVIRONMENT: Lives with: lives alone Lives in: House/apartment Stairs: Yes: External: 3 steps; on right going up, on left going up, and can reach both Has following equipment at home: Walker - 2 wheeled, Wheelchair (manual), shower chair, bed side commode, and AFO  PLOF: Independent  PATIENT GOALS: "Get back to where I can walk by myself without the walker and without assistance"   OBJECTIVE:   DIAGNOSTIC FINDINGS: MRI of brain on 06/22/22  IMPRESSION: 1. Positive for Left MCA  territory lacunar type infarct tracking from the left corona radiata to the lentiform. Petechial hemorrhage (Heidelberg classification 1b: HI2). No malignant hemorrhagic transformation. No significant mass effect.   2. No other acute intracranial abnormality. 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  CT angio of head/neck on 06/21/22 IMPRESSION: 1. 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 The left MCA bi/trifurcation and left M2 branches remain patent.   2. Superimposed Severe intracranial and cervical ICA Atherosclerosis resulting in: - distal Right Vertebral Artery (V4) OCCLUSION. - distal Left Vertebral Artery Severe (V4) stenosis. - Right ICA origin RADIOGRAPHIC STRING SIGN stenosis due to calcified plaque. - Left ICA origin stenosis due to complex plaque estimated 65-70%. - Severe Left ICA supraclinoid stenosis due to calcified plaque.  COGNITION: Overall cognitive status: Within functional limits for tasks assessed   SENSATION: Pt denies numbness/tingling in RLE/RUE   COORDINATION: Heel to shin test: WNL on LLE, unable to perform hip flexion on RLE but able to achieve cross-legged position    MUSCLE TONE: Mild clonus in R foot that fatigues after 3s   POSTURE: rounded shoulders and forward head   LOWER EXTREMITY MMT:  Tested in seated position   MMT Right Eval Left Eval  Hip flexion 2- 4+  Hip extension    Hip abduction 1 4+  Hip adduction 2 4+  Hip internal rotation    Hip external rotation    Knee flexion 3 4+  Knee extension 2 4+  Ankle dorsiflexion 4- 4+  Ankle plantarflexion    Ankle inversion    Ankle eversion    (Blank rows = not tested)  BED MOBILITY:  Has a rail but requires assistance (up to max) to roll and  perform sit <>supine   TRANSFERS: Assistive device utilized: Environmental consultant - 2 wheeled  Sit to stand: SBA Stand to sit: SBA Pt kickstands RLE and leans to L side to perform    VITALS  Vitals:   01/07/23 1105  BP: 137/74  Pulse: 87       TODAY'S TREATMENT:        Ther Act  Assessed vitals (see above) and WNL for therapy.   Gait Training  Gait pattern:  ER of RLE, step through pattern, decreased stride length, decreased hip/knee flexion-  Right, decreased ankle dorsiflexion- Right, lateral hip instability, lateral lean- Left, narrow BOS, and poor foot clearance- Right Distance walked: 115 ft x2 plus various clinic distances Assistive device utilized: Quad cane large base and R AFO and built-in toe cap Level of assistance: CGA and Min A Comments: Noted pt places cane further away as he fatigues, increasing likelihood of him catching his R foot. No major LOB this date but pt more challenged when turning to R side.   STAIRS:  Level of Assistance: CGA Stair Negotiation Technique: Step to Pattern with Single Rail on Left  Number of Stairs: 6  Height of Stairs: 6"  Comments: performed step 1 up and 1 down at a time to work on R knee and hip flexion w/retro step. Min cues to avoid circumduction of RLE throughout. Min cues to ascend w/LLE and descend w/RLE, as pt initially attempting to ascend w/RLE.    RAMP:  Level of Assistance: Min A Assistive device utilized: Quad cane large base Ramp Comments: Min cues for large step clearance w/RLE throughout. Increased difficulty descending ramp due to decreased eccentric control of RLE. No LOB noted    NMR  Stepping over various short obstacles (dowel, PVC) w/LBQC, x6 reps, for improved step clearance and hip/knee flexion of RLE. Pt required min cues for proper sequencing of cane initially and CGA throughout.                                                                PATIENT EDUCATION:  Education details: Continue HEP  Person educated:  Patient Education method: Explanation Education comprehension: verbalized understanding  HOME EXERCISE PROGRAM:  Access Code: [email or web address] URL: [email or web address] Date: 11/12/2022 Prepared by: Alethia Berthold Tailey Top  Exercises - Supine Figure 4 Piriformis Stretch  - 1 x daily - 7 x weekly - 3 sets - 1-3 minute hold - Seated Piriformis Stretch  - 1 x daily - 7 x weekly - 3 sets - 1-3 minute hold - Unilateral Supported Supine Butterfly Stretch  - 1 x daily - 7 x weekly - 3 sets - 2 minute hold  GOALS: Goals reviewed with patient? Yes  SHORT TERM GOALS: Target date: 10/28/2022    Pt will perform initial HEP w/min A from caregiver for improved strength, balance, transfers and gait.  Baseline: to be established  Goal status: IN PROGRESS  2.  Pt will improve gait velocity to at least 0.7 ft/s w/RW and CGA for improved gait efficiency and independence   Baseline: 0.49 ft/s w/RW and min A; 0.66 ft/s w/RW and SBA Goal status: PARTIALLY MET   3.  Pt will be able to don and doff R hand orthotic on RW w/CGA for improved independence and weight shift to R side  Baseline: max A; min A; SBA (10/3)  Goal status: MET   4.  Pt will ambulate greater than or equal to 300 feet on with LRAD and CGA for improved cardiovascular endurance and BLE strength.  Baseline: 213 ft with RW and min A (9/17); 195' w/RW and SBA  Goal status: PARTIALLY MET   5.  Berg to be assessed and LTG written  Baseline: 25/56 Goal status: MET  6.  Pt will perform supine > sit EOB w/min A or use of rail for improved independence  and functional strength Baseline: mod-max A; min A (9/26) Goal status: MET  LONG TERM GOALS: Target date: 11/25/2022    Pt will be independent with final HEP for improved strength, balance, transfers and gait.  Baseline: "going alright" Goal status: In Progress  2.  Pt will improve gait velocity to at least 1.0 ft/s w/LRAD and SBA for improved gait efficiency and independence    Baseline: 0.49 ft/s w/RW and min A; 0.66 ft/s w/RW and SBA, 0.60 ft/s w/ RW and close SBA (12/01/22) Goal status: NOT MET  3.  Pt will ambulate greater than or equal to 400 feet on with LRAD and CGA for improved cardiovascular endurance and BLE strength.   Baseline: 213 ft with RW and min A (9/17), 195' w/RW and SBA, 208 with RW and CGA (12/01/22) Goal status: NOT MET  4.  Pt will improve Berg score to 34/56 for decreased fall risk  Baseline: 25/56, 35/56 (12/01/22) Goal status: MET  5.  Pt will perform bed mobility at CGA or SBA level for improved independence at home  Baseline: mod-max A, Mod I Goal status: MET    RECERTIFICATION SHORT TERM GOALS=  12/22/2022   1.  Pt will improve gait velocity to at least 0.75 ft/s w/ LRAD and SBA for improved gait efficiency and independence. Baseline: 0.60 ft/s w/ RW and close SBA (12/01/22); 0.71 ft/s w/RW and SBA Goal status: PARTIALLY MET    RECERTIFICATION LONG TERM GOALS:  Target date: 01/12/2023   Pt will improve gait velocity to at least 1.0 ft/s w/ LRAD and SBA for improved gait efficiency and independence. Baseline: 0.60 ft/s w/ RW and close SBA (12/01/22) Goal status: INITIAL  2.  Pt will improve Berg score to 39/56 for decreased fall risk Baseline: 35/56 (12/01/22) Goal status: INITIAL  3.  Pt will ambulate greater than or equal to 400 feet on with LRAD and CGA for improved cardiovascular endurance and BLE strength.  Baseline: 208 FT with RW and CGA (12/01/22) Goal status: INITIAL  4.  Pt will safely transfer from the floor to standing with Mod A or less using proper body mechanics Baseline:  Goal status: INITIAL   ASSESSMENT:  CLINICAL IMPRESSION:  Emphasis of skilled PT session on obstacle navigation w/LBQC, facilitation of hip/knee flexion of RLE and continued gait training. Pt demonstrated improvement w/sequencing of LBQC this date, but does tend to place cane too far anteriorly w/fatigue, increasing  likelihood of him catching his R foot w/gait. Pt able to ascend/descend ramp w/min A and LBQC slowly, but limited by poor eccentric control of RLE. Continue POC.   OBJECTIVE IMPAIRMENTS: Abnormal gait, decreased activity tolerance, decreased balance, decreased coordination, decreased mobility, difficulty walking, decreased strength, impaired tone, impaired UE functional use, improper body mechanics, and pain.   ACTIVITY LIMITATIONS: carrying, lifting, standing, squatting, stairs, transfers, bed mobility, bathing, toileting, dressing, self feeding, reach over head, hygiene/grooming, locomotion level, and caring for others  PARTICIPATION LIMITATIONS: meal prep, cleaning, laundry, medication management, personal finances, interpersonal relationship, driving, shopping, community activity, and yard work  PERSONAL FACTORS: Fitness, Past/current experiences, Transportation, and 1 comorbidity: CVA  are also affecting patient's functional outcome.   REHAB POTENTIAL: Good  CLINICAL DECISION MAKING: Evolving/moderate complexity  EVALUATION COMPLEXITY: Moderate  PLAN:  PT FREQUENCY: 2x/week  PT DURATION: 8 weeks (POC written for 10 weeks due to delay in scheduling) + 6 weeks (recert)  PLANNED INTERVENTIONS: Therapeutic exercises, Therapeutic activity, Neuromuscular re-education, Balance training, Gait training, Patient/Family education, Self Care, Joint  mobilization, Stair training, Orthotic/Fit training, DME instructions, Aquatic Therapy, Dry Needling, Electrical stimulation, Manual therapy, and Re-evaluation  PLAN FOR NEXT SESSION: 20th visit PN. Gait w/SBQC vs LBQC, Continue TM training? Will need +2 if doing tall kneel activities. Prone- decrease assist amount? continue with Bioness to R HS and ant tib; RLE NMR, weight shift to R side, work on bed mobility and donning/doffing RUE from hand orthotic and donning/doffing AFO more independently; can trial hemiwalker ambulation- sling for RUE?, stepping  activities at parallel/ballet bars, pt wants to do stair activities (step ups/taps?)  Massa Pe E Doroteo Nickolson, PT, DPT 01/07/2023, 11:52 AM

## 2023-01-07 NOTE — Therapy (Signed)
OUTPATIENT OCCUPATIONAL THERAPY NEURO TREATMENT  Patient Name: Jimmy Valentine MRN: [id number] DOB:[birth year], 72 y.o., male Today's Date: 01/07/2023  PCP: Wanda Plump, MD REFERRING PROVIDER: Wanda Plump, MD  END OF SESSION:  OT End of Session - 01/07/23 1536     Visit Number 18    Number of Visits 32    Date for OT Re-Evaluation 02/05/23    Authorization Type [employer] 2024 Met VL: [place] Auth Not Reqd    OT Start Time 1020    OT Stop Time 1100    OT Time Calculation (min) 40 min    Equipment Utilized During Treatment UBE    Activity Tolerance Patient tolerated treatment well    Behavior During Therapy WFL for tasks assessed/performed               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,had a MRI, was prescribed testosterone by endocrinology   Sleep apnea    does't use cpap   Sudden hearing loss, left 2017   idiopathic sensorineural hearing loss   Past Surgical History:  Procedure Laterality Date   TOTAL KNEE ARTHROPLASTY Right 2005   TOTAL KNEE ARTHROPLASTY  12/04/2011   LEFT-- TOTAL KNEE ARTHROPLASTY;  Surgeon: Eugenia Mcalpine, MD;  Location: [facility];  Service: Orthopedics;  Laterality: Left;   Patient Active Problem List   Diagnosis Date Noted   Hemiparesis affecting right side as late effect of cerebrovascular accident (CVA) (HCC) 09/04/2022   Gait disturbance, post-stroke 09/04/2022   Hyponatremia 07/18/2022   Aphasia due to acute cerebrovascular accident (CVA) (HCC) 07/15/2022   Acute ischemic left MCA stroke (HCC) 06/24/2022   Stroke (cerebrum) (HCC) 06/21/2022   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 10/18/2014   Elevated LFTs 04/13/2014   Diabetes (HCC) 09/03/2011   Gout 09/03/2011   OSA (obstructive sleep apnea) 04/22/2011   Annual physical exam 12/04/2010   Hypertension    Hyperlipidemia    Hypogonadism male    Erectile dysfunction     ONSET DATE: Referral date: 10/01/2022  Stroke 06/21/22  REFERRING DIAG: I69.351 (ICD-10-CM) - Hemiplegia and hemiparesis following cerebral infarction affecting right dominant side (HCC)  THERAPY DIAG:   Muscle weakness (generalized)  Other lack of coordination  Hemiplegia and hemiparesis following cerebral infarction affecting right dominant side (HCC)  Other symptoms and signs involving the nervous system  Rationale for Evaluation and Treatment: Rehabilitation  SUBJECTIVE:   SUBJECTIVE STATEMENT:  Pt reported that he took ibuprofen this morning before coming to therapy due to sciatic nerve problems when he woke up today.  Pt reported that his arm was fine after UBE earlier this week but he was jsut tired  Pt reports that he is getting OOB by himself now, turing on the heat and going back to bed until his caregiver arrives.  He has even worked on getting dressed himself but still has trouble with his R foot.   Pt accompanied by: self  PERTINENT HISTORY:  Presented to [facility] ED on 06/21/2022 with sudden onset of right-sided weakness, dysarthria and facial drooping. MRI showed large MCA  territory basal ganglia infarct with petechial hemorrhage with no hemorrhagic transformation or mass effect.   Inpatient Rehab: 06/24/2022 - 07/30/2022 Patient discharged at [facility] Assist level.  Patient's care partners able to provide the necessary physical assistance at discharge.   Recommendation:  Patient will benefit from ongoing skilled OT services in home health setting to continue to advance functional skills in the area of BADL, iADL, and Reduce care partner burden. Equipment: Shower chair, commode, RW, R RW hand splint, R  resting hand splint, LH sponge, Dycem   Home Health therapies 07/24 - 08/24     PRECAUTIONS: Fall  WEIGHT BEARING RESTRICTIONS: No  PAIN:  Are you having pain? No  FALLS: Has patient fallen in last 6 months? Yes. Number of falls 1x - fell on his bottom about a month prior to evaluation - got the walker stuck on the doorframe  11/26/22 - had spasms in the AM and fell in PM trying to sit down but fell forwards instead 12/25/22 - turning in the kitchen - fell to Right side and bumped elbow  LIVING ENVIRONMENT: Lives with: lives alone and Caregiver 3-4 days/week x 12 hours, daughter, son and brother-in-law assist on other days Lives in: House/apartment Stairs: Yes: External: 3-5 steps; can reach both Has following equipment at home: Environmental consultant - 2 wheeled, Wheelchair (manual), Shower bench, bed side commode, Grab bars, and urinal  PLOF: Independent - still driving, played golf, retired Immunologist with lots traveling (driving)  PATIENT GOALS: To get my right side back working again  OBJECTIVE:   HAND DOMINANCE: Right (affected side)  ADLs: Overall ADLs: Min to mod assistance Transfers/ambulation related to ADLs: Supervision Eating: Gets help to cut food as needed, having to use L hand Grooming: Used regular razor yesterday for the first time UB Dressing: Needs help to get R arm in sleeve LB Dressing: Can pull pants up most of the time Toileting: Most of the time he is able to perform on his own; uses urinal at bedside at night Bathing: "50/50" with caregiver Tub Shower transfers: Gets help to get his leg into the tub Equipment: Transfer tub bench, Grab bars, bed side commode, and Long handled sponge  IADLs: Shopping: caregiver and family (daughter) Light housekeeping: Caregiver Meal Prep: Psychologist, counselling mobility: RW with hand orthosis & dycem + strap for R hand Medication management: Assistance from caregivers Financial management: Assistance from  family Handwriting: unable with R UE; not assessed with L UE  MOBILITY STATUS: Needs Assist: Help to get his R UE/hand on the handle of his walker and Hx of falls  POSTURE COMMENTS:  rounded shoulders and forward head Sitting balance:  WFL in arm chair at table  ACTIVITY TOLERANCE: Activity tolerance: Limited due to R hemiplegia  FUNCTIONAL OUTCOME MEASURES: PSFS - 1.0  12/03/22 PSFS = 4.7  UPPER EXTREMITY ROM:    Active ROM Right eval Left eval  Shoulder flexion  [facility]  Shoulder abduction    Shoulder adduction    Shoulder extension min   Shoulder internal rotation    Shoulder external rotation    Elbow flexion min   Elbow extension    Wrist flexion min   Wrist extension    Wrist ulnar deviation    Wrist radial deviation    Wrist pronation    Wrist supination    (Blank rows = not tested)  UPPER EXTREMITY MMT:     MMT Right eval Left eval  Shoulder flexion  4/5  Shoulder abduction  Shoulder adduction    Shoulder extension    Shoulder internal rotation    Shoulder external rotation    Middle trapezius    Lower trapezius    Elbow flexion    Elbow extension    Wrist flexion    Wrist extension    Wrist ulnar deviation    Wrist radial deviation    Wrist pronation    Wrist supination    (Blank rows = not tested)  HAND FUNCTION: Grip strength: Right: 3.9, 3.7, 5.2 lbs; Left: 34.8, 36.5, 37.9 lbs Average: Right 4.3 lbs Left 36.4 lbs  12/03/22: Right 6.1, 6.6, 4.8  Left 38.3, 39.4, 38.8 Average: Right 5.8 lbs Left 38.8 lbs  COORDINATION: Unable to perform R UE coordination tasks,   SENSATION: WFL  EDEMA: Slight RUE  MUSCLE TONE: RUE: Hypotonic and Flaccid  COGNITION: Overall cognitive status: Within functional limits for tasks assessed and h/o more severe aphasia and does self-report need to think about what he is trying to say etc  VISION: Subjective report: Eye Dr ~ 3 weeks Baseline vision: Wears glasses for reading only Visual history:  cataracts  VISION ASSESSMENT: TBA  Patient is not having difficulty with activities due to vision.  PERCEPTION: Not tested  PRAXIS: Not tested  OBSERVATIONS: Pt ambulated into clinic w/RW and R hand orthosis. Patient was wearing his AFO which he stated he stopped wearing after he finished home health therapy.   TODAY'S TREATMENT:     Therapeutic Exercise: Pt completed arm bike in sitting for 10 minutes today (up from 5 minutes earlier this week) with average RPM above 20 and up to 30 (up from just above 10 earlier this week) for endurance, ROM, and strengthening of affected extremity. Pt pedaling forward x 5 minutes and backwards x 5 minutes during activity today with minor adjustments in seat height to improve comfort of R UE. Intermittent cues provided to maintain stability with respect to anterior/posterior trunk lean and consistent grasp maintenance with use of Active hand to support RUE as it did require reposition near the end of 10 minutes. today.                                                                Neuromuscular Reed: Neuromuscular reeducation provided, including specific visual, tactile and verbal cues and physical assistance to stimulate the neuromuscular system and promote functional ROM, movement and use of R UE for activities such as grasp and release activities as well as lifting and placing objects.  Pt engaged in working muscles from shoulder, to elbow, forearm, as well as wrist and digits.   Pt able to work well at grasp and release (although more of 'relax' than digital extension) with forearm support from OTR.  Pt is able to hold soft ball with OTR pulling on loop for 5 to 10+ seconds with cues to keep holding an then relax to allow object to be pulled from his grip.  He had difficulty performing this tasks while holding the object with his L hand though and needed support from OT for max success with task.   Grasping motions combined with moving cones ie) grasp, pick  up and move/stack cones. Attempted to have pt mirror task with L UE but he had a hard time performing task  simultaneously with R/L UE.  Constant physical guidance/support from OTR needed to support and move R forearm.  Pt able to initiate flexion today for grasp, lifting objects and stacking cones with most success still with elbow flexion, gravity assisted and gripping activities. PATIENT EDUCATION: Education details: RUE NRE Person educated: Patient Education method: Explanation, Demonstration, Tactile cues, and Verbal cues Education comprehension: verbalized understanding, returned demonstration, verbal cues required, tactile cues required, and needs further education  HOME EXERCISE PROGRAM:  10/20/22 - UE ROM (grip, forearm, elbow flex, shoulder shrugs) - no images provided only written suggestions. 10/27/2022: updated ROM HEP Access Code [email or web address] 11/03/22 - sleep positioning handout provided 12/16/22 - shoulder ROM Access Code: [email or web address]  12/22/22 - shoulder ROM Access Code: [email or web address]   GOALS: Goals reviewed with patient? Yes  SHORT TERM GOALS: Target date: 12/31/22  Patient will demonstrate updated RUE HEP with 25% verbal cues or less for proper execution.  Baseline: New to outpatient OT.  Self reported difficulty with motivation at home Goal status: MET 11/19/22 - Pt reported completing HEP. Pt demo'd understanding of exercises with approx. 40% v/c. 12/03/22 - splint 2x/day, stretching hand/wrist, shoulder elevations, squeezing ball   2.  Patient will demonstrate at least 8+ lbs RUE grip strength as needed to stabilize container on tabletop/hold large handled object.  Baseline: Right 4.3 lbs Goal status: IN progress 11/19/22 - Right: 5 lbs, with support from OT and pt's LUE for initial grasp positioning of RUE 12/03/22 - Average: Right 5.8 lbs Left 38.8 lbs  3.  Patient will be able to don T-shirt without physical assistance x 5 days in a row. Baseline: Caregiver assistance to get R  UE in sleeve Goal status: IN Progress 11/19/22 - Pt dons t-shirt on LUE and head, then caregiver provides assistance for RUE in sleeve.  4.  Patient will improve R UE AROM for shoulder elevation and elbow flexion to lift arm/hand to walker.  Baseline: Caregiver assist or uses LUE.  Trace elbow/shoulder movement Goal status: IN Progress 11/19/22 - Pt reports "sometimes" placing RUE on walker without LUE assistance though positioning requires full body compensatory movements. Pt primarily continues to use LUE.  5.  Patient will be assisted to don/doff appropriate splints/positioning devices to minimize subluxation and minimize risk of RUE/hand contracture. Baseline: None place Goal status: MET  11/19/22 - Pt reports wearing splint at least 2-3 hours per day, not currently wearing at night d/t discomfort.    LONG TERM GOALS: Target date: 01/28/23  Patient will demonstrate updated RUE HEP with visual schedule and images for proper execution and frequency. Baseline: New to outpatient OT.  Self reported difficulty with motivation at home. Goal status: IN Progress  2.  Patient will demonstrate at least 10 lbs RUE grip strength as needed to hold objects for manipulation by LUE. Baseline: Right 4.3 lbs Goal status: IN Progress 12/03/22 - Average: Right 5.8 lbs Left 38.8 lbs  3.  Patient will be able to get to EOB with no physical assistance to toilet with BSC as needed. Baseline: Assistance to get to EOB from caregivers. Goal status: MET 11/19/22 - Pt continues to use urinal in bed. Pt reports improved ability to ind transition to EOB and standing.   4.  Patient will be able to prepare simple food items with appropriate AE (small crock pot, microwave, toaster oven etc) Baseline: Caregiver/family perform meal prep  Goal status: IN Progress 11/12/22 - showed pt walker tray for home use  5.  Patient will report at  least two-point increase in average PSFS score or at least three-point increase  in a single activity score indicating functionally significant improvement given minimum detectable change. Baseline: 1.0 total score (See above for individual activity scores)  Goal status: MET and updated to continue to work towards additional 3 pt improvement s/p prog note  12/03/22 PSFS = 4.7   ASSESSMENT:  CLINICAL IMPRESSION: Pt continues to tolerate OT sessions well to facilitate AROM of RUE with good success with progression of UBE tolerance today. OTR continues to wants pt to explore a home NMES program for daily neuro-stimulation of muscles as he has the ability to initiate movement in most joints but motions are small and limited in repetitions.  Pt continues to benefit from skilled OT services in the outpatient setting to work on impairments as noted at eval and compensate for R hemiplegia to help pt return to max independence with ADLs as able.    PERFORMANCE DEFICITS: in functional skills including ADLs, coordination, dexterity, proprioception, sensation, tone, ROM, strength, flexibility, Fine motor control, Gross motor control, mobility, balance, endurance, decreased knowledge of use of DME, skin integrity, and UE functional use, cognitive skills including energy/drive, safety awareness, and sequencing, and psychosocial skills including coping strategies, environmental adaptation, and routines and behaviors.   IMPAIRMENTS: are limiting patient from ADLs, IADLs, rest and sleep, leisure, and social participation.   CO-MORBIDITIES: may have co-morbidities  that affects occupational performance. Patient will benefit from skilled OT to address above impairments and improve overall function.  REHAB POTENTIAL: Good   PLAN:  OT FREQUENCY: 1-2x/week  OT DURATION: additional 8 weeks  PLANNED INTERVENTIONS: self care/ADL training, therapeutic exercise, therapeutic activity, neuromuscular re-education, manual therapy, passive range of motion, balance training, functional mobility  training, aquatic therapy, splinting, electrical stimulation, patient/family education, cognitive remediation/compensation, visual/perceptual remediation/compensation, energy conservation, coping strategies training, and DME and/or AE instructions  RECOMMENDED OTHER SERVICES: PT treatment in place  CONSULTED AND AGREED WITH PLAN OF CARE: Patient and family member/caregiver  PLAN FOR NEXT SESSION:   Continue/establish home E-stim protocols and NRE for R UE AAROM (obtain personal unit?) Review and progress RUE treatment options Shoulder ROM, stability/weightbearing - hard to lift RUE to/above shoulder height - JB (combine all exercises to 1 access code) ADLS/IADLS - work in [employer], OT 01/07/2023, 3:38 PM

## 2023-01-12 ENCOUNTER — Ambulatory Visit: Payer: Medicare HMO | Admitting: Physical Therapy

## 2023-01-12 ENCOUNTER — Ambulatory Visit: Payer: Medicare HMO | Admitting: Occupational Therapy

## 2023-01-12 ENCOUNTER — Encounter: Payer: Self-pay | Admitting: Physical Therapy

## 2023-01-12 DIAGNOSIS — R2681 Unsteadiness on feet: Secondary | ICD-10-CM

## 2023-01-12 DIAGNOSIS — R29818 Other symptoms and signs involving the nervous system: Secondary | ICD-10-CM

## 2023-01-12 DIAGNOSIS — R278 Other lack of coordination: Secondary | ICD-10-CM

## 2023-01-12 DIAGNOSIS — I69351 Hemiplegia and hemiparesis following cerebral infarction affecting right dominant side: Secondary | ICD-10-CM | POA: Diagnosis not present

## 2023-01-12 DIAGNOSIS — S43111D Subluxation of right acromioclavicular joint, subsequent encounter: Secondary | ICD-10-CM | POA: Diagnosis not present

## 2023-01-12 DIAGNOSIS — M6281 Muscle weakness (generalized): Secondary | ICD-10-CM

## 2023-01-12 DIAGNOSIS — R2689 Other abnormalities of gait and mobility: Secondary | ICD-10-CM

## 2023-01-12 NOTE — Therapy (Signed)
OUTPATIENT OCCUPATIONAL THERAPY NEURO TREATMENT  Patient Name: Jimmy Valentine MRN: [id number] DOB:1950-09-25, 72 y.o., male Today's Date: 01/12/2023  PCP: Wanda Plump, MD REFERRING PROVIDER: Wanda Plump, MD  END OF SESSION:  OT End of Session - 01/12/23 1104     Visit Number 19    Number of Visits 32    Date for OT Re-Evaluation 02/05/23    Authorization Type [employer] MC 2024 Met VL: [place] Auth Not Reqd    OT Start Time 1105    OT Stop Time 1145    OT Time Calculation (min) 40 min    Activity Tolerance Patient tolerated treatment well    Behavior During Therapy WFL for tasks assessed/performed               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,had a MRI, was prescribed testosterone by endocrinology   Sleep apnea    does't use cpap   Sudden hearing loss, left 2017   idiopathic sensorineural hearing loss   Past Surgical History:  Procedure Laterality Date   TOTAL KNEE ARTHROPLASTY Right 2005   TOTAL KNEE ARTHROPLASTY  12/04/2011   LEFT-- TOTAL KNEE ARTHROPLASTY;  Surgeon: Eugenia Mcalpine, MD;  Location: [facility];  Service: Orthopedics;  Laterality: Left;   Patient Active Problem List   Diagnosis Date Noted   Hemiparesis affecting right side as late effect of cerebrovascular accident (CVA) (HCC) 09/04/2022   Gait disturbance, post-stroke 09/04/2022   Hyponatremia 07/18/2022   Aphasia due to acute cerebrovascular accident (CVA) (HCC) 07/15/2022   Acute ischemic left MCA stroke (HCC) 06/24/2022   Stroke (cerebrum) (HCC) 06/21/2022   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 10/18/2014   Elevated LFTs  04/13/2014   Diabetes (HCC) 09/03/2011   Gout 09/03/2011   OSA (obstructive sleep apnea) 04/22/2011   Annual physical exam 12/04/2010   Hypertension    Hyperlipidemia    Hypogonadism male    Erectile dysfunction     ONSET DATE: Referral date: 10/01/2022  Stroke 06/21/22  REFERRING DIAG: I69.351 (ICD-10-CM) - Hemiplegia and hemiparesis following cerebral infarction affecting right dominant side (HCC)  THERAPY DIAG:   Muscle weakness (generalized)  Other lack of coordination  Hemiplegia and hemiparesis following cerebral infarction affecting right dominant side (HCC)  Other symptoms and signs involving the nervous system  Rationale for Evaluation and Treatment: Rehabilitation  SUBJECTIVE:   SUBJECTIVE STATEMENT:  Pt reports he is not sure what an NMES unit is or remember using one in clinic.   Pt accompanied by: self  PERTINENT HISTORY:  Presented to [facility] ED on 06/21/2022 with sudden onset of right-sided weakness, dysarthria and facial drooping. MRI showed large MCA territory basal ganglia infarct with petechial hemorrhage with no hemorrhagic transformation or mass effect.   Inpatient Rehab: 06/24/2022 - 07/30/2022 Patient discharged at [facility] Assist level.  Patient's care partners able to provide the necessary physical assistance at discharge.   Recommendation:  Patient will benefit from ongoing skilled OT services in home health setting to continue to advance functional skills in the area of BADL, iADL, and Reduce care  partner burden. Equipment: Shower chair, commode, RW, R RW hand splint, R resting hand splint, LH sponge, Dycem   Home Health therapies 07/24 - 08/24     PRECAUTIONS: Fall  WEIGHT BEARING RESTRICTIONS: No  PAIN:  Are you having pain? No  FALLS: Has patient fallen in last 6 months? Yes. Number of falls 1x - fell on his bottom about a month prior to evaluation - got the walker stuck on the doorframe  11/26/22 - had spasms in the AM and fell in PM  trying to sit down but fell forwards instead 12/25/22 - turning in the kitchen - fell to Right side and bumped elbow  LIVING ENVIRONMENT: Lives with: lives alone and Caregiver 3-4 days/week x 12 hours, daughter, son and brother-in-law assist on other days Lives in: House/apartment Stairs: Yes: External: 3-5 steps; can reach both Has following equipment at home: Environmental consultant - 2 wheeled, Wheelchair (manual), Shower bench, bed side commode, Grab bars, and urinal  PLOF: Independent - still driving, played golf, retired Immunologist with lots traveling (driving)  PATIENT GOALS: To get my right side back working again  OBJECTIVE:   HAND DOMINANCE: Right (affected side)  ADLs: Overall ADLs: Min to mod assistance Transfers/ambulation related to ADLs: Supervision Eating: Gets help to cut food as needed, having to use L hand Grooming: Used regular razor yesterday for the first time UB Dressing: Needs help to get R arm in sleeve LB Dressing: Can pull pants up most of the time Toileting: Most of the time he is able to perform on his own; uses urinal at bedside at night Bathing: "50/50" with caregiver Tub Shower transfers: Gets help to get his leg into the tub Equipment: Transfer tub bench, Grab bars, bed side commode, and Long handled sponge  IADLs: Shopping: caregiver and family (daughter) Light housekeeping: Caregiver Meal Prep: Psychologist, counselling mobility: RW with hand orthosis & dycem + strap for R hand Medication management: Assistance from caregivers Financial management: Assistance from family Handwriting: unable with R UE; not assessed with L UE  MOBILITY STATUS: Needs Assist: Help to get his R UE/hand on the handle of his walker and Hx of falls  POSTURE COMMENTS:  rounded shoulders and forward head Sitting balance:  WFL in arm chair at table  ACTIVITY TOLERANCE: Activity tolerance: Limited due to R hemiplegia  FUNCTIONAL OUTCOME MEASURES: PSFS - 1.0  12/03/22 PSFS =  4.7  UPPER EXTREMITY ROM:    Active ROM Right eval Left eval  Shoulder flexion  [facility]  Shoulder abduction    Shoulder adduction    Shoulder extension min   Shoulder internal rotation    Shoulder external rotation    Elbow flexion min   Elbow extension    Wrist flexion min   Wrist extension    Wrist ulnar deviation    Wrist radial deviation    Wrist pronation    Wrist supination    (Blank rows = not tested)  UPPER EXTREMITY MMT:     MMT Right eval Left eval  Shoulder flexion  4/5  Shoulder abduction    Shoulder adduction    Shoulder extension    Shoulder internal rotation    Shoulder external rotation    Middle trapezius    Lower trapezius    Elbow flexion    Elbow extension    Wrist flexion    Wrist extension    Wrist ulnar deviation    Wrist radial deviation    Wrist pronation    Wrist supination    (  Blank rows = not tested)  HAND FUNCTION: Grip strength: Right: 3.9, 3.7, 5.2 lbs; Left: 34.8, 36.5, 37.9 lbs Average: Right 4.3 lbs Left 36.4 lbs  12/03/22: Right 6.1, 6.6, 4.8  Left 38.3, 39.4, 38.8 Average: Right 5.8 lbs Left 38.8 lbs  COORDINATION: Unable to perform R UE coordination tasks,   SENSATION: WFL  EDEMA: Slight RUE  MUSCLE TONE: RUE: Hypotonic and Flaccid  COGNITION: Overall cognitive status: Within functional limits for tasks assessed and h/o more severe aphasia and does self-report need to think about what he is trying to say etc  VISION: Subjective report: Eye Dr ~ 3 weeks Baseline vision: Wears glasses for reading only Visual history: cataracts  VISION ASSESSMENT: TBA  Patient is not having difficulty with activities due to vision.  PERCEPTION: Not tested  PRAXIS: Not tested  OBSERVATIONS: Pt ambulated into clinic w/RW and R hand orthosis. Patient was wearing his AFO which he stated he stopped wearing after he finished home health therapy.   TODAY'S TREATMENT:     Pt completed pick up of blocks with RUE and hand over  hand assist from right to left and vice versa.  Pt completed RUE stretches at edge of table to promote digit, wrist, elbow, and shoulder ROM.  OT placed Coban around walker handle and had pt grasp handle to take 1 loop around clinic. Pt requiring cues to keep hand from slipping forward or wrist rotating laterally. Pt provided with dowel for home practice.  PATIENT EDUCATION: Education details: RUE use Person educated: Patient Education method: Explanation, Demonstration, Tactile cues, and Verbal cues Education comprehension: verbalized understanding, returned demonstration, verbal cues required, tactile cues required, and needs further education  HOME EXERCISE PROGRAM:  10/20/22 - UE ROM (grip, forearm, elbow flex, shoulder shrugs) - no images provided only written suggestions. 10/27/2022: updated ROM HEP Access Code [email or web address] 11/03/22 - sleep positioning handout provided 12/16/22 - shoulder ROM Access Code: [email or web address]  12/22/22 - shoulder ROM Access Code: [email or web address]   GOALS: Goals reviewed with patient? Yes  SHORT TERM GOALS: Target date: 12/31/22  Patient will demonstrate updated RUE HEP with 25% verbal cues or less for proper execution.  Baseline: New to outpatient OT.  Self reported difficulty with motivation at home Goal status: MET 11/19/22 - Pt reported completing HEP. Pt demo'd understanding of exercises with approx. 40% v/c. 12/03/22 - splint 2x/day, stretching hand/wrist, shoulder elevations, squeezing ball   2.  Patient will demonstrate at least 8+ lbs RUE grip strength as needed to stabilize container on tabletop/hold large handled object.  Baseline: Right 4.3 lbs Goal status: IN progress 11/19/22 - Right: 5 lbs, with support from OT and pt's LUE for initial grasp positioning of RUE 12/03/22 - Average: Right 5.8 lbs Left 38.8 lbs  3.  Patient will be able to don T-shirt without physical assistance x 5 days in a row. Baseline: Caregiver assistance to get R UE in sleeve Goal  status: IN Progress 11/19/22 - Pt dons t-shirt on LUE and head, then caregiver provides assistance for RUE in sleeve.  4.  Patient will improve R UE AROM for shoulder elevation and elbow flexion to lift arm/hand to walker.  Baseline: Caregiver assist or uses LUE.  Trace elbow/shoulder movement Goal status: IN Progress 11/19/22 - Pt reports "sometimes" placing RUE on walker without LUE assistance though positioning requires full body compensatory movements. Pt primarily continues to use LUE.  5.  Patient will be assisted to don/doff appropriate splints/positioning devices to minimize subluxation and minimize risk of  RUE/hand contracture. Baseline: None place Goal status: MET  11/19/22 - Pt reports wearing splint at least 2-3 hours per day, not currently wearing at night d/t discomfort.    LONG TERM GOALS: Target date: 01/28/23  Patient will demonstrate updated RUE HEP with visual schedule and images for proper execution and frequency. Baseline: New to outpatient OT.  Self reported difficulty with motivation at home. Goal status: IN Progress  2.  Patient will demonstrate at least 10 lbs RUE grip strength as needed to hold objects for manipulation by LUE. Baseline: Right 4.3 lbs Goal status: IN Progress 12/03/22 - Average: Right 5.8 lbs Left 38.8 lbs  3.  Patient will be able to get to EOB with no physical assistance to toilet with BSC as needed. Baseline: Assistance to get to EOB from caregivers. Goal status: MET 11/19/22 - Pt continues to use urinal in bed. Pt reports improved ability to ind transition to EOB and standing.   4.  Patient will be able to prepare simple food items with appropriate AE (small crock pot, microwave, toaster oven etc) Baseline: Caregiver/family perform meal prep  Goal status: IN Progress 11/12/22 - showed pt walker tray for home use  5.  Patient will report at least two-point increase in average PSFS score or at least three-point increase in a single  activity score indicating functionally significant improvement given minimum detectable change. Baseline: 1.0 total score (See above for individual activity scores)  Goal status: MET and updated to continue to work towards additional 3 pt improvement s/p prog note  12/03/22 PSFS = 4.7   ASSESSMENT:  CLINICAL IMPRESSION: Pt requires cues to attend to R hand without use of walker splint indicating some neglect, which is further impaired by his lack of AROM and muscle weakness.   PERFORMANCE DEFICITS: in functional skills including ADLs, coordination, dexterity, proprioception, sensation, tone, ROM, strength, flexibility, Fine motor control, Gross motor control, mobility, balance, endurance, decreased knowledge of use of DME, skin integrity, and UE functional use, cognitive skills including energy/drive, safety awareness, and sequencing, and psychosocial skills including coping strategies, environmental adaptation, and routines and behaviors.   IMPAIRMENTS: are limiting patient from ADLs, IADLs, rest and sleep, leisure, and social participation.   CO-MORBIDITIES: may have co-morbidities  that affects occupational performance. Patient will benefit from skilled OT to address above impairments and improve overall function.  REHAB POTENTIAL: Good   PLAN:  OT FREQUENCY: 1-2x/week  OT DURATION: additional 8 weeks  PLANNED INTERVENTIONS: self care/ADL training, therapeutic exercise, therapeutic activity, neuromuscular re-education, manual therapy, passive range of motion, balance training, functional mobility training, aquatic therapy, splinting, electrical stimulation, patient/family education, cognitive remediation/compensation, visual/perceptual remediation/compensation, energy conservation, coping strategies training, and DME and/or AE instructions  RECOMMENDED OTHER SERVICES: PT treatment in place  CONSULTED AND AGREED WITH PLAN OF CARE: Patient and family member/caregiver  PLAN FOR NEXT  SESSION:   Continue/establish home E-stim protocols and NRE for R UE AAROM (obtain personal unit?) Dycem stretches Review and progress RUE treatment options Shoulder ROM, stability/weightbearing - hard to lift RUE to/above shoulder height - JB (combine all exercises to 1 access code) ADLS/IADLS - work in [employer], OT 01/12/2023, 2:06 PM

## 2023-01-12 NOTE — Therapy (Signed)
OUTPATIENT PHYSICAL THERAPY NEURO TREATMENT/20th VISIT PROGRESS NOTE   Patient Name: Jimmy Valentine MRN: [id number] DOB:[birth year], 72 y.o., male Today's Date: 01/12/2023    PCP: Wanda Plump, MD REFERRING PROVIDER: Wanda Plump, MD  PT progress note for Jimmy Valentine.  Reporting period 11/19/2022 to 01/12/2023  See Note below for Objective Data and Assessment of Progress/Goals  Thank you for the referral of this patient. Camille Bal, PT, DPT  END OF SESSION:  PT End of Session - 01/12/23 1154     Visit Number 20    Number of Visits 25   recert   Date for PT Re-Evaluation 01/26/23   to allow for delays in scheduling   Authorization Type [employer]    PT Start Time 1149    PT Stop Time 1234    PT Time Calculation (min) 45 min    Equipment Utilized During Treatment Gait belt    Activity Tolerance Patient tolerated treatment well    Behavior During Therapy WFL for tasks assessed/performed                   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,had a MRI, was prescribed testosterone by endocrinology   Sleep apnea    does't use cpap   Sudden hearing loss, left 2017   idiopathic sensorineural hearing loss   Past Surgical History:  Procedure Laterality Date   TOTAL KNEE ARTHROPLASTY Right 2005   TOTAL KNEE ARTHROPLASTY  12/04/2011   LEFT-- TOTAL KNEE ARTHROPLASTY;  Surgeon: Eugenia Mcalpine, MD;  Location: [facility];  Service: Orthopedics;  Laterality: Left;   Patient Active Problem List   Diagnosis Date Noted   Hemiparesis affecting right side as late effect of cerebrovascular accident (CVA) (HCC) 09/04/2022   Gait disturbance, post-stroke 09/04/2022   Hyponatremia 07/18/2022   Aphasia due to acute cerebrovascular accident  (CVA) (HCC) 07/15/2022   Acute ischemic left MCA stroke (HCC) 06/24/2022   Stroke (cerebrum) (HCC) 06/21/2022   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 10/18/2014   Elevated LFTs 04/13/2014   Diabetes (HCC) 09/03/2011   Gout 09/03/2011   OSA (obstructive sleep apnea) 04/22/2011   Annual physical exam 12/04/2010   Hypertension    Hyperlipidemia    Hypogonadism male    Erectile dysfunction     ONSET DATE: 09/23/2022 (referral)   REFERRING DIAG: F62.130 (ICD-10-CM) - Hemiparesis affecting right side as late effect of cerebrovascular accident (CVA) (HCC)  THERAPY DIAG:  Muscle weakness (generalized)  Other lack of coordination  Hemiplegia and hemiparesis following cerebral infarction affecting right dominant side (HCC)  Other symptoms and signs involving the nervous system  Unsteadiness on feet  Other abnormalities of gait and mobility  Rationale for Evaluation and Treatment: Rehabilitation  SUBJECTIVE:  SUBJECTIVE STATEMENT:  Pt reports his sciatica is still bothering him a bit in the mornings when he gets moving and then stops to rest.  No falls or acute changes to report.   Pt accompanied by:  Caregiver, [name] (in lobby)   PERTINENT HISTORY: L MCA CVA in May 2024   PAIN:  Are you having pain? Yes: NPRS scale: 5-6/10 Pain location: L sciatica  Pain description: nerve pain He is having none at current, but this is his pain when it flares.  PRECAUTIONS: Fall  WEIGHT BEARING RESTRICTIONS: No  FALLS: Has patient fallen in last 6 months? Yes. Number of falls 1  LIVING ENVIRONMENT: Lives with: lives alone Lives in: House/apartment Stairs:  Yes: External: 3 steps; on right going up, on left going up, and can reach both Has following equipment at home: Walker - 2 wheeled, Wheelchair (manual), shower chair, bed side commode, and AFO  PLOF: Independent  PATIENT GOALS: "Get back to where I can walk by myself without the walker and without assistance"   OBJECTIVE:   DIAGNOSTIC FINDINGS: MRI of brain on 06/22/22  IMPRESSION: 1. Positive for Left MCA territory lacunar type infarct tracking from the left corona radiata to the lentiform. Petechial hemorrhage (Heidelberg classification 1b: HI2). No malignant hemorrhagic transformation. No significant mass effect.   2. No other acute intracranial abnormality. 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  CT angio of head/neck on 06/21/22 IMPRESSION: 1. 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 The left MCA bi/trifurcation and left M2 branches remain patent.   2. Superimposed Severe intracranial and cervical ICA Atherosclerosis resulting in: - distal Right Vertebral Artery (V4) OCCLUSION. - distal Left Vertebral Artery Severe (V4) stenosis. - Right ICA origin RADIOGRAPHIC STRING SIGN stenosis due to calcified plaque. - Left ICA origin stenosis due to complex plaque estimated 65-70%. - Severe Left ICA supraclinoid stenosis due to calcified plaque.  COGNITION: Overall cognitive status: Within functional limits for tasks assessed   SENSATION: Pt denies numbness/tingling in RLE/RUE   COORDINATION: Heel to shin test: WNL on LLE, unable to perform hip flexion on RLE but able to achieve cross-legged position    MUSCLE TONE: Mild clonus in R foot that fatigues after 3s   POSTURE: rounded shoulders and forward head   LOWER EXTREMITY MMT:  Tested in  seated position   MMT Right Eval Left Eval  Hip flexion 2- 4+  Hip extension    Hip abduction 1 4+  Hip adduction 2 4+  Hip internal rotation    Hip external rotation    Knee flexion 3 4+  Knee extension 2 4+  Ankle dorsiflexion 4- 4+  Ankle plantarflexion    Ankle inversion    Ankle eversion    (Blank rows = not tested)  BED MOBILITY:  Has a rail but requires assistance (up to max) to roll and perform sit <>supine   TRANSFERS: Assistive device utilized: Environmental consultant - 2 wheeled  Sit to stand: SBA Stand to sit: SBA Pt kickstands RLE and leans to L side to perform    VITALS  There were no vitals filed for this visit.      TODAY'S TREATMENT:       w/ RW:  57.40 seconds CGA = 0.57 ft/sec OR 0.17 m/sec  Gait Training  Gait  pattern:  ER of RLE, step through pattern, decreased stride length, decreased hip/knee flexion- Right, decreased ankle dorsiflexion- Right, lateral hip instability, lateral lean- Left, narrow BOS, and poor foot clearance- Right Distance walked: 230 ft continuously Assistive device utilized: Quad cane large base and R AFO and built-in toe cap Level of assistance: CGA and Min A Comments: Mildly worsened ER and right toe drag with increased continuous distance.  He has no overt LOB and only 2 instances of placing cane too far away.  He requires some prolonged rest following task.  Tucked right hand into pocket on second round to protect right shoulder.  Pt requests BP checked following task.  Denies light-headedness, being hot, is not flushed, dizziness, or headache.  137/77, HR 91bpm.   RAMP:  Level of Assistance: Min A Assistive device utilized: Quad cane large base Ramp Comments: Min cues for large step clearance w/RLE throughout. PT providing intermittent blocking of R knee on descent to improve safety.  Light anterior pelvic translation facilitation provided throughout.                                                                 PATIENT  EDUCATION:  Education details: Continue HEP.   Person educated: Patient Education method: Explanation Education comprehension: verbalized understanding  HOME EXERCISE PROGRAM:  Access Code: [email or web address] URL: [email or web address] Date: 11/12/2022 Prepared by: Alethia Berthold Plaster  Exercises - Supine Figure 4 Piriformis Stretch  - 1 x daily - 7 x weekly - 3 sets - 1-3 minute hold - Seated Piriformis Stretch  - 1 x daily - 7 x weekly - 3 sets - 1-3 minute hold - Unilateral Supported Supine Butterfly Stretch  - 1 x daily - 7 x weekly - 3 sets - 2 minute hold  GOALS: Goals reviewed with patient? Yes  SHORT TERM GOALS: Target date: 10/28/2022    Pt will perform initial HEP w/min A from caregiver for improved strength, balance, transfers and gait.  Baseline: to be established  Goal status: IN PROGRESS  2.  Pt will improve gait velocity to at least 0.7 ft/s w/RW and CGA for improved gait efficiency and independence   Baseline: 0.49 ft/s w/RW and min A; 0.66 ft/s w/RW and SBA Goal status: PARTIALLY MET   3.  Pt will be able to don and doff R hand orthotic on RW w/CGA for improved independence and weight shift to R side  Baseline: max A; min A; SBA (10/3)  Goal status: MET   4.  Pt will ambulate greater than or equal to 300 feet on with LRAD and CGA for improved cardiovascular endurance and BLE strength.  Baseline: 213 ft with RW and min A (9/17); 195' w/RW and SBA  Goal status: PARTIALLY MET   5.  Berg to be assessed and LTG written  Baseline: 25/56 Goal status: MET  6.  Pt will perform supine > sit EOB w/min A or use of rail for improved independence and functional strength Baseline: mod-max A; min A (9/26) Goal status: MET  LONG TERM GOALS: Target date: 11/25/2022    Pt will be independent with final HEP for improved strength, balance, transfers and gait.  Baseline: "going alright" Goal status: In Progress  2.  Pt will improve gait velocity to at least  1.0  ft/s w/LRAD and SBA for improved gait efficiency and independence   Baseline: 0.49 ft/s w/RW and min A; 0.66 ft/s w/RW and SBA, 0.60 ft/s w/ RW and close SBA (12/01/22) Goal status: NOT MET  3.  Pt will ambulate greater than or equal to 400 feet on with LRAD and CGA for improved cardiovascular endurance and BLE strength.   Baseline: 213 ft with RW and min A (9/17), 195' w/RW and SBA, 208 with RW and CGA (12/01/22) Goal status: NOT MET  4.  Pt will improve Berg score to 34/56 for decreased fall risk  Baseline: 25/56, 35/56 (12/01/22) Goal status: MET  5.  Pt will perform bed mobility at CGA or SBA level for improved independence at home  Baseline: mod-max A, Mod I Goal status: MET    RECERTIFICATION SHORT TERM GOALS=  12/22/2022   1.  Pt will improve gait velocity to at least 0.75 ft/s w/ LRAD and SBA for improved gait efficiency and independence. Baseline: 0.60 ft/s w/ RW and close SBA (12/01/22); 0.71 ft/s w/RW and SBA Goal status: PARTIALLY MET    RECERTIFICATION LONG TERM GOALS:  Target date: 01/12/2023   Pt will improve gait velocity to at least 1.0 ft/s w/ LRAD and SBA for improved gait efficiency and independence. Baseline: 0.60 ft/s w/ RW and close SBA (12/01/22); 0.57 ft/sec w/ RW CGA (12/10) Goal status: NOT MET  2.  Pt will improve Berg score to 39/56 for decreased fall risk Baseline: 35/56 (12/01/22) Goal status: INITIAL  3.  Pt will ambulate greater than or equal to 400 feet on with LRAD and CGA for improved cardiovascular endurance and BLE strength.  Baseline: 208 FT with RW and CGA (12/01/22) Goal status: INITIAL  4.  Pt will safely transfer from the floor to standing with Mod A or less using proper body mechanics Baseline:  Goal status: INITIAL   ASSESSMENT:  CLINICAL IMPRESSION:  Assessed with pt continuing to ambulate at consistent pace of 0.57 ft/sec.  Continued to work with the [facility] to improve level distance tolerance and management  of inclines.  He benefits from ongoing PT to promote upright safety and improve functional mobility and independence.  Will continue per POC.  OBJECTIVE IMPAIRMENTS: Abnormal gait, decreased activity tolerance, decreased balance, decreased coordination, decreased mobility, difficulty walking, decreased strength, impaired tone, impaired UE functional use, improper body mechanics, and pain.   ACTIVITY LIMITATIONS: carrying, lifting, standing, squatting, stairs, transfers, bed mobility, bathing, toileting, dressing, self feeding, reach over head, hygiene/grooming, locomotion level, and caring for others  PARTICIPATION LIMITATIONS: meal prep, cleaning, laundry, medication management, personal finances, interpersonal relationship, driving, shopping, community activity, and yard work  PERSONAL FACTORS: Fitness, Past/current experiences, Transportation, and 1 comorbidity: CVA  are also affecting patient's functional outcome.   REHAB POTENTIAL: Good  CLINICAL DECISION MAKING: Evolving/moderate complexity  EVALUATION COMPLEXITY: Moderate  PLAN:  PT FREQUENCY: 2x/week  PT DURATION: 8 weeks (POC written for 10 weeks due to delay in scheduling) + 6 weeks (recert)  PLANNED INTERVENTIONS: Therapeutic exercises, Therapeutic activity, Neuromuscular re-education, Balance training, Gait training, Patient/Family education, Self Care, Joint mobilization, Stair training, Orthotic/Fit training, DME instructions, Aquatic Therapy, Dry Needling, Electrical stimulation, Manual therapy, and Re-evaluation  PLAN FOR NEXT SESSION: Gait w/SBQC vs LBQC, Continue TM training? Will need +2 if doing tall kneel activities. Prone- decrease assist amount? continue with Bioness to R HS and ant tib; RLE NMR, weight shift to R side, work on bed mobility and donning/doffing RUE from  hand orthotic and donning/doffing AFO more independently; can trial hemiwalker ambulation- sling for RUE?, stepping activities at parallel/ballet bars,  pt wants to do stair activities (step ups/taps?), floor transfer (+2)?  Camille Bal, PT, DPT 01/12/2023, 12:53 PM

## 2023-01-14 ENCOUNTER — Ambulatory Visit: Payer: Medicare HMO | Admitting: Physical Therapy

## 2023-01-14 ENCOUNTER — Ambulatory Visit: Payer: Medicare HMO | Admitting: Occupational Therapy

## 2023-01-14 VITALS — BP 135/91 | HR 76

## 2023-01-14 DIAGNOSIS — M6281 Muscle weakness (generalized): Secondary | ICD-10-CM | POA: Diagnosis not present

## 2023-01-14 DIAGNOSIS — I69351 Hemiplegia and hemiparesis following cerebral infarction affecting right dominant side: Secondary | ICD-10-CM

## 2023-01-14 DIAGNOSIS — R278 Other lack of coordination: Secondary | ICD-10-CM | POA: Diagnosis not present

## 2023-01-14 DIAGNOSIS — R2689 Other abnormalities of gait and mobility: Secondary | ICD-10-CM | POA: Diagnosis not present

## 2023-01-14 DIAGNOSIS — S43111D Subluxation of right acromioclavicular joint, subsequent encounter: Secondary | ICD-10-CM

## 2023-01-14 DIAGNOSIS — R29818 Other symptoms and signs involving the nervous system: Secondary | ICD-10-CM | POA: Diagnosis not present

## 2023-01-14 DIAGNOSIS — R2681 Unsteadiness on feet: Secondary | ICD-10-CM | POA: Diagnosis not present

## 2023-01-14 NOTE — Therapy (Signed)
OUTPATIENT OCCUPATIONAL THERAPY NEURO TREATMENT & PROGRESS NOTE  Patient Name: Jimmy Valentine MRN: [id number] DOB:[birth year], 72 y.o., male Today's Date: 01/14/2023  PCP: Wanda Plump, MD REFERRING PROVIDER: Wanda Plump, MD  END OF SESSION:  OT End of Session - 01/14/23 1056     Visit Number 20    Number of Visits 32    Date for OT Re-Evaluation 02/05/23    Authorization Type [employer] 2024 Met VL: [place] Auth Not Reqd    Progress Note Due on Visit 30    OT Start Time 1100    OT Stop Time 1145    OT Time Calculation (min) 45 min    Equipment Utilized During Treatment UBE    Activity Tolerance Patient tolerated treatment well    Behavior During Therapy WFL for tasks assessed/performed               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,had a MRI, was prescribed testosterone by endocrinology   Sleep apnea    does't use cpap   Sudden hearing loss, left 2017   idiopathic sensorineural hearing loss   Past Surgical History:  Procedure Laterality Date   TOTAL KNEE ARTHROPLASTY Right 2005   TOTAL KNEE ARTHROPLASTY  12/04/2011   LEFT-- TOTAL KNEE ARTHROPLASTY;  Surgeon: Eugenia Mcalpine, MD;  Location: [facility];  Service: Orthopedics;  Laterality: Left;   Patient Active Problem List   Diagnosis Date Noted   Hemiparesis affecting right side as late effect of cerebrovascular accident (CVA) (HCC) 09/04/2022   Gait disturbance, post-stroke 09/04/2022   Hyponatremia 07/18/2022   Aphasia due to acute cerebrovascular accident (CVA) (HCC) 07/15/2022   Acute ischemic left MCA stroke (HCC) 06/24/2022   Stroke (cerebrum) (HCC) 06/21/2022   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 10/18/2014   Elevated LFTs 04/13/2014   Diabetes (HCC) 09/03/2011   Gout 09/03/2011   OSA (obstructive sleep apnea) 04/22/2011   Annual physical exam 12/04/2010   Hypertension    Hyperlipidemia    Hypogonadism male    Erectile dysfunction     ONSET DATE: Referral date: 10/01/2022  Stroke 06/21/22  REFERRING DIAG: I69.351 (ICD-10-CM) - Hemiplegia and hemiparesis following cerebral infarction affecting right dominant side (HCC)  THERAPY DIAG:   Muscle weakness (generalized)  Other lack of coordination  Hemiplegia and hemiparesis following cerebral infarction affecting right dominant side (HCC)  Subluxation of acromioclavicular joint, right, subsequent encounter  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  He states he can get a t-shirt on himself and his shorts but still often gets assistance from caregivers for timeliness of task.  He is not participating in meal prep activities yet.     Pt accompanied by: self  PERTINENT HISTORY:  Presented to [facility] ED on 06/21/2022 with sudden onset of right-sided weakness, dysarthria and facial drooping. MRI showed large MCA territory basal ganglia infarct with petechial  hemorrhage with no hemorrhagic transformation or mass effect.   Inpatient Rehab: 06/24/2022 - 07/30/2022 Patient discharged at [facility] Assist level.  Patient's care partners able to provide the necessary physical assistance at discharge.   Recommendation:  Patient will benefit from ongoing skilled OT services in home health setting to continue to advance functional skills in the area of BADL, iADL, and Reduce care partner burden. Equipment: Shower chair, commode, RW, R RW hand splint, R resting hand  splint, LH sponge, Dycem   Home Health therapies 07/24 - 08/24     PRECAUTIONS: Fall  WEIGHT BEARING RESTRICTIONS: No  PAIN:  Are you having pain? No  FALLS: Has patient fallen in last 6 months? Yes. Number of falls 1x - fell on his bottom about a month prior to evaluation - got the walker stuck on the doorframe  11/26/22 - had spasms in the AM and fell in PM trying to sit down but fell forwards instead 12/25/22 - turning in the kitchen - fell to Right side and bumped elbow  LIVING ENVIRONMENT: Lives with: lives alone and Caregiver 3-4 days/week x 12 hours, daughter, son and brother-in-law assist on other days Lives in: House/apartment Stairs: Yes: External: 3-5 steps; can reach both Has following equipment at home: Environmental consultant - 2 wheeled, Wheelchair (manual), Shower bench, bed side commode, Grab bars, and urinal  PLOF: Independent - still driving, played golf, retired Immunologist with lots traveling (driving)  PATIENT GOALS: To get my right side back working again  OBJECTIVE:   HAND DOMINANCE: Right (affected side)  ADLs: Overall ADLs: Min to mod assistance Transfers/ambulation related to ADLs: Supervision Eating: Gets help to cut food as needed, having to use L hand Grooming: Used regular razor yesterday for the first time UB Dressing: Needs help to get R arm in sleeve LB Dressing: Can pull pants up most of the time Toileting: Most of the time he is able to perform on his own; uses urinal at bedside at night Bathing: "50/50" with caregiver Tub Shower transfers: Gets help to get his leg into the tub Equipment: Transfer tub bench, Grab bars, bed side commode, and Long handled sponge  IADLs: Shopping: caregiver and family (daughter) Light housekeeping: Caregiver Meal Prep: Psychologist, counselling mobility: RW with hand orthosis & dycem + strap for R hand Medication management: Assistance from caregivers Financial management: Assistance from family Handwriting: unable with  R UE; not assessed with L UE  MOBILITY STATUS: Needs Assist: Help to get his R UE/hand on the handle of his walker and Hx of falls  POSTURE COMMENTS:  rounded shoulders and forward head Sitting balance:  WFL in arm chair at table  ACTIVITY TOLERANCE: Activity tolerance: Limited due to R hemiplegia  FUNCTIONAL OUTCOME MEASURES: PSFS - 1.0  12/03/22 PSFS = 4.7 01/14/23 PSFS = 5.7  UPPER EXTREMITY ROM:    Active ROM Right eval Left eval  Shoulder flexion  [facility]  Shoulder abduction    Shoulder adduction    Shoulder extension min   Shoulder internal rotation    Shoulder external rotation    Elbow flexion min   Elbow extension    Wrist flexion min   Wrist extension    Wrist ulnar deviation    Wrist radial deviation    Wrist pronation    Wrist supination    (Blank rows = not tested)  UPPER EXTREMITY MMT:     MMT Right eval Left eval  Shoulder flexion  4/5  Shoulder abduction    Shoulder  adduction    Shoulder extension    Shoulder internal rotation    Shoulder external rotation    Middle trapezius    Lower trapezius    Elbow flexion    Elbow extension    Wrist flexion    Wrist extension    Wrist ulnar deviation    Wrist radial deviation    Wrist pronation    Wrist supination    (Blank rows = not tested)  HAND FUNCTION: Grip strength: Right: 3.9, 3.7, 5.2 lbs; Left: 34.8, 36.5, 37.9 lbs Average: Right 4.3 lbs Left 36.4 lbs  12/03/22: Right 6.1, 6.6, 4.8  Left 38.3, 39.4, 38.8 Average: Right 5.8 lbs Left 38.8 lbs  01/14/23: End of session and multiple UE activities Right: 5.0,4.6, 4.1, 4.6, 5.2 Average 4.7 lbs Left: 38.8 lbs  COORDINATION: Unable to perform R UE coordination tasks,   SENSATION: WFL  EDEMA: Slight RUE  MUSCLE TONE: RUE: Hypotonic and Flaccid  COGNITION: Overall cognitive status: Within functional limits for tasks assessed and h/o more severe aphasia and does self-report need to think about what he is trying to say  etc  VISION: Subjective report: Eye Dr ~ 3 weeks Baseline vision: Wears glasses for reading only Visual history: cataracts  VISION ASSESSMENT: TBA  Patient is not having difficulty with activities due to vision.  PERCEPTION: Not tested  PRAXIS: Not tested  OBSERVATIONS: Pt ambulated into clinic w/RW and R hand orthosis. Patient was wearing his AFO which he stated he stopped wearing after he finished home health therapy.   TODAY'S TREATMENT:     Therapeutic Exercise: Pt completed arm bike in sitting for 12 minutes today (up from 10 minutes last week) with average RPM above 30 and near 35 consistently at end of activity (up from 20 last week) for endurance, ROM, and strengthening of affected extremity. Pt pedaling forward x 5 minutes and backwards x 5 minutes and then forward again x 2 minutes with minor adjustments in seat height to improve comfort of R UE. No cues today to maintain stability with respect to trunk and good consistent grasp maintained with use of Active hand to support RUE without need to reposition limb.                                   Neuromuscular Re-ed: Neuromuscular re-education provided, including specific visual, tactile and verbal cues and physical assistance to stimulate the neuromuscular system and promote functional use of R UE for weightbearing activities at table top with arms extended as well as elbows flexed over a pillow.   Pt engaged in leaning forward in standing to weight bear through extended hand or forearms with work to weight shift side to side, improved stability in R elbow and shoulder with help to keep elbow extended and guidance to approximate shoulder. At end of session, pt had grip strength retested with not much change from previous tests ~ 5-6 lbs with results Right: 5.0,4.6, 4.1, 4.6, 5.2 Average 4.7 lbs PATIENT EDUCATION: Education details: RUE NRE Person educated: Patient Education method: Explanation, Demonstration, Tactile cues, and  Verbal cues Education comprehension: verbalized understanding, returned demonstration, verbal cues required, tactile cues required, and needs further education  HOME EXERCISE PROGRAM:  10/20/22 - UE ROM (grip, forearm, elbow flex, shoulder shrugs) - no images provided only written suggestions. 10/27/2022: updated ROM HEP Access Code [email or web address] 11/03/22 - sleep positioning handout provided 12/16/22 - shoulder ROM Access Code: [email or web address]  12/22/22 - shoulder ROM Access Code: [email or web address]   GOALS: Goals reviewed with patient? Yes  SHORT TERM GOALS: Target date: 12/31/22  Patient will demonstrate updated RUE HEP with 25% verbal cues or less for proper execution.  Baseline: New to outpatient OT.  Self reported difficulty with motivation at home Goal status: MET 11/19/22 - Pt reported completing HEP. Pt demo'd understanding of exercises with approx. 40% v/c. 12/03/22 - splint 2x/day, stretching hand/wrist, shoulder elevations, squeezing ball   2.  Patient will demonstrate at least 8+ lbs RUE grip strength as needed to stabilize container on tabletop/hold large handled object.  Baseline: Right 4.3 lbs Goal status: IN progress 11/19/22 - Right: 5 lbs, with support from OT and pt's LUE for initial grasp positioning of RUE 12/03/22 - Average: Right 5.8 lbs Left 38.8 lbs  3.  Patient will be able to don T-shirt without physical assistance x 5 days in a row. Baseline: Caregiver assistance to get R UE in sleeve Goal status: IN Progress 11/19/22 - Pt dons t-shirt on LUE and head, then caregiver provides assistance for RUE in sleeve. 01/14/23 - Can don t-shirt daily - aware of getting it over his elbow but may get occasional help from caregiver.  4.  Patient will improve R UE AROM for shoulder elevation and elbow flexion to lift arm/hand to walker.  Baseline: Caregiver assist or uses LUE.  Trace elbow/shoulder movement Goal status: IN Progress 11/19/22 - Pt reports "sometimes" placing RUE on walker without  LUE assistance though positioning requires full body compensatory movements. Pt primarily continues to use LUE. 12/12 - reminder  5.  Patient will be assisted to don/doff appropriate splints/positioning devices to minimize subluxation and minimize risk of RUE/hand contracture. Baseline: None place Goal status: MET  11/19/22 - Pt reports wearing splint at least 2-3 hours per day, not currently wearing at night d/t discomfort.    LONG TERM GOALS: Target date: 01/28/23  Patient will demonstrate updated RUE HEP with visual schedule and images for proper execution and frequency. Baseline: New to outpatient OT.  Self reported difficulty with motivation at home. Goal status: IN Progress  2.  Patient will demonstrate at least 10 lbs RUE grip strength as needed to hold objects for manipulation by LUE. Baseline: Right 4.3 lbs Goal status: IN Progress 12/03/22 - Average: Right 5.8 lbs Left 38.8 lbs  3.  Patient will be able to get to EOB with no physical assistance to toilet with BSC as needed. Baseline: Assistance to get to EOB from caregivers. Goal status: MET 11/19/22 - Pt continues to use urinal in bed. Pt reports improved ability to ind transition to EOB and standing.   4.  Patient will be able to prepare simple food items with appropriate AE (small crock pot, microwave, toaster oven etc) Baseline: Caregiver/family perform meal prep  Goal status: IN Progress 11/12/22 - showed pt walker tray for home use 01/14/23 - still assisted by caregivers  5.  Patient will report at least two-point increase in average PSFS score or at least three-point increase in a single activity score indicating functionally significant improvement given minimum detectable change. Baseline: 1.0 total score (See above for individual activity scores)  Goal status: MET and updated to continue to work towards additional 3 pt improvement s/p prog note  12/03/22 PSFS = 4.7   ASSESSMENT:  CLINICAL IMPRESSION: Pt  continues to tolerate OT sessions well to facilitate AROM of RUE with good success with progression of UBE tolerance today. OTR continues to wants  pt to explore a home NMES program for daily neuro-stimulation of muscles as he has the ability to initiate movement in most joints but motions are small and limited in repetitions.  Pt continues to benefit from skilled OT services in the outpatient setting to work on impairments as noted at eval and compensate for R hemiplegia to help pt return to max independence with ADLs as able.    This 20th progress note is for dates: 10/15/22 to 01/14/2023. Pt has met 2/5 STGs and 1/5 LTGs. Pt making progress towards goals as expected and continues to benefit from skilled OT services in the outpatient setting to work towards remaining goals or until max rehab potential is met.   PERFORMANCE DEFICITS: in functional skills including ADLs, coordination, dexterity, proprioception, sensation, tone, ROM, strength, flexibility, Fine motor control, Gross motor control, mobility, balance, endurance, decreased knowledge of use of DME, skin integrity, and UE functional use, cognitive skills including energy/drive, safety awareness, and sequencing, and psychosocial skills including coping strategies, environmental adaptation, and routines and behaviors.   IMPAIRMENTS: are limiting patient from ADLs, IADLs, rest and sleep, leisure, and social participation.   CO-MORBIDITIES: may have co-morbidities  that affects occupational performance. Patient will benefit from skilled OT to address above impairments and improve overall function.  REHAB POTENTIAL: Good   PLAN:  OT FREQUENCY: 1-2x/week  OT DURATION: additional 8 weeks  PLANNED INTERVENTIONS: self care/ADL training, therapeutic exercise, therapeutic activity, neuromuscular re-education, manual therapy, passive range of motion, balance training, functional mobility training, aquatic therapy, splinting, electrical stimulation,  patient/family education, cognitive remediation/compensation, visual/perceptual remediation/compensation, energy conservation, coping strategies training, and DME and/or AE instructions  RECOMMENDED OTHER SERVICES: PT treatment in place  CONSULTED AND AGREED WITH PLAN OF CARE: Patient and family member/caregiver  PLAN FOR NEXT SESSION:   Continue/establish home E-stim protocols and NRE for R UE AAROM (obtain personal unit?) Review and progress RUE tx/HEP options Shoulder ROM, stability/weightbearing - hard to lift RUE to/above shoulder height  ADLS/IADLS - work in [employer], OT 01/14/2023, 1:31 PM

## 2023-01-14 NOTE — Therapy (Signed)
OUTPATIENT PHYSICAL THERAPY NEURO TREATMENT   Patient Name: Jimmy Valentine MRN: [id number] DOB:1950-03-06, 72 y.o., male Today's Date: 01/14/2023    PCP: Wanda Plump, MD REFERRING PROVIDER: Wanda Plump, MD   END OF SESSION:  PT End of Session - 01/14/23 1024     Visit Number 21    Number of Visits 25   recert   Date for PT Re-Evaluation 01/26/23   to allow for delays in scheduling   Authorization Type [employer]    PT Start Time 1019    PT Stop Time 1059    PT Time Calculation (min) 40 min    Equipment Utilized During Treatment Gait belt    Activity Tolerance Patient tolerated treatment well    Behavior During Therapy [facility] for tasks assessed/performed                    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,had a MRI, was prescribed testosterone by endocrinology   Sleep apnea    does't use cpap   Sudden hearing loss, left 2017   idiopathic sensorineural hearing loss   Past Surgical History:  Procedure Laterality Date   TOTAL KNEE ARTHROPLASTY Right 2005   TOTAL KNEE ARTHROPLASTY  12/04/2011   LEFT-- TOTAL KNEE ARTHROPLASTY;  Surgeon: Eugenia Mcalpine, MD;  Location: [facility];  Service: Orthopedics;  Laterality: Left;   Patient Active Problem List   Diagnosis Date Noted   Hemiparesis affecting right side as late effect of cerebrovascular accident (CVA) (HCC) 09/04/2022   Gait disturbance, post-stroke 09/04/2022   Hyponatremia 07/18/2022   Aphasia due to acute cerebrovascular accident (CVA) (HCC) 07/15/2022   Acute ischemic left MCA stroke (HCC) 06/24/2022   Stroke (cerebrum) (HCC) 06/21/2022   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 10/18/2014   Elevated LFTs 04/13/2014   Diabetes (HCC) 09/03/2011   Gout 09/03/2011   OSA (obstructive sleep apnea) 04/22/2011   Annual physical exam 12/04/2010   Hypertension    Hyperlipidemia    Hypogonadism male    Erectile dysfunction     ONSET DATE: 09/23/2022 (referral)   REFERRING DIAG: D63.875 (ICD-10-CM) - Hemiparesis affecting right side as late effect of cerebrovascular accident (CVA) (HCC)  THERAPY DIAG:  Muscle weakness (generalized)  Hemiplegia and hemiparesis following cerebral infarction affecting right dominant side (HCC)  Unsteadiness on feet  Other abnormalities of gait and mobility  Rationale for Evaluation and Treatment: Rehabilitation  SUBJECTIVE:  SUBJECTIVE STATEMENT:  Pt reports his sciatica is still bothering him. Rating pain as a 4-5/10 today. Has been trying all his stretches at home to ease the pain. Denies falls or acute changes.   Pt accompanied by:  Caregiver, [name] (in lobby)   PERTINENT HISTORY: L MCA CVA in May 2024   PAIN:  Are you having pain? Yes: NPRS scale: 4-5/10 Pain location: L sciatica  Pain description: nerve pain He is having none at current, but this is his pain when it flares.  PRECAUTIONS: Fall  WEIGHT BEARING RESTRICTIONS: No  FALLS: Has patient fallen in last 6 months? Yes. Number of falls 1  LIVING ENVIRONMENT: Lives with: lives alone Lives in: House/apartment Stairs: Yes: External: 3 steps; on right going up, on left going up, and can reach both Has following equipment at home: Walker - 2 wheeled, Wheelchair (manual), shower chair, bed side commode, and AFO  PLOF: Independent  PATIENT GOALS: "Get back to where I can walk by myself without the walker and without assistance"    OBJECTIVE:   DIAGNOSTIC FINDINGS: MRI of brain on 06/22/22  IMPRESSION: 1. Positive for Left MCA territory lacunar type infarct tracking from the left corona radiata to the lentiform. Petechial hemorrhage (Heidelberg classification 1b: HI2). No malignant hemorrhagic transformation. No significant mass effect.   2. No other acute intracranial abnormality. 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  CT angio of head/neck on 06/21/22 IMPRESSION: 1. 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 The left MCA bi/trifurcation and left M2 branches remain patent.   2. Superimposed Severe intracranial and cervical ICA Atherosclerosis resulting in: - distal Right Vertebral Artery (V4) OCCLUSION. - distal Left Vertebral Artery Severe (V4) stenosis. - Right ICA origin RADIOGRAPHIC STRING SIGN stenosis due to calcified plaque. - Left ICA origin stenosis due to complex plaque estimated 65-70%. - Severe Left ICA supraclinoid stenosis due to calcified plaque.  COGNITION: Overall cognitive status: Within functional limits for tasks assessed   SENSATION: Pt denies numbness/tingling in RLE/RUE   COORDINATION: Heel to shin test: WNL on LLE, unable to perform hip flexion on RLE but able to achieve cross-legged position    MUSCLE TONE: Mild clonus in R foot that fatigues after 3s   POSTURE: rounded shoulders and forward head   LOWER EXTREMITY MMT:  Tested in seated position   MMT Right Eval Left Eval  Hip flexion 2- 4+  Hip extension    Hip abduction 1 4+  Hip adduction 2 4+  Hip internal rotation    Hip external rotation    Knee flexion 3 4+  Knee extension 2 4+  Ankle dorsiflexion 4- 4+  Ankle plantarflexion    Ankle inversion    Ankle eversion     (Blank rows = not tested)  BED MOBILITY:  Has a rail but requires assistance (up to max) to roll and perform sit <>supine   TRANSFERS: Assistive device utilized: Environmental consultant - 2 wheeled  Sit to stand: SBA Stand to sit: SBA Pt kickstands RLE and leans to L side to perform    VITALS  Vitals:   01/14/23 1026  BP: (!) 135/91  Pulse: 76        TODAY'S TREATMENT:       Ther Act  Assessed vitals (see above) and within limits for  therapy   Floor Recovery: Patient educated in floor recovery this visit using teach-back for injury assessment and sequencing of task in clinic setting.  Discussion of transfer of skills to variable scenarios outside the clinic. 2 therapists present for safety and assistance.  Pt performed sit > stand > turn to face mat > half kneel > tall kneel to floor w/mod A for RUE and RLE management and min cues for proper sequencing.  Once in tall kneel, attempted to perform half kneel w/LLE fwd and push up to stand x2, requiring max A due to instability on RUE and decreased weight shift to R side. Once in half kneel w/LLE fwd, pt unable to shift to L side to push-up to stand.  Reset and trialed tall > half kneel w/RLE fwd instead and pt able to push up to stand w/mod A. Pt required mod A to achieve full hip extension once standing and min A to fully turn to sit on mat.    With bench on mat table and airex placed on mat to cushion knees, pt achieved tall kneel position on mat w/mod A x2 and performed mini squats w/BUE support on bench, x10 reps. Min A for RUE stabilization on bench and min multimodal cues for glute activation. Pt able to achieve full tall kneel position this date and reported feeling good stretch in R quad. Mod A X2 to perform tall > standing off mat due to R hemibody weakness and fatigue.   Gait Training  Gait pattern: step through pattern, decreased step length- Right, decreased stride length, decreased hip/knee flexion- Right, decreased ankle  dorsiflexion- Right, circumduction- Right, Right hip hike, lateral lean- Left, trunk flexed, and poor foot clearance- Right Distance walked: 230'  Assistive device utilized: Quad cane large base and R AFO Level of assistance: CGA and Min A  Comments: Pt ambulated around gym w/turns to L side. Min tactile cues applied to anterior R shoulder to facilitate upright posture and placed R hand in pants pocket to protect RUE. Pt became fatigued after 150' and demonstrated increased forward flexed posture w/increased incidence of catching R foot as well as placing cane too far forward. One minor LOB when pt looking around gym, requiring min A to stabilize.                                            PATIENT EDUCATION:  Education details: Continue HEP.   Person educated: Patient Education method: Explanation Education comprehension: verbalized understanding  HOME EXERCISE PROGRAM:  Access Code: [email or web address] URL: [email or web address] Date: 11/12/2022 Prepared by: Alethia Berthold Victoriah Wilds  Exercises - Supine Figure 4 Piriformis Stretch  - 1 x daily - 7 x weekly - 3 sets - 1-3 minute hold - Seated Piriformis Stretch  - 1 x daily - 7 x weekly - 3 sets - 1-3 minute hold - Unilateral Supported Supine Butterfly Stretch  - 1 x daily - 7 x weekly - 3 sets - 2 minute hold  GOALS: Goals reviewed with patient? Yes  SHORT TERM GOALS: Target date: 10/28/2022    Pt will perform initial HEP w/min A from caregiver for improved strength, balance, transfers and gait.  Baseline: to be established  Goal status: IN PROGRESS  2.  Pt will improve gait velocity to at least 0.7 ft/s w/RW and CGA for improved gait efficiency and independence   Baseline: 0.49 ft/s w/RW and  min A; 0.66 ft/s w/RW and SBA Goal status: PARTIALLY MET   3.  Pt will be able to don and doff R hand orthotic on RW w/CGA for improved independence and weight shift to R side  Baseline: max A; min A; SBA (10/3)  Goal status: MET   4.  Pt  will ambulate greater than or equal to 300 feet on with LRAD and CGA for improved cardiovascular endurance and BLE strength.  Baseline: 213 ft with RW and min A (9/17); 195' w/RW and SBA  Goal status: PARTIALLY MET   5.  Berg to be assessed and LTG written  Baseline: 25/56 Goal status: MET  6.  Pt will perform supine > sit EOB w/min A or use of rail for improved independence and functional strength Baseline: mod-max A; min A (9/26) Goal status: MET  LONG TERM GOALS: Target date: 11/25/2022    Pt will be independent with final HEP for improved strength, balance, transfers and gait.  Baseline: "going alright" Goal status: In Progress  2.  Pt will improve gait velocity to at least 1.0 ft/s w/LRAD and SBA for improved gait efficiency and independence   Baseline: 0.49 ft/s w/RW and min A; 0.66 ft/s w/RW and SBA, 0.60 ft/s w/ RW and close SBA (12/01/22) Goal status: NOT MET  3.  Pt will ambulate greater than or equal to 400 feet on with LRAD and CGA for improved cardiovascular endurance and BLE strength.   Baseline: 213 ft with RW and min A (9/17), 195' w/RW and SBA, 208 with RW and CGA (12/01/22) Goal status: NOT MET  4.  Pt will improve Berg score to 34/56 for decreased fall risk  Baseline: 25/56, 35/56 (12/01/22) Goal status: MET  5.  Pt will perform bed mobility at CGA or SBA level for improved independence at home  Baseline: mod-max A, Mod I Goal status: MET    RECERTIFICATION SHORT TERM GOALS=  12/22/2022   1.  Pt will improve gait velocity to at least 0.75 ft/s w/ LRAD and SBA for improved gait efficiency and independence. Baseline: 0.60 ft/s w/ RW and close SBA (12/01/22); 0.71 ft/s w/RW and SBA Goal status: PARTIALLY MET    RECERTIFICATION LONG TERM GOALS:  Target date: 01/26/2023 (updated to match cert date)    Pt will improve gait velocity to at least 1.0 ft/s w/ LRAD and SBA for improved gait efficiency and independence. Baseline: 0.60 ft/s w/ RW and  close SBA (12/01/22); 0.57 ft/sec w/ RW CGA (12/10) Goal status: NOT MET  2.  Pt will improve Berg score to 39/56 for decreased fall risk Baseline: 35/56 (12/01/22) Goal status: INITIAL  3.  Pt will ambulate greater than or equal to 400 feet on with LRAD and CGA for improved cardiovascular endurance and BLE strength.  Baseline: 208 FT with RW and CGA (12/01/22) Goal status: INITIAL  4.  Pt will safely transfer from the floor to standing with Mod A or less using proper body mechanics Baseline:  Goal status: MET   ASSESSMENT:  CLINICAL IMPRESSION:  Emphasis of skilled PT session on practicing fall recovery, improved hip extension strength and continued gait training w/LBQC. Pt required mod A x2 to safely perform floor transfer and required most assistance w/lateral weight shift to R side and management of R hemibody. However, once pt positioned properly, pt able to push to stand w/min A. Pt ambulated 230' w/LBQC without rest break this date and does demonstrate inconsistencies w/cane placement and forward truncal  lean w/fatigue, but has improved his endurance and step clearance w/use of cane. Continue POC.   OBJECTIVE IMPAIRMENTS: Abnormal gait, decreased activity tolerance, decreased balance, decreased coordination, decreased mobility, difficulty walking, decreased strength, impaired tone, impaired UE functional use, improper body mechanics, and pain.   ACTIVITY LIMITATIONS: carrying, lifting, standing, squatting, stairs, transfers, bed mobility, bathing, toileting, dressing, self feeding, reach over head, hygiene/grooming, locomotion level, and caring for others  PARTICIPATION LIMITATIONS: meal prep, cleaning, laundry, medication management, personal finances, interpersonal relationship, driving, shopping, community activity, and yard work  PERSONAL FACTORS: Fitness, Past/current experiences, Transportation, and 1 comorbidity: CVA  are also affecting patient's functional outcome.    REHAB POTENTIAL: Good  CLINICAL DECISION MAKING: Evolving/moderate complexity  EVALUATION COMPLEXITY: Moderate  PLAN:  PT FREQUENCY: 2x/week  PT DURATION: 8 weeks (POC written for 10 weeks due to delay in scheduling) + 6 weeks (recert)  PLANNED INTERVENTIONS: Therapeutic exercises, Therapeutic activity, Neuromuscular re-education, Balance training, Gait training, Patient/Family education, Self Care, Joint mobilization, Stair training, Orthotic/Fit training, DME instructions, Aquatic Therapy, Dry Needling, Electrical stimulation, Manual therapy, and Re-evaluation  PLAN FOR NEXT SESSION: Gait w/SBQC vs LBQC, Continue TM training? Will need +2 if doing tall kneel activities. Prone- decrease assist amount? continue with Bioness to R HS and ant tib; RLE NMR, weight shift to R side, work on bed mobility and donning/doffing RUE from hand orthotic and donning/doffing AFO more independently; can trial hemiwalker ambulation- sling for RUE?, stepping activities at parallel/ballet bars, pt wants to do stair activities (step ups/taps?)  Eadie Repetto E Fatemah Pourciau, PT, DPT 01/14/2023, 11:06 AM

## 2023-01-19 ENCOUNTER — Ambulatory Visit: Payer: Medicare HMO | Admitting: Occupational Therapy

## 2023-01-19 ENCOUNTER — Encounter: Payer: Self-pay | Admitting: Physical Therapy

## 2023-01-19 ENCOUNTER — Ambulatory Visit: Payer: Medicare HMO | Admitting: Physical Therapy

## 2023-01-19 DIAGNOSIS — R29818 Other symptoms and signs involving the nervous system: Secondary | ICD-10-CM | POA: Diagnosis not present

## 2023-01-19 DIAGNOSIS — I69351 Hemiplegia and hemiparesis following cerebral infarction affecting right dominant side: Secondary | ICD-10-CM

## 2023-01-19 DIAGNOSIS — R2681 Unsteadiness on feet: Secondary | ICD-10-CM | POA: Diagnosis not present

## 2023-01-19 DIAGNOSIS — M6281 Muscle weakness (generalized): Secondary | ICD-10-CM | POA: Diagnosis not present

## 2023-01-19 DIAGNOSIS — S43111D Subluxation of right acromioclavicular joint, subsequent encounter: Secondary | ICD-10-CM | POA: Diagnosis not present

## 2023-01-19 DIAGNOSIS — R278 Other lack of coordination: Secondary | ICD-10-CM | POA: Diagnosis not present

## 2023-01-19 DIAGNOSIS — R2689 Other abnormalities of gait and mobility: Secondary | ICD-10-CM | POA: Diagnosis not present

## 2023-01-19 NOTE — Therapy (Signed)
OUTPATIENT PHYSICAL THERAPY NEURO TREATMENT   Patient Name: Jimmy Valentine MRN: [id number] DOB:[birth year], 72 y.o., male Today's Date: 01/19/2023    PCP: Wanda Plump, MD REFERRING PROVIDER: Wanda Plump, MD   END OF SESSION:  PT End of Session - 01/19/23 1159     Visit Number 22    Number of Visits 25   recert   Date for PT Re-Evaluation 01/26/23   to allow for delays in scheduling   Authorization Type [employer]    PT Start Time 1155   PT w/ eval prior   PT Stop Time 1234    PT Time Calculation (min) 39 min    Equipment Utilized During Treatment Gait belt    Activity Tolerance Patient tolerated treatment well    Behavior During Therapy [facility] for tasks assessed/performed              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,had a MRI, was prescribed testosterone by endocrinology   Sleep apnea    does't use cpap   Sudden hearing loss, left 2017   idiopathic sensorineural hearing loss   Past Surgical History:  Procedure Laterality Date   TOTAL KNEE ARTHROPLASTY Right 2005   TOTAL KNEE ARTHROPLASTY  12/04/2011   LEFT-- TOTAL KNEE ARTHROPLASTY;  Surgeon: Eugenia Mcalpine, MD;  Location: [facility];  Service: Orthopedics;  Laterality: Left;   Patient Active Problem List   Diagnosis Date Noted   Hemiparesis affecting right side as late effect of cerebrovascular accident (CVA) (HCC) 09/04/2022   Gait disturbance, post-stroke 09/04/2022   Hyponatremia 07/18/2022   Aphasia due to acute cerebrovascular accident (CVA) (HCC) 07/15/2022   Acute ischemic left MCA stroke (HCC) 06/24/2022   Stroke (cerebrum) (HCC) 06/21/2022   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 10/18/2014   Elevated LFTs 04/13/2014   Diabetes (HCC) 09/03/2011   Gout 09/03/2011   OSA (obstructive sleep apnea) 04/22/2011   Annual physical exam 12/04/2010   Hypertension    Hyperlipidemia    Hypogonadism male    Erectile dysfunction     ONSET DATE: 09/23/2022 (referral)   REFERRING DIAG: W29.562 (ICD-10-CM) - Hemiparesis affecting right side as late effect of cerebrovascular accident (CVA) (HCC)  THERAPY DIAG:  Muscle weakness (generalized)  Other lack of coordination  Hemiplegia and hemiparesis following cerebral infarction affecting right dominant side (HCC)  Other symptoms and signs involving the nervous system  Rationale for Evaluation and Treatment: Rehabilitation  SUBJECTIVE:  SUBJECTIVE STATEMENT:  Pt reports his sciatica is bothering him, but he has already stretched today. Rating pain as a 4-5/10 today.  Denies falls or acute changes.   Pt accompanied by:  Caregiver, [name] (in lobby)   PERTINENT HISTORY: L MCA CVA in May 2024   PAIN:  Are you having pain? Yes: NPRS scale: 4-5/10 Pain location: L sciatica  Pain description: nerve pain; intermittent  PRECAUTIONS: Fall  WEIGHT BEARING RESTRICTIONS: No  FALLS: Has patient fallen in last 6 months? Yes. Number of falls 1  LIVING ENVIRONMENT: Lives with: lives alone Lives in: House/apartment Stairs: Yes: External: 3 steps; on right going up, on left going up, and can reach both Has following equipment at home: Walker - 2 wheeled, Wheelchair (manual), shower chair, bed side commode, and AFO  PLOF: Independent  PATIENT GOALS: "Get back to where I can walk by myself without the walker and without assistance"   OBJECTIVE:   DIAGNOSTIC FINDINGS: MRI of brain on  06/22/22  IMPRESSION: 1. Positive for Left MCA territory lacunar type infarct tracking from the left corona radiata to the lentiform. Petechial hemorrhage (Heidelberg classification 1b: HI2). No malignant hemorrhagic transformation. No significant mass effect.   2. No other acute intracranial abnormality. 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  CT angio of head/neck on 06/21/22 IMPRESSION: 1. 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 The left MCA bi/trifurcation and left M2 branches remain patent.   2. Superimposed Severe intracranial and cervical ICA Atherosclerosis resulting in: - distal Right Vertebral Artery (V4) OCCLUSION. - distal Left Vertebral Artery Severe (V4) stenosis. - Right ICA origin RADIOGRAPHIC STRING SIGN stenosis due to calcified plaque. - Left ICA origin stenosis due to complex plaque estimated 65-70%. - Severe Left ICA supraclinoid stenosis due to calcified plaque.  COGNITION: Overall cognitive status: Within functional limits for tasks assessed   SENSATION: Pt denies numbness/tingling in RLE/RUE   COORDINATION: Heel to shin test: WNL on LLE, unable to perform hip flexion on RLE but able to achieve cross-legged position    MUSCLE TONE: Mild clonus in R foot that fatigues after 3s   POSTURE: rounded shoulders and forward head   LOWER EXTREMITY MMT:  Tested in seated position   MMT Right Eval Left Eval  Hip flexion 2- 4+  Hip extension    Hip abduction 1 4+  Hip adduction 2 4+  Hip internal rotation    Hip external rotation    Knee flexion 3 4+  Knee extension 2 4+  Ankle dorsiflexion 4- 4+  Ankle plantarflexion    Ankle inversion    Ankle eversion    (Blank rows = not tested)  BED MOBILITY:  Has a rail but  requires assistance (up to max) to roll and perform sit <>supine   TRANSFERS: Assistive device utilized: Environmental consultant - 2 wheeled  Sit to stand: SBA Stand to sit: SBA Pt kickstands RLE and leans to L side to perform    VITALS  There were no vitals filed for this visit.       TODAY'S TREATMENT:        Gait Training  Gait pattern: step through pattern, decreased step length- Right, decreased stride length, decreased hip/knee flexion- Right, decreased ankle dorsiflexion- Right, circumduction- Right, Right hip hike, lateral lean- Left, trunk flexed, and  poor foot clearance- Right Distance walked: 100'  Assistive device utilized: Quad cane small base and R AFO Level of assistance: CGA and Min A  Comments: Pt ambulated 1 lap around front office loop of gym before visible difficulty advancing RLE, PT providing light facilitation for anterior translation of pelvis and using external visual cues for attempts at improved width of BOS w/ R foot placement w/ minimal success.    -Gumdrops taps alternating LE using BUE progressed to RUE support for R weight shifting and SLS -LUE Squigz crossbody retrieval and placement overhead on outlined target > right oriented target with widened stance for improved weight shift and quad engagement                                          PATIENT EDUCATION:  Education details: Continue HEP.   Person educated: Patient Education method: Explanation Education comprehension: verbalized understanding  HOME EXERCISE PROGRAM:  Access Code: [email or web address] URL: [email or web address] Date: 11/12/2022 Prepared by: Alethia Berthold Plaster  Exercises - Supine Figure 4 Piriformis Stretch  - 1 x daily - 7 x weekly - 3 sets - 1-3 minute hold - Seated Piriformis Stretch  - 1 x daily - 7 x weekly - 3 sets - 1-3 minute hold - Unilateral Supported Supine Butterfly Stretch  - 1 x daily - 7 x weekly - 3 sets - 2 minute hold  GOALS: Goals reviewed with patient? Yes  SHORT  TERM GOALS: Target date: 10/28/2022    Pt will perform initial HEP w/min A from caregiver for improved strength, balance, transfers and gait.  Baseline: to be established  Goal status: IN PROGRESS  2.  Pt will improve gait velocity to at least 0.7 ft/s w/RW and CGA for improved gait efficiency and independence   Baseline: 0.49 ft/s w/RW and min A; 0.66 ft/s w/RW and SBA Goal status: PARTIALLY MET   3.  Pt will be able to don and doff R hand orthotic on RW w/CGA for improved independence and weight shift to R side  Baseline: max A; min A; SBA (10/3)  Goal status: MET   4.  Pt will ambulate greater than or equal to 300 feet on with LRAD and CGA for improved cardiovascular endurance and BLE strength.  Baseline: 213 ft with RW and min A (9/17); 195' w/RW and SBA  Goal status: PARTIALLY MET   5.  Berg to be assessed and LTG written  Baseline: 25/56 Goal status: MET  6.  Pt will perform supine > sit EOB w/min A or use of rail for improved independence and functional strength Baseline: mod-max A; min A (9/26) Goal status: MET  LONG TERM GOALS: Target date: 11/25/2022    Pt will be independent with final HEP for improved strength, balance, transfers and gait.  Baseline: "going alright" Goal status: In Progress  2.  Pt will improve gait velocity to at least 1.0 ft/s w/LRAD and SBA for improved gait efficiency and independence   Baseline: 0.49 ft/s w/RW and min A; 0.66 ft/s w/RW and SBA, 0.60 ft/s w/ RW and close SBA (12/01/22) Goal status: NOT MET  3.  Pt will ambulate greater than or equal to 400 feet on with LRAD and CGA for improved cardiovascular endurance and BLE strength.   Baseline: 213 ft with RW and min A (9/17), 195' w/RW and SBA, 208 with RW and CGA (12/01/22) Goal  status: NOT MET  4.  Pt will improve Berg score to 34/56 for decreased fall risk  Baseline: 25/56, 35/56 (12/01/22) Goal status: MET  5.  Pt will perform bed mobility at CGA or SBA level for  improved independence at home  Baseline: mod-max A, Mod I Goal status: MET    RECERTIFICATION SHORT TERM GOALS=  12/22/2022   1.  Pt will improve gait velocity to at least 0.75 ft/s w/ LRAD and SBA for improved gait efficiency and independence. Baseline: 0.60 ft/s w/ RW and close SBA (12/01/22); 0.71 ft/s w/RW and SBA Goal status: PARTIALLY MET    RECERTIFICATION LONG TERM GOALS:  Target date: 01/26/2023 (updated to match cert date)    Pt will improve gait velocity to at least 1.0 ft/s w/ LRAD and SBA for improved gait efficiency and independence. Baseline: 0.60 ft/s w/ RW and close SBA (12/01/22); 0.57 ft/sec w/ RW CGA (12/10) Goal status: NOT MET  2.  Pt will improve Berg score to 39/56 for decreased fall risk Baseline: 35/56 (12/01/22) Goal status: INITIAL  3.  Pt will ambulate greater than or equal to 400 feet on with LRAD and CGA for improved cardiovascular endurance and BLE strength.  Baseline: 208 FT with RW and CGA (12/01/22) Goal status: INITIAL  4.  Pt will safely transfer from the floor to standing with Mod A or less using proper body mechanics Baseline:  Goal status: MET   ASSESSMENT:  CLINICAL IMPRESSION:  Time spent working on gait training with NBQC w/ pt appearing somewhat stiffer than session seen with this therapist.  He was fatigued with weight shifting tasks this visit visible with changes in gait mechanics on exit from clinic.  He continues to benefit from skilled PT to improve mechanics of gait and safety with upright mobility as able.  Will continue per POC until pt reaches functional maximum in this setting.  OBJECTIVE IMPAIRMENTS: Abnormal gait, decreased activity tolerance, decreased balance, decreased coordination, decreased mobility, difficulty walking, decreased strength, impaired tone, impaired UE functional use, improper body mechanics, and pain.   ACTIVITY LIMITATIONS: carrying, lifting, standing, squatting, stairs, transfers, bed mobility,  bathing, toileting, dressing, self feeding, reach over head, hygiene/grooming, locomotion level, and caring for others  PARTICIPATION LIMITATIONS: meal prep, cleaning, laundry, medication management, personal finances, interpersonal relationship, driving, shopping, community activity, and yard work  PERSONAL FACTORS: Fitness, Past/current experiences, Transportation, and 1 comorbidity: CVA  are also affecting patient's functional outcome.   REHAB POTENTIAL: Good  CLINICAL DECISION MAKING: Evolving/moderate complexity  EVALUATION COMPLEXITY: Moderate  PLAN:  PT FREQUENCY: 2x/week  PT DURATION: 8 weeks (POC written for 10 weeks due to delay in scheduling) + 6 weeks (recert)  PLANNED INTERVENTIONS: Therapeutic exercises, Therapeutic activity, Neuromuscular re-education, Balance training, Gait training, Patient/Family education, Self Care, Joint mobilization, Stair training, Orthotic/Fit training, DME instructions, Aquatic Therapy, Dry Needling, Electrical stimulation, Manual therapy, and Re-evaluation  PLAN FOR NEXT SESSION: Gait w/SBQC vs LBQC, Continue TM training? Will need +2 if doing tall kneel activities. Prone- decrease assist amount? continue with Bioness to R HS and ant tib; RLE NMR, weight shift to R side, work on bed mobility and donning/doffing RUE from hand orthotic and donning/doffing AFO more independently; can trial hemiwalker ambulation- sling for RUE?, stepping activities at parallel/ballet bars, pt wants to do stair activities (step ups/taps?)  Camille Bal, PT, DPT 01/19/2023, 3:30 PM

## 2023-01-19 NOTE — Therapy (Signed)
OUTPATIENT OCCUPATIONAL THERAPY NEURO TREATMENT   Patient Name: Jimmy Valentine MRN: [id number] DOB:02/28/1950, 72 y.o., male Today's Date: 01/19/2023  PCP: Wanda Plump, MD REFERRING PROVIDER: Wanda Plump, MD  END OF SESSION:  OT End of Session - 01/19/23 1109     Visit Number 21    Number of Visits 32    Date for OT Re-Evaluation 02/05/23    Authorization Type [employer] [facility] 2024 Met VL: [place] Auth Not Reqd    Progress Note Due on Visit 30    OT Start Time 1108    OT Stop Time 1146    OT Time Calculation (min) 38 min    Activity Tolerance Patient tolerated treatment well    Behavior During Therapy WFL for tasks assessed/performed               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,had a MRI, was prescribed testosterone by endocrinology   Sleep apnea    does't use cpap   Sudden hearing loss, left 2017   idiopathic sensorineural hearing loss   Past Surgical History:  Procedure Laterality Date   TOTAL KNEE ARTHROPLASTY Right 2005   TOTAL KNEE ARTHROPLASTY  12/04/2011   LEFT-- TOTAL KNEE ARTHROPLASTY;  Surgeon: Eugenia Mcalpine, MD;  Location: [facility];  Service: Orthopedics;  Laterality: Left;   Patient Active Problem List   Diagnosis Date Noted   Hemiparesis affecting right side as late effect of cerebrovascular accident (CVA) (HCC) 09/04/2022   Gait disturbance, post-stroke 09/04/2022   Hyponatremia 07/18/2022   Aphasia due to acute cerebrovascular accident (CVA) (HCC) 07/15/2022   Acute ischemic left MCA stroke (HCC) 06/24/2022   Stroke (cerebrum) (HCC) 06/21/2022   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  10/18/2014   Elevated LFTs 04/13/2014   Diabetes (HCC) 09/03/2011   Gout 09/03/2011   OSA (obstructive sleep apnea) 04/22/2011   Annual physical exam 12/04/2010   Hypertension    Hyperlipidemia    Hypogonadism male    Erectile dysfunction     ONSET DATE: Referral date: 10/01/2022  Stroke 06/21/22  REFERRING DIAG: I69.351 (ICD-10-CM) - Hemiplegia and hemiparesis following cerebral infarction affecting right dominant side (HCC)  THERAPY DIAG:   Muscle weakness (generalized)  Other lack of coordination  Hemiplegia and hemiparesis following cerebral infarction affecting right dominant side (HCC)  Other symptoms and signs involving the nervous system  Rationale for Evaluation and Treatment: Rehabilitation  SUBJECTIVE:   SUBJECTIVE STATEMENT:  Pt reports he is interested in NMES unit. His daughter might be able to buy it for him.   Pt accompanied by: self  PERTINENT HISTORY:  Presented to [facility] ED on 06/21/2022 with sudden onset of right-sided weakness, dysarthria and facial drooping. MRI showed large MCA territory basal ganglia infarct with petechial hemorrhage with no hemorrhagic transformation or mass effect.   Inpatient Rehab: 06/24/2022 - 07/30/2022 Patient discharged at [facility] Assist level.  Patient's care partners able to provide the necessary physical assistance at discharge.   Recommendation:  Patient will benefit from ongoing skilled OT services in home health setting to continue to advance  functional skills in the area of BADL, iADL, and Reduce care partner burden. Equipment: Shower chair, commode, RW, R RW hand splint, R resting hand splint, LH sponge, Dycem   Home Health therapies 07/24 - 08/24     PRECAUTIONS: Fall  WEIGHT BEARING RESTRICTIONS: No  PAIN:  Are you having pain? No  FALLS: Has patient fallen in last 6 months? Yes. Number of falls 1x - fell on his bottom about a month prior to evaluation - got the walker stuck on the doorframe  11/26/22 - had  spasms in the AM and fell in PM trying to sit down but fell forwards instead 12/25/22 - turning in the kitchen - fell to Right side and bumped elbow  LIVING ENVIRONMENT: Lives with: lives alone and Caregiver 3-4 days/week x 12 hours, daughter, son and brother-in-law assist on other days Lives in: House/apartment Stairs: Yes: External: 3-5 steps; can reach both Has following equipment at home: Environmental consultant - 2 wheeled, Wheelchair (manual), Shower bench, bed side commode, Grab bars, and urinal  PLOF: Independent - still driving, played golf, retired Immunologist with lots traveling (driving)  PATIENT GOALS: To get my right side back working again  OBJECTIVE:   HAND DOMINANCE: Right (affected side)  ADLs: Overall ADLs: Min to mod assistance Transfers/ambulation related to ADLs: Supervision Eating: Gets help to cut food as needed, having to use L hand Grooming: Used regular razor yesterday for the first time UB Dressing: Needs help to get R arm in sleeve LB Dressing: Can pull pants up most of the time Toileting: Most of the time he is able to perform on his own; uses urinal at bedside at night Bathing: "50/50" with caregiver Tub Shower transfers: Gets help to get his leg into the tub Equipment: Transfer tub bench, Grab bars, bed side commode, and Long handled sponge  IADLs: Shopping: caregiver and family (daughter) Light housekeeping: Caregiver Meal Prep: Psychologist, counselling mobility: RW with hand orthosis & dycem + strap for R hand Medication management: Assistance from caregivers Financial management: Assistance from family Handwriting: unable with R UE; not assessed with L UE  MOBILITY STATUS: Needs Assist: Help to get his R UE/hand on the handle of his walker and Hx of falls  POSTURE COMMENTS:  rounded shoulders and forward head Sitting balance:  WFL in arm chair at table  ACTIVITY TOLERANCE: Activity tolerance: Limited due to R hemiplegia  FUNCTIONAL OUTCOME  MEASURES: PSFS - 1.0  12/03/22 PSFS = 4.7 01/14/23 PSFS = 5.7  UPPER EXTREMITY ROM:    Active ROM Right eval Left eval  Shoulder flexion  [facility]  Shoulder abduction    Shoulder adduction    Shoulder extension min   Shoulder internal rotation    Shoulder external rotation    Elbow flexion min   Elbow extension    Wrist flexion min   Wrist extension    Wrist ulnar deviation    Wrist radial deviation    Wrist pronation    Wrist supination    (Blank rows = not tested)  UPPER EXTREMITY MMT:     MMT Right eval Left eval  Shoulder flexion  4/5  Shoulder abduction    Shoulder adduction    Shoulder extension    Shoulder internal rotation    Shoulder external rotation    Middle trapezius    Lower trapezius    Elbow flexion    Elbow extension    Wrist flexion    Wrist extension    Wrist ulnar deviation  Wrist radial deviation    Wrist pronation    Wrist supination    (Blank rows = not tested)  HAND FUNCTION: Grip strength: Right: 3.9, 3.7, 5.2 lbs; Left: 34.8, 36.5, 37.9 lbs Average: Right 4.3 lbs Left 36.4 lbs  12/03/22: Right 6.1, 6.6, 4.8  Left 38.3, 39.4, 38.8 Average: Right 5.8 lbs Left 38.8 lbs  01/14/23: End of session and multiple UE activities Right: 5.0,4.6, 4.1, 4.6, 5.2 Average 4.7 lbs Left: 38.8 lbs  COORDINATION: Unable to perform R UE coordination tasks,   SENSATION: WFL  EDEMA: Slight RUE  MUSCLE TONE: RUE: Hypotonic and Flaccid  COGNITION: Overall cognitive status: Within functional limits for tasks assessed and h/o more severe aphasia and does self-report need to think about what he is trying to say etc  VISION: Subjective report: Eye Dr ~ 3 weeks Baseline vision: Wears glasses for reading only Visual history: cataracts  VISION ASSESSMENT: TBA  Patient is not having difficulty with activities due to vision.  PERCEPTION: Not tested  PRAXIS: Not tested  OBSERVATIONS: Pt ambulated into clinic w/RW and R hand orthosis. Patient  was wearing his AFO which he stated he stopped wearing after he finished home health therapy.   TODAY'S TREATMENT:    OT educated patient on use of NMES unit as noted in patient instructions. Therapist educated patient on application for R wrist and digit extension. Patient able to teach back information provided and tolerated supervised application to promote improved AROM and pain management. Therapist assisting patient with marking of electrode placement on arm to help with carryover outside of therapy.  Modalities Skin Integrity Assessment:  NMES utilized: No redness, irritation or skin integrity concerns were noted before, during and/or after use. Patient was informed of risks and benefits to treatment today. Skin integrity prior to treatment: Intact. Skin integrity after treatment: Intact.  PATIENT EDUCATION: Education details: RUE [employer] Person educated: Patient Education method: Explanation, Demonstration, Tactile cues, and Verbal cues Education comprehension: verbalized understanding, returned demonstration, verbal cues required, tactile cues required, and needs further education  HOME EXERCISE PROGRAM:  10/20/22 - UE ROM (grip, forearm, elbow flex, shoulder shrugs) - no images provided only written suggestions. 10/27/2022: updated ROM HEP Access Code [email or web address] 11/03/22 - sleep positioning handout provided 12/16/22 - shoulder ROM Access Code: [email or web address]  12/22/22 - shoulder ROM Access Code: [email or web address]   GOALS: Goals reviewed with patient? Yes  SHORT TERM GOALS: Target date: 12/31/22  Patient will demonstrate updated RUE HEP with 25% verbal cues or less for proper execution.  Baseline: New to outpatient OT.  Self reported difficulty with motivation at home Goal status: MET 11/19/22 - Pt reported completing HEP. Pt demo'd understanding of exercises with approx. 40% v/c. 12/03/22 - splint 2x/day, stretching hand/wrist, shoulder elevations, squeezing ball   2.  Patient will demonstrate at  least 8+ lbs RUE grip strength as needed to stabilize container on tabletop/hold large handled object.  Baseline: Right 4.3 lbs Goal status: IN progress 11/19/22 - Right: 5 lbs, with support from OT and pt's LUE for initial grasp positioning of RUE 12/03/22 - Average: Right 5.8 lbs Left 38.8 lbs  3.  Patient will be able to don T-shirt without physical assistance x 5 days in a row. Baseline: Caregiver assistance to get R UE in sleeve Goal status: IN Progress 11/19/22 - Pt dons t-shirt on LUE and head, then caregiver provides assistance for RUE in sleeve. 01/14/23 - Can don t-shirt daily - aware of getting it over his elbow but may get  occasional help from caregiver.  4.  Patient will improve R UE AROM for shoulder elevation and elbow flexion to lift arm/hand to walker.  Baseline: Caregiver assist or uses LUE.  Trace elbow/shoulder movement Goal status: IN Progress 11/19/22 - Pt reports "sometimes" placing RUE on walker without LUE assistance though positioning requires full body compensatory movements. Pt primarily continues to use LUE. 12/12 - reminder  5.  Patient will be assisted to don/doff appropriate splints/positioning devices to minimize subluxation and minimize risk of RUE/hand contracture. Baseline: None place Goal status: MET  11/19/22 - Pt reports wearing splint at least 2-3 hours per day, not currently wearing at night d/t discomfort.    LONG TERM GOALS: Target date: 01/28/23  Patient will demonstrate updated RUE HEP with visual schedule and images for proper execution and frequency. Baseline: New to outpatient OT.  Self reported difficulty with motivation at home. Goal status: IN Progress  2.  Patient will demonstrate at least 10 lbs RUE grip strength as needed to hold objects for manipulation by LUE. Baseline: Right 4.3 lbs Goal status: IN Progress 12/03/22 - Average: Right 5.8 lbs Left 38.8 lbs  3.  Patient will be able to get to EOB with no physical assistance to  toilet with BSC as needed. Baseline: Assistance to get to EOB from caregivers. Goal status: MET 11/19/22 - Pt continues to use urinal in bed. Pt reports improved ability to ind transition to EOB and standing.   4.  Patient will be able to prepare simple food items with appropriate AE (small crock pot, microwave, toaster oven etc) Baseline: Caregiver/family perform meal prep  Goal status: IN Progress 11/12/22 - showed pt walker tray for home use 01/14/23 - still assisted by caregivers  5.  Patient will report at least two-point increase in average PSFS score or at least three-point increase in a single activity score indicating functionally significant improvement given minimum detectable change. Baseline: 1.0 total score (See above for individual activity scores)  Goal status: MET and updated to continue to work towards additional 3 pt improvement s/p prog note  12/03/22 PSFS = 4.7   ASSESSMENT:  CLINICAL IMPRESSION: Pt demonstrates good response to use of NMES unit and could benefit from home unit to promote improved AROM and functional use.   PERFORMANCE DEFICITS: in functional skills including ADLs, coordination, dexterity, proprioception, sensation, tone, ROM, strength, flexibility, Fine motor control, Gross motor control, mobility, balance, endurance, decreased knowledge of use of DME, skin integrity, and UE functional use, cognitive skills including energy/drive, safety awareness, and sequencing, and psychosocial skills including coping strategies, environmental adaptation, and routines and behaviors.   IMPAIRMENTS: are limiting patient from ADLs, IADLs, rest and sleep, leisure, and social participation.   CO-MORBIDITIES: may have co-morbidities  that affects occupational performance. Patient will benefit from skilled OT to address above impairments and improve overall function.  REHAB POTENTIAL: Good   PLAN:  OT FREQUENCY: 1-2x/week  OT DURATION: additional 8  weeks  PLANNED INTERVENTIONS: self care/ADL training, therapeutic exercise, therapeutic activity, neuromuscular re-education, manual therapy, passive range of motion, balance training, functional mobility training, aquatic therapy, splinting, electrical stimulation, patient/family education, cognitive remediation/compensation, visual/perceptual remediation/compensation, energy conservation, coping strategies training, and DME and/or AE instructions  RECOMMENDED OTHER SERVICES: PT treatment in place  CONSULTED AND AGREED WITH PLAN OF CARE: Patient and family member/caregiver  PLAN FOR NEXT SESSION:   Continue/establish home E-stim protocols and NRE for R UE AAROM (obtain personal unit?) Review and progress RUE tx/HEP options Shoulder ROM, stability/weightbearing -  hard to lift RUE to/above shoulder height  ADLS/IADLS - work in [employer], [place] 01/19/2023, 4:31 PM

## 2023-01-21 ENCOUNTER — Ambulatory Visit: Payer: Medicare HMO | Admitting: Physical Therapy

## 2023-01-21 ENCOUNTER — Encounter: Payer: Self-pay | Admitting: Physical Therapy

## 2023-01-21 ENCOUNTER — Ambulatory Visit: Payer: Medicare HMO | Admitting: Occupational Therapy

## 2023-01-21 NOTE — Therapy (Signed)
[facility] [address] [place], [place], [address] Phone: [phone]   Fax:  [phone]  Patient Details  Name: Jimmy Valentine MRN: [id number] Date of Birth: 20-Jun-1950 Referring Provider:  No ref. provider found  Encounter Date: 01/21/2023  Called pt and left VM regarding no-show to scheduled PT appointment today. Reminded pt of OT appointment today at 11:45am and to call office if he is unable to make appointment. Also informed pt of next scheduled PT appointment on 12/24.   Jill Alexanders Breland Elders, PT, DPT 01/21/2023, 11:19 AM  [facility] [address] [place], [place], [address] Phone: [phone]   Fax:  [phone]

## 2023-01-26 ENCOUNTER — Ambulatory Visit: Payer: Medicare HMO | Admitting: Occupational Therapy

## 2023-01-26 ENCOUNTER — Ambulatory Visit: Payer: Medicare HMO

## 2023-01-28 ENCOUNTER — Telehealth: Payer: Self-pay | Admitting: Occupational Therapy

## 2023-01-28 ENCOUNTER — Ambulatory Visit: Payer: Medicare HMO | Admitting: Physical Therapy

## 2023-01-28 ENCOUNTER — Ambulatory Visit: Payer: Medicare HMO | Admitting: Occupational Therapy

## 2023-01-28 NOTE — Telephone Encounter (Signed)
Pt no-showed for OT appointment today. Therefore, OT called pt's listed mobile phone number [phone]). OT left voicemail to pt reminding pt of next PT/OT appointments on 02/02/23. OT recommended to pt to call clinic office if he is unable to make appointments. OT provided contact information of clinic.

## 2023-02-01 ENCOUNTER — Telehealth: Payer: Self-pay

## 2023-02-01 NOTE — Telephone Encounter (Signed)
Pt passed away on 01/30/2023.

## 2023-02-02 ENCOUNTER — Ambulatory Visit: Payer: Medicare HMO | Admitting: Physical Therapy

## 2023-02-02 ENCOUNTER — Ambulatory Visit: Payer: Medicare HMO | Admitting: Occupational Therapy

## 2023-02-02 NOTE — Telephone Encounter (Signed)
I am sorry to hear

## 2023-02-03 DEATH — deceased

## 2023-02-04 ENCOUNTER — Encounter: Payer: Medicare HMO | Admitting: Occupational Therapy

## 2023-02-04 ENCOUNTER — Ambulatory Visit: Payer: Medicare HMO | Admitting: Physical Therapy

## 2023-02-05 ENCOUNTER — Ambulatory Visit: Payer: Medicare HMO | Admitting: Physical Medicine & Rehabilitation

## 2023-02-11 ENCOUNTER — Ambulatory Visit: Payer: Medicare HMO | Admitting: Physical Medicine & Rehabilitation

## 2023-02-12 ENCOUNTER — Ambulatory Visit: Payer: Medicare HMO | Admitting: Internal Medicine

## 2023-02-17 ENCOUNTER — Other Ambulatory Visit: Payer: Self-pay | Admitting: Internal Medicine

## 2023-05-06 ENCOUNTER — Ambulatory Visit: Payer: Medicare HMO | Admitting: Neurology
